# Patient Record
Sex: Male | Born: 1950 | Race: Black or African American | Hispanic: No | Marital: Single | Smoking: Never smoker
Health system: Southern US, Community
[De-identification: ages and names within clinical notes are randomized; demographics above are authoritative.]

## PROBLEM LIST (undated history)

## (undated) DIAGNOSIS — R350 Frequency of micturition: Secondary | ICD-10-CM

## (undated) DIAGNOSIS — R739 Hyperglycemia, unspecified: Secondary | ICD-10-CM

## (undated) DIAGNOSIS — H409 Unspecified glaucoma: Secondary | ICD-10-CM

## (undated) DIAGNOSIS — J690 Pneumonitis due to inhalation of food and vomit: Secondary | ICD-10-CM

## (undated) DIAGNOSIS — G4730 Sleep apnea, unspecified: Secondary | ICD-10-CM

## (undated) DIAGNOSIS — I10 Essential (primary) hypertension: Secondary | ICD-10-CM

## (undated) DIAGNOSIS — E785 Hyperlipidemia, unspecified: Secondary | ICD-10-CM

## (undated) DIAGNOSIS — Z8616 Personal history of COVID-19: Secondary | ICD-10-CM

## (undated) DIAGNOSIS — D649 Anemia, unspecified: Secondary | ICD-10-CM

## (undated) DIAGNOSIS — R972 Elevated prostate specific antigen [PSA]: Secondary | ICD-10-CM

## (undated) HISTORY — DX: Frequency of micturition: R35.0

## (undated) HISTORY — DX: Hyperlipidemia, unspecified: E78.5

## (undated) HISTORY — DX: Sleep apnea, unspecified: G47.30

## (undated) HISTORY — DX: Unspecified glaucoma: H40.9

## (undated) HISTORY — DX: Elevated prostate specific antigen (PSA): R97.20

## (undated) HISTORY — PX: COLONOSCOPY: SHX174

## (undated) HISTORY — DX: Hyperglycemia, unspecified: R73.9

## (undated) HISTORY — DX: Anemia, unspecified: D64.9

## (undated) HISTORY — DX: Essential (primary) hypertension: I10

## (undated) HISTORY — DX: Personal history of COVID-19: Z86.16

---

## 1997-12-28 ENCOUNTER — Other Ambulatory Visit: Admission: RE | Admit: 1997-12-28 | Discharge: 1997-12-28 | Payer: Self-pay | Admitting: Gastroenterology

## 2004-05-27 ENCOUNTER — Ambulatory Visit: Payer: Self-pay | Admitting: Family Medicine

## 2005-07-20 ENCOUNTER — Ambulatory Visit: Payer: Self-pay | Admitting: Internal Medicine

## 2005-07-21 ENCOUNTER — Ambulatory Visit: Payer: Self-pay | Admitting: Internal Medicine

## 2006-03-08 ENCOUNTER — Ambulatory Visit: Payer: Self-pay | Admitting: Family Medicine

## 2006-03-08 LAB — CONVERTED CEMR LAB: PSA: 0.79 ng/mL

## 2006-06-15 ENCOUNTER — Ambulatory Visit: Payer: Self-pay | Admitting: Gastroenterology

## 2006-11-23 ENCOUNTER — Encounter: Payer: Self-pay | Admitting: Family Medicine

## 2006-11-23 DIAGNOSIS — F79 Unspecified intellectual disabilities: Secondary | ICD-10-CM

## 2006-11-26 ENCOUNTER — Encounter (INDEPENDENT_AMBULATORY_CARE_PROVIDER_SITE_OTHER): Payer: Self-pay | Admitting: *Deleted

## 2006-12-10 ENCOUNTER — Ambulatory Visit: Payer: Self-pay | Admitting: Family Medicine

## 2006-12-10 DIAGNOSIS — I10 Essential (primary) hypertension: Secondary | ICD-10-CM | POA: Insufficient documentation

## 2006-12-11 LAB — CONVERTED CEMR LAB
ALT: 22 units/L (ref 0–53)
AST: 29 units/L (ref 0–37)
Cholesterol: 186 mg/dL (ref 0–200)
HDL: 35.4 mg/dL — ABNORMAL LOW (ref >39.0)
Hgb A1c MFr Bld: 5.7 % (ref 4.6–6.0)
LDL Cholesterol: 133 mg/dL — ABNORMAL HIGH (ref 0–99)
Total CHOL/HDL Ratio: 5.3
Triglycerides: 87 mg/dL (ref 0–149)
VLDL: 17 mg/dL (ref 0–40)

## 2007-01-14 ENCOUNTER — Ambulatory Visit: Payer: Self-pay | Admitting: Gastroenterology

## 2007-01-21 ENCOUNTER — Ambulatory Visit: Payer: Self-pay | Admitting: Gastroenterology

## 2007-01-25 ENCOUNTER — Ambulatory Visit: Payer: Self-pay | Admitting: Family Medicine

## 2007-05-17 ENCOUNTER — Ambulatory Visit: Payer: Self-pay | Admitting: Family Medicine

## 2007-05-20 LAB — CONVERTED CEMR LAB
ALT: 15 units/L (ref 0–53)
AST: 19 units/L (ref 0–37)
Albumin: 3.7 g/dL (ref 3.5–5.2)
BUN: 9 mg/dL (ref 6–23)
CO2: 27 meq/L (ref 19–32)
Calcium: 9.1 mg/dL (ref 8.4–10.5)
Chloride: 101 meq/L (ref 96–112)
Cholesterol: 172 mg/dL (ref 0–200)
Creatinine, Ser: 0.9 mg/dL (ref 0.4–1.5)
GFR calc Af Amer: 112 mL/min
GFR calc non Af Amer: 92 mL/min
Glucose, Bld: 103 mg/dL — ABNORMAL HIGH (ref 70–99)
HDL: 38 mg/dL — ABNORMAL LOW (ref >39.0)
LDL Cholesterol: 121 mg/dL — ABNORMAL HIGH (ref 0–99)
Phosphorus: 3.3 mg/dL (ref 2.3–4.6)
Potassium: 4.2 meq/L (ref 3.5–5.1)
Sodium: 134 meq/L — ABNORMAL LOW (ref 135–145)
Total CHOL/HDL Ratio: 4.5
Triglycerides: 67 mg/dL (ref 0–149)
VLDL: 13 mg/dL (ref 0–40)

## 2007-11-04 ENCOUNTER — Encounter: Payer: Self-pay | Admitting: Family Medicine

## 2007-12-11 ENCOUNTER — Telehealth (INDEPENDENT_AMBULATORY_CARE_PROVIDER_SITE_OTHER): Payer: Self-pay | Admitting: *Deleted

## 2009-02-08 ENCOUNTER — Telehealth: Payer: Self-pay | Admitting: Family Medicine

## 2009-08-16 ENCOUNTER — Ambulatory Visit: Payer: Self-pay | Admitting: Family Medicine

## 2009-08-18 LAB — CONVERTED CEMR LAB
ALT: 15 units/L (ref 0–53)
AST: 22 units/L (ref 0–37)
Albumin: 4.4 g/dL (ref 3.5–5.2)
Alkaline Phosphatase: 55 units/L (ref 39–117)
BUN: 14 mg/dL (ref 6–23)
Basophils Absolute: 0 10*3/uL (ref 0.0–0.1)
Basophils Relative: 0.5 % (ref 0.0–3.0)
Bilirubin, Direct: 0 mg/dL (ref 0.0–0.3)
CO2: 28 meq/L (ref 19–32)
Calcium: 8.9 mg/dL (ref 8.4–10.5)
Chloride: 100 meq/L (ref 96–112)
Cholesterol: 226 mg/dL — ABNORMAL HIGH (ref 0–200)
Creatinine, Ser: 1 mg/dL (ref 0.4–1.5)
Direct LDL: 175.3 mg/dL
Eosinophils Absolute: 0.1 10*3/uL (ref 0.0–0.7)
Eosinophils Relative: 3.2 % (ref 0.0–5.0)
GFR calc non Af Amer: 98.16 mL/min (ref >60)
Glucose, Bld: 88 mg/dL (ref 70–99)
HCT: 38.7 % — ABNORMAL LOW (ref 39.0–52.0)
HDL: 40.2 mg/dL (ref >39.00)
Hemoglobin: 13.3 g/dL (ref 13.0–17.0)
Hgb A1c MFr Bld: 6.2 % (ref 4.6–6.5)
Lymphocytes Relative: 35.1 % (ref 12.0–46.0)
Lymphs Abs: 1.4 10*3/uL (ref 0.7–4.0)
MCHC: 34.3 g/dL (ref 30.0–36.0)
MCV: 89.5 fL (ref 78.0–100.0)
Monocytes Absolute: 0.4 10*3/uL (ref 0.1–1.0)
Monocytes Relative: 10.8 % (ref 3.0–12.0)
Neutro Abs: 2 10*3/uL (ref 1.4–7.7)
Neutrophils Relative %: 50.4 % (ref 43.0–77.0)
PSA: 0.56 ng/mL (ref 0.10–4.00)
Phosphorus: 3.2 mg/dL (ref 2.3–4.6)
Platelets: 373 10*3/uL (ref 150.0–400.0)
Potassium: 3.6 meq/L (ref 3.5–5.1)
RBC: 4.32 M/uL (ref 4.22–5.81)
RDW: 13.8 % (ref 11.5–14.6)
Sodium: 137 meq/L (ref 135–145)
TSH: 2.55 microintl units/mL (ref 0.35–5.50)
Total Bilirubin: 0.5 mg/dL (ref 0.3–1.2)
Total CHOL/HDL Ratio: 6
Total Protein: 7.9 g/dL (ref 6.0–8.3)
Triglycerides: 147 mg/dL (ref 0.0–149.0)
VLDL: 29.4 mg/dL (ref 0.0–40.0)
WBC: 4 10*3/uL — ABNORMAL LOW (ref 4.5–10.5)

## 2009-11-05 ENCOUNTER — Ambulatory Visit: Payer: Self-pay | Admitting: Family Medicine

## 2009-11-06 LAB — CONVERTED CEMR LAB
ALT: 16 units/L (ref 0–53)
AST: 21 units/L (ref 0–37)
Cholesterol: 178 mg/dL (ref 0–200)
HDL: 43.6 mg/dL (ref >39.00)
Hgb A1c MFr Bld: 6.4 % (ref 4.6–6.5)
LDL Cholesterol: 119 mg/dL — ABNORMAL HIGH (ref 0–99)
Total CHOL/HDL Ratio: 4
Triglycerides: 77 mg/dL (ref 0.0–149.0)
VLDL: 15.4 mg/dL (ref 0.0–40.0)

## 2009-11-10 ENCOUNTER — Ambulatory Visit: Payer: Self-pay | Admitting: Family Medicine

## 2009-11-23 ENCOUNTER — Ambulatory Visit: Payer: Self-pay | Admitting: Family Medicine

## 2010-05-23 ENCOUNTER — Ambulatory Visit
Admission: RE | Admit: 2010-05-23 | Discharge: 2010-05-23 | Payer: Self-pay | Source: Home / Self Care | Attending: Family Medicine | Admitting: Family Medicine

## 2010-05-23 ENCOUNTER — Other Ambulatory Visit: Payer: Self-pay | Admitting: Family Medicine

## 2010-05-23 LAB — LIPID PANEL
Cholesterol: 186 mg/dL (ref 0–200)
HDL: 40 mg/dL (ref >39.00)
LDL Cholesterol: 128 mg/dL — ABNORMAL HIGH (ref 0–99)
Total CHOL/HDL Ratio: 5
Triglycerides: 92 mg/dL (ref 0.0–149.0)
VLDL: 18.4 mg/dL (ref 0.0–40.0)

## 2010-05-23 LAB — HEMOGLOBIN A1C: Hgb A1c MFr Bld: 6.5 % (ref 4.6–6.5)

## 2010-05-23 LAB — ALT: ALT: 16 U/L (ref 0–53)

## 2010-05-23 LAB — AST: AST: 24 U/L (ref 0–37)

## 2010-05-30 ENCOUNTER — Ambulatory Visit
Admission: RE | Admit: 2010-05-30 | Discharge: 2010-05-30 | Payer: Self-pay | Source: Home / Self Care | Attending: Family Medicine | Admitting: Family Medicine

## 2010-05-30 DIAGNOSIS — E119 Type 2 diabetes mellitus without complications: Secondary | ICD-10-CM | POA: Insufficient documentation

## 2010-06-09 NOTE — Assessment & Plan Note (Signed)
Summary: MED REFILL/DLO   Vital Signs:  Patient profile:   60 year old male Weight:      222.75 pounds Temp:     97.8 degrees F oral Pulse rate:   60 / minute Pulse rhythm:   regular BP sitting:   120 / 80  (left arm) Cuff size:   large  Vitals Entered By: Lewanda Rife LPN (August 16, 2009 12:10 PM)  History of Present Illness: here for f/u of HTN and lipids and other med problems   wt is about the same  eats all the time -- tries to eat healthy  eats cereal in the ams  is recently eating less sweets and junk food - his aunt is watching him since mother is in rehab facility after joint repl  is good about taking medicines -- the zestril and lopid -- missed one day    nocturia 1 time at night  no trouble passing urine   due for prostrate screen labs   colososc is due 2012 -- no blood in stool or bowel changes   chol has been stable with LDL 122  some walking to the mailbox  has been talking about walking for exercise  still has his dog   Allergies (verified): No Known Drug Allergies  Past History:  Past Surgical History: Last updated: 12/12/06 Colonoscop 1 polyp removed (12/1997) Colonoscopy- neg (04/2001)  Family History: Last updated: 12/12/2006 Father: deceased from blood clot, had prostate cancer Mother: colon cancer, chol, depression Siblings: none  Social History: Last updated: 12-12-2006 Marital Status: single Children: none Occupation:   Risk Factors: Smoking Status: never (Dec 12, 2006)  Past Medical History: Hyperlipidemia MR HTN hyperglycemia   Review of Systems General:  Denies fatigue, loss of appetite, and malaise. Eyes:  Denies blurring and eye irritation. CV:  Denies chest pain or discomfort, lightheadness, and palpitations. Resp:  Denies cough, shortness of breath, and wheezing. GI:  Denies indigestion, nausea, and vomiting. GU:  Denies dysuria, nocturia, urinary frequency, and urinary hesitancy. MS:  Denies joint pain, joint  redness, and joint swelling. Derm:  Denies lesion(s), poor wound healing, and rash. Neuro:  Denies headaches, numbness, and tingling. Psych:  Denies anxiety and depression. Endo:  Denies cold intolerance, excessive thirst, excessive urination, and heat intolerance. Heme:  Denies abnormal bruising and bleeding.  Physical Exam  General:  overwt but well appearing Head:  Normocephalic and atraumatic without obvious abnormalities. some balding without discrete areas of allopecia rash in beard has not aff hair growth Eyes:  vision grossly intact, pupils equal, pupils round, pupils reactive to light no conjunctival pallor, injection or icterus  Ears:  R ear normal and L ear normal.  -scant cerumen  Nose:  no nasal discharge.   Mouth:  pharynx pink and moist.   Neck:  supple with full rom and no masses or thyromegally, no JVD or carotid bruit  Chest Wall:  No deformities, masses, tenderness or gynecomastia noted. Lungs:  Normal respiratory effort, chest expands symmetrically. Lungs are clear to auscultation, no crackles or wheezes. Heart:  Normal rate and regular rhythm. S1 and S2 normal without gallop, murmur, click, rub or other extra sounds. Abdomen:  Bowel sounds positive,abdomen soft and non-tender without masses, organomegaly or hernias noted. no renal bruits  Msk:  No deformity or scoliosis noted of thoracic or lumbar spine.  no acute joint changes, full rom of all joints  Pulses:  R and L carotid,radial,femoral,dorsalis pedis and posterior tibial pulses are full and equal bilaterally Extremities:  No clubbing, cyanosis, edema, or deformity noted with normal full range of motion of all joints.   Neurologic:  sensation intact to light touch, gait normal, and DTRs symmetrical and normal.   Skin:  Intact without suspicious lesions or rashes Cervical Nodes:  No lymphadenopathy noted Inguinal Nodes:  No significant adenopathy Psych:  cheerful and generally quiet  answers questions  appropriately    Impression & Recommendations:  Problem # 1:  HYPERTENSION, BENIGN ESSENTIAL (ICD-401.1) Assessment Unchanged  has been very well controlled with current med so far disc healthy diet (low simple sugar/ choose complex carbs/ low sat fat) diet and exercise in detail  lab today and update Orders: Venipuncture (81191) TLB-Lipid Panel (80061-LIPID) TLB-Renal Function Panel (80069-RENAL) TLB-CBC Platelet - w/Differential (85025-CBCD) TLB-Hepatic/Liver Function Pnl (80076-HEPATIC) TLB-TSH (Thyroid Stimulating Hormone) (84443-TSH) TLB-PSA (Prostate Specific Antigen) (84153-PSA) TLB-A1C / Hgb A1C (Glycohemoglobin) (83036-A1C)  His updated medication list for this problem includes:    Zestril 5 Mg Tabs (Lisinopril) .Marland Kitchen... 1 by mouth daily  BP today: 120/80 Prior BP: 130/80 (01/25/2007)  Labs Reviewed: K+: 4.2 (05/17/2007) Creat: : 0.9 (05/17/2007)   Chol: 172 (05/17/2007)   HDL: 38.0 (05/17/2007)   LDL: 121 (05/17/2007)   TG: 67 (05/17/2007)  Problem # 2:  HYPERGLYCEMIA (ICD-790.29) Assessment: Unchanged  will check AIC with labs emph imp of low sugar diet/ avoid refined sweets  family voiced understanding  lab today Orders: Venipuncture (47829) TLB-Lipid Panel (80061-LIPID) TLB-Renal Function Panel (80069-RENAL) TLB-CBC Platelet - w/Differential (85025-CBCD) TLB-Hepatic/Liver Function Pnl (80076-HEPATIC) TLB-TSH (Thyroid Stimulating Hormone) (84443-TSH) TLB-PSA (Prostate Specific Antigen) (84153-PSA) TLB-A1C / Hgb A1C (Glycohemoglobin) (83036-A1C)  Labs Reviewed: Creat: 0.9 (05/17/2007)     Problem # 3:  HYPERLIPIDEMIA (ICD-272.4) Assessment: Unchanged  on lopid for trig disc low fat diet  lab and adv  re - emph imp of exercise  His updated medication list for this problem includes:    Lopid 600 Mg Tabs (Gemfibrozil) .Marland Kitchen... Take one by mouth two times a day  Orders: Venipuncture (56213) TLB-Lipid Panel (80061-LIPID) TLB-Renal Function Panel  (80069-RENAL) TLB-CBC Platelet - w/Differential (85025-CBCD) TLB-Hepatic/Liver Function Pnl (80076-HEPATIC) TLB-TSH (Thyroid Stimulating Hormone) (84443-TSH) TLB-PSA (Prostate Specific Antigen) (84153-PSA) TLB-A1C / Hgb A1C (Glycohemoglobin) (83036-A1C)  Labs Reviewed: SGOT: 19 (05/17/2007)   SGPT: 15 (05/17/2007)   HDL:38.0 (05/17/2007), 35.4 (12/10/2006)  LDL:121 (05/17/2007), 133 (12/10/2006)  Chol:172 (05/17/2007), 186 (12/10/2006)  Trig:67 (05/17/2007), 87 (12/10/2006)  Problem # 4:  SCREENING FOR MALIGNANT NEOPLASM, PROSTATE (ICD-V76.44) Assessment: Unchanged fam hx prostate ca  no symptoms at all check psa  avoid DRE unless necessary due to MR - and traumatic nature of it  disc red flags to watch for from urinary perspective  Orders: Venipuncture (08657) TLB-Lipid Panel (80061-LIPID) TLB-Renal Function Panel (80069-RENAL) TLB-CBC Platelet - w/Differential (85025-CBCD) TLB-Hepatic/Liver Function Pnl (80076-HEPATIC) TLB-TSH (Thyroid Stimulating Hormone) (84443-TSH) TLB-PSA (Prostate Specific Antigen) (84153-PSA) TLB-A1C / Hgb A1C (Glycohemoglobin) (83036-A1C)  Complete Medication List: 1)  Lopid 600 Mg Tabs (Gemfibrozil) .... Take one by mouth two times a day 2)  Zestril 5 Mg Tabs (Lisinopril) .Marland Kitchen.. 1 by mouth daily  Patient Instructions: 1)  watch diet for fat and sugar  2)  wear support hose when you can  3)  start walking every day -- taking someone with him  4)  labs today including prostate screen  5)  plan to follow up with me in a year - unless a problem arises   Current Allergies (reviewed today): No known allergies

## 2010-06-09 NOTE — Assessment & Plan Note (Signed)
Summary: F/U/CLE   Vital Signs:  Patient profile:   60 year old male Weight:      222.25 pounds Temp:     97.9 degrees F oral Pulse rate:   64 / minute Pulse rhythm:   regular BP sitting:   106 / 64  (left arm) Cuff size:   large  Vitals Entered By: Lewanda Rife LPN (November 23, 2009 11:53 AM) CC: follow-up visit   History of Present Illness: here for f/u of cholesterol and hyperglycemia and HTN is feeling ok overall   not doing too much - lost his dog (? if he will get another one)   bp is good today 106/64- has been well controlled  chol is in fair control with lopid trig 77, HDL 43 and LDL 119 (much imp from 170s) has made some changes in his eating -- monitored by his mother  eating the same low sugar and low fat    hyperglycemia - last AIC 6.2- now up to 6.4- borderline DM is trying to watch carbs and sugar -- but does eat some cookies  does eat some vegetables  walks around property occasionally/ the driveway  not as much in the hot weather   father hx of prostate ca no symptoms and psa is .72 thankfully  mother colon cancer his colonosc 2002 - ? due 5 years   Td? when     Allergies (verified): No Known Drug Allergies  Past History:  Past Medical History: Last updated: 08/16/2009 Hyperlipidemia MR HTN hyperglycemia   Past Surgical History: Last updated: 2006/11/24 Colonoscop 1 polyp removed (12/1997) Colonoscopy- neg (04/2001)  Family History: Last updated: 11/24/06 Father: deceased from blood clot, had prostate cancer Mother: colon cancer, chol, depression Siblings: none  Social History: Last updated: 11-24-2006 Marital Status: single Children: none Occupation:   Risk Factors: Smoking Status: never (11/24/2006)  Review of Systems General:  Denies fatigue, loss of appetite, and malaise. Eyes:  Denies blurring and eye irritation. CV:  Denies chest pain or discomfort, lightheadness, and palpitations. Resp:  Denies cough and  wheezing. GI:  Denies abdominal pain, change in bowel habits, indigestion, and nausea. GU:  Denies nocturia, urinary frequency, and urinary hesitancy. MS:  Denies joint pain, joint redness, joint swelling, muscle aches, and stiffness. Derm:  Denies itching, lesion(s), poor wound healing, and rash. Neuro:  Denies numbness and tingling. Psych:  Denies anxiety and depression. Endo:  Denies cold intolerance, excessive thirst, excessive urination, and heat intolerance. Heme:  Denies abnormal bruising and bleeding.  Physical Exam  General:  overwt but well appearing Head:  normocephalic, atraumatic, and no abnormalities observed.   Eyes:  vision grossly intact, pupils equal, pupils round, and pupils reactive to light.  no conjunctival pallor, injection or icterus  Mouth:  pharynx pink and moist.   Neck:  supple with full rom and no masses or thyromegally, no JVD or carotid bruit  Chest Wall:  No deformities, masses, tenderness or gynecomastia noted. Lungs:  Normal respiratory effort, chest expands symmetrically. Lungs are clear to auscultation, no crackles or wheezes. Heart:  Normal rate and regular rhythm. S1 and S2 normal without gallop, murmur, click, rub or other extra sounds. Abdomen:  Bowel sounds positive,abdomen soft and non-tender without masses, organomegaly or hernias noted. no renal bruits  Msk:  No deformity or scoliosis noted of thoracic or lumbar spine.   Pulses:  R and L carotid,radial,femoral,dorsalis pedis and posterior tibial pulses are full and equal bilaterally Extremities:  No clubbing, cyanosis, edema, or  deformity noted with normal full range of motion of all joints.   Neurologic:  sensation intact to light touch, gait normal, and DTRs symmetrical and normal.   Skin:  Intact without suspicious lesions or rashes Cervical Nodes:  No lymphadenopathy noted Psych:  pleasant and smiling  baseline MR answers most questions appropriately   Impression &  Recommendations:  Problem # 1:  HYPERTENSION, BENIGN ESSENTIAL (ICD-401.1) Assessment Unchanged  good control labs reviewed with pt and mother need for exercise discussed  His updated medication list for this problem includes:    Zestril 5 Mg Tabs (Lisinopril) .Marland Kitchen... 1 by mouth daily  BP today: 106/64 Prior BP: 120/80 (08/16/2009)  Labs Reviewed: K+: 3.6 (08/16/2009) Creat: : 1.0 (08/16/2009)   Chol: 178 (11/05/2009)   HDL: 43.60 (11/05/2009)   LDL: 119 (11/05/2009)   TG: 77.0 (11/05/2009)  Problem # 2:  HYPERGLYCEMIA (ICD-790.29) Assessment: Unchanged  AIC is borderline for DM at 6.4 disc need for lower carb diet and exercise for wt loss  his mother will work on it with him  lab and f/u planned 6 mo   Labs Reviewed: Creat: 1.0 (08/16/2009)     Problem # 3:  HYPERLIPIDEMIA (ICD-272.4) Assessment: Improved  this is much improved- rev labs with pt and his mother commended on better diet  re check 6 mo and f/u His updated medication list for this problem includes:    Lopid 600 Mg Tabs (Gemfibrozil) .Marland Kitchen... Take one by mouth two times a day  Labs Reviewed: SGOT: 21 (11/05/2009)   SGPT: 16 (11/05/2009)   HDL:43.60 (11/05/2009), 40.20 (08/16/2009)  LDL:119 (11/05/2009), 121 (05/17/2007)  Chol:178 (11/05/2009), 226 (08/16/2009)  Trig:77.0 (11/05/2009), 147.0 (08/16/2009)  Complete Medication List: 1)  Lopid 600 Mg Tabs (Gemfibrozil) .... Take one by mouth two times a day 2)  Zestril 5 Mg Tabs (Lisinopril) .Marland Kitchen.. 1 by mouth daily  Other Orders: TD Toxoids IM 7 YR + (41324) Admin 1st Vaccine (40102) Admin 1st Vaccine Horticulturist, commercial) [phone])  Patient Instructions: 1)  please send for last colonoscpy report from Dr Jarold Motto (and recommendation for the next one )  2)  tetnus shot today  3)  try to get at least 20 minutes of exercise per day- inside or out  4)  tetnus shot today  5)  schedule fasting labs in 6 months and then follow up lipid/ast/alt/ AIC 250.0   Current Allergies  (reviewed today): No known allergies      Tetanus/Td Vaccine    Vaccine Type: Td    Site: left deltoid    Mfr: Sanofi Pasteur    Dose: 0.5 ml    Route: IM    Given by: Lewanda Rife LPN    Exp. Date: 06/09/2011    Lot #: [id number]    VIS given: 03/26/07 version given November 23, 2009.   Prevention & Chronic Care Immunizations   Influenza vaccine: Not documented    Tetanus booster: 11/23/2009: Td    Pneumococcal vaccine: Not documented  Colorectal Screening   Hemoccult: Not documented    Colonoscopy: normal  (04/07/2001)  Other Screening   PSA: 0.56  (08/16/2009)   Smoking status: never  (11/23/2006)  Lipids   Total Cholesterol: 178  (11/05/2009)   LDL: 119  (11/05/2009)   LDL Direct: 175.3  (08/16/2009)   HDL: 43.60  (11/05/2009)   Triglycerides: 77.0  (11/05/2009)    SGOT (AST): 21  (11/05/2009)   SGPT (ALT): 16  (11/05/2009)   Alkaline phosphatase: 55  (08/16/2009)   Total bilirubin:  0.5  (08/16/2009)  Hypertension   Last Blood Pressure: 106 / 64  (11/23/2009)   Serum creatinine: 1.0  (08/16/2009)   Serum potassium 3.6  (08/16/2009)  Self-Management Support :    Hypertension self-management support: Not documented    Lipid self-management support: Not documented

## 2010-06-15 NOTE — Assessment & Plan Note (Signed)
Summary: ROA FOR 6 MONTH FOLLOW-UP/JRR   Vital Signs:  Patient profile:   60 year old male Weight:      227.75 pounds Temp:     98.5 degrees F oral Pulse rate:   68 / minute Pulse rhythm:   regular BP sitting:   90 / 62  (left arm) Cuff size:   large  Vitals Entered By: Lewanda Rife LPN (May 30, 2010 2:19 PM) CC: six month f/u   History of Present Illness: here for f/u of HTN and lipids and hyperglycemia  here with neighbor today - mother is sick   is doing welll  nothing new - feels good   he ate a lot during the holidays -- holiday meals / sweet potato/ beans  snacks a lot - per his neighbor  does eat sweets quite a lot     wt is up 5 lb  HTN is very well controlled 90/62  hyperglycemia AIC is 6.5  up from 6.4   lipids are up also a bit trig 92 and HDL 40 and LDL 128 (up from 119)  does not do any exercise   he could rake gravel (but not in winter )  needs indoor exercise      Allergies (verified): No Known Drug Allergies  Past History:  Past Surgical History: Last updated: 12-21-06 Colonoscop 1 polyp removed (12/1997) Colonoscopy- neg (04/2001)  Family History: Last updated: 21-Dec-2006 Father: deceased from blood clot, had prostate cancer Mother: colon cancer, chol, depression Siblings: none  Social History: Last updated: 12/21/2006 Marital Status: single Children: none Occupation:   Risk Factors: Smoking Status: never (12-21-06)  Past Medical History: Hyperlipidemia MR HTN DM2   Review of Systems General:  Denies fatigue, loss of appetite, and malaise. Eyes:  Denies blurring and eye irritation. CV:  Denies chest pain or discomfort, palpitations, and shortness of breath with exertion. Resp:  Denies cough, shortness of breath, and wheezing. GI:  Denies abdominal pain, change in bowel habits, indigestion, and nausea. GU:  Denies dysuria and urinary frequency. MS:  Denies joint pain, joint swelling, and cramps. Derm:  Denies  itching, lesion(s), poor wound healing, and rash. Neuro:  Denies headaches, numbness, and tingling. Psych:  mood is about the same as usual . Endo:  Denies cold intolerance, excessive thirst, excessive urination, and heat intolerance. Heme:  Denies abnormal bruising and bleeding.  Physical Exam  General:  overwt but well appearing Head:  normocephalic, atraumatic, and no abnormalities observed.   Eyes:  vision grossly intact, pupils equal, pupils round, and pupils reactive to light.  no conjunctival pallor, injection or icterus  Mouth:  pharynx pink and moist.   Neck:  supple with full rom and no masses or thyromegally, no JVD or carotid bruit  Chest Wall:  No deformities, masses, tenderness or gynecomastia noted. Lungs:  Normal respiratory effort, chest expands symmetrically. Lungs are clear to auscultation, no crackles or wheezes. Heart:  Normal rate and regular rhythm. S1 and S2 normal without gallop, murmur, click, rub or other extra sounds. Abdomen:  Bowel sounds positive,abdomen soft and non-tender without masses, organomegaly or hernias noted. no renal bruits  Msk:  No deformity or scoliosis noted of thoracic or lumbar spine.   Pulses:  R and L carotid,radial,femoral,dorsalis pedis and posterior tibial pulses are full and equal bilaterally Extremities:  No clubbing, cyanosis, edema, or deformity noted with normal full range of motion of all joints.   Neurologic:  sensation intact to light touch, gait normal, and DTRs  symmetrical and normal.   Skin:  Intact without suspicious lesions or rashes Cervical Nodes:  No lymphadenopathy noted Inguinal Nodes:  No significant adenopathy Psych:  pleasant and smiling  baseline MR answers most questions appropriately here with neighbor Nelta Numbers who is helpful and supportive today   Impression & Recommendations:  Problem # 1:  HYPERTENSION, BENIGN ESSENTIAL (ICD-401.1) Assessment Unchanged  this is in very good control with low dose  ace no changes lab reviewed  His updated medication list for this problem includes:    Zestril 5 Mg Tabs (Lisinopril) .Marland Kitchen... 1 by mouth daily  BP today: 90/62 Prior BP: 106/64 (11/23/2009)  Labs Reviewed: K+: 3.6 (08/16/2009) Creat: : 1.0 (08/16/2009)   Chol: 186 (05/23/2010)   HDL: 40.00 (05/23/2010)   LDL: 128 (05/23/2010)   TG: 92.0 (05/23/2010)  Problem # 2:  HYPERGLYCEMIA (ICD-790.29) Assessment: Deteriorated this has progressed to DM with AIC of 6.5  disc implications of this/ health risks and given handouts from [facility] on DM and also exercise again disc carefully need to cut out snacks/ sweets/ sweet drinks and again work on wt loss  plan made to exercise in the house when it is cold outside plan to re check AIC in 3 mo and then follow up   Problem # 3:  HYPERLIPIDEMIA (ICD-272.4) Assessment: Deteriorated  while trig are well controlled with lopid- LDL is still too high and climbing disc low sat fat diet  re check 3 mo and f/u may need to add statin  His updated medication list for this problem includes:    Lopid 600 Mg Tabs (Gemfibrozil) .Marland Kitchen... Take one by mouth two times a day  Labs Reviewed: SGOT: 24 (05/23/2010)   SGPT: 16 (05/23/2010)   HDL:40.00 (05/23/2010), 43.60 (11/05/2009)  LDL:128 (05/23/2010), 119 (11/05/2009)  Chol:186 (05/23/2010), 178 (11/05/2009)  Trig:92.0 (05/23/2010), 77.0 (11/05/2009)  Complete Medication List: 1)  Lopid 600 Mg Tabs (Gemfibrozil) .... Take one by mouth two times a day 2)  Zestril 5 Mg Tabs (Lisinopril) .Marland Kitchen.. 1 by mouth daily 3)  Terbinafine Hcl 250 Mg Tabs (Terbinafine hcl) .... Take 1 tablet by mouth once a day  Patient Instructions: 1)  work hard on low sugar and low fat diet  2)  work hard on weight loss 3)  consider exercise indoors in the winter --- a video in TV or exercise equiptment if you have it  4)  stop snacking 5)  stop sweets 6)  no sweet drinks -- no soda or juice or sweet tea  7)  sugar free drinks are ok  8)   schedule fasting lab and follow up in 3 months lipid/ast/alt/ AIC 272 and 250.0   Orders Added: 1)  Est. Patient Level IV [81191]    Current Allergies (reviewed today): No known allergies

## 2010-09-05 ENCOUNTER — Other Ambulatory Visit: Payer: Self-pay | Admitting: Family Medicine

## 2010-10-07 ENCOUNTER — Other Ambulatory Visit: Payer: Self-pay | Admitting: *Deleted

## 2010-10-07 MED ORDER — LISINOPRIL 5 MG PO TABS: 5.0000 mg | ORAL_TABLET | Freq: Every day | ORAL | Status: DC

## 2011-03-24 ENCOUNTER — Other Ambulatory Visit: Payer: Self-pay | Admitting: Family Medicine

## 2011-05-01 ENCOUNTER — Other Ambulatory Visit: Payer: Self-pay | Admitting: Family Medicine

## 2011-05-03 NOTE — Telephone Encounter (Signed)
[facility] [address] request refill Lopid 600 mg #60 x 5.

## 2011-05-18 ENCOUNTER — Other Ambulatory Visit: Payer: Self-pay | Admitting: Family Medicine

## 2011-05-18 NOTE — Telephone Encounter (Signed)
[facility] [address] request refill lisinopril 5 mg. Pt last seen 05/30/10.Please advise.

## 2011-05-18 NOTE — Telephone Encounter (Signed)
Schedule check up winter or spring please Will refill electronically

## 2011-05-18 NOTE — Telephone Encounter (Signed)
Jamie when Dr Milinda Antis says check up she does mean a 30 min appt. Thank you. Rena

## 2011-05-19 NOTE — Telephone Encounter (Signed)
Spoke with pt and set up CPE in April with Dr. Milinda Antis.

## 2011-08-10 ENCOUNTER — Telehealth: Payer: Self-pay | Admitting: Family Medicine

## 2011-08-10 DIAGNOSIS — E119 Type 2 diabetes mellitus without complications: Secondary | ICD-10-CM

## 2011-08-10 DIAGNOSIS — Z0000 Encounter for general adult medical examination without abnormal findings: Secondary | ICD-10-CM | POA: Insufficient documentation

## 2011-08-10 DIAGNOSIS — Z125 Encounter for screening for malignant neoplasm of prostate: Secondary | ICD-10-CM | POA: Insufficient documentation

## 2011-08-10 DIAGNOSIS — I10 Essential (primary) hypertension: Secondary | ICD-10-CM

## 2011-08-10 DIAGNOSIS — E785 Hyperlipidemia, unspecified: Secondary | ICD-10-CM

## 2011-08-10 NOTE — Telephone Encounter (Signed)
Message copied by [name] on Thu Aug 10, 2011  5:33 PM ------      Message from: Alvina Chou      Created: Thu Aug 03, 2011  3:58 PM      Regarding: lab orders for 4.5.13       Patient is scheduled for CPX labs, please order future labs, Thanks , Camelia Eng

## 2011-08-11 ENCOUNTER — Other Ambulatory Visit (INDEPENDENT_AMBULATORY_CARE_PROVIDER_SITE_OTHER): Payer: Medicare Other

## 2011-08-11 DIAGNOSIS — E119 Type 2 diabetes mellitus without complications: Secondary | ICD-10-CM

## 2011-08-11 DIAGNOSIS — Z125 Encounter for screening for malignant neoplasm of prostate: Secondary | ICD-10-CM | POA: Diagnosis not present

## 2011-08-11 DIAGNOSIS — E785 Hyperlipidemia, unspecified: Secondary | ICD-10-CM | POA: Diagnosis not present

## 2011-08-11 DIAGNOSIS — I10 Essential (primary) hypertension: Secondary | ICD-10-CM | POA: Diagnosis not present

## 2011-08-11 DIAGNOSIS — Z0000 Encounter for general adult medical examination without abnormal findings: Secondary | ICD-10-CM

## 2011-08-11 LAB — CBC WITH DIFFERENTIAL/PLATELET
Basophils Absolute: 0 10*3/uL (ref 0.0–0.1)
Basophils Relative: 0.5 % (ref 0.0–3.0)
Eosinophils Absolute: 0.1 10*3/uL (ref 0.0–0.7)
Hemoglobin: 12.7 g/dL — ABNORMAL LOW (ref 13.0–17.0)
Lymphs Abs: 1.4 10*3/uL (ref 0.7–4.0)
MCHC: 33.7 g/dL (ref 30.0–36.0)
MCV: 89.8 fl (ref 78.0–100.0)
Monocytes Absolute: 0.3 10*3/uL (ref 0.1–1.0)
Neutro Abs: 1.5 10*3/uL (ref 1.4–7.7)
RBC: 4.2 Mil/uL — ABNORMAL LOW (ref 4.22–5.81)
RDW: 14.3 % (ref 11.5–14.6)

## 2011-08-11 LAB — LIPID PANEL
Cholesterol: 194 mg/dL (ref 0–200)
HDL: 40.5 mg/dL (ref >39.00)
LDL Cholesterol: 141 mg/dL — ABNORMAL HIGH (ref 0–99)
Triglycerides: 64 mg/dL (ref 0.0–149.0)
VLDL: 12.8 mg/dL (ref 0.0–40.0)

## 2011-08-11 LAB — COMPREHENSIVE METABOLIC PANEL
ALT: 14 U/L (ref 0–53)
AST: 18 U/L (ref 0–37)
Alkaline Phosphatase: 60 U/L (ref 39–117)
BUN: 15 mg/dL (ref 6–23)
Calcium: 9.4 mg/dL (ref 8.4–10.5)
Creatinine, Ser: 1 mg/dL (ref 0.4–1.5)
Total Bilirubin: 0.6 mg/dL (ref 0.3–1.2)

## 2011-08-18 ENCOUNTER — Ambulatory Visit (INDEPENDENT_AMBULATORY_CARE_PROVIDER_SITE_OTHER): Payer: Medicare Other | Admitting: Family Medicine

## 2011-08-18 ENCOUNTER — Encounter: Payer: Self-pay | Admitting: Family Medicine

## 2011-08-18 VITALS — BP 130/70 | HR 74 | Temp 97.7°F | Ht 67.0 in | Wt 228.8 lb

## 2011-08-18 DIAGNOSIS — Z1211 Encounter for screening for malignant neoplasm of colon: Secondary | ICD-10-CM

## 2011-08-18 DIAGNOSIS — I10 Essential (primary) hypertension: Secondary | ICD-10-CM | POA: Diagnosis not present

## 2011-08-18 DIAGNOSIS — E785 Hyperlipidemia, unspecified: Secondary | ICD-10-CM | POA: Diagnosis not present

## 2011-08-18 DIAGNOSIS — E669 Obesity, unspecified: Secondary | ICD-10-CM | POA: Insufficient documentation

## 2011-08-18 DIAGNOSIS — Z125 Encounter for screening for malignant neoplasm of prostate: Secondary | ICD-10-CM | POA: Diagnosis not present

## 2011-08-18 DIAGNOSIS — E119 Type 2 diabetes mellitus without complications: Secondary | ICD-10-CM | POA: Diagnosis not present

## 2011-08-18 HISTORY — DX: Encounter for screening for malignant neoplasm of colon: Z12.11

## 2011-08-18 NOTE — Progress Notes (Signed)
Subjective:    Patient ID: Daniel Bailey, male    DOB: Dec 29, 1950, 61 y.o.   MRN: [id number]  HPI Here for check up of chronic medical conditions and to review health mt list  Is doing well  Nothing new going on per pt Mother here with him has no concerns   Has been sitting around eating  Also walking -- 2-3 days per week - to end of the road and back 5-8 minutes  His mother has been trying to get a used treadmill    Wt is stable/ obese with bmi of 35  bp is 130/70     Today No cp or palpitations or headaches or edema  No side effects to medicines    Hb is 12.7- that is down from 13.3  Colon cancer screening - has been a while - has not had a notice (5 year recall)  Mother had colon cancer  Will do a stool card today  No change in bowel habits/ no blood in stool and no abdominal pain   DM- is diet controlled a1c is 6.3- technically hyperglycemia  Is pretty good about staying away from sugar and also chooses all whole grain carbs -- eats with his mother who is also diabetic   psa .62- re assuring Prostate Nocturia one time per night   Zoster status- will check out coverage  Flu shot - did not get a flu shot -- will get in the future   On lopid Chol is up a bit Lab Results  Component Value Date   CHOL 194 08/11/2011   CHOL 186 05/23/2010   CHOL 178 11/05/2009   Lab Results  Component Value Date   HDL 40.50 08/11/2011   HDL 40.00 05/23/2010   HDL 40.98 11/05/2009   Lab Results  Component Value Date   LDLCALC 141* 08/11/2011   LDLCALC 128* 05/23/2010   LDLCALC 119* 11/05/2009   Lab Results  Component Value Date   TRIG 64.0 08/11/2011   TRIG 92.0 05/23/2010   TRIG 77.0 11/05/2009   Lab Results  Component Value Date   CHOLHDL 5 08/11/2011   CHOLHDL 5 05/23/2010   CHOLHDL 4 11/05/2009   Lab Results  Component Value Date   LDLDIRECT 175.3 08/16/2009   once in a while - fried chicken / red meat occ/ not that often  No bacon or sausage , some eggs   Patient Active Problem List    Diagnoses  . HYPERLIPIDEMIA  . MENTAL RETARDATION  . HYPERTENSION, BENIGN ESSENTIAL  . Hyperglycemia  . Routine general medical examination at a health care facility  . Prostate cancer screening  . Obesity  . Colon cancer screening   No past medical history on file. No past surgical history on file. History  Substance Use Topics  . Smoking status: Never Smoker   . Smokeless tobacco: Not on file  . Alcohol Use: Not on file   No family history on file. No Known Allergies Current Outpatient Prescriptions on File Prior to Visit  Medication Sig Dispense Refill  . gemfibrozil (LOPID) 600 MG tablet TAKE ONE TABLET BY MOUTH TWICE DAILY  60 tablet  5  . lisinopril (PRINIVIL,ZESTRIL) 5 MG tablet TAKE ONE TABLET BY MOUTH EVERY DAY  30 tablet  5     Review of Systems Review of Systems  Constitutional: Negative for fever, appetite change, fatigue and unexpected weight change.  Eyes: Negative for pain and visual disturbance.  Respiratory: Negative for cough and shortness of breath.  Cardiovascular: Negative for cp or palpitations    Gastrointestinal: Negative for nausea, diarrhea and constipation.  Genitourinary: Negative for urgency and frequency.  Skin: Negative for pallor or rash   Neurological: Negative for weakness, light-headedness, numbness and headaches.  Hematological: Negative for adenopathy. Does not bruise/bleed easily.  Psychiatric/Behavioral: Negative for dysphoric mood. The patient is not nervous/anxious.          Objective:   Physical Exam  Constitutional: He appears well-developed and well-nourished. No distress.       Obese and well appearing   HENT:  Head: Normocephalic and atraumatic.  Right Ear: External ear normal.  Left Ear: External ear normal.  Nose: Nose normal.  Mouth/Throat: Oropharynx is clear and moist.  Eyes: Conjunctivae and EOM are normal. Pupils are equal, round, and reactive to light. No scleral icterus.  Neck: Normal range of motion. Neck  supple. No JVD present. Carotid bruit is not present. No thyromegaly present.  Cardiovascular: Normal rate, regular rhythm, normal heart sounds and intact distal pulses.  Exam reveals no gallop.   Pulmonary/Chest: Breath sounds normal. No respiratory distress. He has no wheezes.  Abdominal: Soft. Bowel sounds are normal. He exhibits no distension, no abdominal bruit and no mass. There is no tenderness.  Musculoskeletal: Normal range of motion. He exhibits no edema and no tenderness.  Lymphadenopathy:    He has no cervical adenopathy.  Neurological: He is alert. He has normal reflexes. No cranial nerve deficit. He exhibits normal muscle tone. Coordination normal.  Skin: Skin is warm and dry. No rash noted. No erythema. No pallor.  Psychiatric: He has a normal mood and affect.       Baseline MR - is content and cheerful today His mother helps with history          Assessment & Plan:

## 2011-08-18 NOTE — Assessment & Plan Note (Signed)
psa stable No symptoms  Did not do DRE today in light of pt's mental status - but would consider if symptomatic

## 2011-08-18 NOTE — Assessment & Plan Note (Signed)
Trig well controlled but LDL up Disc goals for lipids and reasons to control them Rev labs with pt Rev low sat fat diet in detail  Continue lopid Re check 6 mo with diet change

## 2011-08-18 NOTE — Assessment & Plan Note (Signed)
Slight anemia  IFOB Is up to date on colonosc - has them every 5 years since mother had colon cancer

## 2011-08-18 NOTE — Assessment & Plan Note (Signed)
Discussed how this problem influences overall health and the risks it imposes  Reviewed plan for weight loss with lower calorie diet (via better food choices and also portion control or program like weight watchers) and exercise building up to or more than 30 minutes 5 days per week including some aerobic activity    

## 2011-08-18 NOTE — Patient Instructions (Addendum)
Avoid red meat/ fried foods/ egg yolks/ fatty breakfast meats/ butter, cheese and high fat dairy/ and shellfish  For cholesterol Follow up in 6 months with labs prior  Try "play it again sports" for a used treadmill  Or consider joining to Y if affordable  Try to stay as active as you can - increase walking to 20 minutes if you can  Try doing the stool card for colon cancer screening  No change in medicines  If you are interested in a shingles/zoster vaccine - call your insurance to check on coverage,( you should not get it within 1 month of other vaccines) , then call us for a prescription  for it to take to a pharmacy that gives the shot

## 2011-08-18 NOTE — Assessment & Plan Note (Signed)
bp in fair control at this time  No changes needed  Disc lifstyle change with low sodium diet and exercise   

## 2011-08-18 NOTE — Assessment & Plan Note (Signed)
This is stable with a1c 6.3 Does well with low glycemic diet but needs to work on wt loss  Disc plan for exercise

## 2011-09-05 ENCOUNTER — Telehealth: Payer: Self-pay

## 2011-09-05 NOTE — Telephone Encounter (Signed)
Tell her medium  If she cannot get them -- let me know and I will write a px for them thanks

## 2011-09-05 NOTE — Telephone Encounter (Signed)
LMOVM in detail.  Asked her to return call if she has any questions or needs the Rx.

## 2011-09-05 NOTE — Telephone Encounter (Signed)
Daniel Bailey left v/m Dr Milinda Antis advised pt to get support hose and Daniel Bailey needs to know if should be medium or firm support.Daniel Bailey can be reached [phone].Please advise.

## 2011-09-26 DIAGNOSIS — M79609 Pain in unspecified limb: Secondary | ICD-10-CM | POA: Diagnosis not present

## 2011-09-26 DIAGNOSIS — B351 Tinea unguium: Secondary | ICD-10-CM | POA: Diagnosis not present

## 2011-10-31 DIAGNOSIS — H40019 Open angle with borderline findings, low risk, unspecified eye: Secondary | ICD-10-CM | POA: Diagnosis not present

## 2011-11-20 ENCOUNTER — Other Ambulatory Visit: Payer: Self-pay | Admitting: Family Medicine

## 2011-11-20 ENCOUNTER — Other Ambulatory Visit: Payer: Self-pay

## 2011-11-20 MED ORDER — LISINOPRIL 5 MG PO TABS: 5.0000 mg | ORAL_TABLET | Freq: Every day | ORAL | Status: DC

## 2011-11-20 NOTE — Telephone Encounter (Signed)
Ok to refill 

## 2011-11-20 NOTE — Telephone Encounter (Signed)
Px written for call in   

## 2011-11-28 ENCOUNTER — Other Ambulatory Visit: Payer: Self-pay | Admitting: Family Medicine

## 2011-11-28 NOTE — Telephone Encounter (Signed)
Ok to refill? Last OV was 08/18/11

## 2011-11-28 NOTE — Telephone Encounter (Signed)
Will refill electronically  

## 2011-12-01 DIAGNOSIS — H25019 Cortical age-related cataract, unspecified eye: Secondary | ICD-10-CM | POA: Diagnosis not present

## 2011-12-01 DIAGNOSIS — H401290 Low-tension glaucoma, unspecified eye, stage unspecified: Secondary | ICD-10-CM | POA: Diagnosis not present

## 2012-01-26 DIAGNOSIS — H5319 Other subjective visual disturbances: Secondary | ICD-10-CM | POA: Diagnosis not present

## 2012-01-26 DIAGNOSIS — H401290 Low-tension glaucoma, unspecified eye, stage unspecified: Secondary | ICD-10-CM | POA: Diagnosis not present

## 2012-02-12 ENCOUNTER — Other Ambulatory Visit (INDEPENDENT_AMBULATORY_CARE_PROVIDER_SITE_OTHER): Payer: Medicare Other

## 2012-02-12 DIAGNOSIS — E119 Type 2 diabetes mellitus without complications: Secondary | ICD-10-CM | POA: Diagnosis not present

## 2012-02-12 DIAGNOSIS — E785 Hyperlipidemia, unspecified: Secondary | ICD-10-CM | POA: Diagnosis not present

## 2012-02-12 DIAGNOSIS — I10 Essential (primary) hypertension: Secondary | ICD-10-CM | POA: Diagnosis not present

## 2012-02-12 LAB — CBC WITH DIFFERENTIAL/PLATELET
Eosinophils Absolute: 0.1 10*3/uL (ref 0.0–0.7)
Lymphocytes Relative: 45 % (ref 12.0–46.0)
MCHC: 33.7 g/dL (ref 30.0–36.0)
MCV: 89 fl (ref 78.0–100.0)
Monocytes Absolute: 0.3 10*3/uL (ref 0.1–1.0)
Neutrophils Relative %: 44 % (ref 43.0–77.0)
Platelets: 336 10*3/uL (ref 150.0–400.0)
WBC: 3.6 10*3/uL — ABNORMAL LOW (ref 4.5–10.5)

## 2012-02-12 LAB — ALT: ALT: 12 U/L (ref 0–53)

## 2012-02-12 LAB — LIPID PANEL
HDL: 37.3 mg/dL — ABNORMAL LOW (ref >39.00)
Total CHOL/HDL Ratio: 4
VLDL: 14.6 mg/dL (ref 0.0–40.0)

## 2012-02-12 LAB — AST: AST: 20 U/L (ref 0–37)

## 2012-02-19 ENCOUNTER — Ambulatory Visit (INDEPENDENT_AMBULATORY_CARE_PROVIDER_SITE_OTHER): Payer: Medicare Other | Admitting: Family Medicine

## 2012-02-19 ENCOUNTER — Encounter: Payer: Self-pay | Admitting: Family Medicine

## 2012-02-19 VITALS — BP 124/72 | HR 72 | Temp 98.3°F | Ht 67.0 in | Wt 223.0 lb

## 2012-02-19 DIAGNOSIS — Z23 Encounter for immunization: Secondary | ICD-10-CM

## 2012-02-19 DIAGNOSIS — E785 Hyperlipidemia, unspecified: Secondary | ICD-10-CM

## 2012-02-19 DIAGNOSIS — I10 Essential (primary) hypertension: Secondary | ICD-10-CM | POA: Diagnosis not present

## 2012-02-19 DIAGNOSIS — R739 Hyperglycemia, unspecified: Secondary | ICD-10-CM

## 2012-02-19 DIAGNOSIS — R7309 Other abnormal glucose: Secondary | ICD-10-CM

## 2012-02-19 NOTE — Patient Instructions (Signed)
Flu shot today  Keep working on weight loss - find some exercise you can do in the house! Also keep watching fat and sugar in your diet  Numbers are all looking better  Follow up in 6 months for an annual exam with labs prior

## 2012-02-19 NOTE — Progress Notes (Signed)
Subjective:    Patient ID: Daniel Bailey, male    DOB: 03/11/1951, 61 y.o.   MRN: [id number]  HPI Here for f/u of chronic conditions  Is doing well overall  No new problems   bp is stable today  No cp or palpitations or headaches or edema  No side effects to medicines  BP Readings from Last 3 Encounters:  02/19/12 124/72  08/18/11 130/70  05/30/10 90/62      Last visit chol was up  Lab Results  Component Value Date   CHOL 165 02/12/2012   CHOL 194 08/11/2011   CHOL 186 05/23/2010   Lab Results  Component Value Date   HDL 37.30* 02/12/2012   HDL 40.50 08/11/2011   HDL 40.00 05/23/2010   Lab Results  Component Value Date   LDLCALC 113* 02/12/2012   LDLCALC 141* 08/11/2011   LDLCALC 128* 05/23/2010   Lab Results  Component Value Date   TRIG 73.0 02/12/2012   TRIG 64.0 08/11/2011   TRIG 92.0 05/23/2010   Lab Results  Component Value Date   CHOLHDL 4 02/12/2012   CHOLHDL 5 08/11/2011   CHOLHDL 5 05/23/2010   Lab Results  Component Value Date   LDLDIRECT 175.3 08/16/2009   quite improved today Per pt he still watches too much TV Is eating cheerios instead of classic [place] bkfast Also changed to low fat almond milk Is eating fruit and veg No beef or fried foods  Not really getting any exercise  Does not get out in bad weather  Mother is looking for a disc for chair exercise - plans on that   Also wt is down 5 lb with bmi of 34 Also cutting portions   Hyperglycemia also slt improved Is staying away from sugar  Lab Results  Component Value Date   HGBA1C 6.2 02/12/2012     Patient Active Problem List  Diagnosis  . HYPERLIPIDEMIA  . MENTAL RETARDATION  . HYPERTENSION, BENIGN ESSENTIAL  . Hyperglycemia  . Routine general medical examination at a health care facility  . Prostate cancer screening  . Obesity  . Colon cancer screening   No past medical history on file. No past surgical history on file. History  Substance Use Topics  . Smoking status: Never Smoker     . Smokeless tobacco: Not on file  . Alcohol Use: Not on file   No family history on file. No Known Allergies Current Outpatient Prescriptions on File Prior to Visit  Medication Sig Dispense Refill  . gemfibrozil (LOPID) 600 MG tablet TAKE ONE TABLET BY MOUTH TWICE DAILY  60 tablet  11  . lisinopril (PRINIVIL,ZESTRIL) 5 MG tablet Take 1 tablet (5 mg total) by mouth daily.  30 tablet  11     Review of Systems    Review of Systems  Constitutional: Negative for fever, appetite change, fatigue and unexpected weight change.  Eyes: Negative for pain and visual disturbance.  Respiratory: Negative for cough and shortness of breath.   Cardiovascular: Negative for cp or palpitations    Gastrointestinal: Negative for nausea, diarrhea and constipation.  Genitourinary: Negative for urgency and frequency.  Skin: Negative for pallor or rash   Neurological: Negative for weakness, light-headedness, numbness and headaches.  Hematological: Negative for adenopathy. Does not bruise/bleed easily.  Psychiatric/Behavioral: Negative for dysphoric mood. The patient is not nervous/anxious.      Objective:   Physical Exam  Constitutional: He appears well-developed and well-nourished. No distress.  HENT:  Head: Normocephalic and atraumatic.  Mouth/Throat: Oropharynx is clear and moist.  Eyes: Conjunctivae normal and EOM are normal. Pupils are equal, round, and reactive to light. No scleral icterus.  Neck: Normal range of motion. Neck supple. No JVD present. Carotid bruit is not present. No thyromegaly present.  Cardiovascular: Normal rate, regular rhythm, normal heart sounds and intact distal pulses.  Exam reveals no gallop.   Pulmonary/Chest: Effort normal and breath sounds normal. No respiratory distress. He has no wheezes.  Abdominal: Soft. Bowel sounds are normal. He exhibits no distension, no abdominal bruit and no mass. There is no tenderness.  Musculoskeletal: He exhibits no edema.  Lymphadenopathy:     He has no cervical adenopathy.  Neurological: He is alert. He has normal reflexes. No cranial nerve deficit. He exhibits normal muscle tone. Coordination normal.  Skin: Skin is warm and dry. No rash noted. No erythema. No pallor.  Psychiatric: He has a normal mood and affect.       Baseline MR - pleasant and answers questions appropriately          Assessment & Plan:

## 2012-02-21 ENCOUNTER — Encounter: Payer: Self-pay | Admitting: Gastroenterology

## 2012-02-25 NOTE — Assessment & Plan Note (Signed)
bp in fair control at this time  No changes needed  Disc lifstyle change with low sodium diet and exercise  bp is stable today  No cp or palpitations or headaches or edema  No side effects to medicines  BP Readings from Last 3 Encounters:  02/19/12 124/72  08/18/11 130/70  05/30/10 90/62

## 2012-02-25 NOTE — Assessment & Plan Note (Signed)
Disc goals for lipids and reasons to control them Rev labs with pt Rev low sat fat diet in detail  Improved-commended  

## 2012-02-25 NOTE — Assessment & Plan Note (Signed)
Lab Results  Component Value Date   HGBA1C 6.2 02/12/2012   continues to control with diet  Needs to exercise and work on wt loss Disc some indoor options for exercise with winter coming  Needs to be more active in general  His mother is watching sugar in his diet carefully

## 2012-02-27 ENCOUNTER — Encounter: Payer: Self-pay | Admitting: Gastroenterology

## 2012-03-06 DIAGNOSIS — M79609 Pain in unspecified limb: Secondary | ICD-10-CM | POA: Diagnosis not present

## 2012-03-06 DIAGNOSIS — B351 Tinea unguium: Secondary | ICD-10-CM | POA: Diagnosis not present

## 2012-03-21 ENCOUNTER — Ambulatory Visit (AMBULATORY_SURGERY_CENTER): Payer: Medicare Other | Admitting: *Deleted

## 2012-03-21 VITALS — Ht 69.0 in | Wt 220.0 lb

## 2012-03-21 DIAGNOSIS — Z1211 Encounter for screening for malignant neoplasm of colon: Secondary | ICD-10-CM

## 2012-03-21 MED ORDER — MOVIPREP 100 G PO SOLR: ORAL | Status: DC

## 2012-04-10 ENCOUNTER — Encounter: Payer: Self-pay | Admitting: Gastroenterology

## 2012-04-10 ENCOUNTER — Ambulatory Visit (AMBULATORY_SURGERY_CENTER): Payer: Medicare Other | Admitting: Gastroenterology

## 2012-04-10 VITALS — BP 103/57 | HR 68 | Temp 97.6°F | Resp 15 | Ht 69.0 in | Wt 220.0 lb

## 2012-04-10 DIAGNOSIS — Z1211 Encounter for screening for malignant neoplasm of colon: Secondary | ICD-10-CM | POA: Diagnosis not present

## 2012-04-10 DIAGNOSIS — E669 Obesity, unspecified: Secondary | ICD-10-CM | POA: Diagnosis not present

## 2012-04-10 DIAGNOSIS — I10 Essential (primary) hypertension: Secondary | ICD-10-CM | POA: Diagnosis not present

## 2012-04-10 DIAGNOSIS — E785 Hyperlipidemia, unspecified: Secondary | ICD-10-CM | POA: Diagnosis not present

## 2012-04-10 DIAGNOSIS — F79 Unspecified intellectual disabilities: Secondary | ICD-10-CM | POA: Diagnosis not present

## 2012-04-10 DIAGNOSIS — D126 Benign neoplasm of colon, unspecified: Secondary | ICD-10-CM | POA: Diagnosis not present

## 2012-04-10 MED ORDER — FLEET ENEMA 7-19 GM/118ML RE ENEM
1.0000 | ENEMA | Freq: Once | RECTAL | Status: AC
  Administered 2012-04-10: 1 via RECTAL

## 2012-04-10 MED ORDER — SODIUM CHLORIDE 0.9 % IV SOLN: 500.0000 mL | INTRAVENOUS | Status: DC

## 2012-04-10 NOTE — Progress Notes (Signed)
Patient did not have preoperative order for IV antibiotic SSI prophylaxis. (G8918) Patient did not experience any of the following events: a burn prior to discharge; a fall within the facility; wrong site/side/patient/procedure/implant event; or a hospital transfer or hospital admission upon discharge from the facility. (G8907)   Charted by April Mirts RN 

## 2012-04-10 NOTE — Progress Notes (Addendum)
PATIENT REPORTS THAT HIS LAST BOWEL MOVEMENT WAS BROWN LIQUID. INFORMED DR. PATTERSON, ONE FLEET ENEMA GIVEN. RESULTS DENSE YELLOW , NO SOLIDS. SECOND ENEMA GIVEN

## 2012-04-10 NOTE — Progress Notes (Signed)
1045 am 1st IV attempt to rt hand unsuccessful per k tyrrell rn and 2nd unsuccessful to left hand by n sechrist rn. Pt tolerated attempts well. ewm

## 2012-04-10 NOTE — Patient Instructions (Addendum)
YOU HAD AN ENDOSCOPIC PROCEDURE TODAY AT THE [facility]: Refer to the procedure report that was given to you for any specific questions about what was found during the examination.  If the procedure report does not answer your questions, please call your gastroenterologist to clarify.  If you requested that your care partner not be given the details of your procedure findings, then the procedure report has been included in a sealed envelope for you to review at your convenience later.  YOU SHOULD EXPECT: Some feelings of bloating in the abdomen. Passage of more gas than usual.  Walking can help get rid of the air that was put into your GI tract during the procedure and reduce the bloating. If you had a lower endoscopy (such as a colonoscopy or flexible sigmoidoscopy) you may notice spotting of blood in your stool or on the toilet paper. If you underwent a bowel prep for your procedure, then you may not have a normal bowel movement for a few days.  DIET: Your first meal following the procedure should be a light meal and then it is ok to progress to your normal diet.  A half-sandwich or bowl of soup is an example of a good first meal.  Heavy or fried foods are harder to digest and may make you feel nauseous or bloated.  Likewise meals heavy in dairy and vegetables can cause extra gas to form and this can also increase the bloating.  Drink plenty of fluids but you should avoid alcoholic beverages for 24 hours.  ACTIVITY: Your care partner should take you home directly after the procedure.  You should plan to take it easy, moving slowly for the rest of the day.  You can resume normal activity the day after the procedure however you should NOT DRIVE or use heavy machinery for 24 hours (because of the sedation medicines used during the test).    SYMPTOMS TO REPORT IMMEDIATELY: A gastroenterologist can be reached at any hour.  During normal business hours, 8:30 AM to 5:00 PM Monday through Friday,  call [phone].  After hours and on weekends, please call the GI answering service at [phone] who will take a message and have the physician on call contact you.   Following lower endoscopy (colonoscopy or flexible sigmoidoscopy):  Excessive amounts of blood in the stool  Significant tenderness or worsening of abdominal pains  Swelling of the abdomen that is new, acute  Fever of 100F or higher  FOLLOW UP: If any biopsies were taken you will be contacted by phone or by letter within the next 1-3 weeks.  Call your gastroenterologist if you have not heard about the biopsies in 3 weeks.  Our staff will call the home number listed on your records the next business day following your procedure to check on you and address any questions or concerns that you may have at that time regarding the information given to you following your procedure. This is a courtesy call and so if there is no answer at the home number and we have not heard from you through the emergency physician on call, we will assume that you have returned to your regular daily activities without incident.  SIGNATURES/CONFIDENTIALITY: You and/or your care partner have signed paperwork which will be entered into your electronic medical record.  These signatures attest to the fact that that the information above on your After Visit Summary has been reviewed and is understood.  Full responsibility of the confidentiality of this   discharge information lies with you and/or your care-partner.  Polyps-handout given   

## 2012-04-10 NOTE — Op Note (Signed)
[facility] [address] [place], [address]   COLONOSCOPY PROCEDURE REPORT  PATIENT: Daniel Bailey, Daniel Bailey  MR#: [id number] BIRTHDATE: 1950-12-16 , 61  yrs. old GENDER: Male ENDOSCOPIST: Mardella Layman, MD, Clement[facility] Graham REFERRED BY:  [name], M.D. PROCEDURE DATE:  04/10/2012 PROCEDURE:   Colonoscopy with biopsy ASA CLASS:   Class III INDICATIONS:Average risk patient for colon cancer. MEDICATIONS: propofol (Diprivan) 250mg  IV  DESCRIPTION OF PROCEDURE:   After the risks and benefits and of the procedure were explained, informed consent was obtained.  A digital rectal exam revealed no abnormalities of the rectum.    The LB [id number]  endoscope was introduced through the anus and advanced to the cecum, which was identified by both the appendix and ileocecal valve .  The quality of the prep was adequate, using MoviPrep .  The instrument was then slowly withdrawn as the colon was fully examined.     COLON FINDINGS: A normal appearing cecum, ileocecal valve, and appendiceal orifice were identified.  The ascending, hepatic flexure, transverse, splenic flexure, descending, sigmoid colon and rectum appeared unremarkable.  No polyps or cancers were seen.Very long and redundant colon noted...hard exam !!!!Small rectal nodules biopsied...     Retroflexed views revealed no abnormalities. The scope was then withdrawn from the patient and the procedure completed.  COMPLICATIONS: There were no complications. ENDOSCOPIC IMPRESSION: Normal colon ,,,,small rectal polyps cold biopsied...very redundant and tortuous colon noted,,,  RECOMMENDATIONS: 1.  Await biopsy results 2.  Repeat colonoscopy in 5 years if polyp adenomatous; otherwise 10 years 3.  Continue current medications   REPEAT EXAM:  cc:  _______________________________ eSignedMardella Layman, MD, [facility] 04/10/2012 11:55 AM

## 2012-04-11 ENCOUNTER — Telehealth: Payer: Self-pay

## 2012-04-11 NOTE — Telephone Encounter (Signed)
  Follow up Call-  Call back number 04/10/2012  Post procedure Call Back phone  # [phone]  Permission to leave phone message Yes     Patient questions:  Do you have a fever, pain , or abdominal swelling? no Pain Score  0 *  Have you tolerated food without any problems? yes  Have you been able to return to your normal activities? yes  Do you have any questions about your discharge instructions: Diet   no Medications  no Follow up visit  no  Do you have questions or concerns about your Care? no  Actions: * If pain score is 4 or above: No action needed, pain <4.

## 2012-04-15 ENCOUNTER — Encounter: Payer: Self-pay | Admitting: Gastroenterology

## 2012-06-27 ENCOUNTER — Telehealth: Payer: Self-pay

## 2012-06-27 NOTE — Telephone Encounter (Signed)
Pt's mother called; Mrs. 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 Mrs Buchheit wanted to know if Dr Milinda Antis could sign or does pt need to have appt.Please advise.

## 2012-06-27 NOTE — Telephone Encounter (Signed)
Drop it by - I can probably just sign it , will take a look

## 2012-06-28 NOTE — Telephone Encounter (Signed)
Pt's mother advise to drop off paperwork

## 2012-07-10 DIAGNOSIS — H401290 Low-tension glaucoma, unspecified eye, stage unspecified: Secondary | ICD-10-CM | POA: Diagnosis not present

## 2012-07-10 DIAGNOSIS — H25019 Cortical age-related cataract, unspecified eye: Secondary | ICD-10-CM | POA: Diagnosis not present

## 2012-08-19 ENCOUNTER — Telehealth: Payer: Self-pay | Admitting: Family Medicine

## 2012-08-19 DIAGNOSIS — E785 Hyperlipidemia, unspecified: Secondary | ICD-10-CM

## 2012-08-19 DIAGNOSIS — I10 Essential (primary) hypertension: Secondary | ICD-10-CM

## 2012-08-19 DIAGNOSIS — Z125 Encounter for screening for malignant neoplasm of prostate: Secondary | ICD-10-CM

## 2012-08-19 DIAGNOSIS — Z0000 Encounter for general adult medical examination without abnormal findings: Secondary | ICD-10-CM

## 2012-08-19 DIAGNOSIS — R739 Hyperglycemia, unspecified: Secondary | ICD-10-CM

## 2012-08-19 NOTE — Telephone Encounter (Signed)
Message copied by [name] on Mon Aug 19, 2012 10:16 PM ------      Message from: Alvina Chou      Created: Thu Aug 15, 2012 12:03 PM      Regarding: Lab orders for Tuesday, 4.15.14       Patient is scheduled for CPX labs, please order future labs, Thanks , Terri       ------

## 2012-08-20 ENCOUNTER — Other Ambulatory Visit (INDEPENDENT_AMBULATORY_CARE_PROVIDER_SITE_OTHER): Payer: Medicare Other

## 2012-08-20 DIAGNOSIS — Z125 Encounter for screening for malignant neoplasm of prostate: Secondary | ICD-10-CM | POA: Diagnosis not present

## 2012-08-20 DIAGNOSIS — I10 Essential (primary) hypertension: Secondary | ICD-10-CM | POA: Diagnosis not present

## 2012-08-20 DIAGNOSIS — R7309 Other abnormal glucose: Secondary | ICD-10-CM | POA: Diagnosis not present

## 2012-08-20 DIAGNOSIS — E7800 Pure hypercholesterolemia, unspecified: Secondary | ICD-10-CM

## 2012-08-20 DIAGNOSIS — E785 Hyperlipidemia, unspecified: Secondary | ICD-10-CM

## 2012-08-20 DIAGNOSIS — R739 Hyperglycemia, unspecified: Secondary | ICD-10-CM

## 2012-08-20 LAB — CBC WITH DIFFERENTIAL/PLATELET
Basophils Absolute: 0 10*3/uL (ref 0.0–0.1)
Eosinophils Absolute: 0.1 10*3/uL (ref 0.0–0.7)
Lymphocytes Relative: 48.9 % — ABNORMAL HIGH (ref 12.0–46.0)
MCHC: 33.7 g/dL (ref 30.0–36.0)
Neutro Abs: 1.2 10*3/uL — ABNORMAL LOW (ref 1.4–7.7)
Neutrophils Relative %: 38.2 % — ABNORMAL LOW (ref 43.0–77.0)
RDW: 13.8 % (ref 11.5–14.6)

## 2012-08-20 LAB — LIPID PANEL
HDL: 37.9 mg/dL — ABNORMAL LOW (ref >39.00)
LDL Cholesterol: 115 mg/dL — ABNORMAL HIGH (ref 0–99)
Total CHOL/HDL Ratio: 4
VLDL: 13.2 mg/dL (ref 0.0–40.0)

## 2012-08-20 LAB — COMPREHENSIVE METABOLIC PANEL
ALT: 13 U/L (ref 0–53)
AST: 18 U/L (ref 0–37)
Chloride: 99 mEq/L (ref 96–112)
Creatinine, Ser: 1.1 mg/dL (ref 0.4–1.5)
Total Bilirubin: 0.5 mg/dL (ref 0.3–1.2)

## 2012-08-20 LAB — TSH: TSH: 3.46 u[IU]/mL (ref 0.35–5.50)

## 2012-08-27 ENCOUNTER — Encounter: Payer: Self-pay | Admitting: Family Medicine

## 2012-08-27 ENCOUNTER — Ambulatory Visit (INDEPENDENT_AMBULATORY_CARE_PROVIDER_SITE_OTHER): Payer: Medicare Other | Admitting: Family Medicine

## 2012-08-27 VITALS — BP 128/84 | HR 67 | Temp 97.9°F | Ht 68.0 in | Wt 218.5 lb

## 2012-08-27 DIAGNOSIS — R7309 Other abnormal glucose: Secondary | ICD-10-CM

## 2012-08-27 DIAGNOSIS — Z1211 Encounter for screening for malignant neoplasm of colon: Secondary | ICD-10-CM

## 2012-08-27 DIAGNOSIS — Z0000 Encounter for general adult medical examination without abnormal findings: Secondary | ICD-10-CM | POA: Diagnosis not present

## 2012-08-27 DIAGNOSIS — I10 Essential (primary) hypertension: Secondary | ICD-10-CM

## 2012-08-27 DIAGNOSIS — Z125 Encounter for screening for malignant neoplasm of prostate: Secondary | ICD-10-CM | POA: Diagnosis not present

## 2012-08-27 DIAGNOSIS — R739 Hyperglycemia, unspecified: Secondary | ICD-10-CM

## 2012-08-27 DIAGNOSIS — E785 Hyperlipidemia, unspecified: Secondary | ICD-10-CM | POA: Diagnosis not present

## 2012-08-27 DIAGNOSIS — E669 Obesity, unspecified: Secondary | ICD-10-CM | POA: Diagnosis not present

## 2012-08-27 NOTE — Patient Instructions (Addendum)
If you are interested in a shingles/zoster vaccine - call your insurance to check on coverage,( you should not get it within 1 month of other vaccines) , then call us for a prescription  for it to take to a pharmacy that gives the shot , or make a nurse visit to get it here depending on your coverage Let us know the next time you are here if you want a pneumonia vaccine For cholesterol and weight and sugar control- I want you to get more exercise - either at home or outside (walking is good) - try for 5 days per week Also A1c is up - so watch sugar in diet and work on weight loss with better food choices and also cutting portions and exercising Follow up with me in 6 months with labs prior

## 2012-08-27 NOTE — Assessment & Plan Note (Signed)
bp in fair control at this time  No changes needed  Disc lifstyle change with low sodium diet and exercise  Labs reviewed today

## 2012-08-27 NOTE — Progress Notes (Signed)
Subjective:    Patient ID: Daniel Bailey, male    DOB: [birth year], 62 y.o.   MRN: [id number]  HPI I have personally reviewed the Medicare Annual Wellness questionnaire and have noted 1. The patient's medical and social history 2. Their use of alcohol, tobacco or illicit drugs 3. Their current medications and supplements 4. The patient's functional ability including ADL's, fall risks, home safety risks and hearing or visual             impairment. 5. Diet and physical activities 6. Evidence for depression or mood disorders  Vision test off today -- used the shapes and did not recognize all of them- he does see an eye doctor regularly and uses drops   Wt is down 2 lb from last time bmi of 33   The patients weight, height, BMI have been recorded in the chart and visual acuity is per eye clinic.  I have made referrals, counseling and provided education to the patient based review of the above and I have provided the pt with a written personalized care plan for preventive services.  See scanned forms.  Routine anticipatory guidance given to patient.  See health maintenance. Flu 10/13 imm Shingles status - is interested in vaccine-will check with insurance and check with his mother PNA  -never had / no risk factors - will discuss with his mother Tetanus 7/11 Colonoscopy 12/13 with hyperplastic polyps- 5 year recall with family history   Prostate cancer screening  Lab Results  Component Value Date   PSA 0.83 08/20/2012   PSA 0.51 08/11/2011   PSA 0.56 08/16/2009   no prostate symptoms  No flow or stream problems  Nocturia times one    Advance directive-family was working on that - mother is working on that  Cognitive function addressed- see scanned forms- and if abnormal then additional documentation follows.  No new memory problems (more than normal) Pt has baseline MR   Mood-no changes/ is generally pretty easy going   Falls-none recently   bp is stable today  No cp or  palpitations or headaches or edema  No side effects to medicines  BP Readings from Last 3 Encounters:  08/27/12 128/84  04/10/12 103/57  02/19/12 124/72     Hyperlipidemia On gemfibrozil Lab Results  Component Value Date   CHOL 166 08/20/2012   CHOL 165 02/12/2012   CHOL 194 08/11/2011   Lab Results  Component Value Date   HDL 37.90* 08/20/2012   HDL 37.30* 02/12/2012   HDL 40.50 08/11/2011   Lab Results  Component Value Date   LDLCALC 115* 08/20/2012   LDLCALC 113* 02/12/2012   LDLCALC 141* 08/11/2011   Lab Results  Component Value Date   TRIG 66.0 08/20/2012   TRIG 73.0 02/12/2012   TRIG 64.0 08/11/2011   Lab Results  Component Value Date   CHOLHDL 4 08/20/2012   CHOLHDL 4 02/12/2012   CHOLHDL 5 08/11/2011   Lab Results  Component Value Date   LDLDIRECT 175.3 08/16/2009   overall stable - but needs more exercise No side effect from fibrate   Hyperglycemia  Lab Results  Component Value Date   HGBA1C 6.4 08/20/2012  this is up a bit to pre diabetes  Mother has avoided buying sweets in general    PMH and SH reviewed  Meds, vitals, and allergies reviewed.    Patient Active Problem List  Diagnosis  . HYPERLIPIDEMIA  . MENTAL RETARDATION  . HYPERTENSION, BENIGN ESSENTIAL  . Hyperglycemia  .  Routine general medical examination at a health care facility  . Prostate cancer screening  . Obesity  . Colon cancer screening  . Encounter for Medicare annual wellness exam   Past Medical History  Diagnosis Date  . Glaucoma   . Hyperlipidemia   . Hypertension   . Hyperglycemia    No past surgical history on file. History  Substance Use Topics  . Smoking status: Never Smoker   . Smokeless tobacco: Never Used  . Alcohol Use: Not on file   Family History  Problem Relation Age of Onset  . Colon cancer Mother    No Known Allergies Current Outpatient Prescriptions on File Prior to Visit  Medication Sig Dispense Refill  . brimonidine (ALPHAGAN) 0.15 % ophthalmic solution  Place 1 drop into both eyes 2 (two) times daily.      Marland Kitchen gemfibrozil (LOPID) 600 MG tablet TAKE ONE TABLET BY MOUTH TWICE DAILY  60 tablet  11  . lisinopril (PRINIVIL,ZESTRIL) 5 MG tablet Take 1 tablet (5 mg total) by mouth daily.  30 tablet  11  . MOVIPREP 100 G SOLR MOVI PREP take as directed no substitution  1 kit  0   No current facility-administered medications on file prior to visit.    Review of Systems Review of Systems  Constitutional: Negative for fever, appetite change, fatigue and unexpected weight change.  Eyes: Negative for pain and visual disturbance.  Respiratory: Negative for cough and shortness of breath.   Cardiovascular: Negative for cp or palpitations    Gastrointestinal: Negative for nausea, diarrhea and constipation.  Genitourinary: Negative for urgency and frequency.  Skin: Negative for pallor or rash   Neurological: Negative for weakness, light-headedness, numbness and headaches.  Hematological: Negative for adenopathy. Does not bruise/bleed easily.  Psychiatric/Behavioral: Negative for dysphoric mood. The patient is not nervous/anxious.         Objective:   Physical Exam  Constitutional: He appears well-developed and well-nourished. No distress.  HENT:  Head: Normocephalic and atraumatic.  Right Ear: External ear normal.  Left Ear: External ear normal.  Nose: Nose normal.  Mouth/Throat: Oropharynx is clear and moist.  Eyes: Conjunctivae and EOM are normal. Pupils are equal, round, and reactive to light. Left eye exhibits no discharge. No scleral icterus.  Neck: Normal range of motion. Neck supple. No JVD present. Carotid bruit is not present. No thyromegaly present.  Cardiovascular: Normal rate, regular rhythm, normal heart sounds and intact distal pulses.  Exam reveals no gallop.   No murmur heard. Pulmonary/Chest: Effort normal and breath sounds normal. No respiratory distress. He has no wheezes. He exhibits no tenderness.  No crackles  Abdominal: Soft.  Bowel sounds are normal. He exhibits no distension, no abdominal bruit and no mass. There is no tenderness.  Musculoskeletal: He exhibits no edema and no tenderness.  Lymphadenopathy:    He has no cervical adenopathy.  Neurological: He is alert. He has normal reflexes. No cranial nerve deficit. He exhibits normal muscle tone. Coordination normal.  Skin: Skin is warm and dry. No rash noted. No erythema. No pallor.  Psychiatric: He has a normal mood and affect.          Assessment & Plan:

## 2012-08-27 NOTE — Assessment & Plan Note (Signed)
Discussed how this problem influences overall health and the risks it imposes  Reviewed plan for weight loss with lower calorie diet (via better food choices and also portion control or program like weight watchers) and exercise building up to or more than 30 minutes 5 days per week including some aerobic activity    

## 2012-08-27 NOTE — Assessment & Plan Note (Signed)
psa is stable and no symptoms besides baseline nocturia times one  Will continue to watch  DRE not done in light of MR hx today

## 2012-08-27 NOTE — Assessment & Plan Note (Signed)
Colonoscopy utd 12/13 No stool changes Hyperplastic polyps

## 2012-08-27 NOTE — Assessment & Plan Note (Signed)
Reviewed health habits including diet and exercise and skin cancer prevention Also reviewed health mt list, fam hx and immunizations  Reviewed wellness labs in detail  Vision exam is not accurate due to MR and difficulty taking the test  Will disc with family zostavax and pneumovax options

## 2012-08-27 NOTE — Assessment & Plan Note (Signed)
Lab Results  Component Value Date   HGBA1C 6.4 08/20/2012    Pre diabetes Exp importance of healthy low glycemic diet/ exercise and wt loss

## 2012-08-27 NOTE — Assessment & Plan Note (Signed)
Disc goals for lipids and reasons to control them Rev labs with pt Rev low sat fat diet in detail Will continue fibrate Recommend exercise to raise HDL

## 2012-11-18 ENCOUNTER — Other Ambulatory Visit: Payer: Self-pay | Admitting: Family Medicine

## 2012-12-22 ENCOUNTER — Other Ambulatory Visit: Payer: Self-pay | Admitting: Family Medicine

## 2013-02-26 ENCOUNTER — Ambulatory Visit: Payer: Medicare Other | Admitting: Family Medicine

## 2013-02-28 ENCOUNTER — Ambulatory Visit (INDEPENDENT_AMBULATORY_CARE_PROVIDER_SITE_OTHER): Payer: Medicare Other | Admitting: Family Medicine

## 2013-02-28 ENCOUNTER — Encounter: Payer: Self-pay | Admitting: Family Medicine

## 2013-02-28 VITALS — BP 136/78 | HR 70 | Temp 97.8°F | Ht 68.0 in | Wt 213.2 lb

## 2013-02-28 DIAGNOSIS — E785 Hyperlipidemia, unspecified: Secondary | ICD-10-CM | POA: Diagnosis not present

## 2013-02-28 DIAGNOSIS — Z23 Encounter for immunization: Secondary | ICD-10-CM

## 2013-02-28 DIAGNOSIS — R739 Hyperglycemia, unspecified: Secondary | ICD-10-CM

## 2013-02-28 DIAGNOSIS — E669 Obesity, unspecified: Secondary | ICD-10-CM

## 2013-02-28 DIAGNOSIS — I10 Essential (primary) hypertension: Secondary | ICD-10-CM | POA: Diagnosis not present

## 2013-02-28 DIAGNOSIS — R7309 Other abnormal glucose: Secondary | ICD-10-CM | POA: Diagnosis not present

## 2013-02-28 LAB — LIPID PANEL: HDL: 42 mg/dL (ref >39)

## 2013-02-28 LAB — HEMOGLOBIN A1C: Hgb A1c MFr Bld: 6 % — ABNORMAL HIGH (ref <5.7)

## 2013-02-28 NOTE — Progress Notes (Signed)
Subjective:    Patient ID: Daniel Bailey, male    DOB: 06/22/1950, 62 y.o.   MRN: [id number]  HPI Here for f/u of chronic medical problems  Feeling good  Is trying to get out more  Is doing some walking - a little   Wt is down 5 lbs  He has been walking more and cutting back on eating  He has to eat a DM diet with his mother - and not a picky eater   bp is stable today  No cp or palpitations or headaches or edema  No side effects to medicines  BP Readings from Last 3 Encounters:  02/28/13 136/78  08/27/12 128/84  04/10/12 103/57     Lab Results  Component Value Date   HGBA1C 6.4 08/20/2012   doing better since family is doing shopping - trying not to eat sweets   Need to check cholesterol also Lab Results  Component Value Date   CHOL 166 08/20/2012   HDL 37.90* 08/20/2012   LDLCALC 115* 08/20/2012   LDLDIRECT 175.3 08/16/2009   TRIG 66.0 08/20/2012   CHOLHDL 4 08/20/2012    Is not eating fried foods   Patient Active Problem List   Diagnosis Date Noted  . Encounter for Medicare annual wellness exam 08/27/2012  . Obesity 08/18/2011  . Colon cancer screening 08/18/2011  . Routine general medical examination at a health care facility 08/10/2011  . Prostate cancer screening 08/10/2011  . Hyperglycemia 05/30/2010  . HYPERTENSION, BENIGN ESSENTIAL 12/10/2006  . HYPERLIPIDEMIA 11/23/2006  . MENTAL RETARDATION 11/23/2006   Past Medical History  Diagnosis Date  . Glaucoma   . Hyperlipidemia   . Hypertension   . Hyperglycemia    No past surgical history on file. History  Substance Use Topics  . Smoking status: Never Smoker   . Smokeless tobacco: Never Used  . Alcohol Use: Not on file   Family History  Problem Relation Age of Onset  . Colon cancer Mother    No Known Allergies Current Outpatient Prescriptions on File Prior to Visit  Medication Sig Dispense Refill  . brimonidine (ALPHAGAN) 0.15 % ophthalmic solution Place 1 drop into both eyes 2 (two) times daily.       Marland Kitchen gemfibrozil (LOPID) 600 MG tablet TAKE 1 TABLET BY MOUTH TWICE A DAY  60 tablet  2  . lisinopril (PRINIVIL,ZESTRIL) 5 MG tablet TAKE ONE TABLET BY MOUTH EVERY DAY  30 tablet  5   No current facility-administered medications on file prior to visit.      Review of Systems Review of Systems  Constitutional: Negative for fever, appetite change, fatigue and unexpected weight change.  Eyes: Negative for pain and visual disturbance.  Respiratory: Negative for cough and shortness of breath.   Cardiovascular: Negative for cp or palpitations    Gastrointestinal: Negative for nausea, diarrhea and constipation.  Genitourinary: Negative for urgency and frequency.  Skin: Negative for pallor or rash   Neurological: Negative for weakness, light-headedness, numbness and headaches.  Hematological: Negative for adenopathy. Does not bruise/bleed easily.  Psychiatric/Behavioral: Negative for dysphoric mood. The patient is not nervous/anxious.         Objective:   Physical Exam  Constitutional: He appears well-developed and well-nourished. No distress.  obese and well appearing   HENT:  Head: Normocephalic and atraumatic.  Right Ear: External ear normal.  Left Ear: External ear normal.  Nose: Nose normal.  Mouth/Throat: Oropharynx is clear and moist.  Eyes: Conjunctivae and EOM are normal. Pupils are  equal, round, and reactive to light. Right eye exhibits no discharge. Left eye exhibits no discharge. No scleral icterus.  Neck: Normal range of motion. Neck supple. No JVD present. Carotid bruit is not present. No thyromegaly present.  Cardiovascular: Normal rate, regular rhythm, normal heart sounds and intact distal pulses.  Exam reveals no gallop.   Pulmonary/Chest: Effort normal and breath sounds normal. No respiratory distress. He has no wheezes. He exhibits no tenderness.  Abdominal: Soft. Bowel sounds are normal. He exhibits no distension, no abdominal bruit and no mass. There is no tenderness.   Musculoskeletal: He exhibits no edema and no tenderness.  Lymphadenopathy:    He has no cervical adenopathy.  Neurological: He is alert. He has normal reflexes. No cranial nerve deficit. He exhibits normal muscle tone. Coordination normal.  Skin: Skin is warm and dry. No rash noted. No erythema. No pallor.  Psychiatric:  Baseline MR- good mood/ smiling and answers some questions          Assessment & Plan:

## 2013-02-28 NOTE — Patient Instructions (Signed)
Follow up in 6 months for annual exam with labs prior  Keep watching sugar in diet  Try to walk every day Keep working on weight loss

## 2013-03-02 NOTE — Assessment & Plan Note (Signed)
BP: 136/78 mmHg   bp in fair control at this time  No changes needed  Disc lifstyle change with low sodium diet and exercise   Lab rev  F/u 6 mo

## 2013-03-02 NOTE — Assessment & Plan Note (Signed)
Commended on wt lost so far Discussed how this problem influences overall health and the risks it imposes  Reviewed plan for weight loss with lower calorie diet (via better food choices and also portion control or program like weight watchers) and exercise building up to or more than 30 minutes 5 days per week including some aerobic activity    He will start walking when he can-has to have someone with him

## 2013-03-02 NOTE — Assessment & Plan Note (Signed)
Lab Results  Component Value Date   HGBA1C 6.0* 02/28/2013   Aware he is borderline for DM Disc imp of wt loss/ daily exercise and low glycemic diet (caregiver) F/u 6 mo

## 2013-03-02 NOTE — Assessment & Plan Note (Signed)
Disc goals for lipids and reasons to control them Rev labs with pt Rev low sat fat diet in detail  Stable with lopid and diet

## 2013-03-03 NOTE — Addendum Note (Signed)
Addended by: Shon Millet on: 03/03/2013 08:09 AM   Modules accepted: Orders

## 2013-03-04 ENCOUNTER — Encounter: Payer: Self-pay | Admitting: *Deleted

## 2013-04-01 DIAGNOSIS — B351 Tinea unguium: Secondary | ICD-10-CM | POA: Diagnosis not present

## 2013-04-01 DIAGNOSIS — M79609 Pain in unspecified limb: Secondary | ICD-10-CM | POA: Diagnosis not present

## 2013-04-02 ENCOUNTER — Other Ambulatory Visit: Payer: Self-pay | Admitting: Family Medicine

## 2013-06-09 ENCOUNTER — Other Ambulatory Visit: Payer: Self-pay | Admitting: Family Medicine

## 2013-08-09 ENCOUNTER — Telehealth: Payer: Self-pay | Admitting: Family Medicine

## 2013-08-09 DIAGNOSIS — Z125 Encounter for screening for malignant neoplasm of prostate: Secondary | ICD-10-CM

## 2013-08-09 DIAGNOSIS — R739 Hyperglycemia, unspecified: Secondary | ICD-10-CM

## 2013-08-09 DIAGNOSIS — E785 Hyperlipidemia, unspecified: Secondary | ICD-10-CM

## 2013-08-09 DIAGNOSIS — I10 Essential (primary) hypertension: Secondary | ICD-10-CM

## 2013-08-09 NOTE — Telephone Encounter (Signed)
Message copied by [name] on Sat Aug 09, 2013 11:42 AM ------      Message from: Ellamae Sia      Created: Fri Aug 01, 2013 12:58 PM      Regarding: Lab orders for Tuesday, 4.7.15       Patient is scheduled for CPX labs, please order future labs, Thanks , Terri       ------

## 2013-08-11 ENCOUNTER — Other Ambulatory Visit (INDEPENDENT_AMBULATORY_CARE_PROVIDER_SITE_OTHER): Payer: Medicare Other

## 2013-08-11 DIAGNOSIS — R7309 Other abnormal glucose: Secondary | ICD-10-CM | POA: Diagnosis not present

## 2013-08-11 DIAGNOSIS — I10 Essential (primary) hypertension: Secondary | ICD-10-CM | POA: Diagnosis not present

## 2013-08-11 DIAGNOSIS — E785 Hyperlipidemia, unspecified: Secondary | ICD-10-CM

## 2013-08-11 DIAGNOSIS — Z125 Encounter for screening for malignant neoplasm of prostate: Secondary | ICD-10-CM | POA: Diagnosis not present

## 2013-08-11 DIAGNOSIS — R739 Hyperglycemia, unspecified: Secondary | ICD-10-CM

## 2013-08-11 LAB — CBC WITH DIFFERENTIAL/PLATELET
Basophils Absolute: 0 10*3/uL (ref 0.0–0.1)
Basophils Relative: 0.5 % (ref 0.0–3.0)
EOS ABS: 0.2 10*3/uL (ref 0.0–0.7)
EOS PCT: 4.5 % (ref 0.0–5.0)
HEMATOCRIT: 37.1 % — AB (ref 39.0–52.0)
Hemoglobin: 12.7 g/dL — ABNORMAL LOW (ref 13.0–17.0)
LYMPHS ABS: 1.3 10*3/uL (ref 0.7–4.0)
Lymphocytes Relative: 35.8 % (ref 12.0–46.0)
MCHC: 34.1 g/dL (ref 30.0–36.0)
MCV: 88.6 fl (ref 78.0–100.0)
MONO ABS: 0.3 10*3/uL (ref 0.1–1.0)
Monocytes Relative: 9.5 % (ref 3.0–12.0)
NEUTROS PCT: 49.7 % (ref 43.0–77.0)
Neutro Abs: 1.8 10*3/uL (ref 1.4–7.7)
PLATELETS: 346 10*3/uL (ref 150.0–400.0)
RBC: 4.19 Mil/uL — ABNORMAL LOW (ref 4.22–5.81)
RDW: 14.1 % (ref 11.5–14.6)
WBC: 3.6 10*3/uL — ABNORMAL LOW (ref 4.5–10.5)

## 2013-08-11 LAB — COMPREHENSIVE METABOLIC PANEL
ALK PHOS: 56 U/L (ref 39–117)
ALT: 12 U/L (ref 0–53)
AST: 17 U/L (ref 0–37)
Albumin: 4 g/dL (ref 3.5–5.2)
BILIRUBIN TOTAL: 0.7 mg/dL (ref 0.3–1.2)
BUN: 21 mg/dL (ref 6–23)
CO2: 27 mEq/L (ref 19–32)
Calcium: 9.4 mg/dL (ref 8.4–10.5)
Chloride: 101 mEq/L (ref 96–112)
Creatinine, Ser: 1 mg/dL (ref 0.4–1.5)
GFR: 95.77 mL/min (ref >60.00)
GLUCOSE: 107 mg/dL — AB (ref 70–99)
Potassium: 4.6 mEq/L (ref 3.5–5.1)
SODIUM: 135 meq/L (ref 135–145)
TOTAL PROTEIN: 7.4 g/dL (ref 6.0–8.3)

## 2013-08-11 LAB — LIPID PANEL
CHOLESTEROL: 177 mg/dL (ref 0–200)
HDL: 38 mg/dL — AB (ref >39.00)
LDL Cholesterol: 125 mg/dL — ABNORMAL HIGH (ref 0–99)
Total CHOL/HDL Ratio: 5
Triglycerides: 69 mg/dL (ref 0.0–149.0)
VLDL: 13.8 mg/dL (ref 0.0–40.0)

## 2013-08-11 LAB — PSA: PSA: 0.75 ng/mL (ref 0.10–4.00)

## 2013-08-11 LAB — HEMOGLOBIN A1C: Hgb A1c MFr Bld: 6.4 % (ref 4.6–6.5)

## 2013-08-11 LAB — TSH: TSH: 3.49 u[IU]/mL (ref 0.35–5.50)

## 2013-08-12 ENCOUNTER — Other Ambulatory Visit: Payer: Medicare Other

## 2013-08-19 ENCOUNTER — Encounter: Payer: Self-pay | Admitting: Family Medicine

## 2013-08-19 ENCOUNTER — Ambulatory Visit (INDEPENDENT_AMBULATORY_CARE_PROVIDER_SITE_OTHER): Payer: Medicare Other | Admitting: Family Medicine

## 2013-08-19 VITALS — BP 116/70 | HR 67 | Temp 98.0°F | Ht 66.5 in | Wt 211.8 lb

## 2013-08-19 DIAGNOSIS — R739 Hyperglycemia, unspecified: Secondary | ICD-10-CM

## 2013-08-19 DIAGNOSIS — E669 Obesity, unspecified: Secondary | ICD-10-CM

## 2013-08-19 DIAGNOSIS — Z0000 Encounter for general adult medical examination without abnormal findings: Secondary | ICD-10-CM | POA: Diagnosis not present

## 2013-08-19 DIAGNOSIS — I10 Essential (primary) hypertension: Secondary | ICD-10-CM

## 2013-08-19 DIAGNOSIS — R7309 Other abnormal glucose: Secondary | ICD-10-CM | POA: Diagnosis not present

## 2013-08-19 DIAGNOSIS — Z125 Encounter for screening for malignant neoplasm of prostate: Secondary | ICD-10-CM | POA: Diagnosis not present

## 2013-08-19 DIAGNOSIS — E785 Hyperlipidemia, unspecified: Secondary | ICD-10-CM | POA: Diagnosis not present

## 2013-08-19 MED ORDER — GEMFIBROZIL 600 MG PO TABS: ORAL_TABLET | ORAL | Status: DC

## 2013-08-19 MED ORDER — LISINOPRIL 5 MG PO TABS: ORAL_TABLET | ORAL | Status: DC

## 2013-08-19 NOTE — Progress Notes (Signed)
Pre visit review using our clinic review tool, if applicable. No additional management support is needed unless otherwise documented below in the visit note. 

## 2013-08-19 NOTE — Patient Instructions (Signed)
If you are interested in a shingles/zoster vaccine - call your insurance to check on coverage,( you should not get it within 1 month of other vaccines) , then call us for a prescription  for it to take to a pharmacy that gives the shot , or make a nurse visit to get it here depending on your coverage Sugar level is up - please watch diet more carefully for sugar and carbohydrates (starches) and limit portions and work on regular exercise  Aim for walking (or other exercise) 5 days per week if possible Follow up in 6 months with labs prior

## 2013-08-19 NOTE — Progress Notes (Signed)
Subjective:    Patient ID: Daniel Bailey, male    DOB: 04/23/1951, 63 y.o.   MRN: [id number]  HPI I have personally reviewed the Medicare Annual Wellness questionnaire and have noted 1. The patient's medical and social history 2. Their use of alcohol, tobacco or illicit drugs 3. Their current medications and supplements 4. The patient's functional ability including ADL's, fall risks, home safety risks and hearing or visual             impairment. 5. Diet and physical activities 6. Evidence for depression or mood disorders  The patients weight, height, BMI have been recorded in the chart and visual acuity is per eye clinic.  I have made referrals, counseling and provided education to the patient based review of the above and I have provided the pt with a written personalized care plan for preventive services.  He is doing well overall  Not doing a lot  Watches TV a lot - some walking outdoors (not enough) He also helps take care of his mother    See scanned forms.  Routine anticipatory guidance given to patient.  See health maintenance. Colon cancer screening 12/13 - 5 year recall he thinks  Flu vaccine 1014 Tetanus vaccine 7/11  Zoster vaccine would be interested in if affordable  Prostate cancer screening  Lab Results  Component Value Date   PSA 0.75 08/11/2013   PSA 0.83 08/20/2012   PSA 0.51 08/11/2011   nocturia times one  Advance directive Cognitive function addressed- see scanned forms- and if abnormal then additional documentation follows. -no significant new memory problems / occ short term issues (nothing notable per family)  PMH and SH reviewed  Meds, vitals, and allergies reviewed.   ROS: See HPI.  Otherwise negative.    Wt is down 2 lb with bmi of 33 Cut back on on sweets and soft drinks    bp is stable today  No cp or palpitations or headaches or edema  No side effects to medicines  BP Readings from Last 3 Encounters:  08/19/13 116/70  02/28/13 136/78    08/27/12 128/84     Hypertriglyceridemia Lab Results  Component Value Date   CHOL 177 08/11/2013   CHOL 164 02/28/2013   CHOL 166 08/20/2012   Lab Results  Component Value Date   HDL 38.00* 08/11/2013   HDL 42 02/28/2013   HDL 37.90* 08/20/2012   Lab Results  Component Value Date   LDLCALC 125* 08/11/2013   LDLCALC 103* 02/28/2013   LDLCALC 115* 08/20/2012   Lab Results  Component Value Date   TRIG 69.0 08/11/2013   TRIG 93 02/28/2013   TRIG 66.0 08/20/2012   Lab Results  Component Value Date   CHOLHDL 5 08/11/2013   CHOLHDL 3.9 02/28/2013   CHOLHDL 4 08/20/2012   Lab Results  Component Value Date   LDLDIRECT 175.3 08/16/2009    Hyperglycemia  Lab Results  Component Value Date   HGBA1C 6.4 08/11/2013   this is up from 6.0 Ate too many sweets last week   Patient Active Problem List   Diagnosis Date Noted  . Encounter for Medicare annual wellness exam 08/27/2012  . Obesity 08/18/2011  . Colon cancer screening 08/18/2011  . Routine general medical examination at a health care facility 08/10/2011  . Prostate cancer screening 08/10/2011  . Hyperglycemia 05/30/2010  . HYPERTENSION, BENIGN ESSENTIAL 12/10/2006  . HYPERLIPIDEMIA 11/23/2006  . MENTAL RETARDATION 11/23/2006   Past Medical History  Diagnosis Date  . Glaucoma   .  Hyperlipidemia   . Hypertension   . Hyperglycemia    No past surgical history on file. History  Substance Use Topics  . Smoking status: Never Smoker   . Smokeless tobacco: Never Used  . Alcohol Use: Not on file   Family History  Problem Relation Age of Onset  . Colon cancer Mother    No Known Allergies Current Outpatient Prescriptions on File Prior to Visit  Medication Sig Dispense Refill  . brimonidine (ALPHAGAN) 0.15 % ophthalmic solution Place 1 drop into both eyes 2 (two) times daily.       No current facility-administered medications on file prior to visit.    Review of Systems Review of Systems  Constitutional: Negative for fever,  appetite change, fatigue and unexpected weight change.  Eyes: Negative for pain and visual disturbance.  Respiratory: Negative for cough and shortness of breath.   Cardiovascular: Negative for cp or palpitations    Gastrointestinal: Negative for nausea, diarrhea and constipation.  Genitourinary: Negative for urgency and frequency.  Skin: Negative for pallor or rash   Neurological: Negative for weakness, light-headedness, numbness and headaches.  Hematological: Negative for adenopathy. Does not bruise/bleed easily.  Psychiatric/Behavioral: Negative for dysphoric mood. The patient is not nervous/anxious.  pos for baseline MR       Objective:   Physical Exam  Constitutional: He appears well-developed and well-nourished. No distress.  obese and well appearing   HENT:  Head: Normocephalic and atraumatic.  Right Ear: External ear normal.  Left Ear: External ear normal.  Nose: Nose normal.  Mouth/Throat: Oropharynx is clear and moist.  Eyes: Conjunctivae and EOM are normal. Pupils are equal, round, and reactive to light. Right eye exhibits no discharge. Left eye exhibits no discharge. No scleral icterus.  Neck: Normal range of motion. Neck supple. No JVD present. Carotid bruit is not present. No thyromegaly present.  Cardiovascular: Normal rate, regular rhythm, normal heart sounds and intact distal pulses.  Exam reveals no gallop.   Pulmonary/Chest: Effort normal and breath sounds normal. No respiratory distress. He has no wheezes. He exhibits no tenderness.  Abdominal: Soft. Bowel sounds are normal. He exhibits no distension, no abdominal bruit and no mass. There is no tenderness.  Musculoskeletal: He exhibits no edema and no tenderness.  Lymphadenopathy:    He has no cervical adenopathy.  Neurological: He is alert. He has normal reflexes. No cranial nerve deficit. He exhibits normal muscle tone. Coordination normal.  Skin: Skin is warm and dry. No rash noted. No erythema. No pallor.    Psychiatric: He has a normal mood and affect.  Baseline MR Answers questions appropriately          Assessment & Plan:

## 2013-08-21 NOTE — Assessment & Plan Note (Signed)
bp in fair control at this time  BP Readings from Last 1 Encounters:  08/19/13 116/70   No changes needed Disc lifstyle change with low sodium diet and exercise  Labs reviewed

## 2013-08-21 NOTE — Assessment & Plan Note (Signed)
Disc goals for lipids and reasons to control them Rev labs with pt Rev low sat fat diet in detail Lopid and diet  Recommended further dec in fat and sugar in diet

## 2013-08-21 NOTE — Assessment & Plan Note (Signed)
Reviewed health habits including diet and exercise and skin cancer prevention Reviewed appropriate screening tests for age  Also reviewed health mt list, fam hx and immunization status , as well as social and family history   Labs reviewed  

## 2013-08-21 NOTE — Assessment & Plan Note (Signed)
Lab Results  Component Value Date   HGBA1C 6.4 08/11/2013    Disc diet in detail with his cousin who does the shopping along with recommended exercise for wt loss  Will re check 6 mo

## 2013-08-21 NOTE — Assessment & Plan Note (Signed)
psa is stable  No symptoms  Will continue to follow

## 2013-08-21 NOTE — Assessment & Plan Note (Signed)
Discussed how this problem influences overall health and the risks it imposes  Reviewed plan for weight loss with lower calorie diet (via better food choices and also portion control or program like weight watchers) and exercise building up to or more than 30 minutes 5 days per week including some aerobic activity   Disc with his cousin/caregiver -some safe opt for exercise

## 2013-11-12 DIAGNOSIS — M79609 Pain in unspecified limb: Secondary | ICD-10-CM | POA: Diagnosis not present

## 2013-11-12 DIAGNOSIS — B351 Tinea unguium: Secondary | ICD-10-CM | POA: Diagnosis not present

## 2013-11-13 ENCOUNTER — Other Ambulatory Visit: Payer: Self-pay | Admitting: *Deleted

## 2013-11-13 MED ORDER — LISINOPRIL 5 MG PO TABS: ORAL_TABLET | ORAL | Status: DC

## 2013-11-13 MED ORDER — GEMFIBROZIL 600 MG PO TABS: ORAL_TABLET | ORAL | Status: DC

## 2013-11-13 NOTE — Telephone Encounter (Signed)
Pharmacy requesting Rx for 90 day supplies, done

## 2014-02-11 ENCOUNTER — Other Ambulatory Visit: Payer: Medicare Other

## 2014-02-12 ENCOUNTER — Other Ambulatory Visit (INDEPENDENT_AMBULATORY_CARE_PROVIDER_SITE_OTHER): Payer: Medicare Other

## 2014-02-12 DIAGNOSIS — R739 Hyperglycemia, unspecified: Secondary | ICD-10-CM

## 2014-02-12 DIAGNOSIS — E785 Hyperlipidemia, unspecified: Secondary | ICD-10-CM | POA: Diagnosis not present

## 2014-02-12 DIAGNOSIS — I10 Essential (primary) hypertension: Secondary | ICD-10-CM

## 2014-02-12 LAB — COMPREHENSIVE METABOLIC PANEL
ALT: 11 U/L (ref 0–53)
AST: 20 U/L (ref 0–37)
Albumin: 3.6 g/dL (ref 3.5–5.2)
Alkaline Phosphatase: 55 U/L (ref 39–117)
BUN: 17 mg/dL (ref 6–23)
CO2: 28 meq/L (ref 19–32)
CREATININE: 1 mg/dL (ref 0.4–1.5)
Calcium: 9.2 mg/dL (ref 8.4–10.5)
Chloride: 101 mEq/L (ref 96–112)
GFR: 93.47 mL/min (ref >60.00)
Glucose, Bld: 102 mg/dL — ABNORMAL HIGH (ref 70–99)
Potassium: 4.4 mEq/L (ref 3.5–5.1)
Sodium: 135 mEq/L (ref 135–145)
Total Bilirubin: 0.4 mg/dL (ref 0.2–1.2)
Total Protein: 7.9 g/dL (ref 6.0–8.3)

## 2014-02-12 LAB — HEMOGLOBIN A1C: HEMOGLOBIN A1C: 6.2 % (ref 4.6–6.5)

## 2014-02-12 LAB — LIPID PANEL
CHOLESTEROL: 164 mg/dL (ref 0–200)
HDL: 37.5 mg/dL — ABNORMAL LOW (ref >39.00)
LDL Cholesterol: 117 mg/dL — ABNORMAL HIGH (ref 0–99)
NonHDL: 126.5
TRIGLYCERIDES: 50 mg/dL (ref 0.0–149.0)
Total CHOL/HDL Ratio: 4
VLDL: 10 mg/dL (ref 0.0–40.0)

## 2014-02-18 ENCOUNTER — Ambulatory Visit (INDEPENDENT_AMBULATORY_CARE_PROVIDER_SITE_OTHER): Payer: Medicare Other | Admitting: Family Medicine

## 2014-02-18 ENCOUNTER — Encounter: Payer: Self-pay | Admitting: Family Medicine

## 2014-02-18 VITALS — BP 90/58 | HR 75 | Temp 98.0°F | Ht 66.5 in | Wt 209.5 lb

## 2014-02-18 DIAGNOSIS — I10 Essential (primary) hypertension: Secondary | ICD-10-CM

## 2014-02-18 DIAGNOSIS — R739 Hyperglycemia, unspecified: Secondary | ICD-10-CM

## 2014-02-18 DIAGNOSIS — Z23 Encounter for immunization: Secondary | ICD-10-CM | POA: Diagnosis not present

## 2014-02-18 DIAGNOSIS — E781 Pure hyperglyceridemia: Secondary | ICD-10-CM

## 2014-02-18 MED ORDER — LISINOPRIL 5 MG PO TABS: ORAL_TABLET | ORAL | Status: DC

## 2014-02-18 NOTE — Progress Notes (Signed)
Subjective:    Patient ID: Daniel Bailey, male    DOB: 24-Oct-1950, 63 y.o.   MRN: [id number]  HPI Here for follow up of chronic medical problem   Feeling ok  Nothing new going on with health -no complaints   Wt is down 2-3 lb  He has bee working on it  Is walking more - goes out to get the paper and walks in and outside  Watching diet more carefully  Less breads , and if he eats sweets - sugar free items , has stopped soft drinks  He does drink some prune juice  Not as much vegetables as he can  Lots of fruits   Hyperglycemia  Lab Results  Component Value Date   HGBA1C 6.2 02/12/2014   down from 6.4    bp is stable today  No cp or palpitations or headaches or edema  No side effects to medicines  BP Readings from Last 3 Encounters:  02/18/14 90/58  08/19/13 116/70  02/28/13 136/78     Cholesterol  Lab Results  Component Value Date   CHOL 164 02/12/2014   CHOL 177 08/11/2013   CHOL 164 02/28/2013   Lab Results  Component Value Date   HDL 37.50* 02/12/2014   HDL 38.00* 08/11/2013   HDL 42 02/28/2013   Lab Results  Component Value Date   LDLCALC 117* 02/12/2014   LDLCALC 125* 08/11/2013   LDLCALC 103* 02/28/2013   Lab Results  Component Value Date   TRIG 50.0 02/12/2014   TRIG 69.0 08/11/2013   TRIG 93 02/28/2013   Lab Results  Component Value Date   CHOLHDL 4 02/12/2014   CHOLHDL 5 08/11/2013   CHOLHDL 3.9 02/28/2013   Lab Results  Component Value Date   LDLDIRECT 175.3 08/16/2009   better diet has dec LDL HDL is also down - will try to walk more   Needs flu shot today   Patient Active Problem List   Diagnosis Date Noted  . Encounter for Medicare annual wellness exam 08/27/2012  . Obesity 08/18/2011  . Colon cancer screening 08/18/2011  . Routine general medical examination at a health care facility 08/10/2011  . Prostate cancer screening 08/10/2011  . Hyperglycemia 05/30/2010  . HYPERTENSION, BENIGN ESSENTIAL 12/10/2006  . Hypertriglyceridemia 11/23/2006    . MENTAL RETARDATION 11/23/2006   Past Medical History  Diagnosis Date  . Glaucoma   . Hyperlipidemia   . Hypertension   . Hyperglycemia    No past surgical history on file. History  Substance Use Topics  . Smoking status: Never Smoker   . Smokeless tobacco: Never Used  . Alcohol Use: Not on file   Family History  Problem Relation Age of Onset  . Colon cancer Mother    No Known Allergies Current Outpatient Prescriptions on File Prior to Visit  Medication Sig Dispense Refill  . gemfibrozil (LOPID) 600 MG tablet TAKE 1 TABLET BY MOUTH TWICE A DAY  180 tablet  2   No current facility-administered medications on file prior to visit.    Review of Systems Review of Systems  Constitutional: Negative for fever, appetite change, fatigue and unexpected weight change.  Eyes: Negative for pain and visual disturbance.  Respiratory: Negative for cough and shortness of breath.   Cardiovascular: Negative for cp or palpitations    Gastrointestinal: Negative for nausea, diarrhea and constipation.  Genitourinary: Negative for urgency and frequency.  Skin: Negative for pallor or rash   Neurological: Negative for weakness, light-headedness, numbness and headaches.  Hematological: Negative for adenopathy. Does not bruise/bleed easily.  Psychiatric/Behavioral: Negative for dysphoric mood. The patient is not nervous/anxious.         Objective:   Physical Exam  Constitutional: He appears well-developed and well-nourished. No distress.  obese and well appearing   HENT:  Head: Normocephalic and atraumatic.  Mouth/Throat: Oropharynx is clear and moist.  Eyes: Conjunctivae and EOM are normal. Pupils are equal, round, and reactive to light. No scleral icterus.  Neck: Normal range of motion. Neck supple. No JVD present. Carotid bruit is not present. No thyromegaly present.  Cardiovascular: Normal rate, regular rhythm, normal heart sounds and intact distal pulses.  Exam reveals no gallop.    Varicosities noted in lower legs Wearing supp hose to knee  Pulmonary/Chest: Effort normal and breath sounds normal. No respiratory distress. He has no wheezes. He exhibits no tenderness.  Abdominal: Soft. Bowel sounds are normal. He exhibits no distension, no abdominal bruit and no mass. There is no tenderness.  Musculoskeletal: He exhibits no edema and no tenderness.  Lymphadenopathy:    He has no cervical adenopathy.  Neurological: He is alert. He has normal reflexes. No cranial nerve deficit. He exhibits normal muscle tone. Coordination normal.  Skin: Skin is warm and dry. No rash noted. No erythema. No pallor.  Psychiatric: He has a normal mood and affect.          Assessment & Plan:   Problem List Items Addressed This Visit     Cardiovascular and Mediastinum   HYPERTENSION, BENIGN ESSENTIAL - Primary     bp is trending down  Asymptomatic  Will cut lisinopril to 1/2 of 5 mg pill and monitor  F/u 6 mo  Urged to keep up better health habits     Relevant Medications      lisinopril (PRINIVIL,ZESTRIL) tablet     Other   Hypertriglyceridemia     Some improvement with better diet  Disc goals for lipids and reasons to control them Rev labs with pt Rev low sat fat diet in detail Continues gemfibrozil F/u 6 mo      Relevant Medications      lisinopril (PRINIVIL,ZESTRIL) tablet   Hyperglycemia      Lab Results  Component Value Date   HGBA1C 6.2 02/12/2014   This is improved with better diet  His aunt is shopping smarter and making him walk more  Urged to keep up the good work F/u 6 mo     Other Visit Diagnoses   Need for prophylactic vaccination and inoculation against influenza        Relevant Orders       Flu Vaccine QUAD 36+ mos IM (Completed)

## 2014-02-18 NOTE — Assessment & Plan Note (Signed)
bp is trending down  Asymptomatic  Will cut lisinopril to 1/2 of 5 mg pill and monitor  F/u 6 mo  Urged to keep up better health habits

## 2014-02-18 NOTE — Assessment & Plan Note (Addendum)
Some improvement with better diet  Disc goals for lipids and reasons to control them Rev labs with pt Rev low sat fat diet in detail Continues gemfibrozil F/u 6 mo

## 2014-02-18 NOTE — Progress Notes (Signed)
Pre visit review using our clinic review tool, if applicable. No additional management support is needed unless otherwise documented below in the visit note. 

## 2014-02-18 NOTE — Patient Instructions (Signed)
Follow up in 6 months with labs prior for annual exam  Cut your lisinopril in 1/2 and take a half pill daily - your blood pressure is lower and you do not need as much  Try to walk more  Cholesterol and glucose control are both improved  Keep working on low sugar and low fat diet  More green vegetables also !  Flu vaccine today

## 2014-02-18 NOTE — Assessment & Plan Note (Signed)
Lab Results  Component Value Date   HGBA1C 6.2 02/12/2014   This is improved with better diet  His aunt is shopping smarter and making him walk more  Urged to keep up the good work F/u 6 mo

## 2014-02-19 DIAGNOSIS — E1169 Type 2 diabetes mellitus with other specified complication: Secondary | ICD-10-CM | POA: Insufficient documentation

## 2014-02-19 DIAGNOSIS — E781 Pure hyperglyceridemia: Secondary | ICD-10-CM

## 2014-02-19 DIAGNOSIS — E782 Mixed hyperlipidemia: Secondary | ICD-10-CM | POA: Insufficient documentation

## 2014-05-18 DIAGNOSIS — B351 Tinea unguium: Secondary | ICD-10-CM | POA: Diagnosis not present

## 2014-05-18 DIAGNOSIS — M79674 Pain in right toe(s): Secondary | ICD-10-CM | POA: Diagnosis not present

## 2014-05-18 DIAGNOSIS — M79675 Pain in left toe(s): Secondary | ICD-10-CM | POA: Diagnosis not present

## 2014-08-13 ENCOUNTER — Telehealth: Payer: Self-pay | Admitting: Family Medicine

## 2014-08-13 DIAGNOSIS — E781 Pure hyperglyceridemia: Secondary | ICD-10-CM

## 2014-08-13 DIAGNOSIS — I10 Essential (primary) hypertension: Secondary | ICD-10-CM

## 2014-08-13 DIAGNOSIS — R739 Hyperglycemia, unspecified: Secondary | ICD-10-CM

## 2014-08-13 DIAGNOSIS — Z125 Encounter for screening for malignant neoplasm of prostate: Secondary | ICD-10-CM

## 2014-08-13 NOTE — Telephone Encounter (Signed)
-----   Message from Ellamae Sia sent at 08/11/2014  3:25 PM EDT ----- Regarding: Lab orders for Friday, 4.8.16 Patient is scheduled for CPX labs, please order future labs, Thanks , Karna Christmas

## 2014-08-14 ENCOUNTER — Other Ambulatory Visit (INDEPENDENT_AMBULATORY_CARE_PROVIDER_SITE_OTHER): Payer: Medicare Other

## 2014-08-14 DIAGNOSIS — E781 Pure hyperglyceridemia: Secondary | ICD-10-CM

## 2014-08-14 DIAGNOSIS — Z125 Encounter for screening for malignant neoplasm of prostate: Secondary | ICD-10-CM | POA: Diagnosis not present

## 2014-08-14 DIAGNOSIS — R739 Hyperglycemia, unspecified: Secondary | ICD-10-CM

## 2014-08-14 DIAGNOSIS — I10 Essential (primary) hypertension: Secondary | ICD-10-CM

## 2014-08-14 LAB — CBC WITH DIFFERENTIAL/PLATELET
BASOS PCT: 0.7 % (ref 0.0–3.0)
Basophils Absolute: 0 10*3/uL (ref 0.0–0.1)
EOS ABS: 0.2 10*3/uL (ref 0.0–0.7)
Eosinophils Relative: 4.2 % (ref 0.0–5.0)
HCT: 35.9 % — ABNORMAL LOW (ref 39.0–52.0)
Hemoglobin: 12.4 g/dL — ABNORMAL LOW (ref 13.0–17.0)
LYMPHS PCT: 38.6 % (ref 12.0–46.0)
Lymphs Abs: 1.5 10*3/uL (ref 0.7–4.0)
MCHC: 34.6 g/dL (ref 30.0–36.0)
MCV: 86.3 fl (ref 78.0–100.0)
Monocytes Absolute: 0.4 10*3/uL (ref 0.1–1.0)
Monocytes Relative: 9.3 % (ref 3.0–12.0)
NEUTROS ABS: 1.9 10*3/uL (ref 1.4–7.7)
NEUTROS PCT: 47.2 % (ref 43.0–77.0)
PLATELETS: 353 10*3/uL (ref 150.0–400.0)
RBC: 4.16 Mil/uL — AB (ref 4.22–5.81)
RDW: 14.6 % (ref 11.5–15.5)
WBC: 4 10*3/uL (ref 4.0–10.5)

## 2014-08-14 LAB — COMPREHENSIVE METABOLIC PANEL
ALBUMIN: 4.3 g/dL (ref 3.5–5.2)
ALT: 10 U/L (ref 0–53)
AST: 18 U/L (ref 0–37)
Alkaline Phosphatase: 55 U/L (ref 39–117)
BILIRUBIN TOTAL: 0.4 mg/dL (ref 0.2–1.2)
BUN: 17 mg/dL (ref 6–23)
CHLORIDE: 101 meq/L (ref 96–112)
CO2: 26 meq/L (ref 19–32)
Calcium: 9.4 mg/dL (ref 8.4–10.5)
Creatinine, Ser: 1.03 mg/dL (ref 0.40–1.50)
GFR: 93.33 mL/min (ref >60.00)
Glucose, Bld: 99 mg/dL (ref 70–99)
Potassium: 4.3 mEq/L (ref 3.5–5.1)
SODIUM: 133 meq/L — AB (ref 135–145)
Total Protein: 7.6 g/dL (ref 6.0–8.3)

## 2014-08-14 LAB — LIPID PANEL
Cholesterol: 193 mg/dL (ref 0–200)
HDL: 40.2 mg/dL (ref >39.00)
LDL Cholesterol: 134 mg/dL — ABNORMAL HIGH (ref 0–99)
NONHDL: 152.8
Total CHOL/HDL Ratio: 5
Triglycerides: 92 mg/dL (ref 0.0–149.0)
VLDL: 18.4 mg/dL (ref 0.0–40.0)

## 2014-08-14 LAB — HEMOGLOBIN A1C: Hgb A1c MFr Bld: 6.3 % (ref 4.6–6.5)

## 2014-08-14 LAB — TSH: TSH: 4.36 u[IU]/mL (ref 0.35–4.50)

## 2014-08-14 LAB — PSA: PSA: 0.79 ng/mL (ref 0.10–4.00)

## 2014-08-21 ENCOUNTER — Encounter: Payer: Self-pay | Admitting: Family Medicine

## 2014-08-21 ENCOUNTER — Ambulatory Visit (INDEPENDENT_AMBULATORY_CARE_PROVIDER_SITE_OTHER): Payer: Medicare Other | Admitting: Family Medicine

## 2014-08-21 VITALS — BP 120/70 | HR 63 | Temp 98.2°F | Ht 68.0 in | Wt 214.8 lb

## 2014-08-21 DIAGNOSIS — E781 Pure hyperglyceridemia: Secondary | ICD-10-CM | POA: Diagnosis not present

## 2014-08-21 DIAGNOSIS — E669 Obesity, unspecified: Secondary | ICD-10-CM | POA: Diagnosis not present

## 2014-08-21 DIAGNOSIS — Z125 Encounter for screening for malignant neoplasm of prostate: Secondary | ICD-10-CM | POA: Diagnosis not present

## 2014-08-21 DIAGNOSIS — Z0000 Encounter for general adult medical examination without abnormal findings: Secondary | ICD-10-CM | POA: Diagnosis not present

## 2014-08-21 DIAGNOSIS — I10 Essential (primary) hypertension: Secondary | ICD-10-CM | POA: Diagnosis not present

## 2014-08-21 DIAGNOSIS — R739 Hyperglycemia, unspecified: Secondary | ICD-10-CM

## 2014-08-21 NOTE — Patient Instructions (Signed)
If you are interested in a shingles/zoster vaccine - call your insurance to check on coverage,( you should not get it within 1 month of other vaccines) , then call us for a prescription  for it to take to a pharmacy that gives the shot , or make a nurse visit to get it here depending on your coverage  keep working on exercise and staying active Low fat diet for cholesterol (Avoid red meat/ fried foods/ egg yolks/ fatty breakfast meats/ butter, cheese and high fat dairy/ and shellfish ) Low sugar diet also   Labs are fairly stable

## 2014-08-21 NOTE — Progress Notes (Signed)
Subjective:    Patient ID: Daniel Bailey, male    DOB: 1950-10-18, 64 y.o.   MRN: [id number]  HPI Here for annual medicare wellness visit as well as chronic/acute medical problems   I have personally reviewed the Medicare Annual Wellness questionnaire and have noted 1. The patient's medical and social history 2. Their use of alcohol, tobacco or illicit drugs 3. Their current medications and supplements 4. The patient's functional ability including ADL's, fall risks, home safety risks and hearing or visual             impairment. 5. Diet and physical activities 6. Evidence for depression or mood disorders  The patients weight, height, BMI have been recorded in the chart and visual acuity is per eye clinic.  I have made referrals, counseling and provided education to the patient based review of the above and I have provided the pt with a written personalized care plan for preventive services  Doing ok - taking care of himself Gained 5 lb  Not buying as much sweets and soft drinks - except when out to eat - then has a diet drink  Gets outdoors a bit  Some walking   BP Readings from Last 3 Encounters:  08/21/14 138/80  02/18/14 90/58  08/19/13 116/70   .re check today 120/70  See scanned forms.  Routine anticipatory guidance given to patient.  See health maintenance. Colon cancer screening 12/13 colonosc- 5 year recall for fam hx  Flu vaccine 10/15 Tetanus vaccine 7/11  Pneumovax -will get when he turns 64  Zoster vaccine - not affordable  Right now  Prostate cancer screening  Lab Results  Component Value Date   PSA 0.79 08/14/2014   PSA 0.75 08/11/2013   PSA 0.83 08/20/2012   no symptoms or frequent urination  Advance directive - has not done yet- has seen a lawyer  Cognitive function addressed- see scanned forms- and if abnormal then additional documentation follows.  No memory changes  No hearing problems   PMH and SH reviewed  Meds, vitals, and allergies reviewed.    ROS: See HPI.  Otherwise negative.      Hypertriglyceridemia/hyperlipidemia Lab Results  Component Value Date   CHOL 193 08/14/2014   CHOL 164 02/12/2014   CHOL 177 08/11/2013   Lab Results  Component Value Date   HDL 40.20 08/14/2014   HDL 37.50* 02/12/2014   HDL 38.00* 08/11/2013   Lab Results  Component Value Date   LDLCALC 134* 08/14/2014   LDLCALC 117* 02/12/2014   LDLCALC 125* 08/11/2013   Lab Results  Component Value Date   TRIG 92.0 08/14/2014   TRIG 50.0 02/12/2014   TRIG 69.0 08/11/2013   Lab Results  Component Value Date   CHOLHDL 5 08/14/2014   CHOLHDL 4 02/12/2014   CHOLHDL 5 08/11/2013   Lab Results  Component Value Date   LDLDIRECT 175.3 08/16/2009   pretty stable but LDL is up a bit  Does watch fats in diet     Stable cbc Lab Results  Component Value Date   WBC 4.0 08/14/2014   HGB 12.4* 08/14/2014   HCT 35.9* 08/14/2014   MCV 86.3 08/14/2014   PLT 353.0 08/14/2014    Psa is stable Lab Results  Component Value Date   PSA 0.79 08/14/2014   PSA 0.75 08/11/2013   PSA 0.83 08/20/2012    Hyperglycemia Lab Results  Component Value Date   HGBA1C 6.3 08/14/2014   last time 6.2  Diet is controlled  -  continue to watch     Chemistry      Component Value Date/Time   NA 133* 08/14/2014 0834   K 4.3 08/14/2014 0834   CL 101 08/14/2014 0834   CO2 26 08/14/2014 0834   BUN 17 08/14/2014 0834   CREATININE 1.03 08/14/2014 0834      Component Value Date/Time   CALCIUM 9.4 08/14/2014 0834   ALKPHOS 55 08/14/2014 0834   AST 18 08/14/2014 0834   ALT 10 08/14/2014 0834   BILITOT 0.4 08/14/2014 0834      Lab Results  Component Value Date   TSH 4.36 08/14/2014     Patient Active Problem List   Diagnosis Date Noted  . Hypertriglyceridemia, essential 02/19/2014  . Encounter for Medicare annual wellness exam 08/27/2012  . Obesity 08/18/2011  . Colon cancer screening 08/18/2011  . Routine general medical examination at a health  care facility 08/10/2011  . Prostate cancer screening 08/10/2011  . Hyperglycemia 05/30/2010  . HYPERTENSION, BENIGN ESSENTIAL 12/10/2006  . Hypertriglyceridemia 11/23/2006  . MENTAL RETARDATION 11/23/2006   Past Medical History  Diagnosis Date  . Glaucoma   . Hyperlipidemia   . Hypertension   . Hyperglycemia    No past surgical history on file. History  Substance Use Topics  . Smoking status: Never Smoker   . Smokeless tobacco: Never Used  . Alcohol Use: Not on file   Family History  Problem Relation Age of Onset  . Colon cancer Mother    No Known Allergies Current Outpatient Prescriptions on File Prior to Visit  Medication Sig Dispense Refill  . gemfibrozil (LOPID) 600 MG tablet TAKE 1 TABLET BY MOUTH TWICE A DAY 180 tablet 2  . lisinopril (PRINIVIL,ZESTRIL) 5 MG tablet TAKE 1/2 tablet BY MOUTH EVERY DAY 45 tablet 3   No current facility-administered medications on file prior to visit.    Review of Systems Review of Systems  Constitutional: Negative for fever, appetite change, fatigue and unexpected weight change.  Eyes: Negative for pain and visual disturbance.  Respiratory: Negative for cough and shortness of breath.   Cardiovascular: Negative for cp or palpitations    Gastrointestinal: Negative for nausea, diarrhea and constipation.  Genitourinary: Negative for urgency and frequency.  Skin: Negative for pallor or rash   Neurological: Negative for weakness, light-headedness, numbness and headaches.  Hematological: Negative for adenopathy. Does not bruise/bleed easily.  Psychiatric/Behavioral: Negative for dysphoric mood. The patient is not nervous/anxious.         Objective:   Physical Exam  Constitutional: He appears well-developed and well-nourished. No distress.  obese and well appearing   HENT:  Head: Normocephalic and atraumatic.  Right Ear: External ear normal.  Left Ear: External ear normal.  Nose: Nose normal.  Mouth/Throat: Oropharynx is clear and  moist.  Eyes: Conjunctivae and EOM are normal. Pupils are equal, round, and reactive to light. Right eye exhibits no discharge. Left eye exhibits no discharge. No scleral icterus.  Neck: Normal range of motion. Neck supple. No JVD present. Carotid bruit is not present. No thyromegaly present.  Cardiovascular: Normal rate, regular rhythm, normal heart sounds and intact distal pulses.  Exam reveals no gallop.   Pulmonary/Chest: Effort normal and breath sounds normal. No respiratory distress. He has no wheezes. He exhibits no tenderness.  Abdominal: Soft. Bowel sounds are normal. He exhibits no distension, no abdominal bruit and no mass. There is no tenderness.  Musculoskeletal: He exhibits no edema or tenderness.  Lymphadenopathy:    He has no cervical  adenopathy.  Neurological: He is alert. He has normal reflexes. No cranial nerve deficit. He exhibits normal muscle tone. Coordination normal.  Skin: Skin is warm and dry. No rash noted. No erythema. No pallor.  Psychiatric: He has a normal mood and affect.  Baseline MR- pleasant and talkative  Mother is present to help out with history          Assessment & Plan:   Problem List Items Addressed This Visit      Cardiovascular and Mediastinum   HYPERTENSION, BENIGN ESSENTIAL - Primary    bp in fair control at this time  BP Readings from Last 1 Encounters:  08/21/14 120/70   No changes needed Disc lifstyle change with low sodium diet and exercise  Labs reviewed         Other   Encounter for Medicare annual wellness exam    Reviewed health habits including diet and exercise and skin cancer prevention Reviewed appropriate screening tests for age  Also reviewed health mt list, fam hx and immunization status , as well as social and family history   See HPI Labs reviewed  If you are interested in a shingles/zoster vaccine - call your insurance to check on coverage,( you should not get it within 1 month of other vaccines) , then call us  for a prescription  for it to take to a pharmacy that gives the shot , or make a nurse visit to get it here depending on your coverage  keep working on exercise and staying active Low fat diet for cholesterol (Avoid red meat/ fried foods/ egg yolks/ fatty breakfast meats/ butter, cheese and high fat dairy/ and shellfish ) Low sugar diet also Work on advance directive as we discussed       Hyperglycemia    Lab Results  Component Value Date   HGBA1C 6.3 08/14/2014   Overall up slightly Has DM in family and obesity  Disc imp of low glycemic diet and exercise -also for wt loss  His caregivers are watching diet more closely ie: what foods they do prep and bring into the house       Hypertriglyceridemia    Well controlled with gemfibrozil and diet Disc goals for lipids and reasons to control them Rev labs with pt Rev low sat fat diet in detail       Obesity    Discussed how this problem influences overall health and the risks it imposes  Reviewed plan for weight loss with lower calorie diet (via better food choices and also portion control or program like weight watchers) and exercise building up to or more than 30 minutes 5 days per week including some aerobic activity   Disc with his caregivers-will make an effort to get him out for more exercise        Prostate cancer screening    Lab Results  Component Value Date   PSA 0.79 08/14/2014   PSA 0.75 08/11/2013   PSA 0.83 08/20/2012   No urinary symptoms  No fam hx of prostate cancer  Will continue to follow

## 2014-08-21 NOTE — Progress Notes (Signed)
Pre visit review using our clinic review tool, if applicable. No additional management support is needed unless otherwise documented below in the visit note. 

## 2014-08-23 NOTE — Assessment & Plan Note (Signed)
Lab Results  Component Value Date   HGBA1C 6.3 08/14/2014   Overall up slightly Has DM in family and obesity  Disc imp of low glycemic diet and exercise -also for wt loss  His caregivers are watching diet more closely ie: what foods they do prep and bring into the house

## 2014-08-23 NOTE — Assessment & Plan Note (Signed)
bp in fair control at this time  BP Readings from Last 1 Encounters:  08/21/14 120/70   No changes needed Disc lifstyle change with low sodium diet and exercise  Labs reviewed

## 2014-08-23 NOTE — Assessment & Plan Note (Signed)
Discussed how this problem influences overall health and the risks it imposes  Reviewed plan for weight loss with lower calorie diet (via better food choices and also portion control or program like weight watchers) and exercise building up to or more than 30 minutes 5 days per week including some aerobic activity   Disc with his caregivers-will make an effort to get him out for more exercise

## 2014-08-23 NOTE — Assessment & Plan Note (Signed)
Lab Results  Component Value Date   PSA 0.79 08/14/2014   PSA 0.75 08/11/2013   PSA 0.83 08/20/2012   No urinary symptoms  No fam hx of prostate cancer  Will continue to follow

## 2014-08-23 NOTE — Assessment & Plan Note (Signed)
Reviewed health habits including diet and exercise and skin cancer prevention Reviewed appropriate screening tests for age  Also reviewed health mt list, fam hx and immunization status , as well as social and family history   See HPI Labs reviewed  If you are interested in a shingles/zoster vaccine - call your insurance to check on coverage,( you should not get it within 1 month of other vaccines) , then call us for a prescription  for it to take to a pharmacy that gives the shot , or make a nurse visit to get it here depending on your coverage  keep working on exercise and staying active Low fat diet for cholesterol (Avoid red meat/ fried foods/ egg yolks/ fatty breakfast meats/ butter, cheese and high fat dairy/ and shellfish ) Low sugar diet also Work on advance directive as we discussed

## 2014-08-23 NOTE — Assessment & Plan Note (Signed)
Well controlled with gemfibrozil and diet Disc goals for lipids and reasons to control them Rev labs with pt Rev low sat fat diet in detail

## 2014-10-02 ENCOUNTER — Other Ambulatory Visit: Payer: Self-pay | Admitting: Family Medicine

## 2014-11-04 DIAGNOSIS — M79675 Pain in left toe(s): Secondary | ICD-10-CM | POA: Diagnosis not present

## 2014-11-04 DIAGNOSIS — M79674 Pain in right toe(s): Secondary | ICD-10-CM | POA: Diagnosis not present

## 2014-11-04 DIAGNOSIS — B351 Tinea unguium: Secondary | ICD-10-CM | POA: Diagnosis not present

## 2015-02-14 ENCOUNTER — Telehealth: Payer: Self-pay | Admitting: Family Medicine

## 2015-02-14 DIAGNOSIS — Z125 Encounter for screening for malignant neoplasm of prostate: Secondary | ICD-10-CM

## 2015-02-14 DIAGNOSIS — R739 Hyperglycemia, unspecified: Secondary | ICD-10-CM

## 2015-02-14 DIAGNOSIS — I10 Essential (primary) hypertension: Secondary | ICD-10-CM

## 2015-02-14 DIAGNOSIS — E781 Pure hyperglyceridemia: Secondary | ICD-10-CM

## 2015-02-14 NOTE — Telephone Encounter (Signed)
-----   Message from Ellamae Sia sent at 02/10/2015  2:57 PM EDT ----- Regarding: Lab orders for Monday, 10.10.16 Lab orders for f/u labs

## 2015-02-15 ENCOUNTER — Other Ambulatory Visit (INDEPENDENT_AMBULATORY_CARE_PROVIDER_SITE_OTHER): Payer: Medicare Other

## 2015-02-15 DIAGNOSIS — I10 Essential (primary) hypertension: Secondary | ICD-10-CM | POA: Diagnosis not present

## 2015-02-15 DIAGNOSIS — E781 Pure hyperglyceridemia: Secondary | ICD-10-CM | POA: Diagnosis not present

## 2015-02-15 DIAGNOSIS — R739 Hyperglycemia, unspecified: Secondary | ICD-10-CM

## 2015-02-15 LAB — COMPREHENSIVE METABOLIC PANEL
ALBUMIN: 3.9 g/dL (ref 3.5–5.2)
ALK PHOS: 59 U/L (ref 39–117)
ALT: 8 U/L (ref 0–53)
AST: 13 U/L (ref 0–37)
BUN: 16 mg/dL (ref 6–23)
CHLORIDE: 101 meq/L (ref 96–112)
CO2: 28 mEq/L (ref 19–32)
CREATININE: 0.94 mg/dL (ref 0.40–1.50)
Calcium: 9.3 mg/dL (ref 8.4–10.5)
GFR: 103.55 mL/min (ref >60.00)
GLUCOSE: 114 mg/dL — AB (ref 70–99)
Potassium: 3.9 mEq/L (ref 3.5–5.1)
SODIUM: 135 meq/L (ref 135–145)
TOTAL PROTEIN: 7.4 g/dL (ref 6.0–8.3)
Total Bilirubin: 0.4 mg/dL (ref 0.2–1.2)

## 2015-02-15 LAB — LIPID PANEL
CHOL/HDL RATIO: 4
Cholesterol: 170 mg/dL (ref 0–200)
HDL: 38.8 mg/dL — ABNORMAL LOW (ref >39.00)
LDL Cholesterol: 111 mg/dL — ABNORMAL HIGH (ref 0–99)
NONHDL: 130.72
Triglycerides: 99 mg/dL (ref 0.0–149.0)
VLDL: 19.8 mg/dL (ref 0.0–40.0)

## 2015-02-15 LAB — HEMOGLOBIN A1C: HEMOGLOBIN A1C: 6.6 % — AB (ref 4.6–6.5)

## 2015-02-22 ENCOUNTER — Encounter: Payer: Self-pay | Admitting: Family Medicine

## 2015-02-22 ENCOUNTER — Ambulatory Visit (INDEPENDENT_AMBULATORY_CARE_PROVIDER_SITE_OTHER): Payer: Medicare Other | Admitting: Family Medicine

## 2015-02-22 VITALS — BP 118/70 | HR 72 | Temp 97.9°F | Ht 66.5 in | Wt 206.8 lb

## 2015-02-22 DIAGNOSIS — I10 Essential (primary) hypertension: Secondary | ICD-10-CM | POA: Diagnosis not present

## 2015-02-22 DIAGNOSIS — E669 Obesity, unspecified: Secondary | ICD-10-CM | POA: Diagnosis not present

## 2015-02-22 DIAGNOSIS — Z23 Encounter for immunization: Secondary | ICD-10-CM | POA: Diagnosis not present

## 2015-02-22 DIAGNOSIS — R739 Hyperglycemia, unspecified: Secondary | ICD-10-CM | POA: Diagnosis not present

## 2015-02-22 DIAGNOSIS — E781 Pure hyperglyceridemia: Secondary | ICD-10-CM | POA: Diagnosis not present

## 2015-02-22 NOTE — Assessment & Plan Note (Signed)
Controlled with lopid and diet Disc goals for lipids and reasons to control them Rev labs with pt Rev low sat fat diet in detail

## 2015-02-22 NOTE — Assessment & Plan Note (Signed)
Discussed how this problem influences overall health and the risks it imposes  Reviewed plan for weight loss with lower calorie diet (via better food choices and also portion control or program like weight watchers) and exercise building up to or more than 30 minutes 5 days per week including some aerobic activity   Commended on 8 lb lost so far  Outlined ways to safely get more exercise

## 2015-02-22 NOTE — Patient Instructions (Signed)
A1C is up to 6.6 - just at the border for diabetes Cut starches (carbohydrates) to smaller portions Eliminate sweets and sugar beverages (including juices) Try to encourage exercise-more walking and activity in general    Flu shot today   Follow up in 6 months for annual exam with labs prior

## 2015-02-22 NOTE — Assessment & Plan Note (Signed)
Lab Results  Component Value Date   HGBA1C 6.6* 02/15/2015   This is up from 6.3 Disc DM and ways to prevent worsening - ie: more wt loss/ lower carb diet and exercise Handouts given to family who preps meals [id number] counting and low glycemic diet F/u 6 mo

## 2015-02-22 NOTE — Progress Notes (Signed)
Pre visit review using our clinic review tool, if applicable. No additional management support is needed unless otherwise documented below in the visit note. 

## 2015-02-22 NOTE — Progress Notes (Signed)
Subjective:    Patient ID: Daniel Bailey, male    DOB: May 03, 1951, 64 y.o.   MRN: [id number]  HPI Here for f/u of chronic medical problems   Doing well overall  Trying to take care of himself and also helping his mother  Nothing new with health    Wt is down 8 lb with bmi of 32 Still in obese range  Eating a little better - shopping differently   bp is stable today  No cp or palpitations or headaches or edema  No side effects to medicines  BP Readings from Last 3 Encounters:  02/22/15 118/70  08/21/14 120/70  02/18/14 90/58     Good control   Hx of hypertriglyceridemia  On gemfibrozil Lab Results  Component Value Date   CHOL 170 02/15/2015   CHOL 193 08/14/2014   CHOL 164 02/12/2014   Lab Results  Component Value Date   HDL 38.80* 02/15/2015   HDL 40.20 08/14/2014   HDL 37.50* 02/12/2014   Lab Results  Component Value Date   LDLCALC 111* 02/15/2015   LDLCALC 134* 08/14/2014   LDLCALC 117* 02/12/2014   Lab Results  Component Value Date   TRIG 99.0 02/15/2015   TRIG 92.0 08/14/2014   TRIG 50.0 02/12/2014   Lab Results  Component Value Date   CHOLHDL 4 02/15/2015   CHOLHDL 5 08/14/2014   CHOLHDL 4 02/12/2014   Lab Results  Component Value Date   LDLDIRECT 175.3 08/16/2009  overall stable but LDL went down  Not really any exercise  Family is looking into a program in [place] to get him out for activities more    Hx of hyperglycemia Lab Results  Component Value Date   HGBA1C 6.6* 02/15/2015   this is up from 6.3  Diet review - does eat bread  Not much pasta/rice (once per week) occ potatoes  Some sweets  - likes pies   Patient Active Problem List   Diagnosis Date Noted  . Hypertriglyceridemia, essential 02/19/2014  . Encounter for Medicare annual wellness exam 08/27/2012  . Obesity 08/18/2011  . Colon cancer screening 08/18/2011  . Routine general medical examination at a health care facility 08/10/2011  . Prostate cancer screening 08/10/2011    . Hyperglycemia 05/30/2010  . HYPERTENSION, BENIGN ESSENTIAL 12/10/2006  . Hypertriglyceridemia 11/23/2006  . MENTAL RETARDATION 11/23/2006   Past Medical History  Diagnosis Date  . Glaucoma   . Hyperlipidemia   . Hypertension   . Hyperglycemia    No past surgical history on file. Social History  Substance Use Topics  . Smoking status: Never Smoker   . Smokeless tobacco: Never Used  . Alcohol Use: No   Family History  Problem Relation Age of Onset  . Colon cancer Mother    No Known Allergies Current Outpatient Prescriptions on File Prior to Visit  Medication Sig Dispense Refill  . gemfibrozil (LOPID) 600 MG tablet TAKE 1 TABLET BY MOUTH TWICE A DAY 180 tablet 1  . lisinopril (PRINIVIL,ZESTRIL) 5 MG tablet TAKE 1/2 tablet BY MOUTH EVERY DAY 45 tablet 3   No current facility-administered medications on file prior to visit.      Flu vaccine - due for  Review of Systems Review of Systems  Constitutional: Negative for fever, appetite change, fatigue and unexpected weight change.  Eyes: Negative for pain and visual disturbance.  Respiratory: Negative for cough and shortness of breath.   Cardiovascular: Negative for cp or palpitations    Gastrointestinal: Negative for nausea, diarrhea  and constipation.  Genitourinary: Negative for urgency and frequency.  Skin: Negative for pallor or rash   Neurological: Negative for weakness, light-headedness, numbness and headaches.  Hematological: Negative for adenopathy. Does not bruise/bleed easily.  Psychiatric/Behavioral: Negative for dysphoric mood. The patient is not nervous/anxious.         Objective:   Physical Exam  Constitutional: He appears well-developed and well-nourished. No distress.  obese and well appearing   HENT:  Head: Normocephalic and atraumatic.  Mouth/Throat: Oropharynx is clear and moist.  Eyes: Conjunctivae and EOM are normal. Pupils are equal, round, and reactive to light.  Neck: Normal range of motion.  Neck supple. No JVD present. Carotid bruit is not present. No thyromegaly present.  Cardiovascular: Normal rate, regular rhythm, normal heart sounds and intact distal pulses.  Exam reveals no gallop.   Pulmonary/Chest: Effort normal and breath sounds normal. No respiratory distress. He has no wheezes. He has no rales.  No crackles  Abdominal: Soft. Bowel sounds are normal. He exhibits no distension, no abdominal bruit and no mass. There is no tenderness.  Musculoskeletal: He exhibits no edema.  Lymphadenopathy:    He has no cervical adenopathy.  Neurological: He is alert. He has normal reflexes.  Skin: Skin is warm and dry. No rash noted.  Psychiatric: He has a normal mood and affect.  Baseline MR Pleasant Answers questions appropriately          Assessment & Plan:   Problem List Items Addressed This Visit      Cardiovascular and Mediastinum   HYPERTENSION, BENIGN ESSENTIAL - Primary    bp in fair control at this time  BP Readings from Last 1 Encounters:  02/22/15 118/70   No changes needed Disc lifstyle change with low sodium diet and exercise   Labs reviewed  Enc more wt loss         Other   Hyperglycemia    Lab Results  Component Value Date   HGBA1C 6.6* 02/15/2015   This is up from 6.3 Disc DM and ways to prevent worsening - ie: more wt loss/ lower carb diet and exercise Handouts given to family who preps meals [id number] counting and low glycemic diet F/u 6 mo       Hypertriglyceridemia    Controlled with lopid and diet Disc goals for lipids and reasons to control them Rev labs with pt Rev low sat fat diet in detail       Obesity    Discussed how this problem influences overall health and the risks it imposes  Reviewed plan for weight loss with lower calorie diet (via better food choices and also portion control or program like weight watchers) and exercise building up to or more than 30 minutes 5 days per week including some aerobic activity    Commended on 8 lb lost so far  Outlined ways to safely get more exercise        Other Visit Diagnoses    Need for influenza vaccination        Relevant Orders    Flu Vaccine QUAD 36+ mos PF IM (Fluarix & Fluzone Quad PF) (Completed)

## 2015-02-22 NOTE — Assessment & Plan Note (Signed)
bp in fair control at this time  BP Readings from Last 1 Encounters:  02/22/15 118/70   No changes needed Disc lifstyle change with low sodium diet and exercise   Labs reviewed  Enc more wt loss

## 2015-04-13 ENCOUNTER — Other Ambulatory Visit: Payer: Self-pay | Admitting: Family Medicine

## 2015-06-06 ENCOUNTER — Other Ambulatory Visit: Payer: Self-pay | Admitting: Family Medicine

## 2015-06-16 DIAGNOSIS — M79675 Pain in left toe(s): Secondary | ICD-10-CM | POA: Diagnosis not present

## 2015-06-16 DIAGNOSIS — M79674 Pain in right toe(s): Secondary | ICD-10-CM | POA: Diagnosis not present

## 2015-06-16 DIAGNOSIS — B351 Tinea unguium: Secondary | ICD-10-CM | POA: Diagnosis not present

## 2015-08-14 ENCOUNTER — Telehealth: Payer: Self-pay | Admitting: Family Medicine

## 2015-08-14 DIAGNOSIS — R739 Hyperglycemia, unspecified: Secondary | ICD-10-CM

## 2015-08-14 DIAGNOSIS — I10 Essential (primary) hypertension: Secondary | ICD-10-CM

## 2015-08-14 DIAGNOSIS — E781 Pure hyperglyceridemia: Secondary | ICD-10-CM

## 2015-08-14 DIAGNOSIS — Z125 Encounter for screening for malignant neoplasm of prostate: Secondary | ICD-10-CM

## 2015-08-14 NOTE — Telephone Encounter (Signed)
-----   Message from Marchia Bond sent at 08/12/2015  2:33 PM EDT ----- Regarding: Cpx labs Wed 4/12, need orders. Thanks! :-) Please order  future cpx labs for pt's upcoming lab appt. Thanks Aniceto Boss

## 2015-08-18 ENCOUNTER — Other Ambulatory Visit (INDEPENDENT_AMBULATORY_CARE_PROVIDER_SITE_OTHER): Payer: Medicare Other

## 2015-08-18 DIAGNOSIS — R739 Hyperglycemia, unspecified: Secondary | ICD-10-CM | POA: Diagnosis not present

## 2015-08-18 DIAGNOSIS — E781 Pure hyperglyceridemia: Secondary | ICD-10-CM

## 2015-08-18 DIAGNOSIS — I10 Essential (primary) hypertension: Secondary | ICD-10-CM

## 2015-08-18 DIAGNOSIS — Z125 Encounter for screening for malignant neoplasm of prostate: Secondary | ICD-10-CM

## 2015-08-18 LAB — LIPID PANEL
CHOL/HDL RATIO: 4
Cholesterol: 184 mg/dL (ref 0–200)
HDL: 43.1 mg/dL (ref >39.00)
LDL CALC: 122 mg/dL — AB (ref 0–99)
NONHDL: 141.19
Triglycerides: 96 mg/dL (ref 0.0–149.0)
VLDL: 19.2 mg/dL (ref 0.0–40.0)

## 2015-08-18 LAB — COMPREHENSIVE METABOLIC PANEL
ALK PHOS: 60 U/L (ref 39–117)
ALT: 11 U/L (ref 0–53)
AST: 15 U/L (ref 0–37)
Albumin: 4 g/dL (ref 3.5–5.2)
BILIRUBIN TOTAL: 0.4 mg/dL (ref 0.2–1.2)
BUN: 18 mg/dL (ref 6–23)
CO2: 30 meq/L (ref 19–32)
Calcium: 9 mg/dL (ref 8.4–10.5)
Chloride: 102 mEq/L (ref 96–112)
Creatinine, Ser: 1.02 mg/dL (ref 0.40–1.50)
GFR: 94.09 mL/min (ref >60.00)
GLUCOSE: 111 mg/dL — AB (ref 70–99)
Potassium: 4.2 mEq/L (ref 3.5–5.1)
SODIUM: 137 meq/L (ref 135–145)
TOTAL PROTEIN: 6.8 g/dL (ref 6.0–8.3)

## 2015-08-18 LAB — CBC WITH DIFFERENTIAL/PLATELET
BASOS ABS: 0 10*3/uL (ref 0.0–0.1)
Basophils Relative: 0.5 % (ref 0.0–3.0)
EOS ABS: 0.1 10*3/uL (ref 0.0–0.7)
Eosinophils Relative: 2.7 % (ref 0.0–5.0)
HCT: 35.9 % — ABNORMAL LOW (ref 39.0–52.0)
Hemoglobin: 12.2 g/dL — ABNORMAL LOW (ref 13.0–17.0)
LYMPHS ABS: 1.6 10*3/uL (ref 0.7–4.0)
Lymphocytes Relative: 42.3 % (ref 12.0–46.0)
MCHC: 34 g/dL (ref 30.0–36.0)
MCV: 86.7 fl (ref 78.0–100.0)
MONO ABS: 0.4 10*3/uL (ref 0.1–1.0)
MONOS PCT: 9.3 % (ref 3.0–12.0)
NEUTROS PCT: 45.2 % (ref 43.0–77.0)
Neutro Abs: 1.7 10*3/uL (ref 1.4–7.7)
Platelets: 282 10*3/uL (ref 150.0–400.0)
RBC: 4.14 Mil/uL — AB (ref 4.22–5.81)
RDW: 14.4 % (ref 11.5–15.5)
WBC: 3.9 10*3/uL — ABNORMAL LOW (ref 4.0–10.5)

## 2015-08-18 LAB — TSH: TSH: 3.31 u[IU]/mL (ref 0.35–4.50)

## 2015-08-18 LAB — PSA: PSA: 0.95 ng/mL (ref 0.10–4.00)

## 2015-08-18 LAB — HEMOGLOBIN A1C: Hgb A1c MFr Bld: 6.3 % (ref 4.6–6.5)

## 2015-08-25 ENCOUNTER — Ambulatory Visit (INDEPENDENT_AMBULATORY_CARE_PROVIDER_SITE_OTHER): Payer: Medicare Other | Admitting: Family Medicine

## 2015-08-25 ENCOUNTER — Encounter: Payer: Self-pay | Admitting: Family Medicine

## 2015-08-25 VITALS — BP 126/86 | HR 61 | Temp 98.2°F | Ht 67.5 in | Wt 207.0 lb

## 2015-08-25 DIAGNOSIS — Z0000 Encounter for general adult medical examination without abnormal findings: Secondary | ICD-10-CM

## 2015-08-25 DIAGNOSIS — E781 Pure hyperglyceridemia: Secondary | ICD-10-CM

## 2015-08-25 DIAGNOSIS — E669 Obesity, unspecified: Secondary | ICD-10-CM

## 2015-08-25 DIAGNOSIS — I10 Essential (primary) hypertension: Secondary | ICD-10-CM

## 2015-08-25 DIAGNOSIS — Z125 Encounter for screening for malignant neoplasm of prostate: Secondary | ICD-10-CM

## 2015-08-25 DIAGNOSIS — Z23 Encounter for immunization: Secondary | ICD-10-CM

## 2015-08-25 DIAGNOSIS — R739 Hyperglycemia, unspecified: Secondary | ICD-10-CM | POA: Diagnosis not present
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
## 2015-08-25 NOTE — Patient Instructions (Addendum)
Pneumonia vaccine today (prevnar)  Get a flu shot every fall Try to be as active as you can and work on weight loss  Labs are stable - cholesterol is up a little (Avoid red meat/ fried foods/ egg yolks/ fatty breakfast meats/ butter, cheese and high fat dairy/ and shellfish) Follow up in 6 months with labs prior

## 2015-08-25 NOTE — Progress Notes (Signed)
Pre visit review using our clinic review tool, if applicable. No additional management support is needed unless otherwise documented below in the visit note. 

## 2015-08-26 NOTE — Assessment & Plan Note (Signed)
Continue lopid and diet Disc goals for lipids and reasons to control them Rev labs with pt Rev low sat fat diet in detail

## 2015-08-26 NOTE — Assessment & Plan Note (Signed)
Reviewed health habits including diet and exercise and skin cancer prevention Reviewed appropriate screening tests for age  Also reviewed health mt list, fam hx and immunization status , as well as social and family history   See HPI Labs reviewed  Pneumonia vaccine today (prevnar)  Get a flu shot every fall Try to be as active as you can and work on weight loss  Labs are stable - cholesterol is up a little (Avoid red meat/ fried foods/ egg yolks/ fatty breakfast meats/ butter, cheese and high fat dairy/ and shellfish) Follow up in 6 months with labs prior

## 2015-08-26 NOTE — Assessment & Plan Note (Signed)
Discussed how this problem influences overall health and the risks it imposes  Reviewed plan for weight loss with lower calorie diet (via better food choices and also portion control or program like weight watchers) and exercise building up to or more than 30 minutes 5 days per week including some aerobic activity    

## 2015-08-26 NOTE — Assessment & Plan Note (Signed)
bp in fair control at this time  BP Readings from Last 1 Encounters:  08/25/15 126/86   No changes needed Disc lifstyle change with low sodium diet and exercise  Labs reviewed

## 2015-08-26 NOTE — Assessment & Plan Note (Signed)
Lab Results  Component Value Date   PSA 0.95 08/18/2015   PSA 0.79 08/14/2014   PSA 0.75 08/11/2013   Stable  No symptoms  Pos family hx -will continue to monitor

## 2015-08-26 NOTE — Assessment & Plan Note (Signed)
Lab Results  Component Value Date   HGBA1C 6.3 08/18/2015   This is improved with better diet  Enc wt loss/ low glycemic diet and exercise F/u 6 mo

## 2015-09-23 ENCOUNTER — Other Ambulatory Visit: Payer: Self-pay | Admitting: Family Medicine

## 2015-10-24 ENCOUNTER — Other Ambulatory Visit: Payer: Self-pay | Admitting: Family Medicine

## 2016-01-03 DIAGNOSIS — M79675 Pain in left toe(s): Secondary | ICD-10-CM | POA: Diagnosis not present

## 2016-01-03 DIAGNOSIS — M79674 Pain in right toe(s): Secondary | ICD-10-CM | POA: Diagnosis not present

## 2016-01-03 DIAGNOSIS — B351 Tinea unguium: Secondary | ICD-10-CM | POA: Diagnosis not present

## 2016-02-18 ENCOUNTER — Other Ambulatory Visit (INDEPENDENT_AMBULATORY_CARE_PROVIDER_SITE_OTHER): Payer: Medicare Other

## 2016-02-18 DIAGNOSIS — I10 Essential (primary) hypertension: Secondary | ICD-10-CM

## 2016-02-18 DIAGNOSIS — R739 Hyperglycemia, unspecified: Secondary | ICD-10-CM

## 2016-02-18 DIAGNOSIS — E781 Pure hyperglyceridemia: Secondary | ICD-10-CM | POA: Diagnosis not present

## 2016-02-18 LAB — LIPID PANEL
CHOLESTEROL: 191 mg/dL (ref 0–200)
HDL: 40.1 mg/dL (ref >39.00)
LDL CALC: 133 mg/dL — AB (ref 0–99)
NonHDL: 151.26
TRIGLYCERIDES: 93 mg/dL (ref 0.0–149.0)
Total CHOL/HDL Ratio: 5
VLDL: 18.6 mg/dL (ref 0.0–40.0)

## 2016-02-18 LAB — COMPREHENSIVE METABOLIC PANEL
ALT: 11 U/L (ref 0–53)
AST: 18 U/L (ref 0–37)
Albumin: 4.2 g/dL (ref 3.5–5.2)
Alkaline Phosphatase: 50 U/L (ref 39–117)
BILIRUBIN TOTAL: 0.4 mg/dL (ref 0.2–1.2)
BUN: 17 mg/dL (ref 6–23)
CALCIUM: 9.1 mg/dL (ref 8.4–10.5)
CHLORIDE: 103 meq/L (ref 96–112)
CO2: 28 meq/L (ref 19–32)
Creatinine, Ser: 1.1 mg/dL (ref 0.40–1.50)
GFR: 86.1 mL/min (ref >60.00)
Glucose, Bld: 110 mg/dL — ABNORMAL HIGH (ref 70–99)
Potassium: 4 mEq/L (ref 3.5–5.1)
Sodium: 137 mEq/L (ref 135–145)
Total Protein: 7.1 g/dL (ref 6.0–8.3)

## 2016-02-18 LAB — HEMOGLOBIN A1C: Hgb A1c MFr Bld: 6.3 % (ref 4.6–6.5)

## 2016-02-25 ENCOUNTER — Encounter: Payer: Self-pay | Admitting: Family Medicine

## 2016-02-25 ENCOUNTER — Ambulatory Visit (INDEPENDENT_AMBULATORY_CARE_PROVIDER_SITE_OTHER): Payer: Medicare Other | Admitting: Family Medicine

## 2016-02-25 VITALS — BP 124/82 | HR 72 | Temp 97.9°F | Ht 67.5 in | Wt 217.5 lb

## 2016-02-25 DIAGNOSIS — Z23 Encounter for immunization: Secondary | ICD-10-CM

## 2016-02-25 DIAGNOSIS — I10 Essential (primary) hypertension: Secondary | ICD-10-CM

## 2016-02-25 DIAGNOSIS — E781 Pure hyperglyceridemia: Secondary | ICD-10-CM

## 2016-02-25 DIAGNOSIS — E6609 Other obesity due to excess calories: Secondary | ICD-10-CM | POA: Diagnosis not present

## 2016-02-25 DIAGNOSIS — Z6833 Body mass index (BMI) 33.0-33.9, adult: Secondary | ICD-10-CM

## 2016-02-25 DIAGNOSIS — R739 Hyperglycemia, unspecified: Secondary | ICD-10-CM

## 2016-02-25 NOTE — Progress Notes (Signed)
Pre visit review using our clinic review tool, if applicable. No additional management support is needed unless otherwise documented below in the visit note. 

## 2016-02-25 NOTE — Progress Notes (Signed)
Subjective:    Patient ID: Daniel Bailey, male    DOB: [birth year], 65 y.o.   MRN: [id number]  HPI Here for f/u of chronic medical problems   Doing well overall  Family notes some issues with hygiene  Did recently buy some wipes   Has only several teeth left - just in the back on the L Does not brush them  Family states he does not drink enough water    Wt Readings from Last 3 Encounters:  02/25/16 217 lb 8 oz (98.7 kg)  08/25/15 207 lb (93.9 kg)  02/22/15 206 lb 12 oz (93.8 kg)  he has been eating "too well" -for a while he was eating all day (caregiver is cooking)  Too many crackers Not a lot of crackers  Perhaps bigger servings  Started walking with another aunt 2 weeks ago -up to a mild per day  bmi is 33.5  bp is stable today  No cp or palpitations or headaches or edema  No side effects to medicines  BP Readings from Last 3 Encounters:  02/25/16 124/82  08/25/15 126/86  02/22/15 118/70      Flu shot - got that today  Flu    Hx of hyperglycemia Lab Results  Component Value Date   HGBA1C 6.3 02/18/2016   This is unchanged from last visit   Hx of hypertriglyceridemia On gemfibrozil bid   Lab Results  Component Value Date   CHOL 191 02/18/2016   CHOL 184 08/18/2015   CHOL 170 02/15/2015   Lab Results  Component Value Date   HDL 40.10 02/18/2016   HDL 43.10 08/18/2015   HDL 38.80 (L) 02/15/2015   Lab Results  Component Value Date   LDLCALC 133 (H) 02/18/2016   LDLCALC 122 (H) 08/18/2015   LDLCALC 111 (H) 02/15/2015   Lab Results  Component Value Date   TRIG 93.0 02/18/2016   TRIG 96.0 08/18/2015   TRIG 99.0 02/15/2015   Lab Results  Component Value Date   CHOLHDL 5 02/18/2016   CHOLHDL 4 08/18/2015   CHOLHDL 4 02/15/2015   Lab Results  Component Value Date   LDLDIRECT 175.3 08/16/2009    Patient Active Problem List   Diagnosis Date Noted  . Hypertriglyceridemia, essential 02/19/2014  . Encounter for Medicare annual wellness exam  08/27/2012  . Obesity 08/18/2011  . Colon cancer screening 08/18/2011  . Routine general medical examination at a health care facility 08/10/2011  . Prostate cancer screening 08/10/2011  . Hyperglycemia 05/30/2010  . HYPERTENSION, BENIGN ESSENTIAL 12/10/2006  . Hypertriglyceridemia 11/23/2006  . MENTAL RETARDATION 11/23/2006   Past Medical History:  Diagnosis Date  . Glaucoma   . Hyperglycemia   . Hyperlipidemia   . Hypertension    No past surgical history on file. Social History  Substance Use Topics  . Smoking status: Never Smoker  . Smokeless tobacco: Never Used  . Alcohol use No   Family History  Problem Relation Age of Onset  . Colon cancer Mother    No Known Allergies Current Outpatient Prescriptions on File Prior to Visit  Medication Sig Dispense Refill  . gemfibrozil (LOPID) 600 MG tablet TAKE 1 TABLET BY MOUTH TWICE A DAY 180 tablet 1  . lisinopril (PRINIVIL,ZESTRIL) 5 MG tablet TAKE 1/2 TABLET BY MOUTH EVERY DAY 45 tablet 1   No current facility-administered medications on file prior to visit.     Review of Systems Review of Systems  Constitutional: Negative for fever, appetite change, fatigue and unexpected  weight change.  Eyes: Negative for pain and visual disturbance.  ENT neg for dental pain  Respiratory: Negative for cough and shortness of breath.   Cardiovascular: Negative for cp or palpitations    Gastrointestinal: Negative for nausea, diarrhea and constipation.  Genitourinary: Negative for urgency and frequency.  Skin: Negative for pallor or rash  pos for occ anal irritation from inability to wipe well  Neurological: Negative for weakness, light-headedness, numbness and headaches.  Hematological: Negative for adenopathy. Does not bruise/bleed easily.  Psychiatric/Behavioral: Negative for dysphoric mood. The patient is not nervous/anxious.         Objective:   Physical Exam  Constitutional: He appears well-developed and well-nourished. No  distress.  HENT:  Head: Normocephalic and atraumatic.  Mouth/Throat: Oropharynx is clear and moist.  2 posterior teeth -lower L -fair condition   Eyes: Conjunctivae and EOM are normal. Pupils are equal, round, and reactive to light. No scleral icterus.  Neck: Normal range of motion. Neck supple. No JVD present. Carotid bruit is not present. No thyromegaly present.  Cardiovascular: Normal rate, regular rhythm, normal heart sounds and intact distal pulses.  Exam reveals no gallop.   Pulmonary/Chest: Effort normal. No respiratory distress. He has wheezes. He has no rales.  No crackles  Abdominal: Soft. Bowel sounds are normal. He exhibits no distension, no abdominal bruit and no mass. There is no tenderness.  Umbilical hernia unchanged  Musculoskeletal: He exhibits no edema.  Pt is wearing knee high support socks, no edema   Lymphadenopathy:    He has no cervical adenopathy.  Neurological: He is alert. He has normal reflexes. No cranial nerve deficit. Coordination normal.  Skin: Skin is warm and dry. No rash noted. No pallor.  Psychiatric: He has a normal mood and affect.  Baseline MR Pleasant and answers questions appropriately with help of 2 aunts present           Assessment & Plan:   Problem List Items Addressed This Visit      Cardiovascular and Mediastinum   HYPERTENSION, BENIGN ESSENTIAL    bp in fair control at this time  BP Readings from Last 1 Encounters:  02/25/16 124/82   No changes needed Disc lifstyle change with low sodium diet and exercise  Enc him to keep walking for exercise         Other   Hyperglycemia    Lab Results  Component Value Date   HGBA1C 6.3 02/18/2016   This is stable Disc imp of low glycemic diet/exercise/wt loss with pt and caregivers to prevent DM      Hypertriglyceridemia, essential    Controlled by gemfibrozil, but noted the LDL is going up  Disc goals for lipids and reasons to control them Rev labs with pt Rev low sat fat  diet in detail Will work on controlling trans/sat fats in diet       Obesity    Discussed how this problem influences overall health and the risks it imposes  Reviewed plan for weight loss with lower calorie diet (via better food choices and also portion control or program like weight watchers) and exercise building up to or more than 30 minutes 5 days per week including some aerobic activity   Disc w/family -his portions have been too big -also needs to cut back on carbs (has done better with sweets and sugar) Commended the walking so far        Other Visit Diagnoses    Need for influenza vaccination    -  Primary   Relevant Orders   Flu Vaccine QUAD 36+ mos IM (Completed)

## 2016-02-25 NOTE — Patient Instructions (Addendum)
For blood sugar- limit portions of carbohydrates and sugars  Increase water intake  For cholesterol  (Avoid red meat/ fried foods/ egg yolks/ fatty breakfast meats/ butter, cheese and high fat dairy/ and shellfish )  Flu shot today  Keep walking! -that is very good for your health  Follow up in 6 moths for annual exam

## 2016-02-27 NOTE — Assessment & Plan Note (Signed)
Lab Results  Component Value Date   HGBA1C 6.3 02/18/2016   This is stable Disc imp of low glycemic diet/exercise/wt loss with pt and caregivers to prevent DM

## 2016-02-27 NOTE — Assessment & Plan Note (Signed)
Discussed how this problem influences overall health and the risks it imposes  Reviewed plan for weight loss with lower calorie diet (via better food choices and also portion control or program like weight watchers) and exercise building up to or more than 30 minutes 5 days per week including some aerobic activity   Disc w/family -his portions have been too big -also needs to cut back on carbs (has done better with sweets and sugar) Commended the walking so far

## 2016-02-27 NOTE — Assessment & Plan Note (Signed)
bp in fair control at this time  BP Readings from Last 1 Encounters:  02/25/16 124/82   No changes needed Disc lifstyle change with low sodium diet and exercise  Enc him to keep walking for exercise

## 2016-02-27 NOTE — Assessment & Plan Note (Signed)
Controlled by gemfibrozil, but noted the LDL is going up  Disc goals for lipids and reasons to control them Rev labs with pt Rev low sat fat diet in detail Will work on controlling trans/sat fats in diet

## 2016-04-16 ENCOUNTER — Other Ambulatory Visit: Payer: Self-pay | Admitting: Family Medicine

## 2016-05-31 DIAGNOSIS — B351 Tinea unguium: Secondary | ICD-10-CM | POA: Diagnosis not present

## 2016-05-31 DIAGNOSIS — M79674 Pain in right toe(s): Secondary | ICD-10-CM | POA: Diagnosis not present

## 2016-05-31 DIAGNOSIS — M79675 Pain in left toe(s): Secondary | ICD-10-CM | POA: Diagnosis not present

## 2016-08-18 ENCOUNTER — Ambulatory Visit: Payer: Medicare Other

## 2016-08-25 ENCOUNTER — Ambulatory Visit (INDEPENDENT_AMBULATORY_CARE_PROVIDER_SITE_OTHER): Payer: Medicare Other

## 2016-08-25 ENCOUNTER — Encounter: Payer: Medicare Other | Admitting: Family Medicine

## 2016-08-25 VITALS — BP 118/82 | HR 66 | Temp 98.0°F | Ht 66.5 in | Wt 216.8 lb

## 2016-08-25 DIAGNOSIS — Z125 Encounter for screening for malignant neoplasm of prostate: Secondary | ICD-10-CM

## 2016-08-25 DIAGNOSIS — Z23 Encounter for immunization: Secondary | ICD-10-CM

## 2016-08-25 DIAGNOSIS — I10 Essential (primary) hypertension: Secondary | ICD-10-CM | POA: Diagnosis not present

## 2016-08-25 DIAGNOSIS — Z0000 Encounter for general adult medical examination without abnormal findings: Secondary | ICD-10-CM | POA: Diagnosis not present

## 2016-08-25 DIAGNOSIS — Z1159 Encounter for screening for other viral diseases: Secondary | ICD-10-CM | POA: Diagnosis not present

## 2016-08-25 DIAGNOSIS — E781 Pure hyperglyceridemia: Secondary | ICD-10-CM

## 2016-08-25 DIAGNOSIS — R739 Hyperglycemia, unspecified: Secondary | ICD-10-CM | POA: Diagnosis not present

## 2016-08-25 LAB — COMPREHENSIVE METABOLIC PANEL
ALBUMIN: 4.4 g/dL (ref 3.5–5.2)
ALT: 12 U/L (ref 0–53)
AST: 20 U/L (ref 0–37)
Alkaline Phosphatase: 60 U/L (ref 39–117)
BUN: 15 mg/dL (ref 6–23)
CHLORIDE: 102 meq/L (ref 96–112)
CO2: 25 meq/L (ref 19–32)
CREATININE: 1.14 mg/dL (ref 0.40–1.50)
Calcium: 9.5 mg/dL (ref 8.4–10.5)
GFR: 82.49 mL/min (ref >60.00)
GLUCOSE: 104 mg/dL — AB (ref 70–99)
POTASSIUM: 4.5 meq/L (ref 3.5–5.1)
SODIUM: 137 meq/L (ref 135–145)
Total Bilirubin: 0.4 mg/dL (ref 0.2–1.2)
Total Protein: 7.8 g/dL (ref 6.0–8.3)

## 2016-08-25 LAB — CBC WITH DIFFERENTIAL/PLATELET
BASOS PCT: 0.5 % (ref 0.0–3.0)
Basophils Absolute: 0 10*3/uL (ref 0.0–0.1)
EOS ABS: 0.1 10*3/uL (ref 0.0–0.7)
EOS PCT: 1.8 % (ref 0.0–5.0)
HCT: 41.3 % (ref 39.0–52.0)
HEMOGLOBIN: 13.9 g/dL (ref 13.0–17.0)
Lymphocytes Relative: 43.1 % (ref 12.0–46.0)
Lymphs Abs: 1.3 10*3/uL (ref 0.7–4.0)
MCHC: 33.6 g/dL (ref 30.0–36.0)
MCV: 89.2 fl (ref 78.0–100.0)
MONO ABS: 0.3 10*3/uL (ref 0.1–1.0)
Monocytes Relative: 9.7 % (ref 3.0–12.0)
Neutro Abs: 1.3 10*3/uL — ABNORMAL LOW (ref 1.4–7.7)
Neutrophils Relative %: 44.9 % (ref 43.0–77.0)
PLATELETS: 319 10*3/uL (ref 150.0–400.0)
RBC: 4.64 Mil/uL (ref 4.22–5.81)
RDW: 14 % (ref 11.5–15.5)
WBC: 2.9 10*3/uL — AB (ref 4.0–10.5)

## 2016-08-25 LAB — LIPID PANEL
CHOL/HDL RATIO: 4
CHOLESTEROL: 195 mg/dL (ref 0–200)
HDL: 46.2 mg/dL (ref >39.00)
LDL CALC: 128 mg/dL — AB (ref 0–99)
NONHDL: 149.03
Triglycerides: 105 mg/dL (ref 0.0–149.0)
VLDL: 21 mg/dL (ref 0.0–40.0)

## 2016-08-25 LAB — HEMOGLOBIN A1C: Hgb A1c MFr Bld: 6.5 % (ref 4.6–6.5)

## 2016-08-25 LAB — PSA, MEDICARE: PSA: 1.11 ng/mL (ref 0.10–4.00)

## 2016-08-25 LAB — TSH: TSH: 2.7 u[IU]/mL (ref 0.35–4.50)

## 2016-08-25 NOTE — Progress Notes (Signed)
PCP notes:   Health maintenance:  Hep C screening - completed PPSV23 - completed  Abnormal screenings:   Hearing - failed  Patient concerns:   Pt had complaint of chest pain approx. 2 days ago. Pt stated chest pain was in middle of chest. Pt denied any pain or discomfort in left arm. Pt was advised by caregiver to share with her if there were any future concerns of chest pain.   Pt's caregiver, [name], desires a CNA to assist with pt with baths. She states pt must be monitored during bathing.   Nurse concerns:  None  I reviewed health advisor's note, was available for consultation, and agree with documentation and plan. Loura Pardon MD   Next PCP appt:   09/01/16 @ 1515

## 2016-08-25 NOTE — Progress Notes (Signed)
Pre visit review using our clinic review tool, if applicable. No additional management support is needed unless otherwise documented below in the visit note. 

## 2016-08-25 NOTE — Progress Notes (Signed)
Subjective:   Daniel Bailey is a 66 y.o. male who presents for Medicare Annual/Subsequent preventive examination.  Review of Systems:  N/A Cardiac Risk Factors include: advanced age (>41men, >53 women);male gender;hypertension;dyslipidemia;obesity (BMI >30kg/m2)     Objective:    Vitals: BP 118/82 (BP Location: Right Arm, Patient Position: Sitting, Cuff Size: Normal)   Pulse 66   Temp 98 F (36.7 C) (Oral)   Ht 5' 6.5" (1.689 m) Comment: no shoes  Wt 216 lb 12 oz (98.3 kg)   SpO2 98%   BMI 34.46 kg/m   Body mass index is 34.46 kg/m.  Tobacco History  Smoking Status  . Never Smoker  Smokeless Tobacco  . Never Used     Counseling given: No   Past Medical History:  Diagnosis Date  . Glaucoma   . Hyperglycemia   . Hyperlipidemia   . Hypertension    History reviewed. No pertinent surgical history. Family History  Problem Relation Age of Onset  . Colon cancer Mother    History  Sexual Activity  . Sexual activity: No    Outpatient Encounter Prescriptions as of 08/25/2016  Medication Sig  . gemfibrozil (LOPID) 600 MG tablet TAKE 1 TABLET BY MOUTH TWICE A DAY  . lisinopril (PRINIVIL,ZESTRIL) 5 MG tablet TAKE 1/2 TABLET BY MOUTH EVERY DAY   No facility-administered encounter medications on file as of 08/25/2016.     Activities of Daily Living In your present state of health, do you have any difficulty performing the following activities: 08/25/2016  Hearing? N  Vision? N  Difficulty concentrating or making decisions? Y  Walking or climbing stairs? N  Dressing or bathing? Y  Doing errands, shopping? Y  Preparing Food and eating ? Y  Using the Toilet? N  In the past six months, have you accidently leaked urine? N  Do you have problems with loss of bowel control? N  Managing your Medications? Y  Managing your Finances? Y  Housekeeping or managing your Housekeeping? Y  Some recent data might be hidden    Patient Care Team: [name], MD as PCP - General    Assessment:     Hearing Screening   125Hz  250Hz  500Hz  1000Hz  2000Hz  3000Hz  4000Hz  6000Hz  8000Hz   Right ear:   0 40 40  0    Left ear:   40 40 40  0    Vision Screening Comments: Pt was unable to recognize most of the symbols on eye chart. Pt was able to recognize the &quot;o&quot; on chart. Pt was able to clearly see the &quot;o&quot; at 20/30 with both eye and with each single eye.    Exercise Activities and Dietary recommendations Current Exercise Habits: The patient does not participate in regular exercise at present, Exercise limited by: None identified  Goals    . Increase physical activity          Starting 08/26/2016, I will attempt to do at least 15 min of chair exercises daily.       Fall Risk Fall Risk  08/25/2016 08/25/2015 08/21/2014 08/19/2013  Falls in the past year? No No No No   Depression Screen PHQ 2/9 Scores 08/25/2016 08/25/2015 08/21/2014 08/19/2013  PHQ - 2 Score 0 0 0 0    Cognitive Function MMSE - Mini Mental State Exam 08/25/2016  Not completed: Unable to complete  DX: mental retardation      Immunization History  Administered Date(s) Administered  . Influenza Split 02/19/2012  . Influenza,inj,Quad PF,36+ Mos 02/28/2013, 02/18/2014,  02/22/2015, 02/25/2016  . Pneumococcal Conjugate-13 08/25/2015  . Pneumococcal Polysaccharide-23 08/25/2016  . Td 02/03/1997, 12/10/2006, 11/23/2009   Screening Tests Health Maintenance  Topic Date Due  . INFLUENZA VACCINE  12/06/2016  . TETANUS/TDAP  11/24/2019  . COLONOSCOPY  04/10/2022  . Hepatitis C Screening  Completed  . PNA vac Low Risk Adult  Completed      Plan:     I have personally reviewed and addressed the Medicare Annual Wellness questionnaire and have noted the following in the patient's chart:  A. Medical and social history B. Use of alcohol, tobacco or illicit drugs  C. Current medications and supplements D. Functional ability and status E.  Nutritional status F.  Physical activity G. Advance  directives H. List of other physicians I.  Hospitalizations, surgeries, and ER visits in previous 12 months J.  [place] to include hearing, vision, cognitive, depression L. Referrals and appointments - none  In addition, I have reviewed and discussed with patient certain preventive protocols, quality metrics, and best practice recommendations. A written personalized care plan for preventive services as well as general preventive health recommendations were provided to patient.  See attached scanned questionnaire for additional information.   Signed,   Lindell Noe, MHA, BS, LPN Health Coach

## 2016-08-25 NOTE — Patient Instructions (Signed)
[name] , Thank you for taking time to come for your Medicare Wellness Visit. I appreciate your ongoing commitment to your health goals. Please review the following plan we discussed and let me know if I can assist you in the future.   These are the goals we discussed: Goals    . Increase physical activity          Starting 08/26/2016, I will attempt to do at least 15 min of chair exercises daily.        This is a list of the screening recommended for you and due dates:  Health Maintenance  Topic Date Due  . Flu Shot  12/06/2016  . Tetanus Vaccine  11/24/2019  . Colon Cancer Screening  04/10/2022  .  Hepatitis C: One time screening is recommended by Center for Disease Control  (CDC) for  adults born from [birth year] through 1966.   Completed  . Pneumonia vaccines  Completed   Preventive Care for Adults  A healthy lifestyle and preventive care can promote health and wellness. Preventive health guidelines for adults include the following key practices.  . A routine yearly physical is a good way to check with your health care provider about your health and preventive screening. It is a chance to share any concerns and updates on your health and to receive a thorough exam.  . Visit your dentist for a routine exam and preventive care every 6 months. Brush your teeth twice a day and floss once a day. Good oral hygiene prevents tooth decay and gum disease.  . The frequency of eye exams is based on your age, health, family medical history, use  of contact lenses, and other factors. Follow your health care provider's ecommendations for frequency of eye exams.  . Eat a healthy diet. Foods like vegetables, fruits, whole grains, low-fat dairy products, and lean protein foods contain the nutrients you need without too many calories. Decrease your intake of foods high in solid fats, added sugars, and salt. Eat the right amount of calories for you. Get information about a proper diet from your health care  provider, if necessary.  . Regular physical exercise is one of the most important things you can do for your health. Most adults should get at least 150 minutes of moderate-intensity exercise (any activity that increases your heart rate and causes you to sweat) each week. In addition, most adults need muscle-strengthening exercises on 2 or more days a week.  Silver Sneakers may be a benefit available to you. To determine eligibility, you may visit the website: www.silversneakers.com or contact program at [phone] Mon-Fri between 8AM-8PM.   . Maintain a healthy weight. The body mass index (BMI) is a screening tool to identify possible weight problems. It provides an estimate of body fat based on height and weight. Your health care provider can find your BMI and can help you achieve or maintain a healthy weight.   For adults 20 years and older: ? A BMI below 18.5 is considered underweight. ? A BMI of 18.5 to 24.9 is normal. ? A BMI of 25 to 29.9 is considered overweight. ? A BMI of 30 and above is considered obese.   . Maintain normal blood lipids and cholesterol levels by exercising and minimizing your intake of saturated fat. Eat a balanced diet with plenty of fruit and vegetables. Blood tests for lipids and cholesterol should begin at age 25 and be repeated every 5 years. If your lipid or cholesterol levels are high,  you are over 50, or you are at high risk for heart disease, you may need your cholesterol levels checked more frequently. Ongoing high lipid and cholesterol levels should be treated with medicines if diet and exercise are not working.  . If you smoke, find out from your health care provider how to quit. If you do not use tobacco, please do not start.  . If you choose to drink alcohol, please do not consume more than 2 drinks per day. One drink is considered to be 12 ounces (355 mL) of beer, 5 ounces (148 mL) of wine, or 1.5 ounces (44 mL) of liquor.  . If you are 34-79 years  old, ask your health care provider if you should take aspirin to prevent strokes.  . Use sunscreen. Apply sunscreen liberally and repeatedly throughout the day. You should seek shade when your shadow is shorter than you. Protect yourself by wearing long sleeves, pants, a wide-brimmed hat, and sunglasses year round, whenever you are outdoors.  . Once a month, do a whole body skin exam, using a mirror to look at the skin on your back. Tell your health care provider of new moles, moles that have irregular borders, moles that are larger than a pencil eraser, or moles that have changed in shape or color.

## 2016-08-29 LAB — HEPATITIS C ANTIBODY: HCV AB: NEGATIVE

## 2016-09-01 ENCOUNTER — Encounter: Payer: Self-pay | Admitting: Family Medicine

## 2016-09-01 ENCOUNTER — Ambulatory Visit (INDEPENDENT_AMBULATORY_CARE_PROVIDER_SITE_OTHER): Payer: Medicare Other | Admitting: Family Medicine

## 2016-09-01 VITALS — BP 124/84 | HR 69 | Temp 98.1°F | Ht 66.5 in | Wt 218.0 lb

## 2016-09-01 DIAGNOSIS — E781 Pure hyperglyceridemia: Secondary | ICD-10-CM

## 2016-09-01 DIAGNOSIS — E6609 Other obesity due to excess calories: Secondary | ICD-10-CM

## 2016-09-01 DIAGNOSIS — Z6834 Body mass index (BMI) 34.0-34.9, adult: Secondary | ICD-10-CM | POA: Diagnosis not present

## 2016-09-01 DIAGNOSIS — D72819 Decreased white blood cell count, unspecified: Secondary | ICD-10-CM | POA: Insufficient documentation

## 2016-09-01 DIAGNOSIS — E66811 Obesity, class 1: Secondary | ICD-10-CM

## 2016-09-01 DIAGNOSIS — I10 Essential (primary) hypertension: Secondary | ICD-10-CM

## 2016-09-01 DIAGNOSIS — Z125 Encounter for screening for malignant neoplasm of prostate: Secondary | ICD-10-CM | POA: Diagnosis not present

## 2016-09-01 DIAGNOSIS — R739 Hyperglycemia, unspecified: Secondary | ICD-10-CM

## 2016-09-01 DIAGNOSIS — F79 Unspecified intellectual disabilities: Secondary | ICD-10-CM | POA: Diagnosis not present

## 2016-09-01 DIAGNOSIS — D709 Neutropenia, unspecified: Secondary | ICD-10-CM

## 2016-09-01 NOTE — Progress Notes (Signed)
Pre visit review using our clinic review tool, if applicable. No additional management support is needed unless otherwise documented below in the visit note. 

## 2016-09-01 NOTE — Patient Instructions (Addendum)
Get back to walking - 30 minutes per day at least-around house and property and driveway (safe areas)   Colonoscopy will be due in December - let us know if you need help scheduling that   Shingrix is the name of the new shingles immunization  It is a set of 2 shots  If you are interested in a shingles/zoster vaccine - call your insurance to check on coverage,( you should not get it within 1 month of other vaccines) , then call us for a prescription  for it to take to a pharmacy that gives the shot , or make a nurse visit to get it here depending on your coverage    In terms of home care- check with your medicare plan regarding help with bathing (activities of daily living) - check with your medicare provider

## 2016-09-01 NOTE — Progress Notes (Signed)
Subjective:    Patient ID: Daniel Bailey, male    DOB: 1950-06-17, 66 y.o.   MRN: [id number]  HPI Here for annual f/u of chronic health problems   Doing ok  Sleeping and eating  He was exercising more for a while when his aunt was visiting- and then she had to leave in Jan  Did not continue  He is safe to walk the driveway  Helps his mother at home   Wt Readings from Last 3 Encounters:  09/01/16 218 lb (98.9 kg)  08/25/16 216 lb 12 oz (98.3 kg)  02/25/16 217 lb 8 oz (98.7 kg)  wt is up 2 lb  Eats cereal in am  Eating very well / fruits and veg and lean protein (now that aunt buys groceries)- more healthy stuff Avoiding sugar sweets / occ sugar free sweets  He likes sweets - getting used to less sweets   bmi 34.6  Had amw on 4/20 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= not c/o of hearing problems and does not want hearing aide Family dose not notice  Had PPSV23 Hep c screening neg   Colonoscopy 12/13- hyperplastic polyp  5 y recall due to family hx   Zoster imm-would consider if covered   Prostate screening  No problems with frequency or urgency or flow  No nocturia  Lab Results  Component Value Date   PSA 1.11 08/25/2016   PSA 0.95 08/18/2015   PSA 0.79 08/14/2014    Family desires some help with CNA to help with bathing  bp is stable today  No cp or palpitations or headaches or edema  No side effects to medicines  BP Readings from Last 3 Encounters:  09/01/16 124/84  08/25/16 118/82  02/25/16 124/82     Hx of hypertriglyceridemia  Lab Results  Component Value Date   CHOL 195 08/25/2016   CHOL 191 02/18/2016   CHOL 184 08/18/2015   Lab Results  Component Value Date   HDL 46.20 08/25/2016   HDL 40.10 02/18/2016   HDL 43.10 08/18/2015   Lab Results  Component Value Date   LDLCALC 128 (H) 08/25/2016   LDLCALC 133 (H) 02/18/2016   LDLCALC 122 (H) 08/18/2015   Lab Results  Component Value Date   TRIG 105.0 08/25/2016   TRIG 93.0 02/18/2016   TRIG 96.0 08/18/2015   Lab Results  Component Value Date   CHOLHDL 4 08/25/2016   CHOLHDL 5 02/18/2016   CHOLHDL 4 08/18/2015   Lab Results  Component Value Date   LDLDIRECT 175.3 08/16/2009   On gemfibrozil and diet  Overall stable  Not much fatty foods    Hx of hyperglycemia Lab Results  Component Value Date   HGBA1C 6.5 08/25/2016   This is up from 6.3 and borderline for diabetes   Results for orders placed or performed in visit on 08/25/16  PSA, Medicare  Result Value Ref Range   PSA 1.11 0.10 - 4.00 ng/ml  Hepatitis C antibody  Result Value Ref Range   HCV Ab NEGATIVE NEGATIVE  Hemoglobin A1c  Result Value Ref Range   Hgb A1c MFr Bld 6.5 4.6 - 6.5 %  Lipid Panel  Result Value Ref Range   Cholesterol 195 0 - 200 mg/dL   Triglycerides 105.0 0.0 - 149.0 mg/dL   HDL 46.20 >39.00 mg/dL   VLDL 21.0 0.0 - 40.0 mg/dL   LDL Cholesterol 128 (H) 0 - 99 mg/dL   Total CHOL/HDL Ratio 4  NonHDL 149.03   Comprehensive metabolic panel  Result Value Ref Range   Sodium 137 135 - 145 mEq/L   Potassium 4.5 3.5 - 5.1 mEq/L   Chloride 102 96 - 112 mEq/L   CO2 25 19 - 32 mEq/L   Glucose, Bld 104 (H) 70 - 99 mg/dL   BUN 15 6 - 23 mg/dL   Creatinine, Ser 1.14 0.40 - 1.50 mg/dL   Total Bilirubin 0.4 0.2 - 1.2 mg/dL   Alkaline Phosphatase 60 39 - 117 U/L   AST 20 0 - 37 U/L   ALT 12 0 - 53 U/L   Total Protein 7.8 6.0 - 8.3 g/dL   Albumin 4.4 3.5 - 5.2 g/dL   Calcium 9.5 8.4 - 10.5 mg/dL   GFR 82.49 >60.00 mL/min  CBC with Differential/Platelet  Result Value Ref Range   WBC 2.9 (L) 4.0 - 10.5 K/uL   RBC 4.64 4.22 - 5.81 Mil/uL   Hemoglobin 13.9 13.0 - 17.0 g/dL   HCT 41.3 39.0 - 52.0 %   MCV 89.2 78.0 - 100.0 fl   MCHC 33.6 30.0 - 36.0 g/dL   RDW 14.0 11.5 - 15.5 %   Platelets 319.0 150.0 - 400.0 K/uL   Neutrophils Relative % 44.9 43.0 - 77.0 %   Lymphocytes Relative 43.1 12.0 - 46.0 %   Monocytes Relative 9.7 3.0 - 12.0 %   Eosinophils Relative 1.8 0.0 - 5.0 %    Basophils Relative 0.5 0.0 - 3.0 %   Neutro Abs 1.3 (L) 1.4 - 7.7 K/uL   Lymphs Abs 1.3 0.7 - 4.0 K/uL   Monocytes Absolute 0.3 0.1 - 1.0 K/uL   Eosinophils Absolute 0.1 0.0 - 0.7 K/uL   Basophils Absolute 0.0 0.0 - 0.1 K/uL  TSH  Result Value Ref Range   TSH 2.70 0.35 - 4.50 uIU/mL     Patient Active Problem List   Diagnosis Date Noted  . 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 Hypertriglyceridemia, essential 02/19/2014  . Encounter for Medicare annual wellness exam 08/27/2012  . Obesity 08/18/2011  . Colon cancer screening 08/18/2011  . Routine general medical examination at a health care facility 08/10/2011  . Prostate cancer screening 08/10/2011  . Hyperglycemia 05/30/2010  . HYPERTENSION, BENIGN ESSENTIAL 12/10/2006  . MENTAL RETARDATION 11/23/2006   Past Medical History:  Diagnosis Date  . Glaucoma   . Hyperglycemia   . Hyperlipidemia   . Hypertension    History reviewed. No pertinent surgical history. Social History  Substance Use Topics  . Smoking status: Never Smoker  . Smokeless tobacco: Never Used  . Alcohol use No   Family History  Problem Relation Age of Onset  . Colon cancer Mother    No Known Allergies Current Outpatient Prescriptions on File Prior to Visit  Medication Sig Dispense Refill  . gemfibrozil (LOPID) 600 MG tablet TAKE 1 TABLET BY MOUTH TWICE A DAY 180 tablet 1  . lisinopril (PRINIVIL,ZESTRIL) 5 MG tablet TAKE 1/2 TABLET BY MOUTH EVERY DAY 45 tablet 1   No current facility-administered medications on file prior to visit.     Review of Systems Review of Systems  Constitutional: Negative for fever, appetite change, fatigue and unexpected weight change.  Eyes: Negative for pain and visual disturbance.  ENT neg for congestion or rhinorrhea  Respiratory: Negative for cough and shortness of breath.   Cardiovascular: Negative for cp or palpitations    Gastrointestinal: Negative for nausea, diarrhea and constipation.  Genitourinary: Negative for  urgency and frequency.  Skin: Negative for pallor or rash   Neurological: Negative for weakness, light-headedness, numbness and headaches.  Hematological: Negative for adenopathy. Does not bruise/bleed easily.  Psychiatric/Behavioral: Negative for dysphoric mood. The patient is not nervous/anxious.         Objective:   Physical Exam  Constitutional: He appears well-developed and well-nourished. No distress.  overwt and well appearing  Baseline MR   HENT:  Head: Normocephalic and atraumatic.  Right Ear: External ear normal.  Left Ear: External ear normal.  Nose: Nose normal.  Mouth/Throat: Oropharynx is clear and moist.  Eyes: Conjunctivae and EOM are normal. Pupils are equal, round, and reactive to light. Right eye exhibits no discharge. Left eye exhibits no discharge. No scleral icterus.  Neck: Normal range of motion. Neck supple. No JVD present. Carotid bruit is not present. No thyromegaly present.  Cardiovascular: Normal rate, regular rhythm, normal heart sounds and intact distal pulses.  Exam reveals no gallop.   Pulmonary/Chest: Effort normal and breath sounds normal. No respiratory distress. He has no wheezes. He exhibits no tenderness.  Abdominal: Soft. Bowel sounds are normal. He exhibits no distension, no abdominal bruit and no mass. There is no tenderness.  Musculoskeletal: He exhibits no edema or tenderness.  Lymphadenopathy:    He has no cervical adenopathy.  Neurological: He is alert. He has normal reflexes. No cranial nerve deficit. He exhibits normal muscle tone. Coordination normal.  Skin: Skin is warm and dry. No rash noted. No erythema. No pallor.  Brown nevi and skin tags diffusely  Some sks   Psychiatric: He has a normal mood and affect.  Pleasant Baseline MR  Answers questions appropriately           Assessment & Plan:   Problem List Items Addressed This Visit      Cardiovascular and Mediastinum   HYPERTENSION, BENIGN ESSENTIAL - Primary    bp in  fair control at this time  BP Readings from Last 1 Encounters:  09/01/16 124/84   No changes needed Disc lifstyle change with low sodium diet and exercise  Labs reviewed  Wt loss enc         Other   Hyperglycemia    Lab Results  Component Value Date   HGBA1C 6.5 08/25/2016   This is increased disc imp of low glycemic diet and wt loss to prevent DM2 Family voices understanding Will try to get him back to walking       Hypertriglyceridemia, essential    Fairly well controlled with gemfibrozil  Watching LDL - at 128 Would rather avoid mixing statin if possible  Counseled on diet       Leukocytopenia    Last wbc was 2.9- a little lower than his baseline  We will follow this  No symptoms /viral symptoms       MENTAL RETARDATION    No change  Mother had health issues and cousin helps and fixes meals Need some more help with bathing-mother had mobility impairment  They will check with ins to see if [facility] aide may be covered and let us know if they want a referral       Obesity    Discussed how this problem influences overall health and the risks it imposes  Reviewed plan for weight loss with lower calorie diet (via better food choices and also portion control or program like weight watchers) and exercise building up to or more than 30 minutes 5 days per week including some aerobic activity   Limited  re: needs help getting exercise safely  Family is working together  Cousin is fixing healthier meals       Prostate cancer screening    Lab Results  Component Value Date   PSA 1.11 08/25/2016   PSA 0.95 08/18/2015   PSA 0.79 08/14/2014   No voiding symptoms  No nocturia

## 2016-09-02 ENCOUNTER — Other Ambulatory Visit: Payer: Self-pay | Admitting: Family Medicine

## 2016-09-03 NOTE — Assessment & Plan Note (Signed)
Discussed how this problem influences overall health and the risks it imposes  Reviewed plan for weight loss with lower calorie diet (via better food choices and also portion control or program like weight watchers) and exercise building up to or more than 30 minutes 5 days per week including some aerobic activity   Limited re: needs help getting exercise safely  Family is working together  Cousin is fixing healthier meals

## 2016-09-03 NOTE — Assessment & Plan Note (Signed)
Last wbc was 2.9- a little lower than his baseline  We will follow this  No symptoms /viral symptoms

## 2016-09-03 NOTE — Assessment & Plan Note (Signed)
Fairly well controlled with gemfibrozil  Watching LDL - at 128 Would rather avoid mixing statin if possible  Counseled on diet

## 2016-09-03 NOTE — Assessment & Plan Note (Signed)
Lab Results  Component Value Date   HGBA1C 6.5 08/25/2016   This is increased disc imp of low glycemic diet and wt loss to prevent DM2 Family voices understanding Will try to get him back to walking

## 2016-09-03 NOTE — Assessment & Plan Note (Signed)
Lab Results  Component Value Date   PSA 1.11 08/25/2016   PSA 0.95 08/18/2015   PSA 0.79 08/14/2014   No voiding symptoms  No nocturia

## 2016-09-03 NOTE — Assessment & Plan Note (Signed)
bp in fair control at this time  BP Readings from Last 1 Encounters:  09/01/16 124/84   No changes needed Disc lifstyle change with low sodium diet and exercise  Labs reviewed  Wt loss enc

## 2016-09-03 NOTE — Assessment & Plan Note (Signed)
No change  Mother had health issues and cousin helps and fixes meals Need some more help with bathing-mother had mobility impairment  They will check with ins to see if [facility] aide may be covered and let us know if they want a referral

## 2016-10-26 ENCOUNTER — Telehealth: Payer: Self-pay | Admitting: Family Medicine

## 2016-10-26 NOTE — Telephone Encounter (Signed)
Patient Name: Daniel Bailey  DOB: Mar 24, 1951    Initial Comment Patient has been vomiting and having diarrhea. Has urinated in the last 8 hours.    Nurse Assessment  Nurse: Genoveva Ill, RN, Lattie Haw Date/Time (Eastern Time): 10/26/2016 3:56:30 PM  Confirm and document reason for call. If symptomatic, describe symptoms. ---caller states patient has been vomiting and having diarrhea since last night; states going back and forth to bathroom; mentally challenged; unsure of quantity, but drinking gatorade; mouth wet; urinated last 20 min ago  Does the patient have any new or worsening symptoms? ---Yes  Will a triage be completed? ---Yes  Related visit to physician within the last 2 weeks? ---No  Does the PT have any chronic conditions? (i.e. diabetes, asthma, etc.) ---Yes  List chronic conditions. ---mentally challenged, thyroid disease, sz d/o  Is this a behavioral health or substance abuse call? ---No     Guidelines    Guideline Title Affirmed Question Affirmed Notes  Vomiting [1] MODERATE vomiting (e.g., 3 - 5 times/day) AND [2] age > 63    Final Disposition User   Go to ED Now (or PCP triage) [place], RN, Lattie Haw    Comments  NO APPTS AVAILABLE AT PCP OFFICE OR [facility] OFFICE; REFERRED TO [facility]   Referrals  [facility] [facility] [place]   Disagree/Comply: Comply

## 2016-10-26 NOTE — Telephone Encounter (Signed)
Please check with his mom to see how he is doing on Friday  thanks

## 2016-10-27 NOTE — Telephone Encounter (Signed)
Spoke to [employer] and she advised me pt is okay, they gave him Gatorade and soup for dinner. [name] isn't sure if pt really had N&V and diarrhea because no one in the house saw him do it, he was just telling them he did and given his mental status they are not sure if it's true or not but pt is acting fine and as of now no witnessed vomiting or diarrhea but [name] is there now and she is keeping an eye on him and will update [place] if he develops any sxs

## 2016-11-26 ENCOUNTER — Other Ambulatory Visit: Payer: Self-pay | Admitting: Family Medicine

## 2016-12-07 DIAGNOSIS — M79674 Pain in right toe(s): Secondary | ICD-10-CM | POA: Diagnosis not present

## 2016-12-07 DIAGNOSIS — B351 Tinea unguium: Secondary | ICD-10-CM | POA: Diagnosis not present

## 2016-12-07 DIAGNOSIS — M79675 Pain in left toe(s): Secondary | ICD-10-CM | POA: Diagnosis not present

## 2017-02-18 ENCOUNTER — Telehealth: Payer: Self-pay | Admitting: Family Medicine

## 2017-02-18 DIAGNOSIS — R739 Hyperglycemia, unspecified: Secondary | ICD-10-CM

## 2017-02-18 DIAGNOSIS — I10 Essential (primary) hypertension: Secondary | ICD-10-CM

## 2017-02-18 DIAGNOSIS — D709 Neutropenia, unspecified: Secondary | ICD-10-CM

## 2017-02-18 DIAGNOSIS — E781 Pure hyperglyceridemia: Secondary | ICD-10-CM

## 2017-02-18 NOTE — Telephone Encounter (Signed)
-----   Message from Ellamae Sia sent at 02/14/2017 11:18 AM EDT ----- Regarding: Lab orders for Friday, 10.19.18 Lab orders for a 6 month follow up appt

## 2017-02-23 ENCOUNTER — Other Ambulatory Visit (INDEPENDENT_AMBULATORY_CARE_PROVIDER_SITE_OTHER): Payer: Medicare Other

## 2017-02-23 DIAGNOSIS — D709 Neutropenia, unspecified: Secondary | ICD-10-CM

## 2017-02-23 DIAGNOSIS — I10 Essential (primary) hypertension: Secondary | ICD-10-CM

## 2017-02-23 DIAGNOSIS — E781 Pure hyperglyceridemia: Secondary | ICD-10-CM

## 2017-02-23 DIAGNOSIS — R739 Hyperglycemia, unspecified: Secondary | ICD-10-CM

## 2017-02-23 LAB — CBC WITH DIFFERENTIAL/PLATELET
BASOS PCT: 0.8 % (ref 0.0–3.0)
Basophils Absolute: 0 10*3/uL (ref 0.0–0.1)
EOS ABS: 0.1 10*3/uL (ref 0.0–0.7)
Eosinophils Relative: 2.3 % (ref 0.0–5.0)
HEMATOCRIT: 37.9 % — AB (ref 39.0–52.0)
HEMOGLOBIN: 12.4 g/dL — AB (ref 13.0–17.0)
LYMPHS PCT: 47.5 % — AB (ref 12.0–46.0)
Lymphs Abs: 1.7 10*3/uL (ref 0.7–4.0)
MCHC: 32.6 g/dL (ref 30.0–36.0)
MCV: 91.1 fl (ref 78.0–100.0)
MONOS PCT: 11.2 % (ref 3.0–12.0)
Monocytes Absolute: 0.4 10*3/uL (ref 0.1–1.0)
Neutro Abs: 1.4 10*3/uL (ref 1.4–7.7)
Neutrophils Relative %: 38.2 % — ABNORMAL LOW (ref 43.0–77.0)
Platelets: 374 10*3/uL (ref 150.0–400.0)
RBC: 4.16 Mil/uL — ABNORMAL LOW (ref 4.22–5.81)
RDW: 14.1 % (ref 11.5–15.5)
WBC: 3.6 10*3/uL — AB (ref 4.0–10.5)

## 2017-02-23 LAB — COMPREHENSIVE METABOLIC PANEL
ALBUMIN: 4.2 g/dL (ref 3.5–5.2)
ALT: 33 U/L (ref 0–53)
AST: 29 U/L (ref 0–37)
Alkaline Phosphatase: 76 U/L (ref 39–117)
BUN: 13 mg/dL (ref 6–23)
CHLORIDE: 100 meq/L (ref 96–112)
CO2: 26 meq/L (ref 19–32)
CREATININE: 1.15 mg/dL (ref 0.40–1.50)
Calcium: 9.3 mg/dL (ref 8.4–10.5)
GFR: 81.54 mL/min (ref >60.00)
GLUCOSE: 114 mg/dL — AB (ref 70–99)
Potassium: 4.4 mEq/L (ref 3.5–5.1)
SODIUM: 135 meq/L (ref 135–145)
Total Bilirubin: 0.3 mg/dL (ref 0.2–1.2)
Total Protein: 7.6 g/dL (ref 6.0–8.3)

## 2017-02-23 LAB — LIPID PANEL
CHOL/HDL RATIO: 5
Cholesterol: 187 mg/dL (ref 0–200)
HDL: 38.1 mg/dL — ABNORMAL LOW (ref >39.00)
LDL CALC: 126 mg/dL — AB (ref 0–99)
NONHDL: 149.28
Triglycerides: 114 mg/dL (ref 0.0–149.0)
VLDL: 22.8 mg/dL (ref 0.0–40.0)

## 2017-02-23 LAB — HEMOGLOBIN A1C: Hgb A1c MFr Bld: 6.5 % (ref 4.6–6.5)

## 2017-03-02 ENCOUNTER — Encounter: Payer: Self-pay | Admitting: Family Medicine

## 2017-03-02 ENCOUNTER — Ambulatory Visit (INDEPENDENT_AMBULATORY_CARE_PROVIDER_SITE_OTHER): Payer: Medicare Other | Admitting: Family Medicine

## 2017-03-02 VITALS — BP 128/84 | HR 72 | Temp 98.3°F | Ht 66.5 in | Wt 230.5 lb

## 2017-03-02 DIAGNOSIS — Z6834 Body mass index (BMI) 34.0-34.9, adult: Secondary | ICD-10-CM

## 2017-03-02 DIAGNOSIS — Z23 Encounter for immunization: Secondary | ICD-10-CM

## 2017-03-02 DIAGNOSIS — R739 Hyperglycemia, unspecified: Secondary | ICD-10-CM | POA: Diagnosis not present

## 2017-03-02 DIAGNOSIS — E6609 Other obesity due to excess calories: Secondary | ICD-10-CM

## 2017-03-02 DIAGNOSIS — E781 Pure hyperglyceridemia: Secondary | ICD-10-CM

## 2017-03-02 DIAGNOSIS — I10 Essential (primary) hypertension: Secondary | ICD-10-CM

## 2017-03-02 NOTE — Patient Instructions (Addendum)
Goal is 5 days per week of exercise - working up to 30 minutes or more  [facility]  Walking videos for home are a good idea also  Also house work   Continue watching diet  Try to get most of your carbohydrates from produce (with the exception of white potatoes)  Eat less bread/pasta/rice/snack foods/cereals/sweets and other items from the middle of the grocery store (processed carbs)   We want to prevent an increase in blood sugar  Weight loss is the best strategy   Follow up 6 mo

## 2017-03-02 NOTE — Progress Notes (Signed)
Subjective:    Patient ID: Daniel Bailey, male    DOB: [birth year], 66 y.o.   MRN: [id number]  HPI Here for f/u of chronic medical problems  Doing ok overall  Nothing new  Feeling fine   He sits around and watching TV   Wt Readings from Last 3 Encounters:  03/02/17 230 lb 8 oz (104.6 kg)  09/01/16 218 lb (98.9 kg)  08/25/16 216 lb 12 oz (98.3 kg)  significant wt gain  Not active  Smaller portions  [facility] - is looking forward to that ! Caregiver and family will take him  36.65 kg/m  bp is stable today  No cp or palpitations or headaches or edema  No side effects to medicines  BP Readings from Last 3 Encounters:  03/02/17 128/84  09/01/16 124/84  08/25/16 118/82     Lab Results  Component Value Date   CREATININE 1.15 02/23/2017   BUN 13 02/23/2017   NA 135 02/23/2017   K 4.4 02/23/2017   CL 100 02/23/2017   CO2 26 02/23/2017   Lab Results  Component Value Date   ALT 33 02/23/2017   AST 29 02/23/2017   ALKPHOS 76 02/23/2017   BILITOT 0.3 02/23/2017     Hyperglycemia Lab Results  Component Value Date   HGBA1C 6.5 02/23/2017   This is stable from last check   H/o low wbc Lab Results  Component Value Date   WBC 3.6 (L) 02/23/2017   HGB 12.4 (L) 02/23/2017   HCT 37.9 (L) 02/23/2017   MCV 91.1 02/23/2017   PLT 374.0 02/23/2017   Improved  Hb is down slightly   Cholesterol  On gemfibrozil for high trig  Lab Results  Component Value Date   CHOL 187 02/23/2017   CHOL 195 08/25/2016   CHOL 191 02/18/2016   Lab Results  Component Value Date   HDL 38.10 (L) 02/23/2017   HDL 46.20 08/25/2016   HDL 40.10 02/18/2016   Lab Results  Component Value Date   LDLCALC 126 (H) 02/23/2017   LDLCALC 128 (H) 08/25/2016   LDLCALC 133 (H) 02/18/2016   Lab Results  Component Value Date   TRIG 114.0 02/23/2017   TRIG 105.0 08/25/2016   TRIG 93.0 02/18/2016   Lab Results  Component Value Date   CHOLHDL 5  02/23/2017   CHOLHDL 4 08/25/2016   CHOLHDL 5 02/18/2016   Lab Results  Component Value Date   LDLDIRECT 175.3 08/16/2009   Less active HDL is down  No longer walking daily  Trig well controlled   Patient Active Problem List   Diagnosis Date Noted  . 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 Hypertriglyceridemia, essential 02/19/2014  . Encounter for Medicare annual wellness exam 08/27/2012  . Obesity 08/18/2011  . Colon cancer screening 08/18/2011  . Routine general medical examination at a health care facility 08/10/2011  . Prostate cancer screening 08/10/2011  . Hyperglycemia 05/30/2010  . HYPERTENSION, BENIGN ESSENTIAL 12/10/2006  . MENTAL RETARDATION 11/23/2006   Past Medical History:  Diagnosis Date  . Glaucoma   . Hyperglycemia   . Hyperlipidemia   . Hypertension    No past surgical history on file. Social History  Substance Use Topics  . Smoking status: Never Smoker  . Smokeless tobacco: Never Used  . Alcohol use No   Family History  Problem Relation Age of Onset  . Colon cancer Mother    No Known Allergies Current Outpatient Prescriptions on  File Prior to Visit  Medication Sig Dispense Refill  . gemfibrozil (LOPID) 600 MG tablet TAKE 1 TABLET BY MOUTH TWICE A DAY 180 tablet 3  . lisinopril (PRINIVIL,ZESTRIL) 5 MG tablet TAKE 1/2 TABLET BY MOUTH EVERY DAY 45 tablet 1   No current facility-administered medications on file prior to visit.     Review of Systems  Constitutional: Negative for activity change, appetite change, fatigue, fever and unexpected weight change.  HENT: Negative for congestion, rhinorrhea, sore throat and trouble swallowing.   Eyes: Negative for pain, redness, itching and visual disturbance.  Respiratory: Negative for cough, chest tightness, shortness of breath and wheezing.   Cardiovascular: Negative for chest pain and palpitations.  Gastrointestinal: Negative for abdominal pain, blood in stool, constipation, diarrhea and nausea.       Pos for  some arthritis pain   Endocrine: Negative for cold intolerance, heat intolerance, polydipsia and polyuria.  Genitourinary: Negative for difficulty urinating, dysuria, frequency and urgency.  Musculoskeletal: Positive for arthralgias. Negative for joint swelling and myalgias.  Skin: Negative for pallor and rash.  Neurological: Negative for dizziness, tremors, weakness, numbness and headaches.  Hematological: Negative for adenopathy. Does not bruise/bleed easily.  Psychiatric/Behavioral: Negative for decreased concentration and dysphoric mood. The patient is not nervous/anxious.        Objective:   Physical Exam  Constitutional: He appears well-developed and well-nourished. No distress.  obese and well appearing   HENT:  Head: Normocephalic and atraumatic.  Mouth/Throat: Oropharynx is clear and moist.  Eyes: Pupils are equal, round, and reactive to light. Conjunctivae and EOM are normal.  Neck: Normal range of motion. Neck supple. No JVD present. Carotid bruit is not present. No thyromegaly present.  Cardiovascular: Normal rate, regular rhythm, normal heart sounds and intact distal pulses.  Exam reveals no gallop.   Pulmonary/Chest: Effort normal and breath sounds normal. No respiratory distress. He has no wheezes. He has no rales.  No crackles  Abdominal: Soft. Bowel sounds are normal. He exhibits no distension, no abdominal bruit and no mass. There is no tenderness.  Musculoskeletal: He exhibits no edema.  Lymphadenopathy:    He has no cervical adenopathy.  Neurological: He is alert. He has normal reflexes.  Skin: Skin is warm and dry. No rash noted. No pallor.  Psychiatric: He has a normal mood and affect.  Baseline cognitive impairment  Content/nl affect and answers questions appropriately           Assessment & Plan:   Problem List Items Addressed This Visit      Cardiovascular and Mediastinum   HYPERTENSION, BENIGN ESSENTIAL - Primary    bp in fair control at this time   BP Readings from Last 1 Encounters:  03/02/17 128/84   No changes needed Disc lifstyle change with low sodium diet and exercise  Labs reviewed         Other   Hyperglycemia    Lab Results  Component Value Date   HGBA1C 6.5 02/23/2017   This is unchanged Would like to get lower/out of DM range disc imp of low glycemic diet and wt loss to prevent DM2  Family plan is to get him exercising at least 3-5 days per week       Hypertriglyceridemia, essential    Disc goals for lipids and reasons to control them Rev labs with pt Rev low sat fat diet in detail  Triglycerides are still well controlled  LDL is fair in 120s       Obesity    .  Discussed how this problem influences overall health and the risks it imposes  Reviewed plan for weight loss with lower calorie diet (via better food choices and also portion control or program like weight watchers) and exercise building up to or more than 30 minutes 5 days per week including some aerobic activity   [facility] and walk or do other 2 more days  He feels better with exercise as well        Other Visit Diagnoses    Need for influenza vaccination       Relevant Orders   Flu Vaccine QUAD 6+ mos PF IM (Fluarix Quad PF) (Completed)

## 2017-03-04 NOTE — Assessment & Plan Note (Signed)
.  Discussed how this problem influences overall health and the risks it imposes  Reviewed plan for weight loss with lower calorie diet (via better food choices and also portion control or program like weight watchers) and exercise building up to or more than 30 minutes 5 days per week including some aerobic activity   [facility] and walk or do other 2 more days  He feels better with exercise as well

## 2017-03-04 NOTE — Assessment & Plan Note (Addendum)
Lab Results  Component Value Date   HGBA1C 6.5 02/23/2017   This is unchanged Would like to get lower/out of DM range disc imp of low glycemic diet and wt loss to prevent DM2  Family plan is to get him exercising at least 3-5 days per week

## 2017-03-04 NOTE — Assessment & Plan Note (Signed)
Disc goals for lipids and reasons to control them Rev labs with pt Rev low sat fat diet in detail  Triglycerides are still well controlled  LDL is fair in 120s

## 2017-03-04 NOTE — Assessment & Plan Note (Signed)
bp in fair control at this time  BP Readings from Last 1 Encounters:  03/02/17 128/84   No changes needed Disc lifstyle change with low sodium diet and exercise  Labs reviewed

## 2017-04-25 ENCOUNTER — Other Ambulatory Visit: Payer: Self-pay | Admitting: Family Medicine

## 2017-05-31 DIAGNOSIS — M79674 Pain in right toe(s): Secondary | ICD-10-CM | POA: Diagnosis not present

## 2017-05-31 DIAGNOSIS — M79675 Pain in left toe(s): Secondary | ICD-10-CM | POA: Diagnosis not present

## 2017-05-31 DIAGNOSIS — B351 Tinea unguium: Secondary | ICD-10-CM | POA: Diagnosis not present

## 2017-07-31 ENCOUNTER — Encounter (HOSPITAL_COMMUNITY): Payer: Self-pay | Admitting: Family Medicine

## 2017-07-31 ENCOUNTER — Ambulatory Visit (HOSPITAL_COMMUNITY)
Admission: EM | Admit: 2017-07-31 | Discharge: 2017-07-31 | Disposition: A | Discharge status: Home / Self Care | Payer: Medicare Other | Attending: Family Medicine | Admitting: Family Medicine

## 2017-07-31 DIAGNOSIS — K219 Gastro-esophageal reflux disease without esophagitis: Secondary | ICD-10-CM | POA: Diagnosis not present

## 2017-07-31 DIAGNOSIS — R0789 Other chest pain: Secondary | ICD-10-CM

## 2017-07-31 LAB — GLUCOSE, CAPILLARY: GLUCOSE-CAPILLARY: 164 mg/dL — AB (ref 65–99)

## 2017-07-31 NOTE — Discharge Instructions (Signed)
You may try over the counter Zantac twice daily to see if this relieved some of the abdominal discomfort you are having.  Your blood sugar was elevated today (164). Please keep the appointment you have with your primary doctor tomorrow morning to discuss further evaluation.

## 2017-07-31 NOTE — ED Triage Notes (Signed)
Pt here for central chest pain upon waking yesterday. sts some burning in chest and burping. Denies N,V. sts some SOB but no coughing.

## 2017-08-01 ENCOUNTER — Ambulatory Visit (INDEPENDENT_AMBULATORY_CARE_PROVIDER_SITE_OTHER): Payer: Medicare Other | Admitting: Primary Care

## 2017-08-01 ENCOUNTER — Encounter: Payer: Self-pay | Admitting: Primary Care

## 2017-08-01 ENCOUNTER — Other Ambulatory Visit: Payer: Self-pay | Admitting: Primary Care

## 2017-08-01 VITALS — BP 124/70 | HR 82 | Temp 97.8°F | Resp 20 | Ht 66.5 in | Wt 224.8 lb

## 2017-08-01 DIAGNOSIS — R0602 Shortness of breath: Secondary | ICD-10-CM

## 2017-08-01 DIAGNOSIS — E119 Type 2 diabetes mellitus without complications: Secondary | ICD-10-CM

## 2017-08-01 LAB — CBC WITH DIFFERENTIAL/PLATELET
BASOS PCT: 0.9 % (ref 0.0–3.0)
Basophils Absolute: 0.2 10*3/uL — ABNORMAL HIGH (ref 0.0–0.1)
EOS ABS: 0 10*3/uL (ref 0.0–0.7)
Eosinophils Relative: 0.1 % (ref 0.0–5.0)
HEMATOCRIT: 34.9 % — AB (ref 39.0–52.0)
Hemoglobin: 11.8 g/dL — ABNORMAL LOW (ref 13.0–17.0)
LYMPHS ABS: 0.8 10*3/uL (ref 0.7–4.0)
Lymphocytes Relative: 4.3 % — ABNORMAL LOW (ref 12.0–46.0)
MCHC: 33.9 g/dL (ref 30.0–36.0)
MCV: 88.6 fl (ref 78.0–100.0)
MONO ABS: 1.6 10*3/uL — AB (ref 0.1–1.0)
Monocytes Relative: 8.9 % (ref 3.0–12.0)
NEUTROS ABS: 15 10*3/uL — AB (ref 1.4–7.7)
Neutrophils Relative %: 85.8 % — ABNORMAL HIGH (ref 43.0–77.0)
PLATELETS: 372 10*3/uL (ref 150.0–400.0)
RBC: 3.94 Mil/uL — ABNORMAL LOW (ref 4.22–5.81)
RDW: 14.7 % (ref 11.5–15.5)
WBC: 17.5 10*3/uL — ABNORMAL HIGH (ref 4.0–10.5)

## 2017-08-01 LAB — HEMOGLOBIN A1C: Hgb A1c MFr Bld: 6.8 % — ABNORMAL HIGH (ref 4.6–6.5)

## 2017-08-01 NOTE — Patient Instructions (Addendum)
Stop by the lab prior to leaving today. I will notify you of your results once received.   Please call us if you develop cough, fevers, chills, increased shortness of breath.   It was a pleasure meeting you!

## 2017-08-01 NOTE — Progress Notes (Signed)
Subjective:    Patient ID: Daniel Bailey, male    DOB: 04-Apr-1951, 67 y.o.   MRN: [id number]  HPI  Daniel Bailey is a 67 year old male with a history of mental retardation, hypertension, type 2 diabetes controlled who presents today with a chief complaint of shortness of breath.   Symptoms began 3 days ago with shortness of breath with ambulation that improves with rest. He denies fevers, cough, sore throat, chest pain. His caregiver is with him today who reports constipation over the last several days. She has some "smoothe move tea" that she plans on providing him which will typically work well. His caregiver also reports that he'll get "sympathetic" symptoms when he hears of someone who's not feeling well. He had a family member report that they weren't feeling well several days ago.   He was evaluated at urgent care yesterday for same symptoms. They completed ECG, and non fasting glucose reading. ECG with NSR with rate of 99, no ST elevation.  Glucose reading of 167. A1C of 6.5 in October 2018.   Review of Systems  Constitutional: Negative for chills, fatigue and fever.  HENT: Negative for congestion, postnasal drip and sinus pressure.   Respiratory: Positive for shortness of breath. Negative for cough and wheezing.   Cardiovascular: Negative for chest pain and leg swelling.  Neurological: Negative for weakness.       Past Medical History:  Diagnosis Date  . Glaucoma   . Hyperglycemia   . Hyperlipidemia   . Hypertension      Social History   Socioeconomic History  . Marital status: Single    Spouse name: Not on file  . Number of children: Not on file  . Years of education: Not on file  . Highest education level: Not on file  Occupational History  . Not on file  Social Needs  . Financial resource strain: Not on file  . Food insecurity:    Worry: Not on file    Inability: Not on file  . Transportation needs:    Medical: Not on file    Non-medical: Not on file  Tobacco Use    . Smoking status: Never Smoker  . Smokeless tobacco: Never Used  Substance and Sexual Activity  . Alcohol use: No    Alcohol/week: 0.0 oz  . Drug use: No  . Sexual activity: Never  Lifestyle  . Physical activity:    Days per week: Not on file    Minutes per session: Not on file  . Stress: Not on file  Relationships  . Social connections:    Talks on phone: Not on file    Gets together: Not on file    Attends religious service: Not on file    Active member of club or organization: Not on file    Attends meetings of clubs or organizations: Not on file    Relationship status: Not on file  . Intimate partner violence:    Fear of current or ex partner: Not on file    Emotionally abused: Not on file    Physically abused: Not on file    Forced sexual activity: Not on file  Other Topics Concern  . Not on file  Social History Narrative  . Not on file    No past surgical history on file.  Family History  Problem Relation Age of Onset  . Colon cancer Mother     No Known Allergies  Current Outpatient Medications on File Prior to Visit  Medication Sig Dispense Refill  . gemfibrozil (LOPID) 600 MG tablet TAKE 1 TABLET BY MOUTH TWICE A DAY 180 tablet 3  . lisinopril (PRINIVIL,ZESTRIL) 5 MG tablet TAKE 1/2 TABLET BY MOUTH EVERY DAY 45 tablet 1   No current facility-administered medications on file prior to visit.     BP 124/70   Pulse 82   Temp 97.8 F (36.6 C) (Oral)   Resp 20   Ht 5' 6.5" (1.689 m)   Wt 224 lb 12.8 oz (102 kg)   SpO2 97%   BMI 35.74 kg/m    Objective:   Physical Exam  Constitutional: He appears well-nourished.  HENT:  Right Ear: Tympanic membrane and ear canal normal.  Left Ear: Tympanic membrane and ear canal normal.  Nose: No mucosal edema. Right sinus exhibits no maxillary sinus tenderness and no frontal sinus tenderness. Left sinus exhibits no maxillary sinus tenderness and no frontal sinus tenderness.  Mouth/Throat: Oropharynx is clear and  moist.  Eyes: Conjunctivae are normal.  Neck: Neck supple.  Cardiovascular: Normal rate and regular rhythm.  Pulmonary/Chest: Effort normal and breath sounds normal. He has no wheezes. He has no rales.  Skin: Skin is warm and dry.          Assessment & Plan:  Shortness of Breath:  Present for the last several days. Evaluated at urgent care and had normal ECG. Exam today without evidence of viral/bacterial involvement. No cardinal symptoms of CHF, does not appear volume overloaded.  Will check CBC for any potential anemia. Also repeat A1C given new diabetes diagnosis in October 2018. Will have his caregiver provide the herbal tea for constipation, this could be causing fullness for which he feels short of breath.  Pleas Koch, NP

## 2017-08-01 NOTE — Progress Notes (Signed)
Subjective:    Patient ID: Daniel Bailey, male    DOB: [birth year], 67 y.o.   MRN: [id number]  HPI [name] is a 67 y.o. male who presents today with a CC of Shortness of breath. Pt is present with caregiver, [name], and helps give history. Symptoms started on Sunday and complains that his chest hurts and he gets winded when walking. Symptoms relieved upon sitting down. Did visit an Urgent Care yesterday and work up was negative.   Caregiver states that patient's mother was not feeling well last week and that patient will start "not feeling well".   Review of Systems  Constitutional: Negative for activity change, chills and fever.  HENT: Negative for congestion, ear pain, rhinorrhea, sinus pressure and sore throat.   Respiratory: Negative for cough.        Past Medical History:  Diagnosis Date  . Glaucoma   . Hyperglycemia   . Hyperlipidemia   . Hypertension    No past surgical history on file. Family History  Problem Relation Age of Onset  . Colon cancer Mother    Social History   Socioeconomic History  . Marital status: Single    Spouse name: Not on file  . Number of children: Not on file  . Years of education: Not on file  . Highest education level: Not on file  Occupational History  . Not on file  Social Needs  . Financial resource strain: Not on file  . Food insecurity:    Worry: Not on file    Inability: Not on file  . Transportation needs:    Medical: Not on file    Non-medical: Not on file  Tobacco Use  . Smoking status: Never Smoker  . Smokeless tobacco: Never Used  Substance and Sexual Activity  . Alcohol use: No    Alcohol/week: 0.0 oz  . Drug use: No  . Sexual activity: Never  Lifestyle  . Physical activity:    Days per week: Not on file    Minutes per session: Not on file  . Stress: Not on file  Relationships  . Social connections:    Talks on phone: Not on file    Gets together: Not on file    Attends religious service: Not on file    Active  member of club or organization: Not on file    Attends meetings of clubs or organizations: Not on file    Relationship status: Not on file  . Intimate partner violence:    Fear of current or ex partner: Not on file    Emotionally abused: Not on file    Physically abused: Not on file    Forced sexual activity: Not on file  Other Topics Concern  . Not on file  Social History Narrative  . Not on file   Current Outpatient Medications on File Prior to Visit  Medication Sig Dispense Refill  . gemfibrozil (LOPID) 600 MG tablet TAKE 1 TABLET BY MOUTH TWICE A DAY 180 tablet 3  . lisinopril (PRINIVIL,ZESTRIL) 5 MG tablet TAKE 1/2 TABLET BY MOUTH EVERY DAY 45 tablet 1   No current facility-administered medications on file prior to visit.     Objective:   Physical Exam  Constitutional: He is oriented to person, place, and time. He appears well-nourished.  HENT:  Right Ear: Tympanic membrane normal. Tympanic membrane is not erythematous and not bulging.  Left Ear: Tympanic membrane normal. Tympanic membrane is not erythematous and not bulging.  Neck: Neck supple.  Cardiovascular: Normal rate and regular rhythm. Exam reveals no gallop and no friction rub.  No murmur heard. Pulmonary/Chest: Effort normal and breath sounds normal.  Lymphadenopathy:    He has no cervical adenopathy.  Neurological: He is alert and oriented to person, place, and time.    BP 124/70   Pulse 82   Temp 97.8 F (36.6 C) (Oral)   Resp 20   Ht 5' 6.5" (1.689 m)   Wt 224 lb 12.8 oz (102 kg)   SpO2 97%   BMI 35.74 kg/m   Reviewed EKG from yesterday. NSR with LAE     Assessment & Plan:   1. Type 2 diabetes mellitus without complication, without long-term current use of insulin (HCC) - Hemoglobin A1c  2. Shortness of breath - CBC with Differential/Platelet  Denita Lung, RN, Adult-Geriatric Nurse Practitioner Student

## 2017-08-03 ENCOUNTER — Other Ambulatory Visit: Payer: Self-pay | Admitting: Primary Care

## 2017-08-03 DIAGNOSIS — R0602 Shortness of breath: Secondary | ICD-10-CM

## 2017-08-06 ENCOUNTER — Other Ambulatory Visit (INDEPENDENT_AMBULATORY_CARE_PROVIDER_SITE_OTHER): Payer: Medicare Other

## 2017-08-06 ENCOUNTER — Ambulatory Visit (INDEPENDENT_AMBULATORY_CARE_PROVIDER_SITE_OTHER)
Admission: RE | Admit: 2017-08-06 | Discharge: 2017-08-06 | Disposition: A | Discharge status: Home / Self Care | Payer: Medicare Other | Source: Ambulatory Visit | Attending: Primary Care | Admitting: Primary Care

## 2017-08-06 DIAGNOSIS — R0602 Shortness of breath: Secondary | ICD-10-CM | POA: Diagnosis not present

## 2017-08-06 LAB — CBC WITH DIFFERENTIAL/PLATELET
BASOS PCT: 0.6 % (ref 0.0–3.0)
Basophils Absolute: 0 10*3/uL (ref 0.0–0.1)
EOS PCT: 0.7 % (ref 0.0–5.0)
Eosinophils Absolute: 0 10*3/uL (ref 0.0–0.7)
HEMATOCRIT: 35.6 % — AB (ref 39.0–52.0)
HEMOGLOBIN: 12.3 g/dL — AB (ref 13.0–17.0)
LYMPHS PCT: 23.7 % (ref 12.0–46.0)
Lymphs Abs: 1.4 10*3/uL (ref 0.7–4.0)
MCHC: 34.5 g/dL (ref 30.0–36.0)
MCV: 88.2 fl (ref 78.0–100.0)
MONO ABS: 0.5 10*3/uL (ref 0.1–1.0)
MONOS PCT: 8.7 % (ref 3.0–12.0)
Neutro Abs: 3.8 10*3/uL (ref 1.4–7.7)
Neutrophils Relative %: 66.3 % (ref 43.0–77.0)
Platelets: 659 10*3/uL — ABNORMAL HIGH (ref 150.0–400.0)
RBC: 4.04 Mil/uL — AB (ref 4.22–5.81)
RDW: 14.3 % (ref 11.5–15.5)
WBC: 5.7 10*3/uL (ref 4.0–10.5)

## 2017-08-08 NOTE — ED Provider Notes (Signed)
Reading   [id number] 07/31/17 Arrival Time: 7353  ASSESSMENT & PLAN:  1. Gastroesophageal reflux disease without esophagitis    See AVS. Chest pain precautions given. Reviewed expectations re: course of current medical issues. Questions answered. Outlined signs and symptoms indicating need for more acute intervention. Patient verbalized understanding. After Visit Summary given.   SUBJECTIVE:  Daniel Bailey is a 67 y.o. male who presents with complaint of:  Chest Discomfort along with increased belching. Onset gradual, over this past week; on/off longer. Describes discomfort as burning feeling after eating. No SOB/n/v. No LE edema. No recent illnesses. No specific abdominal pain or back pain. Sleeping well. No stress/anxiety. No specific aggravating or alleviating factors reported except for feeling more after she eats.  OTC treatment: none.  ROS: As per HPI. All other systems negative.    OBJECTIVE:  Vitals:   07/31/17 1429  BP: 133/82  Pulse: (!) 102  Temp: 98.5 F (36.9 C)  TempSrc: Oral  SpO2: 100%    General appearance: alert; no distress Eyes: PERRLA; EOMI; conjunctiva normal HENT: normocephalic; atraumatic Neck: supple Lungs: clear to auscultation bilaterally Heart: regular rate and rhythm Chest Wall: non-tender Abdomen: soft, non-tender; bowel sounds normal; no masses or organomegaly; no guarding or rebound tenderness Extremities: no cyanosis or edema; symmetrical with no gross deformities Skin: warm and dry Psychological: alert and cooperative; normal mood and affect  ECG: Orders placed or performed during the hospital encounter of 07/31/17  . ED EKG  . ED EKG    Labs: Results for orders placed or performed during the hospital encounter of 07/31/17  Glucose, capillary  Result Value Ref Range   Glucose-Capillary 164 (H) 65 - 99 mg/dL   Labs Reviewed  GLUCOSE, CAPILLARY - Abnormal; Notable for the following components:      Result  Value   Glucose-Capillary 164 (*)    All other components within normal limits    Imaging: Dg Chest 2 View  Result Date: 08/06/2017 CLINICAL DATA:  Short of breath EXAM: CHEST - 2 VIEW COMPARISON:  None. FINDINGS: The heart size and mediastinal contours are within normal limits. Both lungs are clear. The visualized skeletal structures are unremarkable. IMPRESSION: No active cardiopulmonary disease. Electronically Signed   By: Franchot Gallo M.D.   On: 08/06/2017 13:06    No Known Allergies  Past Medical History:  Diagnosis Date  . Glaucoma   . Hyperglycemia   . Hyperlipidemia   . Hypertension    Social History   Socioeconomic History  . Marital status: Single    Spouse name: Not on file  . Number of children: Not on file  . Years of education: Not on file  . Highest education level: Not on file  Occupational History  . Not on file  Social Needs  . Financial resource strain: Not on file  . Food insecurity:    Worry: Not on file    Inability: Not on file  . Transportation needs:    Medical: Not on file    Non-medical: Not on file  Tobacco Use  . Smoking status: Never Smoker  . Smokeless tobacco: Never Used  Substance and Sexual Activity  . Alcohol use: No    Alcohol/week: 0.0 oz  . Drug use: No  . Sexual activity: Never  Lifestyle  . Physical activity:    Days per week: Not on file    Minutes per session: Not on file  . Stress: Not on file  Relationships  . Social connections:  Talks on phone: Not on file    Gets together: Not on file    Attends religious service: Not on file    Active member of club or organization: Not on file    Attends meetings of clubs or organizations: Not on file    Relationship status: Not on file  . Intimate partner violence:    Fear of current or ex partner: Not on file    Emotionally abused: Not on file    Physically abused: Not on file    Forced sexual activity: Not on file  Other Topics Concern  . Not on file  Social  History Narrative  . Not on file   Family History  Problem Relation Age of Onset  . Colon cancer Mother    History reviewed. No pertinent surgical history.   Vanessa Kick, MD 08/08/17 [phone]

## 2017-08-28 ENCOUNTER — Telehealth: Payer: Self-pay | Admitting: Family Medicine

## 2017-08-28 DIAGNOSIS — Z125 Encounter for screening for malignant neoplasm of prostate: Secondary | ICD-10-CM

## 2017-08-28 DIAGNOSIS — E781 Pure hyperglyceridemia: Secondary | ICD-10-CM

## 2017-08-28 DIAGNOSIS — I10 Essential (primary) hypertension: Secondary | ICD-10-CM

## 2017-08-28 DIAGNOSIS — R739 Hyperglycemia, unspecified: Secondary | ICD-10-CM

## 2017-08-28 NOTE — Telephone Encounter (Signed)
-----   Message from Eustace Pen, LPN sent at 12/20/9468  5:12 PM EDT ----- Regarding: Labs 08/29/17 Lab orders needed. Thank you.  Insurance:  [employer]

## 2017-08-29 ENCOUNTER — Ambulatory Visit (INDEPENDENT_AMBULATORY_CARE_PROVIDER_SITE_OTHER): Payer: Medicare Other

## 2017-08-29 VITALS — BP 118/78 | HR 80 | Temp 97.7°F | Ht 67.0 in | Wt 219.5 lb

## 2017-08-29 DIAGNOSIS — Z0000 Encounter for general adult medical examination without abnormal findings: Secondary | ICD-10-CM

## 2017-08-29 DIAGNOSIS — Z125 Encounter for screening for malignant neoplasm of prostate: Secondary | ICD-10-CM

## 2017-08-29 DIAGNOSIS — I10 Essential (primary) hypertension: Secondary | ICD-10-CM | POA: Diagnosis not present

## 2017-08-29 DIAGNOSIS — R739 Hyperglycemia, unspecified: Secondary | ICD-10-CM

## 2017-08-29 DIAGNOSIS — E781 Pure hyperglyceridemia: Secondary | ICD-10-CM | POA: Diagnosis not present

## 2017-08-29 LAB — LIPID PANEL
CHOL/HDL RATIO: 4
CHOLESTEROL: 165 mg/dL (ref 0–200)
HDL: 39.1 mg/dL (ref >39.00)
LDL Cholesterol: 102 mg/dL — ABNORMAL HIGH (ref 0–99)
NonHDL: 125.46
TRIGLYCERIDES: 115 mg/dL (ref 0.0–149.0)
VLDL: 23 mg/dL (ref 0.0–40.0)

## 2017-08-29 LAB — COMPREHENSIVE METABOLIC PANEL
ALBUMIN: 4.1 g/dL (ref 3.5–5.2)
ALT: 15 U/L (ref 0–53)
AST: 19 U/L (ref 0–37)
Alkaline Phosphatase: 71 U/L (ref 39–117)
BUN: 14 mg/dL (ref 6–23)
CALCIUM: 9.2 mg/dL (ref 8.4–10.5)
CHLORIDE: 100 meq/L (ref 96–112)
CO2: 26 mEq/L (ref 19–32)
CREATININE: 0.99 mg/dL (ref 0.40–1.50)
GFR: 96.78 mL/min (ref >60.00)
Glucose, Bld: 108 mg/dL — ABNORMAL HIGH (ref 70–99)
Potassium: 4.6 mEq/L (ref 3.5–5.1)
SODIUM: 133 meq/L — AB (ref 135–145)
TOTAL PROTEIN: 7.3 g/dL (ref 6.0–8.3)
Total Bilirubin: 0.4 mg/dL (ref 0.2–1.2)

## 2017-08-29 LAB — CBC WITH DIFFERENTIAL/PLATELET
BASOS ABS: 0 10*3/uL (ref 0.0–0.1)
BASOS PCT: 0.7 % (ref 0.0–3.0)
EOS ABS: 0 10*3/uL (ref 0.0–0.7)
Eosinophils Relative: 1.6 % (ref 0.0–5.0)
HEMATOCRIT: 35.1 % — AB (ref 39.0–52.0)
HEMOGLOBIN: 11.9 g/dL — AB (ref 13.0–17.0)
LYMPHS PCT: 43.1 % (ref 12.0–46.0)
Lymphs Abs: 1.3 10*3/uL (ref 0.7–4.0)
MCHC: 33.9 g/dL (ref 30.0–36.0)
MCV: 89 fl (ref 78.0–100.0)
MONO ABS: 0.4 10*3/uL (ref 0.1–1.0)
Monocytes Relative: 12.8 % — ABNORMAL HIGH (ref 3.0–12.0)
Neutro Abs: 1.2 10*3/uL — ABNORMAL LOW (ref 1.4–7.7)
Neutrophils Relative %: 41.8 % — ABNORMAL LOW (ref 43.0–77.0)
Platelets: 414 10*3/uL — ABNORMAL HIGH (ref 150.0–400.0)
RBC: 3.95 Mil/uL — ABNORMAL LOW (ref 4.22–5.81)
RDW: 14.2 % (ref 11.5–15.5)
WBC: 2.9 10*3/uL — AB (ref 4.0–10.5)

## 2017-08-29 LAB — HEMOGLOBIN A1C: Hgb A1c MFr Bld: 6.8 % — ABNORMAL HIGH (ref 4.6–6.5)

## 2017-08-29 LAB — PSA, MEDICARE: PSA: 7.62 ng/mL — AB (ref 0.10–4.00)

## 2017-08-29 LAB — TSH: TSH: 2.06 u[IU]/mL (ref 0.35–4.50)

## 2017-08-29 NOTE — Progress Notes (Signed)
PCP notes:   Health maintenance:  Foot exam - PCP please address at next appt A1C - completed  Abnormal screenings:   None  Patient concerns:   None  Nurse concerns:  None  Next PCP appt:   09/05/17 @ 1430  I reviewed health advisor's note, was available for consultation, and agree with documentation and plan. Loura Pardon MD

## 2017-08-29 NOTE — Patient Instructions (Signed)
[name] , Thank you for taking time to come for your Medicare Wellness Visit. I appreciate your ongoing commitment to your health goals. Please review the following plan we discussed and let me know if I can assist you in the future.   These are the goals we discussed: Goals    . Increase physical activity     Weather permitting, I will continue to walk for 10 minutes daily.        This is a list of the screening recommended for you and due dates:  Health Maintenance  Topic Date Due  . Complete foot exam   10/05/2017*  . Eye exam for diabetics  09/05/2018*  . Flu Shot  12/06/2017  . Hemoglobin A1C  02/28/2018  . Tetanus Vaccine  11/24/2019  . Colon Cancer Screening  04/10/2022  .  Hepatitis C: One time screening is recommended by Center for Disease Control  (CDC) for  adults born from [birth year] through 1966.   Completed  . Pneumonia vaccines  Completed  *Topic was postponed. The date shown is not the original due date.   Preventive Care for Adults  A healthy lifestyle and preventive care can promote health and wellness. Preventive health guidelines for adults include the following key practices.  . A routine yearly physical is a good way to check with your health care provider about your health and preventive screening. It is a chance to share any concerns and updates on your health and to receive a thorough exam.  . Visit your dentist for a routine exam and preventive care every 6 months. Brush your teeth twice a day and floss once a day. Good oral hygiene prevents tooth decay and gum disease.  . The frequency of eye exams is based on your age, health, family medical history, use  of contact lenses, and other factors. Follow your health care provider's recommendations for frequency of eye exams.  . Eat a healthy diet. Foods like vegetables, fruits, whole grains, low-fat dairy products, and lean protein foods contain the nutrients you need without too many calories. Decrease your  intake of foods high in solid fats, added sugars, and salt. Eat the right amount of calories for you. Get information about a proper diet from your health care provider, if necessary.  . Regular physical exercise is one of the most important things you can do for your health. Most adults should get at least 150 minutes of moderate-intensity exercise (any activity that increases your heart rate and causes you to sweat) each week. In addition, most adults need muscle-strengthening exercises on 2 or more days a week.  Silver Sneakers may be a benefit available to you. To determine eligibility, you may visit the website: www.silversneakers.com or contact program at [phone] Mon-Fri between 8AM-8PM.   . Maintain a healthy weight. The body mass index (BMI) is a screening tool to identify possible weight problems. It provides an estimate of body fat based on height and weight. Your health care provider can find your BMI and can help you achieve or maintain a healthy weight.   For adults 20 years and older: ? A BMI below 18.5 is considered underweight. ? A BMI of 18.5 to 24.9 is normal. ? A BMI of 25 to 29.9 is considered overweight. ? A BMI of 30 and above is considered obese.   . Maintain normal blood lipids and cholesterol levels by exercising and minimizing your intake of saturated fat. Eat a balanced diet with plenty of fruit and  vegetables. Blood tests for lipids and cholesterol should begin at age 32 and be repeated every 5 years. If your lipid or cholesterol levels are high, you are over 50, or you are at high risk for heart disease, you may need your cholesterol levels checked more frequently. Ongoing high lipid and cholesterol levels should be treated with medicines if diet and exercise are not working.  . If you smoke, find out from your health care provider how to quit. If you do not use tobacco, please do not start.  . If you choose to drink alcohol, please do not consume more than 2  drinks per day. One drink is considered to be 12 ounces (355 mL) of beer, 5 ounces (148 mL) of wine, or 1.5 ounces (44 mL) of liquor.  . If you are 47-89 years old, ask your health care provider if you should take aspirin to prevent strokes.  . Use sunscreen. Apply sunscreen liberally and repeatedly throughout the day. You should seek shade when your shadow is shorter than you. Protect yourself by wearing long sleeves, pants, a wide-brimmed hat, and sunglasses year round, whenever you are outdoors.  . Once a month, do a whole body skin exam, using a mirror to look at the skin on your back. Tell your health care provider of new moles, moles that have irregular borders, moles that are larger than a pencil eraser, or moles that have changed in shape or color.

## 2017-08-29 NOTE — Progress Notes (Signed)
Subjective:   Daniel Bailey is a 67 y.o. male who presents for Medicare Annual/Subsequent preventive examination.  Review of Systems:  N/A Cardiac Risk Factors include: advanced age (>62men, >16 women);male gender;hypertension;dyslipidemia;obesity (BMI >30kg/m2)     Objective:    Vitals: BP 118/78 (BP Location: Right Arm, Patient Position: Sitting, Cuff Size: Normal)   Pulse 80   Temp 97.7 F (36.5 C) (Oral)   Ht 5\' 7"  (1.702 m) Comment: shoes  Wt 219 lb 8 oz (99.6 kg)   SpO2 99%   BMI 34.38 kg/m   Body mass index is 34.38 kg/m.  Advanced Directives 08/29/2017 08/25/2016  Does Patient Have a Medical Advance Directive? Yes No  Type of Paramedic of [place];Living will -  Copy of [place] in Chart? No - copy requested -    Tobacco Social History   Tobacco Use  Smoking Status Never Smoker  Smokeless Tobacco Never Used     Counseling given: No   Clinical Intake:  Pre-visit preparation completed: Yes  Pain : No/denies pain Pain Score: 0-No pain     Nutritional Status: BMI > 30  Obese Nutritional Risks: None Diabetes: No  How often do you need to have someone help you when you read instructions, pamphlets, or other written materials from your doctor or pharmacy?: 3 - Sometimes What is the last grade level you completed in school?: special education classes  Interpreter Needed?: No  Comments: pt lives with mother Information entered by :: LPinson, LPN  Past Medical History:  Diagnosis Date  . Glaucoma   . Hyperglycemia   . Hyperlipidemia   . Hypertension    History reviewed. No pertinent surgical history. Family History  Problem Relation Age of Onset  . Colon cancer Mother    Social History   Socioeconomic History  . Marital status: Single    Spouse name: Not on file  . Number of children: Not on file  . Years of education: Not on file  . Highest education level: Not on file  Occupational History  .  Not on file  Social Needs  . Financial resource strain: Not on file  . Food insecurity:    Worry: Not on file    Inability: Not on file  . Transportation needs:    Medical: Not on file    Non-medical: Not on file  Tobacco Use  . Smoking status: Never Smoker  . Smokeless tobacco: Never Used  Substance and Sexual Activity  . Alcohol use: No    Alcohol/week: 0.0 oz  . Drug use: No  . Sexual activity: Never  Lifestyle  . Physical activity:    Days per week: Not on file    Minutes per session: Not on file  . Stress: Not on file  Relationships  . Social connections:    Talks on phone: Not on file    Gets together: Not on file    Attends religious service: Not on file    Active member of club or organization: Not on file    Attends meetings of clubs or organizations: Not on file    Relationship status: Not on file  Other Topics Concern  . Not on file  Social History Narrative  . Not on file    Outpatient Encounter Medications as of 08/29/2017  Medication Sig  . gemfibrozil (LOPID) 600 MG tablet TAKE 1 TABLET BY MOUTH TWICE A DAY  . lisinopril (PRINIVIL,ZESTRIL) 5 MG tablet TAKE 1/2 TABLET BY MOUTH EVERY  DAY   No facility-administered encounter medications on file as of 08/29/2017.     Activities of Daily Living In your present state of health, do you have any difficulty performing the following activities: 08/29/2017  Hearing? N  Vision? N  Difficulty concentrating or making decisions? Y  Walking or climbing stairs? N  Dressing or bathing? N  Doing errands, shopping? Y  Preparing Food and eating ? N  Using the Toilet? N  In the past six months, have you accidently leaked urine? N  Do you have problems with loss of bowel control? N  Managing your Medications? N  Managing your Finances? Y  Housekeeping or managing your Housekeeping? Y  Some recent data might be hidden    Patient Care Team: Tower, Wynelle Fanny, MD as PCP - General   Assessment:   This is a routine  wellness examination for Daniel Bailey.   Hearing Screening   125Hz  250Hz  500Hz  1000Hz  2000Hz  3000Hz  4000Hz  6000Hz  8000Hz   Right ear:   40 40 40  40    Left ear:   40 40 40  40       Exercise Activities and Dietary recommendations Current Exercise Habits: Home exercise routine, Type of exercise: walking, Time (Minutes): 10, Frequency (Times/Week): 7, Weekly Exercise (Minutes/Week): 70, Intensity: Mild, Exercise limited by: None identified  Goals    . Increase physical activity     Weather permitting, I will continue to walk for 10 minutes daily.        Fall Risk Fall Risk  08/29/2017 08/25/2016 08/25/2015 08/21/2014 08/19/2013  Falls in the past year? No No No No No   IDepression Screen PHQ 2/9 Scores 08/29/2017 08/25/2016 08/25/2015 08/21/2014  PHQ - 2 Score 0 0 0 0  PHQ- 9 Score 0 - - -    Cognitive Function MMSE - Mini Mental State Exam 08/29/2017 08/25/2016  Not completed: Unable to complete Unable to complete      Immunization History  Administered Date(s) Administered  . Influenza Split 02/19/2012  . Influenza,inj,Quad PF,6+ Mos 02/28/2013, 02/18/2014, 02/22/2015, 02/25/2016, 03/02/2017  . Pneumococcal Conjugate-13 08/25/2015  . Pneumococcal Polysaccharide-23 08/25/2016  . Td 02/03/1997, 12/10/2006, 11/23/2009   Screening Tests Health Maintenance  Topic Date Due  . FOOT EXAM  10/05/2017 (Originally 08/24/2016)  . OPHTHALMOLOGY EXAM  09/05/2018 (Originally 01/08/1961)  . INFLUENZA VACCINE  12/06/2017  . HEMOGLOBIN A1C  02/28/2018  . TETANUS/TDAP  11/24/2019  . COLONOSCOPY  04/10/2022  . Hepatitis C Screening  Completed  . PNA vac Low Risk Adult  Completed       Plan:     I have personally reviewed, addressed, and noted the following in the patient's chart:  A. Medical and social history B. Use of alcohol, tobacco or illicit drugs  C. Current medications and supplements D. Functional ability and status E.  Nutritional status F.  Physical activity G. Advance  directives H. List of other physicians I.  Hospitalizations, surgeries, and ER visits in previous 12 months J.  [place] to include hearing, vision, cognitive, depression L. Referrals and appointments - none  In addition, I have reviewed and discussed with patient certain preventive protocols, quality metrics, and best practice recommendations. A written personalized care plan for preventive services as well as general preventive health recommendations were provided to patient.  See attached scanned questionnaire for additional information.   Signed,   Lindell Noe, MHA, BS, LPN Health Coach

## 2017-09-05 ENCOUNTER — Ambulatory Visit (INDEPENDENT_AMBULATORY_CARE_PROVIDER_SITE_OTHER): Payer: Medicare Other | Admitting: Family Medicine

## 2017-09-05 ENCOUNTER — Encounter: Payer: Self-pay | Admitting: Family Medicine

## 2017-09-05 VITALS — BP 116/72 | HR 75 | Temp 98.3°F | Ht 67.0 in | Wt 222.5 lb

## 2017-09-05 DIAGNOSIS — Z125 Encounter for screening for malignant neoplasm of prostate: Secondary | ICD-10-CM

## 2017-09-05 DIAGNOSIS — E781 Pure hyperglyceridemia: Secondary | ICD-10-CM | POA: Diagnosis not present

## 2017-09-05 DIAGNOSIS — E119 Type 2 diabetes mellitus without complications: Secondary | ICD-10-CM | POA: Diagnosis not present

## 2017-09-05 DIAGNOSIS — R972 Elevated prostate specific antigen [PSA]: Secondary | ICD-10-CM | POA: Diagnosis not present

## 2017-09-05 DIAGNOSIS — Z6834 Body mass index (BMI) 34.0-34.9, adult: Secondary | ICD-10-CM

## 2017-09-05 DIAGNOSIS — I10 Essential (primary) hypertension: Secondary | ICD-10-CM

## 2017-09-05 DIAGNOSIS — E6609 Other obesity due to excess calories: Secondary | ICD-10-CM

## 2017-09-05 DIAGNOSIS — D709 Neutropenia, unspecified: Secondary | ICD-10-CM | POA: Insufficient documentation

## 2017-09-05 DIAGNOSIS — F79 Unspecified intellectual disabilities: Secondary | ICD-10-CM

## 2017-09-05 MED ORDER — GEMFIBROZIL 600 MG PO TABS: 600.0000 mg | ORAL_TABLET | Freq: Two times a day (BID) | ORAL | 3 refills | Status: DC

## 2017-09-05 MED ORDER — LISINOPRIL 5 MG PO TABS: 2.5000 mg | ORAL_TABLET | Freq: Every day | ORAL | 3 refills | Status: DC

## 2017-09-05 NOTE — Patient Instructions (Addendum)
Daniel Bailey is due for a visit to the eye doctor -needs a diabetic eye exam   Avoid added sugar  Try to get most of your carbohydrates from produce (with the exception of white potatoes)  Eat less bread/pasta/rice/snack foods/cereals/sweets and other items from the middle of the grocery store (processed carbs)  Make sure all drinks are sugar free   I will refer [name] to a urologist for elevated PSA

## 2017-09-05 NOTE — Progress Notes (Signed)
Subjective:    Patient ID: Daniel Bailey, male    DOB: 02-12-1951, 67 y.o.   MRN: [id number]  HPI Here for annual f/u of chronic medical problems   No health changes  Is doing ok overall  Hard time controlling weight  Wt Readings from Last 3 Encounters:  09/05/17 222 lb 8 oz (100.9 kg)  08/29/17 219 lb 8 oz (99.6 kg)  08/01/17 224 lb 12.8 oz (102 kg)  until mother went in the hospital was better with diet  More fruits and vegetables (really likes them)  Not exercising  Working on getting him a phone so he can get reminders  34.85 kg/m   Had amw on 4/24 No concerns   Last eye exam - ? Sure when last one was/family will set it up   Colonoscopy 12/13 with 10 y recall   Prostate health  Lab Results  Component Value Date   PSA 7.62 (H) 08/29/2017   PSA 1.11 08/25/2016   PSA 0.95 08/18/2015   this is increased significantly  Father may have had prostate cancer in late years  Nocturia 1-2 times at night  No trouble with stream that family knows of   No bowel changes  occ constipation-not often    bp is stable today  No cp or palpitations or headaches or edema  No side effects to medicines  BP Readings from Last 3 Encounters:  09/05/17 116/72  08/29/17 118/78  08/01/17 124/70     Lab Results  Component Value Date   HGBA1C 6.8 (H) 08/29/2017  no change at all- in early diabetic range  He does drink water   Lab Results  Component Value Date   CREATININE 0.99 08/29/2017   BUN 14 08/29/2017   NA 133 (L) 08/29/2017   K 4.6 08/29/2017   CL 100 08/29/2017   CO2 26 08/29/2017   Lab Results  Component Value Date   ALT 15 08/29/2017   AST 19 08/29/2017   ALKPHOS 71 08/29/2017   BILITOT 0.4 08/29/2017      Hyperlipidemia/triglyceridemia Lab Results  Component Value Date   CHOL 165 08/29/2017   CHOL 187 02/23/2017   CHOL 195 08/25/2016   Lab Results  Component Value Date   HDL 39.10 08/29/2017   HDL 38.10 (L) 02/23/2017   HDL 46.20 08/25/2016   Lab  Results  Component Value Date   LDLCALC 102 (H) 08/29/2017   LDLCALC 126 (H) 02/23/2017   LDLCALC 128 (H) 08/25/2016   Lab Results  Component Value Date   TRIG 115.0 08/29/2017   TRIG 114.0 02/23/2017   TRIG 105.0 08/25/2016   Lab Results  Component Value Date   CHOLHDL 4 08/29/2017   CHOLHDL 5 02/23/2017   CHOLHDL 4 08/25/2016   Lab Results  Component Value Date   LDLDIRECT 175.3 08/16/2009  doing well with cholesterol   Hx of neutropenia Lab Results  Component Value Date   WBC 2.9 (L) 08/29/2017   HGB 11.9 (L) 08/29/2017   HCT 35.1 (L) 08/29/2017   MCV 89.0 08/29/2017   PLT 414.0 (H) 08/29/2017      Patient Active Problem List   Diagnosis Date Noted  . Neutropenia ([place]) 09/05/2017  . Elevated PSA 09/05/2017  . 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 Hypertriglyceridemia, essential 02/19/2014  . Encounter for Medicare annual wellness exam 08/27/2012  . Obesity 08/18/2011  . Colon cancer screening 08/18/2011  . Routine general medical examination at a health care facility 08/10/2011  . Prostate cancer screening 08/10/2011  .  Controlled type 2 diabetes mellitus without complication, without long-term current use of insulin ([place]) 05/30/2010  . HYPERTENSION, BENIGN ESSENTIAL 12/10/2006  . MENTAL RETARDATION 11/23/2006   Past Medical History:  Diagnosis Date  . Glaucoma   . Hyperglycemia   . Hyperlipidemia   . Hypertension    History reviewed. No pertinent surgical history. Social History   Tobacco Use  . Smoking status: Never Smoker  . Smokeless tobacco: Never Used  Substance Use Topics  . Alcohol use: No    Alcohol/week: 0.0 oz  . Drug use: No   Family History  Problem Relation Age of Onset  . Colon cancer Mother    No Known Allergies No current outpatient medications on file prior to visit.   No current facility-administered medications on file prior to visit.      Review of Systems     Objective:   Physical Exam  Constitutional: He appears  well-developed and well-nourished. No distress.  obese and well appearing   HENT:  Head: Normocephalic and atraumatic.  Right Ear: External ear normal.  Left Ear: External ear normal.  Nose: Nose normal.  Mouth/Throat: Oropharynx is clear and moist.  Eyes: Pupils are equal, round, and reactive to light. Conjunctivae and EOM are normal. Right eye exhibits no discharge. Left eye exhibits no discharge. No scleral icterus.  Neck: Normal range of motion. Neck supple. No JVD present. Carotid bruit is not present. No thyromegaly present.  Cardiovascular: Normal rate, regular rhythm, normal heart sounds and intact distal pulses. Exam reveals no gallop.  Pulmonary/Chest: Effort normal and breath sounds normal. No respiratory distress. He has no wheezes. He exhibits no tenderness.  Abdominal: Soft. Bowel sounds are normal. He exhibits no distension, no abdominal bruit and no mass. There is no tenderness.  Baseline umbilical hernia   Musculoskeletal: He exhibits no edema or tenderness.  Lymphadenopathy:    He has no cervical adenopathy.  Neurological: He is alert. He has normal reflexes. No cranial nerve deficit. He exhibits normal muscle tone. Coordination normal.  Skin: Skin is warm and dry. No rash noted. No erythema. No pallor.  Pleasant  Baseline mental impairment  Psychiatric: He has a normal mood and affect.          Assessment & Plan:   Problem List Items Addressed This Visit      Cardiovascular and Mediastinum   HYPERTENSION, BENIGN ESSENTIAL - Primary    bp in fair control at this time  BP Readings from Last 1 Encounters:  09/05/17 116/72   No changes needed Disc lifstyle change with low sodium diet and exercise   Labs reviewed  Better lifestyle habits encouraged      Relevant Medications   gemfibrozil (LOPID) 600 MG tablet   lisinopril (PRINIVIL,ZESTRIL) 5 MG tablet     Endocrine   Controlled type 2 diabetes mellitus without complication, without long-term current  use of insulin (H[facility])    Lab Results  Component Value Date   HGBA1C 6.8 (H) 08/29/2017   Unchanged disc imp of low glycemic diet and wt loss to prevent worsening  On ace Disc eye and foot care  Family will take him for eye exam  F/u 6 mo       Relevant Medications   lisinopril (PRINIVIL,ZESTRIL) 5 MG tablet     Other   Elevated PSA    Lab Results  Component Value Date   PSA 7.62 (H) 08/29/2017   PSA 1.11 08/25/2016   PSA 0.95 08/18/2015   No urinary  symptoms or changes  Ref to urology  Unsure of family hx       Relevant Orders   Ambulatory referral to Urology   Hypertriglyceridemia, essential    Disc goals for lipids and reasons to control them Rev labs with pt Rev low sat fat diet in detail LDL is down some at 102 Apprehensive to start statin in light of gemfibrozil Trig are well controlled        Relevant Medications   gemfibrozil (LOPID) 600 MG tablet   lisinopril (PRINIVIL,ZESTRIL) 5 MG tablet   Leukocytopenia    This comes and goes Cbc ref  Wbc 2.9 Trig 414 No symptoms or current infection  Continue to follow       MENTAL RETARDATION    No change in status  Family takes good care of him       Neutropenia ([place])   Obesity    Discussed how this problem influences overall health and the risks it imposes  Reviewed plan for weight loss with lower calorie diet (via better food choices and also portion control or program like weight watchers) and exercise building up to or more than 30 minutes 5 days per week including some aerobic activity   Difficult due to mental impairment- family makes good effort       Prostate cancer screening    Lab Results  Component Value Date   PSA 7.62 (H) 08/29/2017   PSA 1.11 08/25/2016   PSA 0.95 08/18/2015   Ref to urology for inc psa

## 2017-09-06 NOTE — Assessment & Plan Note (Signed)
No change in status  Family takes good care of him

## 2017-09-06 NOTE — Assessment & Plan Note (Signed)
bp in fair control at this time  BP Readings from Last 1 Encounters:  09/05/17 116/72   No changes needed Disc lifstyle change with low sodium diet and exercise   Labs reviewed  Better lifestyle habits encouraged

## 2017-09-06 NOTE — Assessment & Plan Note (Signed)
Lab Results  Component Value Date   PSA 7.62 (H) 08/29/2017   PSA 1.11 08/25/2016   PSA 0.95 08/18/2015   Ref to urology for inc psa

## 2017-09-06 NOTE — Assessment & Plan Note (Signed)
Lab Results  Component Value Date   HGBA1C 6.8 (H) 08/29/2017   Unchanged disc imp of low glycemic diet and wt loss to prevent worsening  On ace Disc eye and foot care  Family will take him for eye exam  F/u 6 mo

## 2017-09-06 NOTE — Assessment & Plan Note (Signed)
Discussed how this problem influences overall health and the risks it imposes  Reviewed plan for weight loss with lower calorie diet (via better food choices and also portion control or program like weight watchers) and exercise building up to or more than 30 minutes 5 days per week including some aerobic activity   Difficult due to mental impairment- family makes good effort

## 2017-09-06 NOTE — Assessment & Plan Note (Signed)
Lab Results  Component Value Date   PSA 7.62 (H) 08/29/2017   PSA 1.11 08/25/2016   PSA 0.95 08/18/2015   No urinary symptoms or changes  Ref to urology  Unsure of family hx

## 2017-09-06 NOTE — Assessment & Plan Note (Signed)
This comes and goes Cbc ref  Wbc 2.9 Trig 414 No symptoms or current infection  Continue to follow

## 2017-09-06 NOTE — Assessment & Plan Note (Signed)
Disc goals for lipids and reasons to control them Rev labs with pt Rev low sat fat diet in detail LDL is down some at 102 Apprehensive to start statin in light of gemfibrozil Trig are well controlled

## 2017-09-28 ENCOUNTER — Telehealth: Payer: Self-pay | Admitting: *Deleted

## 2017-09-28 MED ORDER — LISINOPRIL 5 MG PO TABS: 2.5000 mg | ORAL_TABLET | Freq: Every day | ORAL | 3 refills | Status: DC

## 2017-09-28 NOTE — Telephone Encounter (Signed)
Received fax from [facility] saying "pt request new Rx: we have documented that pt is deceased but caregiver is stating he is not deceased. Please send new Rx for lisinopril 5mg  if appropriate"  Rx for lisinopril resent per fax request

## 2017-10-11 ENCOUNTER — Encounter: Payer: Self-pay | Admitting: Urology

## 2017-10-11 ENCOUNTER — Ambulatory Visit (INDEPENDENT_AMBULATORY_CARE_PROVIDER_SITE_OTHER): Payer: Medicare Other | Admitting: Urology

## 2017-10-11 VITALS — BP 145/70 | HR 73 | Ht 66.5 in | Wt 223.2 lb

## 2017-10-11 DIAGNOSIS — R972 Elevated prostate specific antigen [PSA]: Secondary | ICD-10-CM

## 2017-10-11 NOTE — Progress Notes (Signed)
10/11/2017 1:48 PM   Daniel Bailey 1951-01-04 [id number]  Referring provider: [name], MD [address] [place], [place] [address]  Chief Complaint  Patient presents with  . Elevated PSA    HPI: 67 year old male with intellectual disability referred for evaluation of an elevated PSA.  A PSA performed in April 2019 was elevated at 7.62.  Previous PSAs in April 2017 and 2018 were 0.95 and 1.11 respectively.  He has no bothersome lower urinary tract symptoms.  Denies dysuria or gross hematuria.  No previous history of urologic problems or prior urologic evaluation.   PMH: Past Medical History:  Diagnosis Date  . Glaucoma   . Hyperglycemia   . Hyperlipidemia   . Hypertension     Surgical History: History reviewed. No pertinent surgical history.  Home Medications:  Allergies as of 10/11/2017   No Known Allergies     Medication List        Accurate as of 10/11/17  1:48 PM. Always use your most recent med list.          gemfibrozil 600 MG tablet Commonly known as:  LOPID Take 1 tablet (600 mg total) by mouth 2 (two) times daily.   lisinopril 5 MG tablet Commonly known as:  PRINIVIL,ZESTRIL Take 0.5 tablets (2.5 mg total) by mouth daily.       Allergies: No Known Allergies  Family History: Family History  Problem Relation Age of Onset  . Colon cancer Mother     Social History:  reports that he has never smoked. He has never used smokeless tobacco. He reports that he does not drink alcohol or use drugs.  ROS: UROLOGY Frequent Urination?: No Hard to postpone urination?: No Burning/pain with urination?: No Get up at night to urinate?: Yes Leakage of urine?: No Urine stream starts and stops?: No Trouble starting stream?: No Do you have to strain to urinate?: No Blood in urine?: No Urinary tract infection?: No Sexually transmitted disease?: No Injury to kidneys or bladder?: No Painful intercourse?: No Weak stream?: No Erection problems?:  No Penile pain?: No  Gastrointestinal Nausea?: No Vomiting?: No Indigestion/heartburn?: No Diarrhea?: No Constipation?: No  Constitutional Fever: No Night sweats?: No Weight loss?: No Fatigue?: No  Skin Skin rash/lesions?: No Itching?: No  Eyes Blurred vision?: No Double vision?: No  Ears/Nose/Throat Sore throat?: No Sinus problems?: No  Hematologic/Lymphatic Swollen glands?: No Easy bruising?: No  Cardiovascular Leg swelling?: No Chest pain?: No  Respiratory Cough?: No Shortness of breath?: No  Endocrine Excessive thirst?: No  Musculoskeletal Back pain?: No Joint pain?: No  Neurological Headaches?: No Dizziness?: No  Psychologic Depression?: No Anxiety?: No  Physical Exam: BP (!) 145/70 (BP Location: Left Arm, Patient Position: Sitting, Cuff Size: Large)   Pulse 73   Ht 5' 6.5" (1.689 m)   Wt 223 lb 3.2 oz (101.2 kg)   SpO2 99%   BMI 35.49 kg/m   Constitutional:  Alert, No acute distress. HEENT: [place] AT, moist mucus membranes.  Trachea midline, no masses. Cardiovascular: No clubbing, cyanosis, or edema. Respiratory: Normal respiratory effort, no increased work of breathing. GI: Abdomen is soft, nontender, nondistended, no abdominal masses GU: No CVA tenderness.  Prostate 35 g, smooth without nodules. Lymph: No cervical or inguinal lymphadenopathy. Skin: No rashes, bruises or suspicious lesions. Neurologic: Grossly intact, no focal deficits, moving all 4 extremities. Psychiatric: Normal mood and affect.  Laboratory Data:  Lab Results  Component Value Date   PSA 7.62 (H) 08/29/2017  PSA 1.11 08/25/2016   PSA 0.95 08/18/2015     Assessment & Plan:   67 year old male with an elevated PSA which is significantly above his baseline.  DRE is benign.  I discussed potential causes with patient and his caregiver including prostate cancer and inflammation.  His sister who is his [facility] POA could not come to the appointment today.  Will repeat his  PSA today and if it remains elevated prostate biopsy was discussed.   Abbie Sons, [place] [address], [place],  [address] [phone]

## 2017-10-12 LAB — PSA: PROSTATE SPECIFIC AG, SERUM: 4.6 ng/mL — AB (ref 0.0–4.0)

## 2017-10-15 ENCOUNTER — Telehealth: Payer: Self-pay

## 2017-10-15 ENCOUNTER — Telehealth: Payer: Self-pay | Admitting: *Deleted

## 2017-10-15 NOTE — Telephone Encounter (Signed)
-----   Message from Abbie Sons, MD sent at 10/15/2017  8:41 AM EDT ----- PSA was better at 4.6 but still significantly elevated above baseline.  Recommend scheduling a prostate biopsy

## 2017-10-15 NOTE — Telephone Encounter (Signed)
Spoke with patient's caregiver [name], patient is MR. She was notified of results and given verbal instructions for the prostate biopsy. She is worried that the patient will not tolerate the procedure in the office but is willing to try. Please schedule prostate biopsy and mail a copy of instructions as well thanks

## 2017-10-15 NOTE — Telephone Encounter (Signed)
[name] had concerns about the pt needing a prostate biopsy, [name] concerned about the prep and procedure given his mental status. [name] would like Dr. [name] to call her to discuss this if possible. She would just like Dr. Marliss Coots input on situation

## 2017-10-16 NOTE — Telephone Encounter (Signed)
Left message for Daniel Bailey to call back to schedule biopsy for patient.  [name]

## 2017-10-16 NOTE — Telephone Encounter (Signed)
I spoke to her- the question being can they do any type of conscious sedation for a prostate biopsy since he is mentally challenged and this will be traumatic to him   I will cc this to Dr Elenor Quinones and also send a flag asking this question because I am not sure

## 2017-10-16 NOTE — Telephone Encounter (Signed)
I received a note back from Dr Elenor Quinones -please let [name] know that he does in fact plan to do Jeneen Rinks' biopsy under conscious sedation - she can call their office for more details   Thanks

## 2017-10-16 NOTE — Telephone Encounter (Signed)
[name] advised.

## 2017-10-25 ENCOUNTER — Telehealth: Payer: Self-pay | Admitting: Urology

## 2017-10-25 NOTE — Telephone Encounter (Signed)
I haven reached out to the patient's caregiver twice and she has not returned my call yet. I will not be able to schedule his biopsy until she calls back. [name]

## 2017-10-26 NOTE — Telephone Encounter (Signed)
noted 

## 2017-10-29 ENCOUNTER — Telehealth: Payer: Self-pay | Admitting: Urology

## 2017-10-29 NOTE — Telephone Encounter (Signed)
Patient's caregiver stated that he would not be able to tolerate a biopsy in the office and thought it would be better to do it in the OR. Please arrange for this to be done. Her name is [name] and she can be reached at [phone] Thanks, [name]

## 2017-11-01 ENCOUNTER — Other Ambulatory Visit: Payer: Self-pay | Admitting: Radiology

## 2017-11-01 DIAGNOSIS — R972 Elevated prostate specific antigen [PSA]: Secondary | ICD-10-CM

## 2017-11-01 NOTE — Telephone Encounter (Signed)
Discussed prostate biopsy to be done in OR with caregiver. Instructions given, questions answered. Caregiver, [name], voices understanding.

## 2017-11-02 ENCOUNTER — Other Ambulatory Visit: Payer: Self-pay | Admitting: Urology

## 2017-11-02 DIAGNOSIS — R972 Elevated prostate specific antigen [PSA]: Secondary | ICD-10-CM

## 2017-11-05 ENCOUNTER — Telehealth: Payer: Self-pay | Admitting: Radiology

## 2017-11-05 NOTE — Telephone Encounter (Signed)
LMOM to notify caregiver, [name], of pre-admit testing appointment scheduled 11/13/2017 @2 :15.

## 2017-11-06 NOTE — Telephone Encounter (Signed)
Confirmed pre-admit testing & post op appointments with caregiver, [name]. Caregiver voices understanding with no questions at this time.

## 2017-11-13 ENCOUNTER — Encounter
Admission: RE | Admit: 2017-11-13 | Discharge: 2017-11-13 | Disposition: A | Discharge status: Home / Self Care | Payer: Medicare Other | Source: Ambulatory Visit | Attending: Urology | Admitting: Urology

## 2017-11-13 ENCOUNTER — Other Ambulatory Visit: Payer: Self-pay

## 2017-11-13 DIAGNOSIS — R972 Elevated prostate specific antigen [PSA]: Secondary | ICD-10-CM | POA: Diagnosis not present

## 2017-11-13 DIAGNOSIS — Z01812 Encounter for preprocedural laboratory examination: Secondary | ICD-10-CM | POA: Insufficient documentation

## 2017-11-13 MED ORDER — FLEET ENEMA 7-19 GM/118ML RE ENEM
1.0000 | ENEMA | Freq: Once | RECTAL | Status: DC
  Filled 2017-11-13: qty 1

## 2017-11-13 NOTE — Patient Instructions (Signed)
Your procedure is scheduled on: Tues 7/16 Report to Day Surgery.( 2nd floor in [place]) To find out your arrival time please call [phone] between [place] on Mon 7/15.  Remember: Instructions that are not followed completely may result in serious medical risk,  up to and including death, or upon the discretion of your surgeon and anesthesiologist your  surgery may need to be rescheduled.     _X__ 1. Do not eat food after midnight the night before your procedure.                 No gum chewing or hard candies. You may drink clear liquids up to 2 hours                 before you are scheduled to arrive for your surgery- DO not drink clear                 liquids within 2 hours of the start of your surgery.                 Clear Liquids include:  water, apple juice without pulp, clear carbohydrate                 drink such as Clearfast of Gatorade, Black Coffee or Tea (Do not add                 anything to coffee or tea).  __X__2.  On the morning of surgery brush your teeth with toothpaste and water, you                may rinse your mouth with mouthwash if you wish.  Do not swallow any toothpaste of mouthwash.     _X__ 3.  No Alcohol for 24 hours before or after surgery.   ___ 4.  Do Not Smoke or use e-cigarettes For 24 Hours Prior to Your Surgery.                 Do not use any chewable tobacco products for at least 6 hours prior to                 surgery.  ____  5.  Bring all medications with you on the day of surgery if instructed.   _x___  6.  Notify your doctor if there is any change in your medical condition      (cold, fever, infections).     Do not wear jewelry, make-up, hairpins, clips or nail polish. Do not wear lotions, powders, or perfumes. You may wear deodorant. Do not shave 48 hours prior to surgery. Men may shave face and neck. Do not bring valuables to the hospital.    [facility] is not responsible for any belongings or  valuables.  Contacts, dentures or bridgework may not be worn into surgery. Leave your suitcase in the car. After surgery it may be brought to your room. For patients admitted to the hospital, discharge time is determined by your treatment team.   Patients discharged the day of surgery will not be allowed to drive home.   Please read over the following fact sheets that you were given:    ____ Take these medicines the morning of surgery with A SIP OF WATER:    1. none  2.   3.   4.  5.  6.  ____ Fleet Enema (as directed)   ____ Use CHG Soap as directed  ____ Use  inhalers on the day of surgery  ____ Stop metformin 2 days prior to surgery    ____ Take 1/2 of usual insulin dose the night before surgery. No insulin the morning          of surgery.   ____ Stop Coumadin/Plavix/aspirin on   ____ Stop Anti-inflammatories on    ____ Stop supplements until after surgery.    ____ Bring C-Pap to the hospital.    Dulcolax tablets 5mg  2 tabs at noon on Mon. and 2 at 9:00 PM.  May use fleets if needed

## 2017-11-15 LAB — URINE CULTURE: Culture: >=100000 — AB

## 2017-11-16 ENCOUNTER — Telehealth: Payer: Self-pay | Admitting: Radiology

## 2017-11-16 ENCOUNTER — Other Ambulatory Visit: Payer: Self-pay | Admitting: Urology

## 2017-11-16 MED ORDER — SULFAMETHOXAZOLE-TRIMETHOPRIM 800-160 MG PO TABS
1.0000 | ORAL_TABLET | Freq: Two times a day (BID) | ORAL | 0 refills | Status: DC
Start: 2017-11-16 — End: 2018-03-13

## 2017-11-16 NOTE — Telephone Encounter (Signed)
-----   Message from Abbie Sons, MD sent at 11/16/2017 10:17 AM EDT ----- Rx Sarina Ill DS sent to pharmacy

## 2017-11-16 NOTE — Telephone Encounter (Signed)
[facility] notifying caregiver of script sent to pharmacy.

## 2017-11-19 ENCOUNTER — Other Ambulatory Visit: Payer: Self-pay | Admitting: Student

## 2017-11-19 MED ORDER — SODIUM CHLORIDE 0.9 % IV SOLN
1.0000 g | INTRAVENOUS | Status: AC
  Administered 2017-11-20: 1 g via INTRAVENOUS
  Filled 2017-11-19: qty 1

## 2017-11-20 ENCOUNTER — Encounter: Payer: Self-pay | Admitting: *Deleted

## 2017-11-20 ENCOUNTER — Other Ambulatory Visit: Payer: Self-pay

## 2017-11-20 ENCOUNTER — Ambulatory Visit
Admission: RE | Admit: 2017-11-20 | Discharge: 2017-11-20 | Disposition: A | Discharge status: Home / Self Care | Payer: Medicare Other | Source: Ambulatory Visit | Attending: Urology | Admitting: Urology

## 2017-11-20 ENCOUNTER — Ambulatory Visit: Payer: Medicare Other | Admitting: Anesthesiology

## 2017-11-20 ENCOUNTER — Encounter
Admission: RE | Disposition: A | Discharge status: Home / Self Care | Payer: Self-pay | Source: Ambulatory Visit | Attending: Urology

## 2017-11-20 DIAGNOSIS — N400 Enlarged prostate without lower urinary tract symptoms: Secondary | ICD-10-CM | POA: Diagnosis not present

## 2017-11-20 DIAGNOSIS — E119 Type 2 diabetes mellitus without complications: Secondary | ICD-10-CM | POA: Diagnosis not present

## 2017-11-20 DIAGNOSIS — I10 Essential (primary) hypertension: Secondary | ICD-10-CM | POA: Insufficient documentation

## 2017-11-20 DIAGNOSIS — R972 Elevated prostate specific antigen [PSA]: Secondary | ICD-10-CM

## 2017-11-20 DIAGNOSIS — E785 Hyperlipidemia, unspecified: Secondary | ICD-10-CM | POA: Diagnosis not present

## 2017-11-20 DIAGNOSIS — Z79899 Other long term (current) drug therapy: Secondary | ICD-10-CM | POA: Diagnosis not present

## 2017-11-20 DIAGNOSIS — N411 Chronic prostatitis: Secondary | ICD-10-CM | POA: Diagnosis not present

## 2017-11-20 HISTORY — PX: PROSTATE BIOPSY: SHX241

## 2017-11-20 SURGERY — BIOPSY, PROSTATE, RECTAL APPROACH, WITH US GUIDANCE
Anesthesia: General | Site: Rectum | Wound class: Dirty or Infected

## 2017-11-20 MED ORDER — MIDAZOLAM HCL 2 MG/2ML IJ SOLN
INTRAMUSCULAR | Status: AC
  Filled 2017-11-20: qty 2

## 2017-11-20 MED ORDER — FAMOTIDINE 20 MG PO TABS
ORAL_TABLET | ORAL | Status: AC
  Administered 2017-11-20: 20 mg via ORAL
  Filled 2017-11-20: qty 1

## 2017-11-20 MED ORDER — SODIUM CHLORIDE 0.9 % IV SOLN
INTRAVENOUS | Status: DC
  Administered 2017-11-20: 07:00:00 via INTRAVENOUS

## 2017-11-20 MED ORDER — PROPOFOL 10 MG/ML IV BOLUS
INTRAVENOUS | Status: AC
  Filled 2017-11-20: qty 40

## 2017-11-20 MED ORDER — LIDOCAINE HCL (CARDIAC) PF 100 MG/5ML IV SOSY
PREFILLED_SYRINGE | INTRAVENOUS | Status: DC | PRN
  Administered 2017-11-20: 80 mg via INTRAVENOUS

## 2017-11-20 MED ORDER — MIDAZOLAM HCL 2 MG/2ML IJ SOLN
INTRAMUSCULAR | Status: DC | PRN
  Administered 2017-11-20: 2 mg via INTRAVENOUS

## 2017-11-20 MED ORDER — FENTANYL CITRATE (PF) 100 MCG/2ML IJ SOLN: 25.0000 ug | INTRAMUSCULAR | Status: DC | PRN

## 2017-11-20 MED ORDER — PROPOFOL 500 MG/50ML IV EMUL
INTRAVENOUS | Status: DC | PRN
  Administered 2017-11-20: 100 ug/kg/min via INTRAVENOUS

## 2017-11-20 MED ORDER — ONDANSETRON HCL 4 MG/2ML IJ SOLN
INTRAMUSCULAR | Status: DC | PRN
  Administered 2017-11-20: 4 mg via INTRAVENOUS

## 2017-11-20 MED ORDER — FENTANYL CITRATE (PF) 100 MCG/2ML IJ SOLN
INTRAMUSCULAR | Status: AC
  Filled 2017-11-20: qty 2

## 2017-11-20 MED ORDER — FENTANYL CITRATE (PF) 100 MCG/2ML IJ SOLN
INTRAMUSCULAR | Status: DC | PRN
  Administered 2017-11-20: 50 ug via INTRAVENOUS
  Administered 2017-11-20: 25 ug via INTRAVENOUS

## 2017-11-20 MED ORDER — FAMOTIDINE 20 MG PO TABS
20.0000 mg | ORAL_TABLET | Freq: Once | ORAL | Status: AC
  Administered 2017-11-20: 20 mg via ORAL

## 2017-11-20 SURGICAL SUPPLY — 16 items
DRAPE SHEET LG 3/4 BI-LAMINATE (DRAPES) ×3 IMPLANT
DRSG TELFA 3X8 NADH (GAUZE/BANDAGES/DRESSINGS) ×3 IMPLANT
DRSG TELFA 4X3 1S NADH ST (GAUZE/BANDAGES/DRESSINGS) ×3 IMPLANT
GAUZE SPONGE 4X4 12PLY STRL (GAUZE/BANDAGES/DRESSINGS) ×3 IMPLANT
GLOVE BIO SURGEON STRL SZ8 (GLOVE) ×3 IMPLANT
GOWN STRL REUS W/ TWL XL LVL3 (GOWN DISPOSABLE) ×1 IMPLANT
GOWN STRL REUS W/TWL XL LVL3 (GOWN DISPOSABLE) ×3
GUIDE NDL ENDOCAV 16-18 CVR (NEEDLE) ×1 IMPLANT
GUIDE NEEDLE ENDOCAV 16-18 CVR (NEEDLE) ×3 IMPLANT
INST BIOPSY MAXCORE 18GX25 (NEEDLE) ×3 IMPLANT
KIT TURNOVER CYSTO (KITS) ×3 IMPLANT
NDL GUIDE BIOPSY 644068 (NEEDLE) ×1 IMPLANT
NEEDLE GUIDE BIOPSY 644068 (NEEDLE) ×3 IMPLANT
PAD DRESSING TELFA 3X8 NADH (GAUZE/BANDAGES/DRESSINGS) ×1 IMPLANT
SYR 10ML LL (SYRINGE) ×3 IMPLANT
WATER STERILE IRR 1000ML POUR (IV SOLUTION) ×3 IMPLANT

## 2017-11-20 NOTE — Transfer of Care (Signed)
Immediate Anesthesia Transfer of Care Note  Patient: Daniel Bailey  Procedure(s) Performed: BIOPSY TRANSRECTAL ULTRASONIC PROSTATE (TUBP) (N/A Rectum)  Patient Location: PACU  Anesthesia Type:General  Level of Consciousness: sedated  Airway & Oxygen Therapy: Patient Spontanous Breathing and Patient connected to face mask oxygen  Post-op Assessment: Report given to RN and Post -op Vital signs reviewed and stable  Post vital signs: Reviewed and stable  Last Vitals:  Vitals Value Taken Time  BP 91/64 11/20/2017  8:08 AM  Temp 36 C 11/20/2017  8:08 AM  Pulse 57 11/20/2017  8:11 AM  Resp 10 11/20/2017  8:11 AM  SpO2 99 % 11/20/2017  8:11 AM  Vitals shown include unvalidated device data.  Last Pain:  Vitals:   11/20/17 0808  TempSrc:   PainSc: Asleep         Complications: No apparent anesthesia complications

## 2017-11-20 NOTE — Anesthesia Postprocedure Evaluation (Signed)
Anesthesia Post Note  Patient: Daniel Bailey  Procedure(s) Performed: BIOPSY TRANSRECTAL ULTRASONIC PROSTATE (TUBP) (N/A Rectum)  Patient location during evaluation: PACU Anesthesia Type: General Level of consciousness: awake and alert Pain management: pain level controlled Vital Signs Assessment: post-procedure vital signs reviewed and stable Respiratory status: spontaneous breathing, nonlabored ventilation and respiratory function stable Cardiovascular status: blood pressure returned to baseline and stable Postop Assessment: no apparent nausea or vomiting Anesthetic complications: no     Last Vitals:  Vitals:   11/20/17 0902 11/20/17 0909  BP: 120/66 126/69  Pulse: (!) 55 (!) 58  Resp: 16 16  Temp: (!) 36.2 C   SpO2: 99% 99%    Last Pain:  Vitals:   11/20/17 0902  TempSrc:   PainSc: 0-No pain                 Durenda Hurt

## 2017-11-20 NOTE — Anesthesia Preprocedure Evaluation (Addendum)
Anesthesia Evaluation  Patient identified by MRN, date of birth, ID band Patient awake    Reviewed: Allergy & Precautions, H&P , NPO status , Patient's Chart, lab work & pertinent test results  Airway Mallampati: III  TM Distance: >3 FB Neck ROM: full    Dental  (+)    Pulmonary neg pulmonary ROS,    breath sounds clear to auscultation       Cardiovascular hypertension, negative cardio ROS   Rhythm:Regular Rate:Normal     Neuro/Psych PSYCHIATRIC DISORDERS negative neurological ROS  negative psych ROS   GI/Hepatic negative GI ROS, Neg liver ROS,   Endo/Other  negative endocrine ROSdiabetes, Type 2  Renal/GU negative Renal ROS  negative genitourinary   Musculoskeletal   Abdominal   Peds  Hematology negative hematology ROS (+)   Anesthesia Other Findings Past Medical History: No date: Glaucoma No date: Hyperglycemia No date: Hyperlipidemia No date: Hypertension  Past Surgical History: No date: COLONOSCOPY  BMI    Body Mass Index:  30.34 kg/m      Reproductive/Obstetrics negative OB ROS                            Anesthesia Physical Anesthesia Plan  ASA: III  Anesthesia Plan: General   Post-op Pain Management:    Induction:   PONV Risk Score and Plan: Propofol infusion  Airway Management Planned:   Additional Equipment:   Intra-op Plan:   Post-operative Plan:   Informed Consent: I have reviewed the patients History and Physical, chart, labs and discussed the procedure including the risks, benefits and alternatives for the proposed anesthesia with the patient or authorized representative who has indicated his/her understanding and acceptance.   Dental Advisory Given  Plan Discussed with: Anesthesiologist, CRNA and Surgeon  Anesthesia Plan Comments:        Anesthesia Quick Evaluation

## 2017-11-20 NOTE — Op Note (Signed)
Preoperative diagnosis:  1. Elevated PSA  Postoperative diagnosis:  1. Elevated PSA  Procedure: 1. Transrectal ultrasound prostate 2. Transrectal prostate biopsies  Surgeon: Abbie Sons, MD  Anesthesia: MAC  Complications: None  Intraoperative findings: Prostate volume 22 cc.  No echogenic abnormalities identified  EBL: Minimal  Specimens: Needle biopsies prostate  Indication: Daniel Bailey is a 67 y.o. intellectually disabled patient with a PSA of 7.62.  It was felt he could not tolerate an office prostate biopsy.  After reviewing the management options for treatment, he elected to proceed with the above surgical procedure(s). We have discussed the potential benefits and risks of the procedure, side effects of the proposed treatment, the likelihood of the patient achieving the goals of the procedure, and any potential problems that might occur during the procedure or recuperation. Informed consent has been obtained.  Description of procedure:  The patient was taken to the operating room and general anesthesia was induced.  The patient was kept on the stretcher and placed in the left lateral decubitus position.  Preoperative antibiotics were administered. A preoperative time-out was performed.   Digital rectal examination was performed and the prostate was small without palpable abnormalities.  A 7.5 MHz transrectal ultrasound probe was lubricated and inserted per rectum.  Transrectal ultrasound was performed with findings as described above.  Standard 12 core biopsies were performed without difficulty.  No bleeding was noted.  The patient was then placed in supine position and transported to the PACU in stable condition.  Abbie Sons, M.D.

## 2017-11-20 NOTE — Interval H&P Note (Signed)
History and Physical Interval Note:  11/20/2017 7:42 AM  Daniel Bailey  has presented today for surgery, with the diagnosis of elevated PSA  The various methods of treatment have been discussed with the patient and family. After consideration of risks, benefits and other options for treatment, the patient has consented to  Procedure(s): BIOPSY TRANSRECTAL ULTRASONIC PROSTATE (TUBP) (N/A) as a surgical intervention .  The patient's history has been reviewed, patient examined, no change in status, stable for surgery.  I have reviewed the patient's chart and labs.  Questions were answered to the patient's satisfaction.     [place]

## 2017-11-20 NOTE — H&P (Signed)
11/20/2017  Daniel Bailey 1951-01-01 [id number]  Referring provider: [name], MD [address] [place], [place] [address]     Chief Complaint  Patient presents with  . Elevated PSA    HPI: 67 year old male with intellectual disability referred for evaluation of an elevated PSA.  A PSA performed in April 2019 was elevated at 7.62.  Previous PSAs in April 2017 and 2018 were 0.95 and 1.11 respectively.  He has no bothersome lower urinary tract symptoms.  Denies dysuria or gross hematuria.  No previous history of urologic problems or prior urologic evaluation.   PMH:     Past Medical History:  Diagnosis Date  . Glaucoma   . Hyperglycemia   . Hyperlipidemia   . Hypertension     Surgical History: History reviewed. No pertinent surgical history.  Home Medications:  Allergies as of 10/11/2017   No Known Allergies              Medication List            Accurate as of 10/11/17  1:48 PM. Always use your most recent med list.           gemfibrozil 600 MG tablet Commonly known as:  LOPID Take 1 tablet (600 mg total) by mouth 2 (two) times daily.   lisinopril 5 MG tablet Commonly known as:  PRINIVIL,ZESTRIL Take 0.5 tablets (2.5 mg total) by mouth daily.       Allergies: No Known Allergies  Family History:      Family History  Problem Relation Age of Onset  . Colon cancer Mother     Social History:  reports that he has never smoked. He has never used smokeless tobacco. He reports that he does not drink alcohol or use drugs.  ROS: UROLOGY Frequent Urination?: No Hard to postpone urination?: No Burning/pain with urination?: No Get up at night to urinate?: Yes Leakage of urine?: No Urine stream starts and stops?: No Trouble starting stream?: No Do you have to strain to urinate?: No Blood in urine?: No Urinary tract infection?: No Sexually transmitted disease?: No Injury to kidneys or bladder?: No Painful intercourse?:  No Weak stream?: No Erection problems?: No Penile pain?: No  Gastrointestinal Nausea?: No Vomiting?: No Indigestion/heartburn?: No Diarrhea?: No Constipation?: No  Constitutional Fever: No Night sweats?: No Weight loss?: No Fatigue?: No  Skin Skin rash/lesions?: No Itching?: No  Eyes Blurred vision?: No Double vision?: No  Ears/Nose/Throat Sore throat?: No Sinus problems?: No  Hematologic/Lymphatic Swollen glands?: No Easy bruising?: No  Cardiovascular Leg swelling?: No Chest pain?: No  Respiratory Cough?: No Shortness of breath?: No  Endocrine Excessive thirst?: No  Musculoskeletal Back pain?: No Joint pain?: No  Neurological Headaches?: No Dizziness?: No  Psychologic Depression?: No Anxiety?: No  Physical Exam: BP (!) 145/70 (BP Location: Left Arm, Patient Position: Sitting, Cuff Size: Large)   Pulse 73   Ht 5' 6.5" (1.689 m)   Wt 223 lb 3.2 oz (101.2 kg)   SpO2 99%   BMI 35.49 kg/m   Constitutional:  Alert, No acute distress. HEENT: [place] AT, moist mucus membranes.  Trachea midline, no masses. Cardiovascular: No clubbing, cyanosis, or edema. RRR Respiratory: Normal respiratory effort, no increased work of breathing. Lungs clear GI: Abdomen is soft, nontender, nondistended, no abdominal masses GU: No CVA tenderness.  Prostate 35 g, smooth without nodules. Lymph: No cervical or inguinal lymphadenopathy. Skin: No rashes, bruises or suspicious lesions. Neurologic: Grossly intact, no focal deficits, moving all  4 extremities. Psychiatric: Normal mood and affect.  Laboratory Data:  RecentLabs       Lab Results  Component Value Date   PSA 7.62 (H) 08/29/2017   PSA 1.11 08/25/2016   PSA 0.95 08/18/2015       Assessment & Plan:   67 year old male with an elevated PSA which is significantly above his baseline.  he presents for TRUS/prostate biopsy.  The procedure has been discussed with his [place],  [place] [address], [place], [place] [address] [phone]

## 2017-11-20 NOTE — OR Nursing (Signed)
discharge instructions discussed with pt and sister. Both voice understanding.

## 2017-11-20 NOTE — Anesthesia Post-op Follow-up Note (Signed)
Anesthesia QCDR form completed.        

## 2017-11-20 NOTE — Discharge Instructions (Signed)

## 2017-11-22 LAB — SURGICAL PATHOLOGY

## 2017-11-23 ENCOUNTER — Telehealth: Payer: Self-pay

## 2017-11-23 NOTE — Telephone Encounter (Signed)
[name] informed

## 2017-11-23 NOTE — Telephone Encounter (Signed)
-----   Message from Abbie Sons, MD sent at 11/23/2017  1:00 PM EDT ----- Prostate biopsy showed no evidence of cancer.  Patient is intellectually disabled and can notify his [facility] POA.  If he does not have any post biopsy problems would recommend rechecking his PSA in 6 months and cancel the follow-up visit for results.

## 2017-11-23 NOTE — Telephone Encounter (Signed)
Left message for [name].

## 2017-11-27 ENCOUNTER — Ambulatory Visit: Payer: Medicare Other | Admitting: Urology

## 2017-12-13 DIAGNOSIS — B351 Tinea unguium: Secondary | ICD-10-CM | POA: Diagnosis not present

## 2017-12-13 DIAGNOSIS — M79675 Pain in left toe(s): Secondary | ICD-10-CM | POA: Diagnosis not present

## 2017-12-13 DIAGNOSIS — M79674 Pain in right toe(s): Secondary | ICD-10-CM | POA: Diagnosis not present

## 2018-03-07 ENCOUNTER — Telehealth: Payer: Self-pay | Admitting: Family Medicine

## 2018-03-07 DIAGNOSIS — I10 Essential (primary) hypertension: Secondary | ICD-10-CM

## 2018-03-07 DIAGNOSIS — E781 Pure hyperglyceridemia: Secondary | ICD-10-CM

## 2018-03-07 DIAGNOSIS — E119 Type 2 diabetes mellitus without complications: Secondary | ICD-10-CM

## 2018-03-07 DIAGNOSIS — D709 Neutropenia, unspecified: Secondary | ICD-10-CM

## 2018-03-07 NOTE — Telephone Encounter (Signed)
-----   Message from Lendon Collar, RT sent at 02/26/2018 10:00 AM EDT ----- Regarding: Lab orders for Friday Nov 1st Please enter lab orders for 39month follow up. Thanks!

## 2018-03-08 ENCOUNTER — Other Ambulatory Visit (INDEPENDENT_AMBULATORY_CARE_PROVIDER_SITE_OTHER): Payer: Medicare Other

## 2018-03-08 DIAGNOSIS — I10 Essential (primary) hypertension: Secondary | ICD-10-CM | POA: Diagnosis not present

## 2018-03-08 DIAGNOSIS — D709 Neutropenia, unspecified: Secondary | ICD-10-CM

## 2018-03-08 DIAGNOSIS — E119 Type 2 diabetes mellitus without complications: Secondary | ICD-10-CM | POA: Diagnosis not present

## 2018-03-08 DIAGNOSIS — E781 Pure hyperglyceridemia: Secondary | ICD-10-CM | POA: Diagnosis not present

## 2018-03-08 LAB — COMPREHENSIVE METABOLIC PANEL
ALBUMIN: 4.3 g/dL (ref 3.5–5.2)
ALT: 15 U/L (ref 0–53)
AST: 20 U/L (ref 0–37)
Alkaline Phosphatase: 58 U/L (ref 39–117)
BILIRUBIN TOTAL: 0.5 mg/dL (ref 0.2–1.2)
BUN: 17 mg/dL (ref 6–23)
CALCIUM: 9.3 mg/dL (ref 8.4–10.5)
CO2: 27 mEq/L (ref 19–32)
CREATININE: 1.16 mg/dL (ref 0.40–1.50)
Chloride: 101 mEq/L (ref 96–112)
GFR: 80.48 mL/min (ref >60.00)
Glucose, Bld: 143 mg/dL — ABNORMAL HIGH (ref 70–99)
Potassium: 4.1 mEq/L (ref 3.5–5.1)
Sodium: 136 mEq/L (ref 135–145)
Total Protein: 7.5 g/dL (ref 6.0–8.3)

## 2018-03-08 LAB — CBC WITH DIFFERENTIAL/PLATELET
BASOS ABS: 0 10*3/uL (ref 0.0–0.1)
Basophils Relative: 1.1 % (ref 0.0–3.0)
EOS ABS: 0.1 10*3/uL (ref 0.0–0.7)
Eosinophils Relative: 1.9 % (ref 0.0–5.0)
HEMATOCRIT: 38.9 % — AB (ref 39.0–52.0)
HEMOGLOBIN: 13.2 g/dL (ref 13.0–17.0)
LYMPHS PCT: 45 % (ref 12.0–46.0)
Lymphs Abs: 1.9 10*3/uL (ref 0.7–4.0)
MCHC: 34 g/dL (ref 30.0–36.0)
MCV: 88.1 fl (ref 78.0–100.0)
Monocytes Absolute: 0.4 10*3/uL (ref 0.1–1.0)
Monocytes Relative: 8.6 % (ref 3.0–12.0)
Neutro Abs: 1.8 10*3/uL (ref 1.4–7.7)
Neutrophils Relative %: 43.4 % (ref 43.0–77.0)
Platelets: 343 10*3/uL (ref 150.0–400.0)
RBC: 4.41 Mil/uL (ref 4.22–5.81)
RDW: 14.3 % (ref 11.5–15.5)
WBC: 4.2 10*3/uL (ref 4.0–10.5)

## 2018-03-08 LAB — LIPID PANEL
CHOL/HDL RATIO: 5
Cholesterol: 178 mg/dL (ref 0–200)
HDL: 35.8 mg/dL — AB (ref >39.00)
LDL Cholesterol: 115 mg/dL — ABNORMAL HIGH (ref 0–99)
NonHDL: 141.82
TRIGLYCERIDES: 134 mg/dL (ref 0.0–149.0)
VLDL: 26.8 mg/dL (ref 0.0–40.0)

## 2018-03-08 LAB — HEMOGLOBIN A1C: Hgb A1c MFr Bld: 6.7 % — ABNORMAL HIGH (ref 4.6–6.5)

## 2018-03-13 ENCOUNTER — Encounter: Payer: Self-pay | Admitting: Family Medicine

## 2018-03-13 ENCOUNTER — Ambulatory Visit: Payer: Medicare Other | Admitting: Family Medicine

## 2018-03-13 ENCOUNTER — Ambulatory Visit (INDEPENDENT_AMBULATORY_CARE_PROVIDER_SITE_OTHER): Payer: Medicare Other | Admitting: Family Medicine

## 2018-03-13 VITALS — BP 124/68 | HR 78 | Temp 97.9°F | Ht 66.5 in | Wt 237.5 lb

## 2018-03-13 DIAGNOSIS — D709 Neutropenia, unspecified: Secondary | ICD-10-CM

## 2018-03-13 DIAGNOSIS — Z23 Encounter for immunization: Secondary | ICD-10-CM | POA: Diagnosis not present

## 2018-03-13 DIAGNOSIS — E119 Type 2 diabetes mellitus without complications: Secondary | ICD-10-CM | POA: Diagnosis not present

## 2018-03-13 DIAGNOSIS — E781 Pure hyperglyceridemia: Secondary | ICD-10-CM | POA: Diagnosis not present

## 2018-03-13 DIAGNOSIS — I10 Essential (primary) hypertension: Secondary | ICD-10-CM

## 2018-03-13 DIAGNOSIS — Z6837 Body mass index (BMI) 37.0-37.9, adult: Secondary | ICD-10-CM | POA: Diagnosis not present

## 2018-03-13 DIAGNOSIS — E6609 Other obesity due to excess calories: Secondary | ICD-10-CM

## 2018-03-13 MED ORDER — ATORVASTATIN CALCIUM 10 MG PO TABS: 10.0000 mg | ORAL_TABLET | Freq: Every day | ORAL | 3 refills | Status: DC

## 2018-03-13 NOTE — Assessment & Plan Note (Signed)
bp in fair control at this time  BP Readings from Last 1 Encounters:  03/13/18 124/68   No changes needed Most recent labs reviewed  Disc lifstyle change with low sodium diet and exercise

## 2018-03-13 NOTE — Assessment & Plan Note (Signed)
Disc goals for lipids and reasons to control them Rev last labs with pt Rev low sat fat diet in detail Will add statin for LDL , in light of DM2 Atorvastatin 10 mg daily  Alert if side eff (esp in light of gemfibrozil) -aches or pains  Lab 6 wk

## 2018-03-13 NOTE — Assessment & Plan Note (Signed)
Gain of 20 lb since last visit   Discussed how this problem influences overall health and the risks it imposes  Reviewed plan for weight loss with lower calorie diet (via better food choices and also portion control or program like weight watchers) and exercise building up to or more than 30 minutes 5 days per week including some aerobic activity   Family will initiate daily walk Smaller portions Less processed carbs F/u 6 mo

## 2018-03-13 NOTE — Assessment & Plan Note (Signed)
Fairly stable despite wt gain Lab Results  Component Value Date   HGBA1C 6.7 (H) 03/08/2018   Disc diet -needs to snack less Also get back to walking 1 mile per day  On ace  Will schedule eye exam  F/u 6 mo

## 2018-03-13 NOTE — Patient Instructions (Addendum)
Try to get most of your carbohydrates from produce (with the exception of white potatoes)  Eat less bread/pasta/rice/snack foods/cereals/sweets and other items from the middle of the grocery store (processed carbs)   Please schedule a diabetic eye exam   We will start a small dose of lipitor 10 mg once daily (if any side effects like muscle aches and pains -let us know and hold the medication) Avoid red meat/ fried foods/ egg yolks/ fatty breakfast meats/ butter, cheese and high fat dairy/ and shellfish    Labs in 6 weeks   Diabetes is stable but weight is up  Walking is a great plan-three times per day  Also keep snack foods out of the house as much as possible Cut back portions as well (by 1/4 to 1/3 for weight loss)   Annual exam in 6 months   Flu shot today

## 2018-03-13 NOTE — Progress Notes (Signed)
Subjective:    Patient ID: Daniel Bailey, male    DOB: [birth year], 67 y.o.   MRN: [id number]  HPI Here for f/u of chronic health problems   Doing well  Started walking more outdoors this past week    Wt Readings from Last 3 Encounters:  03/13/18 237 lb 8 oz (107.7 kg)  11/20/17 217 lb 8 oz (98.7 kg)  11/13/17 217 lb 8 oz (98.7 kg)  was not active enough (now walking more this week)  Eating more crackers- finishing a box at a time  Needs to cut back on  37.76 kg/m   No ankle swelling   Flu shot -given today   bp is stable today  No cp or palpitations or headaches or edema  No side effects to medicines  BP Readings from Last 3 Encounters:  03/13/18 124/68  11/20/17 126/69  11/13/17 127/74     Good control  Takes lisinopril 5 mg   Lab Results  Component Value Date   CREATININE 1.16 03/08/2018   BUN 17 03/08/2018   NA 136 03/08/2018   K 4.1 03/08/2018   CL 101 03/08/2018   CO2 27 03/08/2018     Diabetes Home sugar results  DM diet - not as good as it should be  Exercise -started 2 days ago  Symptoms-none  A1C last  Lab Results  Component Value Date   HGBA1C 6.7 (H) 03/08/2018  controlled with diet  Down from 6.8  No problems with medications (on ace)  Renal protection-ace  Last eye exam  -needs one   Cholesterol with hx of elevated trig Lab Results  Component Value Date   CHOL 178 03/08/2018   CHOL 165 08/29/2017   CHOL 187 02/23/2017   Lab Results  Component Value Date   HDL 35.80 (L) 03/08/2018   HDL 39.10 08/29/2017   HDL 38.10 (L) 02/23/2017   Lab Results  Component Value Date   LDLCALC 115 (H) 03/08/2018   LDLCALC 102 (H) 08/29/2017   LDLCALC 126 (H) 02/23/2017   Lab Results  Component Value Date   TRIG 134.0 03/08/2018   TRIG 115.0 08/29/2017   TRIG 114.0 02/23/2017   Lab Results  Component Value Date   CHOLHDL 5 03/08/2018   CHOLHDL 4 08/29/2017   CHOLHDL 5 02/23/2017   Lab Results  Component Value Date   LDLDIRECT 175.3  08/16/2009   Takes gemfibrozil  Not on a statin - ok with starting one Diet could be better   Hx of low wbc Fine today  Lab Results  Component Value Date   WBC 4.2 03/08/2018   HGB 13.2 03/08/2018   HCT 38.9 (L) 03/08/2018   MCV 88.1 03/08/2018   PLT 343.0 03/08/2018   Patient Active Problem List   Diagnosis Date Noted  . Neutropenia ([place]) 09/05/2017  . Elevated PSA 09/05/2017  . 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 Hypertriglyceridemia, essential 02/19/2014  . Encounter for Medicare annual wellness exam 08/27/2012  . Obesity 08/18/2011  . Colon cancer screening 08/18/2011  . Routine general medical examination at a health care facility 08/10/2011  . Prostate cancer screening 08/10/2011  . Controlled type 2 diabetes mellitus without complication, without long-term current use of insulin ([place]) 05/30/2010  . HYPERTENSION, BENIGN ESSENTIAL 12/10/2006  . MENTAL RETARDATION 11/23/2006   Past Medical History:  Diagnosis Date  . Glaucoma   . Hyperglycemia   . Hyperlipidemia   . Hypertension    Past Surgical History:  Procedure Laterality Date  . COLONOSCOPY    .  PROSTATE BIOPSY N/A 11/20/2017   Procedure: BIOPSY TRANSRECTAL ULTRASONIC PROSTATE (TUBP);  Surgeon: Abbie Sons, MD;  Location: [facility] ORS;  Service: Urology;  Laterality: N/A;   Social History   Tobacco Use  . Smoking status: Never Smoker  . Smokeless tobacco: Never Used  Substance Use Topics  . Alcohol use: No    Alcohol/week: 0.0 standard drinks  . Drug use: No   Family History  Problem Relation Age of Onset  . Colon cancer Mother    No Known Allergies Current Outpatient Medications on File Prior to Visit  Medication Sig Dispense Refill  . acetaminophen (TYLENOL) 500 MG tablet Take 1,000 mg by mouth daily as needed for headache.    Marland Kitchen gemfibrozil (LOPID) 600 MG tablet Take 1 tablet (600 mg total) by mouth 2 (two) times daily. 180 tablet 3  . lisinopril (PRINIVIL,ZESTRIL) 5 MG tablet Take 0.5 tablets (2.5  mg total) by mouth daily. 45 tablet 3   No current facility-administered medications on file prior to visit.     Review of Systems  Constitutional: Negative for activity change, appetite change, fatigue, fever and unexpected weight change.  HENT: Negative for congestion, rhinorrhea, sore throat and trouble swallowing.   Eyes: Negative for pain, redness, itching and visual disturbance.  Respiratory: Negative for cough, chest tightness, shortness of breath and wheezing.   Cardiovascular: Negative for chest pain and palpitations.  Gastrointestinal: Negative for abdominal pain, blood in stool, constipation, diarrhea and nausea.  Endocrine: Negative for cold intolerance, heat intolerance, polydipsia and polyuria.  Genitourinary: Negative for difficulty urinating, dysuria, frequency and urgency.  Musculoskeletal: Negative for arthralgias, joint swelling and myalgias.       Some aches and pains   Skin: Negative for pallor and rash.  Neurological: Negative for dizziness, tremors, weakness, numbness and headaches.  Hematological: Negative for adenopathy. Does not bruise/bleed easily.  Psychiatric/Behavioral: Negative for decreased concentration and dysphoric mood. The patient is not nervous/anxious.        Objective:   Physical Exam  Constitutional: He appears well-developed and well-nourished. No distress.  obese and well appearing   HENT:  Head: Normocephalic and atraumatic.  Mouth/Throat: Oropharynx is clear and moist.  Eyes: Pupils are equal, round, and reactive to light. Conjunctivae and EOM are normal.  Neck: Normal range of motion. Neck supple. No JVD present. Carotid bruit is not present. No thyromegaly present.  Cardiovascular: Normal rate, regular rhythm, normal heart sounds and intact distal pulses. Exam reveals no gallop.  Pulmonary/Chest: Effort normal and breath sounds normal. No respiratory distress. He has no wheezes. He has no rales.  No crackles  Abdominal: Soft. Bowel  sounds are normal. He exhibits no distension, no abdominal bruit and no mass. There is no tenderness.  Musculoskeletal: He exhibits no edema or deformity.  No edema  Wearing support socks  Lymphadenopathy:    He has no cervical adenopathy.  Neurological: He is alert. He has normal reflexes. He displays normal reflexes. No cranial nerve deficit. Coordination normal.  Skin: Skin is warm and dry. No rash noted.  Psychiatric: He has a normal mood and affect. His mood appears not anxious. His affect is not blunt and not labile. He does not exhibit a depressed mood.  Pleasant   Baseline cognitive deficit           Assessment & Plan:

## 2018-03-13 NOTE — Assessment & Plan Note (Signed)
Nl cbc today  Continue to watch

## 2018-03-22 ENCOUNTER — Telehealth: Payer: Self-pay | Admitting: *Deleted

## 2018-03-22 NOTE — Telephone Encounter (Signed)
Yes-I am aware (that is why we are starting low dose) - if he experiences any side effects like aches or pains please let me know

## 2018-03-22 NOTE — Telephone Encounter (Signed)
Daniel Bailey notified of Dr. Marliss Coots comments and verbalized understanding.

## 2018-03-22 NOTE — Telephone Encounter (Signed)
Spoke to [place], (on [place]) who states she recently picked up pts new Rx for atorvastatin and was advised by pharmacist that there may be an interaction with his lopid. [name] is wanting to confirm ok for pt to begin taking, before doing so. pls advise

## 2018-04-24 ENCOUNTER — Other Ambulatory Visit (INDEPENDENT_AMBULATORY_CARE_PROVIDER_SITE_OTHER): Payer: Medicare Other

## 2018-04-24 DIAGNOSIS — E781 Pure hyperglyceridemia: Secondary | ICD-10-CM

## 2018-04-24 LAB — LIPID PANEL
CHOLESTEROL: 133 mg/dL (ref 0–200)
HDL: 40 mg/dL (ref >39.00)
LDL Cholesterol: 63 mg/dL (ref 0–99)
NONHDL: 92.66
TRIGLYCERIDES: 147 mg/dL (ref 0.0–149.0)
Total CHOL/HDL Ratio: 3
VLDL: 29.4 mg/dL (ref 0.0–40.0)

## 2018-04-24 LAB — ALT: ALT: 18 U/L (ref 0–53)

## 2018-04-24 LAB — AST: AST: 23 U/L (ref 0–37)

## 2018-09-26 ENCOUNTER — Telehealth: Payer: Self-pay | Admitting: Family Medicine

## 2018-09-26 ENCOUNTER — Other Ambulatory Visit (INDEPENDENT_AMBULATORY_CARE_PROVIDER_SITE_OTHER): Payer: Medicare Other

## 2018-09-26 ENCOUNTER — Ambulatory Visit (INDEPENDENT_AMBULATORY_CARE_PROVIDER_SITE_OTHER): Payer: Medicare Other

## 2018-09-26 DIAGNOSIS — E781 Pure hyperglyceridemia: Secondary | ICD-10-CM

## 2018-09-26 DIAGNOSIS — M79674 Pain in right toe(s): Secondary | ICD-10-CM | POA: Diagnosis not present

## 2018-09-26 DIAGNOSIS — I10 Essential (primary) hypertension: Secondary | ICD-10-CM

## 2018-09-26 DIAGNOSIS — D709 Neutropenia, unspecified: Secondary | ICD-10-CM

## 2018-09-26 DIAGNOSIS — B351 Tinea unguium: Secondary | ICD-10-CM | POA: Diagnosis not present

## 2018-09-26 DIAGNOSIS — E119 Type 2 diabetes mellitus without complications: Secondary | ICD-10-CM

## 2018-09-26 DIAGNOSIS — Z0000 Encounter for general adult medical examination without abnormal findings: Secondary | ICD-10-CM | POA: Diagnosis not present

## 2018-09-26 DIAGNOSIS — M79675 Pain in left toe(s): Secondary | ICD-10-CM | POA: Diagnosis not present

## 2018-09-26 LAB — CBC WITH DIFFERENTIAL/PLATELET
Basophils Absolute: 0.1 10*3/uL (ref 0.0–0.1)
Basophils Relative: 1.3 % (ref 0.0–3.0)
Eosinophils Absolute: 0.1 10*3/uL (ref 0.0–0.7)
Eosinophils Relative: 2.5 % (ref 0.0–5.0)
HCT: 39.3 % (ref 39.0–52.0)
Hemoglobin: 13.4 g/dL (ref 13.0–17.0)
Lymphocytes Relative: 39.8 % (ref 12.0–46.0)
Lymphs Abs: 1.7 10*3/uL (ref 0.7–4.0)
MCHC: 34 g/dL (ref 30.0–36.0)
MCV: 88.9 fl (ref 78.0–100.0)
Monocytes Absolute: 0.5 10*3/uL (ref 0.1–1.0)
Monocytes Relative: 10.8 % (ref 3.0–12.0)
Neutro Abs: 2 10*3/uL (ref 1.4–7.7)
Neutrophils Relative %: 45.6 % (ref 43.0–77.0)
Platelets: 347 10*3/uL (ref 150.0–400.0)
RBC: 4.42 Mil/uL (ref 4.22–5.81)
RDW: 13.9 % (ref 11.5–15.5)
WBC: 4.4 10*3/uL (ref 4.0–10.5)

## 2018-09-26 LAB — COMPREHENSIVE METABOLIC PANEL
ALT: 23 U/L (ref 0–53)
AST: 24 U/L (ref 0–37)
Albumin: 4.3 g/dL (ref 3.5–5.2)
Alkaline Phosphatase: 61 U/L (ref 39–117)
BUN: 15 mg/dL (ref 6–23)
CO2: 25 mEq/L (ref 19–32)
Calcium: 9.1 mg/dL (ref 8.4–10.5)
Chloride: 101 mEq/L (ref 96–112)
Creatinine, Ser: 1.1 mg/dL (ref 0.40–1.50)
GFR: 80.38 mL/min (ref >60.00)
Glucose, Bld: 118 mg/dL — ABNORMAL HIGH (ref 70–99)
Potassium: 4.1 mEq/L (ref 3.5–5.1)
Sodium: 137 mEq/L (ref 135–145)
Total Bilirubin: 0.5 mg/dL (ref 0.2–1.2)
Total Protein: 7.7 g/dL (ref 6.0–8.3)

## 2018-09-26 LAB — LIPID PANEL
Cholesterol: 134 mg/dL (ref 0–200)
HDL: 41.5 mg/dL (ref >39.00)
LDL Cholesterol: 71 mg/dL (ref 0–99)
NonHDL: 92.72
Total CHOL/HDL Ratio: 3
Triglycerides: 109 mg/dL (ref 0.0–149.0)
VLDL: 21.8 mg/dL (ref 0.0–40.0)

## 2018-09-26 LAB — HEMOGLOBIN A1C: Hgb A1c MFr Bld: 7.3 % — ABNORMAL HIGH (ref 4.6–6.5)

## 2018-09-26 LAB — TSH: TSH: 3.5 u[IU]/mL (ref 0.35–4.50)

## 2018-09-26 NOTE — Progress Notes (Signed)
Subjective:   Daniel Gravely. is a 68 y.o. male who presents for Medicare Annual/Subsequent preventive examination.  Review of Systems:  N/A Cardiac Risk Factors include: advanced age (>2men, >10 women);hypertension;diabetes mellitus;obesity (BMI >30kg/m2);male gender;dyslipidemia     Objective:    Vitals: There were no vitals taken for this visit.  There is no height or weight on file to calculate BMI.  Advanced Directives 09/26/2018 11/20/2017 11/13/2017 08/29/2017 08/25/2016  Does Patient Have a Medical Advance Directive? Yes Yes No Yes No  Type of Paramedic of [place];Living will [place] (No Data) [employer];Living will -  Does patient want to make changes to medical advance directive? No - Guardian declined No - Patient declined No - Patient declined - -  Copy of [place] in Chart? Yes - validated most recent copy scanned in chart (See row information) No - copy requested - No - copy requested -  Would patient like information on creating a medical advance directive? - - No - Patient declined - -    Tobacco Social History   Tobacco Use  Smoking Status Never Smoker  Smokeless Tobacco Never Used     Counseling given: No   Clinical Intake:  Pre-visit preparation completed: Yes  Pain : No/denies pain Pain Score: 0-No pain     Nutritional Status: BMI > 30  Obese Nutritional Risks: None Diabetes: Yes CBG done?: No Did pt. bring in CBG monitor from home?: No  How often do you need to have someone help you when you read instructions, pamphlets, or other written materials from your doctor or pharmacy?: 5 - Always  Interpreter Needed?: No  Comments: patient and mother live together with support of cousin and caregiver Information entered by :: LPinson, LPN  Past Medical History:  Diagnosis Date  . Glaucoma   . Hyperglycemia   . Hyperlipidemia   . Hypertension    Past Surgical  History:  Procedure Laterality Date  . COLONOSCOPY    . PROSTATE BIOPSY N/A 11/20/2017   Procedure: BIOPSY TRANSRECTAL ULTRASONIC PROSTATE (TUBP);  Surgeon: Abbie Sons, MD;  Location: [facility] ORS;  Service: Urology;  Laterality: N/A;   Family History  Problem Relation Age of Onset  . Colon cancer Mother    Social History   Socioeconomic History  . Marital status: Married    Spouse name: Not on file  . Number of children: Not on file  . Years of education: Not on file  . Highest education level: Not on file  Occupational History  . Not on file  Social Needs  . Financial resource strain: Not on file  . Food insecurity:    Worry: Not on file    Inability: Not on file  . Transportation needs:    Medical: Not on file    Non-medical: Not on file  Tobacco Use  . Smoking status: Never Smoker  . Smokeless tobacco: Never Used  Substance and Sexual Activity  . Alcohol use: No    Alcohol/week: 0.0 standard drinks  . Drug use: No  . Sexual activity: Not Currently  Lifestyle  . Physical activity:    Days per week: Not on file    Minutes per session: Not on file  . Stress: Not on file  Relationships  . Social connections:    Talks on phone: Not on file    Gets together: Not on file    Attends religious service: Not on file  Active member of club or organization: Not on file    Attends meetings of clubs or organizations: Not on file    Relationship status: Not on file  Other Topics Concern  . Not on file  Social History Narrative  . Not on file    Outpatient Encounter Medications as of 09/26/2018  Medication Sig  . acetaminophen (TYLENOL) 500 MG tablet Take 1,000 mg by mouth daily as needed for headache.  Marland Kitchen atorvastatin (LIPITOR) 10 MG tablet Take 1 tablet (10 mg total) by mouth daily. In the evening  . gemfibrozil (LOPID) 600 MG tablet Take 1 tablet (600 mg total) by mouth 2 (two) times daily.  Marland Kitchen lisinopril (PRINIVIL,ZESTRIL) 5 MG tablet Take 0.5 tablets (2.5 mg total)  by mouth daily.   No facility-administered encounter medications on file as of 09/26/2018.     Activities of Daily Living In your present state of health, do you have any difficulty performing the following activities: 09/26/2018 11/13/2017  Hearing? N N  Vision? N N  Difficulty concentrating or making decisions? Y N  Walking or climbing stairs? N Y  Dressing or bathing? N N  Doing errands, shopping? Y N  Preparing Food and eating ? Y -  Using the Toilet? N -  In the past six months, have you accidently leaked urine? N -  Do you have problems with loss of bowel control? N -  Managing your Medications? Y -  Managing your Finances? Y -  Housekeeping or managing your Housekeeping? Y -  Some recent data might be hidden    Patient Care Team: Tower, Wynelle Fanny, MD as PCP - General   Assessment:   This is a routine wellness examination for Daniel Bailey.  Exercise Activities and Dietary recommendations Current Exercise Habits: The patient does not participate in regular exercise at present, Exercise limited by: None identified  Goals    . Patient Stated     Starting 09/26/2018, I will continue to take medications as prescribed.        Fall Risk Fall Risk  09/26/2018 08/29/2017 08/25/2016 08/25/2015 08/21/2014  Falls in the past year? 0 No No No No   I  Depression Screen PHQ 2/9 Scores 09/26/2018 08/29/2017 08/25/2016 08/25/2015  PHQ - 2 Score 0 0 0 0  PHQ- 9 Score 0 0 - -    Cognitive Function MMSE - Mini Mental State Exam 09/26/2018 08/29/2017 08/25/2016  Not completed: Unable to complete Unable to complete Unable to complete    Note: Mini-Cog was not completed. Dx of Mental retardation.     Immunization History  Administered Date(s) Administered  . Influenza Split 02/19/2012  . Influenza,inj,Quad PF,6+ Mos 02/28/2013, 02/18/2014, 02/22/2015, 02/25/2016, 03/02/2017, 03/13/2018  . Pneumococcal Conjugate-13 08/25/2015  . Pneumococcal Polysaccharide-23 08/25/2016  . Td 02/03/1997,  12/10/2006, 11/23/2009    Screening Tests Health Maintenance  Topic Date Due  . FOOT EXAM  10/06/2018 (Originally 09/06/2018)  . OPHTHALMOLOGY EXAM  05/07/2020 (Originally 01/08/1961)  . INFLUENZA VACCINE  12/07/2018  . HEMOGLOBIN A1C  03/29/2019  . TETANUS/TDAP  11/24/2019  . COLONOSCOPY  04/10/2022  . Hepatitis C Screening  Completed  . PNA vac Low Risk Adult  Completed       Plan:     I have personally reviewed, addressed, and noted the following in the patient's chart:  A. Medical and social history B. Use of alcohol, tobacco or illicit drugs  C. Current medications and supplements D. Functional ability and status E.  Nutritional status F.  Physical activity G. Advance directives H. List of other physicians I.  Hospitalizations, surgeries, and ER visits in previous 12 months J.  Vitals (unless it is a telemedicine encounter) K. Screenings to include cognitive, depression, hearing, vision (NOTE: hearing and vision screenings not completed in telemedicine encounter) L. Referrals and appointments   In addition, I have reviewed and discussed with patient certain preventive protocols, quality metrics, and best practice recommendations. A written personalized care plan for preventive services and recommendations were provided to patient.  With patient's permission, we connected on 09/26/18 at 12:30 PM EDT by a video enabled telemedicine application. Two patient identifiers were used to ensure the encounter occurred with the correct person.    Patient was in home and writer was in office.   Signed,   Lindell Noe, MHA, BS, LPN Health Coach

## 2018-09-26 NOTE — Telephone Encounter (Signed)
-----   Message from Ellamae Sia sent at 09/26/2018  7:53 AM EDT ----- Regarding: lab orders for today Lab orders

## 2018-09-27 NOTE — Progress Notes (Signed)
PCP notes:   Health maintenance:  Foot exam - PCP follow-up requested Eye exam - addressed A1C - completed  Abnormal screenings:   None  Patient concerns:   None  Nurse concerns:  None  Next PCP appt:   10/01/18 @ 1500  I reviewed health advisor's note, was available for consultation, and agree with documentation and plan. Loura Pardon MD

## 2018-09-27 NOTE — Patient Instructions (Signed)
Mr. Voorheis , Thank you for taking time to come for your Medicare Wellness Visit. I appreciate your ongoing commitment to your health goals. Please review the following plan we discussed and let me know if I can assist you in the future.   These are the goals we discussed: Goals    . Patient Stated     Starting 09/26/2018, I will continue to take medications as prescribed.        This is a list of the screening recommended for you and due dates:  Health Maintenance  Topic Date Due  . Complete foot exam   10/06/2018*  . Eye exam for diabetics  05/07/2020*  . Flu Shot  12/07/2018  . Hemoglobin A1C  03/29/2019  . Tetanus Vaccine  11/24/2019  . Colon Cancer Screening  04/10/2022  .  Hepatitis C: One time screening is recommended by Center for Disease Control  (CDC) for  adults born from [birth year] through 1966.   Completed  . Pneumonia vaccines  Completed  *Topic was postponed. The date shown is not the original due date.   Preventive Care for Adults  A healthy lifestyle and preventive care can promote health and wellness. Preventive health guidelines for adults include the following key practices.  . A routine yearly physical is a good way to check with your health care provider about your health and preventive screening. It is a chance to share any concerns and updates on your health and to receive a thorough exam.  . Visit your dentist for a routine exam and preventive care every 6 months. Brush your teeth twice a day and floss once a day. Good oral hygiene prevents tooth decay and gum disease.  . The frequency of eye exams is based on your age, health, family medical history, use  of contact lenses, and other factors. Follow your health care provider's recommendations for frequency of eye exams.  . Eat a healthy diet. Foods like vegetables, fruits, whole grains, low-fat dairy products, and lean protein foods contain the nutrients you need without too many calories. Decrease your intake of  foods high in solid fats, added sugars, and salt. Eat the right amount of calories for you. Get information about a proper diet from your health care provider, if necessary.  . Regular physical exercise is one of the most important things you can do for your health. Most adults should get at least 150 minutes of moderate-intensity exercise (any activity that increases your heart rate and causes you to sweat) each week. In addition, most adults need muscle-strengthening exercises on 2 or more days a week.  Silver Sneakers may be a benefit available to you. To determine eligibility, you may visit the website: www.silversneakers.com or contact program at (416) 320-4898 Mon-Fri between 8AM-8PM.   . Maintain a healthy weight. The body mass index (BMI) is a screening tool to identify possible weight problems. It provides an estimate of body fat based on height and weight. Your health care provider can find your BMI and can help you achieve or maintain a healthy weight.   For adults 20 years and older: ? A BMI below 18.5 is considered underweight. ? A BMI of 18.5 to 24.9 is normal. ? A BMI of 25 to 29.9 is considered overweight. ? A BMI of 30 and above is considered obese.   . Maintain normal blood lipids and cholesterol levels by exercising and minimizing your intake of saturated fat. Eat a balanced diet with plenty of fruit and vegetables. Blood  tests for lipids and cholesterol should begin at age 72 and be repeated every 5 years. If your lipid or cholesterol levels are high, you are over 50, or you are at high risk for heart disease, you may need your cholesterol levels checked more frequently. Ongoing high lipid and cholesterol levels should be treated with medicines if diet and exercise are not working.  . If you smoke, find out from your health care provider how to quit. If you do not use tobacco, please do not start.  . If you choose to drink alcohol, please do not consume more than 2 drinks per  day. One drink is considered to be 12 ounces (355 mL) of beer, 5 ounces (148 mL) of wine, or 1.5 ounces (44 mL) of liquor.  . If you are 57-23 years old, ask your health care provider if you should take aspirin to prevent strokes.  . Use sunscreen. Apply sunscreen liberally and repeatedly throughout the day. You should seek shade when your shadow is shorter than you. Protect yourself by wearing long sleeves, pants, a wide-brimmed hat, and sunglasses year round, whenever you are outdoors.  . Once a month, do a whole body skin exam, using a mirror to look at the skin on your back. Tell your health care provider of new moles, moles that have irregular borders, moles that are larger than a pencil eraser, or moles that have changed in shape or color.

## 2018-10-01 ENCOUNTER — Other Ambulatory Visit: Payer: Self-pay

## 2018-10-01 ENCOUNTER — Ambulatory Visit (INDEPENDENT_AMBULATORY_CARE_PROVIDER_SITE_OTHER): Payer: Medicare Other | Admitting: Family Medicine

## 2018-10-01 ENCOUNTER — Encounter: Payer: Self-pay | Admitting: Family Medicine

## 2018-10-01 ENCOUNTER — Encounter: Payer: Medicare Other | Admitting: Family Medicine

## 2018-10-01 VITALS — BP 122/68 | HR 92 | Temp 98.3°F | Ht 66.5 in | Wt 246.4 lb

## 2018-10-01 DIAGNOSIS — I10 Essential (primary) hypertension: Secondary | ICD-10-CM | POA: Diagnosis not present

## 2018-10-01 DIAGNOSIS — Z1211 Encounter for screening for malignant neoplasm of colon: Secondary | ICD-10-CM | POA: Diagnosis not present

## 2018-10-01 DIAGNOSIS — R972 Elevated prostate specific antigen [PSA]: Secondary | ICD-10-CM | POA: Diagnosis not present

## 2018-10-01 DIAGNOSIS — E119 Type 2 diabetes mellitus without complications: Secondary | ICD-10-CM

## 2018-10-01 DIAGNOSIS — E782 Mixed hyperlipidemia: Secondary | ICD-10-CM

## 2018-10-01 DIAGNOSIS — E6609 Other obesity due to excess calories: Secondary | ICD-10-CM

## 2018-10-01 DIAGNOSIS — E1169 Type 2 diabetes mellitus with other specified complication: Secondary | ICD-10-CM | POA: Diagnosis not present

## 2018-10-01 DIAGNOSIS — Z6837 Body mass index (BMI) 37.0-37.9, adult: Secondary | ICD-10-CM | POA: Diagnosis not present

## 2018-10-01 MED ORDER — LISINOPRIL 5 MG PO TABS: 2.5000 mg | ORAL_TABLET | Freq: Every day | ORAL | 3 refills | Status: DC

## 2018-10-01 MED ORDER — METFORMIN HCL ER 500 MG PO TB24: 500.0000 mg | ORAL_TABLET | Freq: Every day | ORAL | 3 refills | Status: DC

## 2018-10-01 MED ORDER — ATORVASTATIN CALCIUM 10 MG PO TABS: 10.0000 mg | ORAL_TABLET | Freq: Every day | ORAL | 3 refills | Status: DC

## 2018-10-01 MED ORDER — GEMFIBROZIL 600 MG PO TABS: 600.0000 mg | ORAL_TABLET | Freq: Two times a day (BID) | ORAL | 3 refills | Status: DC

## 2018-10-01 NOTE — Assessment & Plan Note (Signed)
Lab Results  Component Value Date   HGBA1C 7.3 (H) 09/26/2018   This is significantly worse New aides are not cooking as healthy/he is eating more  Family will work on this He is getting out for walks-stressed imp of more activity and wt loss disc imp of low glycemic diet and wt loss to prevent DM2  Will start low dose metformin xr 500  inst to call if side effects or problems

## 2018-10-01 NOTE — Progress Notes (Signed)
Subjective:    Patient ID: Daniel Bailey., male    DOB: 11-Apr-1951, 68 y.o.   MRN: [id number]  HPI Here for annual f/u of chronic health problems  Says he is doing ok  Is staying home with pandemic  Gets outdoors when he can  Tries to get out and walk every time he can   Wt Readings from Last 3 Encounters:  10/01/18 246 lb 7 oz (111.8 kg)  03/13/18 237 lb 8 oz (107.7 kg)  11/20/17 217 lb 8 oz (98.7 kg)  eating more / the aides cook too much and they need to curtail it  He eats less when they are not there  Breakfast is way too big  39.18 kg/m   He had amw on 5/21 No concerns   Due for eye exam -had to be cancelled  No vision problems   Last colonoscopy 12/13  Mother had colon cancer -but still 71 y recall    Prostate health Lab Results  Component Value Date   PSA 7.62 (H) 08/29/2017   PSA 1.11 08/25/2016   PSA 0.95 08/18/2015   he was ref to urology last time  Drinking lots of water  No urinary c/o  No nocturia    bp is stable today  No cp or palpitations or headaches or edema  No side effects to medicines  BP Readings from Last 3 Encounters:  10/01/18 122/68  03/13/18 124/68  11/20/17 126/69    Taking lisinopril  DM2 Lab Results  Component Value Date   HGBA1C 7.3 (H) 09/26/2018  this is up from 6.7  Significant  Also weight gain  Eating too many snack foods and large meals   Hyperlipidemia/triglycerides Lab Results  Component Value Date   CHOL 134 09/26/2018   CHOL 133 04/24/2018   CHOL 178 03/08/2018   Lab Results  Component Value Date   HDL 41.50 09/26/2018   HDL 40.00 04/24/2018   HDL 35.80 (L) 03/08/2018   Lab Results  Component Value Date   LDLCALC 71 09/26/2018   LDLCALC 63 04/24/2018   LDLCALC 115 (H) 03/08/2018   Lab Results  Component Value Date   TRIG 109.0 09/26/2018   TRIG 147.0 04/24/2018   TRIG 134.0 03/08/2018   Lab Results  Component Value Date   CHOLHDL 3 09/26/2018   CHOLHDL 3 04/24/2018   CHOLHDL 5  03/08/2018   Lab Results  Component Value Date   LDLDIRECT 175.3 08/16/2009   Taking gemfibrozil Also lipitor   Lab Results  Component Value Date   CREATININE 1.10 09/26/2018   BUN 15 09/26/2018   NA 137 09/26/2018   K 4.1 09/26/2018   CL 101 09/26/2018   CO2 25 09/26/2018   Lab Results  Component Value Date   ALT 23 09/26/2018   AST 24 09/26/2018   ALKPHOS 61 09/26/2018   BILITOT 0.5 09/26/2018    Lab Results  Component Value Date   WBC 4.4 09/26/2018   HGB 13.4 09/26/2018   HCT 39.3 09/26/2018   MCV 88.9 09/26/2018   PLT 347.0 09/26/2018    Lab Results  Component Value Date   TSH 3.50 09/26/2018    Patient Active Problem List   Diagnosis Date Noted  . Neutropenia ([place]) 09/05/2017  . Elevated PSA 09/05/2017  . 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 Hypertriglyceridemia, essential 02/19/2014  . Encounter for Medicare annual wellness exam 08/27/2012  . Obesity 08/18/2011  . Colon cancer screening 08/18/2011  . Routine general medical examination at a  health care facility 08/10/2011  . Prostate cancer screening 08/10/2011  . Controlled type 2 diabetes mellitus without complication, without long-term current use of insulin ([place]) 05/30/2010  . HYPERTENSION, BENIGN ESSENTIAL 12/10/2006  . MENTAL RETARDATION 11/23/2006   Past Medical History:  Diagnosis Date  . Glaucoma   . Hyperglycemia   . Hyperlipidemia   . Hypertension    Past Surgical History:  Procedure Laterality Date  . COLONOSCOPY    . PROSTATE BIOPSY N/A 11/20/2017   Procedure: BIOPSY TRANSRECTAL ULTRASONIC PROSTATE (TUBP);  Surgeon: Abbie Sons, MD;  Location: [facility] ORS;  Service: Urology;  Laterality: N/A;   Social History   Tobacco Use  . Smoking status: Never Smoker  . Smokeless tobacco: Never Used  Substance Use Topics  . Alcohol use: No    Alcohol/week: 0.0 standard drinks  . Drug use: No   Family History  Problem Relation Age of Onset  . Colon cancer Mother    No Known Allergies  Current Outpatient Medications on File Prior to Visit  Medication Sig Dispense Refill  . acetaminophen (TYLENOL) 500 MG tablet Take 1,000 mg by mouth daily as needed for headache.     No current facility-administered medications on file prior to visit.     Review of Systems  Constitutional: Negative for activity change, appetite change, fatigue, fever and unexpected weight change.  HENT: Negative for congestion, rhinorrhea, sore throat and trouble swallowing.   Eyes: Negative for pain, redness, itching and visual disturbance.  Respiratory: Negative for cough, chest tightness, shortness of breath and wheezing.   Cardiovascular: Negative for chest pain and palpitations.  Gastrointestinal: Negative for abdominal pain, blood in stool, constipation, diarrhea and nausea.  Endocrine: Negative for cold intolerance, heat intolerance, polydipsia and polyuria.  Genitourinary: Negative for difficulty urinating, dysuria, frequency and urgency.  Musculoskeletal: Negative for arthralgias, joint swelling and myalgias.  Skin: Negative for pallor and rash.  Neurological: Negative for dizziness, tremors, weakness, numbness and headaches.  Hematological: Negative for adenopathy. Does not bruise/bleed easily.  Psychiatric/Behavioral: Negative for decreased concentration and dysphoric mood. The patient is not nervous/anxious.        Objective:   Physical Exam Constitutional:      General: He is not in acute distress.    Appearance: He is well-developed. He is obese. He is not ill-appearing or diaphoretic.  HENT:     Head: Normocephalic and atraumatic.     Right Ear: Tympanic membrane, ear canal and external ear normal.     Left Ear: Tympanic membrane, ear canal and external ear normal.     Nose: Nose normal.     Mouth/Throat:     Mouth: Mucous membranes are moist.     Pharynx: Oropharynx is clear. No posterior oropharyngeal erythema.  Eyes:     General: No scleral icterus.       Right eye: No  discharge.        Left eye: No discharge.     Conjunctiva/sclera: Conjunctivae normal.     Pupils: Pupils are equal, round, and reactive to light.  Neck:     Musculoskeletal: Normal range of motion and neck supple.     Thyroid: No thyromegaly.     Vascular: No carotid bruit or JVD.  Cardiovascular:     Rate and Rhythm: Regular rhythm. Tachycardia present.     Heart sounds: Normal heart sounds. No gallop.      Comments: Rate 92 Mild tachycardia after walking in  Pulmonary:     Effort: Pulmonary effort  is normal. No respiratory distress.     Breath sounds: Normal breath sounds. No wheezing or rales.     Comments: Good air exch Chest:     Chest wall: No tenderness.  Abdominal:     General: Bowel sounds are normal. There is no distension or abdominal bruit.     Palpations: Abdomen is soft. There is no mass.     Tenderness: There is no abdominal tenderness.  Musculoskeletal:        General: No tenderness.     Right lower leg: No edema.     Left lower leg: No edema.  Lymphadenopathy:     Cervical: No cervical adenopathy.  Skin:    General: Skin is warm and dry.     Capillary Refill: Capillary refill takes less than 2 seconds.     Coloration: Skin is not pale.     Findings: No erythema or rash.     Comments: Some sks on trunk and back   Toe nails recently trimmed at podiatrist on 5/21  Neurological:     Mental Status: He is alert. Mental status is at baseline.     Cranial Nerves: No cranial nerve deficit.     Motor: No weakness or abnormal muscle tone.     Coordination: Coordination normal.     Gait: Gait normal.     Deep Tendon Reflexes: Reflexes are normal and symmetric. Reflexes normal.  Psychiatric:        Mood and Affect: Mood normal.     Comments: Baseline intellectual deficit Very pleasant  Answers questions appropriately  Cooperative with exam            Assessment & Plan:   Problem List Items Addressed This Visit      Cardiovascular and Mediastinum    HYPERTENSION, BENIGN ESSENTIAL    bp in fair control at this time  BP Readings from Last 1 Encounters:  10/01/18 122/68   No changes needed Most recent labs reviewed  Disc lifstyle change with low sodium diet and exercise  Continue lisinopril Strongly encouraged DASH eating/wt loss       Relevant Medications   atorvastatin (LIPITOR) 10 MG tablet   gemfibrozil (LOPID) 600 MG tablet   lisinopril (ZESTRIL) 5 MG tablet     Endocrine   Controlled type 2 diabetes mellitus without complication, without long-term current use of insulin ([place]) - Primary    Lab Results  Component Value Date   HGBA1C 7.3 (H) 09/26/2018   This is significantly worse New aides are not cooking as healthy/he is eating more  Family will work on this He is getting out for walks-stressed imp of more activity and wt loss disc imp of low glycemic diet and wt loss to prevent DM2  Will start low dose metformin xr 500  inst to call if side effects or problems       Relevant Medications   atorvastatin (LIPITOR) 10 MG tablet   lisinopril (ZESTRIL) 5 MG tablet   metFORMIN (GLUCOPHAGE-XR) 500 MG 24 hr tablet   Mixed hyperlipidemia due to type 2 diabetes mellitus ([place])    Disc goals for lipids and reasons to control them Rev last labs with pt Rev low sat fat diet in detail  Gemfibrozil has triglycerides in fairly good control  Atorvastatin has LDL down to 71      Relevant Medications   atorvastatin (LIPITOR) 10 MG tablet   gemfibrozil (LOPID) 600 MG tablet   lisinopril (ZESTRIL) 5 MG tablet  metFORMIN (GLUCOPHAGE-XR) 500 MG 24 hr tablet     Other   Obesity    Pt has gained significant wt making A1C higher  Discussed how this problem influences overall health and the risks it imposes  Reviewed plan for weight loss with lower calorie diet (via better food choices and also portion control or program like weight watchers) and exercise building up to or more than 30 minutes 5 days per week including some aerobic  activity   Disc plan to stop the high calorie cooking and get him out to walk more        Relevant Medications   metFORMIN (GLUCOPHAGE-XR) 500 MG 24 hr tablet   Colon cancer screening    Reviewed last colonoscopy-pt has a 10 y recall  No bowel changes per family      Elevated PSA    Unsure when urology f/u is due Had bx 7/19 -neg  Will send to urology for last note

## 2018-10-01 NOTE — Assessment & Plan Note (Signed)
Pt has gained significant wt making A1C higher  Discussed how this problem influences overall health and the risks it imposes  Reviewed plan for weight loss with lower calorie diet (via better food choices and also portion control or program like weight watchers) and exercise building up to or more than 30 minutes 5 days per week including some aerobic activity   Disc plan to stop the high calorie cooking and get him out to walk more

## 2018-10-01 NOTE — Assessment & Plan Note (Signed)
Disc goals for lipids and reasons to control them Rev last labs with pt Rev low sat fat diet in detail  Gemfibrozil has triglycerides in fairly good control  Atorvastatin has LDL down to 71

## 2018-10-01 NOTE — Patient Instructions (Addendum)
Don't forget to schedule your eye exam   Start metformin xr 500 mg daily in the am  Watch diet  Work on weight loss Try to get most of your carbohydrates from produce (with the exception of white potatoes)  Eat less bread/pasta/rice/snack foods/cereals/sweets and other items from the middle of the grocery store (processed carbs)   Try to walk for exercise every day at least 30 minutes

## 2018-10-01 NOTE — Assessment & Plan Note (Signed)
Unsure when urology f/u is due Had bx 7/19 -neg  Will send to urology for last note

## 2018-10-01 NOTE — Assessment & Plan Note (Signed)
Reviewed last colonoscopy-pt has a 79 y recall  No bowel changes per family

## 2018-10-01 NOTE — Assessment & Plan Note (Signed)
bp in fair control at this time  BP Readings from Last 1 Encounters:  10/01/18 122/68   No changes needed Most recent labs reviewed  Disc lifstyle change with low sodium diet and exercise  Continue lisinopril Strongly encouraged DASH eating/wt loss

## 2019-01-01 ENCOUNTER — Encounter: Payer: Self-pay | Admitting: Family Medicine

## 2019-01-01 ENCOUNTER — Ambulatory Visit (INDEPENDENT_AMBULATORY_CARE_PROVIDER_SITE_OTHER): Payer: Medicare Other | Admitting: Family Medicine

## 2019-01-01 DIAGNOSIS — E1169 Type 2 diabetes mellitus with other specified complication: Secondary | ICD-10-CM | POA: Diagnosis not present

## 2019-01-01 DIAGNOSIS — I10 Essential (primary) hypertension: Secondary | ICD-10-CM

## 2019-01-01 DIAGNOSIS — E782 Mixed hyperlipidemia: Secondary | ICD-10-CM | POA: Diagnosis not present

## 2019-01-01 DIAGNOSIS — E119 Type 2 diabetes mellitus without complications: Secondary | ICD-10-CM

## 2019-01-01 NOTE — Progress Notes (Signed)
Virtual Visit via Video Note  I connected with Daniel Bailey. on 01/01/19 at  3:30 PM EDT by a video enabled telemedicine application and verified that I am speaking with the correct person using two identifiers.  Location: Patient: home Provider: office    I discussed the limitations of evaluation and management by telemedicine and the availability of in person appointments. The patient expressed understanding and agreed to proceed.  History of Present Illness: Pt presents with 3 mo f/u for chronic health problems   He is sitting around a lot  Watches TV Gets out on the porch Too hot to walk   Weight -looks about the same/ no different No scale to weigh  BP has been well controlled   DM2 Lab Results  Component Value Date   HGBA1C 7.3 (H) 09/26/2018  this was significantly worse -was eating differently  Diet - is much better /cut out the big high calorie breakfast  Not buying sweets , no sweet drinks  occ sandwich (bread)  Some vegetables - greens /mixed /turnip salads  Exercise -none  Started metformin xr 500 mg at that time-no problems  Eye exam - family needs to make that appt Foot care-fine /no problems  Flu shot  On ace for renal protection -small dose   Lipids Lab Results  Component Value Date   CHOL 134 09/26/2018   HDL 41.50 09/26/2018   LDLCALC 71 09/26/2018   LDLDIRECT 175.3 08/16/2009   TRIG 109.0 09/26/2018   CHOLHDL 3 09/26/2018   Taking statin and gemfibrozil  Good result in may   Patient Active Problem List   Diagnosis Date Noted  . Elevated PSA 09/05/2017  . 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 Mixed hyperlipidemia due to type 2 diabetes mellitus ([place]) 02/19/2014  . Encounter for Medicare annual wellness exam 08/27/2012  . Obesity 08/18/2011  . Colon cancer screening 08/18/2011  . Routine general medical examination at a health care facility 08/10/2011  . Prostate cancer screening 08/10/2011  . Controlled type 2 diabetes mellitus without  complication, without long-term current use of insulin ([place]) 05/30/2010  . HYPERTENSION, BENIGN ESSENTIAL 12/10/2006  . MENTAL RETARDATION 11/23/2006   Past Medical History:  Diagnosis Date  . Glaucoma   . Hyperglycemia   . Hyperlipidemia   . Hypertension    Past Surgical History:  Procedure Laterality Date  . COLONOSCOPY    . PROSTATE BIOPSY N/A 11/20/2017   Procedure: BIOPSY TRANSRECTAL ULTRASONIC PROSTATE (TUBP);  Surgeon: Abbie Sons, MD;  Location: [facility] ORS;  Service: Urology;  Laterality: N/A;   Social History   Tobacco Use  . Smoking status: Never Smoker  . Smokeless tobacco: Never Used  Substance Use Topics  . Alcohol use: No    Alcohol/week: 0.0 standard drinks  . Drug use: No   Family History  Problem Relation Age of Onset  . Colon cancer Mother    No Known Allergies Current Outpatient Medications on File Prior to Visit  Medication Sig Dispense Refill  . acetaminophen (TYLENOL) 500 MG tablet Take 1,000 mg by mouth daily as needed for headache.    Marland Kitchen atorvastatin (LIPITOR) 10 MG tablet Take 1 tablet (10 mg total) by mouth daily. In the evening 90 tablet 3  . gemfibrozil (LOPID) 600 MG tablet Take 1 tablet (600 mg total) by mouth 2 (two) times daily. 180 tablet 3  . lisinopril (ZESTRIL) 5 MG tablet Take 0.5 tablets (2.5 mg total) by mouth daily. 45 tablet 3  . metFORMIN (GLUCOPHAGE-XR) 500 MG 24  hr tablet Take 1 tablet (500 mg total) by mouth daily with breakfast. 90 tablet 3   No current facility-administered medications on file prior to visit.    Review of Systems  Constitutional: Negative for chills, fever, malaise/fatigue and weight loss.  HENT: Negative for sore throat.   Eyes: Negative for blurred vision, discharge and redness.  Respiratory: Negative for cough and shortness of breath.   Cardiovascular: Negative for chest pain, palpitations and leg swelling.  Gastrointestinal: Negative for nausea and vomiting.  Musculoskeletal: Negative for myalgias.   Skin: Negative for rash.  Neurological: Negative for dizziness, sensory change, weakness and headaches.  Endo/Heme/Allergies: Negative for polydipsia.      Observations/Objective: Patient appears well, in no distress Weight is baseline (obese No facial swelling or asymmetry Normal voice-not hoarse and no slurred speech No obvious tremor or mobility impairment Moving neck and UEs normally Able to hear the call well  No cough or shortness of breath during interview  Baseline intellectual impairment, pleasant and answers questions (family helps with history) No skin changes on face or neck , no rash or pallor Affect is normal    Assessment and Plan: Problem List Items Addressed This Visit      Cardiovascular and Mediastinum   HYPERTENSION, BENIGN ESSENTIAL    No issues with bp at home Eating a better diet Enc more exercise         Endocrine   Controlled type 2 diabetes mellitus without complication, without long-term current use of insulin ([place]) - Primary    With better diet and metformin xr I expect a better A1C upcoming (lab will be scheduled) Enc more vegetables/less processed carbs  Enc walking-work up to 30 min daily in the house  Needs DM eye exam-family promised to schedule  No vision loss or DM symptoms  On ace and statin  F/u will be based on A1C result  Pt will get flu shot the same day her comes for A1C      Relevant Orders   POCT glycosylated hemoglobin (Hb A1C)   Mixed hyperlipidemia due to type 2 diabetes mellitus ([facility])    Controlled with statin and gemfibrozil Disc goals for lipids and reasons to control them Rev last labs with pt Rev low sat fat diet in detail           Follow Up Instructions: Try to work up to 30 minutes of walking per day in the house (outdoors when weather cools off)  Continue to eat better- avoid the large fatty breakfasts and eat more green vegetables/ continue to avoid sweets and sweet drinks Check weights when you get as  scale in the future  Continue current medicines  The office will call you to set up a flu shot (high dose) and A1c by finger stick  We will make a plan from there Please schedule your diabetic eye exam asap   I discussed the assessment and treatment plan with the patient. The patient was provided an opportunity to ask questions and all were answered. The patient agreed with the plan and demonstrated an understanding of the instructions.   The patient was advised to call back or seek an in-person evaluation if the symptoms worsen or if the condition fails to improve as anticipated.   Loura Pardon, MD

## 2019-01-01 NOTE — Assessment & Plan Note (Signed)
Controlled with statin and gemfibrozil Disc goals for lipids and reasons to control them Rev last labs with pt Rev low sat fat diet in detail

## 2019-01-01 NOTE — Patient Instructions (Addendum)
Try to work up to 30 minutes of walking per day in the house (outdoors when weather cools off)  Continue to eat better- avoid the large fatty breakfasts and eat more green vegetables/ continue to avoid sweets and sweet drinks Check weights when you get as scale in the future  Continue current medicines  The office will call you to set up a flu shot (high dose) and A1c by finger stick  We will make a plan from there Please schedule your diabetic eye exam asap

## 2019-01-01 NOTE — Assessment & Plan Note (Signed)
No issues with bp at home Eating a better diet Enc more exercise

## 2019-01-01 NOTE — Assessment & Plan Note (Signed)
With better diet and metformin xr I expect a better A1C upcoming (lab will be scheduled) Enc more vegetables/less processed carbs  Enc walking-work up to 30 min daily in the house  Needs DM eye exam-family promised to schedule  No vision loss or DM symptoms  On ace and statin  F/u will be based on A1C result  Pt will get flu shot the same day her comes for A1C

## 2019-01-02 ENCOUNTER — Telehealth: Payer: Self-pay | Admitting: Family Medicine

## 2019-01-02 NOTE — Telephone Encounter (Signed)
I left a message on Daniel Bailey's voice mail to call back and schedule lab visit for POCT A1c and flu shot.  When I asked Daniel Bailey how to schedule she said:  Patient can come in for lab and while here please let him know to let the lab staff know that he needs a flu shot so they can notify whoever is on flu clinic duty that day.  Please put him on a flu clinic schedule with documentation in notes that he will be inside for lab and in lab notes that he needs flu shot.

## 2019-01-07 ENCOUNTER — Ambulatory Visit (INDEPENDENT_AMBULATORY_CARE_PROVIDER_SITE_OTHER): Payer: Medicare Other

## 2019-01-07 ENCOUNTER — Other Ambulatory Visit (INDEPENDENT_AMBULATORY_CARE_PROVIDER_SITE_OTHER): Payer: Medicare Other

## 2019-01-07 DIAGNOSIS — E119 Type 2 diabetes mellitus without complications: Secondary | ICD-10-CM

## 2019-01-07 DIAGNOSIS — Z23 Encounter for immunization: Secondary | ICD-10-CM | POA: Diagnosis not present

## 2019-01-07 LAB — POCT GLYCOSYLATED HEMOGLOBIN (HGB A1C): Hemoglobin A1C: 6.7 % — AB (ref 4.0–5.6)

## 2019-01-08 ENCOUNTER — Encounter: Payer: Self-pay | Admitting: *Deleted

## 2019-01-14 ENCOUNTER — Telehealth: Payer: Self-pay | Admitting: *Deleted

## 2019-01-14 NOTE — Telephone Encounter (Signed)
Aware, thanks!

## 2019-01-14 NOTE — Telephone Encounter (Signed)
Daniel Bailey pts cousin (DPR signed) left v/m that Daniel Bailey spoke with pts pharmacy and they did not receive any metformin that is involved with the recall of metformin. Daniel Bailey said the metformin pt has is not involved with the recall. FYI to Dr [name].

## 2019-01-14 NOTE — Telephone Encounter (Signed)
Daniel Bailey patient's cousin (on [place]) called stating that he got a letter from the pharmacy stating that there may be issues with his Metformin. Daniel Bailey wants to know if his Metformin should be changed because of the issues?  Daniel Bailey stated that the letter referred to a recall on Metformin. Pharmacy [facility]

## 2019-02-25 DIAGNOSIS — H25813 Combined forms of age-related cataract, bilateral: Secondary | ICD-10-CM | POA: Diagnosis not present

## 2019-02-25 DIAGNOSIS — H35033 Hypertensive retinopathy, bilateral: Secondary | ICD-10-CM | POA: Diagnosis not present

## 2019-02-25 DIAGNOSIS — I10 Essential (primary) hypertension: Secondary | ICD-10-CM | POA: Diagnosis not present

## 2019-02-25 DIAGNOSIS — H524 Presbyopia: Secondary | ICD-10-CM | POA: Diagnosis not present

## 2019-02-25 DIAGNOSIS — E119 Type 2 diabetes mellitus without complications: Secondary | ICD-10-CM | POA: Diagnosis not present

## 2019-02-25 LAB — HM DIABETES EYE EXAM

## 2019-06-06 ENCOUNTER — Telehealth: Payer: Self-pay

## 2019-06-06 NOTE — Telephone Encounter (Signed)
Daniel Bailey, patient's cousin (on [place] on file) called stating the pharmacist told her to check with Dr [name] on gemfibrozil and atorvastatin medication that could interact possibly if taking together. Pharmacist said it could cause muscle weakness taking these 2 together. Please review.

## 2019-06-06 NOTE — Telephone Encounter (Signed)
I am aware-he has been on them for a while and no problems.

## 2019-06-06 NOTE — Telephone Encounter (Signed)
[name] notified of Dr. Marliss Coots comments

## 2019-07-01 ENCOUNTER — Telehealth: Payer: Self-pay | Admitting: Family Medicine

## 2019-07-01 NOTE — Telephone Encounter (Signed)
Patient's cousin called today to check on appointment,. She thought they scheduled a follow up but I did not see anything  She wanted to check to see when you would like to see the patient Last appt with you was 8/26

## 2019-07-01 NOTE — Telephone Encounter (Signed)
I spoke to [place] and she scheduled appointment on 07/11/19.

## 2019-07-01 NOTE — Telephone Encounter (Signed)
Sometime in march is fine

## 2019-07-07 DIAGNOSIS — M79674 Pain in right toe(s): Secondary | ICD-10-CM | POA: Diagnosis not present

## 2019-07-07 DIAGNOSIS — B351 Tinea unguium: Secondary | ICD-10-CM | POA: Diagnosis not present

## 2019-07-07 DIAGNOSIS — M79675 Pain in left toe(s): Secondary | ICD-10-CM | POA: Diagnosis not present

## 2019-07-07 DIAGNOSIS — L600 Ingrowing nail: Secondary | ICD-10-CM | POA: Diagnosis not present

## 2019-07-11 ENCOUNTER — Ambulatory Visit: Payer: Medicare Other | Admitting: Family Medicine

## 2019-07-13 ENCOUNTER — Ambulatory Visit: Payer: Medicare Other | Attending: Internal Medicine

## 2019-07-13 DIAGNOSIS — Z23 Encounter for immunization: Secondary | ICD-10-CM | POA: Insufficient documentation

## 2019-07-13 NOTE — Progress Notes (Signed)
   [id number]  Name:  Daniel Bailey.    MRN: [id number] DOB: [birth year]  07/13/2019  [name] was observed post Covid-19 immunization for 15 minutes without incident. He was provided with Vaccine Information Sheet and instruction to access the V-Safe system.   [name] was instructed to call 911 with any severe reactions post vaccine: Marland Kitchen Difficulty breathing  . Swelling of face and throat  . A fast heartbeat  . A bad rash all over body  . Dizziness and weakness   Immunizations Administered    Name Date Dose VIS Date Route   Pfizer COVID-19 Vaccine 07/13/2019  4:30 PM 0.3 mL 04/18/2019 Intramuscular   Manufacturer: [place]   Lot: [id number]   [place]: [id number]

## 2019-07-28 ENCOUNTER — Ambulatory Visit (INDEPENDENT_AMBULATORY_CARE_PROVIDER_SITE_OTHER): Payer: Medicare Other | Admitting: Family Medicine

## 2019-07-28 ENCOUNTER — Encounter: Payer: Self-pay | Admitting: Family Medicine

## 2019-07-28 ENCOUNTER — Other Ambulatory Visit: Payer: Self-pay

## 2019-07-28 VITALS — BP 120/70 | HR 89 | Temp 98.1°F | Ht 66.5 in | Wt 241.4 lb

## 2019-07-28 DIAGNOSIS — Z6838 Body mass index (BMI) 38.0-38.9, adult: Secondary | ICD-10-CM

## 2019-07-28 DIAGNOSIS — E119 Type 2 diabetes mellitus without complications: Secondary | ICD-10-CM

## 2019-07-28 DIAGNOSIS — I10 Essential (primary) hypertension: Secondary | ICD-10-CM | POA: Diagnosis not present

## 2019-07-28 DIAGNOSIS — E6609 Other obesity due to excess calories: Secondary | ICD-10-CM | POA: Diagnosis not present

## 2019-07-28 DIAGNOSIS — E782 Mixed hyperlipidemia: Secondary | ICD-10-CM | POA: Diagnosis not present

## 2019-07-28 DIAGNOSIS — E1169 Type 2 diabetes mellitus with other specified complication: Secondary | ICD-10-CM | POA: Diagnosis not present

## 2019-07-28 NOTE — Assessment & Plan Note (Signed)
Disc goals for lipids and reasons to control them Rev last labs with pt Rev low sat fat diet in detail  Labs today  Continues fibrate and statin (tolerates well)

## 2019-07-28 NOTE — Assessment & Plan Note (Signed)
bp in fair control at this time  BP Readings from Last 1 Encounters:  07/28/19 120/70   No changes needed Most recent labs reviewed  Disc lifstyle change with low sodium diet and exercise

## 2019-07-28 NOTE — Progress Notes (Signed)
Subjective:    Patient ID: Daniel Bailey., male    DOB: 04-Jun-1950, 69 y.o.   MRN: [id number]  This visit occurred during the SARS-CoV-2 public health emergency.  Safety protocols were in place, including screening questions prior to the visit, additional usage of staff PPE, and extensive cleaning of exam room while observing appropriate contact time as indicated for disinfecting solutions.    HPI Pt presents for f/u of chronic medical problem Here with his cousin/caregiver [name]   Feeling ok overall  Watches TV much of the time / tries to get outside  Not walking - difficult to fit in while family cares for mother in hospice  Wt Readings from Last 3 Encounters:  07/28/19 241 lb 6 oz (109.5 kg)  10/01/18 246 lb 7 oz (111.8 kg)  03/13/18 237 lb 8 oz (107.7 kg)   38.38 kg/m   Caregiver is fitting in more salads and vegetables  Also drinking some water (not enough)   bp is stable today  No cp or palpitations or headaches or edema  No side effects to medicines  BP Readings from Last 3 Encounters:  07/28/19 120/70  10/01/18 122/68  03/13/18 124/68     Pulse Readings from Last 3 Encounters:  07/28/19 89  10/01/18 92  03/13/18 78    Controlled DM2 Lab Results  Component Value Date   HGBA1C 6.7 (A) 01/07/2019  due for re check today Taking metformin -no problems  On statin and ace  Eye exam - [facility] (he got some glasses)  No diabetic problems   Mother is in hospice at home  Comfortable at home  Not eating but drinking    Mixed hyperlipidemia Lab Results  Component Value Date   CHOL 134 09/26/2018   HDL 41.50 09/26/2018   [place] 71 09/26/2018   LDLDIRECT 175.3 08/16/2009   TRIG 109.0 09/26/2018   CHOLHDL 3 09/26/2018   Due for labs Takes fibrate and statin -tolerating well  Eating better now - has been well controlled   Had his first covid vaccine   Patient Active Problem List   Diagnosis Date Noted  . Elevated PSA 09/05/2017  .  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 Mixed hyperlipidemia due to type 2 diabetes mellitus ([place]) 02/19/2014  . Encounter for Medicare annual wellness exam 08/27/2012  . Obesity 08/18/2011  . Colon cancer screening 08/18/2011  . Routine general medical examination at a health care facility 08/10/2011  . Prostate cancer screening 08/10/2011  . Controlled type 2 diabetes mellitus without complication, without long-term current use of insulin ([place]) 05/30/2010  . HYPERTENSION, BENIGN ESSENTIAL 12/10/2006  . MENTAL RETARDATION 11/23/2006   Past Medical History:  Diagnosis Date  . Glaucoma   . Hyperglycemia   . Hyperlipidemia   . Hypertension    Past Surgical History:  Procedure Laterality Date  . COLONOSCOPY    . PROSTATE BIOPSY N/A 11/20/2017   Procedure: BIOPSY TRANSRECTAL ULTRASONIC PROSTATE (TUBP);  Surgeon: Abbie Sons, MD;  Location: [facility] ORS;  Service: Urology;  Laterality: N/A;   Social History   Tobacco Use  . Smoking status: Never Smoker  . Smokeless tobacco: Never Used  Substance Use Topics  . Alcohol use: No    Alcohol/week: 0.0 standard drinks  . Drug use: No   Family History  Problem Relation Age of Onset  . Colon cancer Mother    No Known Allergies Current Outpatient Medications on File Prior to Visit  Medication Sig Dispense Refill  .  acetaminophen (TYLENOL) 500 MG tablet Take 1,000 mg by mouth daily as needed for headache.    Marland Kitchen atorvastatin (LIPITOR) 10 MG tablet Take 1 tablet (10 mg total) by mouth daily. In the evening 90 tablet 3  . gemfibrozil (LOPID) 600 MG tablet Take 1 tablet (600 mg total) by mouth 2 (two) times daily. 180 tablet 3  . lisinopril (ZESTRIL) 5 MG tablet Take 0.5 tablets (2.5 mg total) by mouth daily. 45 tablet 3  . metFORMIN (GLUCOPHAGE-XR) 500 MG 24 hr tablet Take 1 tablet (500 mg total) by mouth daily with breakfast. 90 tablet 3   No current facility-administered medications on file prior to visit.     Review of Systems  Constitutional:  Negative for activity change, appetite change, fatigue, fever and unexpected weight change.  HENT: Negative for congestion, rhinorrhea, sore throat and trouble swallowing.   Eyes: Negative for pain, redness, itching and visual disturbance.  Respiratory: Negative for cough, chest tightness, shortness of breath and wheezing.   Cardiovascular: Negative for chest pain and palpitations.  Gastrointestinal: Negative for abdominal pain, blood in stool, constipation, diarrhea and nausea.  Endocrine: Negative for cold intolerance, heat intolerance, polydipsia and polyuria.  Genitourinary: Negative for difficulty urinating, dysuria, frequency and urgency.  Musculoskeletal: Negative for arthralgias, joint swelling and myalgias.  Skin: Negative for pallor and rash.  Neurological: Negative for dizziness, tremors, weakness, numbness and headaches.  Hematological: Negative for adenopathy. Does not bruise/bleed easily.  Psychiatric/Behavioral: Negative for decreased concentration and dysphoric mood. The patient is not nervous/anxious.        Objective:   Physical Exam Constitutional:      General: He is not in acute distress.    Appearance: Normal appearance. He is well-developed. He is obese. He is not ill-appearing or diaphoretic.  HENT:     Head: Normocephalic and atraumatic.     Mouth/Throat:     Mouth: Mucous membranes are moist.     Pharynx: No posterior oropharyngeal erythema.  Eyes:     General: No scleral icterus.    Conjunctiva/sclera: Conjunctivae normal.     Pupils: Pupils are equal, round, and reactive to light.  Neck:     Thyroid: No thyromegaly.     Vascular: No carotid bruit or JVD.  Cardiovascular:     Rate and Rhythm: Normal rate and regular rhythm.     Heart sounds: Normal heart sounds. No gallop.   Pulmonary:     Effort: Pulmonary effort is normal. No respiratory distress.     Breath sounds: Normal breath sounds. No wheezing or rales.  Abdominal:     General: Bowel sounds  are normal. There is no distension or abdominal bruit.     Palpations: Abdomen is soft. There is no mass.     Tenderness: There is no abdominal tenderness.  Musculoskeletal:     Cervical back: Normal range of motion and neck supple. No tenderness.     Right lower leg: No edema.     Left lower leg: No edema.  Lymphadenopathy:     Cervical: No cervical adenopathy.  Skin:    General: Skin is warm and dry.     Coloration: Skin is not pale.     Findings: No erythema or rash.  Neurological:     Mental Status: He is alert.     Coordination: Coordination normal.     Deep Tendon Reflexes: Reflexes are normal and symmetric. Reflexes normal.  Psychiatric:        Mood and Affect: Mood normal.  Comments: Pleasant  Baseline intellectual impairment  Answers questions appropriately            Assessment & Plan:   Problem List Items Addressed This Visit      Cardiovascular and Mediastinum   HYPERTENSION, BENIGN ESSENTIAL    bp in fair control at this time  BP Readings from Last 1 Encounters:  07/28/19 120/70   No changes needed Most recent labs reviewed  Disc lifstyle change with low sodium diet and exercise          Endocrine   Controlled type 2 diabetes mellitus without complication, without long-term current use of insulin ([place]) - Primary    Labs today for a1c and lipids  Diet has improved some but needs more exercise  Sent for his eye exam report /recent  No symptoms  On ace and statin  Continues to tolerate metformin well       Relevant Orders   Hemoglobin A1c   Mixed hyperlipidemia due to type 2 diabetes mellitus ([place])    Disc goals for lipids and reasons to control them Rev last labs with pt Rev low sat fat diet in detail  Labs today  Continues fibrate and statin (tolerates well)        Relevant Orders   Comprehensive metabolic panel   Lipid panel     Other   Obesity    Discussed how this problem influences overall health and the risks it imposes    Reviewed plan for weight loss with lower calorie diet (via better food choices and also portion control or program like weight watchers) and exercise building up to or more than 30 minutes 5 days per week including some aerobic activity

## 2019-07-28 NOTE — Assessment & Plan Note (Signed)
Labs today for a1c and lipids  Diet has improved some but needs more exercise  Sent for his eye exam report /recent  No symptoms  On ace and statin  Continues to tolerate metformin well

## 2019-07-28 NOTE — Assessment & Plan Note (Signed)
Discussed how this problem influences overall health and the risks it imposes  Reviewed plan for weight loss with lower calorie diet (via better food choices and also portion control or program like weight watchers) and exercise building up to or more than 30 minutes 5 days per week including some aerobic activity    

## 2019-07-28 NOTE — Patient Instructions (Signed)
Labs today   Take good care of your feet  Wear supportive shoes   Get out and walk more in safe areas   Follow up in 6 months for your annual exam  Also we will send for your eye exam report   Keep eating more fruits and vegetables

## 2019-07-29 LAB — COMPREHENSIVE METABOLIC PANEL
ALT: 40 U/L (ref 0–53)
AST: 42 U/L — ABNORMAL HIGH (ref 0–37)
Albumin: 4.3 g/dL (ref 3.5–5.2)
Alkaline Phosphatase: 73 U/L (ref 39–117)
BUN: 18 mg/dL (ref 6–23)
CO2: 25 mEq/L (ref 19–32)
Calcium: 9.1 mg/dL (ref 8.4–10.5)
Chloride: 102 mEq/L (ref 96–112)
Creatinine, Ser: 1.22 mg/dL (ref 0.40–1.50)
GFR: 71.15 mL/min (ref >60.00)
Glucose, Bld: 92 mg/dL (ref 70–99)
Potassium: 4.2 mEq/L (ref 3.5–5.1)
Sodium: 134 mEq/L — ABNORMAL LOW (ref 135–145)
Total Bilirubin: 0.3 mg/dL (ref 0.2–1.2)
Total Protein: 7.5 g/dL (ref 6.0–8.3)

## 2019-07-29 LAB — LIPID PANEL
Cholesterol: 156 mg/dL (ref 0–200)
HDL: 33.8 mg/dL — ABNORMAL LOW (ref >39.00)
NonHDL: 122.53
Total CHOL/HDL Ratio: 5
Triglycerides: 228 mg/dL — ABNORMAL HIGH (ref 0.0–149.0)
VLDL: 45.6 mg/dL — ABNORMAL HIGH (ref 0.0–40.0)

## 2019-07-29 LAB — LDL CHOLESTEROL, DIRECT: Direct LDL: 96 mg/dL

## 2019-07-29 LAB — HEMOGLOBIN A1C: Hgb A1c MFr Bld: 7.1 % — ABNORMAL HIGH (ref 4.6–6.5)

## 2019-07-30 ENCOUNTER — Telehealth: Payer: Self-pay | Admitting: Family Medicine

## 2019-07-30 MED ORDER — METFORMIN HCL ER 500 MG PO TB24: 1000.0000 mg | ORAL_TABLET | Freq: Every day | ORAL | 3 refills | Status: DC

## 2019-07-30 NOTE — Telephone Encounter (Signed)
Done  I would like him to f/u in 3 months for a visit and we can do a poct A1C at that time

## 2019-07-30 NOTE — Telephone Encounter (Signed)
-----   Message from Tammi Sou, [place] sent at 07/30/2019  4:55 PM EDT ----- [name] notified of labs and Dr. Marliss Coots comments. She is okay with increasing the metformin. [facility]

## 2019-07-30 NOTE — Telephone Encounter (Signed)
Daniel Bailey notified Rx sent to pharmacy and f/u appt scheduled

## 2019-08-13 ENCOUNTER — Ambulatory Visit: Payer: Medicare Other | Attending: Internal Medicine

## 2019-08-13 DIAGNOSIS — Z23 Encounter for immunization: Secondary | ICD-10-CM

## 2019-08-13 NOTE — Progress Notes (Signed)
   [id number]  Name:  Daniel Bailey.    MRN: [id number] DOB: 1951-05-04  08/13/2019  [name] was observed post Covid-19 immunization for 15 minutes without incident. He was provided with Vaccine Information Sheet and instruction to access the V-Safe system.   [name] was instructed to call 911 with any severe reactions post vaccine: Marland Kitchen Difficulty breathing  . Swelling of face and throat  . A fast heartbeat  . A bad rash all over body  . Dizziness and weakness   Immunizations Administered    Name Date Dose VIS Date Route   Pfizer COVID-19 Vaccine 08/13/2019  4:01 PM 0.3 mL 04/18/2019 Intramuscular   Manufacturer: [place]   Lot: [id number]   [place]: [id number]

## 2019-08-21 ENCOUNTER — Telehealth: Payer: Self-pay | Admitting: Family Medicine

## 2019-08-21 NOTE — Telephone Encounter (Signed)
I spoke with Daniel Bailey (DPR signed) Daniel Bailey spoke with Dr [name] 1 year ago about pts mental capacity for legal purposes; Needs letter he is not competent to handle his affairs. The lawyer, Annye Asa or [place] will be contacting Dr [name] about making declaration that pt is not competent and about Daniel Bailey being a guardian and pt is not able to manage his affairs. Pt was seen 07/28/19 in office.Daniel Bailey said Dr [name] is aware of this. This is FYI that a lawyer is going to contact Dr [name] about this.

## 2019-08-21 NOTE — Telephone Encounter (Signed)
That is fine  I will anticipate contact and try to do what they need, thanks

## 2019-08-21 NOTE — Telephone Encounter (Signed)
Patients Cousin [name] called today requesting a call back  She stated that it is in regards to Legal paperwork. [name] would not go into great detail about the paperwork, she wanted to speak with the nurse to give further detail.

## 2019-08-25 ENCOUNTER — Telehealth: Payer: Self-pay

## 2019-08-25 NOTE — Telephone Encounter (Signed)
Error

## 2019-08-29 NOTE — Telephone Encounter (Signed)
Daniel Bailey notified letter ready for pick up. I read the letter to [name] and she said it sounds fine to he but will let us know if the lawyer needs it to say anything else

## 2019-08-29 NOTE — Telephone Encounter (Signed)
Daniel Bailey called back to check status of letter. Advised her that the message that sent back stated that the lawyers office would be reaching out to our office with what they are needing. Daniel Bailey stated she spoke with them and they advised her to call our office because they will not be reaching out  She stated that they are needing declaration that pt is not competent and about Daniel Bailey being a guardian and pt is not able to manage his affairs

## 2019-08-29 NOTE — Telephone Encounter (Signed)
The letter is done and in IN box Let me know in the future if wording needs to be changed for legal or other reasons

## 2019-10-08 ENCOUNTER — Telehealth: Payer: Self-pay

## 2019-10-08 ENCOUNTER — Ambulatory Visit: Payer: Medicare Other

## 2019-10-08 NOTE — Telephone Encounter (Signed)
Called patient to complete his Medicare visit and his caregiver [name]) had forgot about the appointment. She was still at work. Appointment was rescheduled for 10/15/2019.

## 2019-10-15 ENCOUNTER — Ambulatory Visit (INDEPENDENT_AMBULATORY_CARE_PROVIDER_SITE_OTHER): Payer: Medicare Other

## 2019-10-15 DIAGNOSIS — Z0000 Encounter for general adult medical examination without abnormal findings: Secondary | ICD-10-CM | POA: Diagnosis not present

## 2019-10-15 NOTE — Progress Notes (Signed)
Subjective:   Daniel Alberta. is a 69 y.o. male who presents for Medicare Annual/Subsequent preventive examination.  Review of Systems: N/A    I connected with the patient today by telephone and verified that I am speaking with the correct person using two identifiers. Location patient: home Location nurse: work Persons participating in the virtual visit: patient, [name] (caregiver) and nurse.   I discussed the limitations, risks, security and privacy concerns of performing an evaluation and management service by telephone and the availability of in person appointments. I also discussed with the patient that there may be a patient responsible charge related to this service. The patient expressed understanding and verbally consented to this telephonic visit.    Interactive audio and video telecommunications were attempted between this nurse and patient, however failed, due to patient having technical difficulties OR patient did not have access to video capability.  We continued and completed visit with audio only.     Cardiac Risk Factors include: advanced age (>15men, >67 women);diabetes mellitus;hypertension;dyslipidemia;male gender     Objective:    Vitals: There were no vitals taken for this visit.  There is no height or weight on file to calculate BMI.  Advanced Directives 10/15/2019 09/26/2018 11/20/2017 11/13/2017 08/29/2017 08/25/2016  Does Patient Have a Medical Advance Directive? No Yes Yes No Yes No  Type of Advance Directive - [place];Living will [place] (No Data) [place];Living will -  Does patient want to make changes to medical advance directive? - No - Guardian declined No - Patient declined No - Patient declined - -  Copy of [place] in Chart? - Yes - validated most recent copy scanned in chart (See row information) No - copy requested - No - copy requested -  Would patient like information on  creating a medical advance directive? No - Patient declined - - No - Patient declined - -    Tobacco Social History   Tobacco Use  Smoking Status Never Smoker  Smokeless Tobacco Never Used     Counseling given: Not Answered   Clinical Intake:  Pre-visit preparation completed: Yes  Pain : No/denies pain     Nutritional Risks: None Diabetes: Yes CBG done?: No Did pt. bring in CBG monitor from home?: No  How often do you need to have someone help you when you read instructions, pamphlets, or other written materials from your doctor or pharmacy?: 1 - Never What is the last grade level you completed in school?: unsure  Interpreter Needed?: No  Information entered by :: CJohnson, LPN  Past Medical History:  Diagnosis Date  . Glaucoma   . Hyperglycemia   . Hyperlipidemia   . Hypertension    Past Surgical History:  Procedure Laterality Date  . COLONOSCOPY    . PROSTATE BIOPSY N/A 11/20/2017   Procedure: BIOPSY TRANSRECTAL ULTRASONIC PROSTATE (TUBP);  Surgeon: Abbie Sons, MD;  Location: [facility] ORS;  Service: Urology;  Laterality: N/A;   Family History  Problem Relation Age of Onset  . Colon cancer Mother    Social History   Socioeconomic History  . Marital status: Married    Spouse name: Not on file  . Number of children: Not on file  . Years of education: Not on file  . Highest education level: Not on file  Occupational History  . Not on file  Tobacco Use  . Smoking status: Never Smoker  . Smokeless tobacco: Never Used  Substance  and Sexual Activity  . Alcohol use: No    Alcohol/week: 0.0 standard drinks  . Drug use: No  . Sexual activity: Not Currently  Other Topics Concern  . Not on file  Social History Narrative  . Not on file   Social Determinants of Health   Financial Resource Strain: Low Risk   . Difficulty of Paying Living Expenses: Not hard at all  Food Insecurity: No Food Insecurity  . Worried About Charity fundraiser in the Last  Year: Never true  . Ran Out of Food in the Last Year: Never true  Transportation Needs: No Transportation Needs  . Lack of Transportation (Medical): No  . Lack of Transportation (Non-Medical): No  Physical Activity: Inactive  . Days of Exercise per Week: 0 days  . Minutes of Exercise per Session: 0 min  Stress: No Stress Concern Present  . Feeling of Stress : Not at all  Social Connections:   . Frequency of Communication with Friends and Family:   . Frequency of Social Gatherings with Friends and Family:   . Attends Religious Services:   . Active Member of Clubs or Organizations:   . Attends Archivist Meetings:   Marland Kitchen Marital Status:     Outpatient Encounter Medications as of 10/15/2019  Medication Sig  . acetaminophen (TYLENOL) 500 MG tablet Take 1,000 mg by mouth daily as needed for headache.  Marland Kitchen atorvastatin (LIPITOR) 10 MG tablet Take 1 tablet (10 mg total) by mouth daily. In the evening  . gemfibrozil (LOPID) 600 MG tablet Take 1 tablet (600 mg total) by mouth 2 (two) times daily.  Marland Kitchen lisinopril (ZESTRIL) 5 MG tablet Take 0.5 tablets (2.5 mg total) by mouth daily.  . metFORMIN (GLUCOPHAGE-XR) 500 MG 24 hr tablet Take 2 tablets (1,000 mg total) by mouth daily with breakfast.   No facility-administered encounter medications on file as of 10/15/2019.    Activities of Daily Living In your present state of health, do you have any difficulty performing the following activities: 10/15/2019  Hearing? N  Vision? N  Difficulty concentrating or making decisions? Y  Comment small things only  Walking or climbing stairs? N  Dressing or bathing? N  Doing errands, shopping? N  Preparing Food and eating ? N  Using the Toilet? N  In the past six months, have you accidently leaked urine? N  Do you have problems with loss of bowel control? N  Managing your Medications? N  Managing your Finances? Y  Comment cousin helps with this  Housekeeping or managing your Housekeeping? N  Some  recent data might be hidden    Patient Care Team: Tower, Wynelle Fanny, MD as PCP - General   Assessment:   This is a routine wellness examination for Daniel Bailey.  Exercise Activities and Dietary recommendations Current Exercise Habits: The patient does not participate in regular exercise at present, Exercise limited by: None identified  Goals    . Patient Stated     Starting 09/26/2018, I will continue to take medications as prescribed.     . Patient Stated     10/15/2019, I will maintain and continue medications as prescribed.        Fall Risk Fall Risk  10/15/2019 09/26/2018 08/29/2017 08/25/2016 08/25/2015  Falls in the past year? 0 0 No No No  Number falls in past yr: 0 - - - -  Injury with Fall? 0 - - - -  Risk for fall due to : Medication side effect - - - -  Follow up Falls evaluation completed;Falls prevention discussed - - - -   Is the patient's home free of loose throw rugs in walkways, pet beds, electrical cords, etc?   yes      Grab bars in the bathroom? yes      Handrails on the stairs?   yes      Adequate lighting?   yes  Timed Get Up and Go Performed: N/A  Depression Screen PHQ 2/9 Scores 10/15/2019 09/26/2018 08/29/2017 08/25/2016  PHQ - 2 Score 0 0 0 0  PHQ- 9 Score 0 0 0 -    Cognitive Function MMSE - Mini Mental State Exam 10/15/2019 09/26/2018 08/29/2017 08/25/2016  Not completed: Unable to complete Unable to complete Unable to complete Unable to complete  Mini Cog  Mini-Cog screen was not completed. Maximum score is 22. A value of 0 denotes this part of the MMSE was not completed or the patient failed this part of the Mini-Cog screening.       Immunization History  Administered Date(s) Administered  . Fluad Quad(high Dose 65+) 01/07/2019  . Influenza Split 02/19/2012  . Influenza,inj,Quad PF,6+ Mos 02/28/2013, 02/18/2014, 02/22/2015, 02/25/2016, 03/02/2017, 03/13/2018  . PFIZER SARS-COV-2 Vaccination 07/13/2019, 08/13/2019  . Pneumococcal Conjugate-13 08/25/2015  .  Pneumococcal Polysaccharide-23 08/25/2016  . Td 02/03/1997, 12/10/2006, 11/23/2009    Qualifies for Shingles Vaccine: Yes  Screening Tests Health Maintenance  Topic Date Due  . OPHTHALMOLOGY EXAM  05/07/2020 (Originally 01/08/1961)  . TETANUS/TDAP  11/24/2019  . INFLUENZA VACCINE  12/07/2019  . HEMOGLOBIN A1C  01/28/2020  . FOOT EXAM  07/27/2020  . COLONOSCOPY  04/10/2022  . COVID-19 Vaccine  Completed  . Hepatitis C Screening  Completed  . PNA vac Low Risk Adult  Completed   Cancer Screenings: Lung: Low Dose CT Chest recommended if Age 48-80 years, 30 pack-year currently smoking OR have quit w/in 15 years. Patient does not qualify. Colorectal: completed 04/10/2012  Additional Screenings:  Hepatitis C Screening: 08/25/2016      Plan:    Patient will maintain and continue medications as prescribed.   I have personally reviewed and noted the following in the patient's chart:   . Medical and social history . Use of alcohol, tobacco or illicit drugs  . Current medications and supplements . Functional ability and status . Nutritional status . Physical activity . Advanced directives . List of other physicians . Hospitalizations, surgeries, and ER visits in previous 12 months . Vitals . Screenings to include cognitive, depression, and falls . Referrals and appointments  In addition, I have reviewed and discussed with patient certain preventive protocols, quality metrics, and best practice recommendations. A written personalized care plan for preventive services as well as general preventive health recommendations were provided to patient.     Andrez Grime, LPN  06/11/8248

## 2019-10-15 NOTE — Progress Notes (Signed)
PCP notes:  Health Maintenance: No gaps noted   Abnormal Screenings: none   Patient concerns: none   Nurse concerns: none   Next PCP appt.: 10/24/2019 @ 4 pm

## 2019-10-15 NOTE — Patient Instructions (Signed)
Daniel Bailey , Thank you for taking time to come for your Medicare Wellness Visit. I appreciate your ongoing commitment to your health goals. Please review the following plan we discussed and let me know if I can assist you in the future.   Screening recommendations/referrals: Colonoscopy: Up to date, completed 04/10/2012 Recommended yearly ophthalmology/optometry visit for glaucoma screening and checkup Recommended yearly dental visit for hygiene and checkup  Vaccinations: Influenza vaccine: Up to date, completed 01/07/2019 Pneumococcal vaccine: Completed series Tdap vaccine: Up to date, completed 11/23/2009 Shingles vaccine: discussed    Advanced directives: Advance directive discussed with you today. Even though you declined this today please call our office should you change your mind and we can give you the proper paperwork for you to fill out.   Conditions/risks identified: diabetes, hypertension, hyperlipidemia  Next appointment: 10/24/2019 @ 4 pm   Preventive Care 65 Years and Older, Male Preventive care refers to lifestyle choices and visits with your health care provider that can promote health and wellness. What does preventive care include?  A yearly physical exam. This is also called an annual well check.  Dental exams once or twice a year.  Routine eye exams. Ask your health care provider how often you should have your eyes checked.  Personal lifestyle choices, including:  Daily care of your teeth and gums.  Regular physical activity.  Eating a healthy diet.  Avoiding tobacco and drug use.  Limiting alcohol use.  Practicing safe sex.  Taking low doses of aspirin every day.  Taking vitamin and mineral supplements as recommended by your health care provider. What happens during an annual well check? The services and screenings done by your health care provider during your annual well check will depend on your age, overall health, lifestyle risk factors, and family  history of disease. Counseling  Your health care provider may ask you questions about your:  Alcohol use.  Tobacco use.  Drug use.  Emotional well-being.  Home and relationship well-being.  Sexual activity.  Eating habits.  History of falls.  Memory and ability to understand (cognition).  Work and work Statistician. Screening  You may have the following tests or measurements:  Height, weight, and BMI.  Blood pressure.  Lipid and cholesterol levels. These may be checked every 5 years, or more frequently if you are over 62 years old.  Skin check.  Lung cancer screening. You may have this screening every year starting at age 54 if you have a 30-pack-year history of smoking and currently smoke or have quit within the past 15 years.  Fecal occult blood test (FOBT) of the stool. You may have this test every year starting at age 51.  Flexible sigmoidoscopy or colonoscopy. You may have a sigmoidoscopy every 5 years or a colonoscopy every 10 years starting at age 84.  Prostate cancer screening. Recommendations will vary depending on your family history and other risks.  Hepatitis C blood test.  Hepatitis B blood test.  Sexually transmitted disease (STD) testing.  Diabetes screening. This is done by checking your blood sugar (glucose) after you have not eaten for a while (fasting). You may have this done every 1-3 years.  Abdominal aortic aneurysm (AAA) screening. You may need this if you are a current or former smoker.  Osteoporosis. You may be screened starting at age 74 if you are at high risk. Talk with your health care provider about your test results, treatment options, and if necessary, the need for more tests. Vaccines  Your  health care provider may recommend certain vaccines, such as:  Influenza vaccine. This is recommended every year.  Tetanus, diphtheria, and acellular pertussis (Tdap, Td) vaccine. You may need a Td booster every 10 years.  Zoster vaccine.  You may need this after age 72.  Pneumococcal 13-valent conjugate (PCV13) vaccine. One dose is recommended after age 31.  Pneumococcal polysaccharide (PPSV23) vaccine. One dose is recommended after age 69. Talk to your health care provider about which screenings and vaccines you need and how often you need them. This information is not intended to replace advice given to you by your health care provider. Make sure you discuss any questions you have with your health care provider. Document Released: 05/21/2015 Document Revised: 01/12/2016 Document Reviewed: 02/23/2015 Elsevier Interactive Patient Education  2017 [place] Prevention in the Home Falls can cause injuries. They can happen to people of all ages. There are many things you can do to make your home safe and to help prevent falls. What can I do on the outside of my home?  Regularly fix the edges of walkways and driveways and fix any cracks.  Remove anything that might make you trip as you walk through a door, such as a raised step or threshold.  Trim any bushes or trees on the path to your home.  Use bright outdoor lighting.  Clear any walking paths of anything that might make someone trip, such as rocks or tools.  Regularly check to see if handrails are loose or broken. Make sure that both sides of any steps have handrails.  Any raised decks and porches should have guardrails on the edges.  Have any leaves, snow, or ice cleared regularly.  Use sand or salt on walking paths during winter.  Clean up any spills in your garage right away. This includes oil or grease spills. What can I do in the bathroom?  Use night lights.  Install grab bars by the toilet and in the tub and shower. Do not use towel bars as grab bars.  Use non-skid mats or decals in the tub or shower.  If you need to sit down in the shower, use a plastic, non-slip stool.  Keep the floor dry. Clean up any water that spills on the floor as soon  as it happens.  Remove soap buildup in the tub or shower regularly.  Attach bath mats securely with double-sided non-slip rug tape.  Do not have throw rugs and other things on the floor that can make you trip. What can I do in the bedroom?  Use night lights.  Make sure that you have a light by your bed that is easy to reach.  Do not use any sheets or blankets that are too big for your bed. They should not hang down onto the floor.  Have a firm chair that has side arms. You can use this for support while you get dressed.  Do not have throw rugs and other things on the floor that can make you trip. What can I do in the kitchen?  Clean up any spills right away.  Avoid walking on wet floors.  Keep items that you use a lot in easy-to-reach places.  If you need to reach something above you, use a strong step stool that has a grab bar.  Keep electrical cords out of the way.  Do not use floor polish or wax that makes floors slippery. If you must use wax, use non-skid floor wax.  Do not  have throw rugs and other things on the floor that can make you trip. What can I do with my stairs?  Do not leave any items on the stairs.  Make sure that there are handrails on both sides of the stairs and use them. Fix handrails that are broken or loose. Make sure that handrails are as long as the stairways.  Check any carpeting to make sure that it is firmly attached to the stairs. Fix any carpet that is loose or worn.  Avoid having throw rugs at the top or bottom of the stairs. If you do have throw rugs, attach them to the floor with carpet tape.  Make sure that you have a light switch at the top of the stairs and the bottom of the stairs. If you do not have them, ask someone to add them for you. What else can I do to help prevent falls?  Wear shoes that:  Do not have high heels.  Have rubber bottoms.  Are comfortable and fit you well.  Are closed at the toe. Do not wear sandals.  If  you use a stepladder:  Make sure that it is fully opened. Do not climb a closed stepladder.  Make sure that both sides of the stepladder are locked into place.  Ask someone to hold it for you, if possible.  Clearly mark and make sure that you can see:  Any grab bars or handrails.  First and last steps.  Where the edge of each step is.  Use tools that help you move around (mobility aids) if they are needed. These include:  Canes.  Walkers.  Scooters.  Crutches.  Turn on the lights when you go into a dark area. Replace any light bulbs as soon as they burn out.  Set up your furniture so you have a clear path. Avoid moving your furniture around.  If any of your floors are uneven, fix them.  If there are any pets around you, be aware of where they are.  Review your medicines with your doctor. Some medicines can make you feel dizzy. This can increase your chance of falling. Ask your doctor what other things that you can do to help prevent falls. This information is not intended to replace advice given to you by your health care provider. Make sure you discuss any questions you have with your health care provider. Document Released: 02/18/2009 Document Revised: 09/30/2015 Document Reviewed: 05/29/2014 Elsevier Interactive Patient Education  2017 [employer].

## 2019-10-24 ENCOUNTER — Ambulatory Visit (INDEPENDENT_AMBULATORY_CARE_PROVIDER_SITE_OTHER): Payer: Medicare Other | Admitting: Family Medicine

## 2019-10-24 ENCOUNTER — Encounter: Payer: Self-pay | Admitting: Family Medicine

## 2019-10-24 ENCOUNTER — Other Ambulatory Visit: Payer: Self-pay

## 2019-10-24 VITALS — BP 110/78 | HR 77 | Temp 96.8°F | Ht 66.5 in | Wt 235.0 lb

## 2019-10-24 DIAGNOSIS — E782 Mixed hyperlipidemia: Secondary | ICD-10-CM

## 2019-10-24 DIAGNOSIS — Z6837 Body mass index (BMI) 37.0-37.9, adult: Secondary | ICD-10-CM

## 2019-10-24 DIAGNOSIS — E119 Type 2 diabetes mellitus without complications: Secondary | ICD-10-CM | POA: Diagnosis not present

## 2019-10-24 DIAGNOSIS — E1169 Type 2 diabetes mellitus with other specified complication: Secondary | ICD-10-CM | POA: Diagnosis not present

## 2019-10-24 DIAGNOSIS — I10 Essential (primary) hypertension: Secondary | ICD-10-CM | POA: Diagnosis not present

## 2019-10-24 DIAGNOSIS — E6609 Other obesity due to excess calories: Secondary | ICD-10-CM

## 2019-10-24 LAB — POCT GLYCOSYLATED HEMOGLOBIN (HGB A1C): Hemoglobin A1C: 6.8 % — AB (ref 4.0–5.6)

## 2019-10-24 NOTE — Patient Instructions (Addendum)
A1c is improved down to 6.8   Keep eating better !  Walk more and move more  Do more housework   [facility]   Follow up in about 6 months with labs prior

## 2019-10-24 NOTE — Progress Notes (Signed)
Subjective:    Patient ID: [name]., male    DOB: 29-Nov-1950, 69 y.o.   MRN: [id number] This visit occurred during the SARS-CoV-2 public health emergency.  Safety protocols were in place, including screening questions prior to the visit, additional usage of staff PPE, and extensive cleaning of exam room while observing appropriate contact time as indicated for disinfecting solutions.    HPI  Pt presents for f/u of diabetes   Wt Readings from Last 3 Encounters:  10/24/19 235 lb (106.6 kg)  07/28/19 241 lb 6 oz (109.5 kg)  10/01/18 246 lb 7 oz (111.8 kg)   37.36 kg/m   bp is stable today  No cp or palpitations or headaches or edema  No side effects to medicines  BP Readings from Last 3 Encounters:  10/24/19 110/78  07/28/19 120/70  10/01/18 122/68      His mother passed away since last visit  He is doing ok emotionally   He watches TV Sits on the porch as well  Walking in the house  Is planning some more exercise-needs to start walking more  [name] is taking care of him - will start walking in the driveway  Her son is staying at the house  Stays up late and sleeping late     DM2 Lab Results  Component Value Date   HGBA1C 7.1 (H) 07/28/2019   This was up so we increased metformin to xr 1000 mg daily   Lab Results  Component Value Date   HGBA1C 6.8 (A) 10/24/2019     Wt is down ! Eating mostly keto / less processed carbs  Fruit instead of sweets   Taking statin and ace   Last lipids Lab Results  Component Value Date   CHOL 156 07/28/2019   HDL 33.80 (L) 07/28/2019   LDLCALC 71 09/26/2018   LDLDIRECT 96.0 07/28/2019   TRIG 228.0 (H) 07/28/2019   CHOLHDL 5 07/28/2019   On both statin and gemfibrozil-tolerating well LDL  direct was 96  Is covid immunized   Patient Active Problem List   Diagnosis Date Noted  . Elevated PSA 09/05/2017  . 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 Mixed hyperlipidemia due to type 2 diabetes mellitus ([place]) 02/19/2014  .  Encounter for Medicare annual wellness exam 08/27/2012  . Obesity 08/18/2011  . Colon cancer screening 08/18/2011  . Routine general medical examination at a health care facility 08/10/2011  . Prostate cancer screening 08/10/2011  . Controlled type 2 diabetes mellitus without complication, without long-term current use of insulin ([place]) 05/30/2010  . HYPERTENSION, BENIGN ESSENTIAL 12/10/2006  . MENTAL RETARDATION 11/23/2006   Past Medical History:  Diagnosis Date  . Glaucoma   . Hyperglycemia   . Hyperlipidemia   . Hypertension    Past Surgical History:  Procedure Laterality Date  . COLONOSCOPY    . PROSTATE BIOPSY N/A 11/20/2017   Procedure: BIOPSY TRANSRECTAL ULTRASONIC PROSTATE (TUBP);  Surgeon: Abbie Sons, MD;  Location: [facility] ORS;  Service: Urology;  Laterality: N/A;   Social History   Tobacco Use  . Smoking status: Never Smoker  . Smokeless tobacco: Never Used  Vaping Use  . Vaping Use: Never used  Substance Use Topics  . Alcohol use: No    Alcohol/week: 0.0 standard drinks  . Drug use: No   Family History  Problem Relation Age of Onset  . Colon cancer Mother    No Known Allergies Current Outpatient Medications on File Prior to Visit  Medication Sig Dispense Refill  .  acetaminophen (TYLENOL) 500 MG tablet Take 1,000 mg by mouth daily as needed for headache.    Marland Kitchen atorvastatin (LIPITOR) 10 MG tablet Take 1 tablet (10 mg total) by mouth daily. In the evening 90 tablet 3  . gemfibrozil (LOPID) 600 MG tablet Take 1 tablet (600 mg total) by mouth 2 (two) times daily. 180 tablet 3  . lisinopril (ZESTRIL) 5 MG tablet Take 0.5 tablets (2.5 mg total) by mouth daily. 45 tablet 3  . metFORMIN (GLUCOPHAGE-XR) 500 MG 24 hr tablet Take 2 tablets (1,000 mg total) by mouth daily with breakfast. 180 tablet 3   No current facility-administered medications on file prior to visit.    Review of Systems  Constitutional: Negative for activity change, appetite change, fatigue, fever  and unexpected weight change.  HENT: Negative for congestion, rhinorrhea, sore throat and trouble swallowing.   Eyes: Negative for pain, redness, itching and visual disturbance.  Respiratory: Negative for cough, chest tightness, shortness of breath and wheezing.   Cardiovascular: Negative for chest pain and palpitations.  Gastrointestinal: Negative for abdominal pain, blood in stool, constipation, diarrhea and nausea.  Endocrine: Negative for cold intolerance, heat intolerance, polydipsia and polyuria.  Genitourinary: Negative for difficulty urinating, dysuria, frequency and urgency.  Musculoskeletal: Negative for arthralgias, joint swelling and myalgias.  Skin: Negative for pallor and rash.  Neurological: Negative for dizziness, tremors, weakness, numbness and headaches.  Hematological: Negative for adenopathy. Does not bruise/bleed easily.  Psychiatric/Behavioral: Negative for decreased concentration and dysphoric mood. The patient is not nervous/anxious.        Objective:   Physical Exam Constitutional:      General: He is not in acute distress.    Appearance: Normal appearance. He is well-developed. He is obese. He is not ill-appearing.  HENT:     Head: Normocephalic and atraumatic.  Eyes:     Conjunctiva/sclera: Conjunctivae normal.     Pupils: Pupils are equal, round, and reactive to light.  Neck:     Thyroid: No thyromegaly.     Vascular: No carotid bruit or JVD.  Cardiovascular:     Rate and Rhythm: Normal rate and regular rhythm.     Heart sounds: Normal heart sounds. No gallop.   Pulmonary:     Effort: Pulmonary effort is normal. No respiratory distress.     Breath sounds: Normal breath sounds. No wheezing or rales.  Abdominal:     General: Bowel sounds are normal. There is no distension or abdominal bruit.     Palpations: Abdomen is soft. There is no mass.     Tenderness: There is no abdominal tenderness.  Musculoskeletal:     Cervical back: Normal range of motion  and neck supple.  Lymphadenopathy:     Cervical: No cervical adenopathy.  Skin:    General: Skin is warm and dry.     Coloration: Skin is not pale.     Findings: No erythema or rash.  Neurological:     Mental Status: He is alert.     Sensory: No sensory deficit.     Coordination: Coordination normal.     Deep Tendon Reflexes: Reflexes are normal and symmetric. Reflexes normal.  Psychiatric:     Comments: Baseline intellectually impaired            Assessment & Plan:   Problem List Items Addressed This Visit      Cardiovascular and Mediastinum   HYPERTENSION, BENIGN ESSENTIAL    bp in fair control at this time  BP Readings from  Last 1 Encounters:  10/24/19 110/78   No changes needed Most recent labs reviewed  Disc lifstyle change with low sodium diet and exercise  Enc further wt loss         Endocrine   Controlled type 2 diabetes mellitus without complication, without long-term current use of insulin ([place]) - Primary    With inc in metformin and better diet- improved Lab Results  Component Value Date   HGBA1C 6.8 (A) 10/24/2019   Enc to keep working on low glycemic diet  Disc ways to add more activity       Relevant Orders   POCT glycosylated hemoglobin (Hb A1C) (Completed)   Mixed hyperlipidemia due to type 2 diabetes mellitus ([place])    Disc goals for lipids and reasons to control them Rev last labs with pt Rev low sat fat diet in detail Tolerating statin and gemfibrozil  LDL of 96          Other   Obesity    Discussed how this problem influences overall health and the risks it imposes  Reviewed plan for weight loss with lower calorie diet (via better food choices and also portion control or program like weight watchers) and exercise building up to or more than 30 minutes 5 days per week including some aerobic activity   Commended on beginning to loose wt

## 2019-10-25 NOTE — Assessment & Plan Note (Signed)
With inc in metformin and better diet- improved Lab Results  Component Value Date   HGBA1C 6.8 (A) 10/24/2019   Enc to keep working on low glycemic diet  Disc ways to add more activity

## 2019-10-25 NOTE — Assessment & Plan Note (Signed)
Disc goals for lipids and reasons to control them Rev last labs with pt Rev low sat fat diet in detail Tolerating statin and gemfibrozil  LDL of 96

## 2019-10-25 NOTE — Assessment & Plan Note (Signed)
Discussed how this problem influences overall health and the risks it imposes  Reviewed plan for weight loss with lower calorie diet (via better food choices and also portion control or program like weight watchers) and exercise building up to or more than 30 minutes 5 days per week including some aerobic activity   Commended on beginning to loose wt

## 2019-10-25 NOTE — Assessment & Plan Note (Signed)
bp in fair control at this time  BP Readings from Last 1 Encounters:  10/24/19 110/78   No changes needed Most recent labs reviewed  Disc lifstyle change with low sodium diet and exercise  Enc further wt loss

## 2019-10-26 ENCOUNTER — Other Ambulatory Visit: Payer: Self-pay | Admitting: Family Medicine

## 2019-11-13 ENCOUNTER — Other Ambulatory Visit: Payer: Self-pay | Admitting: Family Medicine

## 2019-12-25 DIAGNOSIS — M79674 Pain in right toe(s): Secondary | ICD-10-CM | POA: Diagnosis not present

## 2019-12-25 DIAGNOSIS — B351 Tinea unguium: Secondary | ICD-10-CM | POA: Diagnosis not present

## 2019-12-25 DIAGNOSIS — M79675 Pain in left toe(s): Secondary | ICD-10-CM | POA: Diagnosis not present

## 2020-01-20 ENCOUNTER — Telehealth: Payer: Self-pay | Admitting: *Deleted

## 2020-01-20 NOTE — Telephone Encounter (Signed)
Pt said since phone call here she got in touch with Adult services. They advise her there is a form that she has to print out that will ask what pt can do or need assistance with. She will complete that and have PCP fill out anything they ask her on the form and once they receive the form back then they will decide with level of care pt needs. Pt will drop off form at the front desk once she prints it out and reviews it. FYI to PCP

## 2020-01-20 NOTE — Telephone Encounter (Signed)
Patient's cousin [name] called stating that she is working on trying to get him in a group home. [name] stated that she is being asked what level of care he will need in the group home. [name] wants to know what Dr. [name] recommends or how she should answer that question.

## 2020-01-20 NOTE — Telephone Encounter (Signed)
Aware- I will watch out for it

## 2020-01-20 NOTE — Telephone Encounter (Signed)
She needs to let them know what he needs help with and what he is independent with -re: ADLs (dressing/food prep/bathing /medication Oncologist)   and how social he would tend to be  I'm unsure what level of care that would be labeled as exactly  I would not imagine he needs nursing or memory care

## 2020-01-28 ENCOUNTER — Ambulatory Visit (INDEPENDENT_AMBULATORY_CARE_PROVIDER_SITE_OTHER): Payer: Medicare Other | Admitting: Family Medicine

## 2020-01-28 ENCOUNTER — Other Ambulatory Visit: Payer: Self-pay

## 2020-01-28 ENCOUNTER — Encounter: Payer: Self-pay | Admitting: Family Medicine

## 2020-01-28 VITALS — BP 122/74 | HR 81 | Temp 97.2°F | Ht 66.5 in | Wt 221.1 lb

## 2020-01-28 DIAGNOSIS — Z6835 Body mass index (BMI) 35.0-35.9, adult: Secondary | ICD-10-CM

## 2020-01-28 DIAGNOSIS — Z125 Encounter for screening for malignant neoplasm of prostate: Secondary | ICD-10-CM

## 2020-01-28 DIAGNOSIS — I10 Essential (primary) hypertension: Secondary | ICD-10-CM

## 2020-01-28 DIAGNOSIS — E782 Mixed hyperlipidemia: Secondary | ICD-10-CM | POA: Diagnosis not present

## 2020-01-28 DIAGNOSIS — E6609 Other obesity due to excess calories: Secondary | ICD-10-CM | POA: Diagnosis not present

## 2020-01-28 DIAGNOSIS — F79 Unspecified intellectual disabilities: Secondary | ICD-10-CM

## 2020-01-28 DIAGNOSIS — E1169 Type 2 diabetes mellitus with other specified complication: Secondary | ICD-10-CM | POA: Diagnosis not present

## 2020-01-28 DIAGNOSIS — E119 Type 2 diabetes mellitus without complications: Secondary | ICD-10-CM

## 2020-01-28 DIAGNOSIS — R972 Elevated prostate specific antigen [PSA]: Secondary | ICD-10-CM

## 2020-01-28 DIAGNOSIS — Z23 Encounter for immunization: Secondary | ICD-10-CM

## 2020-01-28 NOTE — Assessment & Plan Note (Signed)
bp in fair control at this time  BP Readings from Last 1 Encounters:  01/28/20 122/74   No changes needed-continues lisinopril Most recent labs reviewed  Disc lifstyle change with low sodium diet and exercise

## 2020-01-28 NOTE — Assessment & Plan Note (Signed)
Eating better and loosing weight  Enc to exercise more  A1C today  On ace and statin  Sees podiatry-good foot care  Continues metformin  Eye exam is due next mo

## 2020-01-28 NOTE — Assessment & Plan Note (Signed)
Last prostate bx were normal  ? Last urology f/u -poss 2019 Check psa today No c/o re: urination

## 2020-01-28 NOTE — Progress Notes (Signed)
Subjective:    Patient ID: Daniel Bailey., male    DOB: 24-Jun-1950, 69 y.o.   MRN: [id number]  This visit occurred during the SARS-CoV-2 public health emergency.  Safety protocols were in place, including screening questions prior to the visit, additional usage of staff PPE, and extensive cleaning of exam room while observing appropriate contact time as indicated for disinfecting solutions.    HPI Pt presents for annual f/u of chronic health problems   Wt Readings from Last 3 Encounters:  01/28/20 221 lb 2 oz (100.3 kg)  10/24/19 235 lb (106.6 kg)  07/28/19 241 lb 6 oz (109.5 kg)   35.16 kg/m Wt loss  Eating habits have changed - more fruits and veg and less fried food and carryout  Cut way back on soda  Very limited sweets   Plans to start some driveway walking when weather cools down    He had amw on 6/9-no gaps noted  Doing ok overall    Getting ready to move into a group home (has not found any yet)  Needs to bring in paperwork  No urine or stool incontinence  No hx of seizures  Looking forward to group activity   Td 7/11-due for that  Flu shot -due  Is vaccinated for covid   Eye exam-had eye exam at [facility] 10/20   Colonoscopy 12/13   Prostate health  Lab Results  Component Value Date   PSA1 4.6 (H) 10/11/2017   PSA 7.62 (H) 08/29/2017   PSA 1.11 08/25/2016   PSA 0.95 08/18/2015   Has been to urology for elevated psa  Has not been back to urology-had neg biopsies   HTN bp is stable today  No cp or palpitations or headaches or edema  No side effects to medicines  BP Readings from Last 3 Encounters:  01/28/20 122/74  10/24/19 110/78  07/28/19 120/70     Pulse Readings from Last 3 Encounters:  01/28/20 81  10/24/19 77  07/28/19 89   DM2 Lab Results  Component Value Date   HGBA1C 6.8 (A) 10/24/2019   Needs labs today   Hyperlipidemia  Statin and gemfibrozil Lab Results  Component Value Date   CHOL 156 07/28/2019   HDL 33.80 (L)  07/28/2019   LDLCALC 71 09/26/2018   LDLDIRECT 96.0 07/28/2019   TRIG 228.0 (H) 07/28/2019   CHOLHDL 5 07/28/2019    Patient Active Problem List   Diagnosis Date Noted  . Elevated PSA 09/05/2017  . 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 Mixed hyperlipidemia due to type 2 diabetes mellitus ([place]) 02/19/2014  . Encounter for Medicare annual wellness exam 08/27/2012  . Obesity 08/18/2011  . Colon cancer screening 08/18/2011  . Routine general medical examination at a health care facility 08/10/2011  . Prostate cancer screening 08/10/2011  . Controlled type 2 diabetes mellitus without complication, without long-term current use of insulin ([place]) 05/30/2010  . HYPERTENSION, BENIGN ESSENTIAL 12/10/2006  . MENTAL RETARDATION 11/23/2006   Past Medical History:  Diagnosis Date  . Glaucoma   . Hyperglycemia   . Hyperlipidemia   . Hypertension    Past Surgical History:  Procedure Laterality Date  . COLONOSCOPY    . PROSTATE BIOPSY N/A 11/20/2017   Procedure: BIOPSY TRANSRECTAL ULTRASONIC PROSTATE (TUBP);  Surgeon: Abbie Sons, MD;  Location: [facility] ORS;  Service: Urology;  Laterality: N/A;   Social History   Tobacco Use  . Smoking status: Never Smoker  . Smokeless tobacco: Never Used  Vaping Use  . Vaping  Use: Never used  Substance Use Topics  . Alcohol use: No    Alcohol/week: 0.0 standard drinks  . Drug use: No   Family History  Problem Relation Age of Onset  . Colon cancer Mother    No Known Allergies Current Outpatient Medications on File Prior to Visit  Medication Sig Dispense Refill  . acetaminophen (TYLENOL) 500 MG tablet Take 1,000 mg by mouth daily as needed for headache.    Marland Kitchen atorvastatin (LIPITOR) 10 MG tablet TAKE 1 TABLET (10 MG TOTAL) BY MOUTH DAILY. IN THE EVENING 90 tablet 1  . gemfibrozil (LOPID) 600 MG tablet TAKE 1 TABLET BY MOUTH TWICE A DAY 180 tablet 1  . lisinopril (ZESTRIL) 5 MG tablet TAKE 1/2 TABLET BY MOUTH DAILY 45 tablet 3  . metFORMIN (GLUCOPHAGE-XR)  500 MG 24 hr tablet Take 2 tablets (1,000 mg total) by mouth daily with breakfast. 180 tablet 3   No current facility-administered medications on file prior to visit.    Review of Systems  Constitutional: Negative for activity change, appetite change, fatigue, fever and unexpected weight change.  HENT: Negative for congestion, rhinorrhea, sore throat and trouble swallowing.   Eyes: Negative for pain, redness, itching and visual disturbance.  Respiratory: Negative for cough, chest tightness, shortness of breath and wheezing.   Cardiovascular: Negative for chest pain and palpitations.  Gastrointestinal: Negative for abdominal pain, blood in stool, constipation, diarrhea and nausea.  Endocrine: Negative for cold intolerance, heat intolerance, polydipsia and polyuria.  Genitourinary: Negative for difficulty urinating, dysuria, frequency and urgency.  Musculoskeletal: Negative for arthralgias, joint swelling and myalgias.  Skin: Negative for pallor and rash.  Neurological: Negative for dizziness, tremors, weakness, numbness and headaches.  Hematological: Negative for adenopathy. Does not bruise/bleed easily.  Psychiatric/Behavioral: Negative for decreased concentration and dysphoric mood. The patient is not nervous/anxious.        Objective:   Physical Exam Constitutional:      General: He is not in acute distress.    Appearance: Normal appearance. He is well-developed. He is obese. He is not ill-appearing or diaphoretic.  HENT:     Head: Normocephalic and atraumatic.     Right Ear: Tympanic membrane, ear canal and external ear normal.     Left Ear: Tympanic membrane, ear canal and external ear normal.     Nose: Nose normal. No congestion.     Mouth/Throat:     Mouth: Mucous membranes are moist.     Pharynx: Oropharynx is clear. No posterior oropharyngeal erythema.  Eyes:     General: No scleral icterus.       Right eye: No discharge.        Left eye: No discharge.      Conjunctiva/sclera: Conjunctivae normal.     Pupils: Pupils are equal, round, and reactive to light.  Neck:     Thyroid: No thyromegaly.     Vascular: No carotid bruit or JVD.  Cardiovascular:     Rate and Rhythm: Normal rate and regular rhythm.     Pulses: Normal pulses.     Heart sounds: Normal heart sounds. No gallop.   Pulmonary:     Effort: Pulmonary effort is normal. No respiratory distress.     Breath sounds: Normal breath sounds. No wheezing or rales.     Comments: Good air exch Chest:     Chest wall: No tenderness.  Abdominal:     General: Bowel sounds are normal. There is no distension or abdominal bruit.  Palpations: Abdomen is soft. There is no mass.     Tenderness: There is no abdominal tenderness.     Hernia: No hernia is present.  Musculoskeletal:        General: No tenderness.     Cervical back: Normal range of motion and neck supple. No rigidity. No muscular tenderness.     Right lower leg: No edema.     Left lower leg: No edema.  Lymphadenopathy:     Cervical: No cervical adenopathy.  Skin:    General: Skin is warm and dry.     Coloration: Skin is not pale.     Findings: No erythema or rash.     Comments: Lentigines and skin tags-trunk  Neurological:     Mental Status: He is alert.     Cranial Nerves: No cranial nerve deficit.     Sensory: No sensory deficit.     Motor: No abnormal muscle tone.     Coordination: Coordination normal.     Gait: Gait normal.     Deep Tendon Reflexes: Reflexes are normal and symmetric. Reflexes normal.  Psychiatric:        Mood and Affect: Mood normal.        Cognition and Memory: Cognition normal.     Comments: Baseline intellectual impairment  Pleasant  Content             Assessment & Plan:   Problem List Items Addressed This Visit      Cardiovascular and Mediastinum   HYPERTENSION, BENIGN ESSENTIAL    bp in fair control at this time  BP Readings from Last 1 Encounters:  01/28/20 122/74   No changes  needed-continues lisinopril Most recent labs reviewed  Disc lifstyle change with low sodium diet and exercise        Relevant Orders   CBC with Differential/Platelet   Lipid panel   Comprehensive metabolic panel   TSH     Endocrine   Controlled type 2 diabetes mellitus without complication, without long-term current use of insulin (HCC) - Primary    Eating better and loosing weight  Enc to exercise more  A1C today  On ace and statin  Sees podiatry-good foot care  Continues metformin  Eye exam is due next mo      Relevant Orders   Hemoglobin A1c   Mixed hyperlipidemia due to type 2 diabetes mellitus ([place])    Disc goals for lipids and reasons to control them Rev last labs with pt Rev low sat fat diet in detail Labs today  Taking gemfibrozil and statin-no side eff Diet has improved        Other   Prostate cancer screening    psa today  No clinical changes Elevated in the past -then normal bx  ? Last urology visit       Relevant Orders   PSA, Medicare   Obesity    Discussed how this problem influences overall health and the risks it imposes  Reviewed plan for weight loss with lower calorie diet (via better food choices and also portion control or program like weight watchers) and exercise building up to or more than 30 minutes 5 days per week including some aerobic activity   Commended on wt loss so far      Elevated PSA    Last prostate bx were normal  ? Last urology f/u -poss 2019 Check psa today No c/o re: urination       Other Visit Diagnoses  Need for Td vaccine       Relevant Orders   Td : Tetanus/diphtheria >7yo Preservative  free (Completed)   Need for influenza vaccination       Relevant Orders   Flu Vaccine QUAD High Dose(Fluad) (Completed)

## 2020-01-28 NOTE — Assessment & Plan Note (Signed)
Discussed how this problem influences overall health and the risks it imposes  Reviewed plan for weight loss with lower calorie diet (via better food choices and also portion control or program like weight watchers) and exercise building up to or more than 30 minutes 5 days per week including some aerobic activity   Commended on wt loss so far  

## 2020-01-28 NOTE — Assessment & Plan Note (Signed)
Disc goals for lipids and reasons to control them Rev last labs with pt Rev low sat fat diet in detail Labs today  Taking gemfibrozil and statin-no side eff Diet has improved

## 2020-01-28 NOTE — Patient Instructions (Addendum)
Plan eye exam for next month   Td and flu shots today   Labs today   Keep working on [employer] job with weight loss! Start walking when you can

## 2020-01-28 NOTE — Assessment & Plan Note (Signed)
Looking to move into a group home in the next year since his mother has died

## 2020-01-28 NOTE — Assessment & Plan Note (Signed)
psa today  No clinical changes Elevated in the past -then normal bx  ? Last urology visit

## 2020-01-29 LAB — CBC WITH DIFFERENTIAL/PLATELET
Basophils Absolute: 0 10*3/uL (ref 0.0–0.1)
Basophils Relative: 0.5 % (ref 0.0–3.0)
Eosinophils Absolute: 0.1 10*3/uL (ref 0.0–0.7)
Eosinophils Relative: 1.8 % (ref 0.0–5.0)
HCT: 40.3 % (ref 39.0–52.0)
Hemoglobin: 13.5 g/dL (ref 13.0–17.0)
Lymphocytes Relative: 39.6 % (ref 12.0–46.0)
Lymphs Abs: 1.6 10*3/uL (ref 0.7–4.0)
MCHC: 33.5 g/dL (ref 30.0–36.0)
MCV: 90.2 fl (ref 78.0–100.0)
Monocytes Absolute: 0.4 10*3/uL (ref 0.1–1.0)
Monocytes Relative: 9.2 % (ref 3.0–12.0)
Neutro Abs: 2 10*3/uL (ref 1.4–7.7)
Neutrophils Relative %: 48.9 % (ref 43.0–77.0)
Platelets: 414 10*3/uL — ABNORMAL HIGH (ref 150.0–400.0)
RBC: 4.46 Mil/uL (ref 4.22–5.81)
RDW: 14.1 % (ref 11.5–15.5)
WBC: 4 10*3/uL (ref 4.0–10.5)

## 2020-01-29 LAB — TSH: TSH: 3.71 u[IU]/mL (ref 0.35–4.50)

## 2020-01-29 LAB — COMPREHENSIVE METABOLIC PANEL
ALT: 19 U/L (ref 0–53)
AST: 24 U/L (ref 0–37)
Albumin: 4.7 g/dL (ref 3.5–5.2)
Alkaline Phosphatase: 64 U/L (ref 39–117)
BUN: 13 mg/dL (ref 6–23)
CO2: 28 mEq/L (ref 19–32)
Calcium: 10.1 mg/dL (ref 8.4–10.5)
Chloride: 100 mEq/L (ref 96–112)
Creatinine, Ser: 1.15 mg/dL (ref 0.40–1.50)
GFR: 76.06 mL/min (ref >60.00)
Glucose, Bld: 88 mg/dL (ref 70–99)
Potassium: 4.5 mEq/L (ref 3.5–5.1)
Sodium: 136 mEq/L (ref 135–145)
Total Bilirubin: 0.5 mg/dL (ref 0.2–1.2)
Total Protein: 7.7 g/dL (ref 6.0–8.3)

## 2020-01-29 LAB — LIPID PANEL
Cholesterol: 112 mg/dL (ref 0–200)
HDL: 40 mg/dL (ref >39.00)
LDL Cholesterol: 51 mg/dL (ref 0–99)
NonHDL: 72.05
Total CHOL/HDL Ratio: 3
Triglycerides: 103 mg/dL (ref 0.0–149.0)
VLDL: 20.6 mg/dL (ref 0.0–40.0)

## 2020-01-29 LAB — HEMOGLOBIN A1C: Hgb A1c MFr Bld: 6.7 % — ABNORMAL HIGH (ref 4.6–6.5)

## 2020-01-29 LAB — PSA, MEDICARE: PSA: 1.21 ng/ml (ref 0.10–4.00)

## 2020-01-30 ENCOUNTER — Encounter: Payer: Self-pay | Admitting: *Deleted

## 2020-04-15 ENCOUNTER — Other Ambulatory Visit: Payer: Self-pay | Admitting: Family Medicine

## 2020-04-15 NOTE — Telephone Encounter (Signed)
Last OV 01/28/20 Last refill 10/27/19 #90/1 Next OV 07/23/20

## 2020-04-18 ENCOUNTER — Other Ambulatory Visit: Payer: Self-pay | Admitting: Family Medicine

## 2020-04-23 ENCOUNTER — Ambulatory Visit: Payer: Medicare Other | Admitting: Family Medicine

## 2020-05-04 ENCOUNTER — Ambulatory Visit: Payer: Medicare Other | Attending: Internal Medicine

## 2020-05-04 DIAGNOSIS — Z23 Encounter for immunization: Secondary | ICD-10-CM

## 2020-05-04 NOTE — Progress Notes (Signed)
° °  [id number]  Name:  Daniel Bailey.    MRN: [id number] DOB: 1950/09/14  05/04/2020  [name] was observed post Covid-19 immunization for 15 minutes without incident. He was provided with Vaccine Information Sheet and instruction to access the V-Safe system.   [name] was instructed to call 911 with any severe reactions post vaccine:  Difficulty breathing   Swelling of face and throat   A fast heartbeat   A bad rash all over body   Dizziness and weakness   Immunizations Administered    Name Date Dose VIS Date Route   Pfizer COVID-19 Vaccine 05/04/2020  3:42 PM 0.3 mL 02/25/2020 Intramuscular   Manufacturer: [employer], [employer]   Lot: [id number]   NDC: [address]-0

## 2020-05-13 ENCOUNTER — Other Ambulatory Visit: Payer: Medicare Other

## 2020-07-15 ENCOUNTER — Telehealth: Payer: Self-pay | Admitting: Family Medicine

## 2020-07-15 DIAGNOSIS — E1169 Type 2 diabetes mellitus with other specified complication: Secondary | ICD-10-CM

## 2020-07-15 DIAGNOSIS — I10 Essential (primary) hypertension: Secondary | ICD-10-CM

## 2020-07-15 DIAGNOSIS — E782 Mixed hyperlipidemia: Secondary | ICD-10-CM

## 2020-07-15 DIAGNOSIS — E119 Type 2 diabetes mellitus without complications: Secondary | ICD-10-CM

## 2020-07-15 NOTE — Telephone Encounter (Signed)
-----   Message from Ellamae Sia sent at 06/28/2020 11:02 AM EST ----- Regarding: Lab orders for Friday, 3.11.22 Lab orders for a 6 month follow up appt

## 2020-07-16 ENCOUNTER — Other Ambulatory Visit (INDEPENDENT_AMBULATORY_CARE_PROVIDER_SITE_OTHER): Payer: Medicare Other

## 2020-07-16 ENCOUNTER — Other Ambulatory Visit: Payer: Self-pay

## 2020-07-16 ENCOUNTER — Other Ambulatory Visit: Payer: Medicare Other

## 2020-07-16 DIAGNOSIS — E782 Mixed hyperlipidemia: Secondary | ICD-10-CM

## 2020-07-16 DIAGNOSIS — E1169 Type 2 diabetes mellitus with other specified complication: Secondary | ICD-10-CM | POA: Diagnosis not present

## 2020-07-16 DIAGNOSIS — I10 Essential (primary) hypertension: Secondary | ICD-10-CM

## 2020-07-16 DIAGNOSIS — E119 Type 2 diabetes mellitus without complications: Secondary | ICD-10-CM | POA: Diagnosis not present

## 2020-07-16 LAB — COMPREHENSIVE METABOLIC PANEL
ALT: 21 U/L (ref 0–53)
AST: 27 U/L (ref 0–37)
Albumin: 4.3 g/dL (ref 3.5–5.2)
Alkaline Phosphatase: 77 U/L (ref 39–117)
BUN: 14 mg/dL (ref 6–23)
CO2: 26 mEq/L (ref 19–32)
Calcium: 9.5 mg/dL (ref 8.4–10.5)
Chloride: 100 mEq/L (ref 96–112)
Creatinine, Ser: 1.11 mg/dL (ref 0.40–1.50)
GFR: 67.27 mL/min (ref >60.00)
Glucose, Bld: 100 mg/dL — ABNORMAL HIGH (ref 70–99)
Potassium: 3.7 mEq/L (ref 3.5–5.1)
Sodium: 136 mEq/L (ref 135–145)
Total Bilirubin: 0.5 mg/dL (ref 0.2–1.2)
Total Protein: 7.7 g/dL (ref 6.0–8.3)

## 2020-07-16 LAB — LIPID PANEL
Cholesterol: 130 mg/dL (ref 0–200)
HDL: 51.6 mg/dL (ref >39.00)
LDL Cholesterol: 56 mg/dL (ref 0–99)
NonHDL: 78.26
Total CHOL/HDL Ratio: 3
Triglycerides: 110 mg/dL (ref 0.0–149.0)
VLDL: 22 mg/dL (ref 0.0–40.0)

## 2020-07-16 LAB — HEMOGLOBIN A1C: Hgb A1c MFr Bld: 6.4 % (ref 4.6–6.5)

## 2020-07-23 ENCOUNTER — Ambulatory Visit: Payer: Medicare Other | Admitting: Family Medicine

## 2020-08-11 ENCOUNTER — Other Ambulatory Visit: Payer: Self-pay

## 2020-08-11 ENCOUNTER — Ambulatory Visit (INDEPENDENT_AMBULATORY_CARE_PROVIDER_SITE_OTHER): Payer: Medicare Other | Admitting: Family Medicine

## 2020-08-11 ENCOUNTER — Encounter: Payer: Self-pay | Admitting: Family Medicine

## 2020-08-11 VITALS — BP 110/64 | HR 67 | Temp 96.9°F | Ht 66.5 in | Wt 203.5 lb

## 2020-08-11 DIAGNOSIS — E119 Type 2 diabetes mellitus without complications: Secondary | ICD-10-CM | POA: Diagnosis not present

## 2020-08-11 DIAGNOSIS — E6609 Other obesity due to excess calories: Secondary | ICD-10-CM

## 2020-08-11 DIAGNOSIS — E1169 Type 2 diabetes mellitus with other specified complication: Secondary | ICD-10-CM | POA: Diagnosis not present

## 2020-08-11 DIAGNOSIS — E782 Mixed hyperlipidemia: Secondary | ICD-10-CM | POA: Diagnosis not present

## 2020-08-11 DIAGNOSIS — I10 Essential (primary) hypertension: Secondary | ICD-10-CM

## 2020-08-11 DIAGNOSIS — Z6832 Body mass index (BMI) 32.0-32.9, adult: Secondary | ICD-10-CM | POA: Diagnosis not present

## 2020-08-11 MED ORDER — ATORVASTATIN CALCIUM 10 MG PO TABS: 10.0000 mg | ORAL_TABLET | Freq: Every day | ORAL | 3 refills | Status: DC

## 2020-08-11 MED ORDER — GEMFIBROZIL 600 MG PO TABS
600.0000 mg | ORAL_TABLET | Freq: Two times a day (BID) | ORAL | 3 refills | Status: DC
Start: 2020-08-11 — End: 2021-09-26

## 2020-08-11 MED ORDER — LISINOPRIL 5 MG PO TABS: 2.5000 mg | ORAL_TABLET | Freq: Every day | ORAL | 3 refills | Status: DC

## 2020-08-11 MED ORDER — METFORMIN HCL ER 500 MG PO TB24: 1000.0000 mg | ORAL_TABLET | Freq: Every day | ORAL | 3 refills | Status: DC

## 2020-08-11 NOTE — Assessment & Plan Note (Signed)
bp in fair control at this time  BP Readings from Last 1 Encounters:  08/11/20 110/64   No changes needed Most recent labs reviewed  Disc lifstyle change with low sodium diet and exercise  Plan to continue lisinopril 2.5 mg daily

## 2020-08-11 NOTE — Assessment & Plan Note (Signed)
Lab Results  Component Value Date   HGBA1C 6.4 07/16/2020   Improved Also wt loss noted Commended on better diet and more activity  Sent for last eye exam  Good foot care/podiatry  F/u 6 mo  On ace and statin

## 2020-08-11 NOTE — Progress Notes (Signed)
Subjective:    Patient ID: Daniel Hayward., male    DOB: Apr 14, 1951, 70 y.o.   MRN: [id number]  This visit occurred during the SARS-CoV-2 public health emergency.  Safety protocols were in place, including screening questions prior to the visit, additional usage of staff PPE, and extensive cleaning of exam room while observing appropriate contact time as indicated for disinfecting solutions.    HPI Pt presents for 6 mo f/u of chronic health problems   Wt Readings from Last 3 Encounters:  08/11/20 203 lb 8 oz (92.3 kg)  01/28/20 221 lb 2 oz (100.3 kg)  10/24/19 235 lb (106.6 kg)   32.35 kg/m   Lost significant weight  Getting outdoors   Feeling ok    Eating better  Eats cereal  More fruits and vegetables  Is eating a lot less junk food now   Has a nephew living with him  Much better diet  Helps sweeping floor    HTN bp is stable today  No cp or palpitations or headaches or edema  No side effects to medicines  BP Readings from Last 3 Encounters:  08/11/20 110/64  01/28/20 122/74  10/24/19 110/78     Taking lisinopril 2.5 mg daily   Lab Results  Component Value Date   CREATININE 1.11 07/16/2020   BUN 14 07/16/2020   NA 136 07/16/2020   K 3.7 07/16/2020   CL 100 07/16/2020   CO2 26 07/16/2020     DM2 Lab Results  Component Value Date   HGBA1C 6.4 07/16/2020  down from 6.7 Metformin xr 1000 mg daily  Eye exam Taking statin and ace  Sees podiatry for food care   Hyperlipidemia Lab Results  Component Value Date   CHOL 130 07/16/2020   CHOL 112 01/28/2020   CHOL 156 07/28/2019   Lab Results  Component Value Date   HDL 51.60 07/16/2020   HDL 40.00 01/28/2020   HDL 33.80 (L) 07/28/2019   Lab Results  Component Value Date   LDLCALC 56 07/16/2020   LDLCALC 51 01/28/2020   LDLCALC 71 09/26/2018   Lab Results  Component Value Date   TRIG 110.0 07/16/2020   TRIG 103.0 01/28/2020   TRIG 228.0 (H) 07/28/2019   Lab Results  Component Value  Date   CHOLHDL 3 07/16/2020   CHOLHDL 3 01/28/2020   CHOLHDL 5 07/28/2019   Lab Results  Component Value Date   LDLDIRECT 96.0 07/28/2019   LDLDIRECT 175.3 08/16/2009   Taking atorvastatin 10 mg daily  Gemfibrozil 600 mg bid  Patient Active Problem List   Diagnosis Date Noted  . Elevated PSA 09/05/2017  . 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 Mixed hyperlipidemia due to type 2 diabetes mellitus ([place]) 02/19/2014  . Encounter for Medicare annual wellness exam 08/27/2012  . Obesity 08/18/2011  . Colon cancer screening 08/18/2011  . Routine general medical examination at a health care facility 08/10/2011  . Prostate cancer screening 08/10/2011  . Controlled type 2 diabetes mellitus without complication, without long-term current use of insulin ([place]) 05/30/2010  . HYPERTENSION, BENIGN ESSENTIAL 12/10/2006  . Intellectual disability 11/23/2006   Past Medical History:  Diagnosis Date  . Glaucoma   . Hyperglycemia   . Hyperlipidemia   . Hypertension    Past Surgical History:  Procedure Laterality Date  . COLONOSCOPY    . PROSTATE BIOPSY N/A 11/20/2017   Procedure: BIOPSY TRANSRECTAL ULTRASONIC PROSTATE (TUBP);  Surgeon: Abbie Sons, MD;  Location: [facility] ORS;  Service: Urology;  Laterality:  N/A;   Social History   Tobacco Use  . Smoking status: Never Smoker  . Smokeless tobacco: Never Used  Vaping Use  . Vaping Use: Never used  Substance Use Topics  . Alcohol use: No    Alcohol/week: 0.0 standard drinks  . Drug use: No   Family History  Problem Relation Age of Onset  . Colon cancer Mother    No Known Allergies Current Outpatient Medications on File Prior to Visit  Medication Sig Dispense Refill  . acetaminophen (TYLENOL) 500 MG tablet Take 1,000 mg by mouth daily as needed for headache.     No current facility-administered medications on file prior to visit.     Review of Systems  Constitutional: Negative for activity change, appetite change, fatigue, fever and  unexpected weight change.  HENT: Negative for congestion, rhinorrhea, sore throat and trouble swallowing.   Eyes: Negative for pain, redness, itching and visual disturbance.  Respiratory: Negative for cough, chest tightness, shortness of breath and wheezing.   Cardiovascular: Negative for chest pain and palpitations.  Gastrointestinal: Negative for abdominal pain, blood in stool, constipation, diarrhea and nausea.  Endocrine: Negative for cold intolerance, heat intolerance, polydipsia and polyuria.  Genitourinary: Negative for difficulty urinating, dysuria, frequency and urgency.  Musculoskeletal: Negative for arthralgias, joint swelling and myalgias.  Skin: Negative for pallor and rash.  Neurological: Negative for dizziness, tremors, weakness, numbness and headaches.  Hematological: Negative for adenopathy. Does not bruise/bleed easily.  Psychiatric/Behavioral: Negative for decreased concentration and dysphoric mood. The patient is not nervous/anxious.        Objective:   Physical Exam Constitutional:      General: He is not in acute distress.    Appearance: Normal appearance. He is well-developed. He is obese. He is not ill-appearing or diaphoretic.  HENT:     Head: Normocephalic and atraumatic.  Eyes:     Conjunctiva/sclera: Conjunctivae normal.     Pupils: Pupils are equal, round, and reactive to light.  Neck:     Thyroid: No thyromegaly.     Vascular: No carotid bruit or JVD.  Cardiovascular:     Rate and Rhythm: Normal rate and regular rhythm.     Heart sounds: Normal heart sounds. No gallop.   Pulmonary:     Effort: Pulmonary effort is normal. No respiratory distress.     Breath sounds: Normal breath sounds. No wheezing or rales.  Abdominal:     General: Bowel sounds are normal. There is no distension or abdominal bruit.     Palpations: Abdomen is soft. There is no mass.     Tenderness: There is no abdominal tenderness.  Musculoskeletal:     Cervical back: Normal range  of motion and neck supple.     Right lower leg: No edema.     Left lower leg: No edema.  Lymphadenopathy:     Cervical: No cervical adenopathy.  Skin:    General: Skin is warm and dry.     Findings: No rash.  Neurological:     Mental Status: He is alert. Mental status is at baseline.     Sensory: No sensory deficit.     Coordination: Coordination normal.     Deep Tendon Reflexes: Reflexes are normal and symmetric. Reflexes normal.  Psychiatric:        Mood and Affect: Mood normal.     Comments: Pleasant  Baseline intellectual impairment            Assessment & Plan:   Problem List Items  Addressed This Visit      Cardiovascular and Mediastinum   HYPERTENSION, BENIGN ESSENTIAL - Primary    bp in fair control at this time  BP Readings from Last 1 Encounters:  08/11/20 110/64   No changes needed Most recent labs reviewed  Disc lifstyle change with low sodium diet and exercise  Plan to continue lisinopril 2.5 mg daily      Relevant Medications   atorvastatin (LIPITOR) 10 MG tablet   gemfibrozil (LOPID) 600 MG tablet   lisinopril (ZESTRIL) 5 MG tablet     Endocrine   Controlled type 2 diabetes mellitus without complication, without long-term current use of insulin (HCC)    Lab Results  Component Value Date   HGBA1C 6.4 07/16/2020   Improved Also wt loss noted Commended on better diet and more activity  Sent for last eye exam  Good foot care/podiatry  F/u 6 mo  On ace and statin       Relevant Medications   atorvastatin (LIPITOR) 10 MG tablet   lisinopril (ZESTRIL) 5 MG tablet   metFORMIN (GLUCOPHAGE-XR) 500 MG 24 hr tablet   Mixed hyperlipidemia due to type 2 diabetes mellitus ([place])    Disc goals for lipids and reasons to control them Rev last labs with pt Rev low sat fat diet in detail LDL down to 56 Plan to continue atorvastatin 10 mg daily and gemfibrozil 600 mg bid       Relevant Medications   atorvastatin (LIPITOR) 10 MG tablet   gemfibrozil  (LOPID) 600 MG tablet   lisinopril (ZESTRIL) 5 MG tablet   metFORMIN (GLUCOPHAGE-XR) 500 MG 24 hr tablet     Other   Obesity    Discussed how this problem influences overall health and the risks it imposes  Reviewed plan for weight loss with lower calorie diet (via better food choices and also portion control or program like weight watchers) and exercise building up to or more than 30 minutes 5 days per week including some aerobic activity  Commended on wt loss and good work so far        Relevant Medications   metFORMIN (GLUCOPHAGE-XR) 500 MG 24 hr tablet

## 2020-08-11 NOTE — Assessment & Plan Note (Signed)
Discussed how this problem influences overall health and the risks it imposes  Reviewed plan for weight loss with lower calorie diet (via better food choices and also portion control or program like weight watchers) and exercise building up to or more than 30 minutes 5 days per week including some aerobic activity  Commended on wt loss and good work so far

## 2020-08-11 NOTE — Patient Instructions (Signed)
Weight is great !   Labs look good also  Keep working on [employer]  Drink your water  Try to stay active and keep moving   Stop at check out and we will send for your eye exam

## 2020-08-11 NOTE — Assessment & Plan Note (Signed)
Disc goals for lipids and reasons to control them Rev last labs with pt Rev low sat fat diet in detail LDL down to 56 Plan to continue atorvastatin 10 mg daily and gemfibrozil 600 mg bid

## 2020-10-15 ENCOUNTER — Ambulatory Visit (INDEPENDENT_AMBULATORY_CARE_PROVIDER_SITE_OTHER): Payer: Medicare Other

## 2020-10-15 DIAGNOSIS — Z0000 Encounter for general adult medical examination without abnormal findings: Secondary | ICD-10-CM | POA: Diagnosis not present

## 2020-10-15 NOTE — Progress Notes (Signed)
Subjective:   Daniel Bailey. is a 70 y.o. male who presents for Medicare Annual/Subsequent preventive examination.  Review of Systems: N/A      I connected with the patient today by telephone and verified that I am speaking with the correct person using two identifiers. Location patient: home Location nurse: work Persons participating in the telephone visit: patient, Daniel Bailey (HIPAA verified) and nurse.   I discussed the limitations, risks, security and privacy concerns of performing an evaluation and management service by telephone and the availability of in person appointments. I also discussed with the patient that there may be a patient responsible charge related to this service. The patient expressed understanding and verbally consented to this telephonic visit.        Cardiac Risk Factors include: advanced age (>20men, >6 women);male gender;diabetes mellitus;hypertension;Other (see comment), Risk factor comments: hyperlipidemia     Objective:    Today's Vitals   There is no height or weight on file to calculate BMI.  Advanced Directives 10/15/2020 10/15/2019 09/26/2018 11/20/2017 11/13/2017 08/29/2017 08/25/2016  Does Patient Have a Medical Advance Directive? No No Yes Yes No Yes No  Type of Advance Directive - Public librarian;Living will [place] (No Data) [place];Living will -  Does patient want to make changes to medical advance directive? - - No - Guardian declined No - Patient declined No - Patient declined - -  Copy of [place] in Chart? - - Yes - validated most recent copy scanned in chart (See row information) No - copy requested - No - copy requested -  Would patient like information on creating a medical advance directive? No - Patient declined No - Patient declined - - No - Patient declined - -    Current Medications (verified) Outpatient Encounter Medications as of 10/15/2020  Medication Sig    acetaminophen (TYLENOL) 500 MG tablet Take 1,000 mg by mouth daily as needed for headache.   atorvastatin (LIPITOR) 10 MG tablet Take 1 tablet (10 mg total) by mouth daily. In the evening   gemfibrozil (LOPID) 600 MG tablet Take 1 tablet (600 mg total) by mouth 2 (two) times daily.   lisinopril (ZESTRIL) 5 MG tablet Take 0.5 tablets (2.5 mg total) by mouth daily.   metFORMIN (GLUCOPHAGE-XR) 500 MG 24 hr tablet Take 2 tablets (1,000 mg total) by mouth daily with breakfast.   No facility-administered encounter medications on file as of 10/15/2020.    Allergies (verified) Patient has no known allergies.   History: Past Medical History:  Diagnosis Date   Glaucoma    Hyperglycemia    Hyperlipidemia    Hypertension    Past Surgical History:  Procedure Laterality Date   COLONOSCOPY     PROSTATE BIOPSY N/A 11/20/2017   Procedure: BIOPSY TRANSRECTAL ULTRASONIC PROSTATE (TUBP);  Surgeon: Abbie Sons, MD;  Location: [facility] ORS;  Service: Urology;  Laterality: N/A;   Family History  Problem Relation Age of Onset   Colon cancer Mother    Social History   Socioeconomic History   Marital status: Married    Spouse name: Not on file   Number of children: Not on file   Years of education: Not on file   Highest education level: Not on file  Occupational History   Not on file  Tobacco Use   Smoking status: Never   Smokeless tobacco: Never  Vaping Use   Vaping Use: Never used  Substance and Sexual  Activity   Alcohol use: No    Alcohol/week: 0.0 standard drinks   Drug use: No   Sexual activity: Not Currently  Other Topics Concern   Not on file  Social History Narrative   Not on file   Social Determinants of Health   Financial Resource Strain: Low Risk    Difficulty of Paying Living Expenses: Not hard at all  Food Insecurity: No Food Insecurity   Worried About Charity fundraiser in the Last Year: Never true   [place] in the Last Year: Never true  Transportation  Needs: No Transportation Needs   Lack of Transportation (Medical): No   Lack of Transportation (Non-Medical): No  Physical Activity: Inactive   Days of Exercise per Week: 0 days   Minutes of Exercise per Session: 0 min  Stress: No Stress Concern Present   Feeling of Stress : Not at all  Social Connections: Not on file    Tobacco Counseling Counseling given: Not Answered   Clinical Intake:  Pre-visit preparation completed: Yes  Pain : No/denies pain     Nutritional Risks: None Diabetes: Yes CBG done?: No Did pt. bring in CBG monitor from home?: No  How often do you need to have someone help you when you read instructions, pamphlets, or other written materials from your doctor or pharmacy?: 1 - Never  Diabetic: Yes Nutrition Risk Assessment:  Has the patient had any N/V/D within the last 2 months?  No  Does the patient have any non-healing wounds?  No  Has the patient had any unintentional weight loss or weight gain?  No   Diabetes:  Is the patient diabetic?  Yes  If diabetic, was a CBG obtained today?  No  telephone visit  Did the patient bring in their glucometer from home?  No  telephone visit  How often do you monitor your CBG's? never.   Financial Strains and Diabetes Management:  Are you having any financial strains with the device, your supplies or your medication? No .  Does the patient want to be seen by Chronic Care Management for management of their diabetes?  No  Would the patient like to be referred to a Nutritionist or for Diabetic Management?  No   Diabetic Exams:  Diabetic Eye Exam: Overdue for diabetic eye exam. Pt has been advised about the importance in completing this exam. Patient advised to call and schedule an eye exam. Diabetic Foot Exam: Completed 08/11/2020   Interpreter Needed?: No  Information entered by :: [place], LPN   Activities of Daily Living In your present state of health, do you have any difficulty performing the  following activities: 10/15/2020  Hearing? N  Vision? N  Difficulty concentrating or making decisions? Y  Comment dementia  Walking or climbing stairs? N  Dressing or bathing? N  Doing errands, shopping? N  Preparing Food and eating ? N  Using the Toilet? N  In the past six months, have you accidently leaked urine? N  Do you have problems with loss of bowel control? N  Managing your Medications? N  Managing your Finances? N  Housekeeping or managing your Housekeeping? N  Some recent data might be hidden    Patient Care Team: Tower, Wynelle Fanny, MD as PCP - General  Indicate any recent Medical Services you may have received from other than [facility] providers in the past year (date may be approximate).     Assessment:   This is a routine wellness examination  for [employer].  Hearing/Vision screen Vision Screening - Comments:: Patient gets annual eye exams   Dietary issues and exercise activities discussed: Current Exercise Habits: The patient does not participate in regular exercise at present, Exercise limited by: None identified   Goals Addressed             This Visit's Progress    Patient Stated       10/15/2020, I will maintain and continue medications as prescribed.         Depression Screen PHQ 2/9 Scores 10/15/2020 10/15/2019 09/26/2018 08/29/2017 08/25/2016 08/25/2015 08/21/2014  PHQ - 2 Score 0 0 0 0 0 0 0  PHQ- 9 Score 0 0 0 0 - - -    Fall Risk Fall Risk  10/15/2020 10/15/2019 09/26/2018 08/29/2017 08/25/2016  Falls in the past year? 0 0 0 No No  Number falls in past yr: 0 0 - - -  Injury with Fall? 0 0 - - -  Risk for fall due to : Medication side effect Medication side effect - - -  Follow up Falls evaluation completed;Falls prevention discussed Falls evaluation completed;Falls prevention discussed - - -    FALL RISK PREVENTION PERTAINING TO THE HOME:  Any stairs in or around the home? Yes  If so, are there any without handrails? No  Home free of loose throw rugs in  walkways, pet beds, electrical cords, etc? Yes  Adequate lighting in your home to reduce risk of falls? Yes   ASSISTIVE DEVICES UTILIZED TO PREVENT FALLS:  Life alert? No  Use of a cane, walker or w/c? No  Grab bars in the bathroom? No  Shower chair or bench in shower? No  Elevated toilet seat or a handicapped toilet? No   TIMED UP AND GO:  Was the test performed?  N/A telephone visit .   Cognitive Function: MMSE - Mini Mental State Exam 10/15/2020 10/15/2019 09/26/2018 08/29/2017 08/25/2016  Not completed: Unable to complete Unable to complete Unable to complete Unable to complete Unable to complete  Mini Cog  Mini-Cog screen was not completed. Unable to complete. Maximum score is 22. A value of 0 denotes this part of the MMSE was not completed or the patient failed this part of the Mini-Cog screening.       Immunizations Immunization History  Administered Date(s) Administered   Fluad Quad(high Dose 65+) 01/07/2019, 01/28/2020   Influenza Split 02/19/2012   Influenza,inj,Quad PF,6+ Mos 02/28/2013, 02/18/2014, 02/22/2015, 02/25/2016, 03/02/2017, 03/13/2018   PFIZER(Purple Top)SARS-COV-2 Vaccination 07/13/2019, 08/13/2019, 05/04/2020   Pneumococcal Conjugate-13 08/25/2015   Pneumococcal Polysaccharide-23 08/25/2016   Td 02/03/1997, 12/10/2006, 11/23/2009, 01/28/2020    TDAP status: Up to date  Flu Vaccine status: Up to date  Pneumococcal vaccine status: Up to date  Covid-19 vaccine status: Completed 3 vaccines, Second booster due. Patient aware  Qualifies for Shingles Vaccine? Yes   Zostavax completed No   Shingrix Completed?: No.    Education has been provided regarding the importance of this vaccine. Patient has been advised to call insurance company to determine out of pocket expense if they have not yet received this vaccine. Advised may also receive vaccine at local pharmacy or Health Dept. Verbalized acceptance and understanding.  Screening Tests Health Maintenance   Topic Date Due   Zoster Vaccines- Shingrix (1 of 2) Never done   OPHTHALMOLOGY EXAM  02/25/2020   COVID-19 Vaccine (4 - Booster for Pfizer series) 09/02/2020   INFLUENZA VACCINE  12/06/2020   HEMOGLOBIN A1C  01/16/2021  FOOT EXAM  08/11/2021   COLONOSCOPY (Pts 45-59yrs Insurance coverage will need to be confirmed)  04/10/2022   TETANUS/TDAP  01/27/2030   Hepatitis C Screening  Completed   PNA vac Low Risk Adult  Completed   HPV VACCINES  Aged Out    Health Maintenance  Health Maintenance Due  Topic Date Due   Zoster Vaccines- Shingrix (1 of 2) Never done   OPHTHALMOLOGY EXAM  02/25/2020   COVID-19 Vaccine (4 - Booster for Pfizer series) 09/02/2020    Colorectal cancer screening: Type of screening: Colonoscopy. Completed 04/10/2012. Repeat every 10 years  Lung Cancer Screening: (Low Dose CT Chest recommended if Age 30-80 years, 30 pack-year currently smoking OR have quit w/in 15years.) does not qualify.    Additional Screening:  Hepatitis C Screening: does qualify; Completed 08/25/2016  Vision Screening: Recommended annual ophthalmology exams for early detection of glaucoma and other disorders of the eye. Is the patient up to date with their annual eye exam?  Yes  Who is the provider or what is the name of the office in which the patient attends annual eye exams? [facility] If pt is not established with a provider, would they like to be referred to a provider to establish care? No .   Dental Screening: Recommended annual dental exams for proper oral hygiene  Community Resource Referral / Chronic Care Management: CRR required this visit?  No   CCM required this visit?  No      Plan:     I have personally reviewed and noted the following in the patient's chart:   Medical and social history Use of alcohol, tobacco or illicit drugs  Current medications and supplements including opioid prescriptions. Patient is not currently taking opioid prescriptions. Functional  ability and status Nutritional status Physical activity Advanced directives List of other physicians Hospitalizations, surgeries, and ER visits in previous 12 months Vitals Screenings to include cognitive, depression, and falls Referrals and appointments  In addition, I have reviewed and discussed with patient certain preventive protocols, quality metrics, and best practice recommendations. A written personalized care plan for preventive services as well as general preventive health recommendations were provided to patient.   Due to this being a telephonic visit, the after visit summary with patients personalized plan was offered to patient via office or my-chart. Patient preferred to pick up at office at next visit or via mychart.   Andrez Grime, LPN   [id number]

## 2020-10-15 NOTE — Patient Instructions (Signed)
Daniel Bailey , Thank you for taking time to come for your Medicare Wellness Visit. I appreciate your ongoing commitment to your health goals. Please review the following plan we discussed and let me know if I can assist you in the future.   Screening recommendations/referrals: Colonoscopy: Up to date, completed 04/10/2012, due 04/2022 Recommended yearly ophthalmology/optometry visit for glaucoma screening and checkup Recommended yearly dental visit for hygiene and checkup  Vaccinations: Influenza vaccine: Up to date, completed 01/28/2020, due 01/2030 Pneumococcal vaccine: Completed series Tdap vaccine: Up to date, completed 01/28/2020, due 01/2030 Shingles vaccine: due, check with your insurance regarding coverage if interested    Covid-19: completed 3 vaccines, second booster due   Advanced directives: Advance directive discussed with you today. Even though you declined this today please call our office should you change your mind and we can give you the proper paperwork for you to fill out.  Conditions/risks identified: diabetes, hypertension, hyperlipidemia   Next appointment: Follow up in one year for your annual wellness visit.   Preventive Care 71 Years and Older, Male Preventive care refers to lifestyle choices and visits with your health care provider that can promote health and wellness. What does preventive care include? A yearly physical exam. This is also called an annual well check. Dental exams once or twice a year. Routine eye exams. Ask your health care provider how often you should have your eyes checked. Personal lifestyle choices, including: Daily care of your teeth and gums. Regular physical activity. Eating a healthy diet. Avoiding tobacco and drug use. Limiting alcohol use. Practicing safe sex. Taking low doses of aspirin every day. Taking vitamin and mineral supplements as recommended by your health care provider. What happens during an annual well check? The  services and screenings done by your health care provider during your annual well check will depend on your age, overall health, lifestyle risk factors, and family history of disease. Counseling  Your health care provider may ask you questions about your: Alcohol use. Tobacco use. Drug use. Emotional well-being. Home and relationship well-being. Sexual activity. Eating habits. History of falls. Memory and ability to understand (cognition). Work and work Statistician. Screening  You may have the following tests or measurements: Height, weight, and BMI. Blood pressure. Lipid and cholesterol levels. These may be checked every 5 years, or more frequently if you are over 22 years old. Skin check. Lung cancer screening. You may have this screening every year starting at age 74 if you have a 30-pack-year history of smoking and currently smoke or have quit within the past 15 years. Fecal occult blood test (FOBT) of the stool. You may have this test every year starting at age 108. Flexible sigmoidoscopy or colonoscopy. You may have a sigmoidoscopy every 5 years or a colonoscopy every 10 years starting at age 43. Prostate cancer screening. Recommendations will vary depending on your family history and other risks. Hepatitis C blood test. Hepatitis B blood test. Sexually transmitted disease (STD) testing. Diabetes screening. This is done by checking your blood sugar (glucose) after you have not eaten for a while (fasting). You may have this done every 1-3 years. Abdominal aortic aneurysm (AAA) screening. You may need this if you are a current or former smoker. Osteoporosis. You may be screened starting at age 36 if you are at high risk. Talk with your health care provider about your test results, treatment options, and if necessary, the need for more tests. Vaccines  Your health care provider may recommend certain vaccines,  such as: Influenza vaccine. This is recommended every year. Tetanus,  diphtheria, and acellular pertussis (Tdap, Td) vaccine. You may need a Td booster every 10 years. Zoster vaccine. You may need this after age 71. Pneumococcal 13-valent conjugate (PCV13) vaccine. One dose is recommended after age 55. Pneumococcal polysaccharide (PPSV23) vaccine. One dose is recommended after age 66. Talk to your health care provider about which screenings and vaccines you need and how often you need them. This information is not intended to replace advice given to you by your health care provider. Make sure you discuss any questions you have with your health care provider. Document Released: 05/21/2015 Document Revised: 01/12/2016 Document Reviewed: 02/23/2015 Elsevier Interactive Patient Education  2017 [place] Prevention in the Home Falls can cause injuries. They can happen to people of all ages. There are many things you can do to make your home safe and to help prevent falls. What can I do on the outside of my home? Regularly fix the edges of walkways and driveways and fix any cracks. Remove anything that might make you trip as you walk through a door, such as a raised step or threshold. Trim any bushes or trees on the path to your home. Use bright outdoor lighting. Clear any walking paths of anything that might make someone trip, such as rocks or tools. Regularly check to see if handrails are loose or broken. Make sure that both sides of any steps have handrails. Any raised decks and porches should have guardrails on the edges. Have any leaves, snow, or ice cleared regularly. Use sand or salt on walking paths during winter. Clean up any spills in your garage right away. This includes oil or grease spills. What can I do in the bathroom? Use night lights. Install grab bars by the toilet and in the tub and shower. Do not use towel bars as grab bars. Use non-skid mats or decals in the tub or shower. If you need to sit down in the shower, use a plastic,  non-slip stool. Keep the floor dry. Clean up any water that spills on the floor as soon as it happens. Remove soap buildup in the tub or shower regularly. Attach bath mats securely with double-sided non-slip rug tape. Do not have throw rugs and other things on the floor that can make you trip. What can I do in the bedroom? Use night lights. Make sure that you have a light by your bed that is easy to reach. Do not use any sheets or blankets that are too big for your bed. They should not hang down onto the floor. Have a firm chair that has side arms. You can use this for support while you get dressed. Do not have throw rugs and other things on the floor that can make you trip. What can I do in the kitchen? Clean up any spills right away. Avoid walking on wet floors. Keep items that you use a lot in easy-to-reach places. If you need to reach something above you, use a strong step stool that has a grab bar. Keep electrical cords out of the way. Do not use floor polish or wax that makes floors slippery. If you must use wax, use non-skid floor wax. Do not have throw rugs and other things on the floor that can make you trip. What can I do with my stairs? Do not leave any items on the stairs. Make sure that there are handrails on both sides of the stairs and  use them. Fix handrails that are broken or loose. Make sure that handrails are as long as the stairways. Check any carpeting to make sure that it is firmly attached to the stairs. Fix any carpet that is loose or worn. Avoid having throw rugs at the top or bottom of the stairs. If you do have throw rugs, attach them to the floor with carpet tape. Make sure that you have a light switch at the top of the stairs and the bottom of the stairs. If you do not have them, ask someone to add them for you. What else can I do to help prevent falls? Wear shoes that: Do not have high heels. Have rubber bottoms. Are comfortable and fit you well. Are closed  at the toe. Do not wear sandals. If you use a stepladder: Make sure that it is fully opened. Do not climb a closed stepladder. Make sure that both sides of the stepladder are locked into place. Ask someone to hold it for you, if possible. Clearly mark and make sure that you can see: Any grab bars or handrails. First and last steps. Where the edge of each step is. Use tools that help you move around (mobility aids) if they are needed. These include: Canes. Walkers. Scooters. Crutches. Turn on the lights when you go into a dark area. Replace any light bulbs as soon as they burn out. Set up your furniture so you have a clear path. Avoid moving your furniture around. If any of your floors are uneven, fix them. If there are any pets around you, be aware of where they are. Review your medicines with your doctor. Some medicines can make you feel dizzy. This can increase your chance of falling. Ask your doctor what other things that you can do to help prevent falls. This information is not intended to replace advice given to you by your health care provider. Make sure you discuss any questions you have with your health care provider. Document Released: 02/18/2009 Document Revised: 09/30/2015 Document Reviewed: 05/29/2014 Elsevier Interactive Patient Education  2017 [employer].

## 2020-10-15 NOTE — Progress Notes (Signed)
PCP notes:  Health Maintenance: Shingrix- due Eye exam- due  Abnormal Screenings: none   Patient concerns: none   Nurse concerns: none   Next PCP appt.: none

## 2020-11-19 ENCOUNTER — Telehealth: Payer: Self-pay

## 2020-11-19 DIAGNOSIS — Z20822 Contact with and (suspected) exposure to covid-19: Secondary | ICD-10-CM | POA: Diagnosis not present

## 2020-11-19 DIAGNOSIS — Z03818 Encounter for observation for suspected exposure to other biological agents ruled out: Secondary | ICD-10-CM | POA: Diagnosis not present

## 2020-11-19 NOTE — Telephone Encounter (Signed)
Unfortunately no openings in office ( I cannot add  extra as usual today given  I am going out of town tongiht) could ask other provider if they will add on.  Otherwise given he is candidate for antiviral.. would have him be seen at Urgent Care or make virtual with PCP on Monday.

## 2020-11-19 NOTE — Telephone Encounter (Signed)
Daniel Bailey, patient's cousin, on [place], called stating patient was exposed to someone with COVID on 11/14/20, started to have a cough off and on the past 2 days. Went to [facility] today and was tested positive for COVID. Please advised [name] on how to proceed. Thank you  No appointments available today.

## 2020-11-19 NOTE — Telephone Encounter (Signed)
[name] notified as instructed by telephone.  MyChart appointments scheduled with Dr. Lorelei Pont on 11/22/2020 at 11:40 am.  ER precautions given.

## 2020-11-22 ENCOUNTER — Encounter: Payer: Self-pay | Admitting: Family Medicine

## 2020-11-22 ENCOUNTER — Telehealth (INDEPENDENT_AMBULATORY_CARE_PROVIDER_SITE_OTHER): Payer: Medicare Other | Admitting: Family Medicine

## 2020-11-22 VITALS — Ht 66.5 in | Wt 205.0 lb

## 2020-11-22 DIAGNOSIS — U071 COVID-19: Secondary | ICD-10-CM | POA: Diagnosis not present

## 2020-11-22 MED ORDER — PAXLOVID 20 X 150 MG & 10 X 100MG PO TBPK: 3.0000 | ORAL_TABLET | Freq: Two times a day (BID) | ORAL | 0 refills | Status: DC

## 2020-11-22 NOTE — Progress Notes (Signed)
      Eloyse Causey T. Dylann Layne, MD Primary Care and [place] at [facility] [place], [address] Phone: [phone]  FAX: [phone]  Kayne Yuhas. - 70 y.o. male  MRN [id number]  Date of Birth: 1951/01/18  Visit Date: 11/22/2020  PCP: [name], MD  Referred by: Tower, Wynelle Fanny, MD  Virtual Visit via Video Note:  I connected with  [name]. on 11/22/2020 11:40 AM EDT by a video enabled telemedicine application and verified that I am speaking with the correct person using two identifiers.   Location patient: home computer, tablet, or smartphone Location provider: work or home office Consent: Verbal consent directly obtained from [name].. Persons participating in the virtual visit: patient, provider  I discussed the limitations of evaluation and management by telemedicine and the availability of in person appointments. The patient expressed understanding and agreed to proceed.  Chief Complaint  Patient presents with   Covid Positive   Cough   Nasal Congestion    History of Present Illness:  Acute COVID-19: Confirmed Friday.  Confirmed COVID exposure last Sunday. Onset of symptoms last Wednesday.  Has been coughing and nose is running  No fever, chills, shortness of breath, diarrhea, throwing up. No sore throat No rashes.  No smoker No gi symptoms  He overall is feeling tired and does not feel that well.  Additional history is provided by another family member.  Review of Systems as above: See pertinent positives and pertinent negatives per HPI No acute distress verbally   Observations/Objective/Exam:  An attempt was made to discern vital signs over the phone and per patient if applicable and possible.   General:    Alert, Oriented, appears well and in no acute distress  Pulmonary:     On inspection no signs of respiratory distress.  Psych / Neurological:     Pleasant and  cooperative.  Assessment and Plan:    ICD-10-CM   1. COVID-19  U07.1      5-day history of COVID symptoms, confirmed diagnosis, initiate Paxlovid.  Continue with conservative care, recommended DayQuil and NyQuil.  Hold Lipitor and gemfibrozil for total of 10 days after today.  I discussed the assessment and treatment plan with the patient. The patient was provided an opportunity to ask questions and all were answered. The patient agreed with the plan and demonstrated an understanding of the instructions.   The patient was advised to call back or seek an in-person evaluation if the symptoms worsen or if the condition fails to improve as anticipated.  Follow-up: prn unless noted otherwise below No follow-ups on file.  Meds ordered this encounter  Medications   Nirmatrelvir & Ritonavir (PAXLOVID) 20 x 150 MG & 10 x 100MG  TBPK    Sig: Take 3 tablets by mouth 2 (two) times daily. Take nirmatrelvir (150mg ) 2 tablet twice daily for 5 days and ritonavir (100mg ) 1 tablet twice daily for 5 days    Dispense:  30 tablet    Refill:  0   No orders of the defined types were placed in this encounter.   Signed,  Maud Deed. Dainel Arcidiacono, MD

## 2021-02-09 ENCOUNTER — Ambulatory Visit (INDEPENDENT_AMBULATORY_CARE_PROVIDER_SITE_OTHER): Payer: Medicare Other | Admitting: Family Medicine

## 2021-02-09 ENCOUNTER — Other Ambulatory Visit: Payer: Self-pay

## 2021-02-09 ENCOUNTER — Encounter: Payer: Self-pay | Admitting: Family Medicine

## 2021-02-09 VITALS — BP 118/80 | HR 72 | Temp 97.0°F | Ht 66.5 in | Wt 201.2 lb

## 2021-02-09 DIAGNOSIS — Z23 Encounter for immunization: Secondary | ICD-10-CM

## 2021-02-09 DIAGNOSIS — R270 Ataxia, unspecified: Secondary | ICD-10-CM | POA: Insufficient documentation

## 2021-02-09 DIAGNOSIS — E119 Type 2 diabetes mellitus without complications: Secondary | ICD-10-CM | POA: Diagnosis not present

## 2021-02-09 DIAGNOSIS — J069 Acute upper respiratory infection, unspecified: Secondary | ICD-10-CM | POA: Diagnosis not present

## 2021-02-09 DIAGNOSIS — R531 Weakness: Secondary | ICD-10-CM

## 2021-02-09 DIAGNOSIS — R251 Tremor, unspecified: Secondary | ICD-10-CM

## 2021-02-09 LAB — COMPREHENSIVE METABOLIC PANEL
ALT: 17 U/L (ref 0–53)
AST: 29 U/L (ref 0–37)
Albumin: 4.1 g/dL (ref 3.5–5.2)
Alkaline Phosphatase: 88 U/L (ref 39–117)
BUN: 18 mg/dL (ref 6–23)
CO2: 27 mEq/L (ref 19–32)
Calcium: 9.8 mg/dL (ref 8.4–10.5)
Chloride: 98 mEq/L (ref 96–112)
Creatinine, Ser: 1.11 mg/dL (ref 0.40–1.50)
GFR: 67.01 mL/min (ref >60.00)
Glucose, Bld: 79 mg/dL (ref 70–99)
Potassium: 4.1 mEq/L (ref 3.5–5.1)
Sodium: 136 mEq/L (ref 135–145)
Total Bilirubin: 0.3 mg/dL (ref 0.2–1.2)
Total Protein: 7.1 g/dL (ref 6.0–8.3)

## 2021-02-09 LAB — CBC
HCT: 35.6 % — ABNORMAL LOW (ref 39.0–52.0)
Hemoglobin: 12 g/dL — ABNORMAL LOW (ref 13.0–17.0)
MCHC: 33.5 g/dL (ref 30.0–36.0)
MCV: 89.5 fl (ref 78.0–100.0)
Platelets: 241 10*3/uL (ref 150.0–400.0)
RBC: 3.98 Mil/uL — ABNORMAL LOW (ref 4.22–5.81)
RDW: 15.4 % (ref 11.5–15.5)
WBC: 4 10*3/uL (ref 4.0–10.5)

## 2021-02-09 LAB — VITAMIN B12: Vitamin B-12: 410 pg/mL (ref 211–911)

## 2021-02-09 LAB — TSH: TSH: 3.41 u[IU]/mL (ref 0.35–5.50)

## 2021-02-09 LAB — HEMOGLOBIN A1C: Hgb A1c MFr Bld: 6.4 % (ref 4.6–6.5)

## 2021-02-09 NOTE — Patient Instructions (Addendum)
Cough - likely viral - Try over the counter cough medication - if not improving in next 2-4 weeks, return  Balance issues - labs today - refer to physical therapy  #Referral I have placed a referral to a specialist for you. You should receive a phone call from the specialty office. Make sure your voicemail is not full and that if you are able to answer your phone to unknown or new numbers.   It may take up to 2 weeks to hear about the referral. If you do not hear anything in 2 weeks, please call our office and ask to speak with the referral coordinator.

## 2021-02-09 NOTE — Assessment & Plan Note (Signed)
Lab Results  Component Value Date   HGBA1C 6.4 07/16/2020  Well controlled but given some recent shaking will check to see if changes in glucose level given limited history 2/2 to delay. Cont metformin 1000 mg and gemfibrozil 600 mg bid

## 2021-02-09 NOTE — Assessment & Plan Note (Signed)
Some difficulty with cerebellar testing but wonder if intellectual disability is the barrier to understanding instructions. PT referral and labs today

## 2021-02-09 NOTE — Progress Notes (Signed)
Subjective:     Daniel Bailey. is a 70 y.o. male presenting for Tremors     HPI  #tremors - new symptoms - when sitting in chair will be shaking - notes feeling cold at time - shaking is when feeling cold  - every once in a while - has been unsteady on feet - denies weakness - he does not have steps at the house - but does not typically have trouble with steps - no lightheadedness or dizziness  #not feeling well - x 2 weeks - speech is "different"  - no sore throat - denies congestion or drainage - has been blowing his nose - has been coughing a lot  - symptoms x 1-2 weeks - no fevers - appetite is good - was poor last week - no stomach pain - no diarrhea or nausea - can still taste food - no headaches - no sinus pressure - cough - caregiver mentions that he has not been as conversant as normal   Review of Systems   Social History   Tobacco Use  Smoking Status Never  Smokeless Tobacco Never        Objective:    BP Readings from Last 3 Encounters:  02/09/21 118/80  08/11/20 110/64  01/28/20 122/74   Wt Readings from Last 3 Encounters:  02/09/21 201 lb 4 oz (91.3 kg)  11/22/20 205 lb (93 kg)  08/11/20 203 lb 8 oz (92.3 kg)    BP 118/80   Pulse 72   Temp (!) 97 F (36.1 C) (Temporal)   Ht 5' 6.5" (1.689 m)   Wt 201 lb 4 oz (91.3 kg)   SpO2 99%   BMI 32.00 kg/m    Physical Exam Constitutional:      General: He is not in acute distress.    Appearance: He is well-developed. He is not ill-appearing.  HENT:     Head: Normocephalic and atraumatic.     Right Ear: Tympanic membrane and ear canal normal.     Left Ear: Tympanic membrane and ear canal normal.     Nose: Mucosal edema and rhinorrhea present.     Right Sinus: No maxillary sinus tenderness or frontal sinus tenderness.     Left Sinus: No maxillary sinus tenderness or frontal sinus tenderness.     Mouth/Throat:     Pharynx: Uvula midline. Posterior oropharyngeal erythema present.  No oropharyngeal exudate.     Tonsils: 0 on the right. 0 on the left.  Cardiovascular:     Rate and Rhythm: Normal rate and regular rhythm.     Heart sounds: No murmur heard. Pulmonary:     Effort: Pulmonary effort is normal. No respiratory distress.     Breath sounds: Normal breath sounds.  Musculoskeletal:     Cervical back: Neck supple.  Lymphadenopathy:     Cervical: No cervical adenopathy.  Skin:    General: Skin is warm and dry.     Capillary Refill: Capillary refill takes less than 2 seconds.  Neurological:     General: No focal deficit present.     Mental Status: He is alert.     Cranial Nerves: No cranial nerve deficit.     Motor: No weakness.     Comments: Gait with some intermittent tremor and instability          Assessment & Plan:   Problem List Items Addressed This Visit       Endocrine   Controlled type 2 diabetes mellitus without  complication, without long-term current use of insulin ([place])    Lab Results  Component Value Date   HGBA1C 6.4 07/16/2020  Well controlled but given some recent shaking will check to see if changes in glucose level given limited history 2/2 to delay. Cont metformin 1000 mg and gemfibrozil 600 mg bid       Relevant Orders   Hemoglobin A1c     Other   Tremor   Relevant Orders   Comprehensive metabolic panel   CBC   Vitamin B12   TSH   Ambulatory referral to Physical Therapy   Generalized weakness - Primary    Associated with balance, instability and new shaking with ambulation. Will check labs today. Will also plan for physical therapy referral given lack of acute finding and waxing and waning symptoms.       Relevant Orders   Comprehensive metabolic panel   CBC   Vitamin B12   TSH   Ambulatory referral to Physical Therapy   Ataxia    Some difficulty with cerebellar testing but wonder if intellectual disability is the barrier to understanding instructions. PT referral and labs today       Relevant Orders    Vitamin B12   Other Visit Diagnoses     Viral URI with cough       Need for influenza vaccination       Relevant Orders   Flu Vaccine QUAD High Dose(Fluad) (Completed)      URI - suspect viral cold. Advised otc cough medication. Lungs clear - so no CXR today and no fevers are reassuring.    Return if symptoms worsen or fail to improve.  Lesleigh Noe, MD  This visit occurred during the SARS-CoV-2 public health emergency.  Safety protocols were in place, including screening questions prior to the visit, additional usage of staff PPE, and extensive cleaning of exam room while observing appropriate contact time as indicated for disinfecting solutions.

## 2021-02-09 NOTE — Assessment & Plan Note (Signed)
Associated with balance, instability and new shaking with ambulation. Will check labs today. Will also plan for physical therapy referral given lack of acute finding and waxing and waning symptoms.

## 2021-05-17 ENCOUNTER — Ambulatory Visit (INDEPENDENT_AMBULATORY_CARE_PROVIDER_SITE_OTHER): Payer: Medicare Other | Admitting: Family Medicine

## 2021-05-17 ENCOUNTER — Other Ambulatory Visit: Payer: Self-pay

## 2021-05-17 ENCOUNTER — Encounter: Payer: Self-pay | Admitting: Family Medicine

## 2021-05-17 VITALS — BP 110/88 | HR 55 | Temp 96.1°F | Resp 12 | Ht 66.5 in | Wt 198.1 lb

## 2021-05-17 DIAGNOSIS — H6123 Impacted cerumen, bilateral: Secondary | ICD-10-CM

## 2021-05-17 DIAGNOSIS — I10 Essential (primary) hypertension: Secondary | ICD-10-CM | POA: Diagnosis not present

## 2021-05-17 DIAGNOSIS — E119 Type 2 diabetes mellitus without complications: Secondary | ICD-10-CM

## 2021-05-17 DIAGNOSIS — R350 Frequency of micturition: Secondary | ICD-10-CM | POA: Diagnosis not present

## 2021-05-17 DIAGNOSIS — Z125 Encounter for screening for malignant neoplasm of prostate: Secondary | ICD-10-CM | POA: Diagnosis not present

## 2021-05-17 DIAGNOSIS — R4182 Altered mental status, unspecified: Secondary | ICD-10-CM | POA: Diagnosis not present

## 2021-05-17 DIAGNOSIS — H6120 Impacted cerumen, unspecified ear: Secondary | ICD-10-CM | POA: Insufficient documentation

## 2021-05-17 DIAGNOSIS — R972 Elevated prostate specific antigen [PSA]: Secondary | ICD-10-CM

## 2021-05-17 DIAGNOSIS — R251 Tremor, unspecified: Secondary | ICD-10-CM | POA: Diagnosis not present

## 2021-05-17 DIAGNOSIS — R270 Ataxia, unspecified: Secondary | ICD-10-CM | POA: Diagnosis not present

## 2021-05-17 NOTE — Patient Instructions (Signed)
Get debrox or similar product over the counter and use in ears twice week as directed   Let's check urinalysis and labs   I can't rule out early dementia  Aricept is a medication that can slow down progression  I want to consider it   Stay up during the day for better sleep at night Try and keep a routine

## 2021-05-17 NOTE — Assessment & Plan Note (Signed)
More fatigued/quiet and with short term memory difficulty  Some apraxia (no slurring) Reassuring neuro exam today Balance is poor  ua clear and labs pending Some concern for age related cognitive impairment  Cannot assess with MMSE due to his intellectual disability Family is not interested in neuro referral yet  May consider aricept in the future/handout given

## 2021-05-17 NOTE — Assessment & Plan Note (Signed)
Some balance issues/has not done PT  Unsure if related to most recent mental status/behavior change

## 2021-05-17 NOTE — Assessment & Plan Note (Signed)
bp in fair control at this time  BP Readings from Last 1 Encounters:  05/17/21 110/88   No changes needed Most recent labs reviewed  Disc lifstyle change with low sodium diet and exercise  Plan to continue lisinopril 2.5 mg daily

## 2021-05-17 NOTE — Assessment & Plan Note (Signed)
psa added to labs today H/o BPH Urination is more frequent

## 2021-05-17 NOTE — Assessment & Plan Note (Signed)
Bilateral Hearing loss may affect his mentation at this point  Suggested use of debrox twice weekly  Can f/u for irritation if needed in the future

## 2021-05-17 NOTE — Assessment & Plan Note (Signed)
A1C added to labs today Some weight loss  Family is helping pt work on diet  Sometimes forgets to eat

## 2021-05-17 NOTE — Assessment & Plan Note (Signed)
Mild with intention  (worse with fatigue) Not pill rolling  Some in tongue   No bradykinesia

## 2021-05-17 NOTE — Progress Notes (Signed)
Subjective:    Patient ID: Daniel Bailey., male    DOB: [birth year], 71 y.o.   MRN: [id number]  This visit occurred during the SARS-CoV-2 public health emergency.  Safety protocols were in place, including screening questions prior to the visit, additional usage of staff PPE, and extensive cleaning of exam room while observing appropriate contact time as indicated for disinfecting solutions.   HPI Pt presents for f/u of change in mental status   Wt Readings from Last 3 Encounters:  05/17/21 198 lb 2 oz (89.9 kg)  02/09/21 201 lb 4 oz (91.3 kg)  11/22/20 205 lb (93 kg)   31.50 kg/m  Good appetite but forgets to eat   BP Readings from Last 3 Encounters:  05/17/21 110/88  02/09/21 118/80  08/11/20 110/64   Pulse Readings from Last 3 Encounters:  05/17/21 (!) 55  02/09/21 72  08/11/20 67     Had more confusion over the weekend  Hard time forming sentences for 2 weeks (not slurring but a bit of a stutter)  Did fall about a week ago (did not hit his head) -bad balance, and a little more tremor  Stares into space a bit  Less talkative than usual lately  Gets frustrated moreso   More easily confused   Not sundowning  Watches TV at night    Cousin stays with him/aunt cares for him   Not sleeping well  Light will stay on /sits up in the bed  Is fatigued  Does not snore as a rule   Denies urinary pain  May go more often  Not straining to have a bm   In the fall seen by Dr Bevelyn Buckles gait problems and tremor  Did not do PT  Was congested also   Last labs in October Lab Results  Component Value Date   CREATININE 1.11 02/09/2021   BUN 18 02/09/2021   NA 136 02/09/2021   K 4.1 02/09/2021   CL 98 02/09/2021   CO2 27 02/09/2021   Glucose 79 Lab Results  Component Value Date   VITAMINB12 410 02/09/2021   Lab Results  Component Value Date   WBC 4.0 02/09/2021   HGB 12.0 (L) 02/09/2021   HCT 35.6 (L) 02/09/2021   MCV 89.5 02/09/2021   PLT 241.0 02/09/2021    Lab Results  Component Value Date   TSH 3.41 02/09/2021      HTN bp is stable today  No cp or palpitations or headaches or edema  No side effects to medicines  BP Readings from Last 3 Encounters:  05/17/21 110/88  02/09/21 118/80  08/11/20 110/64     Pulse Readings from Last 3 Encounters:  05/17/21 (!) 55  02/09/21 72  08/11/20 67    Lisinopril 2.5 mg daily   DM2 Lab Results  Component Value Date   HGBA1C 6.4 02/09/2021   Stable   Lab Results  Component Value Date   PSA1 4.6 (H) 10/11/2017   PSA 1.21 01/28/2020   PSA 7.62 (H) 08/29/2017   PSA 1.11 08/25/2016   H/o elevated psa   Ua today is entirely clear   Patient Active Problem List   Diagnosis Date Noted   Cerumen impaction 05/17/2021   Change in mental status 05/17/2021   Frequent urination 05/17/2021   Tremor 02/09/2021   Generalized weakness 02/09/2021   Ataxia 02/09/2021   Elevated PSA 09/05/2017   Leukocytopenia 09/01/2016   Mixed hyperlipidemia due to type 2 diabetes mellitus ([place]) 02/19/2014  Encounter for Medicare annual wellness exam 08/27/2012   Obesity 08/18/2011   Colon cancer screening 08/18/2011   Routine general medical examination at a health care facility 08/10/2011   Prostate cancer screening 08/10/2011   Controlled type 2 diabetes mellitus without complication, without long-term current use of insulin ([place]) 05/30/2010   HYPERTENSION, BENIGN ESSENTIAL 12/10/2006   Intellectual disability 11/23/2006   Past Medical History:  Diagnosis Date   Glaucoma    Hyperglycemia    Hyperlipidemia    Hypertension    Past Surgical History:  Procedure Laterality Date   COLONOSCOPY     PROSTATE BIOPSY N/A 11/20/2017   Procedure: BIOPSY TRANSRECTAL ULTRASONIC PROSTATE (TUBP);  Surgeon: Abbie Sons, MD;  Location: [facility] ORS;  Service: Urology;  Laterality: N/A;   Social History   Tobacco Use   Smoking status: Never   Smokeless tobacco: Never  Vaping Use   Vaping Use: Never used   Substance Use Topics   Alcohol use: No    Alcohol/week: 0.0 standard drinks   Drug use: No   Family History  Problem Relation Age of Onset   Colon cancer Mother    No Known Allergies Current Outpatient Medications on File Prior to Visit  Medication Sig Dispense Refill   acetaminophen (TYLENOL) 500 MG tablet Take 1,000 mg by mouth daily as needed for headache.     atorvastatin (LIPITOR) 10 MG tablet Take 1 tablet (10 mg total) by mouth daily. In the evening 90 tablet 3   gemfibrozil (LOPID) 600 MG tablet Take 1 tablet (600 mg total) by mouth 2 (two) times daily. 180 tablet 3   lisinopril (ZESTRIL) 5 MG tablet Take 0.5 tablets (2.5 mg total) by mouth daily. 45 tablet 3   metFORMIN (GLUCOPHAGE-XR) 500 MG 24 hr tablet Take 2 tablets (1,000 mg total) by mouth daily with breakfast. 180 tablet 3   No current facility-administered medications on file prior to visit.     Review of Systems  Constitutional:  Positive for fatigue. Negative for activity change, appetite change, fever and unexpected weight change.  HENT:  Negative for congestion, rhinorrhea, sore throat and trouble swallowing.   Eyes:  Negative for pain, redness, itching and visual disturbance.  Respiratory:  Negative for cough, chest tightness, shortness of breath and wheezing.   Cardiovascular:  Negative for chest pain and palpitations.  Gastrointestinal:  Negative for abdominal pain, blood in stool, constipation, diarrhea and nausea.  Endocrine: Negative for cold intolerance, heat intolerance, polydipsia and polyuria.  Genitourinary:  Negative for difficulty urinating, dysuria, frequency and urgency.  Musculoskeletal:  Positive for arthralgias. Negative for joint swelling and myalgias.  Skin:  Negative for pallor and rash.  Neurological:  Negative for dizziness, tremors, weakness, numbness and headaches.  Hematological:  Negative for adenopathy. Does not bruise/bleed easily.  Psychiatric/Behavioral:  Positive for confusion  and sleep disturbance. Negative for agitation, decreased concentration, dysphoric mood, self-injury and suicidal ideas. The patient is not nervous/anxious.       Objective:   Physical Exam Constitutional:      General: He is not in acute distress.    Appearance: Normal appearance. He is well-developed. He is obese. He is not ill-appearing or diaphoretic.  HENT:     Head: Normocephalic and atraumatic.  Eyes:     Conjunctiva/sclera: Conjunctivae normal.     Pupils: Pupils are equal, round, and reactive to light.  Neck:     Thyroid: No thyromegaly.     Vascular: No carotid bruit or JVD.  Cardiovascular:  Rate and Rhythm: Normal rate and regular rhythm.     Heart sounds: Normal heart sounds.    No gallop.  Pulmonary:     Effort: Pulmonary effort is normal. No respiratory distress.     Breath sounds: Normal breath sounds. No wheezing or rales.  Abdominal:     General: Bowel sounds are normal. There is no distension or abdominal bruit.     Palpations: Abdomen is soft. There is no mass.     Tenderness: There is no abdominal tenderness.  Musculoskeletal:     Cervical back: Normal range of motion and neck supple.     Right lower leg: No edema.     Left lower leg: No edema.  Lymphadenopathy:     Cervical: No cervical adenopathy.  Skin:    General: Skin is warm and dry.     Coloration: Skin is not pale.     Findings: No rash.  Neurological:     Mental Status: He is alert.     Cranial Nerves: No cranial nerve deficit.     Motor: No weakness.     Coordination: Coordination normal.     Deep Tendon Reflexes: Reflexes are normal and symmetric. Reflexes normal.     Comments: Gait is mildly unsteady and wide based   Tongue tremor noted  Hand tremor of intention-bilat and mild   No focal weakness   No bradykinesia  No cogwheel rigidity    Psychiatric:        Mood and Affect: Mood normal.        Speech: Speech is delayed.        Behavior: Behavior normal.     Comments: More  quiet than usual  Some trouble/hesitation getting words out  Not slurring   Nl facial expression           Assessment & Plan:   Problem List Items Addressed This Visit       Cardiovascular and Mediastinum   HYPERTENSION, BENIGN ESSENTIAL    bp in fair control at this time  BP Readings from Last 1 Encounters:  05/17/21 110/88  No changes needed Most recent labs reviewed  Disc lifstyle change with low sodium diet and exercise  Plan to continue lisinopril 2.5 mg daily      Relevant Orders   CBC with Differential/Platelet   Basic metabolic panel     Endocrine   Controlled type 2 diabetes mellitus without complication, without long-term current use of insulin ([facility])    A1C added to labs today Some weight loss  Family is helping pt work on diet  Sometimes forgets to eat      Relevant Orders   Hemoglobin A1c     Nervous and Auditory   Cerumen impaction    Bilateral Hearing loss may affect his mentation at this point  Suggested use of debrox twice weekly  Can f/u for irritation if needed in the future        Other   Prostate cancer screening    psa added to labs today H/o BPH Urination is more frequent      Relevant Orders   PSA, Medicare   Elevated PSA   Tremor    Mild with intention  (worse with fatigue) Not pill rolling  Some in tongue   No bradykinesia       Ataxia    Some balance issues/has not done PT  Unsure if related to most recent mental status/behavior change      Change in  mental status - Primary    More fatigued/quiet and with short term memory difficulty  Some apraxia (no slurring) Reassuring neuro exam today Balance is poor  ua clear and labs pending Some concern for age related cognitive impairment  Cannot assess with MMSE due to his intellectual disability Family is not interested in neuro referral yet  May consider aricept in the future/handout given      Frequent urination    ua is clear May be due to BPH a1c pending   psa ordered (h/o high in the past)      Relevant Orders   POCT Urinalysis Dipstick (Automated)

## 2021-05-17 NOTE — Assessment & Plan Note (Signed)
ua is clear May be due to BPH a1c pending  psa ordered (h/o high in the past)

## 2021-05-18 LAB — CBC WITH DIFFERENTIAL/PLATELET
Basophils Absolute: 0 10*3/uL (ref 0.0–0.1)
Basophils Relative: 0.5 % (ref 0.0–3.0)
Eosinophils Absolute: 0 10*3/uL (ref 0.0–0.7)
Eosinophils Relative: 0.4 % (ref 0.0–5.0)
HCT: 33.4 % — ABNORMAL LOW (ref 39.0–52.0)
Hemoglobin: 11 g/dL — ABNORMAL LOW (ref 13.0–17.0)
Lymphocytes Relative: 21.6 % (ref 12.0–46.0)
Lymphs Abs: 1 10*3/uL (ref 0.7–4.0)
MCHC: 33.1 g/dL (ref 30.0–36.0)
MCV: 91.4 fl (ref 78.0–100.0)
Monocytes Absolute: 0.3 10*3/uL (ref 0.1–1.0)
Monocytes Relative: 6.8 % (ref 3.0–12.0)
Neutro Abs: 3.4 10*3/uL (ref 1.4–7.7)
Neutrophils Relative %: 70.7 % (ref 43.0–77.0)
Platelets: 294 10*3/uL (ref 150.0–400.0)
RBC: 3.65 Mil/uL — ABNORMAL LOW (ref 4.22–5.81)
RDW: 17.2 % — ABNORMAL HIGH (ref 11.5–15.5)
WBC: 4.8 10*3/uL (ref 4.0–10.5)

## 2021-05-18 LAB — POC URINALSYSI DIPSTICK (AUTOMATED)
Bilirubin, UA: NEGATIVE
Blood, UA: NEGATIVE
Glucose, UA: NEGATIVE
Ketones, UA: NEGATIVE
Leukocytes, UA: NEGATIVE
Nitrite, UA: NEGATIVE
Protein, UA: NEGATIVE
Spec Grav, UA: 1.025 (ref 1.010–1.025)
Urobilinogen, UA: 0.2 E.U./dL
pH, UA: 6 (ref 5.0–8.0)

## 2021-05-18 LAB — BASIC METABOLIC PANEL
BUN: 24 mg/dL — ABNORMAL HIGH (ref 6–23)
CO2: 27 mEq/L (ref 19–32)
Calcium: 9.8 mg/dL (ref 8.4–10.5)
Chloride: 94 mEq/L — ABNORMAL LOW (ref 96–112)
Creatinine, Ser: 1.3 mg/dL (ref 0.40–1.50)
GFR: 55.33 mL/min — ABNORMAL LOW (ref >60.00)
Glucose, Bld: 84 mg/dL (ref 70–99)
Potassium: 4.5 mEq/L (ref 3.5–5.1)
Sodium: 129 mEq/L — ABNORMAL LOW (ref 135–145)

## 2021-05-18 LAB — PSA, MEDICARE: PSA: 1.63 ng/ml (ref 0.10–4.00)

## 2021-05-18 LAB — HEMOGLOBIN A1C: Hgb A1c MFr Bld: 6.4 % (ref 4.6–6.5)

## 2021-05-26 ENCOUNTER — Telehealth: Payer: Self-pay | Admitting: Family Medicine

## 2021-05-26 DIAGNOSIS — E871 Hypo-osmolality and hyponatremia: Secondary | ICD-10-CM | POA: Insufficient documentation

## 2021-05-26 DIAGNOSIS — I10 Essential (primary) hypertension: Secondary | ICD-10-CM

## 2021-05-26 DIAGNOSIS — R531 Weakness: Secondary | ICD-10-CM

## 2021-05-26 DIAGNOSIS — D509 Iron deficiency anemia, unspecified: Secondary | ICD-10-CM | POA: Insufficient documentation

## 2021-05-26 DIAGNOSIS — R4182 Altered mental status, unspecified: Secondary | ICD-10-CM

## 2021-05-26 DIAGNOSIS — D649 Anemia, unspecified: Secondary | ICD-10-CM | POA: Insufficient documentation

## 2021-05-26 DIAGNOSIS — R944 Abnormal results of kidney function studies: Secondary | ICD-10-CM | POA: Insufficient documentation

## 2021-05-26 NOTE — Telephone Encounter (Signed)
From mychart from his aunt  Yesterday evening I visited [place] after receiving a call from my son stating Kyros seemed to be a little confused, not able to get any words out and had not eaten much. When I get there I asked Morio if he was hurting he shook his head no. I had him raise his hands up, which he did with no problem; no drooping face; he didn't have a temperature. I will be making an appointment for his labs and x-ray. Not sure what's going on. Should I make another appointment with you?    I noted after last labs:  I need to re check sodium level when they are able to bring him for labs Also want to get more labs for anemia/iron level  Would also like to get a chest xray  Please see if this is possible  Also ask caregiver to cut water intake by about 1/4 for now until we get a result   I do want to check lab and chest xray when/if able  Thanks for letting me know   I would also be interested in a CT of head and/or neuro referral (family was unsure about that prior)  Let me know what [name] thinks

## 2021-05-26 NOTE — Telephone Encounter (Signed)
Spoke with [name] and she did get a call about pt's lab results and she has scheduled a f/u lab/ xray appt at [facility].   [name] does want to proceed with getting a CT of pt's head since his sxs keep coming back. They would like to get it done in [place] if possible.

## 2021-05-26 NOTE — Telephone Encounter (Signed)
I ordered lab and cxr and CT head (no contrast)  For the CT I specified imaging in [place] but if they want to go somewhere else it is fine   Thanks !

## 2021-05-26 NOTE — Telephone Encounter (Signed)
Pt is scheduled for labs and chest-xray in [place] office on 1.24.23 at 3:30pm

## 2021-05-26 NOTE — Telephone Encounter (Signed)
Orders are in, thanks

## 2021-05-31 ENCOUNTER — Other Ambulatory Visit: Payer: Medicare Other

## 2021-05-31 ENCOUNTER — Ambulatory Visit (INDEPENDENT_AMBULATORY_CARE_PROVIDER_SITE_OTHER): Payer: Medicare Other

## 2021-05-31 ENCOUNTER — Other Ambulatory Visit (INDEPENDENT_AMBULATORY_CARE_PROVIDER_SITE_OTHER): Payer: Medicare Other

## 2021-05-31 ENCOUNTER — Other Ambulatory Visit: Payer: Self-pay

## 2021-05-31 DIAGNOSIS — R944 Abnormal results of kidney function studies: Secondary | ICD-10-CM

## 2021-05-31 DIAGNOSIS — E871 Hypo-osmolality and hyponatremia: Secondary | ICD-10-CM

## 2021-05-31 DIAGNOSIS — R531 Weakness: Secondary | ICD-10-CM | POA: Diagnosis not present

## 2021-05-31 DIAGNOSIS — D649 Anemia, unspecified: Secondary | ICD-10-CM | POA: Diagnosis not present

## 2021-05-31 DIAGNOSIS — R5383 Other fatigue: Secondary | ICD-10-CM | POA: Diagnosis not present

## 2021-05-31 DIAGNOSIS — I10 Essential (primary) hypertension: Secondary | ICD-10-CM | POA: Diagnosis not present

## 2021-06-01 LAB — BASIC METABOLIC PANEL
BUN: 23 mg/dL (ref 6–23)
CO2: 27 mEq/L (ref 19–32)
Calcium: 10.4 mg/dL (ref 8.4–10.5)
Chloride: 96 mEq/L (ref 96–112)
Creatinine, Ser: 1.31 mg/dL (ref 0.40–1.50)
GFR: 54.81 mL/min — ABNORMAL LOW (ref >60.00)
Glucose, Bld: 81 mg/dL (ref 70–99)
Potassium: 5 mEq/L (ref 3.5–5.1)
Sodium: 133 mEq/L — ABNORMAL LOW (ref 135–145)

## 2021-06-01 LAB — FERRITIN: Ferritin: 228.5 ng/mL (ref 22.0–322.0)

## 2021-06-01 LAB — IRON: Iron: 102 ug/dL (ref 42–165)

## 2021-06-06 ENCOUNTER — Ambulatory Visit
Admission: RE | Admit: 2021-06-06 | Discharge: 2021-06-06 | Disposition: A | Discharge status: Home / Self Care | Payer: Medicare Other | Source: Ambulatory Visit | Attending: Family Medicine | Admitting: Family Medicine

## 2021-06-06 ENCOUNTER — Other Ambulatory Visit: Payer: Self-pay

## 2021-06-06 DIAGNOSIS — R4182 Altered mental status, unspecified: Secondary | ICD-10-CM | POA: Diagnosis not present

## 2021-06-06 DIAGNOSIS — R531 Weakness: Secondary | ICD-10-CM

## 2021-06-09 NOTE — Telephone Encounter (Signed)
Addressed through result notes  

## 2021-06-09 NOTE — Telephone Encounter (Signed)
[name] returning your call concerning pt.

## 2021-06-16 ENCOUNTER — Telehealth: Payer: Self-pay | Admitting: Family Medicine

## 2021-06-16 DIAGNOSIS — I10 Essential (primary) hypertension: Secondary | ICD-10-CM

## 2021-06-16 DIAGNOSIS — D649 Anemia, unspecified: Secondary | ICD-10-CM

## 2021-06-16 DIAGNOSIS — E871 Hypo-osmolality and hyponatremia: Secondary | ICD-10-CM

## 2021-06-16 NOTE — Telephone Encounter (Signed)
-----   Message from Ellamae Sia sent at 06/10/2021 10:12 AM EST ----- Regarding: Lab orders for Friday, 2.10.23 Lab orders, no f/u

## 2021-06-17 ENCOUNTER — Other Ambulatory Visit: Payer: Self-pay

## 2021-06-17 ENCOUNTER — Other Ambulatory Visit: Payer: Medicare Other

## 2021-06-17 DIAGNOSIS — I10 Essential (primary) hypertension: Secondary | ICD-10-CM | POA: Diagnosis not present

## 2021-06-17 DIAGNOSIS — D649 Anemia, unspecified: Secondary | ICD-10-CM | POA: Diagnosis not present

## 2021-06-17 DIAGNOSIS — E871 Hypo-osmolality and hyponatremia: Secondary | ICD-10-CM | POA: Diagnosis not present

## 2021-06-17 NOTE — Addendum Note (Signed)
Addended by: Isaiah Serge D on: 06/17/2021 04:10 PM   Modules accepted: Orders

## 2021-06-20 LAB — BASIC METABOLIC PANEL
BUN/Creatinine Ratio: 19 (calc) (ref 6–22)
BUN: 25 mg/dL (ref 7–25)
CO2: 25 mmol/L (ref 20–32)
Calcium: 9.5 mg/dL (ref 8.6–10.3)
Chloride: 100 mmol/L (ref 98–110)
Creat: 1.32 mg/dL — ABNORMAL HIGH (ref 0.70–1.28)
Glucose, Bld: 83 mg/dL (ref 65–99)
Potassium: 4.8 mmol/L (ref 3.5–5.3)
Sodium: 134 mmol/L — ABNORMAL LOW (ref 135–146)

## 2021-06-20 LAB — CBC WITH DIFFERENTIAL/PLATELET
Absolute Monocytes: 179 cells/uL — ABNORMAL LOW (ref 200–950)
Basophils Absolute: 0 cells/uL (ref 0–200)
Basophils Relative: 0 %
Eosinophils Absolute: 11 cells/uL — ABNORMAL LOW (ref 15–500)
Eosinophils Relative: 0.4 %
HCT: 28.9 % — ABNORMAL LOW (ref 38.5–50.0)
Hemoglobin: 10.2 g/dL — ABNORMAL LOW (ref 13.2–17.1)
Lymphs Abs: 980 cells/uL (ref 850–3900)
MCH: 30.7 pg (ref 27.0–33.0)
MCHC: 35.3 g/dL (ref 32.0–36.0)
MCV: 87 fL (ref 80.0–100.0)
MPV: 11.1 fL (ref 7.5–12.5)
Monocytes Relative: 6.4 %
Neutro Abs: 1630 cells/uL (ref 1500–7800)
Neutrophils Relative %: 58.2 %
Platelets: 238 10*3/uL (ref 140–400)
RBC: 3.32 10*6/uL — ABNORMAL LOW (ref 4.20–5.80)
RDW: 16.1 % — ABNORMAL HIGH (ref 11.0–15.0)
Total Lymphocyte: 35 %
WBC: 2.8 10*3/uL — ABNORMAL LOW (ref 3.8–10.8)

## 2021-06-20 LAB — PATHOLOGIST SMEAR REVIEW

## 2021-06-22 ENCOUNTER — Telehealth: Payer: Self-pay | Admitting: Family Medicine

## 2021-06-22 DIAGNOSIS — D649 Anemia, unspecified: Secondary | ICD-10-CM

## 2021-06-22 NOTE — Telephone Encounter (Signed)
I placed the order and pref [place]  Please warn her that hematology and oncology are in the same specialty group so to not be surprised that there is also cancer care in the clinic she is going      If they do not hear back in a week please call us   Please let me know when his appt is , thanks

## 2021-06-22 NOTE — Telephone Encounter (Signed)
-----   Message from Helene Shoe, LPN sent at 12/02/6182  3:02 PM EST ----- I spoke with [name] (DPR signed) and notified as instructed and [name] voiced understanding. [name] said pt has been eating better for about 1 week and [name] has not seen pt yet today but yesterday pt was doing "fine". [name] said it is OK to do referral to hematologist for pt and would prefer to be seen in [place]. If cannot get appt in [facility] will be OK. Sending note to Dr [name].

## 2021-06-24 NOTE — Telephone Encounter (Signed)
Pt scheduled 07/05/21 with Hematology  Nothing further needed.

## 2021-06-30 ENCOUNTER — Encounter: Payer: Self-pay | Admitting: *Deleted

## 2021-07-05 ENCOUNTER — Inpatient Hospital Stay: Payer: Medicare Other

## 2021-07-05 ENCOUNTER — Other Ambulatory Visit: Payer: Self-pay

## 2021-07-05 ENCOUNTER — Encounter: Payer: Self-pay | Admitting: Oncology

## 2021-07-05 ENCOUNTER — Inpatient Hospital Stay: Payer: Medicare Other | Attending: Oncology | Admitting: Oncology

## 2021-07-05 VITALS — BP 91/78 | HR 63 | Temp 97.9°F | Resp 18 | Wt 196.5 lb

## 2021-07-05 DIAGNOSIS — I10 Essential (primary) hypertension: Secondary | ICD-10-CM | POA: Insufficient documentation

## 2021-07-05 DIAGNOSIS — D72819 Decreased white blood cell count, unspecified: Secondary | ICD-10-CM | POA: Insufficient documentation

## 2021-07-05 DIAGNOSIS — R6250 Unspecified lack of expected normal physiological development in childhood: Secondary | ICD-10-CM | POA: Diagnosis not present

## 2021-07-05 DIAGNOSIS — D649 Anemia, unspecified: Secondary | ICD-10-CM | POA: Diagnosis not present

## 2021-07-05 DIAGNOSIS — Z0000 Encounter for general adult medical examination without abnormal findings: Secondary | ICD-10-CM

## 2021-07-05 DIAGNOSIS — Z800 Family history of malignant neoplasm of digestive organs: Secondary | ICD-10-CM | POA: Diagnosis not present

## 2021-07-05 LAB — CBC WITH DIFFERENTIAL/PLATELET
Abs Immature Granulocytes: 0.05 10*3/uL (ref 0.00–0.07)
Basophils Absolute: 0 10*3/uL (ref 0.0–0.1)
Basophils Relative: 0 %
Eosinophils Absolute: 0 10*3/uL (ref 0.0–0.5)
Eosinophils Relative: 0 %
HCT: 31.7 % — ABNORMAL LOW (ref 39.0–52.0)
Hemoglobin: 10.5 g/dL — ABNORMAL LOW (ref 13.0–17.0)
Immature Granulocytes: 0 %
Lymphocytes Relative: 4 %
Lymphs Abs: 0.6 10*3/uL — ABNORMAL LOW (ref 0.7–4.0)
MCH: 30.3 pg (ref 26.0–34.0)
MCHC: 33.1 g/dL (ref 30.0–36.0)
MCV: 91.6 fL (ref 80.0–100.0)
Monocytes Absolute: 0.4 10*3/uL (ref 0.1–1.0)
Monocytes Relative: 3 %
Neutro Abs: 12.6 10*3/uL — ABNORMAL HIGH (ref 1.7–7.7)
Neutrophils Relative %: 93 %
Platelets: 249 10*3/uL (ref 150–400)
RBC: 3.46 MIL/uL — ABNORMAL LOW (ref 4.22–5.81)
RDW: 17.2 % — ABNORMAL HIGH (ref 11.5–15.5)
WBC: 13.7 10*3/uL — ABNORMAL HIGH (ref 4.0–10.5)
nRBC: 0 % (ref 0.0–0.2)

## 2021-07-05 LAB — VITAMIN B12: Vitamin B-12: 489 pg/mL (ref 180–914)

## 2021-07-05 LAB — TECHNOLOGIST SMEAR REVIEW: Plt Morphology: NONE SEEN

## 2021-07-05 LAB — FOLATE: Folate: 6.2 ng/mL (ref >5.9)

## 2021-07-05 LAB — HEPATITIS PANEL, ACUTE
HCV Ab: NONREACTIVE
Hep A IgM: NONREACTIVE
Hep B C IgM: NONREACTIVE
Hepatitis B Surface Ag: NONREACTIVE

## 2021-07-05 LAB — LACTATE DEHYDROGENASE: LDH: 131 U/L (ref 98–192)

## 2021-07-05 LAB — RETIC PANEL
Immature Retic Fract: 13.2 % (ref 2.3–15.9)
RBC.: 3.41 MIL/uL — ABNORMAL LOW (ref 4.22–5.81)
Retic Count, Absolute: 51.2 10*3/uL (ref 19.0–186.0)
Retic Ct Pct: 1.5 % (ref 0.4–3.1)
Reticulocyte Hemoglobin: 34.1 pg (ref >27.9)

## 2021-07-05 NOTE — Progress Notes (Signed)
Hematology/Oncology Consult note [phone] [phone]         Patient Care Team: Tower, Wynelle Fanny, MD as PCP - General Earlie Server, MD as Consulting Physician (Hematology and Oncology)  REFERRING PROVIDER: [name], Wynelle Fanny, MD  CHIEF COMPLAINTS/REASON FOR VISIT:  Evaluation of anemia  HISTORY OF PRESENTING ILLNESS:   Daniel Toya. is a  71 y.o.  male with PMH listed below was seen in consultation at the request of  Tower, Wynelle Fanny, MD  for evaluation of anemia.  Patient has developmental delay and is not competent.  He was accompanied by his cousin who is also his guardian Daniel Bailey to the clinic today.  Patient has no new complaints.  Cousin reports that patient has good appetite, no significant unintentional weight loss.  No fever or night sweats.  Patient lives at home with Daniel Bailey son.  Family history includes mother had colon cancer.  Father had cancer, unknown details. Patient had a remote colonoscopy in 2012.  Review of Systems  Unable to perform ROS: Other (Development delay)   MEDICAL HISTORY:  Past Medical History:  Diagnosis Date   Anemia    Elevated PSA    Frequent urination    Glaucoma    History of COVID-19    Hyperglycemia    Hyperlipidemia    Hypertension    Sleep apnea     SURGICAL HISTORY: Past Surgical History:  Procedure Laterality Date   COLONOSCOPY     PROSTATE BIOPSY N/A 11/20/2017   Procedure: BIOPSY TRANSRECTAL ULTRASONIC PROSTATE (TUBP);  Surgeon: Abbie Sons, MD;  Location: [facility] ORS;  Service: Urology;  Laterality: N/A;    SOCIAL HISTORY: Social History   Socioeconomic History   Marital status: Married    Spouse name: Not on file   Number of children: Not on file   Years of education: Not on file   Highest education level: Not on file  Occupational History   Not on file  Tobacco Use   Smoking status: Never   Smokeless tobacco: Never  Vaping Use   Vaping Use: Never used  Substance and Sexual Activity    Alcohol use: No    Alcohol/week: 0.0 standard drinks   Drug use: No   Sexual activity: Not Currently  Other Topics Concern   Not on file  Social History Narrative   Not on file   Social Determinants of Health   Financial Resource Strain: Low Risk    Difficulty of Paying Living Expenses: Not hard at all  Food Insecurity: No Food Insecurity   Worried About Charity fundraiser in the Last Year: Never true   [place] in the Last Year: Never true  Transportation Needs: No Transportation Needs   Lack of Transportation (Medical): No   Lack of Transportation (Non-Medical): No  Physical Activity: Inactive   Days of Exercise per Week: 0 days   Minutes of Exercise per Session: 0 min  Stress: No Stress Concern Present   Feeling of Stress : Not at all  Social Connections: Not on file  Intimate Partner Violence: Not At Risk   Fear of Current or Ex-Partner: No   Emotionally Abused: No   Physically Abused: No   Sexually Abused: No    FAMILY HISTORY: Family History  Problem Relation Age of Onset   Colon cancer Mother    Kidney disease Mother    Cancer Father        unknown    ALLERGIES:  has No  Known Allergies.  MEDICATIONS:  Current Outpatient Medications  Medication Sig Dispense Refill   acetaminophen (TYLENOL) 500 MG tablet Take 1,000 mg by mouth daily as needed for headache.     atorvastatin (LIPITOR) 10 MG tablet Take 1 tablet (10 mg total) by mouth daily. In the evening 90 tablet 3   gemfibrozil (LOPID) 600 MG tablet Take 1 tablet (600 mg total) by mouth 2 (two) times daily. 180 tablet 3   lisinopril (ZESTRIL) 5 MG tablet Take 0.5 tablets (2.5 mg total) by mouth daily. 45 tablet 3   metFORMIN (GLUCOPHAGE-XR) 500 MG 24 hr tablet Take 2 tablets (1,000 mg total) by mouth daily with breakfast. 180 tablet 3   No current facility-administered medications for this visit.     PHYSICAL EXAMINATION: ECOG PERFORMANCE STATUS: 0 - Asymptomatic Vitals:   07/05/21 1608  BP:  91/78  Pulse: 63  Resp: 18  Temp: 97.9 F (36.6 C)   Filed Weights   07/05/21 1608  Weight: 196 lb 8 oz (89.1 kg)    Physical Exam Constitutional:      General: He is not in acute distress. HENT:     Head: Normocephalic and atraumatic.  Eyes:     General: No scleral icterus. Cardiovascular:     Rate and Rhythm: Normal rate and regular rhythm.     Heart sounds: Normal heart sounds.  Pulmonary:     Effort: Pulmonary effort is normal. No respiratory distress.     Breath sounds: No wheezing.  Abdominal:     General: Bowel sounds are normal. There is no distension.     Palpations: Abdomen is soft.  Musculoskeletal:        General: No deformity. Normal range of motion.     Cervical back: Normal range of motion and neck supple.  Skin:    General: Skin is warm and dry.     Findings: No erythema or rash.  Neurological:     Mental Status: He is alert. Mental status is at baseline.     Cranial Nerves: No cranial nerve deficit.     Coordination: Coordination normal.     Comments: Patient answer simple questions.  Follows commands  Psychiatric:        Mood and Affect: Mood normal.    LABORATORY DATA:  I have reviewed the data as listed Lab Results  Component Value Date   WBC 13.7 (H) 07/05/2021   HGB 10.5 (L) 07/05/2021   HCT 31.7 (L) 07/05/2021   MCV 91.6 07/05/2021   PLT 249 07/05/2021   Recent Labs    07/16/20 1238 02/09/21 1007 05/17/21 1538 05/31/21 1614 06/17/21 1610  NA 136 136 129* 133* 134*  K 3.7 4.1 4.5 5.0 4.8  CL 100 98 94* 96 100  CO2 $Re'26 27 27 27 25  'rjD$ GLUCOSE 100* 79 84 81 83  BUN 14 18 24* 23 25  CREATININE 1.11 1.11 1.30 1.31 1.32*  CALCIUM 9.5 9.8 9.8 10.4 9.5  PROT 7.7 7.1  --   --   --   ALBUMIN 4.3 4.1  --   --   --   AST 27 29  --   --   --   ALT 21 17  --   --   --   ALKPHOS 77 88  --   --   --   BILITOT 0.5 0.3  --   --   --    Iron/TIBC/Ferritin/ %Sat    Component Value Date/Time   IRON 102  05/31/2021 1614   FERRITIN 228.5  05/31/2021 1614      RADIOGRAPHIC STUDIES: I have personally reviewed the radiological images as listed and agreed with the findings in the report. CT HEAD WO CONTRAST (5MM)  Result Date: 06/07/2021 CLINICAL DATA:  Altered mental status EXAM: CT HEAD WITHOUT CONTRAST TECHNIQUE: Contiguous axial images were obtained from the base of the skull through the vertex without intravenous contrast. RADIATION DOSE REDUCTION: This exam was performed according to the departmental dose-optimization program which includes automated exposure control, adjustment of the mA and/or kV according to patient size and/or use of iterative reconstruction technique. COMPARISON:  None. FINDINGS: Brain: No acute intracranial findings are seen. Ventricles are not dilated. There is no shift of midline structures. There are no epidural or subdural fluid collections. Vascular: Unremarkable. Skull: Unremarkable. Sinuses/Orbits: Unremarkable. Other: None IMPRESSION: No acute intracranial findings are seen in noncontrast CT brain. Electronically Signed   By: Elmer Picker M.D.   On: 06/07/2021 11:10      ASSESSMENT & PLAN:  1. Normocytic anemia   2. Leukopenia, unspecified type   3. Routine general medical examination at a health care facility    #Normocytic anemia, Ferritin is normal. Check vitamin B12, folate, CBC, smear, multiple myeloma panel, light chain ratio.  LDH.  Copper  #Leukopenia, mild.  Check hepatitis panel.  Flow cytometry.  Orders Placed This Encounter  Procedures   Vitamin B12    Standing Status:   Future    Number of Occurrences:   1    Standing Expiration Date:   07/05/2022   Folate    Standing Status:   Future    Number of Occurrences:   1    Standing Expiration Date:   07/05/2022   Technologist smear review    Standing Status:   Future    Number of Occurrences:   1    Standing Expiration Date:   07/05/2022   CBC with Differential/Platelet    Standing Status:   Future    Number of  Occurrences:   1    Standing Expiration Date:   07/05/2022   Retic Panel    Standing Status:   Future    Number of Occurrences:   1    Standing Expiration Date:   07/05/2022   Multiple Myeloma Panel (SPEP&IFE w/QIG)    Standing Status:   Future    Number of Occurrences:   1    Standing Expiration Date:   07/05/2022   Kappa/lambda light chains    Standing Status:   Future    Number of Occurrences:   1    Standing Expiration Date:   07/05/2022   Lactate dehydrogenase    Standing Status:   Future    Number of Occurrences:   1    Standing Expiration Date:   07/05/2022   Copper, serum    Standing Status:   Future    Number of Occurrences:   1    Standing Expiration Date:   07/05/2022   Flow cytometry panel-leukemia/lymphoma work-up    Standing Status:   Future    Number of Occurrences:   1    Standing Expiration Date:   07/05/2022   Hepatitis panel, acute    Standing Status:   Future    Number of Occurrences:   1    Standing Expiration Date:   07/05/2022    All questions were answered. The patient knows to call the clinic with any problems questions or concerns.  CC  Tower, Wynelle Fanny, MD    Return of visit: 4 weeks to review results. Thank you for this kind referral and the opportunity to participate in the care of this patient. A copy of today's note is routed to referring provider   Earlie Server, MD, PhD [facility] Hematology Oncology 07/05/2021

## 2021-07-05 NOTE — Progress Notes (Signed)
Patient here to establish care anemia. Pt here today with cousin [name], who is also legal guardian.

## 2021-07-06 LAB — COPPER, SERUM: Copper: 143 ug/dL — ABNORMAL HIGH (ref 69–132)

## 2021-07-06 LAB — KAPPA/LAMBDA LIGHT CHAINS
Kappa free light chain: 79.3 mg/L — ABNORMAL HIGH (ref 3.3–19.4)
Kappa, lambda light chain ratio: 2.32 — ABNORMAL HIGH (ref 0.26–1.65)
Lambda free light chains: 34.2 mg/L — ABNORMAL HIGH (ref 5.7–26.3)

## 2021-07-07 LAB — COMP PANEL: LEUKEMIA/LYMPHOMA

## 2021-07-08 LAB — MULTIPLE MYELOMA PANEL, SERUM
Albumin SerPl Elph-Mcnc: 3.6 g/dL (ref 2.9–4.4)
Albumin/Glob SerPl: 1.1 (ref 0.7–1.7)
Alpha 1: 0.4 g/dL (ref 0.0–0.4)
Alpha2 Glob SerPl Elph-Mcnc: 0.9 g/dL (ref 0.4–1.0)
B-Globulin SerPl Elph-Mcnc: 1.2 g/dL (ref 0.7–1.3)
Gamma Glob SerPl Elph-Mcnc: 1.1 g/dL (ref 0.4–1.8)
Globulin, Total: 3.6 g/dL (ref 2.2–3.9)
IgA: 300 mg/dL (ref 61–437)
IgG (Immunoglobin G), Serum: 1126 mg/dL (ref 603–1613)
IgM (Immunoglobulin M), Srm: 76 mg/dL (ref 15–143)
Total Protein ELP: 7.2 g/dL (ref 6.0–8.5)

## 2021-07-12 ENCOUNTER — Emergency Department: Payer: Medicare Other

## 2021-07-12 ENCOUNTER — Other Ambulatory Visit: Payer: Self-pay

## 2021-07-12 ENCOUNTER — Inpatient Hospital Stay
Admission: EM | Admit: 2021-07-12 | Discharge: 2021-07-18 | DRG: 871 | Disposition: A | Discharge status: Home Health | Payer: Medicare Other | Attending: Internal Medicine | Admitting: Internal Medicine

## 2021-07-12 DIAGNOSIS — R000 Tachycardia, unspecified: Secondary | ICD-10-CM | POA: Diagnosis not present

## 2021-07-12 DIAGNOSIS — R32 Unspecified urinary incontinence: Secondary | ICD-10-CM | POA: Diagnosis not present

## 2021-07-12 DIAGNOSIS — I9589 Other hypotension: Secondary | ICD-10-CM | POA: Diagnosis not present

## 2021-07-12 DIAGNOSIS — Z8616 Personal history of COVID-19: Secondary | ICD-10-CM

## 2021-07-12 DIAGNOSIS — D6489 Other specified anemias: Secondary | ICD-10-CM | POA: Diagnosis present

## 2021-07-12 DIAGNOSIS — N179 Acute kidney failure, unspecified: Secondary | ICD-10-CM | POA: Diagnosis not present

## 2021-07-12 DIAGNOSIS — Z66 Do not resuscitate: Secondary | ICD-10-CM | POA: Diagnosis not present

## 2021-07-12 DIAGNOSIS — D509 Iron deficiency anemia, unspecified: Secondary | ICD-10-CM

## 2021-07-12 DIAGNOSIS — E669 Obesity, unspecified: Secondary | ICD-10-CM | POA: Diagnosis not present

## 2021-07-12 DIAGNOSIS — E875 Hyperkalemia: Secondary | ICD-10-CM | POA: Diagnosis not present

## 2021-07-12 DIAGNOSIS — G4733 Obstructive sleep apnea (adult) (pediatric): Secondary | ICD-10-CM

## 2021-07-12 DIAGNOSIS — D649 Anemia, unspecified: Secondary | ICD-10-CM | POA: Diagnosis present

## 2021-07-12 DIAGNOSIS — E785 Hyperlipidemia, unspecified: Secondary | ICD-10-CM | POA: Diagnosis present

## 2021-07-12 DIAGNOSIS — E119 Type 2 diabetes mellitus without complications: Secondary | ICD-10-CM | POA: Diagnosis present

## 2021-07-12 DIAGNOSIS — E1139 Type 2 diabetes mellitus with other diabetic ophthalmic complication: Secondary | ICD-10-CM | POA: Diagnosis not present

## 2021-07-12 DIAGNOSIS — G9341 Metabolic encephalopathy: Secondary | ICD-10-CM

## 2021-07-12 DIAGNOSIS — I10 Essential (primary) hypertension: Secondary | ICD-10-CM | POA: Diagnosis present

## 2021-07-12 DIAGNOSIS — J9601 Acute respiratory failure with hypoxia: Secondary | ICD-10-CM | POA: Diagnosis present

## 2021-07-12 DIAGNOSIS — E876 Hypokalemia: Secondary | ICD-10-CM | POA: Diagnosis not present

## 2021-07-12 DIAGNOSIS — E861 Hypovolemia: Secondary | ICD-10-CM | POA: Diagnosis not present

## 2021-07-12 DIAGNOSIS — Z800 Family history of malignant neoplasm of digestive organs: Secondary | ICD-10-CM | POA: Diagnosis not present

## 2021-07-12 DIAGNOSIS — H409 Unspecified glaucoma: Secondary | ICD-10-CM | POA: Diagnosis present

## 2021-07-12 DIAGNOSIS — A419 Sepsis, unspecified organism: Principal | ICD-10-CM | POA: Diagnosis present

## 2021-07-12 DIAGNOSIS — J690 Pneumonitis due to inhalation of food and vomit: Secondary | ICD-10-CM | POA: Diagnosis present

## 2021-07-12 DIAGNOSIS — J188 Other pneumonia, unspecified organism: Secondary | ICD-10-CM | POA: Diagnosis not present

## 2021-07-12 DIAGNOSIS — E8770 Fluid overload, unspecified: Secondary | ICD-10-CM | POA: Diagnosis present

## 2021-07-12 DIAGNOSIS — R404 Transient alteration of awareness: Secondary | ICD-10-CM | POA: Diagnosis not present

## 2021-07-12 DIAGNOSIS — T68XXXA Hypothermia, initial encounter: Secondary | ICD-10-CM

## 2021-07-12 DIAGNOSIS — Z7984 Long term (current) use of oral hypoglycemic drugs: Secondary | ICD-10-CM

## 2021-07-12 DIAGNOSIS — R6520 Severe sepsis without septic shock: Secondary | ICD-10-CM | POA: Diagnosis not present

## 2021-07-12 DIAGNOSIS — J189 Pneumonia, unspecified organism: Secondary | ICD-10-CM | POA: Diagnosis not present

## 2021-07-12 DIAGNOSIS — E872 Acidosis, unspecified: Secondary | ICD-10-CM | POA: Diagnosis not present

## 2021-07-12 DIAGNOSIS — R4182 Altered mental status, unspecified: Principal | ICD-10-CM

## 2021-07-12 DIAGNOSIS — R1111 Vomiting without nausea: Secondary | ICD-10-CM | POA: Diagnosis not present

## 2021-07-12 DIAGNOSIS — Z79899 Other long term (current) drug therapy: Secondary | ICD-10-CM

## 2021-07-12 DIAGNOSIS — T68XXXD Hypothermia, subsequent encounter: Secondary | ICD-10-CM | POA: Diagnosis not present

## 2021-07-12 DIAGNOSIS — R6259 Other lack of expected normal physiological development in childhood: Secondary | ICD-10-CM | POA: Diagnosis present

## 2021-07-12 DIAGNOSIS — R739 Hyperglycemia, unspecified: Secondary | ICD-10-CM | POA: Diagnosis not present

## 2021-07-12 DIAGNOSIS — I959 Hypotension, unspecified: Secondary | ICD-10-CM | POA: Insufficient documentation

## 2021-07-12 LAB — BASIC METABOLIC PANEL
Anion gap: 10 (ref 5–15)
BUN: 42 mg/dL — ABNORMAL HIGH (ref 8–23)
CO2: 21 mmol/L — ABNORMAL LOW (ref 22–32)
Calcium: 8.4 mg/dL — ABNORMAL LOW (ref 8.9–10.3)
Chloride: 107 mmol/L (ref 98–111)
Creatinine, Ser: 2.21 mg/dL — ABNORMAL HIGH (ref 0.61–1.24)
GFR, Estimated: 31 mL/min — ABNORMAL LOW (ref >60)
Glucose, Bld: 160 mg/dL — ABNORMAL HIGH (ref 70–99)
Potassium: 5 mmol/L (ref 3.5–5.1)
Sodium: 138 mmol/L (ref 135–145)

## 2021-07-12 LAB — LACTIC ACID, PLASMA
Lactic Acid, Venous: 3.2 mmol/L (ref 0.5–1.9)
Lactic Acid, Venous: 2.7 mmol/L (ref 0.5–1.9)
Lactic Acid, Venous: 1.2 mmol/L (ref 0.5–1.9)

## 2021-07-12 LAB — CBC WITH DIFFERENTIAL/PLATELET
Abs Immature Granulocytes: 0.08 10*3/uL — ABNORMAL HIGH (ref 0.00–0.07)
Basophils Absolute: 0.1 10*3/uL (ref 0.0–0.1)
Basophils Relative: 0 %
Eosinophils Absolute: 0 10*3/uL (ref 0.0–0.5)
Eosinophils Relative: 0 %
HCT: 28.8 % — ABNORMAL LOW (ref 39.0–52.0)
Hemoglobin: 9.8 g/dL — ABNORMAL LOW (ref 13.0–17.0)
Immature Granulocytes: 1 %
Lymphocytes Relative: 2 %
Lymphs Abs: 0.4 10*3/uL — ABNORMAL LOW (ref 0.7–4.0)
MCH: 30.9 pg (ref 26.0–34.0)
MCHC: 34 g/dL (ref 30.0–36.0)
MCV: 90.9 fL (ref 80.0–100.0)
Monocytes Absolute: 0.7 10*3/uL (ref 0.1–1.0)
Monocytes Relative: 4 %
Neutro Abs: 15.9 10*3/uL — ABNORMAL HIGH (ref 1.7–7.7)
Neutrophils Relative %: 93 %
Platelets: 284 10*3/uL (ref 150–400)
RBC: 3.17 MIL/uL — ABNORMAL LOW (ref 4.22–5.81)
RDW: 17.3 % — ABNORMAL HIGH (ref 11.5–15.5)
WBC: 17.1 10*3/uL — ABNORMAL HIGH (ref 4.0–10.5)
nRBC: 0 % (ref 0.0–0.2)

## 2021-07-12 LAB — COMPREHENSIVE METABOLIC PANEL
ALT: 26 U/L (ref 0–44)
AST: 62 U/L — ABNORMAL HIGH (ref 15–41)
Albumin: 3.5 g/dL (ref 3.5–5.0)
Alkaline Phosphatase: 104 U/L (ref 38–126)
Anion gap: 15 (ref 5–15)
BUN: 41 mg/dL — ABNORMAL HIGH (ref 8–23)
CO2: 25 mmol/L (ref 22–32)
Calcium: 9.6 mg/dL (ref 8.9–10.3)
Chloride: 96 mmol/L — ABNORMAL LOW (ref 98–111)
Creatinine, Ser: 2.83 mg/dL — ABNORMAL HIGH (ref 0.61–1.24)
GFR, Estimated: 23 mL/min — ABNORMAL LOW (ref >60)
Glucose, Bld: 239 mg/dL — ABNORMAL HIGH (ref 70–99)
Potassium: 6.2 mmol/L — ABNORMAL HIGH (ref 3.5–5.1)
Sodium: 136 mmol/L (ref 135–145)
Total Bilirubin: 0.7 mg/dL (ref 0.3–1.2)
Total Protein: 7.8 g/dL (ref 6.5–8.1)

## 2021-07-12 LAB — URINALYSIS, COMPLETE (UACMP) WITH MICROSCOPIC
Bacteria, UA: NONE SEEN
Bilirubin Urine: NEGATIVE
Glucose, UA: NEGATIVE mg/dL
Hgb urine dipstick: NEGATIVE
Ketones, ur: NEGATIVE mg/dL
Leukocytes,Ua: NEGATIVE
Nitrite: NEGATIVE
Protein, ur: 30 mg/dL — AB
Specific Gravity, Urine: 1.011 (ref 1.005–1.030)
pH: 6 (ref 5.0–8.0)

## 2021-07-12 LAB — CBG MONITORING, ED
Glucose-Capillary: 145 mg/dL — ABNORMAL HIGH (ref 70–99)
Glucose-Capillary: 150 mg/dL — ABNORMAL HIGH (ref 70–99)

## 2021-07-12 LAB — RESP PANEL BY RT-PCR (FLU A&B, COVID) ARPGX2
Influenza A by PCR: NEGATIVE
Influenza B by PCR: NEGATIVE
SARS Coronavirus 2 by RT PCR: NEGATIVE

## 2021-07-12 LAB — TROPONIN I (HIGH SENSITIVITY)
Troponin I (High Sensitivity): 11 ng/L (ref <18)
Troponin I (High Sensitivity): 10 ng/L (ref <18)

## 2021-07-12 LAB — PROTIME-INR
INR: 1 (ref 0.8–1.2)
Prothrombin Time: 13.2 seconds (ref 11.4–15.2)

## 2021-07-12 LAB — LIPASE, BLOOD: Lipase: 53 U/L — ABNORMAL HIGH (ref 11–51)

## 2021-07-12 LAB — AMMONIA: Ammonia: 32 umol/L (ref 9–35)

## 2021-07-12 LAB — STREP PNEUMONIAE URINARY ANTIGEN: Strep Pneumo Urinary Antigen: NEGATIVE

## 2021-07-12 LAB — PROCALCITONIN: Procalcitonin: 2.35 ng/mL

## 2021-07-12 MED ORDER — SODIUM CHLORIDE 0.9 % IV SOLN: 3.0000 g | Freq: Two times a day (BID) | INTRAVENOUS | Status: DC

## 2021-07-12 MED ORDER — ALBUTEROL SULFATE (2.5 MG/3ML) 0.083% IN NEBU
2.5000 mg | INHALATION_SOLUTION | RESPIRATORY_TRACT | Status: DC
Start: 2021-07-12 — End: 2021-07-13
  Administered 2021-07-12 – 2021-07-13 (×6): 2.5 mg via RESPIRATORY_TRACT
  Filled 2021-07-12 (×7): qty 3

## 2021-07-12 MED ORDER — ACETAMINOPHEN 650 MG RE SUPP: 650.0000 mg | Freq: Four times a day (QID) | RECTAL | Status: DC | PRN

## 2021-07-12 MED ORDER — SODIUM ZIRCONIUM CYCLOSILICATE 5 G PO PACK
5.0000 g | PACK | Freq: Two times a day (BID) | ORAL | Status: AC
  Administered 2021-07-12 (×2): 5 g via ORAL
  Filled 2021-07-12 (×2): qty 1

## 2021-07-12 MED ORDER — ONDANSETRON HCL 4 MG/2ML IJ SOLN: 4.0000 mg | Freq: Once | INTRAMUSCULAR | Status: AC

## 2021-07-12 MED ORDER — SODIUM CHLORIDE 0.9 % IV SOLN
1.0000 g | Freq: Once | INTRAVENOUS | Status: AC
  Administered 2021-07-12: 1 g via INTRAVENOUS
  Filled 2021-07-12: qty 10

## 2021-07-12 MED ORDER — SODIUM CHLORIDE 0.9 % IV SOLN
INTRAVENOUS | Status: AC
Start: 2021-07-12 — End: 2021-07-12

## 2021-07-12 MED ORDER — SODIUM CHLORIDE 0.9 % IV BOLUS
500.0000 mL | Freq: Once | INTRAVENOUS | Status: AC
  Administered 2021-07-12: 500 mL via INTRAVENOUS

## 2021-07-12 MED ORDER — BISACODYL 5 MG PO TBEC: 5.0000 mg | DELAYED_RELEASE_TABLET | Freq: Every day | ORAL | Status: DC | PRN

## 2021-07-12 MED ORDER — SODIUM CHLORIDE 0.9 % IV SOLN
3.0000 g | Freq: Four times a day (QID) | INTRAVENOUS | Status: DC
  Administered 2021-07-12 – 2021-07-16 (×13): 3 g via INTRAVENOUS
  Filled 2021-07-12 (×2): qty 8
  Filled 2021-07-12 (×2): qty 3
  Filled 2021-07-12 (×2): qty 8
  Filled 2021-07-12 (×2): qty 3
  Filled 2021-07-12: qty 8
  Filled 2021-07-12: qty 3
  Filled 2021-07-12 (×2): qty 8
  Filled 2021-07-12 (×2): qty 3
  Filled 2021-07-12: qty 8
  Filled 2021-07-12: qty 3
  Filled 2021-07-12: qty 8

## 2021-07-12 MED ORDER — ACETAMINOPHEN 325 MG PO TABS: 650.0000 mg | ORAL_TABLET | Freq: Four times a day (QID) | ORAL | Status: DC | PRN

## 2021-07-12 MED ORDER — DEXTROSE 50 % IV SOLN
1.0000 | Freq: Once | INTRAVENOUS | Status: AC
  Administered 2021-07-12: 50 mL via INTRAVENOUS
  Filled 2021-07-12: qty 50

## 2021-07-12 MED ORDER — SODIUM CHLORIDE 0.9 % IV SOLN
500.0000 mg | Freq: Once | INTRAVENOUS | Status: AC
  Administered 2021-07-12: 500 mg via INTRAVENOUS
  Filled 2021-07-12: qty 5

## 2021-07-12 MED ORDER — CALCIUM GLUCONATE-NACL 1-0.675 GM/50ML-% IV SOLN
1.0000 g | Freq: Once | INTRAVENOUS | Status: AC
Start: 2021-07-12 — End: 2021-07-12
  Administered 2021-07-12: 1000 mg via INTRAVENOUS
  Filled 2021-07-12: qty 50

## 2021-07-12 MED ORDER — ONDANSETRON HCL 4 MG/2ML IJ SOLN
INTRAMUSCULAR | Status: AC
  Administered 2021-07-12: 4 mg via INTRAMUSCULAR
  Filled 2021-07-12: qty 2

## 2021-07-12 MED ORDER — MIDODRINE HCL 5 MG PO TABS
10.0000 mg | ORAL_TABLET | Freq: Three times a day (TID) | ORAL | Status: DC
Start: 2021-07-12 — End: 2021-07-14
  Administered 2021-07-12 – 2021-07-14 (×4): 10 mg via ORAL
  Filled 2021-07-12 (×4): qty 2

## 2021-07-12 MED ORDER — ONDANSETRON HCL 4 MG/2ML IJ SOLN
4.0000 mg | Freq: Four times a day (QID) | INTRAMUSCULAR | Status: DC | PRN
  Administered 2021-07-12 (×2): 4 mg via INTRAVENOUS
  Filled 2021-07-12 (×2): qty 2

## 2021-07-12 MED ORDER — POLYETHYLENE GLYCOL 3350 17 G PO PACK
17.0000 g | PACK | Freq: Every day | ORAL | Status: DC
  Administered 2021-07-13 – 2021-07-18 (×6): 17 g via ORAL
  Filled 2021-07-12 (×6): qty 1

## 2021-07-12 MED ORDER — INSULIN ASPART 100 UNIT/ML IJ SOLN
10.0000 [IU] | Freq: Once | INTRAMUSCULAR | Status: AC
  Administered 2021-07-12: 10 [IU] via INTRAVENOUS
  Filled 2021-07-12: qty 1

## 2021-07-12 MED ORDER — GEMFIBROZIL 600 MG PO TABS
600.0000 mg | ORAL_TABLET | Freq: Two times a day (BID) | ORAL | Status: DC
  Administered 2021-07-13 – 2021-07-18 (×11): 600 mg via ORAL
  Filled 2021-07-12 (×14): qty 1

## 2021-07-12 MED ORDER — SODIUM CHLORIDE 0.9 % IV BOLUS
1000.0000 mL | Freq: Once | INTRAVENOUS | Status: AC
  Administered 2021-07-12: 1000 mL via INTRAVENOUS

## 2021-07-12 MED ORDER — ATORVASTATIN CALCIUM 10 MG PO TABS
10.0000 mg | ORAL_TABLET | Freq: Every day | ORAL | Status: DC
  Administered 2021-07-12 – 2021-07-17 (×6): 10 mg via ORAL
  Filled 2021-07-12 (×6): qty 1

## 2021-07-12 MED ORDER — METRONIDAZOLE 500 MG/100ML IV SOLN
500.0000 mg | Freq: Once | INTRAVENOUS | Status: AC
  Administered 2021-07-12: 500 mg via INTRAVENOUS
  Filled 2021-07-12: qty 100

## 2021-07-12 MED ORDER — SODIUM CHLORIDE 0.9 % IV BOLUS
2000.0000 mL | Freq: Once | INTRAVENOUS | Status: AC
Start: 2021-07-12 — End: 2021-07-12
  Administered 2021-07-12: 2000 mL via INTRAVENOUS

## 2021-07-12 MED ORDER — HEPARIN SODIUM (PORCINE) 5000 UNIT/ML IJ SOLN
5000.0000 [IU] | Freq: Three times a day (TID) | INTRAMUSCULAR | Status: DC
  Administered 2021-07-12 – 2021-07-18 (×19): 5000 [IU] via SUBCUTANEOUS
  Filled 2021-07-12 (×18): qty 1

## 2021-07-12 NOTE — ED Notes (Signed)
Pt had another large episode of dark emesis. Pt cleaned up and clean gown and linens changed. Pt repositioned at this time. ?

## 2021-07-12 NOTE — ED Triage Notes (Signed)
Family called EMS for reported AMS and projectile vomiting. Last known normal apx yesterday evening. Baseline GCS 15 per family.  ?

## 2021-07-12 NOTE — ED Notes (Signed)
Pt vomited large amount of dark emesis. MD at bedside, zofran given and pt cleaned up. New linens placed and pt placed in gown, brief and clean chux. Pt repositioned and sat up. Family at bedside. Blankets given. ?

## 2021-07-12 NOTE — ED Notes (Signed)
Pt given apple sauce

## 2021-07-12 NOTE — ED Notes (Signed)
Critical result informed to MD Bradler of Lactic 3.2 ?

## 2021-07-12 NOTE — ED Provider Notes (Signed)
? ?[facility] ?Provider Note ? ? ? Event Date/Time  ? First MD Initiated Contact with Patient 07/12/21 [id number]   ?  (approximate) ? ? ?History  ? ?Altered Mental Status ? ? ?HPI ? ?Daniel Bailey. is a 71 y.o. male past medical history of developmental delay, anemia, hypertension, hyperlipidemia, diabetes and sleep apnea who presents with altered mental status.  He is accompanied by his cousin who is his caregiver as well.  Patient unable to provide much history given his mental status change.  Apparently at his baseline patient is able to walk talk and is alert and oriented and can feed himself and clothe himself.  Since December he has had intermittent episodes where he has tremors and confusion.  They have been seeing the primary doctor for this he has not had been hospitalized for it.  Since Saturday he has had decreased p.o. intake and then starting Sunday had some tremors and this confusion as well.  Yesterday he required assistance with ambulation which is not normal for him.  They were trying to arrange for him to see the primary doctor today but then around 5 AM he was found to have had projectile vomiting just one episode.  Today brought him to the emergency department.  Patient denies chest pain abdominal pain nausea vomiting or difficulty breathing.  Per his cousin he has had a cough for several weeks. ? ?  ? ?Past Medical History:  ?Diagnosis Date  ? Anemia   ? Elevated PSA   ? Frequent urination   ? Glaucoma   ? History of COVID-19   ? Hyperglycemia   ? Hyperlipidemia   ? Hypertension   ? Sleep apnea   ? ? ?Patient Active Problem List  ? Diagnosis Date Noted  ? Hyponatremia 05/26/2021  ? Decreased GFR 05/26/2021  ? Anemia 05/26/2021  ? Cerumen impaction 05/17/2021  ? Change in mental status 05/17/2021  ? Frequent urination 05/17/2021  ? Tremor 02/09/2021  ? Generalized weakness 02/09/2021  ? Ataxia 02/09/2021  ? Elevated PSA 09/05/2017  ? Leukocytopenia 09/01/2016  ? 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 Encounter for Medicare annual wellness exam 08/27/2012  ? Obesity 08/18/2011  ? Colon cancer screening 08/18/2011  ? Routine general medical examination at a health care facility 08/10/2011  ? Prostate cancer screening 08/10/2011  ? Controlled type 2 diabetes mellitus without complication, without long-term current use of insulin ([place]) 05/30/2010  ? HYPERTENSION, BENIGN ESSENTIAL 12/10/2006  ? Intellectual disability 11/23/2006  ? ? ? ?Physical Exam  ?Triage Vital Signs: ?ED Triage Vitals  ?Enc Vitals Group  ?   BP 07/12/21 0633 (!) 119/107  ?   Pulse Rate 07/12/21 0633 60  ?   Resp 07/12/21 0639 20  ?   Temp 07/12/21 0633 98.6 ?F (37 ?C)  ?   Temp Source 07/12/21 0633 Oral  ?   SpO2 07/12/21 0633 94 %  ?   Weight --   ?   Height --   ?   Head Circumference --   ?   Peak Flow --   ?   Pain Score --   ?   Pain Loc --   ?   Pain Edu? --   ?   Excl. in [place]? --   ? ? ?Most recent vital signs: ?Vitals:  ? 07/12/21 0712 07/12/21 0715  ?BP: (!) 93/59   ?Pulse: 92 70  ?Resp: 20 (!) 26  ?Temp:    ?  SpO2: 93% 93%  ? ? ? ?General: Awake, no distress.  ?CV:  Good peripheral perfusion.  No edema ?Resp:  Normal effort.  Patient is tachypneic, frequent wet sounding cough ?Abd:  No distention.  Umbilical hernia is soft and reducible, abdomen is benign ?Neuro:             Patient's eyes are closed, opens eyes to voice, able to follow simple commands, when arms are lifted up for him he is able to hold for several seconds, same with his legs, pupils are equal round reactive to light, face is symmetric, normal tongue movement ?Diffusely tremulous ?Other:   ? ? ?ED Results / Procedures / Treatments  ?Labs ?(all labs ordered are listed, but only abnormal results are displayed) ?Labs Reviewed  ?CBC WITH DIFFERENTIAL/PLATELET - Abnormal; Notable for the following components:  ?    Result Value  ? WBC 17.1 (*)   ? RBC 3.17 (*)   ? Hemoglobin 9.8 (*)   ? HCT 28.8 (*)   ? RDW  17.3 (*)   ? Neutro Abs 15.9 (*)   ? Lymphs Abs 0.4 (*)   ? Abs Immature Granulocytes 0.08 (*)   ? All other components within normal limits  ?RESP PANEL BY RT-PCR (FLU A&B, COVID) [id number]  ?CULTURE, BLOOD (ROUTINE X 2)  ?CULTURE, BLOOD (ROUTINE X 2)  ?PROTIME-INR  ?COMPREHENSIVE METABOLIC PANEL  ?AMMONIA  ?URINALYSIS, COMPLETE (UACMP) WITH MICROSCOPIC  ?LIPASE, BLOOD  ?LACTIC ACID, PLASMA  ?LACTIC ACID, PLASMA  ?TROPONIN I (HIGH SENSITIVITY)  ? ? ? ?EKG ? ?EKG interpreted by myself, sinus tachycardia, right bundle branch block, significant baseline artifact ? ? ?RADIOLOGY ?Reviewed the patient's chest x-ray, read by radiology as retrocardiac infiltrate ? ? ?PROCEDURES: ? ?Critical Care performed: No ? ?.1-3 Lead EKG Interpretation ?Performed by: Rada Hay, MD ?Authorized by: Rada Hay, MD  ? ?  Interpretation: normal   ?  ECG rate assessment: normal   ?  Rhythm: sinus rhythm   ?  Ectopy: none   ?  Conduction: normal   ? ?The patient is on the cardiac monitor to evaluate for evidence of arrhythmia and/or significant heart rate changes. ? ? ?MEDICATIONS ORDERED IN ED: ?Medications - No data to display ? ? ?IMPRESSION / MDM / ASSESSMENT AND PLAN / ED COURSE  ?I reviewed the triage vital signs and the nursing notes. ?             ?               ? ?Differential diagnosis includes, but is not limited to, infection, UTI, pneumonia, metabolic encephalopathy, CVA, ICH ? ?Patient is a 71 year old male who presents with altered mental status.  He has developmental delay but apparently per his caretaker at baseline he is mostly independent alert and oriented.  Since December has had intermittent episodes where he becomes confused and tremulous.  His change in mental status seems to have started 2 days ago with decreased p.o. intake tremors confusion and more difficulty ambulating.  Patient had an episode of emesis today.  Vital signs are notable for borderline tachycardia and hypoxia with tachypnea.  He has  frequent cough which is present says is been going on for several weeks.  Abdomen is soft and nontender he has a reducible umbilical hernia.  His neurologic exam is notable for confusion, oriented to person and place only and diffuse tremors however does not appear to be focal as he is able to hold up  his arms and legs is no facial droop.  Differential is broad at this point including primary neurologic infectious metabolic abnormalities.  Will obtain labs CT head chest x-ray.  Signed out to oncoming provider in the majority of his work-up and reassessment.  Anticipate admission given his significant change from baseline. ?  ? ? ?FINAL CLINICAL IMPRESSION(S) / ED DIAGNOSES  ? ?Final diagnoses:  ?Altered mental status, unspecified altered mental status type  ? ? ? ?Rx / DC Orders  ? ?ED Discharge Orders   ? ? None  ? ?  ? ? ? ?Note:  This document was prepared using Dragon voice recognition software and may include unintentional dictation errors. ?  ?Rada Hay, MD ?07/12/21 1751 ? ?

## 2021-07-12 NOTE — Progress Notes (Signed)
PHARMACY NOTE:  ANTIMICROBIAL RENAL DOSAGE ADJUSTMENT ? ?Current antimicrobial regimen includes a mismatch between antimicrobial dosage and estimated renal function.  As per policy approved by the Pharmacy & Therapeutics and Medical Executive Committees, the antimicrobial dosage will be adjusted accordingly. ? ?Current antimicrobial dosage:  Unasyn 3 g IV q12h ? ?Indication: Aspiration pneumonia ? ?Renal Function: ? ?Estimated Creatinine Clearance: 32.3 mL/min (A) (by C-G formula based on SCr of 2.21 mg/dL (H)). ? ?Antimicrobial dosage has been changed to:  Unasyn 3 g IV q6h ? ?Thank you for allowing pharmacy to be a part of this patient's care. ? ?Benita Gutter, RPH ?07/12/2021 5:53 PM ? ? ?   ?

## 2021-07-12 NOTE — ED Notes (Signed)
Pt checked and brief is dry at this time. ?

## 2021-07-12 NOTE — Consult Note (Signed)
NAME:  Daniel Bailey., MRN:  [id number], DOB:  [birth year], LOS: 0 ADMISSION DATE:  07/12/2021, CONSULTATION DATE:  07/12/2021 REFERRING MD:  Dr. Ouida Sills, CHIEF COMPLAINT:  Altered Mental Status   Brief Pt Description / Synopsis:  71 y.o. Male admitted with Acute Metabolic Encephalopathy in the setting of sepsis due to suspected Aspiration Pneumonia and Viral Gastroenteritis, along with AKI and Hyperkalemia.  History of Present Illness:  Daniel Bailey is a 71 year old male with a past medical history significant for baseline cognitive impairment, hypertension, diabetes mellitus type 2, hyperlipidemia, anemia, glaucoma, and obstructive sleep apnea who presented to [facility] ED on 07/12/2021 due to complaints of altered mental status and projectile vomiting.  Patient is currently altered, therefore history is obtained from the patient's cousin at bedside along with chart review.  Per family's report on Sunday he became confused, which progressively got worse on Monday, with associated poor p.o. intake, and inability to complete ADLs.  Earlier this morning he had an episode of projectile vomiting which prompted family to seek medical attention.  She also reports intermittent cough and congestion.  She denies any knowledge of chest pain, palpitations, fever or chills, abdominal pain, dysuria.  Of note his cousin reports he had an episode of confusion in January which was worked up by his PCP.  Found only to have anemia which he was referred to hematology/oncology at that time.  ED Course: Initial Vital Signs: Temperature 98.6 orally, respiratory rate 20, pulse 60, blood pressure 119/107, SPO2 94% on room air Significant Labs: Potassium 6.2, chloride 96, glucose 239, BUN 41, creatinine 2.83, lipase 53, AST 62, ALT 26, high-sensitivity troponin 11, lactic acid 3.2, WBC 17.1 with neutrophilia, hemoglobin 9.8, hematocrit 28.8, procalcitonin 2.35 COVID-19 PCR negative Urinalysis negative for UTI Imaging: Chest  x-ray>>FINDINGS: Retrocardiac infiltrate. No edema, effusion, or pneumothorax. Normal heart size and mediastinal contours. CT head without contrast>>IMPRESSION: Negative motion degraded head CT. Medications administered: Calcium gluconate, 10 units insulin and D50, albuterol nebulizer, Lokelma, azithromycin and Rocephin  Hospitalist were asked to admit.  Despite fluid resuscitation, systolic BP's remain in the 90's.  Given his high risk for deterioration, PCCM was consulted.  Pertinent  Medical History  Cognitive impairment (developmental delay) Hypertension Diabetes mellitus Hyperlipidemia Anemia Glaucoma Obstructive sleep apnea  Micro Data:  07/12/2021: SARS-CoV-2 and influenza PCR>> negative 07/12/2021: Blood culture x2>> 07/12/2021: Sputum>> 07/12/2021: Strep pneumo urinary antigen>>negative 07/12/2021: Legionella urinary antigen>>  Antimicrobials:  Azithromycin x1 dose 3/7 Ceftriaxone 3/7 x 1 dose Unasyn 3/7>>  Significant Hospital Events: Including procedures, antibiotic start and stop dates in addition to other pertinent events   07/12/2021: Presented to ED with altered mental status and poor p.o. intake.  Hospitalist admitting.  PCCM consulted due to soft blood pressure and high risk for decompensation.  CODE STATUS changed to DNR/DNI.  Interim History / Subjective:  -Patient presented to ED earlier this morning, admitted by the hospitalist -This afternoon blood pressures on the softer side with SBP 90's (however MAP remains greater than 65) -Patient with multiple metabolic derangements, high risk for decompensation ~ PCCM consulted -Updated patient's cousin Daniel Bailey (caregiver) at bedside ~decision made to make patient DNR/DNI, but will continue aggressive treatment measures in the interim and reassess response -While in the room patient noted to have periods of apnea while sleeping with O2 desaturations as low as 50% ~will need CPAP nightly and anytime while sleeping  Objective    Blood pressure (!) 91/51, pulse 87, temperature 98.6 F (37 C), temperature source Oral, resp. rate Marland Kitchen)  33, SpO2 95 %.        Intake/Output Summary (Last 24 hours) at 07/12/2021 1604 Last data filed at 07/12/2021 1056 Gross per 24 hour  Intake --  Output 1400 ml  Net -1400 ml   There were no vitals filed for this visit.  Examination: General: Acute on chronically ill-appearing male, sitting in bed, on nasal cannula, no acute distress HENT: Atraumatic, normocephalic, neck supple, no JVD Lungs: Diminished breath sounds bilaterally, even, nonlabored, normal effort Cardiovascular: Regular rate and rhythm, S1-S2, no murmurs, rubs, gallops Abdomen: Soft, nontender, nondistended, no guarding rebound tenderness Extremities: No deformities, no edema, no cyanosis or clubbing Neuro: Lethargic, arouses to voice, orientation difficult to assess due to altered mental status GU: Diaper in place, incontinent  Resolved Hospital Problem list     Assessment & Plan:   Sepsis due to suspected Aspiration Pneumonia and Viral Gastroenteritis Meets SIRS Criteria: RR 22, WBC 17.1, Lactic acid 3.2 -Monitor fever curve -Trend WBC's & Procalcitonin -Follow cultures as above -Continue empiric Unasyn pending cultures & sensitivities  Relative Hypotension (SBP 90's) PMHx: Hypertension -Continuous cardiac monitoring -Maintain MAP >65 -IV fluids ~ NS @ 150 cc/hr (received 3.5 L IVF resuscitation in ED) -Vasopressors as needed to maintain MAP goal -Continue Midodrine -Lactic acid has normalized (3.2 ~ 2.7 ~ 1.2) -HS Troponin negative x2 -Consider Echocardiogram   Acute Kidney Injury  Hyperkalemia ~ IMPROVED -Monitor I&O's / urinary output -Follow BMP -Ensure adequate renal perfusion -Avoid nephrotoxic agents as able -Replace electrolytes as indicated -IV Fluids -Received Calcium Gluconate, insulin + D50, and Lokelma ~ repeat BMP @ 1600 on 3/7 ~ K+ improved to 5.0 from 6.2  Acute Metabolic  Encephalopathy in setting of Sepsis and multiple metabolic derangements -Treat sepsis and metabolic derangements as above -Provide supportive care -Promote normal sleep/wake cycle, promote family presence -Avoid sedating meds as able -CT Head negative  Obstructive sleep apnea Aspiration pneumonia without hypoxia currently -Supplemental O2 as needed to maintain O2 sats greater than 92% -CPAP nightly and anytime while sleeping -Follow intermittent chest x-ray and ABG as needed   Best Practice (right click and "Reselect all SmartList Selections" daily)   Diet/type: NPO, until mental status improved DVT prophylaxis: prophylactic heparin  GI prophylaxis: N/A Lines: N/A Foley:  N/A Code Status:  DNR/DNI Last date of multidisciplinary goals of care discussion [N/A]  Updated pt's cousin Daniel Bailey at bedside.  All questions answered.  Labs   CBC: Recent Labs  Lab 07/05/21 1626 07/12/21 0635  WBC 13.7* 17.1*  NEUTROABS 12.6* 15.9*  HGB 10.5* 9.8*  HCT 31.7* 28.8*  MCV 91.6 90.9  PLT 249 294    Basic Metabolic Panel: Recent Labs  Lab 07/12/21 0635  NA 136  K 6.2*  CL 96*  CO2 25  GLUCOSE 239*  BUN 41*  CREATININE 2.83*  CALCIUM 9.6   GFR: Estimated Creatinine Clearance: 25.3 mL/min (A) (by C-G formula based on SCr of 2.83 mg/dL (H)). Recent Labs  Lab 07/05/21 1626 07/12/21 0635 07/12/21 0752 07/12/21 0819 07/12/21 0957 07/12/21 1500  PROCALCITON  --   --   --  2.35  --   --   WBC 13.7* 17.1*  --   --   --   --   LATICACIDVEN  --   --  3.2*  --  2.7* 1.2    Liver Function Tests: Recent Labs  Lab 07/12/21 0635  AST 62*  ALT 26  ALKPHOS 104  BILITOT 0.7  PROT 7.8  ALBUMIN 3.5  Recent Labs  Lab 07/12/21 0635  LIPASE 53*   Recent Labs  Lab 07/12/21 0635  AMMONIA 32    ABG No results found for: PHART, PCO2ART, PO2ART, HCO3, TCO2, ACIDBASEDEF, O2SAT   Coagulation Profile: Recent Labs  Lab 07/12/21 0635  INR 1.0    Cardiac Enzymes: No  results for input(s): CKTOTAL, CKMB, CKMBINDEX, TROPONINI in the last 168 hours.  HbA1C: Hgb A1c MFr Bld  Date/Time Value Ref Range Status  05/17/2021 03:38 PM 6.4 4.6 - 6.5 % Final    Comment:    Glycemic Control Guidelines for People with Diabetes:Non Diabetic:  <6%Goal of Therapy: <7%Additional Action Suggested:  >8%   02/09/2021 10:07 AM 6.4 4.6 - 6.5 % Final    Comment:    Glycemic Control Guidelines for People with Diabetes:Non Diabetic:  <6%Goal of Therapy: <7%Additional Action Suggested:  >8%     CBG: Recent Labs  Lab 07/12/21 1053  GLUCAP 145*    Review of Systems:   Unable to assess due to AMS   Past Medical History:  He,  has a past medical history of Anemia, Elevated PSA, Frequent urination, Glaucoma, History of COVID-19, Hyperglycemia, Hyperlipidemia, Hypertension, and Sleep apnea.   Surgical History:   Past Surgical History:  Procedure Laterality Date   COLONOSCOPY     PROSTATE BIOPSY N/A 11/20/2017   Procedure: BIOPSY TRANSRECTAL ULTRASONIC PROSTATE (TUBP);  Surgeon: Abbie Sons, MD;  Location: [facility] ORS;  Service: Urology;  Laterality: N/A;     Social History:   reports that he has never smoked. He has never used smokeless tobacco. He reports that he does not drink alcohol and does not use drugs.   Family History:  His family history includes Cancer in his father; Colon cancer in his mother; Kidney disease in his mother.   Allergies No Known Allergies   Home Medications  Prior to Admission medications   Medication Sig Start Date End Date Taking? Authorizing Provider  atorvastatin (LIPITOR) 10 MG tablet Take 1 tablet (10 mg total) by mouth daily. In the evening 08/11/20  Yes Tower, Wynelle Fanny, MD  gemfibrozil (LOPID) 600 MG tablet Take 1 tablet (600 mg total) by mouth 2 (two) times daily. 08/11/20  Yes Tower, Wynelle Fanny, MD  lisinopril (ZESTRIL) 5 MG tablet Take 0.5 tablets (2.5 mg total) by mouth daily. 08/11/20  Yes Tower, Wynelle Fanny, MD  metFORMIN  (GLUCOPHAGE-XR) 500 MG 24 hr tablet Take 2 tablets (1,000 mg total) by mouth daily with breakfast. 08/11/20  Yes Tower, Wynelle Fanny, MD  acetaminophen (TYLENOL) 500 MG tablet Take 1,000 mg by mouth daily as needed for headache.    [provider]     Critical care time: 55 minutes     Darel Hong, AGACNP-BC [facility] epic messenger for cross cover needs If after hours, please call E-link

## 2021-07-12 NOTE — H&P (Addendum)
History and Physical    Patient: Daniel Bailey. NFA:[id number] DOB: 04/17/1951 DOA: 07/12/2021 DOS: the patient was seen and examined on 07/12/2021 PCP: [name], MD  Patient coming from: Home  Chief Complaint:  Chief Complaint  Patient presents with   Altered Mental Status   HPI: Daniel Bailey. is a 71 y.o. male with medical history significant of baseline cognitive impairment, hTN, Type II DM, HLD, anemia, glaucoma, sleep apnea.  Patient provides history that he feels OK, has a cough. Denies pain, and unable to respond if he is experiencing dyspnea. Unable to provide further history due to acute metabolic encephalopathy compounded on cognitive impairment.   Further history provided by his cousin, Daniel Bailey on phone. Patient lives with her son.   She states that he is "Usually a talker". Seen by PCP in January after second transient episode of confusion which had overall normal workup in exception of anemia and was referred to heme/onc at that time. Saw them 2/28 for normocytic anemia.  Sunday 3/5- became confused like previous episodes.  Monday 3/6- confusion significantly worse. Needed assistance walking due to feeling unstable, not eating, confused, mumbling.  Tuesday 3/7- emesis. Last PO intake the night prior.  Arrived to ED via EMS 3/7.   Sister states that at baseline he: Bathes himself, talks, feeds himself, walks, does chores around the house. Does not drive or shop. Has baseline cognitive impairment.   He has been coughing intermittently and congested. Got cough syrup yesterday. Denies fever. No recent changes in bowel or bladder habits.   Code status- full code for now, according to his cousin who is his main caregiver but she would like to discuss with his other caregiver and get back to [place] if there are any changes. She has not discussed this with the patient previously.  In the ED: initial workup significant for: Hypotension- systolics in 46N-62X with minimal response to  3L NS plus adding maintenance fluids. Tachypnea up to 28 Chest CT positive for LLL PNA consistent with aspiration K+ 6.2- given d50 ampule + 10units insulin, albuterol nebulizer, lokelma, calcium gluconate given, repeat BMP ordered. Positive for peaked T waves on initial ECG Cr 2.83 from normal baseline WBC 17.1, hgb 9.8 COVID negative Lipase 53 LA 3.2>2.7, repeat ordered Troponin negative Started on CTX, flagyl, azithromycin Blood cultures pending  Review of Systems: As mentioned in the history of present illness. All other systems reviewed and are negative. Past Medical History:  Diagnosis Date   Anemia    Elevated PSA    Frequent urination    Glaucoma    History of COVID-19    Hyperglycemia    Hyperlipidemia    Hypertension    Sleep apnea    Past Surgical History:  Procedure Laterality Date   COLONOSCOPY     PROSTATE BIOPSY N/A 11/20/2017   Procedure: BIOPSY TRANSRECTAL ULTRASONIC PROSTATE (TUBP);  Surgeon: Abbie Sons, MD;  Location: [facility] ORS;  Service: Urology;  Laterality: N/A;   Social History:  reports that he has never smoked. He has never used smokeless tobacco. He reports that he does not drink alcohol and does not use drugs.  No Known Allergies  Family History  Problem Relation Age of Onset   Colon cancer Mother    Kidney disease Mother    Cancer Father        unknown    Prior to Admission medications   Medication Sig Start Date End Date Taking? Authorizing Provider  atorvastatin (LIPITOR) 10 MG  tablet Take 1 tablet (10 mg total) by mouth daily. In the evening 08/11/20  Yes Tower, Wynelle Fanny, MD  gemfibrozil (LOPID) 600 MG tablet Take 1 tablet (600 mg total) by mouth 2 (two) times daily. 08/11/20  Yes Tower, Wynelle Fanny, MD  lisinopril (ZESTRIL) 5 MG tablet Take 0.5 tablets (2.5 mg total) by mouth daily. 08/11/20  Yes Tower, Wynelle Fanny, MD  metFORMIN (GLUCOPHAGE-XR) 500 MG 24 hr tablet Take 2 tablets (1,000 mg total) by mouth daily with breakfast. 08/11/20  Yes Tower,  Wynelle Fanny, MD  acetaminophen (TYLENOL) 500 MG tablet Take 1,000 mg by mouth daily as needed for headache.    [provider]    Physical Exam: Vitals:   07/12/21 0935 07/12/21 0955 07/12/21 1245 07/12/21 1330  BP: (!) 84/62  (!) 97/53 (!) 92/52  Pulse:  95 64 86  Resp: 13 (!) 28 15 (!) 22  Temp:      TempSrc:      SpO2:  92% 92% 93%  Physical Exam Vitals and nursing note reviewed.  Constitutional:      Appearance: He is ill-appearing and diaphoretic.  HENT:     Head: Normocephalic.     Right Ear: External ear normal.     Left Ear: External ear normal.     Nose: Nose normal.     Mouth/Throat:     Mouth: Mucous membranes are dry.  Eyes:     Pupils: Pupils are equal, round, and reactive to light.  Cardiovascular:     Rate and Rhythm: Normal rate and regular rhythm.     Pulses: Normal pulses.     Heart sounds: No murmur heard. Pulmonary:     Effort: Pulmonary effort is normal. No respiratory distress.     Breath sounds: Rhonchi present. No wheezing.  Abdominal:     Palpations: Abdomen is soft.     Tenderness: There is no abdominal tenderness.  Musculoskeletal:        General: No swelling.     Cervical back: No tenderness.  Lymphadenopathy:     Cervical: No cervical adenopathy.  Skin:    General: Skin is warm.  Neurological:     Mental Status: He is alert. He is disoriented.     Motor: Weakness present.    Data Reviewed: Hypotension- systolics in 16X-09U with minimal response to 3L NS plus adding maintenance fluids. Tachypnea up to 28 Chest CT positive for LLL PNA consistent with aspiration K+ 6.2- given d50 ampule + 10units insulin, albuterol nebulizer, lokelma, calcium gluconate given, repeat BMP ordered. Positive for peaked T waves on initial ECG Cr 2.83 from normal baseline WBC 17.1, hgb 9.8 COVID negative Lipase 53 LA 3.2>2.7, repeat ordered Troponin negative Started on CTX, flagyl, azithromycin Blood cultures pending  Assessment and Plan: Mr Poss  is severely ill 71yo male who presents with multiple organ dysfunction syndrome. I suspect based on history from his cousin that he has had acute viral gastroenteritis with vomiting leading to aspiration now PNA. Despite initial fluid resuscitation, he remains hemodynamically critical with systolics in 04V and is high risk of deterioration. I have reached out to make CCM aware of his condition  Multiple organ dysfunction syndrome as evidenced by AKI and encephalopathy. Meets criteria with hypotension despite adequate fluid resuscitation, leukocytosis, lactic acidoses. Management by treating underlying conditions as listed below. - midodrine TID as well as continue fluid support - vitals q2 hours or sooner if change in status  Aspiration pneumonia without hypoxia- patient at high  risk for deterioration and unable to verbalize his needs well. Low threshold to transfer to ICU. Cousin states that he is full code and full scope care.  - continue IV Abx, will transition to unasyn - incentive spirometer - supportive care PRN  Metabolic encephalopathy- at baseline independent ADLs and conversational. Currently minimally responsive and acute physical deconditioning - PT/OT/SLP  AKI   hyperkalemia- K+ 6.2 on presentation with peaked T waves. S/p calcium gluconate, lokelma, albuterol, insulin 10 units - IV fluid hydration - strict I/O. Consult nephrology if urine output <500cc daily - low threshold to repeat calcium if not improving hemodynamics - BMP repeat 1600 - repeat ECG   Advance Care Planning:   Code Status: Full Code   Consults: none  Family Communication: cousin on phone  Severity of Illness: The appropriate patient status for this patient is INPATIENT. Inpatient status is judged to be reasonable and necessary in order to provide the required intensity of service to ensure the patient's safety. The patient's presenting symptoms, physical exam findings, and initial radiographic and  laboratory data in the context of their chronic comorbidities is felt to place them at high risk for further clinical deterioration. Furthermore, it is not anticipated that the patient will be medically stable for discharge from the hospital within 2 midnights of admission.   * I certify that at the point of admission it is my clinical judgment that the patient will require inpatient hospital care spanning beyond 2 midnights from the point of admission due to high intensity of service, high risk for further deterioration and high frequency of surveillance required.*  Author: Richarda Osmond, MD 07/12/2021 3:18 PM  For on call review [email or web address].

## 2021-07-13 ENCOUNTER — Encounter: Payer: Self-pay | Admitting: Student in an Organized Health Care Education/Training Program

## 2021-07-13 DIAGNOSIS — G9341 Metabolic encephalopathy: Secondary | ICD-10-CM

## 2021-07-13 DIAGNOSIS — J9601 Acute respiratory failure with hypoxia: Secondary | ICD-10-CM | POA: Diagnosis not present

## 2021-07-13 DIAGNOSIS — A419 Sepsis, unspecified organism: Secondary | ICD-10-CM | POA: Diagnosis not present

## 2021-07-13 DIAGNOSIS — E872 Acidosis, unspecified: Secondary | ICD-10-CM

## 2021-07-13 DIAGNOSIS — R6520 Severe sepsis without septic shock: Secondary | ICD-10-CM | POA: Diagnosis not present

## 2021-07-13 LAB — BASIC METABOLIC PANEL
Anion gap: 8 (ref 5–15)
BUN: 34 mg/dL — ABNORMAL HIGH (ref 8–23)
CO2: 22 mmol/L (ref 22–32)
Calcium: 8.8 mg/dL — ABNORMAL LOW (ref 8.9–10.3)
Chloride: 109 mmol/L (ref 98–111)
Creatinine, Ser: 1.82 mg/dL — ABNORMAL HIGH (ref 0.61–1.24)
GFR, Estimated: 39 mL/min — ABNORMAL LOW (ref >60)
Glucose, Bld: 106 mg/dL — ABNORMAL HIGH (ref 70–99)
Potassium: 4.8 mmol/L (ref 3.5–5.1)
Sodium: 139 mmol/L (ref 135–145)

## 2021-07-13 LAB — GLUCOSE, CAPILLARY: Glucose-Capillary: 118 mg/dL — ABNORMAL HIGH (ref 70–99)

## 2021-07-13 LAB — CBC
HCT: 23 % — ABNORMAL LOW (ref 39.0–52.0)
Hemoglobin: 7.5 g/dL — ABNORMAL LOW (ref 13.0–17.0)
MCH: 30.5 pg (ref 26.0–34.0)
MCHC: 32.6 g/dL (ref 30.0–36.0)
MCV: 93.5 fL (ref 80.0–100.0)
Platelets: 210 10*3/uL (ref 150–400)
RBC: 2.46 MIL/uL — ABNORMAL LOW (ref 4.22–5.81)
RDW: 17.9 % — ABNORMAL HIGH (ref 11.5–15.5)
WBC: 9.3 10*3/uL (ref 4.0–10.5)
nRBC: 0 % (ref 0.0–0.2)

## 2021-07-13 LAB — CBG MONITORING, ED: Glucose-Capillary: 97 mg/dL (ref 70–99)

## 2021-07-13 LAB — PROCALCITONIN: Procalcitonin: 1.29 ng/mL

## 2021-07-13 LAB — CORTISOL: Cortisol, Plasma: 23.3 ug/dL

## 2021-07-13 MED ORDER — SODIUM CHLORIDE 0.9 % IV BOLUS
500.0000 mL | Freq: Once | INTRAVENOUS | Status: AC
  Administered 2021-07-13: 500 mL via INTRAVENOUS

## 2021-07-13 MED ORDER — ALBUTEROL SULFATE (2.5 MG/3ML) 0.083% IN NEBU: 2.5000 mg | INHALATION_SOLUTION | RESPIRATORY_TRACT | Status: DC | PRN

## 2021-07-13 MED ORDER — HYDROCORTISONE SOD SUC (PF) 100 MG IJ SOLR
100.0000 mg | Freq: Three times a day (TID) | INTRAMUSCULAR | Status: DC
  Administered 2021-07-13 – 2021-07-14 (×2): 100 mg via INTRAVENOUS
  Filled 2021-07-13 (×3): qty 2

## 2021-07-13 NOTE — Assessment & Plan Note (Addendum)
On further review, had severe sepsis on admission due to presence of sepsis along with organ dysfunction including AKI, lactic acidosis and mental status changes. ?Due to aspiration pneumonia. ?Met sepsis criteria on admission. ?Treated with aggressive IV fluid boluses and maintenance IV fluids and broad-spectrum antibiotics. ?Had intermittent episodes of hypothermia and hypotension despite patient denying complaints, looking well and nontoxic.  ?No hypothermic episodes in the last 72 hours.  Occasional mild soft BPs with SBP's in the 90s but asymptomatic. ?Random cortisol 23.  Blood cultures x2 from admission negative to date. ?Legal guardian unaware of issues with hypotension or hypothermia. ?

## 2021-07-13 NOTE — Assessment & Plan Note (Addendum)
Not hypoxic. ?Transitioned from IV Unasyn to Augmentin and has completed approximately 7 days course.  No further antibiotics at discharge. ?Likely due to mental status changes. ?SLP evaluation appreciated.  Do not feel that he has oropharyngeal dysphagia.  His aspiration may be related to underlying esophageal pathology.  ?SLP follow-up appreciated and have recommended mechanical soft diet, thin liquids and PPI. ?Tolerating above diet.  Consider outpatient GI evaluation. ?Recommend repeating chest x-ray in 4 weeks to ensure resolution of pneumonia findings ?

## 2021-07-13 NOTE — Assessment & Plan Note (Addendum)
Presented with potassium of 6.2. ?S/p calcium gluconate, Lokelma, albuterol and insulin ?Treated and resolved. ?Potassium normal on day of discharge. ?

## 2021-07-13 NOTE — Progress Notes (Signed)
PROGRESS NOTE   Daniel Bailey.  EKC:[id number]    DOB: 08-24-1950    DOA: 07/12/2021  PCP: [name], MD   I have briefly reviewed patients previous medical records in [facility].  Chief Complaint  Patient presents with   Altered Mental Status    Hospital Course:  71 year old male with medical history significant for baseline cognitive impairment, HTN, type II DM, HLD, anemia, glaucoma, sleep apnea, presented to the ED due to altered mental status, vomiting and poor oral intake.  At baseline patient repeats himself, talks, feeds himself, walks, does chores around the house.  Does not drive or shop.  Intermittent cough and congestion.  In the ED, hypotensive with SBP's in the 80s-90s, minimally responsive to 3 L IV saline bolus and maintenance IV fluids, tachypneic up to 28, CT chest positive for left lower lobe pneumonia consistent with aspiration, hyperkalemia/6.2, creatinine 2.83, elevated lactate.  Admitted for aspiration pneumonia, AKI and acute metabolic encephalopathy.  CCM consulted.  Improved.   Assessment & Plan:  Principal Problem:   Sepsis ([place]) Active Problems:   Aspiration pneumonia (HCC)   AKI (acute kidney injury) ([place])   Hyperkalemia   Lactic acidosis   Acute metabolic encephalopathy   Normocytic anemia   Acute respiratory failure with hypoxia (HCC)   Assessment and Plan: * Sepsis ([place]) Due to aspiration pneumonia. Met sepsis criteria on admission. Treated with aggressive IV fluid boluses and maintenance IV fluids and broad-spectrum antibiotics. Ongoing intermittent hypotension but unclear if these readings are accurate. Will check random serum cortisol and add stress dose hydrocortisone.  Aspiration pneumonia ([place]) Not hypoxic. Continue IV Unasyn. Likely due to mental status changes. SLP evaluation appreciated.  Do not feel that he has oropharyngeal dysphagia.  His aspiration may be related to underlying esophageal pathology.  If he is unable to  tolerate p.o., could consider GI consultation, either outpatient or inpatient.  AKI (acute kidney injury) ([place]) Presented with creatinine of 2.21. Likely related to GI losses and poor oral intake. Creatinine has improved to 1.82. Continue gentle IV fluids and follow BMP in AM.  Hyperkalemia Presented with potassium of 6.2. S/p calcium gluconate, Lokelma, albuterol and insulin Treated and resolved.  Lactic acidosis Resolved.  Acute metabolic encephalopathy Multifactorial due to sepsis, AKI and pneumonia. Will need to check with family regarding his baseline and current status. Supportive care.  Acute respiratory failure with hypoxia ([place]) ?  Was on CPAP versus BiPAP briefly Was not on either when I saw him this morning. Improved.  Normocytic anemia Presented with hemoglobin of 9.8 which is dropped to 7.5.  No overt bleeding. Likely dilutional. Follow CBC in a.m. and transfuse if hemoglobin 7 g or less.    There is no height or weight on file to calculate BMI.  Nutritional Status        Pressure Ulcer:     DVT prophylaxis: heparin injection 5,000 Units Start: 07/12/21 1400     Code Status: DNR:  Family Communication: Discussed in detail with patient's cousin/healthcare power of attorney, [name] who wanted to discuss his CODE STATUS further and after careful consideration, continues to recommend DNR. Disposition:  Status is: Inpatient Remains inpatient appropriate because: Severity of illness    Consultants:   CCM  Procedures:     Antimicrobials:   As above   Subjective:  Seen this morning while still in ED.  Poor historian.  Alert and oriented x2.  Denies complaints.  Specifically denies cough, dyspnea or pain.  Objective:  Vitals:   07/13/21 1130 07/13/21 1525 07/13/21 1527 07/13/21 1707  BP:  (!) 86/55 (!) 150/113 (!) 81/52  Pulse:  (!) 59 62 (!) 57  Resp:  $Remo'16 16 16  'KULjh$ Temp:  98 F (36.7 C)    TempSrc:  Oral    SpO2: 94%  100% 96%    General  exam: Elderly male, moderately built and obese lying comfortably propped up in bed.  His clothes have been soiled/wet. Respiratory system: Slightly diminished breath sounds in the bases with occasional basal crackles but otherwise clear to auscultation.  No increased work of breathing. Cardiovascular system: S1 & S2 heard, RRR. No JVD, murmurs, rubs, gallops or clicks. No pedal edema. Gastrointestinal system: Abdomen is nondistended but obese, soft and nontender. No organomegaly or masses felt. Normal bowel sounds heard.  Uncomplicated umbilical hernia. Central nervous system: Alert and oriented x2. No focal neurological deficits. Extremities: Left upper extremity tremors intermittently.  Strength in bilateral upper extremity appears normal.  Lower extremity grade 2 x 5 power. Skin: No rashes, lesions or ulcers Psychiatry: Judgement and insight appear impaired.  Mood & affect flat.     Data Reviewed:   I have personally reviewed following labs and imaging studies   CBC: Recent Labs  Lab 07/12/21 0635 07/13/21 0420  WBC 17.1* 9.3  NEUTROABS 15.9*  --   HGB 9.8* 7.5*  HCT 28.8* 23.0*  MCV 90.9 93.5  PLT 284 536    Basic Metabolic Panel: Recent Labs  Lab 07/12/21 0635 07/12/21 1556 07/13/21 0420  NA 136 138 139  K 6.2* 5.0 4.8  CL 96* 107 109  CO2 25 21* 22  GLUCOSE 239* 160* 106*  BUN 41* 42* 34*  CREATININE 2.83* 2.21* 1.82*  CALCIUM 9.6 8.4* 8.8*    Liver Function Tests: Recent Labs  Lab 07/12/21 0635  AST 62*  ALT 26  ALKPHOS 104  BILITOT 0.7  PROT 7.8  ALBUMIN 3.5    CBG: Recent Labs  Lab 07/12/21 1716 07/13/21 0735 07/13/21 1152  GLUCAP 150* 97 118*    Microbiology Studies:   Recent Results (from the past 240 hour(s))  Resp Panel by RT-PCR (Flu A&B, Covid) Nasopharyngeal Swab     Status: None   Collection Time: 07/12/21  6:35 AM   Specimen: Nasopharyngeal Swab; Nasopharyngeal(NP) swabs in vial transport medium  Result Value Ref Range Status    SARS Coronavirus 2 by RT PCR NEGATIVE NEGATIVE Final    Comment: (NOTE) SARS-CoV-2 target nucleic acids are NOT DETECTED.  The SARS-CoV-2 RNA is generally detectable in upper respiratory specimens during the acute phase of infection. The lowest concentration of SARS-CoV-2 viral copies this assay can detect is 138 copies/mL. A negative result does not preclude SARS-Cov-2 infection and should not be used as the sole basis for treatment or other patient management decisions. A negative result may occur with  improper specimen collection/handling, submission of specimen other than nasopharyngeal swab, presence of viral mutation(s) within the areas targeted by this assay, and inadequate number of viral copies(<138 copies/mL). A negative result must be combined with clinical observations, patient history, and epidemiological information. The expected result is Negative.  Fact Sheet for Patients:  EntrepreneurPulse.com.au  Fact Sheet for Healthcare Providers:  IncredibleEmployment.be  This test is no t yet approved or cleared by the [place] FDA and  has been authorized for detection and/or diagnosis of SARS-CoV-2 by FDA under an Emergency Use Authorization (EUA). This EUA will remain  in effect (meaning this test can  be used) for the duration of the COVID-19 declaration under Section 564(b)(1) of the Act, 21 U.S.C.section 360bbb-3(b)(1), unless the authorization is terminated  or revoked sooner.       Influenza A by PCR NEGATIVE NEGATIVE Final   Influenza B by PCR NEGATIVE NEGATIVE Final    Comment: (NOTE) The Xpert Xpress SARS-CoV-2/FLU/RSV plus assay is intended as an aid in the diagnosis of influenza from Nasopharyngeal swab specimens and should not be used as a sole basis for treatment. Nasal washings and aspirates are unacceptable for Xpert Xpress SARS-CoV-2/FLU/RSV testing.  Fact Sheet for  Patients: EntrepreneurPulse.com.au  Fact Sheet for Healthcare Providers: IncredibleEmployment.be  This test is not yet approved or cleared by the [place] FDA and has been authorized for detection and/or diagnosis of SARS-CoV-2 by FDA under an Emergency Use Authorization (EUA). This EUA will remain in effect (meaning this test can be used) for the duration of the COVID-19 declaration under Section 564(b)(1) of the Act, 21 U.S.C. section 360bbb-3(b)(1), unless the authorization is terminated or revoked.  Performed at [facility], [address] [place], [place] [address]   Blood culture (routine x 2)     Status: None (Preliminary result)   Collection Time: 07/12/21  7:52 AM   Specimen: BLOOD  Result Value Ref Range Status   Specimen Description BLOOD LEFT ANTECUBITAL  Final   Special Requests   Final    BOTTLES DRAWN AEROBIC AND ANAEROBIC Blood Culture adequate volume   Culture   Final    NO GROWTH < 24 HOURS Performed at [facility], [address], [place], [place] [address]    Report Status PENDING  Incomplete  Blood culture (routine x 2)     Status: None (Preliminary result)   Collection Time: 07/12/21  7:52 AM   Specimen: BLOOD  Result Value Ref Range Status   Specimen Description BLOOD BLOOD LEFT ARM  Final   Special Requests   Final    BOTTLES DRAWN AEROBIC AND ANAEROBIC Blood Culture adequate volume   Culture   Final    NO GROWTH < 24 HOURS Performed at [facility], [address], [place], [place] [address]    Report Status PENDING  Incomplete    Radiology Studies:  CT HEAD WO CONTRAST (5MM)  Result Date: 07/12/2021 CLINICAL DATA:  Projectile vomiting. Mental status change with unknown cause. EXAM: CT HEAD WITHOUT CONTRAST TECHNIQUE: Contiguous axial images were obtained from the base of the skull through the vertex without intravenous contrast. RADIATION DOSE REDUCTION: This exam was  performed according to the departmental dose-optimization program which includes automated exposure control, adjustment of the mA and/or kV according to patient size and/or use of iterative reconstruction technique. COMPARISON:  06/06/2021 FINDINGS: Brain: No evidence of acute infarction, hemorrhage, hydrocephalus, extra-axial collection or mass lesion/mass effect. Vascular: No hyperdense vessel or unexpected calcification. Skull: Chronic right parietal bone thinning. Sinuses/Orbits: No acute finding. Other: Intermittent motion artifact. IMPRESSION: Negative motion degraded head CT. Electronically Signed   By: Jorje Guild M.D.   On: 07/12/2021 07:44   DG Chest Portable 1 View  Result Date: 07/12/2021 CLINICAL DATA:  Chest pain EXAM: PORTABLE CHEST 1 VIEW COMPARISON:  05/31/2021 FINDINGS: Retrocardiac infiltrate. No edema, effusion, or pneumothorax. Normal heart size and mediastinal contours. IMPRESSION: Retrocardiac infiltrate. Electronically Signed   By: Jorje Guild M.D.   On: 07/12/2021 06:59   CT CHEST ABDOMEN PELVIS WO CONTRAST  Result Date: 07/12/2021 CLINICAL DATA:  Respiratory illness with nondiagnostic x-ray EXAM:  CT CHEST, ABDOMEN AND PELVIS WITHOUT CONTRAST TECHNIQUE: Multidetector CT imaging of the chest, abdomen and pelvis was performed following the standard protocol without IV contrast. RADIATION DOSE REDUCTION: This exam was performed according to the departmental dose-optimization program which includes automated exposure control, adjustment of the mA and/or kV according to patient size and/or use of iterative reconstruction technique. COMPARISON:  Chest x-ray earlier today FINDINGS: CT CHEST FINDINGS Cardiovascular: Normal heart size. Trace pericardial fluid and/or thickening. No acute vascular finding Mediastinum/Nodes: Diffuse fluid distended esophagus. Negative for adenopathy or mass. Lungs/Pleura: Left lower lobe infiltrate and associated airway opacification. Musculoskeletal:  Motion artifact and diffuse thoracic degeneration. No acute finding. CT ABDOMEN PELVIS FINDINGS Hepatobiliary: Cystic densities in the central and left lobe liver.Cholelithiasis without signs of acute cholecystitis. Pancreas: Generalized atrophy Spleen: Unremarkable. Adrenals/Urinary Tract: Negative adrenals. No hydronephrosis or stone. Moderate distension of the bladder without focal abnormality. Stomach/Bowel: Fluid and gas distended stomach with reflux into the lower esophagus. Small bowel containing right groin hernia without obstructive changes. No bowel wall thickening. Vascular/Lymphatic: No acute vascular abnormality. Atheromatous calcification of the aorta. No mass or adenopathy. Reproductive:No pathologic findings. Other: No ascites or pneumoperitoneum. Right groin hernia as noted above. Fatty umbilical hernia. Musculoskeletal: Generalized spinal degeneration with dextroscoliosis. No acute finding. IMPRESSION: 1. Left lower lobe pneumonia, suspect underlying aspiration given fluid distended esophagus and left lower lobe airway opacification. 2. Gas and fluid distended stomach. 3. Fatty umbilical and small bowel containing right groin hernias. No small bowel obstruction. 4. Cholelithiasis. 5. Motion artifact. Electronically Signed   By: Jorje Guild M.D.   On: 07/12/2021 08:04    Scheduled Meds:    atorvastatin  10 mg Oral QHS   gemfibrozil  600 mg Oral BID   heparin  5,000 Units Subcutaneous Q8H   midodrine  10 mg Oral TID WC   polyethylene glycol  17 g Oral Daily    Continuous Infusions:    ampicillin-sulbactam (UNASYN) IV 3 g (07/13/21 1343)     LOS: 1 day     Vernell Leep, MD,  FACP, [facility], [facility], [facility] (Care Management Physician Certified) [place]  To contact the attending provider between 7A-7P or the covering provider during after hours 7P-7A, please log into the web site [email or web address] and access using universal [place] password  for that web site. If you do not have the password, please call the hospital operator.  07/13/2021, 6:23 PM

## 2021-07-13 NOTE — Assessment & Plan Note (Addendum)
Presented with creatinine of 2.21. ?Likely related to GI losses, poor oral intake and lisinopril. ?Resolved.  Creatinine down to 1.11.  Replaced hypokalemia. ?Now appears volume overloaded due to substantial IV fluids during this hospitalization.  Upper extremity edema slowly improving. ?Discontinued lisinopril especially given episodic soft blood pressures and on midodrine. ?Reassess during close outpatient follow-up. ?

## 2021-07-13 NOTE — Assessment & Plan Note (Addendum)
Presented with hemoglobin of 9.8 which has gradually dropped to a low of 7.1 in the absence of overt bleeding. ?May be multifactorial related to dilutional, acute illness.  ?Anemia panel unremarkable thus far and suggestive of chronic disease. ?Patient had a normal-appearing stool on 3/11 but unfortunately FOBT testing was never sent. ?Hemoglobin dropped to 6 g per DL on 3/11 in the absence of overt bleeding.  S/p 2 units PRBCs and hemoglobin up to 9.2. ?Has seen hematology on 07/05/2021.  Immunofixation without evidence of monoclonal protein.  Elevated light chains, unclear significance.  LDH normal.  Copper level elevated mildly.  No significant immunophenotypic abnormality detected on flow cytometry panel. ?Outpatient follow-up with hematology ??  EGD and colonoscopy as outpatient.  Last colonoscopy in 2013 and chart was normal. ?Hemoglobin improved to 10.1.  Although no melena, FOBT positive. ?Recommend close follow-up of CBC as outpatient by PCP/hematology. ?Recommend GI consultation for consideration for EGD and colonoscopy as outpatient. ?

## 2021-07-13 NOTE — Assessment & Plan Note (Signed)
Resolved

## 2021-07-13 NOTE — Assessment & Plan Note (Addendum)
Acute respiratory failure was present on admission.  Needed brief CPAP followed by BiPAP. ?PCCM input appreciated.  Indicated that patient has OSA.  Placed on CPAP nightly. ?Overnight 3/9, patient had a couple of apneic spells while on CPAP and was transitioned to BiPAP by RT. ?As per guardian, did not know of OSA diagnosis and did not use CPAP prior to admission. ?Acute respiratory failure resolved ?Pulmonology evaluating for OSA, possible need for CPAP at discharge, check nocturnal oxygen saturations overnight prior to discharge was not hypoxic and was saturating in the high 90s up to 100% on room air. ? ?

## 2021-07-13 NOTE — Hospital Course (Addendum)
71 year old male with medical history significant for baseline cognitive impairment, HTN, type II DM, HLD, anemia, glaucoma, sleep apnea, presented to the ED due to altered mental status, vomiting and poor oral intake.  At baseline patient repeats himself, talks, feeds himself, walks, does chores around the house.  Does not drive or shop.  Intermittent cough and congestion.  In the ED, hypotensive with SBP's in the 80s-90s, minimally responsive to 3 L IV saline bolus and maintenance IV fluids, tachypneic up to 28, CT chest positive for left lower lobe pneumonia consistent with aspiration, hyperkalemia/6.2, creatinine 2.83, elevated lactate.  Admitted for aspiration pneumonia, AKI and acute metabolic encephalopathy, all improved.  Unclear etiology regarding periodic episodes of hypotension and hypothermia.  Hemoglobin dropped to 6 g per DL in the absence of overt bleeding, s/p 2 units PRBC transfusion, hemoglobin up to 9 >10.2.  Follows with outpatient hematology for anemia evaluation.  Overnight oxygen saturations close to 100% without any desaturations documented.  Pulmonology recommends no CPAP at discharge and will follow-up in the office to arrange further evaluation including formal PSG. ?

## 2021-07-13 NOTE — Assessment & Plan Note (Addendum)
Multifactorial due to sepsis, AKI and pneumonia. ?Will need to check with family regarding his baseline and current status. ?Supportive care. ?Mental status now back to baseline. ?

## 2021-07-13 NOTE — ED Notes (Signed)
Pt resting in NAD. Denies any needs. Door open and pt visualized from nurses station.  ?

## 2021-07-14 DIAGNOSIS — J9601 Acute respiratory failure with hypoxia: Secondary | ICD-10-CM | POA: Diagnosis not present

## 2021-07-14 DIAGNOSIS — G9341 Metabolic encephalopathy: Secondary | ICD-10-CM | POA: Diagnosis not present

## 2021-07-14 DIAGNOSIS — N179 Acute kidney failure, unspecified: Secondary | ICD-10-CM | POA: Diagnosis not present

## 2021-07-14 DIAGNOSIS — A419 Sepsis, unspecified organism: Secondary | ICD-10-CM | POA: Diagnosis not present

## 2021-07-14 LAB — BASIC METABOLIC PANEL
Anion gap: 7 (ref 5–15)
BUN: 24 mg/dL — ABNORMAL HIGH (ref 8–23)
CO2: 23 mmol/L (ref 22–32)
Calcium: 9 mg/dL (ref 8.9–10.3)
Chloride: 108 mmol/L (ref 98–111)
Creatinine, Ser: 1.52 mg/dL — ABNORMAL HIGH (ref 0.61–1.24)
GFR, Estimated: 49 mL/min — ABNORMAL LOW (ref >60)
Glucose, Bld: 153 mg/dL — ABNORMAL HIGH (ref 70–99)
Potassium: 5 mmol/L (ref 3.5–5.1)
Sodium: 138 mmol/L (ref 135–145)

## 2021-07-14 LAB — CBC
HCT: 22.9 % — ABNORMAL LOW (ref 39.0–52.0)
Hemoglobin: 7.4 g/dL — ABNORMAL LOW (ref 13.0–17.0)
MCH: 30.7 pg (ref 26.0–34.0)
MCHC: 32.3 g/dL (ref 30.0–36.0)
MCV: 95 fL (ref 80.0–100.0)
Platelets: 177 10*3/uL (ref 150–400)
RBC: 2.41 MIL/uL — ABNORMAL LOW (ref 4.22–5.81)
RDW: 17.9 % — ABNORMAL HIGH (ref 11.5–15.5)
WBC: 5.6 10*3/uL (ref 4.0–10.5)
nRBC: 0 % (ref 0.0–0.2)

## 2021-07-14 LAB — IRON AND TIBC
Iron: 120 ug/dL (ref 45–182)
Saturation Ratios: 35 % (ref 17.9–39.5)
TIBC: 340 ug/dL (ref 250–450)
UIBC: 220 ug/dL

## 2021-07-14 LAB — FERRITIN: Ferritin: 207 ng/mL (ref 24–336)

## 2021-07-14 LAB — LEGIONELLA PNEUMOPHILA SEROGP 1 UR AG: L. pneumophila Serogp 1 Ur Ag: NEGATIVE

## 2021-07-14 LAB — GLUCOSE, CAPILLARY
Glucose-Capillary: 141 mg/dL — ABNORMAL HIGH (ref 70–99)
Glucose-Capillary: 202 mg/dL — ABNORMAL HIGH (ref 70–99)

## 2021-07-14 LAB — PROCALCITONIN: Procalcitonin: 0.52 ng/mL

## 2021-07-14 LAB — RETICULOCYTES
Immature Retic Fract: 26 % — ABNORMAL HIGH (ref 2.3–15.9)
RBC.: 2.43 MIL/uL — ABNORMAL LOW (ref 4.22–5.81)
Retic Count, Absolute: 46.9 10*3/uL (ref 19.0–186.0)
Retic Ct Pct: 1.9 % (ref 0.4–3.1)

## 2021-07-14 LAB — FOLATE: Folate: 7.5 ng/mL (ref >5.9)

## 2021-07-14 LAB — VITAMIN B12: Vitamin B-12: 406 pg/mL (ref 180–914)

## 2021-07-14 NOTE — Evaluation (Signed)
Physical Therapy Evaluation Patient Details Name: Daniel Bailey. MRN: [id number] DOB: [birth year] Today's Date: 07/14/2021  History of Present Illness  Patient is a 71 year old male with a PMH (+) for significant for baseline cognitive impairment, HTN, type II DM, HLD, anemia, glaucoma, and sleep apnea. Admitted for sepsis.   Clinical Impression  Physical Therapy Evaluation completed on this date. Patient tolerated session well and was agreeable to treatment. No pain reported throughout session. Patient has a baseline of cognitive deficits, however was oriented to self, and DOB (month and date, not year) Per phone conversation with caregiver (his cousin [name]) patient lives in a double wide with ~4 steps to enter and BHRs (ramp is present as well, however needs to be fixed). Patient at baseline was very active, Mod I/Independent with all ADLs and mobility. No AD at baseline, did chores around the house, baths himself, fixes himself cereal, takes the trash out, and does puzzles.   Patient demonstrated generalized weakness BUE and BLEs (3to3+/5). Was able to complete bed mobility at supervision with increased time and effort with [facility] elevated, and increased use of bed rails to complete transfer. Once sitting EOB, patient demonstrated good balance when attempting to put on socks. Author placed socks on for sake of time. Patient completed sit to stand transfer at [facility] by pulling up to standing from sink/counter across from EOB. In standing patient initially demonstrated poor balance with no AD and UUE on furniture. Once RW was given, patient demonstrated increased balance and stability. He was able to ambulate around the nurses station at [facility] with RW, and required cueing for ambulating within a safe distance from the RW. Patient was left in bed with all needs met. Patient would benefit from continued skilled physical therapy in order to optimize patient's return to PLOF. Recommend HHPT upon discharge from  acute hospitalization.     Recommendations for follow up therapy are one component of a multi-disciplinary discharge planning process, led by the attending physician.  Recommendations may be updated based on patient status, additional functional criteria and insurance authorization.  Follow Up Recommendations Home health PT    Assistance Recommended at Discharge Intermittent Supervision/Assistance  Patient can return home with the following  Direct supervision/assist for medications management;Direct supervision/assist for financial management;A little help with walking and/or transfers;Assist for transportation    Equipment Recommendations Rolling walker (2 wheels)  Recommendations for Other Services       Functional Status Assessment Patient has had a recent decline in their functional status and demonstrates the ability to make significant improvements in function in a reasonable and predictable amount of time.     Precautions / Restrictions Precautions Precautions: Fall Restrictions Weight Bearing Restrictions: No      Mobility  Bed Mobility Overal bed mobility: Needs Assistance Bed Mobility: Supine to Sit, Sit to Supine     Supine to sit: Supervision, HOB elevated Sit to supine: Supervision, HOB elevated   General bed mobility comments: increased time and effort to complete, increased use of bed rails, however no physical assistance from author given    Transfers Overall transfer level: Needs assistance Equipment used: None Transfers: Sit to/from Stand Sit to Stand: Min guard           General transfer comment: patient reached forward to pull up to standing from sink/counter across from bed    Ambulation/Gait Ambulation/Gait assistance: Min guard (SBA) Gait Distance (Feet): 180 Feet Assistive device: Rolling walker (2 wheels) Gait Pattern/deviations: Step-through pattern, Decreased step length -  right, Decreased step length - left, Narrow base of  support Gait velocity: decreased     General Gait Details: cueing for ambulating safe distance within RW, directional cueing back to room  Stairs            Wheelchair Mobility    Modified Rankin (Stroke Patients Only)       Balance Overall balance assessment: Needs assistance Sitting-balance support: Bilateral upper extremity supported, Feet supported Sitting balance-Leahy Scale: Good Sitting balance - Comments: good sitting balance when attempted to put on socks- author placed socks on for time sake   Standing balance support: Single extremity supported, Bilateral upper extremity supported, No upper extremity supported, During functional activity Standing balance-Leahy Scale: Fair Standing balance comment: initial steps with either no UE support/ AD or UUE support from furniture with poor standing balance and moderate unsteadiness, once patient given RW for stability, stability improved to fair                             Pertinent Vitals/Pain Pain Assessment Pain Assessment: No/denies pain    Home Living Family/patient expects to be discharged to:: Private residence Living Arrangements: Other relatives (cousin) Available Help at Discharge: Family Type of Home: Mobile home Home Access: Ramped entrance       Home Layout: One level Home Equipment: None      Prior Function Prior Level of Function : Independent/Modified Independent                     Hand Dominance   Dominant Hand: Left    Extremity/Trunk Assessment   Upper Extremity Assessment Upper Extremity Assessment: Generalized weakness (at least 3/5 bilaterally)    Lower Extremity Assessment Lower Extremity Assessment: Generalized weakness (at least 3+/5 bilaterally)       Communication   Communication: No difficulties  Cognition Arousal/Alertness: Awake/alert Behavior During Therapy: WFL for tasks assessed/performed Overall Cognitive Status: History of cognitive  impairments - at baseline                                 General Comments: Oriented to self and DOB (not year of birth) disoriented to location, situation, month        General Comments General comments (skin integrity, edema, etc.): SpO2 remained >90% on room air throughout session, HR ranged from 66-83bpm    Exercises Other Exercises Other Exercises: patient educated on role of PT in acute setting, safe DME use, fall risk, and d/c plans   Assessment/Plan    PT Assessment Patient needs continued PT services  PT Problem List Decreased strength;Decreased balance;Decreased activity tolerance;Decreased cognition;Decreased knowledge of use of DME;Decreased mobility;Decreased safety awareness       PT Treatment Interventions DME instruction;Functional mobility training;Balance training;Patient/family education;Therapeutic activities;Gait training;Therapeutic exercise;Stair training    PT Goals (Current goals can be found in the Care Plan section)  Acute Rehab PT Goals Patient Stated Goal: to go home PT Goal Formulation: With patient Time For Goal Achievement: 07/28/21 Potential to Achieve Goals: Good    Frequency Min 2X/week     Co-evaluation               AM-PAC PT "6 Clicks" Mobility  Outcome Measure Help needed turning from your back to your side while in a flat bed without using bedrails?: A Little Help needed moving from lying on your back to sitting on  the side of a flat bed without using bedrails?: A Little Help needed moving to and from a bed to a chair (including a wheelchair)?: A Little Help needed standing up from a chair using your arms (e.g., wheelchair or bedside chair)?: A Little Help needed to walk in hospital room?: A Little Help needed climbing 3-5 steps with a railing? : A Little 6 Click Score: 18    End of Session Equipment Utilized During Treatment: Gait belt Activity Tolerance: Patient tolerated treatment well Patient left: in  bed;with call bell/phone within reach;with bed alarm set Nurse Communication: Mobility status PT Visit Diagnosis: Unsteadiness on feet (R26.81);Muscle weakness (generalized) (M62.81);Difficulty in walking, not elsewhere classified (R26.2)    Time: 7989-2119 PT Time Calculation (min) (ACUTE ONLY): 27 min   Charges:   PT Evaluation $PT Eval Low Complexity: 1 Low PT Treatments $Gait Training: 8-22 mins        Iva Boop, PT  07/14/21. 12:31 PM

## 2021-07-14 NOTE — Progress Notes (Signed)
PROGRESS NOTE   Daniel Bailey.  TKW:[id number]    DOB: [birth year]    DOA: 07/12/2021  PCP: [name], MD   I have briefly reviewed patients previous medical records in [facility]  Chief Complaint  Patient presents with   Altered Mental Status    Hospital Course:  71 year old male with medical history significant for baseline cognitive impairment, HTN, type II DM, HLD, anemia, glaucoma, sleep apnea, presented to the ED due to altered mental status, vomiting and poor oral intake.  At baseline patient repeats himself, talks, feeds himself, walks, does chores around the house.  Does not drive or shop.  Intermittent cough and congestion.  In the ED, hypotensive with SBP's in the 80s-90s, minimally responsive to 3 L IV saline bolus and maintenance IV fluids, tachypneic up to 28, CT chest positive for left lower lobe pneumonia consistent with aspiration, hyperkalemia/6.2, creatinine 2.83, elevated lactate.  Admitted for aspiration pneumonia, AKI and acute metabolic encephalopathy.  CCM consulted.  Improved.   Assessment & Plan:  Principal Problem:   Sepsis ([place]) Active Problems:   Aspiration pneumonia (HCC)   AKI (acute kidney injury) ([place])   Hyperkalemia   Lactic acidosis   Acute metabolic encephalopathy   Normocytic anemia   Acute respiratory failure with hypoxia (HCC)   Assessment and Plan: * Sepsis ([place]) Due to aspiration pneumonia. Met sepsis criteria on admission. Treated with aggressive IV fluid boluses and maintenance IV fluids and broad-spectrum antibiotics. Ongoing intermittent hypotension, rebolused with IV fluids, empirically started on stress dose IV hydrocortisone while checking random cortisol Blood pressures have improved today.  Random cortisol 23.  Discontinued IV hydrocortisone. Sepsis physiology seems to have resolved.  Monitor.  Aspiration pneumonia ([place]) Not hypoxic. Continue IV Unasyn. Likely due to mental status changes. SLP evaluation appreciated.   Do not feel that he has oropharyngeal dysphagia.  His aspiration may be related to underlying esophageal pathology.  If he is unable to tolerate p.o., could consider GI consultation, either outpatient or inpatient.  AKI (acute kidney injury) ([place]) Presented with creatinine of 2.21. Likely related to GI losses and poor oral intake. Creatinine has improved to 1.52 Now appears volume overloaded due to substantial IV fluids during this hospitalization.  Upper extremity edema noted.  Discontinued IV fluids.  Hyperkalemia Presented with potassium of 6.2. S/p calcium gluconate, Lokelma, albuterol and insulin Treated and resolved.  Lactic acidosis Resolved.  Acute metabolic encephalopathy Multifactorial due to sepsis, AKI and pneumonia. Will need to check with family regarding his baseline and current status. Supportive care. His current mental status may be his baseline.  Acute respiratory failure with hypoxia ([place]) ?  Was on CPAP versus BiPAP briefly Improved.  Normocytic anemia Presented with hemoglobin of 9.8 which is dropped to 7.5.  No overt bleeding. Likely dilutional. Follow CBC in a.m. and transfuse if hemoglobin 7 g or less. Hemoglobin stable in the mid 7 g range for the last 2 days. Anemia panel unremarkable thus far and suggestive of chronic disease. Check FOBT.    There is no height or weight on file to calculate BMI.  Nutritional Status        Pressure Ulcer:     DVT prophylaxis: heparin injection 5,000 Units Start: 07/12/21 1400     Code Status: DNR:  Family Communication: None at bedside. Disposition:  Status is: Inpatient Remains inpatient appropriate because: Severity of illness    Consultants:   CCM  Procedures:     Antimicrobials:   As above  Subjective:  Denies complaints.  Sitting up and eating breakfast by himself.  Specifically denies dyspnea or pain.  Objective:   Vitals:   07/14/21 0045 07/14/21 0500 07/14/21 0600 07/14/21  0826  BP: (!) 88/73 104/65  100/61  Pulse: (!) 56 (!) 56  (!) 51  Resp:    18  Temp:  (!) 97 F (36.1 C) (!) 96.5 F (35.8 C) (!) 97.4 F (36.3 C)  TempSrc:  Oral Axillary   SpO2:  95%  100%    General exam: Elderly male, moderately built and obese lying comfortably propped up in bed.  Looks much improved compared to yesterday.  More communicative. Respiratory system: Occasional basal crackles but otherwise clear to auscultation.  No increased work of breathing. Cardiovascular system: S1 & S2 heard, RRR. No JVD, murmurs, rubs, gallops or clicks. No pedal edema.  Has bilateral upper extremity trace edema.   Gastrointestinal system: Abdomen is nondistended but obese, soft and nontender. No organomegaly or masses felt. Normal bowel sounds heard.  Uncomplicated umbilical hernia. Central nervous system: Alert and oriented x2. No focal neurological deficits. Extremities: Left upper extremity tremors intermittently.  Strength in bilateral upper extremity appears normal.  Lower extremity grade 2 x 5 power. Skin: No rashes, lesions or ulcers Psychiatry: Judgement and insight appear impaired.  Mood & affect flat.     Data Reviewed:   I have personally reviewed following labs and imaging studies   CBC: Recent Labs  Lab 07/12/21 0635 07/13/21 0420 07/14/21 0555  WBC 17.1* 9.3 5.6  NEUTROABS 15.9*  --   --   HGB 9.8* 7.5* 7.4*  HCT 28.8* 23.0* 22.9*  MCV 90.9 93.5 95.0  PLT 284 210 932    Basic Metabolic Panel: Recent Labs  Lab 07/12/21 0635 07/12/21 1556 07/13/21 0420 07/14/21 0555  NA 136 138 139 138  K 6.2* 5.0 4.8 5.0  CL 96* 107 109 108  CO2 25 21* 22 23  GLUCOSE 239* 160* 106* 153*  BUN 41* 42* 34* 24*  CREATININE 2.83* 2.21* 1.82* 1.52*  CALCIUM 9.6 8.4* 8.8* 9.0    Liver Function Tests: Recent Labs  Lab 07/12/21 0635  AST 62*  ALT 26  ALKPHOS 104  BILITOT 0.7  PROT 7.8  ALBUMIN 3.5    CBG: Recent Labs  Lab 07/13/21 1152 07/14/21 0830 07/14/21 1135   GLUCAP 118* 141* 202*    Microbiology Studies:   Recent Results (from the past 240 hour(s))  Resp Panel by RT-PCR (Flu A&B, Covid) Nasopharyngeal Swab     Status: None   Collection Time: 07/12/21  6:35 AM   Specimen: Nasopharyngeal Swab; Nasopharyngeal(NP) swabs in vial transport medium  Result Value Ref Range Status   SARS Coronavirus 2 by RT PCR NEGATIVE NEGATIVE Final    Comment: (NOTE) SARS-CoV-2 target nucleic acids are NOT DETECTED.  The SARS-CoV-2 RNA is generally detectable in upper respiratory specimens during the acute phase of infection. The lowest concentration of SARS-CoV-2 viral copies this assay can detect is 138 copies/mL. A negative result does not preclude SARS-Cov-2 infection and should not be used as the sole basis for treatment or other patient management decisions. A negative result may occur with  improper specimen collection/handling, submission of specimen other than nasopharyngeal swab, presence of viral mutation(s) within the areas targeted by this assay, and inadequate number of viral copies(<138 copies/mL). A negative result must be combined with clinical observations, patient history, and epidemiological information. The expected result is Negative.  Fact Sheet for Patients:  EntrepreneurPulse.com.au  Fact Sheet for Healthcare Providers:  IncredibleEmployment.be  This test is no t yet approved or cleared by the [place] FDA and  has been authorized for detection and/or diagnosis of SARS-CoV-2 by FDA under an Emergency Use Authorization (EUA). This EUA will remain  in effect (meaning this test can be used) for the duration of the COVID-19 declaration under Section 564(b)(1) of the Act, 21 U.S.C.section 360bbb-3(b)(1), unless the authorization is terminated  or revoked sooner.       Influenza A by PCR NEGATIVE NEGATIVE Final   Influenza B by PCR NEGATIVE NEGATIVE Final    Comment: (NOTE) The Xpert  Xpress SARS-CoV-2/FLU/RSV plus assay is intended as an aid in the diagnosis of influenza from Nasopharyngeal swab specimens and should not be used as a sole basis for treatment. Nasal washings and aspirates are unacceptable for Xpert Xpress SARS-CoV-2/FLU/RSV testing.  Fact Sheet for Patients: EntrepreneurPulse.com.au  Fact Sheet for Healthcare Providers: IncredibleEmployment.be  This test is not yet approved or cleared by the [place] FDA and has been authorized for detection and/or diagnosis of SARS-CoV-2 by FDA under an Emergency Use Authorization (EUA). This EUA will remain in effect (meaning this test can be used) for the duration of the COVID-19 declaration under Section 564(b)(1) of the Act, 21 U.S.C. section 360bbb-3(b)(1), unless the authorization is terminated or revoked.  Performed at [facility], [address] [place], [place] [address]   Blood culture (routine x 2)     Status: None (Preliminary result)   Collection Time: 07/12/21  7:52 AM   Specimen: BLOOD  Result Value Ref Range Status   Specimen Description BLOOD LEFT ANTECUBITAL  Final   Special Requests   Final    BOTTLES DRAWN AEROBIC AND ANAEROBIC Blood Culture adequate volume   Culture   Final    NO GROWTH 2 DAYS Performed at [facility], [address], [place], [place] [address]    Report Status PENDING  Incomplete  Blood culture (routine x 2)     Status: None (Preliminary result)   Collection Time: 07/12/21  7:52 AM   Specimen: BLOOD  Result Value Ref Range Status   Specimen Description BLOOD BLOOD LEFT ARM  Final   Special Requests   Final    BOTTLES DRAWN AEROBIC AND ANAEROBIC Blood Culture adequate volume   Culture   Final    NO GROWTH 2 DAYS Performed at [facility], [address] [place],  [address]    Report Status PENDING  Incomplete    Radiology Studies:  No results found.  Scheduled Meds:     atorvastatin  10 mg Oral QHS   gemfibrozil  600 mg Oral BID   heparin  5,000 Units Subcutaneous Q8H   polyethylene glycol  17 g Oral Daily    Continuous Infusions:    ampicillin-sulbactam (UNASYN) IV Stopped (07/14/21 1423)     LOS: 2 days     Vernell Leep, MD,  FACP, [facility], [facility], [facility] (Care Management Physician Certified) [place]  To contact the attending provider between 7A-7P or the covering provider during after hours 7P-7A, please log into the web site [email or web address] and access using universal [place] password for that web site. If you do not have the password, please call the hospital operator.  07/14/2021, 5:47 PM

## 2021-07-14 NOTE — Evaluation (Signed)
Occupational Therapy Evaluation ?Patient Details ?Name: Daniel Bailey. ?MRN: [id number] ?DOB: October 17, 1950 ?Today's Date: 07/14/2021 ? ? ?History of Present Illness Patient is a 71 year old male with a PMH (+) for significant for baseline cognitive impairment, HTN, type II DM, HLD, anemia, glaucoma, and sleep apnea. Admitted for sepsis.  ? ?Clinical Impression ?  ?Pt sen for OT evaluation this date in setting of acute hospitalization d/t sepsis. He reports being INDEP at baseline with mobility and basic self care and contributes to household IADLs in the home in which he lives with his cousin [name]. Is it is a [facility], mobile home, with ramped entrance. Pt presents this date with some generalized weakness and deconditioning. He currently requires: SETUP for seated UB ADLs, MOD A for LB ADLs, CGA to MIN A with RW for STS, SUPV to MIN A for bed mobility. Pt returned to bed end of session with all needs met and in reach. His cousin [name] is present throughout and very helpful and supportive. Will continue to follow acutely. Recommend HHOT f/u services.  ?   ? ?Recommendations for follow up therapy are one component of a multi-disciplinary discharge planning process, led by the attending physician.  Recommendations may be updated based on patient status, additional functional criteria and insurance authorization.  ? ?Follow Up Recommendations ? Home health OT  ?  ?Assistance Recommended at Discharge Intermittent Supervision/Assistance  ?Patient can return home with the following A little help with walking and/or transfers;Assistance with cooking/housework ? ?  ?Functional Status Assessment ? Patient has had a recent decline in their functional status and demonstrates the ability to make significant improvements in function in a reasonable and predictable amount of time.  ?Equipment Recommendations ? None recommended by OT  ?  ?Recommendations for Other Services   ? ? ?  ?Precautions / Restrictions Precautions ?Precautions:  Fall ?Restrictions ?Weight Bearing Restrictions: No  ? ?  ? ?Mobility Bed Mobility ?Overal bed mobility: Needs Assistance ?Bed Mobility: Supine to Sit, Sit to Supine ?  ?  ?Supine to sit: Supervision, HOB elevated ?Sit to supine: Min assist ?  ?General bed mobility comments: increased time for LE back to bed end of session, citing fatigue ?  ? ?Transfers ?Overall transfer level: Needs assistance ?Equipment used: Rolling walker (2 wheels) ?Transfers: Sit to/from Stand ?Sit to Stand: Min guard, Min assist ?  ?  ?  ?  ?  ?  ?  ? ?  ?Balance Overall balance assessment: Needs assistance ?Sitting-balance support: Bilateral upper extremity supported, Feet supported ?Sitting balance-Leahy Scale: Good ?  ?  ?Standing balance support: Bilateral upper extremity supported ?Standing balance-Leahy Scale: Fair ?  ?  ?  ?  ?  ?  ?  ?  ?  ?  ?  ?  ?   ? ?ADL either performed or assessed with clinical judgement  ? ?ADL   ?  ?  ?  ?  ?  ?  ?  ?  ?  ?  ?  ?  ?  ?  ?  ?  ?  ?  ?  ?General ADL Comments: SETUP for seated UB ADLs, MOD A for LB ADLs, CGA to MIN A with RW for STS, SUPV to MIN A for bed mobility.  ? ? ? ?Vision Patient Visual Report: No change from baseline ?   ?   ?Perception   ?  ?Praxis   ?  ? ?Pertinent Vitals/Pain Pain Assessment ?Pain Assessment: No/denies pain  ? ? ? ?  Hand Dominance Left ?  ?Extremity/Trunk Assessment Upper Extremity Assessment ?Upper Extremity Assessment: Overall WFL for tasks assessed;Generalized weakness (ROM WFL, MMT grossly 4-/5, UEs noted to be mildly edematous seemingly) ?  ?Lower Extremity Assessment ?Lower Extremity Assessment: Overall WFL for tasks assessed;Generalized weakness ?  ?  ?  ?Communication Communication ?Communication: No difficulties ?  ?Cognition Arousal/Alertness: Awake/alert ?Behavior During Therapy: [facility] for tasks assessed/performed ?Overall Cognitive Status: History of cognitive impairments - at baseline ?  ?  ?  ?  ?  ?  ?  ?  ?  ?  ?  ?  ?  ?  ?  ?  ?General Comments:  oriented to self, some aspects of place "hospital", some aspects of situation "sick", and year, but not month. ?  ?  ?General Comments    ? ?  ?Exercises Other Exercises ?Other Exercises: OT ed with pt and his cousin [name] who is present throughout re: role of OT in acute setting and in general as well as d/c recs. ?  ?Shoulder Instructions    ? ? ?Home Living Family/patient expects to be discharged to:: Private residence ?Living Arrangements: Other relatives (cousin) ?Available Help at Discharge: Family ?Type of Home: Mobile home ?Home Access: Ramped entrance ?  ?  ?Home Layout: One level ?  ?  ?Bathroom Shower/Tub: Tub/shower unit ?  ?Bathroom Toilet: Standard ?  ?  ?Home Equipment: None ?  ?  ?  ? ?  ?Prior Functioning/Environment Prior Level of Function : Independent/Modified Independent ?  ?  ?  ?  ?  ?  ?  ?  ?  ? ?  ?  ?OT Problem List: Decreased activity tolerance ?  ?   ?OT Treatment/Interventions: Self-care/ADL training;Therapeutic exercise;Therapeutic activities;DME and/or AE instruction  ?  ?OT Goals(Current goals can be found in the care plan section) Acute Rehab OT Goals ?Patient Stated Goal: to go home ?OT Goal Formulation: With patient ?Time For Goal Achievement: 07/28/21 ?Potential to Achieve Goals: Good ?ADL Goals ?Pt Will Perform Lower Body Dressing: with modified independence;sit to/from stand ?Pt Will Transfer to Toilet: with modified independence;ambulating ?Pt Will Perform Toileting - Clothing Manipulation and hygiene: with modified independence;sit to/from stand ?Pt/caregiver will Perform Home Exercise Program: Increased strength;Both right and left upper extremity;With Supervision  ?OT Frequency: Min 2X/week ?  ? ?Co-evaluation   ?  ?  ?  ?  ? ?  ?AM-PAC OT "6 Clicks" Daily Activity     ?Outcome Measure Help from another person eating meals?: None ?Help from another person taking care of personal grooming?: A Little ?Help from another person toileting, which includes using toliet, bedpan, or  urinal?: A Little ?Help from another person bathing (including washing, rinsing, drying)?: A Little ?Help from another person to put on and taking off regular upper body clothing?: None ?Help from another person to put on and taking off regular lower body clothing?: A Little ?6 Click Score: 20 ?  ?End of Session Equipment Utilized During Treatment: Rolling walker (2 wheels) ? ?Activity Tolerance: Patient tolerated treatment well ?Patient left: in bed;with call bell/phone within reach;with bed alarm set;with family/visitor present ? ?OT Visit Diagnosis: Muscle weakness (generalized) (M62.81)  ?              ?Time: 4287-6811 ?OT Time Calculation (min): 31 min ?Charges:  OT General Charges ?$OT Visit: 1 Visit ?OT Evaluation ?$OT Eval Moderate Complexity: 1 Mod ?OT Treatments ?$Self Care/Home Management : 8-22 mins ? ?Gerrianne Scale, [place], OTR/L ?ascom [phone] ?07/14/21,  5:26 PM  ?

## 2021-07-14 NOTE — TOC Initial Note (Signed)
Transition of Care (TOC) - Initial/Assessment Note  ? ? ?Patient Details  ?Name: Daniel Bailey. ?MRN: [id number] ?Date of Birth: 1950-06-05 ? ?Transition of Care (TOC) CM/SW Contact:    ?Candie Chroman, LCSW ?Phone Number: ?07/14/2021, 12:52 PM ? ?Clinical Narrative:  Patient not fully oriented. Called patient's cousin/legal guardian [name], introduced role, and explained that therapy recommendations would be discussed. Patient lives home with Ms. Slade's son at [address] [place], [place] [address]. She is agreeable to home health. Reviewed options and she prefers [place]. Referral accepted for PT and OT. Will review DME recommendations closer to discharge. No further concerns. CSW encouraged [name] to contact CSW as needed. CSW will continue to follow patient and [name] for support and facilitate return home when stable.               ? ?Expected Discharge Plan: [place] ?Barriers to Discharge: Continued Medical Work up ? ? ?Patient Goals and CMS Choice ?  ?CMS Medicare.gov Compare Post Acute Care list provided to:: Legal Guardian (Reviewed over the phone) ?Choice offered to / list presented to : [facility] POA / Guardian ? ?Expected Discharge Plan and Services ?Expected Discharge Plan: [place] ?  ?  ?Post Acute Care Choice: Home Health ?Living arrangements for the past 2 months: [place] ?                ?  ?  ?  ?  ?  ?HH Arranged: PT, OT ?[place] Agency: [place] ?Date HH Agency Contacted: 07/14/21 ?  ?Representative spoke with at [place]: [name] ? ?Prior Living Arrangements/Services ?Living arrangements for the past 2 months: [place] ?Lives with:: Relatives (Cousin's son) ?Patient language and need for interpreter reviewed:: Yes ?Do you feel safe going back to the place where you live?: Yes      ?Need for Family Participation in Patient Care: Yes (Comment) ?Care giver support system in place?: Yes (comment) ?  ?Criminal Activity/Legal  Involvement Pertinent to Current Situation/Hospitalization: No - Comment as needed ? ?Activities of Daily Living ?Home Assistive Devices/Equipment: Eyeglasses ?ADL Screening (condition at time of admission) ?Patient's cognitive ability adequate to safely complete daily activities?: No ?Is the patient deaf or have difficulty hearing?: No ?Does the patient have difficulty seeing, even when wearing glasses/contacts?: No ?Does the patient have difficulty concentrating, remembering, or making decisions?: Yes ?Patient able to express need for assistance with ADLs?: No ?Does the patient have difficulty dressing or bathing?: Yes ?Independently performs ADLs?: No ?Communication: Independent ?Dressing (OT): Needs assistance ?Is this a change from baseline?: Change from baseline, expected to last <3days ?Grooming: Needs assistance ?Is this a change from baseline?: Change from baseline, expected to last <3 days ?Feeding: Independent ?Bathing: Needs assistance ?Is this a change from baseline?: Change from baseline, expected to last <3 days ?Toileting: Needs assistance ?Is this a change from baseline?: Change from baseline, expected to last <3 days ?In/Out Bed: Needs assistance ?Is this a change from baseline?: Change from baseline, expected to last <3 days ?Walks in Home: Independent ?Does the patient have difficulty walking or climbing stairs?: Yes ?Weakness of Legs: Both ?Weakness of Arms/Hands: Both ? ?Permission Sought/Granted ?Permission sought to share information with : Customer service manager, Guardian ?  ? Share Information with NAME: [name] ? Permission granted to share info w AGENCY: [facility] ? Permission granted to share info w Relationship: Cousin/Legal Guardian ? Permission granted  to share info w Contact Information: [phone] ? ?Emotional Assessment ?Appearance:: Appears stated age ?Attitude/Demeanor/Rapport: Unable to Assess ?Affect (typically observed): Unable to Assess ?Orientation:  : Oriented to Self, Oriented to Place ?Alcohol / Substance Use: Not Applicable ?Psych Involvement: No (comment) ? ?Admission diagnosis:  Sepsis ([place]) [A41.9] ?Altered mental status, unspecified altered mental status type [R41.82] ?Community acquired pneumonia of left lower lobe of lung [J18.9] ?Patient Active Problem List  ? Diagnosis Date Noted  ? Lactic acidosis 07/13/2021  ? Acute metabolic encephalopathy 69/45/0388  ? Acute respiratory failure with hypoxia ([place]) 07/13/2021  ? Sepsis ([place]) 07/12/2021  ? Aspiration pneumonia ([place])   ? Severe sepsis ([place])   ? AKI (acute kidney injury) ([place])   ? Hyperkalemia   ? Hypotension due to hypovolemia   ? Hyponatremia 05/26/2021  ? Decreased GFR 05/26/2021  ? Normocytic anemia 05/26/2021  ? Cerumen impaction 05/17/2021  ? Change in mental status 05/17/2021  ? Frequent urination 05/17/2021  ? Tremor 02/09/2021  ? Generalized weakness 02/09/2021  ? Ataxia 02/09/2021  ? Elevated PSA 09/05/2017  ? Leukocytopenia 09/01/2016  ? Mixed hyperlipidemia due to type 2 diabetes mellitus ([place]) 02/19/2014  ? Encounter for Medicare annual wellness exam 08/27/2012  ? Obesity 08/18/2011  ? Colon cancer screening 08/18/2011  ? Routine general medical examination at a health care facility 08/10/2011  ? Prostate cancer screening 08/10/2011  ? Controlled type 2 diabetes mellitus without complication, without long-term current use of insulin ([place]) 05/30/2010  ? HYPERTENSION, BENIGN ESSENTIAL 12/10/2006  ? Intellectual disability 11/23/2006  ? ?PCP:  [name], MD ?Pharmacy:   ?[facility] - [place], [place] - [address], [address] ?[address], [address] ?[place] [address] ?Phone: [phone] Fax: [phone] ? ?[id number] [place], [place] - [id number]?Phone: [phone]: [phone]? ? ? ? ?Social Determinants of Health (SDOH) Interventions ?  ? ?Readmission Risk Interventions ?No flowsheet data  found. ? ? ?

## 2021-07-15 DIAGNOSIS — J9601 Acute respiratory failure with hypoxia: Secondary | ICD-10-CM | POA: Diagnosis not present

## 2021-07-15 DIAGNOSIS — N179 Acute kidney failure, unspecified: Secondary | ICD-10-CM | POA: Diagnosis not present

## 2021-07-15 DIAGNOSIS — A419 Sepsis, unspecified organism: Secondary | ICD-10-CM | POA: Diagnosis not present

## 2021-07-15 DIAGNOSIS — T68XXXA Hypothermia, initial encounter: Secondary | ICD-10-CM

## 2021-07-15 DIAGNOSIS — G9341 Metabolic encephalopathy: Secondary | ICD-10-CM | POA: Diagnosis not present

## 2021-07-15 LAB — BASIC METABOLIC PANEL
Anion gap: 6 (ref 5–15)
BUN: 26 mg/dL — ABNORMAL HIGH (ref 8–23)
CO2: 25 mmol/L (ref 22–32)
Calcium: 8.5 mg/dL — ABNORMAL LOW (ref 8.9–10.3)
Chloride: 106 mmol/L (ref 98–111)
Creatinine, Ser: 1.45 mg/dL — ABNORMAL HIGH (ref 0.61–1.24)
GFR, Estimated: 52 mL/min — ABNORMAL LOW (ref >60)
Glucose, Bld: 114 mg/dL — ABNORMAL HIGH (ref 70–99)
Potassium: 3.9 mmol/L (ref 3.5–5.1)
Sodium: 137 mmol/L (ref 135–145)

## 2021-07-15 LAB — CBC
HCT: 21.9 % — ABNORMAL LOW (ref 39.0–52.0)
Hemoglobin: 7.1 g/dL — ABNORMAL LOW (ref 13.0–17.0)
MCH: 30.7 pg (ref 26.0–34.0)
MCHC: 32.4 g/dL (ref 30.0–36.0)
MCV: 94.8 fL (ref 80.0–100.0)
Platelets: 186 10*3/uL (ref 150–400)
RBC: 2.31 MIL/uL — ABNORMAL LOW (ref 4.22–5.81)
RDW: 18.2 % — ABNORMAL HIGH (ref 11.5–15.5)
WBC: 5.2 10*3/uL (ref 4.0–10.5)
nRBC: 0 % (ref 0.0–0.2)

## 2021-07-15 LAB — GLUCOSE, CAPILLARY
Glucose-Capillary: 98 mg/dL (ref 70–99)
Glucose-Capillary: 109 mg/dL — ABNORMAL HIGH (ref 70–99)

## 2021-07-15 LAB — PHOSPHORUS: Phosphorus: 3.3 mg/dL (ref 2.5–4.6)

## 2021-07-15 LAB — TSH: TSH: 3.819 u[IU]/mL (ref 0.350–4.500)

## 2021-07-15 MED ORDER — PANTOPRAZOLE SODIUM 40 MG PO TBEC
40.0000 mg | DELAYED_RELEASE_TABLET | Freq: Every day | ORAL | Status: DC
  Administered 2021-07-15 – 2021-07-18 (×4): 40 mg via ORAL
  Filled 2021-07-15 (×4): qty 1

## 2021-07-15 MED ORDER — MIDODRINE HCL 5 MG PO TABS
5.0000 mg | ORAL_TABLET | Freq: Three times a day (TID) | ORAL | Status: DC
  Administered 2021-07-15 – 2021-07-18 (×12): 5 mg via ORAL
  Filled 2021-07-15 (×12): qty 1

## 2021-07-15 NOTE — Progress Notes (Signed)
?   07/15/21 1000  ?Assess: MEWS Score  ?Temp (!) 95.2 ?F (35.1 ?C)  ?BP (!) 92/50  ?Pulse Rate (!) 57  ?Resp 11  ?SpO2 98 %  ?Assess: MEWS Score  ?MEWS Temp 1  ?MEWS Systolic 1  ?MEWS Pulse 0  ?MEWS RR 1  ?MEWS LOC 0  ?MEWS Score 3  ?MEWS Score Color Yellow  ?Assess: if the MEWS score is Yellow or Red  ?Were vital signs taken at a resting state? Yes  ?Focused Assessment No change from prior assessment  ?Does the patient meet 2 or more of the SIRS criteria? No  ?MEWS guidelines implemented *See Row Information* No, previously yellow, continue vital signs every 4 hours  ?Treat  ?Pain Scale 0-10  ?Pain Score 0  ?Take Vital Signs  ?Increase Vital Sign Frequency  Yellow: Q 2hr X 2 then Q 4hr X 2, if remains yellow, continue Q 4hrs  ?Escalate  ?MEWS: Escalate Yellow: discuss with charge nurse/RN and consider discussing with provider and RRT  ?Notify: Charge Nurse/RN  ?Name of Charge Nurse/RN Notified Rochel Brome, RN  ?Date Charge Nurse/RN Notified 07/15/21  ?Time Charge Nurse/RN Notified 1000  ?Notify: Provider  ?Provider Name/Title Dr. Algis Liming  ?Date Provider Notified 07/15/21  ?Time Provider Notified 1000  ?Notification Type Face-to-face  ?Notification Reason Change in status  ?Provider response At bedside  ?Date of Provider Response 07/15/21  ?Time of Provider Response 1000  ?Document  ?Patient Outcome Stabilized after interventions  ?Assess: SIRS CRITERIA  ?SIRS Temperature  1  ?SIRS Pulse 0  ?SIRS Respirations  0  ?SIRS WBC 0  ?SIRS Score Sum  1  ? ?Bear Hugger applied.  ?

## 2021-07-15 NOTE — Progress Notes (Signed)
Patient Alert and Oriented. Patient is Yellow MEWS with decrease b/p, RR, and temp. NP placed new order of midodrine for b/p. Will follow protocol and continue to monitor v/s. ?

## 2021-07-15 NOTE — Progress Notes (Signed)
Physical Therapy Treatment ?Patient Details ?Name: Daniel Bailey. ?MRN: [id number] ?DOB: 1950-08-16 ?Today's Date: 07/15/2021 ? ? ?History of Present Illness Patient is a 71 year old male with a PMH (+) for significant for baseline cognitive impairment, HTN, type II DM, HLD, anemia, glaucoma, and sleep apnea. Admitted for sepsis. ? ?  ?PT Comments  ? ? Patient tolerated session well and was agreeable to treatment. Upon entry into room patient was in bed with bed in chair position, with RN taking patient's vitals, and patient's caregiver present. No pain reported throughout session. Patient was able to complete supine<>sitting at supervision with no physical assistance from author with [place] elevated. Cueing required to use hands on bed rails for increased ease. Patient was able to complete sit to stand at [facility] with no physical assistance, however upon standing, utilized counter for stability. [facility] after 1 LOB that required Min A to correct, patient was given RW for increased stability. Stability and balance improved when ambulating with RW, however patient did demonstrate 1 more additional LOB during a quick directional change that required Min A to correct. Patient left in bed (in chair position) with caregiver present. Patient is progressing towards his goals and would continue to benefit from skilled physical therapy in order to optimize patient's return to PLOF. Continue to recommend HHPT upon discharge from acute hospitalization.  ?  ?Recommendations for follow up therapy are one component of a multi-disciplinary discharge planning process, led by the attending physician.  Recommendations may be updated based on patient status, additional functional criteria and insurance authorization. ? ?Follow Up Recommendations ? Home health PT ?  ?  ?Assistance Recommended at Discharge Intermittent Supervision/Assistance  ?Patient can return home with the  following Direct supervision/assist for medications management;Direct supervision/assist for financial management;A little help with walking and/or transfers;Assist for transportation ?  ?Equipment Recommendations ? Rolling walker (2 wheels)  ?  ?Recommendations for Other Services   ? ? ?  ?Precautions / Restrictions Precautions ?Precautions: Fall ?Restrictions ?Weight Bearing Restrictions: No  ?  ? ?Mobility ? Bed Mobility ?Overal bed mobility: Needs Assistance ?Bed Mobility: Supine to Sit, Sit to Supine ?  ?  ?Supine to sit: Supervision, HOB elevated ?Sit to supine: Supervision, HOB elevated ?  ?  ?  ? ?Transfers ?Overall transfer level: Needs assistance ?Equipment used: None ?Transfers: Sit to/from Stand ?Sit to Stand:  (SBA) ?  ?  ?  ?  ?  ?General transfer comment: able to come to standing without any physical assistance or AD however utilized sink/sounter for stability upon standing ?  ? ?Ambulation/Gait ?Ambulation/Gait assistance: Min guard ?Gait Distance (Feet): 320 Feet ?Assistive device: Rolling walker (2 wheels) ?Gait Pattern/deviations: Step-through pattern, Decreased step length - right, Decreased step length - left, Narrow base of support, Trunk flexed ?Gait velocity: decreased ?  ?  ?General Gait Details: [facility], however after 1 LOB that required Min A to correct, patient was given a RW for increased stability during functional activity, patient demonstrated 1 more LOB when changing directions that required Min A to correct ? ? ?Stairs ?  ?  ?  ?  ?  ? ? ?Wheelchair Mobility ?  ? ?Modified Rankin (Stroke Patients Only) ?  ? ? ?  ?Balance Overall balance assessment: Needs assistance ?Sitting-balance support: Bilateral upper extremity supported, Feet supported ?Sitting balance-Leahy Scale: Good ?  ?  ?Standing balance support: Bilateral upper extremity  supported, During functional activity, Reliant on assistive device for balance ?Standing balance-Leahy Scale:  Fair ?  ?  ?  ?  ?  ?  ?  ?  ?  ?  ?  ?  ?  ? ?  ?Cognition Arousal/Alertness: Awake/alert ?Behavior During Therapy: [facility] for tasks assessed/performed ?Overall Cognitive Status: History of cognitive impairments - at baseline ?  ?  ?  ?  ?  ?  ?  ?  ?  ?  ?  ?  ?  ?  ?  ?  ?  ?  ?  ? ?  ?Exercises   ? ?  ?General Comments General comments (skin integrity, edema, etc.): SpO2 remained >90% throughout session on room air ?  ?  ? ?Pertinent Vitals/Pain Pain Assessment ?Pain Assessment: No/denies pain  ? ? ?Home Living   ?  ?  ?  ?  ?  ?  ?  ?  ?  ?   ?  ?Prior Function    ?  ?  ?   ? ?PT Goals (current goals can now be found in the care plan section) Acute Rehab PT Goals ?Patient Stated Goal: to go home ?PT Goal Formulation: With patient ?Time For Goal Achievement: 07/28/21 ?Potential to Achieve Goals: Good ?Progress towards PT goals: Progressing toward goals ? ?  ?Frequency ? ? ? Min 2X/week ? ? ? ?  ?PT Plan Current plan remains appropriate  ? ? ?Co-evaluation   ?  ?  ?  ?  ? ?  ?AM-PAC PT "6 Clicks" Mobility   ?Outcome Measure ? Help needed turning from your back to your side while in a flat bed without using bedrails?: A Little ?Help needed moving from lying on your back to sitting on the side of a flat bed without using bedrails?: A Little ?Help needed moving to and from a bed to a chair (including a wheelchair)?: A Little ?Help needed standing up from a chair using your arms (e.g., wheelchair or bedside chair)?: A Little ?Help needed to walk in hospital room?: A Little ?Help needed climbing 3-5 steps with a railing? : A Little ?6 Click Score: 18 ? ?  ?End of Session Equipment Utilized During Treatment: Gait belt ?Activity Tolerance: Patient tolerated treatment well ?Patient left: in bed;with call bell/phone within reach;with family/visitor present ?Nurse Communication: Mobility status ?PT Visit Diagnosis: Unsteadiness on feet (R26.81);Muscle weakness (generalized) (M62.81);Difficulty in walking, not elsewhere  classified (R26.2) ?  ? ? ?Time: 7711-6579 ?PT Time Calculation (min) (ACUTE ONLY): 22 min ? ?Charges:  $Gait Training: 8-22 mins          ?          ? ?Iva Boop, PT  ?07/15/21. 2:36 PM ? ? ?

## 2021-07-15 NOTE — Progress Notes (Signed)
Occupational Therapy Treatment ?Patient Details ?Name: Daniel Bailey. ?MRN: [id number] ?DOB: 01-24-1951 ?Today's Date: 07/15/2021 ? ? ?History of present illness Patient is a 71 year old male with a PMH (+) for significant for baseline cognitive impairment, HTN, type II DM, HLD, anemia, glaucoma, and sleep apnea. Admitted for sepsis. ?  ?OT comments ? Pt seen for OT tx this date to f/u re: safety with ADLs/ADL mobility. He presents this date with decreased fxl activity tolerance, but is pleasant and agreeable to session. Initially trial'ed standing with no AD, but pt noted to have two instances of near LOB, so RW implemented and pt able to complete subsequent transfers and fxl mobility with CGA with RW. Pt transfers to commode in restroom with cues for safety and use of grab bar and requires MOD A for thorough completion of posterior peri care. Pt up to chair end of session with all needs met and in reach and family member present. Pt's RN present to administer medication at end of session. Will continue to follow.  ? ?Recommendations for follow up therapy are one component of a multi-disciplinary discharge planning process, led by the attending physician.  Recommendations may be updated based on patient status, additional functional criteria and insurance authorization. ?   ?Follow Up Recommendations ? Home health OT  ?  ?Assistance Recommended at Discharge Intermittent Supervision/Assistance  ?Patient can return home with the following ? A little help with walking and/or transfers;Assistance with cooking/housework ?  ?Equipment Recommendations ? None recommended by OT  ?  ?Recommendations for Other Services   ? ?  ?Precautions / Restrictions Precautions ?Precautions: Fall ?Restrictions ?Weight Bearing Restrictions: No  ? ? ?  ? ?Mobility Bed Mobility ?Overal bed mobility: Needs Assistance ?Bed Mobility: Supine to Sit, Sit to Supine ?  ?  ?Supine to sit: Supervision, HOB elevated ?  ?  ?  ?  ? ?Transfers ?Overall  transfer level: Needs assistance ?Equipment used: Rolling walker (2 wheels) ?Transfers: Sit to/from Stand ?Sit to Stand: Min guard, Supervision ?  ?  ?  ?  ?  ?  ?  ?  ?Balance Overall balance assessment: Needs assistance ?Sitting-balance support: Bilateral upper extremity supported, Feet supported ?Sitting balance-Leahy Scale: Good ?  ?  ?Standing balance support: Bilateral upper extremity supported ?Standing balance-Leahy Scale: Fair ?  ?  ?  ?  ?  ?  ?  ?  ?  ?  ?  ?  ?   ? ?ADL either performed or assessed with clinical judgement  ? ?ADL Overall ADL's : Needs assistance/impaired ?  ?  ?Grooming: Set up;Min guard;Standing ?Grooming Details (indicate cue type and reason): sink-side with RW for hand hygiene ?  ?  ?  ?  ?  ?  ?  ?  ?Toilet Transfer: Min guard;Cueing for safety;Ambulation;Rolling walker (2 wheels);Grab bars ?  ?Toileting- Clothing Manipulation and Hygiene: Moderate assistance;Sit to/from stand ?Toileting - Clothing Manipulation Details (indicate cue type and reason): MOD A for thorough completion of posterior peri care ?  ?  ?Functional mobility during ADLs: Min guard;Minimal assistance;Rolling walker (2 wheels) (occasional need for MIN A to correct/improve balance.) ?  ?  ? ?Extremity/Trunk Assessment   ?  ?  ?  ?  ?  ? ?Vision   ?  ?  ?Perception   ?  ?Praxis   ?  ? ?Cognition Arousal/Alertness: Awake/alert ?Behavior During Therapy: [facility] for tasks assessed/performed ?Overall Cognitive Status: History of cognitive impairments - at baseline ?  ?  ?  ?  ?  ?  ?  ?  ?  ?  ?  ?  ?  ?  ?  ?  ?  General Comments: able to follow all simple commands appropriately ?  ?  ?   ?Exercises   ? ?  ?Shoulder Instructions   ? ? ?  ?General Comments SpO2 remained >90% throughout session on room air  ? ? ?Pertinent Vitals/ Pain       Pain Assessment ?Pain Assessment: No/denies pain ? ?Home Living   ?  ?  ?  ?  ?  ?  ?  ?  ?  ?  ?  ?  ?  ?  ?  ?  ?  ?  ? ?  ?Prior Functioning/Environment    ?  ?  ?  ?   ? ?Frequency ? Min  2X/week  ? ? ? ? ?  ?Progress Toward Goals ? ?OT Goals(current goals can now be found in the care plan section) ? Progress towards OT goals: Progressing toward goals ? ?Acute Rehab OT Goals ?Patient Stated Goal: to go home ?OT Goal Formulation: With patient ?Time For Goal Achievement: 07/28/21 ?Potential to Achieve Goals: Good  ?Plan Discharge plan remains appropriate   ? ?Co-evaluation ? ? ?   ?  ?  ?  ?  ? ?  ?AM-PAC OT "6 Clicks" Daily Activity     ?Outcome Measure ? ? Help from another person eating meals?: None ?Help from another person taking care of personal grooming?: A Little ?Help from another person toileting, which includes using toliet, bedpan, or urinal?: A Little ?Help from another person bathing (including washing, rinsing, drying)?: A Little ?Help from another person to put on and taking off regular upper body clothing?: None ?Help from another person to put on and taking off regular lower body clothing?: A Little ?6 Click Score: 20 ? ?  ?End of Session Equipment Utilized During Treatment: Rolling walker (2 wheels) ? ?OT Visit Diagnosis: Muscle weakness (generalized) (M62.81) ?  ?Activity Tolerance Patient tolerated treatment well ?  ?Patient Left with call bell/phone within reach;in chair;with chair alarm set (batteries dead in posey box, RN notified via secure chat) ?  ?Nurse Communication Mobility status ?  ? ?   ? ?Time: 2505-3976 ?OT Time Calculation (min): 23 min ? ?Charges: OT General Charges ?$OT Visit: 1 Visit ?OT Treatments ?$Self Care/Home Management : 8-22 mins ?$Therapeutic Activity: 8-22 mins ? ?Gerrianne Scale, [place], OTR/L ?ascom [phone] ?07/15/21, 4:27 PM  ?

## 2021-07-15 NOTE — Progress Notes (Addendum)
PROGRESS NOTE   Daniel Bailey.  EVO:[id number]    DOB: [birth year]    DOA: 07/12/2021  PCP: Daniel Greenspan, MD   I have briefly reviewed patients previous medical [facility] in [facility].  Chief Complaint  Patient presents with   Altered Mental Status    Hospital Course:  71 year old male with medical history significant for baseline cognitive impairment, HTN, type II DM, HLD, anemia, glaucoma, sleep apnea, presented to the ED due to altered mental status, vomiting and poor oral intake.  At baseline patient repeats himself, talks, feeds himself, walks, does chores around the house.  Does not drive or shop.  Intermittent cough and congestion.  In the ED, hypotensive with SBP's in the 80s-90s, minimally responsive to 3 L IV saline bolus and maintenance IV fluids, tachypneic up to 28, CT chest positive for left lower lobe pneumonia consistent with aspiration, hyperkalemia/6.2, creatinine 2.83, elevated lactate.  Admitted for aspiration pneumonia, AKI and acute metabolic encephalopathy.  CCM consulted.  Improved.  Unclear etiology regarding periodic episodes of hypotension and hypothermia.   Assessment & Plan:  Principal Problem:   Sepsis ([place]) Active Problems:   Aspiration pneumonia (HCC)   AKI (acute kidney injury) ([place])   Hyperkalemia   Lactic acidosis   Acute metabolic encephalopathy   Normocytic anemia   Acute respiratory failure with hypoxia (HCC)   Hypothermia   Assessment and Plan: * Sepsis ([place]) On further review, had severe sepsis on admission due to presence of sepsis along with organ dysfunction including AKI, lactic acidosis and mental status changes. Due to aspiration pneumonia. Met sepsis criteria on admission. Treated with aggressive IV fluid boluses and maintenance IV fluids and broad-spectrum antibiotics. Having intermittent episodes of hypothermia and hypotension despite patient denying complaints, looking well and nontoxic.   Random cortisol 23.  Blood cultures  x2 from admission negative to date. Plan to discuss with family to see if any of these are chronic.  Aspiration pneumonia ([place]) Not hypoxic. We will transition from IV Unasyn to Augmentin in AM. Likely due to mental status changes. SLP evaluation appreciated.  Do not feel that he has oropharyngeal dysphagia.  His aspiration may be related to underlying esophageal pathology.  If he is unable to tolerate p.o., could consider GI consultation, either outpatient or inpatient. SLP follow-up appreciated and have recommended mechanical soft diet, thin liquids and PPI.  AKI (acute kidney injury) ([place]) Presented with creatinine of 2.21. Likely related to GI losses and poor oral intake. Creatinine continues to steadily improve, down to 1.45. Now appears volume overloaded due to substantial IV fluids during this hospitalization.  Upper extremity edema noted.  Discontinued IV fluids.  Hyperkalemia Presented with potassium of 6.2. S/p calcium gluconate, Lokelma, albuterol and insulin Treated and resolved.  Lactic acidosis Resolved.  Acute metabolic encephalopathy Multifactorial due to sepsis, AKI and pneumonia. Will need to check with family regarding his baseline and current status. Supportive care. His current mental status may be his baseline.  Hypothermia Unclear etiology. Having intermittent episodes of hypothermia although patient himself denies complaints, looks well, not septic or toxic and mentating at his baseline normally. Random cortisol: 23, TSH 3.82.  Acute respiratory failure with hypoxia (HCC) Acute respiratory failure was present on admission.  Needed brief CPAP followed by BiPAP. PCCM input appreciated.  Indicated that patient has OSA.  Placed on CPAP nightly. Overnight 3/9, patient had a couple of apneic spells while on CPAP and was transitioned to BiPAP by RT. As per guardian, did not  know of OSA diagnosis and did not use CPAP prior to admission. Acute respiratory  failure resolved  Normocytic anemia Presented with hemoglobin of 9.8 which has gradually dropped to a low of 7.1 in the absence of overt bleeding. May be multifactorial related to dilutional, acute illness.  Anemia panel unremarkable thus far and suggestive of chronic disease. Check FOBT. Follow-up CBC in a.m. and transfuse if hemoglobin 7 g or less.    There is no height or weight on file to calculate BMI.  Nutritional Status        Pressure Ulcer:     DVT prophylaxis: heparin injection 5,000 Units Start: 07/12/21 1400     Code Status: DNR:  Family Communication: Discussed with patient's legal guardian, Ms. Daniel Bailey.  She indicates that patient lives with her son.  He is independent and carries out chores at home.  She does not know of prior hypothermia or hypotension.  She does not know of him being diagnosed with OSA and he does not use CPAP at night.  She consents to blood transfusion if needed. Disposition:  Status is: Inpatient Remains inpatient appropriate because: Severity of illness    Consultants:   CCM  Procedures:     Antimicrobials:   As above   Subjective:  RN reported hypothermia this morning.  When by bedside almost immediately.  Patient denies any complaints.  States that he feels "fine".  Denies coughing, dyspnea or pain.  Did not put feeling cold or chills.  Objective:   Vitals:   07/15/21 1058 07/15/21 1208 07/15/21 1403 07/15/21 1619  BP: (!) 90/55 (!) 90/47 (!) 106/53 107/62  Pulse: (!) 49 63 61 (!) 52  Resp: _0 Temp: (!) 97.5 F (36.4 C) 98.4 F (36.9 C) 98.8 F (37.1 C) 97.8 F (36.6 C)  TempSrc: Oral Oral Oral   SpO2: 98% 90% 99% 98%    General exam: Elderly male, moderately built and obese lying comfortably propped up in bed.  Does not appear septic or toxic. Respiratory system: Occasional basal crackles but otherwise clear to auscultation.  No increased work of breathing. Cardiovascular system: S1 & S2 heard, RRR. No JVD,  murmurs, rubs, gallops or clicks. No pedal edema.  Has bilateral upper extremity trace edema, slowly improving.   Gastrointestinal system: Abdomen is nondistended but obese, soft and nontender. No organomegaly or masses felt. Normal bowel sounds heard.  Uncomplicated umbilical hernia. Central nervous system: Alert and oriented x2. No focal neurological deficits. Extremities: Grade 5 x 5 power in all limbs.  No tremors over the last 2 days. Skin: No rashes, lesions or ulcers Psychiatry: Judgement and insight appear impaired.  Mood & affect pleasant.    Data Reviewed:   I have personally reviewed following labs and imaging studies   CBC: Recent Labs  Lab 07/12/21 0635 07/13/21 0420 07/14/21 0555 07/15/21 0455  WBC 17.1* 9.3 5.6 5.2  NEUTROABS 15.9*  --   --   --   HGB 9.8* 7.5* 7.4* 7.1*  HCT 28.8* 23.0* 22.9* 21.9*  MCV 90.9 93.5 95.0 94.8  PLT 284 210 177 504    Basic Metabolic Panel: Recent Labs  Lab 07/12/21 0635 07/12/21 1556 07/13/21 0420 07/14/21 0555 07/15/21 0455  NA 136 138 139 138 137  K 6.2* 5.0 4.8 5.0 3.9  CL 96* 107 109 108 106  CO2 25 21* _1 GLUCOSE 239* 160* 106* 153* 114*  BUN 41* 42* 34* 24* 26*  CREATININE 2.83* 2.21*  1.82* 1.52* 1.45*  CALCIUM 9.6 8.4* 8.8* 9.0 8.5*  PHOS  --   --   --   --  3.3    Liver Function Tests: Recent Labs  Lab 07/12/21 0635  AST 62*  ALT 26  ALKPHOS 104  BILITOT 0.7  PROT 7.8  ALBUMIN 3.5    CBG: Recent Labs  Lab 07/14/21 1135 07/15/21 0639 07/15/21 1653  GLUCAP 202* 98 109*    Microbiology Studies:   Recent Results (from the past 240 hour(s))  Resp Panel by RT-PCR (Flu A&B, Covid) Nasopharyngeal Swab     Status: None   Collection Time: 07/12/21  6:35 AM   Specimen: Nasopharyngeal Swab; Nasopharyngeal(NP) swabs in vial transport medium  Result Value Ref Range Status   SARS Coronavirus 2 by RT PCR NEGATIVE NEGATIVE Final    Comment: (NOTE) SARS-CoV-2 target nucleic acids are NOT  DETECTED.  The SARS-CoV-2 RNA is generally detectable in upper respiratory specimens during the acute phase of infection. The lowest concentration of SARS-CoV-2 viral copies this assay can detect is 138 copies/mL. A negative result does not preclude SARS-Cov-2 infection and should not be used as the sole basis for treatment or other patient management decisions. A negative result may occur with  improper specimen collection/handling, submission of specimen other than nasopharyngeal swab, presence of viral mutation(s) within the areas targeted by this assay, and inadequate number of viral copies(<138 copies/mL). A negative result must be combined with clinical observations, patient history, and epidemiological information. The expected result is Negative.  Fact Sheet for Patients:  EntrepreneurPulse.com.au  Fact Sheet for Healthcare Providers:  IncredibleEmployment.be  This test is no t yet approved or cleared by the [place] FDA and  has been authorized for detection and/or diagnosis of SARS-CoV-2 by FDA under an Emergency Use Authorization (EUA). This EUA will remain  in effect (meaning this test can be used) for the duration of the COVID-19 declaration under Section 564(b)(1) of the Act, 21 U.S.C.section 360bbb-3(b)(1), unless the authorization is terminated  or revoked sooner.       Influenza A by PCR NEGATIVE NEGATIVE Final   Influenza B by PCR NEGATIVE NEGATIVE Final    Comment: (NOTE) The Xpert Xpress SARS-CoV-2/FLU/RSV plus assay is intended as an aid in the diagnosis of influenza from Nasopharyngeal swab specimens and should not be used as a sole basis for treatment. Nasal washings and aspirates are unacceptable for Xpert Xpress SARS-CoV-2/FLU/RSV testing.  Fact Sheet for Patients: EntrepreneurPulse.com.au  Fact Sheet for Healthcare Providers: IncredibleEmployment.be  This test is not yet  approved or cleared by the [place] FDA and has been authorized for detection and/or diagnosis of SARS-CoV-2 by FDA under an Emergency Use Authorization (EUA). This EUA will remain in effect (meaning this test can be used) for the duration of the COVID-19 declaration under Section 564(b)(1) of the Act, 21 U.S.C. section 360bbb-3(b)(1), unless the authorization is terminated or revoked.  Performed at [facility], [address] [place], [place] [address]   Blood culture (routine x 2)     Status: None (Preliminary result)   Collection Time: 07/12/21  7:52 AM   Specimen: BLOOD  Result Value Ref Range Status   Specimen Description BLOOD LEFT ANTECUBITAL  Final   Special Requests   Final    BOTTLES DRAWN AEROBIC AND ANAEROBIC Blood Culture adequate volume   Culture   Final    NO GROWTH 3 DAYS Performed at [facility], [address] [place], [place] [address]  Report Status PENDING  Incomplete  Blood culture (routine x 2)     Status: None (Preliminary result)   Collection Time: 07/12/21  7:52 AM   Specimen: BLOOD  Result Value Ref Range Status   Specimen Description BLOOD BLOOD LEFT ARM  Final   Special Requests   Final    BOTTLES DRAWN AEROBIC AND ANAEROBIC Blood Culture adequate volume   Culture   Final    NO GROWTH 3 DAYS Performed at [facility], [address], [place], [place] [address]    Report Status PENDING  Incomplete    Radiology Studies:  No results found.  Scheduled Meds:    atorvastatin  10 mg Oral QHS   gemfibrozil  600 mg Oral BID   heparin  5,000 Units Subcutaneous Q8H   midodrine  5 mg Oral TID WC   pantoprazole  40 mg Oral Daily   polyethylene glycol  17 g Oral Daily    Continuous Infusions:    ampicillin-sulbactam (UNASYN) IV 3 g (07/15/21 1515)     LOS: 3 days     Vernell Leep, MD,  FACP, [facility], [facility], [facility] (Care Management Physician Certified) [place]  To contact the attending provider between 7A-7P or the covering provider during after hours 7P-7A, please log into the web site [email or web address] and access using universal [place] password for that web site. If you do not have the password, please call the hospital operator.  07/15/2021, 5:49 PM

## 2021-07-15 NOTE — Care Management Important Message (Signed)
Important Message ? ?Patient Details  ?Name: Daniel Bailey. ?MRN: [id number] ?Date of Birth: January 16, 1951 ? ? ?Medicare Important Message Given:  N/A - LOS <3 / Initial given by admissions ? ? ? ? ?Dannette Barbara ?07/15/2021, 8:28 AM ?

## 2021-07-15 NOTE — Assessment & Plan Note (Addendum)
Unclear etiology. ?Having intermittent episodes of hypothermia although patient himself denies complaints, looks well, not septic or toxic and mentating at his baseline normally. ?Random cortisol: 23, TSH 3.82. ?None within the last 72 hours. ?

## 2021-07-15 NOTE — Evaluation (Addendum)
Clinical/Bedside Swallow Evaluation Patient Details  Name: Daniel Bailey. MRN: [id number] Date of Birth: [birth year]  Today's Date: 07/15/2021 Time: SLP Start Time (ACUTE ONLY): 0825 SLP Stop Time (ACUTE ONLY): 0925 SLP Time Calculation (min) (ACUTE ONLY): 60 min  Past Medical History:  Past Medical History:  Diagnosis Date   Anemia    Elevated PSA    Frequent urination    Glaucoma    History of COVID-19    Hyperglycemia    Hyperlipidemia    Hypertension    Sleep apnea    Past Surgical History:  Past Surgical History:  Procedure Laterality Date   COLONOSCOPY     PROSTATE BIOPSY N/A 11/20/2017   Procedure: BIOPSY TRANSRECTAL ULTRASONIC PROSTATE (TUBP);  Surgeon: Daniel Sons, MD;  Location: [facility] ORS;  Service: Urology;  Laterality: N/A;   HPI:  Pt is a 71 y.o. male with medical history significant of Baseline Cognitive Impairment, HTN, REFLUX, Type II DM, HLD, anemia, glaucoma, sleep apnea.  He lives w/ family who assist w/ ADLs.  Pt presents to the ED with some generalized weakness and deconditioning; sepsis dx'd.  CT of Abd/Chest.: Left lower lobe pneumonia, suspect underlying aspiration given  fluid distended esophagus and left lower lobe airway opacification.  2. Gas and fluid distended stomach.  A CXR in Jan 2023 revealed: No active cardiopulmonary disease then.    Assessment / Plan / Recommendation  Clinical Impression  Pt appears to present w/ adequate oropharyngeal phase swallow function w/ No immediate, overt oropharyngeal phase dysphagia noted, No overt neuromuscular deficits noted during po trials. Pt does have a Baseline dx of Cognitive Impairment which can impact overall awareness/timing of swallow and safety during po tasks which increases risk for aspiration, choking. Pt also has a Baseline of REFLUX presentation ("a fluid distended Esophagus", per study in chart notes). ANY Dysmotility or Regurgitation of Reflux material can increase risk for aspiration of the Reflux  material during Retrograde flow thus impact Pulmonary status.     Pt's risk for aspiration is present in setting of Cognitive Impairment, Esophageal dysmotility, Edentulous status, and acute illness/deconditioning but can be reduced when following general aspiration precautions and using a modified diet consistency w/ Supervision and Support at meals for follow-through w/ aspiration precautions.      Pt consumed po trials w/ No immediate, overt clinical s/s of aspiration during po trials. During po trials, pt consumed all consistencies w/ no immediate overt coughing, decline in vocal quality, or change in respiratory presentation during/post trials. O2 sats remained 97%. Of note, he exhibited a cough/throat clearing at end of drinking a juice when he tilted his head back to get the last of the juice. Education given on NOT doing that behavior -- not tilting his head back to drain a cup. NO STRAWS were used when drinking as well to improve controlled sipping.  Oral phase appeared grossly [facility] w/ timely bolus management, mashing/gumming of softened foods, and control of bolus propulsion for A-P transfer for swallowing. Oral clearing achieved w/ all trial consistencies. Pt is Edentulous which resulted in min increased oral phase time w/ soft solids for chewing. OM Exam appeared [facility] w/ no gross unilateral weakness noted. Speech Clear, soft. Pt fed self w/ setup support -- min Impulsive drinking behaviors noted(cues gives to use ONE sip at a time).   Recommend a more Mech Soft consistency diet w/ chopped meats, moistened foods; Thin liquids VIA CUP - pt does best w/ Cup drinking for control. Recommend general aspiration precautions  w/ cues and Supervision for follow through, REFLUX precautions. Pills WHOLE vs Crushed in Puree for safer, easier swallowing dt/ Baseline Cognitive Impairment. Education given on Pills in Puree; food consistencies and easy to eat options; general aspiration precautions w/ Supervision to pt  and NSG. NSG to reconsult if any new needs arise. NSG agreed. MD updated on above and request for PPI for pt's REFLUX presentation per chart note.    ADDENDUM: Spoke w/ legal guardian/family member whom pt lives w/ re: eval results above and recommendations including Pills in Puree. She was in agreement.  SLP Visit Diagnosis: Dysphagia, unspecified (R13.10) (Edentulous Baseline; Cognitive Impairment/decline and REFLUX behaviors Baseline)    Aspiration Risk  Mild aspiration risk;Risk for inadequate nutrition/hydration (reduced when following general aspiration precautions; Supervision)    Diet Recommendation   Mech Soft consistency diet w/ chopped meats, moistened foods; Thin liquids VIA CUP - pt does best w/ Cup drinking for control. Recommend general aspiration precautions w/ cues and Supervision for follow through, REFLUX precautions  Medication Administration: Whole meds with puree (vs Crushed as needed)    Other  Recommendations Recommended Consults: Consider GI evaluation;Consider esophageal assessment (Dietician f/u) Oral Care Recommendations: Oral care BID;Oral care before and after PO;Staff/trained caregiver to provide oral care (support) Other Recommendations:  (n/a)    Recommendations for follow up therapy are one component of a multi-disciplinary discharge planning process, led by the attending physician.  Recommendations may be updated based on patient status, additional functional criteria and insurance authorization.  Follow up Recommendations No SLP follow up      Assistance Recommended at Discharge Intermittent Supervision/Assistance  Functional Status Assessment Patient has had a recent decline in their functional status and demonstrates the ability to make significant improvements in function in a reasonable and predictable amount of time.  Frequency and Duration  (n/a)   (n/a)       Prognosis Prognosis for Safe Diet Advancement: Good Barriers to Reach Goals: Cognitive  deficits;Language deficits;Time post onset;Severity of deficits Barriers/Prognosis Comment: impulsive behavior; cognitive decline baseline      Swallow Study   General Date of Onset: 07/12/21 HPI: Pt is a 71 y.o. male with medical history significant of Baseline Cognitive Impairment, HTN, REFLUX, Type II DM, HLD, anemia, glaucoma, sleep apnea.  He lives w/ family who assist w/ ADLs.  Pt presents to the ED with some generalized weakness and deconditioning; sepsis dx'd.  CT of Abd/Chest.: Left lower lobe pneumonia, suspect underlying aspiration given  fluid distended esophagus and left lower lobe airway opacification.  2. Gas and fluid distended stomach.  A CXR in Jan 2023 revealed: No active cardiopulmonary disease then. Type of Study: Bedside Swallow Evaluation Previous Swallow Assessment: none Diet Prior to this Study: Regular;Thin liquids Temperature Spikes Noted: No (wbc 5.2) Respiratory Status: Room air History of Recent Intubation: No Behavior/Cognition: Alert;Cooperative;Pleasant mood;Confused;Distractible;Requires cueing (responds to direct questions) Oral Cavity Assessment: Within Functional Limits Oral Care Completed by SLP: Yes Oral Cavity - Dentition: Edentulous Vision: Functional for self-feeding Self-Feeding Abilities: Able to feed self;Needs assist;Needs set up (cues) Patient Positioning: Upright in bed (needed positioning) Baseline Vocal Quality: Normal Volitional Cough: Strong Volitional Swallow: Able to elicit    Oral/Motor/Sensory Function Overall Oral Motor/Sensory Function: Within functional limits   Ice Chips Ice chips: Within functional limits Presentation: Spoon (fed; 3 trials)   Thin Liquid Thin Liquid: Within functional limits Presentation: Cup;Self Fed (15+ trials) Other Comments: coughed at end of drinking a juice when he tilted his head back to get  the last of the juice. Education given on NOT doing that behavior.    Nectar Thick Nectar Thick Liquid: Not  tested   Honey Thick Honey Thick Liquid: Not tested   Puree Puree: Within functional limits Presentation: Self Fed;Spoon (4 ozs)   Solid     Solid: Impaired (edentulous) Presentation: Self Fed;Spoon (8 trials) Oral Phase Impairments: Impaired mastication (edentulous) Oral Phase Functional Implications:  (min extra Time needed to gum/mash) Pharyngeal Phase Impairments:  (none)        Orinda Kenner, MS, CCC-SLP Speech Language Pathologist Rehab Services; [place] [phone] (ascom) Rosalinda Seaman 07/15/2021,11:53 AM

## 2021-07-15 NOTE — Progress Notes (Signed)
Patient with periods of apnea tonight in cpap mode. Change over to bipap mode. Sent message to provider  ?

## 2021-07-16 DIAGNOSIS — G4733 Obstructive sleep apnea (adult) (pediatric): Secondary | ICD-10-CM

## 2021-07-16 LAB — BASIC METABOLIC PANEL
Anion gap: 8 (ref 5–15)
BUN: 21 mg/dL (ref 8–23)
CO2: 25 mmol/L (ref 22–32)
Calcium: 8.5 mg/dL — ABNORMAL LOW (ref 8.9–10.3)
Chloride: 106 mmol/L (ref 98–111)
Creatinine, Ser: 1.34 mg/dL — ABNORMAL HIGH (ref 0.61–1.24)
GFR, Estimated: 57 mL/min — ABNORMAL LOW (ref >60)
Glucose, Bld: 93 mg/dL (ref 70–99)
Potassium: 4 mmol/L (ref 3.5–5.1)
Sodium: 139 mmol/L (ref 135–145)

## 2021-07-16 LAB — ABO/RH: ABO/RH(D): AB NEG

## 2021-07-16 LAB — CBC
HCT: 18.8 % — ABNORMAL LOW (ref 39.0–52.0)
Hemoglobin: 6 g/dL — ABNORMAL LOW (ref 13.0–17.0)
MCH: 30.5 pg (ref 26.0–34.0)
MCHC: 31.9 g/dL (ref 30.0–36.0)
MCV: 95.4 fL (ref 80.0–100.0)
Platelets: 251 10*3/uL (ref 150–400)
RBC: 1.97 MIL/uL — ABNORMAL LOW (ref 4.22–5.81)
RDW: 17.7 % — ABNORMAL HIGH (ref 11.5–15.5)
WBC: 5.3 10*3/uL (ref 4.0–10.5)
nRBC: 0 % (ref 0.0–0.2)

## 2021-07-16 LAB — PREPARE RBC (CROSSMATCH)

## 2021-07-16 LAB — GLUCOSE, CAPILLARY: Glucose-Capillary: 134 mg/dL — ABNORMAL HIGH (ref 70–99)

## 2021-07-16 MED ORDER — SODIUM CHLORIDE 0.9% IV SOLUTION: Freq: Once | INTRAVENOUS | Status: DC

## 2021-07-16 MED ORDER — AMOXICILLIN-POT CLAVULANATE 875-125 MG PO TABS
1.0000 | ORAL_TABLET | Freq: Two times a day (BID) | ORAL | Status: DC
Start: 2021-07-16 — End: 2021-07-19
  Administered 2021-07-16 – 2021-07-18 (×5): 1 via ORAL
  Filled 2021-07-16 (×5): qty 1

## 2021-07-16 NOTE — Progress Notes (Signed)
Called by RN for pt Bipap alarming. Pt with low RR and low Ve. Pt changed to Bipap mode. Pt tol well at this time. ?

## 2021-07-16 NOTE — Progress Notes (Signed)
Pt sittng in recliner chair. Pt requested to go to the bathroom. Disconnected from external cathert/suction, Assisted pt to the bathroom with walker. Patient begin having BM on the way to the bathroom. Barely made it to the commode. Pt had large dark brown loose and some solid stool. Stool and urine still missed the commode. But the majority made it into the commode. Cleaned bathroom floor and gave Pt a full bath after getting Pt back to the bed. Pt states he feels better and is now watching tv. Complains of no pain or discomfort.  ?Will continue to monitor pt to end of shift. ?

## 2021-07-16 NOTE — Progress Notes (Signed)
Mobility Specialist - Progress Note ? ? ? 07/16/21 1500  ?Mobility  ?Activity Refused mobility  ? ? ?Pt in chair using RA upon arrival. Mobility specialist to hold off on session d/t blood being drawn per RN. Second attempt this date. Will attempt when appropriate at later date/time. ? ?Merrily Brittle ?Mobility Specialist ?07/16/21, 3:39 PM ? ? ? ? ?

## 2021-07-16 NOTE — Assessment & Plan Note (Addendum)
Per discussion with legal guardian, no known history of OSA and was not on CPAP PTA. ?Pulmonologist on admission and indicated OSA and placed on CPAP ?Over the last 2 nights, patient having apneic spells while on CPAP and hence RT changed to BiPAP. ?Communicated with pulmonology who indicated that patient will need a outpatient formal PSG and transcutaneous CO2 monitoring but will evaluate patient prior to discharge to see if he will qualify for NIV such as Trilogy ?Pulmonology follow-up appreciated, getting nocturnal pulse oximetry overnight prior to admission and patient did not have hypoxia.  I communicated with pulmonology who recommended discharging him on no CPAP and will follow-up in office for formal evaluation of OSA and management. ?

## 2021-07-16 NOTE — Progress Notes (Addendum)
PROGRESS NOTE   Daniel Bailey.  AYO:[id number]    DOB: 1950/10/10    DOA: 07/12/2021  PCP: [name], MD   I have briefly reviewed patients previous medical [facility].  Chief Complaint  Patient presents with   Altered Mental Status    Hospital Course:  71 year old male with medical history significant for baseline cognitive impairment, HTN, type II DM, HLD, anemia, glaucoma, sleep apnea, presented to the ED due to altered mental status, vomiting and poor oral intake.  At baseline patient repeats himself, talks, feeds himself, walks, does chores around the house.  Does not drive or shop.  Intermittent cough and congestion.  In the ED, hypotensive with SBP's in the 80s-90s, minimally responsive to 3 L IV saline bolus and maintenance IV fluids, tachypneic up to 28, CT chest positive for left lower lobe pneumonia consistent with aspiration, hyperkalemia/6.2, creatinine 2.83, elevated lactate.  Admitted for aspiration pneumonia, AKI and acute metabolic encephalopathy, all improved.  Unclear etiology regarding periodic episodes of hypotension and hypothermia.  Hemoglobin dropped to 6 g per DL in the absence of overt bleeding, transfusing 2 units PRBC.  Follows with outpatient hematology for anemia evaluation.   Assessment & Plan:  Principal Problem:   Sepsis ([place]) Active Problems:   Aspiration pneumonia (HCC)   AKI (acute kidney injury) ([place])   Hyperkalemia   Lactic acidosis   Acute metabolic encephalopathy   Normocytic anemia   Acute respiratory failure with hypoxia (HCC)   Hypothermia   OSA (obstructive sleep apnea)   Assessment and Plan: * Sepsis ([place]) On further review, had severe sepsis on admission due to presence of sepsis along with organ dysfunction including AKI, lactic acidosis and mental status changes. Due to aspiration pneumonia. Met sepsis criteria on admission. Treated with aggressive IV fluid boluses and maintenance IV fluids and broad-spectrum  antibiotics. Having intermittent episodes of hypothermia and hypotension despite patient denying complaints, looking well and nontoxic.  No significant episodes in the last 24 hours. Random cortisol 23.  Blood cultures x2 from admission negative to date. Legal guardian unaware of issues with hypotension or hypothermia.  Aspiration pneumonia ([place]) Not hypoxic. Transitioned from IV Unasyn to Augmentin to complete course. Likely due to mental status changes. SLP evaluation appreciated.  Do not feel that he has oropharyngeal dysphagia.  His aspiration may be related to underlying esophageal pathology.  If he is unable to tolerate p.o., could consider GI consultation, either outpatient or inpatient. SLP follow-up appreciated and have recommended mechanical soft diet, thin liquids and PPI.  AKI (acute kidney injury) ([place]) Presented with creatinine of 2.21. Likely related to GI losses and poor oral intake. Creatinine continues to steadily improve, down to 1.34 Now appears volume overloaded due to substantial IV fluids during this hospitalization.  Upper extremity edema noted.  Discontinued IV fluids.  Hyperkalemia Presented with potassium of 6.2. S/p calcium gluconate, Lokelma, albuterol and insulin Treated and resolved.  Lactic acidosis Resolved.  Acute metabolic encephalopathy Multifactorial due to sepsis, AKI and pneumonia. Will need to check with family regarding his baseline and current status. Supportive care. His current mental status may be his baseline.  OSA (obstructive sleep apnea) Per discussion with legal guardian, no known history of OSA and was not on CPAP PTA. Pulmonologist on admission and indicated OSA and placed on CPAP Over the last 2 nights, patient having apneic spells while on CPAP and hence RT changed to BiPAP. Communicated with pulmonology who indicated that patient will need a  outpatient formal PSG and transcutaneous CO2 monitoring but will evaluate patient  prior to discharge to see if he will qualify for NIV such as Trilogy  Hypothermia Unclear etiology. Having intermittent episodes of hypothermia although patient himself denies complaints, looks well, not septic or toxic and mentating at his baseline normally. Random cortisol: 23, TSH 3.82.  Acute respiratory failure with hypoxia (HCC) Acute respiratory failure was present on admission.  Needed brief CPAP followed by BiPAP. PCCM input appreciated.  Indicated that patient has OSA.  Placed on CPAP nightly. Overnight 3/9, patient had a couple of apneic spells while on CPAP and was transitioned to BiPAP by RT. As per guardian, did not know of OSA diagnosis and did not use CPAP prior to admission. Acute respiratory failure resolved Will discuss with PCCM regarding issues with apnea while on CPAP mode and needing to change to BiPAP and outpatient management of this  Normocytic anemia Presented with hemoglobin of 9.8 which has gradually dropped to a low of 7.1 in the absence of overt bleeding. May be multifactorial related to dilutional, acute illness.  Anemia panel unremarkable thus far and suggestive of chronic disease. Check FOBT. Hemoglobin dropped to 6 g per DL in the absence of overt bleeding.  Transfusing 2 units PRBCs.  Requested RN to check FOBT and observe stools for blood or melena but no reports thus far. Has seen hematology on 07/05/2021.  Immunofixation without evidence of monoclonal protein.  Elevated light chains, unclear significance.  LDH normal.  Copper level elevated mildly.  No significant immunophenotypic abnormality detected on flow cytometry panel. Outpatient follow-up with hematology ?  EGD and colonoscopy as outpatient.  Last colonoscopy in 2013 and chart was normal.    There is no height or weight on file to calculate BMI.  Nutritional Status        Pressure Ulcer:     DVT prophylaxis: heparin injection 5,000 Units Start: 07/12/21 1400     Code Status: DNR:   Family Communication: None at bedside. Disposition:  Status is: Inpatient Remains inpatient appropriate because: Severity of illness    Consultants:   CCM  Procedures:     Antimicrobials:   As above   Subjective:  Seen along with nurse tech at bedside.  Denies complaints.  Thus far no reports of bleeding or melena.  Objective:   Vitals:   07/16/21 1016 07/16/21 1335 07/16/21 1410 07/16/21 1425  BP: (!) 99/56 110/60 115/72   Pulse: 60 (!) 54 (!) 45   Resp: _0 Temp: 97.6 F (36.4 C) 97.6 F (36.4 C) (!) 97.5 F (36.4 C) 97.7 F (36.5 C)  TempSrc: Oral     SpO2: 97% 96% 100%     General exam: Elderly male, moderately built and obese lying comfortably propped up in bed.  Does not appear septic or toxic. Respiratory system: Clear to auscultation.  No increased work of breathing. Cardiovascular system: S1 & S2 heard, RRR. No JVD, murmurs, rubs, gallops or clicks. No pedal edema.  Has bilateral upper extremity trace edema, slowly improving but still has some.   Gastrointestinal system: Abdomen is nondistended but obese, soft and nontender. No organomegaly or masses felt. Normal bowel sounds heard.  Uncomplicated umbilical hernia. Central nervous system: Alert and oriented x2. No focal neurological deficits. Extremities: Grade 5 x 5 power in all limbs.  No tremors over the last 2 days. Skin: No rashes, lesions or ulcers Psychiatry: Judgement and insight appear impaired.  Mood & affect pleasant.  Data Reviewed:   I have personally reviewed following labs and imaging studies   CBC: Recent Labs  Lab 07/12/21 0635 07/13/21 0420 07/14/21 0555 07/15/21 0455 07/16/21 0552  WBC 17.1*   < > 5.6 5.2 5.3  NEUTROABS 15.9*  --   --   --   --   HGB 9.8*   < > 7.4* 7.1* 6.0*  HCT 28.8*   < > 22.9* 21.9* 18.8*  MCV 90.9   < > 95.0 94.8 95.4  PLT 284   < > 177 186 251   < > = values in this interval not displayed.    Basic Metabolic Panel: Recent Labs  Lab  07/12/21 1556 07/13/21 0420 07/14/21 0555 07/15/21 0455 07/16/21 0552  NA 138 139 138 137 139  K 5.0 4.8 5.0 3.9 4.0  CL 107 109 108 106 106  CO2 21* _0 GLUCOSE 160* 106* 153* 114* 93  BUN 42* 34* 24* 26* 21  CREATININE 2.21* 1.82* 1.52* 1.45* 1.34*  CALCIUM 8.4* 8.8* 9.0 8.5* 8.5*  PHOS  --   --   --  3.3  --     Liver Function Tests: Recent Labs  Lab 07/12/21 0635  AST 62*  ALT 26  ALKPHOS 104  BILITOT 0.7  PROT 7.8  ALBUMIN 3.5    CBG: Recent Labs  Lab 07/14/21 1135 07/15/21 0639 07/15/21 1653  GLUCAP 202* 98 109*    Microbiology Studies:   Recent Results (from the past 240 hour(s))  Resp Panel by RT-PCR (Flu A&B, Covid) Nasopharyngeal Swab     Status: None   Collection Time: 07/12/21  6:35 AM   Specimen: Nasopharyngeal Swab; Nasopharyngeal(NP) swabs in vial transport medium  Result Value Ref Range Status   SARS Coronavirus 2 by RT PCR NEGATIVE NEGATIVE Final    Comment: (NOTE) SARS-CoV-2 target nucleic acids are NOT DETECTED.  The SARS-CoV-2 RNA is generally detectable in upper respiratory specimens during the acute phase of infection. The lowest concentration of SARS-CoV-2 viral copies this assay can detect is 138 copies/mL. A negative result does not preclude SARS-Cov-2 infection and should not be used as the sole basis for treatment or other patient management decisions. A negative result may occur with  improper specimen collection/handling, submission of specimen other than nasopharyngeal swab, presence of viral mutation(s) within the areas targeted by this assay, and inadequate number of viral copies(<138 copies/mL). A negative result must be combined with clinical observations, patient history, and epidemiological information. The expected result is Negative.  Fact Sheet for Patients:  EntrepreneurPulse.com.au  Fact Sheet for Healthcare Providers:  IncredibleEmployment.be  This test is no t yet  approved or cleared by the [place] FDA and  has been authorized for detection and/or diagnosis of SARS-CoV-2 by FDA under an Emergency Use Authorization (EUA). This EUA will remain  in effect (meaning this test can be used) for the duration of the COVID-19 declaration under Section 564(b)(1) of the Act, 21 U.S.C.section 360bbb-3(b)(1), unless the authorization is terminated  or revoked sooner.       Influenza A by PCR NEGATIVE NEGATIVE Final   Influenza B by PCR NEGATIVE NEGATIVE Final    Comment: (NOTE) The Xpert Xpress SARS-CoV-2/FLU/RSV plus assay is intended as an aid in the diagnosis of influenza from Nasopharyngeal swab specimens and should not be used as a sole basis for treatment. Nasal washings and aspirates are unacceptable for Xpert Xpress SARS-CoV-2/FLU/RSV testing.  Fact Sheet for Patients: EntrepreneurPulse.com.au  Fact Sheet  for Healthcare Providers: IncredibleEmployment.be  This test is not yet approved or cleared by the [place] and has been authorized for detection and/or diagnosis of SARS-CoV-2 by FDA under an Emergency Use Authorization (EUA). This EUA will remain in effect (meaning this test can be used) for the duration of the COVID-19 declaration under Section 564(b)(1) of the Act, 21 U.S.C. section 360bbb-3(b)(1), unless the authorization is terminated or revoked.  Performed at [facility], [address] [place], [place] [address]   Blood culture (routine x 2)     Status: None (Preliminary result)   Collection Time: 07/12/21  7:52 AM   Specimen: BLOOD  Result Value Ref Range Status   Specimen Description BLOOD LEFT ANTECUBITAL  Final   Special Requests   Final    BOTTLES DRAWN AEROBIC AND ANAEROBIC Blood Culture adequate volume   Culture   Final    NO GROWTH 4 DAYS Performed at [facility], [address], [place], [place] [address]    Report Status PENDING  Incomplete   Blood culture (routine x 2)     Status: None (Preliminary result)   Collection Time: 07/12/21  7:52 AM   Specimen: BLOOD  Result Value Ref Range Status   Specimen Description BLOOD BLOOD LEFT ARM  Final   Special Requests   Final    BOTTLES DRAWN AEROBIC AND ANAEROBIC Blood Culture adequate volume   Culture   Final    NO GROWTH 4 DAYS Performed at [facility], [address] [place], [place] [address]    Report Status PENDING  Incomplete    Radiology Studies:  No results found.  Scheduled Meds:    sodium chloride   Intravenous Once   amoxicillin-clavulanate  1 tablet Oral Q12H   atorvastatin  10 mg Oral QHS   gemfibrozil  600 mg Oral BID   heparin  5,000 Units Subcutaneous Q8H   midodrine  5 mg Oral TID WC   pantoprazole  40 mg Oral Daily   polyethylene glycol  17 g Oral Daily    Continuous Infusions:       LOS: 4 days     Vernell Leep, MD,  FACP, [facility], [facility], [facility] (Care Management Physician Certified) [place]  To contact the attending provider between 7A-7P or the covering provider during after hours 7P-7A, please log into the web site [email or web address] and access using universal [place] password for that web site. If you do not have the password, please call the hospital operator.  07/16/2021, 3:38 PM

## 2021-07-16 NOTE — Plan of Care (Signed)

## 2021-07-17 DIAGNOSIS — T68XXXD Hypothermia, subsequent encounter: Secondary | ICD-10-CM

## 2021-07-17 DIAGNOSIS — G4733 Obstructive sleep apnea (adult) (pediatric): Secondary | ICD-10-CM

## 2021-07-17 LAB — BASIC METABOLIC PANEL
Anion gap: 6 (ref 5–15)
BUN: 20 mg/dL (ref 8–23)
CO2: 26 mmol/L (ref 22–32)
Calcium: 8.6 mg/dL — ABNORMAL LOW (ref 8.9–10.3)
Chloride: 105 mmol/L (ref 98–111)
Creatinine, Ser: 1.21 mg/dL (ref 0.61–1.24)
GFR, Estimated: >60 mL/min (ref >60)
Glucose, Bld: 96 mg/dL (ref 70–99)
Potassium: 3.4 mmol/L — ABNORMAL LOW (ref 3.5–5.1)
Sodium: 137 mmol/L (ref 135–145)

## 2021-07-17 LAB — GLUCOSE, CAPILLARY
Glucose-Capillary: 85 mg/dL (ref 70–99)
Glucose-Capillary: 99 mg/dL (ref 70–99)
Glucose-Capillary: 105 mg/dL — ABNORMAL HIGH (ref 70–99)

## 2021-07-17 LAB — BPAM RBC
Blood Product Expiration Date: 202304012359
Blood Product Expiration Date: 202304022359
ISSUE DATE / TIME: 202303110957
ISSUE DATE / TIME: 202303111346
Unit Type and Rh: 600
Unit Type and Rh: 600

## 2021-07-17 LAB — CBC
HCT: 27.1 % — ABNORMAL LOW (ref 39.0–52.0)
Hemoglobin: 9.2 g/dL — ABNORMAL LOW (ref 13.0–17.0)
MCH: 30.4 pg (ref 26.0–34.0)
MCHC: 33.9 g/dL (ref 30.0–36.0)
MCV: 89.4 fL (ref 80.0–100.0)
Platelets: 194 10*3/uL (ref 150–400)
RBC: 3.03 MIL/uL — ABNORMAL LOW (ref 4.22–5.81)
RDW: 18.1 % — ABNORMAL HIGH (ref 11.5–15.5)
WBC: 5.4 10*3/uL (ref 4.0–10.5)
nRBC: 0 % (ref 0.0–0.2)

## 2021-07-17 LAB — CULTURE, BLOOD (ROUTINE X 2)
Culture: NO GROWTH
Culture: NO GROWTH
Special Requests: ADEQUATE
Special Requests: ADEQUATE

## 2021-07-17 LAB — TYPE AND SCREEN
ABO/RH(D): AB NEG
Antibody Screen: NEGATIVE
Unit division: 0
Unit division: 0

## 2021-07-17 LAB — OCCULT BLOOD X 1 CARD TO LAB, STOOL: Fecal Occult Bld: POSITIVE — AB

## 2021-07-17 MED ORDER — POTASSIUM CHLORIDE CRYS ER 20 MEQ PO TBCR
40.0000 meq | EXTENDED_RELEASE_TABLET | Freq: Once | ORAL | Status: AC
  Administered 2021-07-17: 40 meq via ORAL
  Filled 2021-07-17: qty 2

## 2021-07-17 NOTE — Progress Notes (Signed)
?   07/17/21 0441  ?Assess: MEWS Score  ?Temp (!) 97.4 ?F (36.3 ?C)  ?BP (!) 88/49 ?(Reported to RN Nurse.)  ?Pulse Rate (!) 45  ?Resp 16  ?SpO2 100 %  ?O2 Device Room Air  ?Assess: MEWS Score  ?MEWS Temp 0  ?MEWS Systolic 1  ?MEWS Pulse 1  ?MEWS RR 0  ?MEWS LOC 0  ?MEWS Score 2  ?MEWS Score Color Yellow  ?Assess: if the MEWS score is Yellow or Red  ?Were vital signs taken at a resting state? Yes  ?Focused Assessment Change from prior assessment (see assessment flowsheet)  ?Does the patient meet 2 or more of the SIRS criteria? No  ?MEWS guidelines implemented *See Row Information* Yes  ?Treat  ?MEWS Interventions Other (Comment) ?(Pt asymptomatic, Hx- hypotension -NT notified RN of low b/p & rechkd it and was still low, Rise Paganini, NP who requested map results of B/P. Map results were wnl according to provider. No new orders given but to continue monitoring B/P per protocol.)  ?Pain Scale 0-10  ?Pain Score 0  ?Take Vital Signs  ?Increase Vital Sign Frequency  Yellow: Q 2hr X 2 then Q 4hr X 2, if remains yellow, continue Q 4hrs ?(Notified Hospitalist)  ?Notify: Charge Nurse/RN  ?Name of Charge Nurse/RN Notified Almyra Free, RN  ?Date Charge Nurse/RN Notified 07/17/21  ?Time Charge Nurse/RN Notified 0530  ?Notify: Provider  ?Provider Name/Title Sharion Settler, NP  ?Date Provider Notified 07/17/21  ?Time Provider Notified [phone]  ?Notification Type Page  ?Notification Reason Other (Comment) ?(Hypotension - [place]ews)  ?Provider response No new orders;Other (Comment) ?(NP requested Map result after notiying them of low b/p- Due to map being normal no orders given.)  ?Date of Provider Response 07/17/21  ?Time of Provider Response 0507  ?Document  ?Patient Outcome Other (Comment) ?(Pt asymptomatic - No orders given, but to continue monitoring patient and B/P)  ?Assess: SIRS CRITERIA  ?SIRS Temperature  0  ?SIRS Pulse 0  ?SIRS Respirations  0  ?SIRS WBC 0  ?SIRS Score Sum  0  ? ? ?After NT notified RN of low b/p, RN  notified provider prior to Astronomer.  ?                                                                                                                                                       Provider requested map results of the initial B/p ( map result 61) and when it was rechecked (map result - 65). Provider explained that map results were wnl- no new orders given. ?Informed NT of [place] protocol in place and to report to oncoming shift. ?

## 2021-07-17 NOTE — Progress Notes (Signed)
?   07/15/21 0434 07/15/21 0542  ?Assess: MEWS Score  ?Temp (!) 96.3 ?F (35.7 ?C)  --   ?BP (!) 84/62 ?(Public affairs consultant notified) (!) [phone]  ?Pulse Rate (!) 48 (!) 52  ?Resp 16 17  ?SpO2 98 % 97 %  ?O2 Device CPAP Bi-PAP  ?Assess: MEWS Score  ?MEWS Temp 1 1  ?MEWS Systolic 1 1  ?MEWS Pulse 1 0  ?MEWS RR 0 0  ?MEWS LOC 0 0  ?MEWS Score 3 2  ?MEWS Score Color Yellow Yellow  ?Assess: if the MEWS score is Yellow or Red  ?Were vital signs taken at a resting state? Yes Yes  ?Focused Assessment No change from prior assessment No change from prior assessment  ?Does the patient meet 2 or more of the SIRS criteria?  --  No  ?MEWS guidelines implemented *See Row Information*  --  Yes  ?Treat  ?MEWS Interventions  --  Administered scheduled meds/treatments  ?Pain Scale 0-10 0-10  ?Pain Score 0 0  ?Take Vital Signs  ?Increase Vital Sign Frequency   --  Yellow: Q 2hr X 2 then Q 4hr X 2, if remains yellow, continue Q 4hrs  ?Escalate  ?MEWS: Escalate Yellow: discuss with charge nurse/RN and consider discussing with provider and RRT Yellow: discuss with charge nurse/RN and consider discussing with provider and RRT  ?Notify: Charge Nurse/RN  ?Name of Charge Nurse/RN Notified Stacy, Advertising account planner, RN  ?Date Charge Nurse/RN Notified 07/15/21 07/15/21  ?Time Charge Nurse/RN Notified [phone]  ?Assess: SIRS CRITERIA  ?SIRS Temperature  1 1  ?SIRS Pulse 0 0  ?SIRS Respirations  0 0  ?SIRS WBC 0 0  ?SIRS Score Sum  1 1  ? ? ?

## 2021-07-17 NOTE — Consult Note (Signed)
NAME:  Daniel Bailey., MRN:  [id number], DOB:  1950-06-12, LOS: 5 ADMISSION DATE:  07/12/2021, CONSULTATION DATE:  07/12/2021 REFERRING MD:  Dr. Ouida Sills, CHIEF COMPLAINT:  Altered Mental Status   Brief Pt Description / Synopsis:  71 y.o. Male admitted with Acute Metabolic Encephalopathy in the setting of sepsis due to suspected Aspiration Pneumonia and Viral Gastroenteritis, along with AKI and Hyperkalemia.  History of Present Illness:  Daniel Bailey is a 71 year old male with a past medical history significant for baseline cognitive impairment, hypertension, diabetes mellitus type 2, hyperlipidemia, anemia, glaucoma, and obstructive sleep apnea who presented to [facility] ED on 07/12/2021 due to complaints of altered mental status and projectile vomiting.  Patient is currently altered, therefore history is obtained from the patient's cousin at bedside along with chart review.  Per family's report on Sunday he became confused, which progressively got worse on Monday, with associated poor p.o. intake, and inability to complete ADLs.  Earlier this morning he had an episode of projectile vomiting which prompted family to seek medical attention.  She also reports intermittent cough and congestion.  She denies any knowledge of chest pain, palpitations, fever or chills, abdominal pain, dysuria.  Of note his cousin reports he had an episode of confusion in January which was worked up by his PCP.  Found only to have anemia which he was referred to hematology/oncology at that time.  07/17/21- patient seen and examined at bedside. He is in good spirits feels well rested reports sleep was good. He does have cognitive impairment and noted apneic episodes while asleep. He would benefit from proper sleep apnea evaluation in lab. This can be done on outpatient.  He can have nocturnal pulse oximetry done for qualification.  I have discussed with [place] and we will try to order CPAP if possible for home use.   Pertinent   Medical History  Cognitive impairment (developmental delay) Hypertension Diabetes mellitus Hyperlipidemia Anemia Glaucoma Obstructive sleep apnea  Micro Data:  07/12/2021: SARS-CoV-2 and influenza PCR>> negative 07/12/2021: Blood culture x2>> 07/12/2021: Sputum>> 07/12/2021: Strep pneumo urinary antigen>>negative 07/12/2021: Legionella urinary antigen>>  Antimicrobials:  Azithromycin x1 dose 3/7 Ceftriaxone 3/7 x 1 dose Unasyn 3/7>>  Significant Hospital Events: Including procedures, antibiotic start and stop dates in addition to other pertinent events   07/12/2021: Presented to ED with altered mental status and poor p.o. intake.  Hospitalist admitting.  PCCM consulted due to soft blood pressure and high risk for decompensation.  CODE STATUS changed to DNR/DNI.    Objective   Blood pressure (!) 90/50, pulse (!) 43, temperature 97.9 F (36.6 C), resp. rate 16, SpO2 99 %.        Intake/Output Summary (Last 24 hours) at 07/17/2021 1259 Last data filed at 07/17/2021 1007 Gross per 24 hour  Intake 1363.5 ml  Output 1400 ml  Net -36.5 ml    There were no vitals filed for this visit.  Examination: General: NAD sitting in bed, on nasal cannula, HENT: Atraumatic, normocephalic, neck supple, no JVD Lungs: Diminished breath sounds bilaterally, even, nonlabored, normal effort Cardiovascular: Regular rate and rhythm, S1-S2, no murmurs, rubs, gallops Abdomen: Soft, nontender, nondistended, no guarding rebound tenderness Extremities: No deformities, no edema, no cyanosis or clubbing Neuro: baseline cog impairment no FND noted GU: Diaper in place, incontinent  Resolved Hospital Problem list     Assessment & Plan:   Sepsis due to suspected Aspiration Pneumonia and Viral Gastroenteritis-RESOLVED  Meets SIRS Criteria: RR 22, WBC 17.1, Lactic acid 3.2 -Monitor  fever curve -Trend WBC's & Procalcitonin -Follow cultures as above -Continue empiric Unasyn pending cultures &  sensitivities   Sleep apnea - central vs obstructive       -  PSG on outpatient        -overnight noc OX study today     - reviewed with RT Sullivan Lone  Relative Hypotension (SBP 90's) PMHx: Hypertension -Continuous cardiac monitoring -Maintain MAP >65 -IV fluids ~ NS @ 150 cc/hr (received 3.5 L IVF resuscitation in ED) -Vasopressors as needed to maintain MAP goal -Continue Midodrine -Lactic acid has normalized (3.2 ~ 2.7 ~ 1.2) -HS Troponin negative x2 -Consider Echocardiogram   Acute Kidney Injury  Hyperkalemia ~ IMPROVED -Monitor I&O's / urinary output -Follow BMP -Ensure adequate renal perfusion -Avoid nephrotoxic agents as able -Replace electrolytes as indicated -IV Fluids -Received Calcium Gluconate, insulin + D50, and Lokelma ~ repeat BMP @ 1600 on 3/7 ~ K+ improved to 5.0 from 6.2  Acute Metabolic Encephalopathy in setting of Sepsis and multiple metabolic derangements -Treat sepsis and metabolic derangements as above -Provide supportive care -Promote normal sleep/wake cycle, promote family presence -Avoid sedating meds as able -CT Head negative   Aspiration pneumonia without hypoxia currently -Supplemental O2 as needed to maintain O2 sats greater than 92% -CPAP nightly and anytime while sleeping -Follow intermittent chest x-ray and ABG as needed   Best Practice (right click and "Reselect all SmartList Selections" daily)   Diet/type: NPO, until mental status improved DVT prophylaxis: prophylactic heparin  GI prophylaxis: N/A Lines: N/A Foley:  N/A Code Status:  DNR/DNI Last date of multidisciplinary goals of care discussion [N/A]  Updated pt's cousin [name] at bedside.  All questions answered.  Labs   CBC: Recent Labs  Lab 07/12/21 0635 07/13/21 0420 07/14/21 0555 07/15/21 0455 07/16/21 0552 07/17/21 0609  WBC 17.1* 9.3 5.6 5.2 5.3 5.4  NEUTROABS 15.9*  --   --   --   --   --   HGB 9.8* 7.5* 7.4* 7.1* 6.0* 9.2*  HCT 28.8* 23.0* 22.9* 21.9*  18.8* 27.1*  MCV 90.9 93.5 95.0 94.8 95.4 89.4  PLT 284 210 177 186 251 194     Basic Metabolic Panel: Recent Labs  Lab 07/13/21 0420 07/14/21 0555 07/15/21 0455 07/16/21 0552 07/17/21 0609  NA 139 138 137 139 137  K 4.8 5.0 3.9 4.0 3.4*  CL 109 108 106 106 105  CO2 '22 23 25 25 26  '$ GLUCOSE 106* 153* 114* 93 96  BUN 34* 24* 26* 21 20  CREATININE 1.82* 1.52* 1.45* 1.34* 1.21  CALCIUM 8.8* 9.0 8.5* 8.5* 8.6*  PHOS  --   --  3.3  --   --     GFR: Estimated Creatinine Clearance: 59.1 mL/min (by C-G formula based on SCr of 1.21 mg/dL). Recent Labs  Lab 07/12/21 0752 07/12/21 0819 07/12/21 0957 07/12/21 1500 07/13/21 0420 07/14/21 0555 07/15/21 0455 07/16/21 0552 07/17/21 0609  PROCALCITON  --  2.35  --   --  1.29 0.52  --   --   --   WBC  --   --   --   --  9.3 5.6 5.2 5.3 5.4  LATICACIDVEN 3.2*  --  2.7* 1.2  --   --   --   --   --      Liver Function Tests: Recent Labs  Lab 07/12/21 0635  AST 62*  ALT 26  ALKPHOS 104  BILITOT 0.7  PROT 7.8  ALBUMIN 3.5  Recent Labs  Lab 07/12/21 0635  LIPASE 53*    Recent Labs  Lab 07/12/21 0635  AMMONIA 32     ABG No results found for: PHART, PCO2ART, PO2ART, HCO3, TCO2, ACIDBASEDEF, O2SAT   Coagulation Profile: Recent Labs  Lab 07/12/21 0635  INR 1.0     Cardiac Enzymes: No results for input(s): CKTOTAL, CKMB, CKMBINDEX, TROPONINI in the last 168 hours.  HbA1C: Hgb A1c MFr Bld  Date/Time Value Ref Range Status  05/17/2021 03:38 PM 6.4 4.6 - 6.5 % Final    Comment:    Glycemic Control Guidelines for People with Diabetes:Non Diabetic:  <6%Goal of Therapy: <7%Additional Action Suggested:  >8%   02/09/2021 10:07 AM 6.4 4.6 - 6.5 % Final    Comment:    Glycemic Control Guidelines for People with Diabetes:Non Diabetic:  <6%Goal of Therapy: <7%Additional Action Suggested:  >8%     CBG: Recent Labs  Lab 07/15/21 0639 07/15/21 1653 07/16/21 1624 07/17/21 0759 07/17/21 1132  GLUCAP 98 109*  134* 85 99     Review of Systems:   Unable to assess due to AMS   Past Medical History:  He,  has a past medical history of Anemia, Elevated PSA, Frequent urination, Glaucoma, History of COVID-19, Hyperglycemia, Hyperlipidemia, Hypertension, and Sleep apnea.   Surgical History:   Past Surgical History:  Procedure Laterality Date   COLONOSCOPY     PROSTATE BIOPSY N/A 11/20/2017   Procedure: BIOPSY TRANSRECTAL ULTRASONIC PROSTATE (TUBP);  Surgeon: Abbie Sons, MD;  Location: [facility] ORS;  Service: Urology;  Laterality: N/A;     Social History:   reports that he has never smoked. He has never used smokeless tobacco. He reports that he does not drink alcohol and does not use drugs.   Family History:  His family history includes Cancer in his father; Colon cancer in his mother; Kidney disease in his mother.   Allergies No Known Allergies   Home Medications  Prior to Admission medications   Medication Sig Start Date End Date Taking? Authorizing Provider  atorvastatin (LIPITOR) 10 MG tablet Take 1 tablet (10 mg total) by mouth daily. In the evening 08/11/20  Yes Tower, Wynelle Fanny, MD  gemfibrozil (LOPID) 600 MG tablet Take 1 tablet (600 mg total) by mouth 2 (two) times daily. 08/11/20  Yes Tower, Wynelle Fanny, MD  lisinopril (ZESTRIL) 5 MG tablet Take 0.5 tablets (2.5 mg total) by mouth daily. 08/11/20  Yes Tower, Wynelle Fanny, MD  metFORMIN (GLUCOPHAGE-XR) 500 MG 24 hr tablet Take 2 tablets (1,000 mg total) by mouth daily with breakfast. 08/11/20  Yes Tower, Wynelle Fanny, MD  acetaminophen (TYLENOL) 500 MG tablet Take 1,000 mg by mouth daily as needed for headache.    [provider]       Ottie Glazier, M.D.  Pulmonary & [place]

## 2021-07-17 NOTE — Progress Notes (Signed)
PROGRESS NOTE   Daniel Bailey.  AVW:[id number]    DOB: Dec 02, 1950    DOA: 07/12/2021  PCP: [name], MD   I have briefly reviewed patients previous medical records in [facility].  Chief Complaint  Patient presents with   Altered Mental Status    Hospital Course:  71 year old male with medical history significant for baseline cognitive impairment, HTN, type II DM, HLD, anemia, glaucoma, sleep apnea, presented to the ED due to altered mental status, vomiting and poor oral intake.  At baseline patient repeats himself, talks, feeds himself, walks, does chores around the house.  Does not drive or shop.  Intermittent cough and congestion.  In the ED, hypotensive with SBP's in the 80s-90s, minimally responsive to 3 L IV saline bolus and maintenance IV fluids, tachypneic up to 28, CT chest positive for left lower lobe pneumonia consistent with aspiration, hyperkalemia/6.2, creatinine 2.83, elevated lactate.  Admitted for aspiration pneumonia, AKI and acute metabolic encephalopathy, all improved.  Unclear etiology regarding periodic episodes of hypotension and hypothermia.  Hemoglobin dropped to 6 g per DL in the absence of overt bleeding, s/p 2 units PRBC transfusion, hemoglobin up to 9.  Follows with outpatient hematology for anemia evaluation.  Pulmonology evaluating OSA and for possible home CPAP.   Assessment & Plan:  Principal Problem:   Sepsis ([place]) Active Problems:   Aspiration pneumonia (HCC)   AKI (acute kidney injury) ([place])   Hyperkalemia   Lactic acidosis   Acute metabolic encephalopathy   Normocytic anemia   Acute respiratory failure with hypoxia (HCC)   Hypothermia   OSA (obstructive sleep apnea)   Assessment and Plan: * Sepsis ([place]) On further review, had severe sepsis on admission due to presence of sepsis along with organ dysfunction including AKI, lactic acidosis and mental status changes. Due to aspiration pneumonia. Met sepsis criteria on admission. Treated  with aggressive IV fluid boluses and maintenance IV fluids and broad-spectrum antibiotics. Having intermittent episodes of hypothermia and hypotension despite patient denying complaints, looking well and nontoxic.  No hypothermic episodes in the last 48 hours.  Occasional mild soft BPs with SBP's in the 90s but asymptomatic. Random cortisol 23.  Blood cultures x2 from admission negative to date. Legal guardian unaware of issues with hypotension or hypothermia.  Aspiration pneumonia ([place]) Not hypoxic. Transitioned from IV Unasyn to Augmentin to complete a total 1 week course. Likely due to mental status changes. SLP evaluation appreciated.  Do not feel that he has oropharyngeal dysphagia.  His aspiration may be related to underlying esophageal pathology.  SLP follow-up appreciated and have recommended mechanical soft diet, thin liquids and PPI. Tolerating trach diet.  Consider outpatient GI evaluation.  AKI (acute kidney injury) ([place]) Presented with creatinine of 2.21. Likely related to GI losses and poor oral intake. Resolved.  Creatinine down to 1.21.  Replace hypokalemia. Now appears volume overloaded due to substantial IV fluids during this hospitalization.  Upper extremity edema noted.  Discontinued IV fluids.  Hyperkalemia Presented with potassium of 6.2. S/p calcium gluconate, Lokelma, albuterol and insulin Treated and resolved. Mildly hypokalemic, replacing.  Lactic acidosis Resolved.  Acute metabolic encephalopathy Multifactorial due to sepsis, AKI and pneumonia. Will need to check with family regarding his baseline and current status. Supportive care. Mental status now back to baseline.  OSA (obstructive sleep apnea) Per discussion with legal guardian, no known history of OSA and was not on CPAP PTA. Pulmonologist on admission and indicated OSA and placed on CPAP Over the  last 2 nights, patient having apneic spells while on CPAP and hence RT changed to  BiPAP. Communicated with pulmonology who indicated that patient will need a outpatient formal PSG and transcutaneous CO2 monitoring but will evaluate patient prior to discharge to see if he will qualify for NIV such as Trilogy Pulmonology follow-up appreciated, getting nocturnal pulse oximetry to try and qualify for CPAP at home  Hypothermia Unclear etiology. Having intermittent episodes of hypothermia although patient himself denies complaints, looks well, not septic or toxic and mentating at his baseline normally. Random cortisol: 23, TSH 3.82. None within the last 48 hours.  Acute respiratory failure with hypoxia (HCC) Acute respiratory failure was present on admission.  Needed brief CPAP followed by BiPAP. PCCM input appreciated.  Indicated that patient has OSA.  Placed on CPAP nightly. Overnight 3/9, patient had a couple of apneic spells while on CPAP and was transitioned to BiPAP by RT. As per guardian, did not know of OSA diagnosis and did not use CPAP prior to admission. Acute respiratory failure resolved Pulmonology evaluating for OSA, possible need for CPAP at discharge, getting nocturnal oxygen saturations tonight.  Normocytic anemia Presented with hemoglobin of 9.8 which has gradually dropped to a low of 7.1 in the absence of overt bleeding. May be multifactorial related to dilutional, acute illness.  Anemia panel unremarkable thus far and suggestive of chronic disease. Patient had a normal-appearing stool on 3/11 but unfortunately FOBT testing was never sent. Hemoglobin dropped to 6 g per DL on 3/11 in the absence of overt bleeding.  S/p 2 units PRBCs and hemoglobin up to 9.2. Has seen hematology on 07/05/2021.  Immunofixation without evidence of monoclonal protein.  Elevated light chains, unclear significance.  LDH normal.  Copper level elevated mildly.  No significant immunophenotypic abnormality detected on flow cytometry panel. Outpatient follow-up with hematology ?  EGD and  colonoscopy as outpatient.  Last colonoscopy in 2013 and chart was normal. Follow CBC in a.m. and if hemoglobin stable and pulmonology have sorted out home CPAP issues, hopeful discharge home 3/13    There is no height or weight on file to calculate BMI.  Nutritional Status        Pressure Ulcer:     DVT prophylaxis: heparin injection 5,000 Units Start: 07/12/21 1400     Code Status: DNR:  Family Communication: None at bedside. Disposition:  Status is: Inpatient Remains inpatient appropriate because: Severity of illness  Pending stability of hemoglobin and pulmonology sorting out OSA/CPAP issues, hopeful discharge home 3/13.    Consultants:   CCM  Procedures:   CPAP.  Antimicrobials:   As above   Subjective:  No complaints reported.  As per nursing note, patient had large dark brown loose and some solid stool on 3/11 night.  Objective:   Vitals:   07/17/21 0441 07/17/21 0444 07/17/21 0559 07/17/21 0802  BP: (!) 88/49 (!) 86/55 (!) 94/57 (!) 90/50  Pulse: (!) 45  (!) 44 (!) 43  Resp: 16  16   Temp: (!) 97.4 F (36.3 C)  (!) 97.5 F (36.4 C) 97.9 F (36.6 C)  TempSrc: Oral     SpO2: 100%  97% 99%    General exam: Elderly male, moderately built and obese lying comfortably propped up in bed.  Does not appear septic or toxic. Respiratory system: Clear to auscultation.  No increased work of breathing. Cardiovascular system: S1 & S2 heard, RRR. No JVD, murmurs, rubs, gallops or clicks. No pedal edema.  Still has some bilateral  upper extremity edema, nonpitting. Gastrointestinal system: Abdomen is nondistended but obese, soft and nontender. No organomegaly or masses felt. Normal bowel sounds heard.  Uncomplicated umbilical hernia. Central nervous system: Alert and oriented x2.  No focal neurological deficits Extremities: Grade 5 x 5 power in all limbs.  No tremors over the last 2 days. Skin: No rashes, lesions or ulcers Psychiatry: Judgement and insight appear  impaired.  Mood & affect pleasant.    Data Reviewed:   I have personally reviewed following labs and imaging studies   CBC: Recent Labs  Lab 07/12/21 0635 07/13/21 0420 07/15/21 0455 07/16/21 0552 07/17/21 0609  WBC 17.1*   < > 5.2 5.3 5.4  NEUTROABS 15.9*  --   --   --   --   HGB 9.8*   < > 7.1* 6.0* 9.2*  HCT 28.8*   < > 21.9* 18.8* 27.1*  MCV 90.9   < > 94.8 95.4 89.4  PLT 284   < > 186 251 194   < > = values in this interval not displayed.    Basic Metabolic Panel: Recent Labs  Lab 07/13/21 0420 07/14/21 0555 07/15/21 0455 07/16/21 0552 07/17/21 0609  NA 139 138 137 139 137  K 4.8 5.0 3.9 4.0 3.4*  CL 109 108 106 106 105  CO2 _0 GLUCOSE 106* 153* 114* 93 96  BUN 34* 24* 26* 21 20  CREATININE 1.82* 1.52* 1.45* 1.34* 1.21  CALCIUM 8.8* 9.0 8.5* 8.5* 8.6*  PHOS  --   --  3.3  --   --     Liver Function Tests: Recent Labs  Lab 07/12/21 0635  AST 62*  ALT 26  ALKPHOS 104  BILITOT 0.7  PROT 7.8  ALBUMIN 3.5    CBG: Recent Labs  Lab 07/16/21 1624 07/17/21 0759 07/17/21 1132  GLUCAP 134* 85 99    Microbiology Studies:   Recent Results (from the past 240 hour(s))  Resp Panel by RT-PCR (Flu A&B, Covid) Nasopharyngeal Swab     Status: None   Collection Time: 07/12/21  6:35 AM   Specimen: Nasopharyngeal Swab; Nasopharyngeal(NP) swabs in vial transport medium  Result Value Ref Range Status   SARS Coronavirus 2 by RT PCR NEGATIVE NEGATIVE Final    Comment: (NOTE) SARS-CoV-2 target nucleic acids are NOT DETECTED.  The SARS-CoV-2 RNA is generally detectable in upper respiratory specimens during the acute phase of infection. The lowest concentration of SARS-CoV-2 viral copies this assay can detect is 138 copies/mL. A negative result does not preclude SARS-Cov-2 infection and should not be used as the sole basis for treatment or other patient management decisions. A negative result may occur with  improper specimen collection/handling,  submission of specimen other than nasopharyngeal swab, presence of viral mutation(s) within the areas targeted by this assay, and inadequate number of viral copies(<138 copies/mL). A negative result must be combined with clinical observations, patient history, and epidemiological information. The expected result is Negative.  Fact Sheet for Patients:  EntrepreneurPulse.com.au  Fact Sheet for Healthcare Providers:  IncredibleEmployment.be  This test is no t yet approved or cleared by the [place] FDA and  has been authorized for detection and/or diagnosis of SARS-CoV-2 by FDA under an Emergency Use Authorization (EUA). This EUA will remain  in effect (meaning this test can be used) for the duration of the COVID-19 declaration under Section 564(b)(1) of the Act, 21 U.S.C.section 360bbb-3(b)(1), unless the authorization is terminated  or revoked sooner.  Influenza A by PCR NEGATIVE NEGATIVE Final   Influenza B by PCR NEGATIVE NEGATIVE Final    Comment: (NOTE) The Xpert Xpress SARS-CoV-2/FLU/RSV plus assay is intended as an aid in the diagnosis of influenza from Nasopharyngeal swab specimens and should not be used as a sole basis for treatment. Nasal washings and aspirates are unacceptable for Xpert Xpress SARS-CoV-2/FLU/RSV testing.  Fact Sheet for Patients: EntrepreneurPulse.com.au  Fact Sheet for Healthcare Providers: IncredibleEmployment.be  This test is not yet approved or cleared by the [place] FDA and has been authorized for detection and/or diagnosis of SARS-CoV-2 by FDA under an Emergency Use Authorization (EUA). This EUA will remain in effect (meaning this test can be used) for the duration of the COVID-19 declaration under Section 564(b)(1) of the Act, 21 U.S.C. section 360bbb-3(b)(1), unless the authorization is terminated or revoked.  Performed at [facility], [address] [place], [place] [address]   Blood culture (routine x 2)     Status: None   Collection Time: 07/12/21  7:52 AM   Specimen: BLOOD  Result Value Ref Range Status   Specimen Description BLOOD LEFT ANTECUBITAL  Final   Special Requests   Final    BOTTLES DRAWN AEROBIC AND ANAEROBIC Blood Culture adequate volume   Culture   Final    NO GROWTH 5 DAYS Performed at [facility], [address] [place], [place] [address]    Report Status 07/17/2021 FINAL  Final  Blood culture (routine x 2)     Status: None   Collection Time: 07/12/21  7:52 AM   Specimen: BLOOD  Result Value Ref Range Status   Specimen Description BLOOD BLOOD LEFT ARM  Final   Special Requests   Final    BOTTLES DRAWN AEROBIC AND ANAEROBIC Blood Culture adequate volume   Culture   Final    NO GROWTH 5 DAYS Performed at [facility], [address], [place], [place] [address]    Report Status 07/17/2021 FINAL  Final    Radiology Studies:  No results found.  Scheduled Meds:    sodium chloride   Intravenous Once   amoxicillin-clavulanate  1 tablet Oral Q12H   atorvastatin  10 mg Oral QHS   gemfibrozil  600 mg Oral BID   heparin  5,000 Units Subcutaneous Q8H   midodrine  5 mg Oral TID WC   pantoprazole  40 mg Oral Daily   polyethylene glycol  17 g Oral Daily    Continuous Infusions:       LOS: 5 days     Vernell Leep, MD,  FACP, [facility], [facility], [facility] (Care Management Physician Certified) [place]  To contact the attending provider between 7A-7P or the covering provider during after hours 7P-7A, please log into the web site [email or web address] and access using universal [place] password for that web site. If you do not have the password, please call the hospital operator.  07/17/2021, 1:17 PM

## 2021-07-17 NOTE — Progress Notes (Signed)
Mobility Specialist - Progress Note ? ? ? 07/17/21 1300  ?Mobility  ?Activity Ambulated with assistance in room;Stood at bedside;Dangled on edge of bed  ?Range of Motion/Exercises All extremities  ?Level of Assistance Standby assist, set-up cues, supervision of patient - no hands on  ?Assistive Device Front wheel walker  ?Distance Ambulated (ft) 30 ft  ?Activity Response Tolerated well  ?$Mobility charge 1 Mobility  ? ? ?During mobility: 44-50 HR,97% SpO2 ? ?Pt was sitting in chair upon arrival using RA. Pt completes BLE Therex exercises ModI + vc for proper technique. Pt completes STS w/ SBA and ambulates to bathroom w/ supervision for BM. Pt ambulates back to chair and is left with RN and needs in reach. ? ?Merrily Brittle ?Mobility Specialist ?07/17/21, 2:04 PM ? ? ? ? ?

## 2021-07-18 LAB — CBC
HCT: 30.2 % — ABNORMAL LOW (ref 39.0–52.0)
Hemoglobin: 10.1 g/dL — ABNORMAL LOW (ref 13.0–17.0)
MCH: 30.4 pg (ref 26.0–34.0)
MCHC: 33.4 g/dL (ref 30.0–36.0)
MCV: 91 fL (ref 80.0–100.0)
Platelets: 237 10*3/uL (ref 150–400)
RBC: 3.32 MIL/uL — ABNORMAL LOW (ref 4.22–5.81)
RDW: 17.9 % — ABNORMAL HIGH (ref 11.5–15.5)
WBC: 5.7 10*3/uL (ref 4.0–10.5)
nRBC: 0 % (ref 0.0–0.2)

## 2021-07-18 LAB — GLUCOSE, CAPILLARY
Glucose-Capillary: 92 mg/dL (ref 70–99)
Glucose-Capillary: 213 mg/dL — ABNORMAL HIGH (ref 70–99)

## 2021-07-18 LAB — BASIC METABOLIC PANEL
Anion gap: 8 (ref 5–15)
BUN: 19 mg/dL (ref 8–23)
CO2: 26 mmol/L (ref 22–32)
Calcium: 8.9 mg/dL (ref 8.9–10.3)
Chloride: 105 mmol/L (ref 98–111)
Creatinine, Ser: 1.11 mg/dL (ref 0.61–1.24)
GFR, Estimated: >60 mL/min (ref >60)
Glucose, Bld: 95 mg/dL (ref 70–99)
Potassium: 3.9 mmol/L (ref 3.5–5.1)
Sodium: 139 mmol/L (ref 135–145)

## 2021-07-18 LAB — MAGNESIUM: Magnesium: 2 mg/dL (ref 1.7–2.4)

## 2021-07-18 MED ORDER — PANTOPRAZOLE SODIUM 40 MG PO TBEC: 40.0000 mg | DELAYED_RELEASE_TABLET | Freq: Every day | ORAL | 1 refills | Status: DC

## 2021-07-18 MED ORDER — MIDODRINE HCL 5 MG PO TABS: 5.0000 mg | ORAL_TABLET | Freq: Three times a day (TID) | ORAL | 0 refills | Status: DC

## 2021-07-18 NOTE — Progress Notes (Signed)
Physical Therapy Treatment ?Patient Details ?Name: Daniel Bailey. ?MRN: [id number] ?DOB: [birth year] ?Today's Date: 07/18/2021 ? ? ?History of Present Illness Patient is a 71 year old male with a PMH (+) for significant for baseline cognitive impairment, HTN, type II DM, HLD, anemia, glaucoma, and sleep apnea. Admitted for sepsis. ? ?  ?PT Comments  ? ? Pt received upright in bed agreeable to PT services. Able to perform bed mobility with supervision and increased time and stand with minguard without AD. Tolerating static standing balance tasks without LOB with min VC's for upright posture to prevent anterior trunk lean with fair carryover. Pt able to ambulate without AD for two bouts of 60' each with seated rest b/t due to LE fatigue from increased energy demand in LE's due to inability to have support from RW. X1 LOB noted with minA from PT to correct with pt utilizing UE on wall and stepping strategy to assist in correct. Without RW, pt performing step to pattern and decreased step lengths with frequent mod to max multimodal cues throughout to improve upright posture due to pt's frequent anterior trunk lean. Pt returned to recliner with safe hand placement with RN present. RN educated pt safest with OOB mobiltiy with RW at this time. D/c recs remain appropriate. Will continue to treat acutely to return pt to safe gait without AD.  ?    ?Recommendations for follow up therapy are one component of a multi-disciplinary discharge planning process, led by the attending physician.  Recommendations may be updated based on patient status, additional functional criteria and insurance authorization. ? ?Follow Up Recommendations ? Home health PT ?  ?  ?Assistance Recommended at Discharge Intermittent Supervision/Assistance  ?Patient can return home with the following Direct supervision/assist for medications management;Direct supervision/assist for financial management;A little help with walking and/or transfers;Assist for  transportation ?  ?Equipment Recommendations ?    ?  ?Recommendations for Other Services   ? ? ?  ?Precautions / Restrictions Precautions ?Precautions: Fall ?Restrictions ?Weight Bearing Restrictions: No  ?  ? ?Mobility ? Bed Mobility ?Overal bed mobility: Needs Assistance ?Bed Mobility: Supine to Sit ?  ?  ?Supine to sit: Supervision, HOB elevated ?  ?  ?General bed mobility comments: Increased time to reach EOB with min VC's for use of UE on bed rail to pull oneself forward for LE's to reach floor. ?Patient Response: Cooperative ? ?Transfers ?Overall transfer level: Needs assistance ?Equipment used: None ?Transfers: Sit to/from Stand ?Sit to Stand: Min guard ?  ?  ?  ?  ?  ?General transfer comment: NO need for RW or counter to attain standing. couple bouts of momentum were required however. ?  ? ?Ambulation/Gait ?Ambulation/Gait assistance: Min guard ?Gait Distance (Feet): 120 Feet ?Assistive device: None ?Gait Pattern/deviations: Step-to pattern, Decreased step length - right, Decreased step length - left, Trunk flexed ?Gait velocity: decreased ?  ?  ?General Gait Details: Progressed gait to AD this session. Pt with step to gait and forward trunk lean requiring mod to max multimodal cues to maintain upright posture to prevent anterior LOB. ? ? ?Stairs ?  ?  ?  ?  ?  ? ? ?Wheelchair Mobility ?  ? ?Modified Rankin (Stroke Patients Only) ?  ? ? ?  ?Balance Overall balance assessment: Needs assistance ?Sitting-balance support: Bilateral upper extremity supported, Feet supported ?Sitting balance-Leahy Scale: Good ?  ?  ?Standing balance support: No upper extremity supported ?Standing balance-Leahy Scale: Fair ?  ?  ?  ?  ?  ?  ?  ?  ?  ?  ?  ?  ?  ? ?  ?  Cognition Arousal/Alertness: Awake/alert ?Behavior During Therapy: [facility] for tasks assessed/performed ?Overall Cognitive Status: History of cognitive impairments - at baseline ?  ?  ?  ?  ?  ?  ?  ?  ?  ?  ?  ?  ?  ?  ?  ?  ?General Comments: able to follow all simple  commands appropriately ?  ?  ? ?  ?Exercises Other Exercises ?Other Exercises: Education on upright posture with gait to prevent anterior LOB and falls. ? ?  ?General Comments General comments (skin integrity, edema, etc.): HR 48 BPM at rest up to 54 BPM post ambulation. ?  ?  ? ?Pertinent Vitals/Pain Pain Assessment ?Pain Assessment: No/denies pain  ? ? ?Home Living   ?  ?  ?  ?  ?  ?  ?  ?  ?  ?   ?  ?Prior Function    ?  ?  ?   ? ?PT Goals (current goals can now be found in the care plan section) Acute Rehab PT Goals ?Patient Stated Goal: to go home ?PT Goal Formulation: With patient ?Time For Goal Achievement: 07/28/21 ?Potential to Achieve Goals: Good ?Progress towards PT goals: Progressing toward goals ? ?  ?Frequency ? ? ? Min 2X/week ? ? ? ?  ?PT Plan Current plan remains appropriate  ? ? ?Co-evaluation   ?  ?  ?  ?  ? ?  ?AM-PAC PT "6 Clicks" Mobility   ?Outcome Measure ? Help needed turning from your back to your side while in a flat bed without using bedrails?: A Little ?Help needed moving from lying on your back to sitting on the side of a flat bed without using bedrails?: A Little ?Help needed moving to and from a bed to a chair (including a wheelchair)?: A Little ?Help needed standing up from a chair using your arms (e.g., wheelchair or bedside chair)?: A Little ?Help needed to walk in hospital room?: A Little ?Help needed climbing 3-5 steps with a railing? : A Lot ?6 Click Score: 17 ? ?  ?End of Session Equipment Utilized During Treatment: Gait belt ?Activity Tolerance: Patient tolerated treatment well ?Patient left: in chair;with call bell/phone within reach;with nursing/sitter in room ?Nurse Communication: Mobility status ?PT Visit Diagnosis: Unsteadiness on feet (R26.81);Muscle weakness (generalized) (M62.81);Difficulty in walking, not elsewhere classified (R26.2) ?  ? ? ?Time: 7026-3785 ?PT Time Calculation (min) (ACUTE ONLY): 18 min ? ?Charges:  $Gait Training: 8-22 mins          ?           ? ?Salem Caster. Fairly IV, PT, DPT ?Physical Therapist- [place]  ?[facility]  ?07/18/2021, 10:56 AM ? ?

## 2021-07-18 NOTE — TOC Transition Note (Signed)
Transition of Care (TOC) - CM/SW Discharge Note ? ? ?Patient Details  ?Name: Daniel Bailey. ?MRN: [id number] ?Date of Birth: 12/06/1950 ? ?Transition of Care (TOC) CM/SW Contact:  ?Candie Chroman, LCSW ?Phone Number: ?07/18/2021, 3:35 PM ? ? ?Clinical Narrative:  Patient has orders to discharge home today. [facility] representative is aware. PT no longer recommending DME. No further concerns. CSW signing off. ? ?Final next level of care: [place] ?Barriers to Discharge: Barriers Resolved ? ? ?Patient Goals and CMS Choice ?  ?CMS Medicare.gov Compare Post Acute Care list provided to:: Legal Guardian (Reviewed over the phone) ?Choice offered to / list presented to : [facility] POA / Guardian ? ?Discharge Placement ?  ?           ?  ?Patient to be transferred to facility by: Cousin/Guardian ?Name of family member notified: [name] ?Patient and family notified of of transfer: 07/18/21 ? ?Discharge Plan and Services ?  ?  ?Post Acute Care Choice: Home Health          ?  ?  ?  ?  ?  ?HH Arranged: PT, OT ?[place] Agency: [place] ?Date HH Agency Contacted: 07/18/21 ?  ?Representative spoke with at [employer]: Bobbe Medico ? ?Social Determinants of Health (SDOH) Interventions ?  ? ? ?Readmission Risk Interventions ?No flowsheet data found. ? ? ? ? ?

## 2021-07-18 NOTE — Discharge Instructions (Signed)

## 2021-07-18 NOTE — Care Management Important Message (Signed)
Important Message ? ?Patient Details  ?Name: Daniel Bailey. ?MRN: [id number] ?Date of Birth: 01-Feb-1951 ? ? ?Medicare Important Message Given:  Yes ? ? ?Reviewed Medicare IM with [name], legal guardian, at [phone].  Copy of Medicare IM left in patient's room on windowsill for reference.   ? ?Dannette Barbara ?07/18/2021, 12:45 PM ?

## 2021-07-18 NOTE — Consult Note (Signed)
NAME:  Torren Maffeo., MRN:  [id number], DOB:  1951/02/09, LOS: 6 ADMISSION DATE:  07/12/2021, CONSULTATION DATE:  07/12/2021 REFERRING MD:  Dr. Ouida Sills, CHIEF COMPLAINT:  Altered Mental Status   Brief Pt Description / Synopsis:  71 y.o. Male admitted with Acute Metabolic Encephalopathy in the setting of sepsis due to suspected Aspiration Pneumonia and Viral Gastroenteritis, along with AKI and Hyperkalemia.  History of Present Illness:  Eunice Oldaker is a 71 year old male with a past medical history significant for baseline cognitive impairment, hypertension, diabetes mellitus type 2, hyperlipidemia, anemia, glaucoma, and obstructive sleep apnea who presented to [facility] ED on 07/12/2021 due to complaints of altered mental status and projectile vomiting.  Patient is currently altered, therefore history is obtained from the patient's cousin at bedside along with chart review.  Per family's report on Sunday he became confused, which progressively got worse on Monday, with associated poor p.o. intake, and inability to complete ADLs.  Earlier this morning he had an episode of projectile vomiting which prompted family to seek medical attention.  She also reports intermittent cough and congestion.  She denies any knowledge of chest pain, palpitations, fever or chills, abdominal pain, dysuria.  Of note his cousin reports he had an episode of confusion in January which was worked up by his PCP.  Found only to have anemia which he was referred to hematology/oncology at that time.  07/17/21- patient seen and examined at bedside. He is in good spirits feels well rested reports sleep was good. He does have cognitive impairment and noted apneic episodes while asleep. He would benefit from proper sleep apnea evaluation in lab. This can be done on outpatient.  He can have nocturnal pulse oximetry done for qualification.  I have discussed with [place] and we will try to order CPAP if possible for home use.   07/18/21-  patient will need official in lab PSG with trancutaneous CO2 monitoring for central sleep apnea evaluation and with his cognitive impairment there will be prolonged acclimation with need for repeated educational sessions so patient does not take off interface, has proper seal with no leakage and adequate residual AHI.   Pertinent  Medical History  Cognitive impairment (developmental delay) Hypertension Diabetes mellitus Hyperlipidemia Anemia Glaucoma Obstructive sleep apnea  Micro Data:  07/12/2021: SARS-CoV-2 and influenza PCR>> negative 07/12/2021: Blood culture x2>> 07/12/2021: Sputum>> 07/12/2021: Strep pneumo urinary antigen>>negative 07/12/2021: Legionella urinary antigen>>  Antimicrobials:  Azithromycin x1 dose 3/7 Ceftriaxone 3/7 x 1 dose Unasyn 3/7>>  Significant Hospital Events: Including procedures, antibiotic start and stop dates in addition to other pertinent events   07/12/2021: Presented to ED with altered mental status and poor p.o. intake.  Hospitalist admitting.  PCCM consulted due to soft blood pressure and high risk for decompensation.  CODE STATUS changed to DNR/DNI.    Objective   Blood pressure (!) 114/59, pulse (!) 45, temperature (!) 97.4 F (36.3 C), resp. rate 16, SpO2 98 %.        Intake/Output Summary (Last 24 hours) at 07/18/2021 1625 Last data filed at 07/18/2021 0515 Gross per 24 hour  Intake 240 ml  Output 500 ml  Net -260 ml    There were no vitals filed for this visit.  Examination: General: NAD sitting in bed, on nasal cannula, HENT: Atraumatic, normocephalic, neck supple, no JVD Lungs: Diminished breath sounds bilaterally, even, nonlabored, normal effort Cardiovascular: Regular rate and rhythm, S1-S2, no murmurs, rubs, gallops Abdomen: Soft, nontender, nondistended, no guarding rebound tenderness Extremities: No deformities,  no edema, no cyanosis or clubbing Neuro: baseline cog impairment no FND noted GU: Diaper in place,  incontinent  Resolved Hospital Problem list     Assessment & Plan:   Sepsis due to suspected Aspiration Pneumonia and Viral Gastroenteritis-RESOLVED  Meets SIRS Criteria: RR 22, WBC 17.1, Lactic acid 3.2 -Monitor fever curve -Trend WBC's & Procalcitonin -Follow cultures as above -Continue empiric Unasyn pending cultures & sensitivities   Sleep apnea - central vs obstructive       -  PSG on outpatient        -s/p nocturnal pulse oximetry     Need to have transcutaneous co2 monitoring with in lab PSG  Relative Hypotension (SBP 90's) PMHx: Hypertension -Continuous cardiac monitoring -Maintain MAP >65 -IV fluids ~ NS @ 150 cc/hr (received 3.5 L IVF resuscitation in ED) -Vasopressors as needed to maintain MAP goal -Continue Midodrine -Lactic acid has normalized (3.2 ~ 2.7 ~ 1.2) -HS Troponin negative x2 -Consider Echocardiogram   Acute Kidney Injury  Hyperkalemia ~ IMPROVED -Monitor I&O's / urinary output -Follow BMP -Ensure adequate renal perfusion -Avoid nephrotoxic agents as able -Replace electrolytes as indicated -IV Fluids -Received Calcium Gluconate, insulin + D50, and Lokelma ~ repeat BMP @ 1600 on 3/7 ~ K+ improved to 5.0 from 6.2  Acute Metabolic Encephalopathy in setting of Sepsis and multiple metabolic derangements -Treat sepsis and metabolic derangements as above -Provide supportive care -Promote normal sleep/wake cycle, promote family presence -Avoid sedating meds as able -CT Head negative   Aspiration pneumonia without hypoxia currently -Supplemental O2 as needed to maintain O2 sats greater than 92% -CPAP nightly and anytime while sleeping -Follow intermittent chest x-ray and ABG as needed   Best Practice (right click and "Reselect all SmartList Selections" daily)   Diet/type: NPO, until mental status improved DVT prophylaxis: prophylactic heparin  GI prophylaxis: N/A Lines: N/A Foley:  N/A Code Status:  DNR/DNI Last date of multidisciplinary  goals of care discussion [N/A]  Updated pt's cousin [name] at bedside.  All questions answered.  Labs   CBC: Recent Labs  Lab 07/12/21 0635 07/13/21 0420 07/14/21 0555 07/15/21 0455 07/16/21 0552 07/17/21 0609 07/18/21 0338  WBC 17.1*   < > 5.6 5.2 5.3 5.4 5.7  NEUTROABS 15.9*  --   --   --   --   --   --   HGB 9.8*   < > 7.4* 7.1* 6.0* 9.2* 10.1*  HCT 28.8*   < > 22.9* 21.9* 18.8* 27.1* 30.2*  MCV 90.9   < > 95.0 94.8 95.4 89.4 91.0  PLT 284   < > 177 186 251 194 237   < > = values in this interval not displayed.     Basic Metabolic Panel: Recent Labs  Lab 07/14/21 0555 07/15/21 0455 07/16/21 0552 07/17/21 0609 07/18/21 0338  NA 138 137 139 137 139  K 5.0 3.9 4.0 3.4* 3.9  CL 108 106 106 105 105  CO2 '23 25 25 26 26  '$ GLUCOSE 153* 114* 93 96 95  BUN 24* 26* '21 20 19  '$ CREATININE 1.52* 1.45* 1.34* 1.21 1.11  CALCIUM 9.0 8.5* 8.5* 8.6* 8.9  MG  --   --   --   --  2.0  PHOS  --  3.3  --   --   --     GFR: Estimated Creatinine Clearance: 64.4 mL/min (by C-G formula based on SCr of 1.11 mg/dL). Recent Labs  Lab 07/12/21 0752 07/12/21 4166 07/12/21 0957 07/12/21 1500 07/13/21 0420  07/14/21 0555 07/15/21 0455 07/16/21 0552 07/17/21 0609 07/18/21 0338  PROCALCITON  --  2.35  --   --  1.29 0.52  --   --   --   --   WBC  --   --   --   --  9.3 5.6 5.2 5.3 5.4 5.7  LATICACIDVEN 3.2*  --  2.7* 1.2  --   --   --   --   --   --      Liver Function Tests: Recent Labs  Lab 07/12/21 0635  AST 62*  ALT 26  ALKPHOS 104  BILITOT 0.7  PROT 7.8  ALBUMIN 3.5    Recent Labs  Lab 07/12/21 0635  LIPASE 53*    Recent Labs  Lab 07/12/21 0635  AMMONIA 32     ABG No results found for: PHART, PCO2ART, PO2ART, HCO3, TCO2, ACIDBASEDEF, O2SAT   Coagulation Profile: Recent Labs  Lab 07/12/21 0635  INR 1.0     Cardiac Enzymes: No results for input(s): CKTOTAL, CKMB, CKMBINDEX, TROPONINI in the last 168 hours.  HbA1C: Hgb A1c MFr Bld  Date/Time Value  Ref Range Status  05/17/2021 03:38 PM 6.4 4.6 - 6.5 % Final    Comment:    Glycemic Control Guidelines for People with Diabetes:Non Diabetic:  <6%Goal of Therapy: <7%Additional Action Suggested:  >8%   02/09/2021 10:07 AM 6.4 4.6 - 6.5 % Final    Comment:    Glycemic Control Guidelines for People with Diabetes:Non Diabetic:  <6%Goal of Therapy: <7%Additional Action Suggested:  >8%     CBG: Recent Labs  Lab 07/17/21 0759 07/17/21 1132 07/17/21 1605 07/18/21 0738 07/18/21 1216  GLUCAP 85 99 105* 92 213*     Review of Systems:   Unable to assess due to AMS   Past Medical History:  He,  has a past medical history of Anemia, Elevated PSA, Frequent urination, Glaucoma, History of COVID-19, Hyperglycemia, Hyperlipidemia, Hypertension, and Sleep apnea.   Surgical History:   Past Surgical History:  Procedure Laterality Date   COLONOSCOPY     PROSTATE BIOPSY N/A 11/20/2017   Procedure: BIOPSY TRANSRECTAL ULTRASONIC PROSTATE (TUBP);  Surgeon: Abbie Sons, MD;  Location: [facility] ORS;  Service: Urology;  Laterality: N/A;     Social History:   reports that he has never smoked. He has never used smokeless tobacco. He reports that he does not drink alcohol and does not use drugs.   Family History:  His family history includes Cancer in his father; Colon cancer in his mother; Kidney disease in his mother.   Allergies No Known Allergies   Home Medications  Prior to Admission medications   Medication Sig Start Date End Date Taking? Authorizing Provider  atorvastatin (LIPITOR) 10 MG tablet Take 1 tablet (10 mg total) by mouth daily. In the evening 08/11/20  Yes Tower, Wynelle Fanny, MD  gemfibrozil (LOPID) 600 MG tablet Take 1 tablet (600 mg total) by mouth 2 (two) times daily. 08/11/20  Yes Tower, Wynelle Fanny, MD  lisinopril (ZESTRIL) 5 MG tablet Take 0.5 tablets (2.5 mg total) by mouth daily. 08/11/20  Yes Tower, Wynelle Fanny, MD  metFORMIN (GLUCOPHAGE-XR) 500 MG 24 hr tablet Take 2 tablets (1,000 mg  total) by mouth daily with breakfast. 08/11/20  Yes Tower, Wynelle Fanny, MD  acetaminophen (TYLENOL) 500 MG tablet Take 1,000 mg by mouth daily as needed for headache.    [provider]       Ottie Glazier, M.D.  Pulmonary &  [place]

## 2021-07-18 NOTE — Discharge Summary (Signed)
Physician Discharge Summary  Daniel Bailey. OJJ:[id number] DOB: 1951/02/25  PCP: [name], MD  Admitted from: Home Discharged to: Home  Admit date: 07/12/2021 Discharge date: 07/18/2021  Recommendations for Outpatient Follow-up:    Follow-up Information     Tower, Wynelle Fanny, MD. Schedule an appointment as soon as possible for a visit in 1 week(s).   Specialties: Family Medicine, Radiology Why: To be seen with repeat labs (CBC & BMP).  Consider outpatient gastroenterology consultation for evaluation of anemia of undetermined cause and esophageal abnormality on imaging. Contact information: [place] [address] [phone]         Earlie Server, MD. Schedule an appointment as soon as possible for a visit in 1 week(s).   Specialty: Oncology Contact information: [place] [address] [phone]         Ottie Glazier, MD. Go to.   Specialty: Pulmonary Disease Why: Follow-up regarding evaluation and management of suspected sleep apnea. pt will need to call and make appointment to update chart Contact information: [place] [address] [place] Orders (From admission, onward)     Start     Ordered   07/18/21 [place]  At discharge       Question Answer Comment  To provide the following care/treatments PT   To provide the following care/treatments OT      07/18/21 1507             Equipment/Devices: None    Discharge Condition: Improved and stable.   Code Status: DNR Diet recommendation:  Discharge Diet Orders (From admission, onward)     Start     Ordered   07/18/21 0000  Diet - low sodium heart healthy       Comments: DIET DYS 3; Fluid consistency: Thin.   Comments: NO STRAWS!!!  Extra gravy on CHOPPED meats. CREAM   07/18/21 1456   07/18/21 0000  Diet Carb Modified        07/18/21 1456             Discharge  Diagnoses:  Principal Problem:   Sepsis ([place]) Active Problems:   Aspiration pneumonia ([place])   AKI (acute kidney injury) ([place])   Hyperkalemia   Lactic acidosis   Acute metabolic encephalopathy   Normocytic anemia   Acute respiratory failure with hypoxia (HCC)   Hypothermia   OSA (obstructive sleep apnea)   Brief Summary: 71 year old male with medical history significant for baseline cognitive impairment, HTN, type II DM, HLD, anemia, glaucoma, sleep apnea, presented to the ED due to altered mental status, vomiting and poor oral intake.  At baseline patient repeats himself, talks, feeds himself, walks, does chores around the house.  Does not drive or shop.  Intermittent cough and congestion.  In the ED, hypotensive with SBP's in the 80s-90s, minimally responsive to 3 L IV saline bolus and maintenance IV fluids, tachypneic up to 28, CT chest positive for left lower lobe pneumonia consistent with aspiration, hyperkalemia/6.2, creatinine 2.83, elevated lactate.  Admitted for aspiration pneumonia, AKI and acute metabolic encephalopathy, all improved.  Unclear etiology regarding periodic episodes of hypotension and hypothermia.  Hemoglobin dropped to 6 g per DL in the absence of overt bleeding, s/p 2 units PRBC transfusion, hemoglobin up to 9 >10.2.  Follows with outpatient hematology for anemia evaluation.  Overnight oxygen saturations close to 100% without any desaturations documented.  Pulmonology recommends no CPAP at discharge and will follow-up in the office to arrange further evaluation including formal PSG.  Assessment and Plan: * Sepsis ([place]) On further review, had severe sepsis on admission due to presence of sepsis along with organ dysfunction including AKI, lactic acidosis and mental status changes. Due to aspiration pneumonia. Met sepsis criteria on admission. Treated with aggressive IV fluid boluses and maintenance IV fluids and broad-spectrum antibiotics. Had intermittent episodes of  hypothermia and hypotension despite patient denying complaints, looking well and nontoxic.  No hypothermic episodes in the last 72 hours.  Occasional mild soft BPs with SBP's in the 90s but asymptomatic. Random cortisol 23.  Blood cultures x2 from admission negative to date. Legal guardian unaware of issues with hypotension or hypothermia.  Aspiration pneumonia ([place]) Not hypoxic. Transitioned from IV Unasyn to Augmentin and has completed approximately 7 days course.  No further antibiotics at discharge. Likely due to mental status changes. SLP evaluation appreciated.  Do not feel that he has oropharyngeal dysphagia.  His aspiration may be related to underlying esophageal pathology.  SLP follow-up appreciated and have recommended mechanical soft diet, thin liquids and PPI. Tolerating above diet.  Consider outpatient GI evaluation. Recommend repeating chest x-ray in 4 weeks to ensure resolution of pneumonia findings  AKI (acute kidney injury) ([place]) Presented with creatinine of 2.21. Likely related to GI losses, poor oral intake and lisinopril. Resolved.  Creatinine down to 1.11.  Replaced hypokalemia. Now appears volume overloaded due to substantial IV fluids during this hospitalization.  Upper extremity edema slowly improving. Discontinued lisinopril especially given episodic soft blood pressures and on midodrine. Reassess during close outpatient follow-up.  Hyperkalemia Presented with potassium of 6.2. S/p calcium gluconate, Lokelma, albuterol and insulin Treated and resolved. Potassium normal on day of discharge.  Lactic acidosis Resolved.  Acute metabolic encephalopathy Multifactorial due to sepsis, AKI and pneumonia. Will need to check with family regarding his baseline and current status. Supportive care. Mental status now back to baseline.  OSA (obstructive sleep apnea) Per discussion with legal guardian, no known history of OSA and was not on CPAP PTA. Pulmonologist on  admission and indicated OSA and placed on CPAP Over the last 2 nights, patient having apneic spells while on CPAP and hence RT changed to BiPAP. Communicated with pulmonology who indicated that patient will need a outpatient formal PSG and transcutaneous CO2 monitoring but will evaluate patient prior to discharge to see if he will qualify for NIV such as [facility] Pulmonology follow-up appreciated, getting nocturnal pulse oximetry overnight prior to admission and patient did not have hypoxia.  I communicated with pulmonology who recommended discharging him on no CPAP and will follow-up in office for formal evaluation of OSA and management.  Hypothermia Unclear etiology. Having intermittent episodes of hypothermia although patient himself denies complaints, looks well, not septic or toxic and mentating at his baseline normally. Random cortisol: 23, TSH 3.82. None within the last 72 hours.  Acute respiratory failure with hypoxia (HCC) Acute respiratory failure was present on admission.  Needed brief CPAP followed by BiPAP. PCCM input appreciated.  Indicated that patient has OSA.  Placed on CPAP nightly. Overnight 3/9, patient had a couple of apneic spells while on CPAP and was transitioned to BiPAP by RT. As per guardian, did not know of OSA diagnosis and did not use CPAP prior to admission. Acute respiratory failure resolved Pulmonology evaluating for OSA, possible need for CPAP at discharge,  check nocturnal oxygen saturations overnight prior to discharge was not hypoxic and was saturating in the high 90s up to 100% on room air.   Normocytic anemia Presented with hemoglobin of 9.8 which has gradually dropped to a low of 7.1 in the absence of overt bleeding. May be multifactorial related to dilutional, acute illness.  Anemia panel unremarkable thus far and suggestive of chronic disease. Patient had a normal-appearing stool on 3/11 but unfortunately FOBT testing was never sent. Hemoglobin dropped  to 6 g per DL on 3/11 in the absence of overt bleeding.  S/p 2 units PRBCs and hemoglobin up to 9.2. Has seen hematology on 07/05/2021.  Immunofixation without evidence of monoclonal protein.  Elevated light chains, unclear significance.  LDH normal.  Copper level elevated mildly.  No significant immunophenotypic abnormality detected on flow cytometry panel. Outpatient follow-up with hematology ?  EGD and colonoscopy as outpatient.  Last colonoscopy in 2013 and chart was normal. Hemoglobin improved to 10.1.  Although no melena, FOBT positive. Recommend close follow-up of CBC as outpatient by PCP/hematology. Recommend GI consultation for consideration for EGD and colonoscopy as outpatient.  Hypertension/hypotension Patient also on midodrine for episodic hypotension noted in the hospital.  Lisinopril discontinued.  Reassess during close outpatient follow-up if midodrine can be weaned off.  There is no height or weight on file to calculate BMI.   Consultations: PCCM  Procedures: None   Discharge Instructions  Discharge Instructions     Call MD for:   Complete by: As directed    Recurrent confusion or mental status changes.   Call MD for:  difficulty breathing, headache or visual disturbances   Complete by: As directed    Call MD for:  extreme fatigue   Complete by: As directed    Call MD for:  persistant dizziness or light-headedness   Complete by: As directed    Call MD for:  persistant nausea and vomiting   Complete by: As directed    Call MD for:  severe uncontrolled pain   Complete by: As directed    Call MD for:  temperature >100.4   Complete by: As directed    Diet - low sodium heart healthy   Complete by: As directed    DIET DYS 3; Fluid consistency: Thin.   Comments: NO STRAWS!!!  Extra gravy on CHOPPED meats. CREAM   Diet Carb Modified   Complete by: As directed    Increase activity slowly   Complete by: As directed         Medication List     STOP taking  these medications    lisinopril 5 MG tablet Commonly known as: ZESTRIL       TAKE these medications    acetaminophen 500 MG tablet Commonly known as: TYLENOL Take 1,000 mg by mouth daily as needed for headache.   atorvastatin 10 MG tablet Commonly known as: LIPITOR Take 1 tablet (10 mg total) by mouth daily. In the evening   gemfibrozil 600 MG tablet Commonly known as: LOPID Take 1 tablet (600 mg total) by mouth 2 (two) times daily.   metFORMIN 500 MG 24 hr tablet Commonly known as: GLUCOPHAGE-XR Take 2 tablets (1,000 mg total) by mouth daily with breakfast.   midodrine 5 MG tablet Commonly known as: PROAMATINE Take 1 tablet (5 mg total) by mouth 3 (three) times daily with meals.   pantoprazole 40 MG tablet Commonly known as: PROTONIX Take 1 tablet (40 mg total) by mouth daily. Start taking on: July 19, 2021       No Known Allergies    Procedures/Studies: CT HEAD WO CONTRAST (5MM)  Result Date: 07/12/2021 CLINICAL DATA:  Projectile vomiting. Mental status change with unknown cause. EXAM: CT HEAD WITHOUT CONTRAST TECHNIQUE: Contiguous axial images were obtained from the base of the skull through the vertex without intravenous contrast. RADIATION DOSE REDUCTION: This exam was performed according to the departmental dose-optimization program which includes automated exposure control, adjustment of the mA and/or kV according to patient size and/or use of iterative reconstruction technique. COMPARISON:  06/06/2021 FINDINGS: Brain: No evidence of acute infarction, hemorrhage, hydrocephalus, extra-axial collection or mass lesion/mass effect. Vascular: No hyperdense vessel or unexpected calcification. Skull: Chronic right parietal bone thinning. Sinuses/Orbits: No acute finding. Other: Intermittent motion artifact. IMPRESSION: Negative motion degraded head CT. Electronically Signed   By: Jorje Guild M.D.   On: 07/12/2021 07:44   DG Chest Portable 1 View  Result Date:  07/12/2021 CLINICAL DATA:  Chest pain EXAM: PORTABLE CHEST 1 VIEW COMPARISON:  05/31/2021 FINDINGS: Retrocardiac infiltrate. No edema, effusion, or pneumothorax. Normal heart size and mediastinal contours. IMPRESSION: Retrocardiac infiltrate. Electronically Signed   By: Jorje Guild M.D.   On: 07/12/2021 06:59   CT CHEST ABDOMEN PELVIS WO CONTRAST  Result Date: 07/12/2021 CLINICAL DATA:  Respiratory illness with nondiagnostic x-ray EXAM: CT CHEST, ABDOMEN AND PELVIS WITHOUT CONTRAST TECHNIQUE: Multidetector CT imaging of the chest, abdomen and pelvis was performed following the standard protocol without IV contrast. RADIATION DOSE REDUCTION: This exam was performed according to the departmental dose-optimization program which includes automated exposure control, adjustment of the mA and/or kV according to patient size and/or use of iterative reconstruction technique. COMPARISON:  Chest x-ray earlier today FINDINGS: CT CHEST FINDINGS Cardiovascular: Normal heart size. Trace pericardial fluid and/or thickening. No acute vascular finding Mediastinum/Nodes: Diffuse fluid distended esophagus. Negative for adenopathy or mass. Lungs/Pleura: Left lower lobe infiltrate and associated airway opacification. Musculoskeletal: Motion artifact and diffuse thoracic degeneration. No acute finding. CT ABDOMEN PELVIS FINDINGS Hepatobiliary: Cystic densities in the central and left lobe liver.Cholelithiasis without signs of acute cholecystitis. Pancreas: Generalized atrophy Spleen: Unremarkable. Adrenals/Urinary Tract: Negative adrenals. No hydronephrosis or stone. Moderate distension of the bladder without focal abnormality. Stomach/Bowel: Fluid and gas distended stomach with reflux into the lower esophagus. Small bowel containing right groin hernia without obstructive changes. No bowel wall thickening. Vascular/Lymphatic: No acute vascular abnormality. Atheromatous calcification of the aorta. No mass or adenopathy.  Reproductive:No pathologic findings. Other: No ascites or pneumoperitoneum. Right groin hernia as noted above. Fatty umbilical hernia. Musculoskeletal: Generalized spinal degeneration with dextroscoliosis. No acute finding. IMPRESSION: 1. Left lower lobe pneumonia, suspect underlying aspiration given fluid distended esophagus and left lower lobe airway opacification. 2. Gas and fluid distended stomach. 3. Fatty umbilical and small bowel containing right groin hernias. No small bowel obstruction. 4. Cholelithiasis. 5. Motion artifact. Electronically Signed   By: Jorje Guild M.D.   On: 07/12/2021 08:04      Subjective: Patient denies complaints.  No chest pain or dyspnea.  As per RN, no acute issues noted.  Discharge Exam:  Vitals:   07/17/21 1640 07/17/21 2039 07/18/21 0454 07/18/21 0739  BP: (!) 114/57 117/60 116/73 (!) 114/59  Pulse: (!) 41 (!) 40 (!) 44 (!) 45  Resp:  _0 Temp: 97.8 F (36.6 C) 97.6 F (36.4 C) (!) 97.4 F (36.3 C) (!) 97.4 F (36.3 C)  TempSrc:   Oral   SpO2: 100% 100% 100% 98%  General exam: Elderly male, moderately built and obese lying comfortably propped up in bed.  Does not appear septic or toxic. Respiratory system: Clear to auscultation.  No increased work of breathing. Cardiovascular system: S1 & S2 heard, RRR. No JVD, murmurs, rubs, gallops or clicks. No pedal edema.  Still has some bilateral upper extremity edema, nonpitting but improving. Gastrointestinal system: Abdomen is nondistended but obese, soft and nontender. No organomegaly or masses felt. Normal bowel sounds heard.  Uncomplicated umbilical hernia. Central nervous system: Alert and oriented x2.  No focal neurological deficits Extremities: Grade 5 x 5 power in all limbs.  No tremors over the last 2 days. Skin: No rashes, lesions or ulcers Psychiatry: Judgement and insight appear impaired.  Mood & affect pleasant.    The results of significant diagnostics from this hospitalization  (including imaging, microbiology, ancillary and laboratory) are listed below for reference.     Microbiology: Recent Results (from the past 240 hour(s))  Resp Panel by RT-PCR (Flu A&B, Covid) Nasopharyngeal Swab     Status: None   Collection Time: 07/12/21  6:35 AM   Specimen: Nasopharyngeal Swab; Nasopharyngeal(NP) swabs in vial transport medium  Result Value Ref Range Status   SARS Coronavirus 2 by RT PCR NEGATIVE NEGATIVE Final    Comment: (NOTE) SARS-CoV-2 target nucleic acids are NOT DETECTED.  The SARS-CoV-2 RNA is generally detectable in upper respiratory specimens during the acute phase of infection. The lowest concentration of SARS-CoV-2 viral copies this assay can detect is 138 copies/mL. A negative result does not preclude SARS-Cov-2 infection and should not be used as the sole basis for treatment or other patient management decisions. A negative result may occur with  improper specimen collection/handling, submission of specimen other than nasopharyngeal swab, presence of viral mutation(s) within the areas targeted by this assay, and inadequate number of viral copies(<138 copies/mL). A negative result must be combined with clinical observations, patient history, and epidemiological information. The expected result is Negative.  Fact Sheet for Patients:  EntrepreneurPulse.com.au  Fact Sheet for Healthcare Providers:  IncredibleEmployment.be  This test is no t yet approved or cleared by the [place] FDA and  has been authorized for detection and/or diagnosis of SARS-CoV-2 by FDA under an Emergency Use Authorization (EUA). This EUA will remain  in effect (meaning this test can be used) for the duration of the COVID-19 declaration under Section 564(b)(1) of the Act, 21 U.S.C.section 360bbb-3(b)(1), unless the authorization is terminated  or revoked sooner.       Influenza A by PCR NEGATIVE NEGATIVE Final   Influenza B by PCR  NEGATIVE NEGATIVE Final    Comment: (NOTE) The Xpert Xpress SARS-CoV-2/FLU/RSV plus assay is intended as an aid in the diagnosis of influenza from Nasopharyngeal swab specimens and should not be used as a sole basis for treatment. Nasal washings and aspirates are unacceptable for Xpert Xpress SARS-CoV-2/FLU/RSV testing.  Fact Sheet for Patients: EntrepreneurPulse.com.au  Fact Sheet for Healthcare Providers: IncredibleEmployment.be  This test is not yet approved or cleared by the [place] FDA and has been authorized for detection and/or diagnosis of SARS-CoV-2 by FDA under an Emergency Use Authorization (EUA). This EUA will remain in effect (meaning this test can be used) for the duration of the COVID-19 declaration under Section 564(b)(1) of the Act, 21 U.S.C. section 360bbb-3(b)(1), unless the authorization is terminated or revoked.  Performed at [facility], [address] [place], [place] [address]   Blood culture (routine x 2)     Status:  None   Collection Time: 07/12/21  7:52 AM   Specimen: BLOOD  Result Value Ref Range Status   Specimen Description BLOOD LEFT ANTECUBITAL  Final   Special Requests   Final    BOTTLES DRAWN AEROBIC AND ANAEROBIC Blood Culture adequate volume   Culture   Final    NO GROWTH 5 DAYS Performed at [facility], [address] [place], [place] [address]    Report Status 07/17/2021 FINAL  Final  Blood culture (routine x 2)     Status: None   Collection Time: 07/12/21  7:52 AM   Specimen: BLOOD  Result Value Ref Range Status   Specimen Description BLOOD BLOOD LEFT ARM  Final   Special Requests   Final    BOTTLES DRAWN AEROBIC AND ANAEROBIC Blood Culture adequate volume   Culture   Final    NO GROWTH 5 DAYS Performed at [facility], [address] [place], [place] [address]    Report Status 07/17/2021 FINAL  Final     Labs: CBC: Recent Labs  Lab 07/12/21 0635  07/13/21 0420 07/14/21 0555 07/15/21 0455 07/16/21 0552 07/17/21 0609 07/18/21 0338  WBC 17.1*   < > 5.6 5.2 5.3 5.4 5.7  NEUTROABS 15.9*  --   --   --   --   --   --   HGB 9.8*   < > 7.4* 7.1* 6.0* 9.2* 10.1*  HCT 28.8*   < > 22.9* 21.9* 18.8* 27.1* 30.2*  MCV 90.9   < > 95.0 94.8 95.4 89.4 91.0  PLT 284   < > 177 186 251 194 237   < > = values in this interval not displayed.    Basic Metabolic Panel: Recent Labs  Lab 07/14/21 0555 07/15/21 0455 07/16/21 0552 07/17/21 0609 07/18/21 0338  NA 138 137 139 137 139  K 5.0 3.9 4.0 3.4* 3.9  CL 108 106 106 105 105  CO2 _0 GLUCOSE 153* 114* 93 96 95  BUN 24* 26* _1 CREATININE 1.52* 1.45* 1.34* 1.21 1.11  CALCIUM 9.0 8.5* 8.5* 8.6* 8.9  MG  --   --   --   --  2.0  PHOS  --  3.3  --   --   --     Liver Function Tests: Recent Labs  Lab 07/12/21 0635  AST 62*  ALT 26  ALKPHOS 104  BILITOT 0.7  PROT 7.8  ALBUMIN 3.5    CBG: Recent Labs  Lab 07/17/21 0759 07/17/21 1132 07/17/21 1605 07/18/21 0738 07/18/21 1216  GLUCAP 85 99 105* 92 213*    Urinalysis    Component Value Date/Time   COLORURINE YELLOW (A) 07/12/2021 0957   APPEARANCEUR CLEAR (A) 07/12/2021 0957   LABSPEC 1.011 07/12/2021 0957   PHURINE 6.0 07/12/2021 0957   GLUCOSEU NEGATIVE 07/12/2021 0957   HGBUR NEGATIVE 07/12/2021 0957   BILIRUBINUR NEGATIVE 07/12/2021 0957   BILIRUBINUR neg 05/18/2021 0756   KETONESUR NEGATIVE 07/12/2021 0957   PROTEINUR 30 (A) 07/12/2021 0957   UROBILINOGEN 0.2 05/18/2021 0756   NITRITE NEGATIVE 07/12/2021 0957   LEUKOCYTESUR NEGATIVE 07/12/2021 0957   Discussed in detail with patient's legal guardian, updated care and answered all questions.   Time coordinating discharge: 45 minutes  SIGNED:  Vernell Leep, MD,  FACP, [facility], [facility], [facility] (Care Management Physician Certified). [facility]  To contact the attending provider between 7A-7P or the covering provider  during after  hours 7P-7A, please log into the web site [email or web address] and access using universal [place] password for that web site. If you do not have the password, please call the hospital operator.

## 2021-07-20 ENCOUNTER — Telehealth: Payer: Self-pay | Admitting: Family Medicine

## 2021-07-20 DIAGNOSIS — J690 Pneumonitis due to inhalation of food and vomit: Secondary | ICD-10-CM | POA: Diagnosis not present

## 2021-07-20 DIAGNOSIS — G4733 Obstructive sleep apnea (adult) (pediatric): Secondary | ICD-10-CM | POA: Diagnosis not present

## 2021-07-20 DIAGNOSIS — A419 Sepsis, unspecified organism: Secondary | ICD-10-CM | POA: Diagnosis not present

## 2021-07-20 DIAGNOSIS — Z7984 Long term (current) use of oral hypoglycemic drugs: Secondary | ICD-10-CM | POA: Diagnosis not present

## 2021-07-20 DIAGNOSIS — I10 Essential (primary) hypertension: Secondary | ICD-10-CM | POA: Diagnosis not present

## 2021-07-20 DIAGNOSIS — G3184 Mild cognitive impairment, so stated: Secondary | ICD-10-CM | POA: Diagnosis not present

## 2021-07-20 DIAGNOSIS — N179 Acute kidney failure, unspecified: Secondary | ICD-10-CM | POA: Diagnosis not present

## 2021-07-20 DIAGNOSIS — H409 Unspecified glaucoma: Secondary | ICD-10-CM | POA: Diagnosis not present

## 2021-07-20 DIAGNOSIS — G9341 Metabolic encephalopathy: Secondary | ICD-10-CM | POA: Diagnosis not present

## 2021-07-20 DIAGNOSIS — E669 Obesity, unspecified: Secondary | ICD-10-CM | POA: Diagnosis not present

## 2021-07-20 DIAGNOSIS — I959 Hypotension, unspecified: Secondary | ICD-10-CM | POA: Diagnosis not present

## 2021-07-20 DIAGNOSIS — J9601 Acute respiratory failure with hypoxia: Secondary | ICD-10-CM | POA: Diagnosis not present

## 2021-07-20 DIAGNOSIS — D649 Anemia, unspecified: Secondary | ICD-10-CM | POA: Diagnosis not present

## 2021-07-20 DIAGNOSIS — E119 Type 2 diabetes mellitus without complications: Secondary | ICD-10-CM | POA: Diagnosis not present

## 2021-07-20 NOTE — Telephone Encounter (Signed)
Home Health verbal orders ?Caller Name:Stacy ?Agency Name: [facility] ? ?Callback number: [phone] ? ?Requesting OT/PT/Skilled nursing/Social Work/Speech: ? ?Reason: PT  ? ?Frequency:1 wk for 3 wks and medications atorvastatin (LIPITOR) 10 MG tablet,gemfibrozil (LOPID) 600 MG tablet are level 1  interaction  ? ?Please forward to [facility] pool or providers CMA  ?

## 2021-07-21 NOTE — Telephone Encounter (Signed)
Please ok those verbal orders  ?Thanks  ? ?I hope he is doing better and glad he is home ?

## 2021-07-22 NOTE — Telephone Encounter (Signed)
Left message on voicemail to call the office back. 

## 2021-07-25 ENCOUNTER — Other Ambulatory Visit: Payer: Self-pay

## 2021-07-25 ENCOUNTER — Encounter: Payer: Self-pay | Admitting: Family Medicine

## 2021-07-25 ENCOUNTER — Telehealth: Payer: Self-pay

## 2021-07-25 ENCOUNTER — Ambulatory Visit (INDEPENDENT_AMBULATORY_CARE_PROVIDER_SITE_OTHER): Payer: Medicare Other | Admitting: Family Medicine

## 2021-07-25 VITALS — BP 110/68 | HR 68 | Temp 97.4°F | Ht 66.5 in | Wt 198.4 lb

## 2021-07-25 DIAGNOSIS — R944 Abnormal results of kidney function studies: Secondary | ICD-10-CM

## 2021-07-25 DIAGNOSIS — I10 Essential (primary) hypertension: Secondary | ICD-10-CM

## 2021-07-25 DIAGNOSIS — J690 Pneumonitis due to inhalation of food and vomit: Secondary | ICD-10-CM | POA: Diagnosis not present

## 2021-07-25 DIAGNOSIS — E861 Hypovolemia: Secondary | ICD-10-CM

## 2021-07-25 DIAGNOSIS — I9589 Other hypotension: Secondary | ICD-10-CM | POA: Diagnosis not present

## 2021-07-25 DIAGNOSIS — N179 Acute kidney failure, unspecified: Secondary | ICD-10-CM

## 2021-07-25 DIAGNOSIS — Z1211 Encounter for screening for malignant neoplasm of colon: Secondary | ICD-10-CM

## 2021-07-25 DIAGNOSIS — G479 Sleep disorder, unspecified: Secondary | ICD-10-CM | POA: Diagnosis not present

## 2021-07-25 DIAGNOSIS — D649 Anemia, unspecified: Secondary | ICD-10-CM | POA: Diagnosis not present

## 2021-07-25 DIAGNOSIS — G4733 Obstructive sleep apnea (adult) (pediatric): Secondary | ICD-10-CM | POA: Diagnosis not present

## 2021-07-25 LAB — BASIC METABOLIC PANEL
BUN: 16 mg/dL (ref 6–23)
CO2: 29 mEq/L (ref 19–32)
Calcium: 9.5 mg/dL (ref 8.4–10.5)
Chloride: 103 mEq/L (ref 96–112)
Creatinine, Ser: 1.2 mg/dL (ref 0.40–1.50)
GFR: 60.83 mL/min (ref >60.00)
Glucose, Bld: 109 mg/dL — ABNORMAL HIGH (ref 70–99)
Potassium: 4.2 mEq/L (ref 3.5–5.1)
Sodium: 139 mEq/L (ref 135–145)

## 2021-07-25 LAB — CBC WITH DIFFERENTIAL/PLATELET
Basophils Absolute: 0 10*3/uL (ref 0.0–0.1)
Basophils Relative: 0.9 % (ref 0.0–3.0)
Eosinophils Absolute: 0 10*3/uL (ref 0.0–0.7)
Eosinophils Relative: 1.2 % (ref 0.0–5.0)
HCT: 30.7 % — ABNORMAL LOW (ref 39.0–52.0)
Hemoglobin: 10.5 g/dL — ABNORMAL LOW (ref 13.0–17.0)
Lymphocytes Relative: 30.7 % (ref 12.0–46.0)
Lymphs Abs: 0.9 10*3/uL (ref 0.7–4.0)
MCHC: 34.1 g/dL (ref 30.0–36.0)
MCV: 91.9 fl (ref 78.0–100.0)
Monocytes Absolute: 0.2 10*3/uL (ref 0.1–1.0)
Monocytes Relative: 5.3 % (ref 3.0–12.0)
Neutro Abs: 1.9 10*3/uL (ref 1.4–7.7)
Neutrophils Relative %: 61.9 % (ref 43.0–77.0)
Platelets: 393 10*3/uL (ref 150.0–400.0)
RBC: 3.34 Mil/uL — ABNORMAL LOW (ref 4.22–5.81)
RDW: 17.7 % — ABNORMAL HIGH (ref 11.5–15.5)
WBC: 3.1 10*3/uL — ABNORMAL LOW (ref 4.0–10.5)

## 2021-07-25 LAB — HEPATIC FUNCTION PANEL
ALT: 25 U/L (ref 0–53)
AST: 27 U/L (ref 0–37)
Albumin: 3.9 g/dL (ref 3.5–5.2)
Alkaline Phosphatase: 81 U/L (ref 39–117)
Bilirubin, Direct: 0.1 mg/dL (ref 0.0–0.3)
Total Bilirubin: 0.3 mg/dL (ref 0.2–1.2)
Total Protein: 6.9 g/dL (ref 6.0–8.3)

## 2021-07-25 NOTE — Telephone Encounter (Signed)
Pt came in today for follow up appt on 07/25/21.  ?

## 2021-07-25 NOTE — Telephone Encounter (Signed)
CALLED PATIENT NO ANSWER LEFT VOICEMAIL FOR A CALL BACK ?NEW PATIENT ANY ?

## 2021-07-25 NOTE — Assessment & Plan Note (Signed)
Much clinical improvement after supportive care and unasyn to augmentin 7 days ?Reviewed hospital records, lab results and studies in detail   ?Thought to be due to episode of vomiting  ?Reassuring swallowing w/u  ?For f/u with pulmonary later today  ?Reassuring exam  ?

## 2021-07-25 NOTE — Assessment & Plan Note (Signed)
With new anemia but not low on iron  ?Ref done to GI ? ?

## 2021-07-25 NOTE — Patient Instructions (Addendum)
Labs today  ?Follow up with pulmonary and hematology as planned  ? ?Let me know if you notice any swallowing problems or cough or choking with eating  ? ? ?I will place a GI referral - you will get a call  ? ? ?

## 2021-07-25 NOTE — Assessment & Plan Note (Signed)
Improved with hydration  ?Renal panel today ?

## 2021-07-25 NOTE — Assessment & Plan Note (Signed)
bp in fair control at this time  ?BP Readings from Last 1 Encounters:  ?07/25/21 110/68  ? ?No changes needed ?Most recent labs reviewed  ?Was hypotensive in hospital and now stable  ?No longer on midodrine  ?Holding lisinopril (will not re start unless higher bp) ?

## 2021-07-25 NOTE — Progress Notes (Signed)
? ?Subjective:  ? ? Patient ID: Daniel Bailey., male    DOB: [birth year], 71 y.o.   MRN: [id number] ? ?This visit occurred during the SARS-CoV-2 public health emergency.  Safety protocols were in place, including screening questions prior to the visit, additional usage of staff PPE, and extensive cleaning of exam room while observing appropriate contact time as indicated for disinfecting solutions.  ? ?HPI ?Pt presents for f/u of hospitalization for pneumonia and sepsis  ? ?Wt Readings from Last 3 Encounters:  ?07/25/21 198 lb 6.4 oz (90 kg)  ?07/05/21 196 lb 8 oz (89.1 kg)  ?05/17/21 198 lb 2 oz (89.9 kg)  ? ?31.54 kg/m? ? ? ?Was seen for mental status change ?Had episode of n/v then possibly aspirated  ?Hospital course as follows: ? ?Assessment and Plan: ?* Sepsis ([place]) ?On further review, had severe sepsis on admission due to presence of sepsis along with organ dysfunction including AKI, lactic acidosis and mental status changes. ?Due to aspiration pneumonia. ?Met sepsis criteria on admission. ?Treated with aggressive IV fluid boluses and maintenance IV fluids and broad-spectrum antibiotics. ?Had intermittent episodes of hypothermia and hypotension despite patient denying complaints, looking well and nontoxic.  ?No hypothermic episodes in the last 72 hours.  Occasional mild soft BPs with SBP's in the 90s but asymptomatic. ?Random cortisol 23.  Blood cultures x2 from admission negative to date. ?Legal guardian unaware of issues with hypotension or hypothermia. ?  ?Last cxr ?DG Chest Portable 1 View (Accession [id number]) (Order [id number]) ?Imaging ?Date: 07/12/2021 Department: [facility] GENERAL SURGERY Released By/Authorizing: Rada Hay, MD (auto-released)  ? ?Exam Status ? ?Status  ?Final [99]  ? ?PACS Intelerad Image Link ? ? Show images for DG Chest Portable 1 View ? ?Study Result ? ?Narrative & Impression  ?CLINICAL DATA:  Chest pain ?  ?EXAM: ?PORTABLE CHEST 1 VIEW ?  ?COMPARISON:   05/31/2021 ?  ?FINDINGS: ?Retrocardiac infiltrate. No edema, effusion, or pneumothorax. Normal ?heart size and mediastinal contours. ?  ?IMPRESSION: ?Retrocardiac infiltrate. ?  ? ? ? ? ?Aspiration pneumonia ([place]) ?Not hypoxic. ?Transitioned from IV Unasyn to Augmentin and has completed approximately 7 days course.  No further antibiotics at discharge. ?Likely due to mental status changes. ?SLP evaluation appreciated.  Do not feel that he has oropharyngeal dysphagia.  His aspiration may be related to underlying esophageal pathology.  ?SLP follow-up appreciated and have recommended mechanical soft diet, thin liquids and PPI. ?Tolerating above diet.  Consider outpatient GI evaluation. ?Recommend repeating chest x-ray in 4 weeks to ensure resolution of pneumonia findings ?  ?AKI (acute kidney injury) ([place]) ?Presented with creatinine of 2.21. ?Likely related to GI losses, poor oral intake and lisinopril. ?Resolved.  Creatinine down to 1.11.  Replaced hypokalemia. ?Now appears volume overloaded due to substantial IV fluids during this hospitalization.  Upper extremity edema slowly improving. ?Discontinued lisinopril especially given episodic soft blood pressures and on midodrine. ?Reassess during close outpatient follow-up. ?  ?Hyperkalemia ?Presented with potassium of 6.2. ?S/p calcium gluconate, Lokelma, albuterol and insulin ?Treated and resolved. ?Potassium normal on day of discharge. ?  ?Lactic acidosis ?Resolved. ?  ?Acute metabolic encephalopathy ?Multifactorial due to sepsis, AKI and pneumonia. ?Will need to check with family regarding his baseline and current status. ?Supportive care. ?Mental status now back to baseline. ?  ?OSA (obstructive sleep apnea) ?Per discussion with legal guardian, no known history of OSA and was not on CPAP PTA. ?Pulmonologist on admission and indicated OSA and placed on CPAP ?Over  the last 2 nights, patient having apneic spells while on CPAP and hence RT changed to  BiPAP. ?Communicated with pulmonology who indicated that patient will need a outpatient formal PSG and transcutaneous CO2 monitoring but will evaluate patient prior to discharge to see if he will qualify for NIV such as Trilogy ?Pulmonology follow-up appreciated, getting nocturnal pulse oximetry overnight prior to admission and patient did not have hypoxia.  I communicated with pulmonology who recommended discharging him on no CPAP and will follow-up in office for formal evaluation of OSA and management. ?  ?Hypothermia ?Unclear etiology. ?Having intermittent episodes of hypothermia although patient himself denies complaints, looks well, not septic or toxic and mentating at his baseline normally. ?Random cortisol: 23, TSH 3.82. ?None within the last 72 hours. ?  ?Acute respiratory failure with hypoxia ([place]) ?Acute respiratory failure was present on admission.  Needed brief CPAP followed by BiPAP. ?PCCM input appreciated.  Indicated that patient has OSA.  Placed on CPAP nightly. ?Overnight 3/9, patient had a couple of apneic spells while on CPAP and was transitioned to BiPAP by RT. ?As per guardian, did not know of OSA diagnosis and did not use CPAP prior to admission. ?Acute respiratory failure resolved ?Pulmonology evaluating for OSA, possible need for CPAP at discharge, check nocturnal oxygen saturations overnight prior to discharge was not hypoxic and was saturating in the high 90s up to 100% on room air. ?  ?  ?Normocytic anemia ?Presented with hemoglobin of 9.8 which has gradually dropped to a low of 7.1 in the absence of overt bleeding. ?May be multifactorial related to dilutional, acute illness.  ?Anemia panel unremarkable thus far and suggestive of chronic disease. ?Patient had a normal-appearing stool on 3/11 but unfortunately FOBT testing was never sent. ?Hemoglobin dropped to 6 g per DL on 3/11 in the absence of overt bleeding.  S/p 2 units PRBCs and hemoglobin up to 9.2. ?Has seen hematology on  07/05/2021.  Immunofixation without evidence of monoclonal protein.  Elevated light chains, unclear significance.  LDH normal.  Copper level elevated mildly.  No significant immunophenotypic abnormality detected on flow cytometry panel. ?Outpatient follow-up with hematology ??  EGD and colonoscopy as outpatient.  Last colonoscopy in 2013 and chart was normal. ?Hemoglobin improved to 10.1.  Although no melena, FOBT positive. ?Recommend close follow-up of CBC as outpatient by PCP/hematology. ?Recommend GI consultation for consideration for EGD and colonoscopy as outpatient. ?  ? ?Hypertension/hypotension ?Patient also on midodrine for episodic hypotension noted in the hospital.  Lisinopril discontinued.  Reassess during close outpatient follow-up if midodrine can be weaned off. ? ?Hematology f/u  ?Dr Talbert Cage is on 3/28  ? ?Pulmonary f/u-has appt today at 1:15  ?Dr Luvenia Heller  ? ? ?Most recent labs ?Lab Results  ?Component Value Date  ? CREATININE 1.11 07/18/2021  ? BUN 19 07/18/2021  ? NA 139 07/18/2021  ? K 3.9 07/18/2021  ? CL 105 07/18/2021  ? CO2 26 07/18/2021  ?Drinking lots of fluids  ?Lab Results  ?Component Value Date  ? ALT 26 07/12/2021  ? AST 62 (H) 07/12/2021  ? ALKPHOS 104 07/12/2021  ? BILITOT 0.7 07/12/2021  ? ?Lab Results  ?Component Value Date  ? LIPASE 53 (H) 07/12/2021  ? ?Lab Results  ?Component Value Date  ? WBC 5.7 07/18/2021  ? HGB 10.1 (L) 07/18/2021  ? HCT 30.2 (L) 07/18/2021  ? MCV 91.0 07/18/2021  ? PLT 237 07/18/2021  ? ?Lab Results  ?Component Value Date  ? INR 1.0 07/12/2021  ? ? ?  BP Readings from Last 3 Encounters:  ?07/25/21 110/68  ?07/18/21 (!) 114/59  ?07/05/21 91/78  ? ?Pulse Readings from Last 3 Encounters:  ?07/25/21 68  ?07/18/21 (!) 45  ?07/05/21 63  ? ?Pulse ox is 97% today  ? ?Very close to being back to himself (his original self)  ?Walking a bit slower ?PT is coming to the house and that is helpful  ?Used a walker very briefly  ? ? ?Patient Active Problem List  ? Diagnosis Date Noted   ? OSA (obstructive sleep apnea) 07/16/2021  ? Lactic acidosis 07/13/2021  ? Acute metabolic encephalopathy 15/40/0867  ? Aspiration pneumonia ([place])   ? Decreased GFR 05/26/2021  ? Normocytic anemia 05/26/2021  ? Cerumen impacti

## 2021-07-25 NOTE — Assessment & Plan Note (Signed)
With sepsis  ?Resolved ?No longer on midodrine ?

## 2021-07-25 NOTE — Assessment & Plan Note (Signed)
With sepsis/pna  ?Resolved  ?Re check renal panel today ?

## 2021-07-25 NOTE — Assessment & Plan Note (Signed)
This worsened greatly in setting of sepsis recently and improved after 2 unit transfer ?Reviewed hospital records, lab results and studies in detail   ?For follow-up with hematology later this month ?Hemoglobin increased to tell at 10.1 at discharge from the hospital ?Also FOBT was positive ?Referral done to GI for evaluation and possible endoscopy and colonoscopy ?CBC today ? ? ?

## 2021-07-25 NOTE — Assessment & Plan Note (Signed)
?   If he has this  ?For pulmonary visit today ?

## 2021-07-26 ENCOUNTER — Telehealth: Payer: Self-pay

## 2021-07-26 DIAGNOSIS — J9601 Acute respiratory failure with hypoxia: Secondary | ICD-10-CM | POA: Diagnosis not present

## 2021-07-26 DIAGNOSIS — A419 Sepsis, unspecified organism: Secondary | ICD-10-CM | POA: Diagnosis not present

## 2021-07-26 DIAGNOSIS — J690 Pneumonitis due to inhalation of food and vomit: Secondary | ICD-10-CM | POA: Diagnosis not present

## 2021-07-26 DIAGNOSIS — G4733 Obstructive sleep apnea (adult) (pediatric): Secondary | ICD-10-CM | POA: Diagnosis not present

## 2021-07-26 DIAGNOSIS — N179 Acute kidney failure, unspecified: Secondary | ICD-10-CM | POA: Diagnosis not present

## 2021-07-26 DIAGNOSIS — G9341 Metabolic encephalopathy: Secondary | ICD-10-CM | POA: Diagnosis not present

## 2021-07-26 NOTE — Telephone Encounter (Signed)
Scheduled for 11/14/2021 ?

## 2021-07-27 DIAGNOSIS — D649 Anemia, unspecified: Secondary | ICD-10-CM | POA: Diagnosis not present

## 2021-07-27 DIAGNOSIS — H409 Unspecified glaucoma: Secondary | ICD-10-CM

## 2021-07-27 DIAGNOSIS — J690 Pneumonitis due to inhalation of food and vomit: Secondary | ICD-10-CM | POA: Diagnosis not present

## 2021-07-27 DIAGNOSIS — G9341 Metabolic encephalopathy: Secondary | ICD-10-CM | POA: Diagnosis not present

## 2021-07-27 DIAGNOSIS — I10 Essential (primary) hypertension: Secondary | ICD-10-CM | POA: Diagnosis not present

## 2021-07-27 DIAGNOSIS — Z7984 Long term (current) use of oral hypoglycemic drugs: Secondary | ICD-10-CM

## 2021-07-27 DIAGNOSIS — G3184 Mild cognitive impairment, so stated: Secondary | ICD-10-CM | POA: Diagnosis not present

## 2021-07-27 DIAGNOSIS — N179 Acute kidney failure, unspecified: Secondary | ICD-10-CM | POA: Diagnosis not present

## 2021-07-27 DIAGNOSIS — E669 Obesity, unspecified: Secondary | ICD-10-CM | POA: Diagnosis not present

## 2021-07-27 DIAGNOSIS — E119 Type 2 diabetes mellitus without complications: Secondary | ICD-10-CM | POA: Diagnosis not present

## 2021-07-27 DIAGNOSIS — J9601 Acute respiratory failure with hypoxia: Secondary | ICD-10-CM | POA: Diagnosis not present

## 2021-07-27 DIAGNOSIS — G4733 Obstructive sleep apnea (adult) (pediatric): Secondary | ICD-10-CM | POA: Diagnosis not present

## 2021-07-27 DIAGNOSIS — A419 Sepsis, unspecified organism: Secondary | ICD-10-CM | POA: Diagnosis not present

## 2021-07-27 DIAGNOSIS — I959 Hypotension, unspecified: Secondary | ICD-10-CM | POA: Diagnosis not present

## 2021-08-02 ENCOUNTER — Inpatient Hospital Stay: Payer: Medicare Other | Attending: Oncology | Admitting: Oncology

## 2021-08-02 ENCOUNTER — Encounter: Payer: Self-pay | Admitting: Oncology

## 2021-08-02 ENCOUNTER — Other Ambulatory Visit: Payer: Self-pay

## 2021-08-02 VITALS — BP 124/74 | HR 67 | Temp 97.1°F | Wt 193.0 lb

## 2021-08-02 DIAGNOSIS — Z7984 Long term (current) use of oral hypoglycemic drugs: Secondary | ICD-10-CM | POA: Insufficient documentation

## 2021-08-02 DIAGNOSIS — I10 Essential (primary) hypertension: Secondary | ICD-10-CM | POA: Diagnosis not present

## 2021-08-02 DIAGNOSIS — E785 Hyperlipidemia, unspecified: Secondary | ICD-10-CM | POA: Insufficient documentation

## 2021-08-02 DIAGNOSIS — D649 Anemia, unspecified: Secondary | ICD-10-CM

## 2021-08-02 DIAGNOSIS — J189 Pneumonia, unspecified organism: Secondary | ICD-10-CM | POA: Diagnosis not present

## 2021-08-02 DIAGNOSIS — K8020 Calculus of gallbladder without cholecystitis without obstruction: Secondary | ICD-10-CM | POA: Diagnosis not present

## 2021-08-02 DIAGNOSIS — D72819 Decreased white blood cell count, unspecified: Secondary | ICD-10-CM | POA: Diagnosis not present

## 2021-08-02 DIAGNOSIS — Z8616 Personal history of COVID-19: Secondary | ICD-10-CM | POA: Diagnosis not present

## 2021-08-02 DIAGNOSIS — R4182 Altered mental status, unspecified: Secondary | ICD-10-CM | POA: Diagnosis not present

## 2021-08-02 DIAGNOSIS — Z79899 Other long term (current) drug therapy: Secondary | ICD-10-CM | POA: Diagnosis not present

## 2021-08-02 NOTE — Progress Notes (Signed)
?Hematology/Oncology Progress note ?[phone] [phone] ?  ? ?  ? ?   ? ? ?Patient Care Team: ?Tower, Wynelle Fanny, MD as PCP - General ?Earlie Server, MD as Consulting Physician (Hematology and Oncology) ? ?REFERRING PROVIDER: ?Tower, Wynelle Fanny, MD  ?CHIEF COMPLAINTS/REASON FOR VISIT:  ? ? ?HISTORY OF PRESENTING ILLNESS:  ? ?Daniel Bailey. is a  71 y.o.  male with PMH listed below was seen in consultation at the request of  Tower, Wynelle Fanny, MD  for evaluation of anemia. ? 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  He was accompanied by his cousin who is also his guardian [name] to the clinic today.  Patient has no new complaints.  Cousin reports that patient has good appetite, no significant unintentional weight loss.  No fever or night sweats.  ? ?Family history includes mother had colon cancer.  Father had cancer, unknown details. ?Patient had a remote colonoscopy in 2012. ? ?INTERVAL HISTORY ?Daniel Bailey. is a 71 y.o. male who has above history reviewed by me today presents for follow up visit for anemia.   ?Patient has had blood work done after last visit and present to discuss result.  ? During the interval, patient was hospitalized from 07/12/2021 - 07/18/2021.  ?Patient was admitted due to altered mental status, vomiting, poor oral intake.  AKI.  Symptoms were felt to be secondary to aspiration pneumonia.  Patient was treated with antibiotics and supportive care.  Mental status improves close to his baseline and patient was discharged patient is anemia at baseline is between 9-10, dropped to 6 during the hospitalization ?Status post PRBC transfusion in the hospital.  Hemoglobin improved and went up to 9.2 ?Patient was recommended to establish care/follow-up with gastroenterology.  Last EGD colonoscopy was done in 2013 by [facility]. ?. ?Today patient was accompanied by his son [name]. ? ? ?Review of Systems  ?Unable to perform ROS: Other (Development delay)  ? ?MEDICAL HISTORY:   ?Past Medical History:  ?Diagnosis Date  ? Anemia   ? Elevated PSA   ? Frequent urination   ? Glaucoma   ? History of COVID-19   ? Hyperglycemia   ? Hyperlipidemia   ? Hypertension   ? Sleep apnea   ? ? ?SURGICAL HISTORY: ?Past Surgical History:  ?Procedure Laterality Date  ? COLONOSCOPY    ? PROSTATE BIOPSY N/A 11/20/2017  ? Procedure: BIOPSY TRANSRECTAL ULTRASONIC PROSTATE (TUBP);  Surgeon: Abbie Sons, MD;  Location: [facility] ORS;  Service: Urology;  Laterality: N/A;  ? ? ?SOCIAL HISTORY: ?Social History  ? ?Socioeconomic History  ? Marital status: Married  ?  Spouse name: Not on file  ? Number of children: Not on file  ? Years of education: Not on file  ? Highest education level: Not on file  ?Occupational History  ? Not on file  ?Tobacco Use  ? Smoking status: Never  ? Smokeless tobacco: Never  ?Vaping Use  ? Vaping Use: Never used  ?Substance and Sexual Activity  ? Alcohol use: No  ?  Alcohol/week: 0.0 standard drinks  ? Drug use: No  ? Sexual activity: Not Currently  ?Other Topics Concern  ? Not on file  ?Social History Narrative  ? Not on file  ? ?Social Determinants of Health  ? ?Financial Resource Strain: Low Risk   ? Difficulty of Paying Living Expenses: Not hard at all  ?Food Insecurity: No Food Insecurity  ? Worried About Charity fundraiser in the  Last Year: Never true  ? Ran Out of Food in the Last Year: Never true  ?Transportation Needs: No Transportation Needs  ? Lack of Transportation (Medical): No  ? Lack of Transportation (Non-Medical): No  ?Physical Activity: Inactive  ? Days of Exercise per Week: 0 days  ? Minutes of Exercise per Session: 0 min  ?Stress: No Stress Concern Present  ? Feeling of Stress : Not at all  ?Social Connections: Not on file  ?Intimate Partner Violence: Not At Risk  ? Fear of Current or Ex-Partner: No  ? Emotionally Abused: No  ? Physically Abused: No  ? Sexually Abused: No  ? ? ?FAMILY HISTORY: ?Family History  ?Problem Relation Age of Onset  ? Colon cancer Mother   ?  Kidney disease Mother   ? Cancer Father   ?     unknown  ? ? ?ALLERGIES:  has No Known Allergies. ? ?MEDICATIONS:  ?Current Outpatient Medications  ?Medication Sig Dispense Refill  ? acetaminophen (TYLENOL) 500 MG tablet Take 1,000 mg by mouth daily as needed for headache.    ? atorvastatin (LIPITOR) 10 MG tablet Take 1 tablet (10 mg total) by mouth daily. In the evening 90 tablet 3  ? gemfibrozil (LOPID) 600 MG tablet Take 1 tablet (600 mg total) by mouth 2 (two) times daily. 180 tablet 3  ? metFORMIN (GLUCOPHAGE-XR) 500 MG 24 hr tablet Take 2 tablets (1,000 mg total) by mouth daily with breakfast. 180 tablet 3  ? midodrine (PROAMATINE) 5 MG tablet Take 1 tablet (5 mg total) by mouth 3 (three) times daily with meals. 90 tablet 0  ? pantoprazole (PROTONIX) 40 MG tablet Take 1 tablet (40 mg total) by mouth daily. 30 tablet 1  ? ?No current facility-administered medications for this visit.  ? ? ? ?PHYSICAL EXAMINATION: ?ECOG PERFORMANCE STATUS: 1 - Symptomatic but completely ambulatory ?Vitals:  ? 08/02/21 1434  ?BP: 124/74  ?Pulse: 67  ?Temp: (!) 97.1 ?F (36.2 ?C)  ? ?Filed Weights  ? 08/02/21 1434  ?Weight: 193 lb (87.5 kg)  ? ? ?Physical Exam ?Constitutional:   ?   General: He is not in acute distress. ?HENT:  ?   Head: Normocephalic and atraumatic.  ?Eyes:  ?   General: No scleral icterus. ?Cardiovascular:  ?   Rate and Rhythm: Normal rate and regular rhythm.  ?   Heart sounds: Normal heart sounds.  ?Pulmonary:  ?   Effort: Pulmonary effort is normal. No respiratory distress.  ?   Breath sounds: No wheezing.  ?Abdominal:  ?   General: Bowel sounds are normal. There is no distension.  ?   Palpations: Abdomen is soft.  ?Musculoskeletal:     ?   General: No deformity. Normal range of motion.  ?   Cervical back: Normal range of motion and neck supple.  ?Skin: ?   General: Skin is warm and dry.  ?   Findings: No erythema or rash.  ?Neurological:  ?   Mental Status: He is alert. Mental status is at baseline.  ?    Cranial Nerves: No cranial nerve deficit.  ?   Coordination: Coordination normal.  ?   Comments: Patient answer simple questions.  Follows commands  ?Psychiatric:     ?   Mood and Affect: Mood normal.  ? ? ?LABORATORY DATA:  ?I have reviewed the data as listed ?Lab Results  ?Component Value Date  ? WBC 3.1 (L) 07/25/2021  ? HGB 10.5 (L) 07/25/2021  ? HCT 30.7 (  L) 07/25/2021  ? MCV 91.9 07/25/2021  ? PLT 393.0 07/25/2021  ? ?Recent Labs  ?  02/09/21 ?1007 05/17/21 ?1538 07/12/21 ?3785 07/12/21 ?1556 07/16/21 ?8850 07/17/21 ?2774 07/18/21 ?1287 07/25/21 ?1046  ?NA 136   < > 136   < > 139 137 139 139  ?K 4.1   < > 6.2*   < > 4.0 3.4* 3.9 4.2  ?CL 98   < > 96*   < > 106 105 105 103  ?CO2 27   < > 25   < > '25 26 26 29  '$ ?GLUCOSE 79   < > 239*   < > 93 96 95 109*  ?BUN 18   < > 41*   < > '21 20 19 16  '$ ?CREATININE 1.11   < > 2.83*   < > 1.34* 1.21 1.11 1.20  ?CALCIUM 9.8   < > 9.6   < > 8.5* 8.6* 8.9 9.5  ?GFRNONAA  --   --  23*   < > 57* >60 >60  --   ?PROT 7.1  --  7.8  --   --   --   --  6.9  ?ALBUMIN 4.1  --  3.5  --   --   --   --  3.9  ?AST 29  --  62*  --   --   --   --  27  ?ALT 17  --  26  --   --   --   --  25  ?ALKPHOS 88  --  104  --   --   --   --  81  ?BILITOT 0.3  --  0.7  --   --   --   --  0.3  ?BILIDIR  --   --   --   --   --   --   --  0.1  ? < > = values in this interval not displayed.  ? ? ?Iron/TIBC/Ferritin/ %Sat ?   ?Component Value Date/Time  ? IRON 120 07/14/2021 0555  ? TIBC 340 07/14/2021 0555  ? FERRITIN 207 07/14/2021 0555  ? IRONPCTSAT 35 07/14/2021 0555  ? ?  ? ? ?RADIOGRAPHIC STUDIES: ?I have personally reviewed the radiological images as listed and agreed with the findings in the report. ?CT HEAD WO CONTRAST (5MM) ? ?Result Date: 07/12/2021 ?CLINICAL DATA:  Projectile vomiting. Mental status change with unknown cause. EXAM: CT HEAD WITHOUT CONTRAST TECHNIQUE: Contiguous axial images were obtained from the base of the skull through the vertex without intravenous contrast. RADIATION DOSE  REDUCTION: This exam was performed according to the departmental dose-optimization program which includes automated exposure control, adjustment of the mA and/or kV according to patient size and/or use of iterative

## 2021-08-03 ENCOUNTER — Encounter: Payer: Self-pay | Admitting: Family Medicine

## 2021-08-05 ENCOUNTER — Telehealth: Payer: Self-pay

## 2021-08-05 DIAGNOSIS — G4733 Obstructive sleep apnea (adult) (pediatric): Secondary | ICD-10-CM | POA: Diagnosis not present

## 2021-08-05 DIAGNOSIS — G9341 Metabolic encephalopathy: Secondary | ICD-10-CM | POA: Diagnosis not present

## 2021-08-05 DIAGNOSIS — A419 Sepsis, unspecified organism: Secondary | ICD-10-CM | POA: Diagnosis not present

## 2021-08-05 DIAGNOSIS — J690 Pneumonitis due to inhalation of food and vomit: Secondary | ICD-10-CM | POA: Diagnosis not present

## 2021-08-05 DIAGNOSIS — N179 Acute kidney failure, unspecified: Secondary | ICD-10-CM | POA: Diagnosis not present

## 2021-08-05 DIAGNOSIS — J9601 Acute respiratory failure with hypoxia: Secondary | ICD-10-CM | POA: Diagnosis not present

## 2021-08-05 NOTE — Telephone Encounter (Signed)
Please ok those verbal orders  

## 2021-08-05 NOTE — Telephone Encounter (Signed)
Daniel Bailey called from [facility] requesting extended PT for once a week x 3 more weeks.  ? ?Can leave message on secure VM.  ?

## 2021-08-05 NOTE — Telephone Encounter (Signed)
Verbal order given to Stacy  

## 2021-08-08 ENCOUNTER — Telehealth: Payer: Self-pay

## 2021-08-08 DIAGNOSIS — A419 Sepsis, unspecified organism: Secondary | ICD-10-CM | POA: Diagnosis not present

## 2021-08-08 DIAGNOSIS — G9341 Metabolic encephalopathy: Secondary | ICD-10-CM | POA: Diagnosis not present

## 2021-08-08 DIAGNOSIS — G4733 Obstructive sleep apnea (adult) (pediatric): Secondary | ICD-10-CM | POA: Diagnosis not present

## 2021-08-08 DIAGNOSIS — N179 Acute kidney failure, unspecified: Secondary | ICD-10-CM | POA: Diagnosis not present

## 2021-08-08 DIAGNOSIS — J690 Pneumonitis due to inhalation of food and vomit: Secondary | ICD-10-CM | POA: Diagnosis not present

## 2021-08-08 DIAGNOSIS — J9601 Acute respiratory failure with hypoxia: Secondary | ICD-10-CM | POA: Diagnosis not present

## 2021-08-08 NOTE — Telephone Encounter (Signed)
Unable to reach Shirlean Mylar and [place] CMA is speaking with [name] now. Sending note to [place]. ?

## 2021-08-08 NOTE — Telephone Encounter (Signed)
[name] called back pt's PT came to do his normal PT and did vital signs his pulse was still in the 40s sitting down but it did go back into normal range when he was doing PT and moving and walking around and all other vitals were normal. Pt does have some memory issues and let them know he didn't think he had eaten today which could have contributed to his sxs. [name] did give him some food and PT did check him out and said he is stable with no sxs but did recommend that he be checked out just to be on the safe side. PCP has no appts available tomorrow so appt scheduled with Lawerance Bach, NP and ER precautions given   ? ?FYI to PCP and Lawerance Bach, NP ?

## 2021-08-08 NOTE — Telephone Encounter (Signed)
[facility] - Client ?TELEPHONE ADVICE RECORD ?AccessNurse? ?Patient ?Name: ?Daniel Bailey ?D ?Gender: Male ?DOB: 1951-02-19 ?Age: 71 Y 7 M 1 D ?Return ?Phone ?Number: ?[phone] ?(Primary), ?[id number] ?(Secondary) ?Address: ?City/ ?State/ ?Zip: [place]  ? [address] ?Client [facility] - Client ?Client Site [place] [facility] ?Provider Tower, Roque Lias - MD ?Contact Type Call ?Who Is Calling Patient / Member / Family / Caregiver ?Call Type Triage / Clinical ?Caller Name Shirlean Mylar ?Relationship To Patient Care Giver ?Return Phone Number [phone] (Secondary) ?Chief Complaint Blood Pressure Low ?Reason for Call Symptomatic / Request for Health Information ?Initial Comment Caller states she has a pt with pulse low ?pulse.Blood pressure 118/62 ?Translation No ?Nurse Assessment ?Nurse: Fredderick Phenix, RN, Lelan Pons Date/Time Eilene Ghazi Time): 08/08/2021 1:20:52 PM ?Confirm and document reason for call. If ?symptomatic, describe symptoms. ?---Caller states her patient has a low pulse of 36-41, ?when she checks manually its 44. Bp is 118/62. He is ?feeling and acting well. ?Does the patient have any new or worsening ?symptoms? ---Yes ?Will a triage be completed? ---Yes ?Related visit to physician within the last 2 weeks? ---No ?Does the PT have any chronic conditions? (i.e. ?diabetes, asthma, this includes High risk factors for ?pregnancy, etc.) ?---Yes ?List chronic conditions. ---aspiration PNE, metabolitic insufficiency, HTN, ?diabetes, cognitive impairment. ?Is this a behavioral health or substance abuse call? ---No ?Guidelines ?Guideline Title Affirmed Question Affirmed Notes Nurse Date/Time (Eastern ?Time) ?Heart Rate and ?Heartbeat Questions ?Heart beating very ?slowly (e.g., < 50 / ?minute) (Exception: ?athlete and heart rate ?normal for caller) ?Fredderick Phenix, RN, Lelan Pons 08/08/2021 1:23:46 PM ?Disp. Time (Eastern ?Time) Disposition Final User ?PLEASE NOTE: All timestamps  contained within this report are represented as [place] Standard Time. ?CONFIDENTIALTY NOTICE: This fax transmission is intended only for the addressee. It contains information that is legally privileged, confidential or ?otherwise protected from use or disclosure. If you are not the intended recipient, you are strictly prohibited from reviewing, disclosing, copying using ?or disseminating any of this information or taking any action in reliance on or regarding this information. If you have received this fax in error, please ?notify us immediately by telephone so that we can arrange for its return to [place]. Phone: [phone], Toll-Free: [phone], Fax: [phone] ?Page: 2 of 2 ?Call Id: [id number] ?08/08/2021 1:26:11 PM Go to ED Now Yes Fredderick Phenix, RN, Lelan Pons ?Caller Disagree/Comply Comply ?Caller Understands Yes ?PreDisposition Did not know what to do ?Care Advice Given Per Guideline ?GO TO ED NOW: * You need to be seen in the Emergency Department. * Leave now. Drive carefully. * Another adult should drive. ?NOTE TO TRIAGER - DRIVING: ANOTHER ADULT SHOULD DRIVE: * It is better and safer if another adult drives instead of ?you. CARE ADVICE given per Heart Rate and Heartbeat Questions (Adult) guideline. ?Referrals ?GO TO FACILITY UNDECIDE ?

## 2021-08-08 NOTE — Telephone Encounter (Signed)
Aware, I am out of town, thanks for the attention and appt with Tabitha  ?

## 2021-08-09 ENCOUNTER — Ambulatory Visit (INDEPENDENT_AMBULATORY_CARE_PROVIDER_SITE_OTHER): Payer: Medicare Other | Admitting: Family

## 2021-08-09 ENCOUNTER — Encounter: Payer: Self-pay | Admitting: Family

## 2021-08-09 VITALS — BP 98/82 | HR 64 | Temp 96.8°F | Resp 16 | Ht 66.5 in | Wt 191.3 lb

## 2021-08-09 DIAGNOSIS — J690 Pneumonitis due to inhalation of food and vomit: Secondary | ICD-10-CM

## 2021-08-09 DIAGNOSIS — R350 Frequency of micturition: Secondary | ICD-10-CM

## 2021-08-09 DIAGNOSIS — I4510 Unspecified right bundle-branch block: Secondary | ICD-10-CM | POA: Diagnosis not present

## 2021-08-09 DIAGNOSIS — I517 Cardiomegaly: Secondary | ICD-10-CM

## 2021-08-09 DIAGNOSIS — R001 Bradycardia, unspecified: Secondary | ICD-10-CM

## 2021-08-09 NOTE — Patient Instructions (Signed)
A referral was placed today for cardiologist.  ?Please let us know if you have not heard back within 1 week about your referral. ? ?Taking urine sample today to rule out infection.  ? ?If any chest pain, dizziness, increasing fatigue or lethargy, palpitations/ fast heart rate and or shortness of breath evidenced go to ER and or call 911.  ? ?Need to follow up with pulmonary in regards to results of chest xray as low heart rate may be related to recent pneumonia/ lung problems.  ? ?It was a pleasure seeing you today! Please do not hesitate to reach out with any questions and or concerns. ? ?Regards,  ? ?Neal Oshea ?FNP-C ? ?

## 2021-08-09 NOTE — Progress Notes (Signed)
? ?Established Patient Office Visit ? ?Subjective:  ?Patient ID: Daniel Bailey., male    DOB: 08/11/1950  Age: 71 y.o. MRN: [id number] ? ?CC:  ?Chief Complaint  ?Patient presents with  ? low pulse  ?  Before starting Pt  pulse was 38  ? ? ?HPI ?Daniel Bailey. is here today with concerns.  ? ?Yesterday white at PT, they noticed his pulse was at 38 bpm. Had him walk around for a little bit and his pulse came up to the 40's. He had not eaten much yesterday while at PT so they thought maybe this had contributed to low heart rate.  ? ?Accompanied today by cousin who pt also lives with who is answering for him as pt with some memory issues and intellectual disability. Cousin doesn't notice any increased fatigue, no sob or breathing issues.  ? Pt denies dizziness, palpitations, chest pain, or shortness of breath.  ? ?Reviewing cbc 3/20 anemia is stable, bmp stable. Has increased urinary frequency but he has a lot has increased intake of water.  ? ?3/7 CT abd pelvis did show left lower lobe pneumonia, infiltrate with underlying aspiration given fluid distended esophagus and left lower lobe airway opacification.  ? Has sleep study scheduled for April 10th  ? Has to call to make f/u appt with pulmonologist.  ?Did have repeat cxr per cousin and cousin's mom at pulmonologist office however I am unable to review CXR results.  ? ?Ekg reviewed 07/12/21 ? ? ?Past Medical History:  ?Diagnosis Date  ? Anemia   ? Elevated PSA   ? Frequent urination   ? Glaucoma   ? History of COVID-19   ? Hyperglycemia   ? Hyperlipidemia   ? Hypertension   ? Sleep apnea   ? ? ?Past Surgical History:  ?Procedure Laterality Date  ? COLONOSCOPY    ? PROSTATE BIOPSY N/A 11/20/2017  ? Procedure: BIOPSY TRANSRECTAL ULTRASONIC PROSTATE (TUBP);  Surgeon: Abbie Sons, MD;  Location: [facility] ORS;  Service: Urology;  Laterality: N/A;  ? ? ?Family History  ?Problem Relation Age of Onset  ? Colon cancer Mother   ? Kidney disease Mother   ? Cancer Father   ?      unknown  ? ? ?Social History  ? ?Socioeconomic History  ? Marital status: Married  ?  Spouse name: Not on file  ? Number of children: Not on file  ? Years of education: Not on file  ? Highest education level: Not on file  ?Occupational History  ? Not on file  ?Tobacco Use  ? Smoking status: Never  ? Smokeless tobacco: Never  ?Vaping Use  ? Vaping Use: Never used  ?Substance and Sexual Activity  ? Alcohol use: No  ?  Alcohol/week: 0.0 standard drinks  ? Drug use: No  ? Sexual activity: Not Currently  ?Other Topics Concern  ? Not on file  ?Social History Narrative  ? Not on file  ? ?Social Determinants of Health  ? ?Financial Resource Strain: Low Risk   ? Difficulty of Paying Living Expenses: Not hard at all  ?Food Insecurity: No Food Insecurity  ? Worried About Charity fundraiser in the Last Year: Never true  ? Ran Out of Food in the Last Year: Never true  ?Transportation Needs: No Transportation Needs  ? Lack of Transportation (Medical): No  ? Lack of Transportation (Non-Medical): No  ?Physical Activity: Inactive  ? Days of Exercise per Week: 0 days  ? Minutes of  Exercise per Session: 0 min  ?Stress: No Stress Concern Present  ? Feeling of Stress : Not at all  ?Social Connections: Not on file  ?Intimate Partner Violence: Not At Risk  ? Fear of Current or Ex-Partner: No  ? Emotionally Abused: No  ? Physically Abused: No  ? Sexually Abused: No  ? ? ?Outpatient Medications Prior to Visit  ?Medication Sig Dispense Refill  ? acetaminophen (TYLENOL) 500 MG tablet Take 1,000 mg by mouth daily as needed for headache.    ? atorvastatin (LIPITOR) 10 MG tablet Take 1 tablet (10 mg total) by mouth daily. In the evening 90 tablet 3  ? gemfibrozil (LOPID) 600 MG tablet Take 1 tablet (600 mg total) by mouth 2 (two) times daily. 180 tablet 3  ? metFORMIN (GLUCOPHAGE-XR) 500 MG 24 hr tablet Take 2 tablets (1,000 mg total) by mouth daily with breakfast. 180 tablet 3  ? midodrine (PROAMATINE) 5 MG tablet Take 1 tablet (5 mg total) by  mouth 3 (three) times daily with meals. 90 tablet 0  ? pantoprazole (PROTONIX) 40 MG tablet Take 1 tablet (40 mg total) by mouth daily. 30 tablet 1  ? ?No facility-administered medications prior to visit.  ? ? ?No Known Allergies ? ?ROS ?Review of Systems  ?Constitutional:  Negative for chills, fatigue, fever and unexpected weight change.  ?Eyes:  Negative for visual disturbance.  ?Respiratory:  Negative for shortness of breath.   ?Cardiovascular:  Negative for chest pain, palpitations and leg swelling.  ?Gastrointestinal:  Negative for abdominal pain.  ?Genitourinary:  Positive for frequency. Negative for difficulty urinating, flank pain, penile discharge, testicular pain and urgency.  ?Skin:  Negative for rash.  ?Neurological:  Negative for dizziness and headaches.  ?Psychiatric/Behavioral:  Positive for behavioral problems (developmental disability).   ? ?  ?Objective:  ?  ?Physical Exam ?Constitutional:   ?   General: He is not in acute distress. ?   Appearance: Normal appearance. He is obese. He is not ill-appearing, toxic-appearing or diaphoretic.  ?HENT:  ?   Head: Normocephalic.  ?   Right Ear: Tympanic membrane normal.  ?   Left Ear: Tympanic membrane normal.  ?   Nose: Nose normal.  ?   Mouth/Throat:  ?   Mouth: Mucous membranes are moist.  ?Eyes:  ?   Pupils: Pupils are equal, round, and reactive to light.  ?Cardiovascular:  ?   Rate and Rhythm: Normal rate and regular rhythm.  ?   Pulses: Normal pulses.  ?   Heart sounds: Normal heart sounds.  ?Pulmonary:  ?   Effort: Pulmonary effort is normal.  ?   Breath sounds: Normal breath sounds.  ?Abdominal:  ?   General: Abdomen is flat.  ?   Tenderness: There is no abdominal tenderness.  ?Musculoskeletal:     ?   General: Normal range of motion.  ?   Cervical back: Normal range of motion.  ?Skin: ?   General: Skin is warm.  ?Neurological:  ?   Mental Status: He is alert. Mental status is at baseline. He is disoriented.  ?   Sensory: No sensory deficit.   ?Psychiatric:     ?   Mood and Affect: Mood normal.     ?   Speech: Speech is delayed.     ?   Behavior: Behavior is slowed.  ? ? ?BP 98/82   Pulse 64   Temp (!) 96.8 ?F (36 ?C)   Resp 16   Ht 5' 6.5" (  1.689 m)   Wt 191 lb 5 oz (86.8 kg)   SpO2 98%   BMI 30.42 kg/m?  ?Wt Readings from Last 3 Encounters:  ?08/09/21 191 lb 5 oz (86.8 kg)  ?08/02/21 193 lb (87.5 kg)  ?07/25/21 198 lb 6.4 oz (90 kg)  ? ? ? ?Health Maintenance Due  ?Topic Date Due  ? URINE MICROALBUMIN  Never done  ? Zoster Vaccines- Shingrix (1 of 2) Never done  ? OPHTHALMOLOGY EXAM  02/25/2020  ? COVID-19 Vaccine (4 - Booster for Pfizer series) 06/29/2020  ? ? ?There are no preventive care reminders to display for this patient. ? ?Lab Results  ?Component Value Date  ? TSH 3.819 07/15/2021  ? ?Lab Results  ?Component Value Date  ? WBC 3.1 (L) 07/25/2021  ? HGB 10.5 (L) 07/25/2021  ? HCT 30.7 (L) 07/25/2021  ? MCV 91.9 07/25/2021  ? PLT 393.0 07/25/2021  ? ?Lab Results  ?Component Value Date  ? NA 139 07/25/2021  ? K 4.2 07/25/2021  ? CO2 29 07/25/2021  ? GLUCOSE 109 (H) 07/25/2021  ? BUN 16 07/25/2021  ? CREATININE 1.20 07/25/2021  ? BILITOT 0.3 07/25/2021  ? ALKPHOS 81 07/25/2021  ? AST 27 07/25/2021  ? ALT 25 07/25/2021  ? PROT 6.9 07/25/2021  ? ALBUMIN 3.9 07/25/2021  ? CALCIUM 9.5 07/25/2021  ? ANIONGAP 8 07/18/2021  ? GFR 60.83 07/25/2021  ? ?Lab Results  ?Component Value Date  ? HGBA1C 6.4 05/17/2021  ? ? ?  ?Assessment & Plan:  ? ?Problem List Items Addressed This Visit   ?None ? ? ?No orders of the defined types were placed in this encounter. ? ? ?Follow-up: No follow-ups on file.  ? ? ?Eugenia Pancoast, FNP ?

## 2021-08-10 DIAGNOSIS — R350 Frequency of micturition: Secondary | ICD-10-CM | POA: Diagnosis not present

## 2021-08-10 LAB — POCT URINALYSIS DIPSTICK
Bilirubin, UA: NEGATIVE
Blood, UA: NEGATIVE
Glucose, UA: NEGATIVE
Ketones, UA: NEGATIVE
Leukocytes, UA: NEGATIVE
Nitrite, UA: NEGATIVE
Protein, UA: NEGATIVE
Spec Grav, UA: 1.015 (ref 1.010–1.025)
Urobilinogen, UA: 0.2 E.U./dL
pH, UA: 6 (ref 5.0–8.0)

## 2021-08-10 NOTE — Progress Notes (Signed)
?Cardiology Office Note:   ? ?Date:  08/11/2021  ? ?ID:  Daniel Bailey., DOB 08/18/1950, MRN [id number] ? ?PCP:  [name], MD  ? ?[facility] Providers ?Cardiologist:  Lenna Sciara, MD ?Referring MD: Eugenia Pancoast, [place]  ? ?Chief Complaint/Reason for Referral: Bradycardia ? ?ASSESSMENT:   ? ?1. Bifascicular block   ?2. Bradycardia   ?3. Aortic atherosclerosis ([place])   ?4. Type 2 diabetes mellitus without complication, without long-term current use of insulin ([place])   ?5. Hypertension associated with diabetes ([place])   ?6. Hyperlipidemia associated with type 2 diabetes mellitus ([place])   ? ? ?PLAN:   ? ?In order of problems listed above: ?1.  The patient is relatively asymptomatic and he is not bradycardic today, monitor for symptoms.  Will obtain an echocardiogram to evaluate for.  If he develops symptoms then perhaps pacemaker may need to be considered. ?2.  See discussion above ?3.  Consider starting aspirin after GI evaluation, continue Lipitor with goal LDL less than 70 ?4.  Start aspirin, continue Lipitor, consider SGLT2 inhibitor, and patient is not on ACE inhibitor or ARB due to low blood pressures or requiring midodrine. ?5.  On midodrine.  No antihypertensives prescribed. ?6.  Continue statin with goal LDL of less than 70.  This is being managed by the patient's primary care provider. ? ? ? ?Dispo:  Return in about 1 year (around 08/12/2022).  ? ?  ? ?Medication Adjustments/Labs and Tests Ordered: ?Current medicines are reviewed at length with the patient today.  Concerns regarding medicines are outlined above. ? ?The following changes have been made:  no change  ? ?Labs/tests ordered: ?Orders Placed This Encounter  ?Procedures  ? ECHOCARDIOGRAM COMPLETE  ? ? ?Medication Changes: ?No orders of the defined types were placed in this encounter. ? ? ? ?Current medicines are reviewed at length with the patient today.  The patient does not have concerns regarding medicines. ? ? ?History of Present Illness:    ? ?FOCUSED PROBLEM LIST:   ?1.  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  Aortic atherosclerosis on CT scan of abdomen 2023 ?3.  Hypertension ?4.  Hyperlipidemia ?5.  Type 2 diabetes on metformin ?6.  Obstructive sleep apnea ? ?The patient is a 71 y.o. male with the indicated medical history here for recommendations regarding bradycardia.  The patient was seen by his primary care provider recently.  He was noted to have a pulse in the high 30s or low 40s.  He was relatively asymptomatic denying dizziness, palpitations, chest pain or shortness of breath.  A TSH had been drawn recently which was within normal limits.  His other laboratories were also fairly unremarkable. ? ?The patient had a hospitalization for an aspiration pneumonia.  His family member tells me that he had an episode of GI bleeding and is supposed to see gastroenterology in the near future.  He is rather low functioning and does not do much physically.  His family mother tells me he has had no presyncope or syncope.  He denies any shortness of breath or chest pain.  He has had no peripheral edema or paroxysmal nocturnal dyspnea.  He is otherwise without significant complaints. ? ? ?Current Medications: ?Current Meds  ?Medication Sig  ? acetaminophen (TYLENOL) 500 MG tablet Take 1,000 mg by mouth daily as needed for headache.  ? atorvastatin (LIPITOR) 10 MG tablet Take 1 tablet (10 mg total) by mouth daily. In the evening  ? gemfibrozil (  LOPID) 600 MG tablet Take 1 tablet (600 mg total) by mouth 2 (two) times daily.  ? metFORMIN (GLUCOPHAGE-XR) 500 MG 24 hr tablet Take 2 tablets (1,000 mg total) by mouth daily with breakfast.  ? midodrine (PROAMATINE) 5 MG tablet Take 1 tablet (5 mg total) by mouth 3 (three) times daily with meals.  ? pantoprazole (PROTONIX) 40 MG tablet Take 1 tablet (40 mg total) by mouth daily.  ?  ? ?Allergies:    ?Patient has no known allergies.  ? ?Social History:   ?Social History   ? ?Tobacco Use  ? Smoking status: Never  ? Smokeless tobacco: Never  ?Vaping Use  ? Vaping Use: Never used  ?Substance Use Topics  ? Alcohol use: No  ?  Alcohol/week: 0.0 standard drinks  ? Drug use: No  ?  ? ?Family Hx: ?Family History  ?Problem Relation Age of Onset  ? Colon cancer Mother   ? Kidney disease Mother   ? Cancer Father   ?     unknown  ?  ? ?Review of Systems:   ?Please see the history of present illness.    ?All other systems reviewed and are negative. ?  ? ? ?EKGs/Labs/Other Test Reviewed:   ? ?EKG:  Previous ekg performed on August 09 2021 that I personally reviewed demonstrates sinus bradycardia with first-degree AV block and right bundle branch block ? ?Prior CV studies: ?None available ? ?Other studies Reviewed: ?Review of the additional studies/records demonstrates:  ? ?CT chest abdomen pelvis 2023 back coronary artery calcification but with atheromatous calcification of the aorta.  ? ? ?Recent Labs: ?07/15/2021: TSH 3.819 ?07/18/2021: Magnesium 2.0 ?07/25/2021: ALT 25; BUN 16; Creatinine, Ser 1.20; Hemoglobin 10.5; Platelets 393.0; Potassium 4.2; Sodium 139  ? ?Recent Lipid Panel ?Lab Results  ?Component Value Date/Time  ? CHOL 130 07/16/2020 12:38 PM  ? TRIG 110.0 07/16/2020 12:38 PM  ? HDL 51.60 07/16/2020 12:38 PM  ? LDLCALC 56 07/16/2020 12:38 PM  ? LDLDIRECT 96.0 07/28/2019 04:31 PM  ? ? ?Risk Assessment/Calculations:   ? ? ?    ? ?Physical Exam:   ? ?VS:  BP (!) 112/52   Pulse 67   Ht 5' 6.5" (1.689 m)   Wt 178 lb (80.7 kg)   SpO2 96%   BMI 28.30 kg/m?    ?Wt Readings from Last 3 Encounters:  ?08/11/21 178 lb (80.7 kg)  ?08/09/21 191 lb 5 oz (86.8 kg)  ?08/02/21 193 lb (87.5 kg)  ?  ?GENERAL:  No apparent distress, AOx3 ?HEENT:  No carotid bruits, +2 carotid impulses, no scleral icterus ?CAR: RRR  no murmurs, gallops, rubs, or thrills ?RES:  Clear to auscultation bilaterally ?ABD:  Soft, nontender, nondistended, positive bowel sounds x 4 ?VASC:  +2 radial pulses, +2 carotid pulses,  palpable pedal pulses ?NEURO:  CN 2-12 grossly intact; motor and sensory grossly intact ?PSYCH:  No active depression or anxiety ?EXT:  No edema, ecchymosis, or cyanosis ? ?Signed, ?Early Osmond, MD  ?08/11/2021 4:59 PM    ?[place] [address], [place], [place]  [address] ?Phone: [phone]; Fax: [phone]  ? ?Note:  This document was prepared using Dragon voice recognition software and may include unintentional dictation errors. ?

## 2021-08-11 ENCOUNTER — Encounter: Payer: Self-pay | Admitting: Internal Medicine

## 2021-08-11 ENCOUNTER — Ambulatory Visit (INDEPENDENT_AMBULATORY_CARE_PROVIDER_SITE_OTHER): Payer: Medicare Other | Admitting: Internal Medicine

## 2021-08-11 VITALS — BP 112/52 | HR 67 | Ht 66.5 in | Wt 178.0 lb

## 2021-08-11 DIAGNOSIS — E119 Type 2 diabetes mellitus without complications: Secondary | ICD-10-CM

## 2021-08-11 DIAGNOSIS — I700 Atherosclerosis of aorta: Secondary | ICD-10-CM

## 2021-08-11 DIAGNOSIS — I152 Hypertension secondary to endocrine disorders: Secondary | ICD-10-CM

## 2021-08-11 DIAGNOSIS — E785 Hyperlipidemia, unspecified: Secondary | ICD-10-CM

## 2021-08-11 DIAGNOSIS — E1159 Type 2 diabetes mellitus with other circulatory complications: Secondary | ICD-10-CM | POA: Diagnosis not present

## 2021-08-11 DIAGNOSIS — I452 Bifascicular block: Secondary | ICD-10-CM | POA: Diagnosis not present

## 2021-08-11 DIAGNOSIS — R001 Bradycardia, unspecified: Secondary | ICD-10-CM

## 2021-08-11 DIAGNOSIS — E1169 Type 2 diabetes mellitus with other specified complication: Secondary | ICD-10-CM

## 2021-08-11 LAB — URINE CULTURE
MICRO NUMBER:: 13226045
SPECIMEN QUALITY:: ADEQUATE

## 2021-08-11 NOTE — Patient Instructions (Signed)
Medication Instructions:  ?Your physician recommends that you continue on your current medications as directed. Please refer to the Current Medication list given to you today. ? ?*If you need a refill on your cardiac medications before your next appointment, please call your pharmacy* ? ?Lab Work: ?If you have labs (blood work) drawn today and your tests are completely normal, you will receive your results only by: ?MyChart Message (if you have MyChart) OR ?A paper copy in the mail ?If you have any lab test that is abnormal or we need to change your treatment, we will call you to review the results. ? ?Testing/Procedures: ?Your physician has requested that you have an echocardiogram. Echocardiography is a painless test that uses sound waves to create images of your heart. It provides your doctor with information about the size and shape of your heart and how well your heart?s chambers and valves are working. This procedure takes approximately one hour. There are no restrictions for this procedure. ? ?Follow-Up: ?At [facility], you and your health needs are our priority.  As part of our continuing mission to provide you with exceptional heart care, we have created designated Provider Care Teams.  These Care Teams include your primary Cardiologist (physician) and Advanced Practice Providers (APPs -  Physician Assistants and Nurse Practitioners) who all work together to provide you with the care you need, when you need it. ? ?We recommend signing up for the patient portal called "MyChart".  Sign up information is provided on this After Visit Summary.  MyChart is used to connect with patients for Virtual Visits (Telemedicine).  Patients are able to view lab/test results, encounter notes, upcoming appointments, etc.  Non-urgent messages can be sent to your provider as well.   ?To learn more about what you can do with MyChart, go to [email or web address].   ? ?Your next appointment:   ?1 year(s) ? ?The format for  your next appointment:   ?In Person ? ?Provider:   ?Early Osmond, MD { ? ?I ?

## 2021-08-12 DIAGNOSIS — I4510 Unspecified right bundle-branch block: Secondary | ICD-10-CM | POA: Insufficient documentation

## 2021-08-12 NOTE — Assessment & Plan Note (Signed)
Urine culture ordered pending results 

## 2021-08-12 NOTE — Assessment & Plan Note (Signed)
Spoke with caregiver, cousin as well as cousins mom and advised them need to f/u with pulmonary for repeat CXR ?They stated they will schedule f/u appt.  ?

## 2021-08-12 NOTE — Assessment & Plan Note (Signed)
ekg ordered and reviewed ?RVH, bradycardia and elevated pr interval.  ?Urgent referral placed for cardiologist ?D/w with cousin accompanying that if any increased fatigue, lethargy, change in mental status or chest pain pt needs to go to er and or call 911. ?

## 2021-08-16 ENCOUNTER — Telehealth: Payer: Self-pay

## 2021-08-16 DIAGNOSIS — A419 Sepsis, unspecified organism: Secondary | ICD-10-CM | POA: Diagnosis not present

## 2021-08-16 DIAGNOSIS — G9341 Metabolic encephalopathy: Secondary | ICD-10-CM | POA: Diagnosis not present

## 2021-08-16 DIAGNOSIS — J9601 Acute respiratory failure with hypoxia: Secondary | ICD-10-CM | POA: Diagnosis not present

## 2021-08-16 DIAGNOSIS — N179 Acute kidney failure, unspecified: Secondary | ICD-10-CM | POA: Diagnosis not present

## 2021-08-16 DIAGNOSIS — G4733 Obstructive sleep apnea (adult) (pediatric): Secondary | ICD-10-CM | POA: Diagnosis not present

## 2021-08-16 DIAGNOSIS — J690 Pneumonitis due to inhalation of food and vomit: Secondary | ICD-10-CM | POA: Diagnosis not present

## 2021-08-16 NOTE — Telephone Encounter (Signed)
Called and spoke to Daniel Bailey on behalf of Daniel Bailey. She stated that she understood the results that was given. ?

## 2021-08-16 NOTE — Telephone Encounter (Signed)
Left message to return call to our office.  

## 2021-08-16 NOTE — Telephone Encounter (Signed)
Received call back did not see open message to give patient.Marland Kitchen  ?

## 2021-08-19 DIAGNOSIS — G4733 Obstructive sleep apnea (adult) (pediatric): Secondary | ICD-10-CM | POA: Diagnosis not present

## 2021-08-19 DIAGNOSIS — D649 Anemia, unspecified: Secondary | ICD-10-CM | POA: Diagnosis not present

## 2021-08-19 DIAGNOSIS — Z7984 Long term (current) use of oral hypoglycemic drugs: Secondary | ICD-10-CM | POA: Diagnosis not present

## 2021-08-19 DIAGNOSIS — E669 Obesity, unspecified: Secondary | ICD-10-CM | POA: Diagnosis not present

## 2021-08-19 DIAGNOSIS — G9341 Metabolic encephalopathy: Secondary | ICD-10-CM | POA: Diagnosis not present

## 2021-08-19 DIAGNOSIS — J9601 Acute respiratory failure with hypoxia: Secondary | ICD-10-CM | POA: Diagnosis not present

## 2021-08-19 DIAGNOSIS — A419 Sepsis, unspecified organism: Secondary | ICD-10-CM | POA: Diagnosis not present

## 2021-08-19 DIAGNOSIS — I959 Hypotension, unspecified: Secondary | ICD-10-CM | POA: Diagnosis not present

## 2021-08-19 DIAGNOSIS — J690 Pneumonitis due to inhalation of food and vomit: Secondary | ICD-10-CM | POA: Diagnosis not present

## 2021-08-19 DIAGNOSIS — I10 Essential (primary) hypertension: Secondary | ICD-10-CM | POA: Diagnosis not present

## 2021-08-19 DIAGNOSIS — E119 Type 2 diabetes mellitus without complications: Secondary | ICD-10-CM | POA: Diagnosis not present

## 2021-08-19 DIAGNOSIS — N179 Acute kidney failure, unspecified: Secondary | ICD-10-CM | POA: Diagnosis not present

## 2021-08-19 DIAGNOSIS — G3184 Mild cognitive impairment, so stated: Secondary | ICD-10-CM | POA: Diagnosis not present

## 2021-08-19 DIAGNOSIS — H409 Unspecified glaucoma: Secondary | ICD-10-CM | POA: Diagnosis not present

## 2021-08-26 ENCOUNTER — Ambulatory Visit (HOSPITAL_COMMUNITY): Payer: Medicare Other | Attending: Internal Medicine

## 2021-08-26 DIAGNOSIS — I700 Atherosclerosis of aorta: Secondary | ICD-10-CM | POA: Insufficient documentation

## 2021-08-26 DIAGNOSIS — E1159 Type 2 diabetes mellitus with other circulatory complications: Secondary | ICD-10-CM | POA: Diagnosis not present

## 2021-08-26 DIAGNOSIS — I152 Hypertension secondary to endocrine disorders: Secondary | ICD-10-CM | POA: Insufficient documentation

## 2021-08-26 DIAGNOSIS — I452 Bifascicular block: Secondary | ICD-10-CM

## 2021-08-26 DIAGNOSIS — E785 Hyperlipidemia, unspecified: Secondary | ICD-10-CM | POA: Insufficient documentation

## 2021-08-26 DIAGNOSIS — E1169 Type 2 diabetes mellitus with other specified complication: Secondary | ICD-10-CM | POA: Diagnosis not present

## 2021-08-26 DIAGNOSIS — R001 Bradycardia, unspecified: Secondary | ICD-10-CM

## 2021-08-26 DIAGNOSIS — E119 Type 2 diabetes mellitus without complications: Secondary | ICD-10-CM | POA: Insufficient documentation

## 2021-08-26 LAB — ECHOCARDIOGRAM COMPLETE
Area-P 1/2: 1.28 cm2
P 1/2 time: 506 msec
S' Lateral: 3.2 cm

## 2021-08-28 ENCOUNTER — Encounter: Payer: Self-pay | Admitting: Family Medicine

## 2021-08-29 MED ORDER — MIDODRINE HCL 5 MG PO TABS: 5.0000 mg | ORAL_TABLET | Freq: Three times a day (TID) | ORAL | 2 refills | Status: DC

## 2021-08-31 ENCOUNTER — Telehealth: Payer: Self-pay | Admitting: Family Medicine

## 2021-08-31 DIAGNOSIS — A419 Sepsis, unspecified organism: Secondary | ICD-10-CM | POA: Diagnosis not present

## 2021-08-31 DIAGNOSIS — G9341 Metabolic encephalopathy: Secondary | ICD-10-CM | POA: Diagnosis not present

## 2021-08-31 DIAGNOSIS — J690 Pneumonitis due to inhalation of food and vomit: Secondary | ICD-10-CM | POA: Diagnosis not present

## 2021-08-31 DIAGNOSIS — G4733 Obstructive sleep apnea (adult) (pediatric): Secondary | ICD-10-CM | POA: Diagnosis not present

## 2021-08-31 DIAGNOSIS — J9601 Acute respiratory failure with hypoxia: Secondary | ICD-10-CM | POA: Diagnosis not present

## 2021-08-31 DIAGNOSIS — N179 Acute kidney failure, unspecified: Secondary | ICD-10-CM | POA: Diagnosis not present

## 2021-08-31 NOTE — Telephone Encounter (Signed)
Called home # and no answer, called the # listed for the PT's call back # and it was a dental office  ?

## 2021-08-31 NOTE — Telephone Encounter (Signed)
Thanks for the heads up, he has a lot going on.  I would like to know if his bp is low- please check in with [name] when you can  ?

## 2021-08-31 NOTE — Telephone Encounter (Signed)
Physical therapist Marzetta Board from [facility] called in to make Dr [name] aware that pt became dizzy and unsteady . And will not be D/C from therapy #[phone] ?

## 2021-09-01 ENCOUNTER — Encounter: Payer: Self-pay | Admitting: Family Medicine

## 2021-09-02 ENCOUNTER — Other Ambulatory Visit: Payer: Self-pay

## 2021-09-02 ENCOUNTER — Encounter (HOSPITAL_COMMUNITY): Payer: Self-pay | Admitting: Family Medicine

## 2021-09-02 ENCOUNTER — Inpatient Hospital Stay (HOSPITAL_COMMUNITY)
Admission: EM | Admit: 2021-09-02 | Discharge: 2021-09-07 | DRG: 177 | Disposition: A | Discharge status: Rehabilitation Facility | Payer: Medicare Other | Attending: Internal Medicine | Admitting: Internal Medicine

## 2021-09-02 ENCOUNTER — Observation Stay (HOSPITAL_COMMUNITY): Payer: Medicare Other

## 2021-09-02 ENCOUNTER — Emergency Department (HOSPITAL_COMMUNITY): Payer: Medicare Other

## 2021-09-02 ENCOUNTER — Observation Stay (HOSPITAL_COMMUNITY)
Admit: 2021-09-02 | Discharge: 2021-09-02 | Disposition: A | Discharge status: Home / Self Care | Payer: Medicare Other | Attending: Family Medicine | Admitting: Family Medicine

## 2021-09-02 ENCOUNTER — Telehealth: Payer: Self-pay

## 2021-09-02 DIAGNOSIS — E1122 Type 2 diabetes mellitus with diabetic chronic kidney disease: Secondary | ICD-10-CM | POA: Diagnosis present

## 2021-09-02 DIAGNOSIS — I959 Hypotension, unspecified: Secondary | ICD-10-CM | POA: Diagnosis not present

## 2021-09-02 DIAGNOSIS — G9340 Encephalopathy, unspecified: Secondary | ICD-10-CM | POA: Diagnosis present

## 2021-09-02 DIAGNOSIS — D649 Anemia, unspecified: Secondary | ICD-10-CM | POA: Diagnosis not present

## 2021-09-02 DIAGNOSIS — N1831 Chronic kidney disease, stage 3a: Secondary | ICD-10-CM | POA: Diagnosis present

## 2021-09-02 DIAGNOSIS — R4182 Altered mental status, unspecified: Secondary | ICD-10-CM | POA: Diagnosis not present

## 2021-09-02 DIAGNOSIS — R680 Hypothermia, not associated with low environmental temperature: Secondary | ICD-10-CM | POA: Diagnosis not present

## 2021-09-02 DIAGNOSIS — Z841 Family history of disorders of kidney and ureter: Secondary | ICD-10-CM | POA: Diagnosis not present

## 2021-09-02 DIAGNOSIS — E1169 Type 2 diabetes mellitus with other specified complication: Secondary | ICD-10-CM | POA: Diagnosis present

## 2021-09-02 DIAGNOSIS — R4701 Aphasia: Secondary | ICD-10-CM | POA: Diagnosis present

## 2021-09-02 DIAGNOSIS — I452 Bifascicular block: Secondary | ICD-10-CM | POA: Diagnosis not present

## 2021-09-02 DIAGNOSIS — D631 Anemia in chronic kidney disease: Secondary | ICD-10-CM | POA: Diagnosis not present

## 2021-09-02 DIAGNOSIS — K219 Gastro-esophageal reflux disease without esophagitis: Secondary | ICD-10-CM | POA: Diagnosis not present

## 2021-09-02 DIAGNOSIS — Z79899 Other long term (current) drug therapy: Secondary | ICD-10-CM | POA: Diagnosis not present

## 2021-09-02 DIAGNOSIS — I700 Atherosclerosis of aorta: Secondary | ICD-10-CM | POA: Diagnosis present

## 2021-09-02 DIAGNOSIS — I129 Hypertensive chronic kidney disease with stage 1 through stage 4 chronic kidney disease, or unspecified chronic kidney disease: Secondary | ICD-10-CM | POA: Diagnosis present

## 2021-09-02 DIAGNOSIS — Z7984 Long term (current) use of oral hypoglycemic drugs: Secondary | ICD-10-CM | POA: Diagnosis not present

## 2021-09-02 DIAGNOSIS — D509 Iron deficiency anemia, unspecified: Secondary | ICD-10-CM | POA: Diagnosis present

## 2021-09-02 DIAGNOSIS — G9341 Metabolic encephalopathy: Secondary | ICD-10-CM | POA: Diagnosis not present

## 2021-09-02 DIAGNOSIS — E782 Mixed hyperlipidemia: Secondary | ICD-10-CM | POA: Diagnosis not present

## 2021-09-02 DIAGNOSIS — E785 Hyperlipidemia, unspecified: Secondary | ICD-10-CM | POA: Diagnosis not present

## 2021-09-02 DIAGNOSIS — R001 Bradycardia, unspecified: Secondary | ICD-10-CM | POA: Diagnosis not present

## 2021-09-02 DIAGNOSIS — Z8616 Personal history of COVID-19: Secondary | ICD-10-CM

## 2021-09-02 DIAGNOSIS — Z515 Encounter for palliative care: Secondary | ICD-10-CM | POA: Diagnosis not present

## 2021-09-02 DIAGNOSIS — F79 Unspecified intellectual disabilities: Secondary | ICD-10-CM | POA: Diagnosis not present

## 2021-09-02 DIAGNOSIS — R404 Transient alteration of awareness: Secondary | ICD-10-CM | POA: Diagnosis not present

## 2021-09-02 DIAGNOSIS — R059 Cough, unspecified: Secondary | ICD-10-CM | POA: Diagnosis not present

## 2021-09-02 DIAGNOSIS — R000 Tachycardia, unspecified: Secondary | ICD-10-CM | POA: Diagnosis not present

## 2021-09-02 DIAGNOSIS — Z7189 Other specified counseling: Secondary | ICD-10-CM

## 2021-09-02 DIAGNOSIS — I10 Essential (primary) hypertension: Secondary | ICD-10-CM | POA: Diagnosis present

## 2021-09-02 DIAGNOSIS — E872 Acidosis, unspecified: Secondary | ICD-10-CM | POA: Diagnosis present

## 2021-09-02 DIAGNOSIS — J690 Pneumonitis due to inhalation of food and vomit: Principal | ICD-10-CM | POA: Diagnosis present

## 2021-09-02 DIAGNOSIS — Z800 Family history of malignant neoplasm of digestive organs: Secondary | ICD-10-CM

## 2021-09-02 DIAGNOSIS — E11649 Type 2 diabetes mellitus with hypoglycemia without coma: Secondary | ICD-10-CM | POA: Diagnosis not present

## 2021-09-02 DIAGNOSIS — R5381 Other malaise: Secondary | ICD-10-CM | POA: Diagnosis not present

## 2021-09-02 DIAGNOSIS — G4733 Obstructive sleep apnea (adult) (pediatric): Secondary | ICD-10-CM | POA: Diagnosis present

## 2021-09-02 DIAGNOSIS — E8809 Other disorders of plasma-protein metabolism, not elsewhere classified: Secondary | ICD-10-CM | POA: Diagnosis not present

## 2021-09-02 DIAGNOSIS — N179 Acute kidney failure, unspecified: Secondary | ICD-10-CM | POA: Diagnosis present

## 2021-09-02 DIAGNOSIS — Z66 Do not resuscitate: Secondary | ICD-10-CM | POA: Diagnosis not present

## 2021-09-02 DIAGNOSIS — Z8701 Personal history of pneumonia (recurrent): Secondary | ICD-10-CM | POA: Diagnosis not present

## 2021-09-02 DIAGNOSIS — R1310 Dysphagia, unspecified: Secondary | ICD-10-CM | POA: Diagnosis not present

## 2021-09-02 DIAGNOSIS — R1312 Dysphagia, oropharyngeal phase: Secondary | ICD-10-CM | POA: Diagnosis not present

## 2021-09-02 DIAGNOSIS — R531 Weakness: Secondary | ICD-10-CM | POA: Diagnosis not present

## 2021-09-02 DIAGNOSIS — R0902 Hypoxemia: Secondary | ICD-10-CM | POA: Diagnosis not present

## 2021-09-02 DIAGNOSIS — D696 Thrombocytopenia, unspecified: Secondary | ICD-10-CM | POA: Diagnosis present

## 2021-09-02 DIAGNOSIS — E119 Type 2 diabetes mellitus without complications: Secondary | ICD-10-CM | POA: Diagnosis not present

## 2021-09-02 DIAGNOSIS — R410 Disorientation, unspecified: Secondary | ICD-10-CM | POA: Diagnosis not present

## 2021-09-02 DIAGNOSIS — I440 Atrioventricular block, first degree: Secondary | ICD-10-CM | POA: Diagnosis present

## 2021-09-02 DIAGNOSIS — H409 Unspecified glaucoma: Secondary | ICD-10-CM | POA: Diagnosis not present

## 2021-09-02 HISTORY — DX: Encephalopathy, unspecified: G93.40

## 2021-09-02 LAB — CBC WITH DIFFERENTIAL/PLATELET
Abs Immature Granulocytes: 0.05 10*3/uL (ref 0.00–0.07)
Basophils Absolute: 0 10*3/uL (ref 0.0–0.1)
Basophils Relative: 0 %
Eosinophils Absolute: 0 10*3/uL (ref 0.0–0.5)
Eosinophils Relative: 0 %
HCT: 29.2 % — ABNORMAL LOW (ref 39.0–52.0)
Hemoglobin: 9.7 g/dL — ABNORMAL LOW (ref 13.0–17.0)
Immature Granulocytes: 0 %
Lymphocytes Relative: 4 %
Lymphs Abs: 0.5 10*3/uL — ABNORMAL LOW (ref 0.7–4.0)
MCH: 30.8 pg (ref 26.0–34.0)
MCHC: 33.2 g/dL (ref 30.0–36.0)
MCV: 92.7 fL (ref 80.0–100.0)
Monocytes Absolute: 0.2 10*3/uL (ref 0.1–1.0)
Monocytes Relative: 2 %
Neutro Abs: 12.6 10*3/uL — ABNORMAL HIGH (ref 1.7–7.7)
Neutrophils Relative %: 94 %
Platelets: 176 10*3/uL (ref 150–400)
RBC: 3.15 MIL/uL — ABNORMAL LOW (ref 4.22–5.81)
RDW: 16.4 % — ABNORMAL HIGH (ref 11.5–15.5)
WBC: 13.4 10*3/uL — ABNORMAL HIGH (ref 4.0–10.5)
nRBC: 0 % (ref 0.0–0.2)

## 2021-09-02 LAB — AMMONIA: Ammonia: 18 umol/L (ref 9–35)

## 2021-09-02 LAB — COMPREHENSIVE METABOLIC PANEL
ALT: 15 U/L (ref 0–44)
AST: 29 U/L (ref 15–41)
Albumin: 3.1 g/dL — ABNORMAL LOW (ref 3.5–5.0)
Alkaline Phosphatase: 96 U/L (ref 38–126)
Anion gap: 10 (ref 5–15)
BUN: 18 mg/dL (ref 8–23)
CO2: 21 mmol/L — ABNORMAL LOW (ref 22–32)
Calcium: 9.7 mg/dL (ref 8.9–10.3)
Chloride: 105 mmol/L (ref 98–111)
Creatinine, Ser: 1.48 mg/dL — ABNORMAL HIGH (ref 0.61–1.24)
GFR, Estimated: 50 mL/min — ABNORMAL LOW (ref >60)
Glucose, Bld: 84 mg/dL (ref 70–99)
Potassium: 4.8 mmol/L (ref 3.5–5.1)
Sodium: 136 mmol/L (ref 135–145)
Total Bilirubin: 0.6 mg/dL (ref 0.3–1.2)
Total Protein: 6.9 g/dL (ref 6.5–8.1)

## 2021-09-02 LAB — URINALYSIS, ROUTINE W REFLEX MICROSCOPIC
Bacteria, UA: NONE SEEN
Bilirubin Urine: NEGATIVE
Glucose, UA: NEGATIVE mg/dL
Ketones, ur: NEGATIVE mg/dL
Leukocytes,Ua: NEGATIVE
Nitrite: NEGATIVE
Protein, ur: 30 mg/dL — AB
Specific Gravity, Urine: 1.006 (ref 1.005–1.030)
pH: 5 (ref 5.0–8.0)

## 2021-09-02 LAB — LACTIC ACID, PLASMA: Lactic Acid, Venous: 0.8 mmol/L (ref 0.5–1.9)

## 2021-09-02 LAB — TSH: TSH: 2.959 u[IU]/mL (ref 0.350–4.500)

## 2021-09-02 LAB — MAGNESIUM: Magnesium: 1.7 mg/dL (ref 1.7–2.4)

## 2021-09-02 LAB — GLUCOSE, CAPILLARY: Glucose-Capillary: 83 mg/dL (ref 70–99)

## 2021-09-02 LAB — PROCALCITONIN: Procalcitonin: 0.39 ng/mL

## 2021-09-02 MED ORDER — PANTOPRAZOLE SODIUM 40 MG PO TBEC
40.0000 mg | DELAYED_RELEASE_TABLET | Freq: Every day | ORAL | Status: DC
  Administered 2021-09-03: 40 mg via ORAL
  Filled 2021-09-02: qty 1

## 2021-09-02 MED ORDER — ACETAMINOPHEN 650 MG RE SUPP: 650.0000 mg | Freq: Four times a day (QID) | RECTAL | Status: DC | PRN

## 2021-09-02 MED ORDER — SODIUM CHLORIDE 0.9 % IV SOLN
500.0000 mg | Freq: Once | INTRAVENOUS | Status: AC
  Administered 2021-09-02: 500 mg via INTRAVENOUS
  Filled 2021-09-02: qty 5

## 2021-09-02 MED ORDER — SODIUM CHLORIDE 0.9 % IV SOLN
1.0000 g | INTRAVENOUS | Status: AC
  Administered 2021-09-03 – 2021-09-06 (×4): 1 g via INTRAVENOUS
  Filled 2021-09-02 (×4): qty 10

## 2021-09-02 MED ORDER — AZITHROMYCIN 500 MG PO TABS
500.0000 mg | ORAL_TABLET | Freq: Every day | ORAL | Status: DC
  Filled 2021-09-02: qty 1

## 2021-09-02 MED ORDER — ONDANSETRON HCL 4 MG PO TABS: 4.0000 mg | ORAL_TABLET | Freq: Four times a day (QID) | ORAL | Status: DC | PRN

## 2021-09-02 MED ORDER — ATORVASTATIN CALCIUM 10 MG PO TABS
10.0000 mg | ORAL_TABLET | Freq: Every day | ORAL | Status: DC
  Administered 2021-09-03: 10 mg via ORAL
  Filled 2021-09-02: qty 1

## 2021-09-02 MED ORDER — ONDANSETRON HCL 4 MG/2ML IJ SOLN: 4.0000 mg | Freq: Four times a day (QID) | INTRAMUSCULAR | Status: DC | PRN

## 2021-09-02 MED ORDER — LACTATED RINGERS IV SOLN: INTRAVENOUS | Status: DC

## 2021-09-02 MED ORDER — MIDODRINE HCL 5 MG PO TABS
5.0000 mg | ORAL_TABLET | Freq: Three times a day (TID) | ORAL | Status: DC
  Administered 2021-09-03 – 2021-09-07 (×10): 5 mg via ORAL
  Filled 2021-09-02 (×11): qty 1

## 2021-09-02 MED ORDER — STERILE WATER FOR INJECTION IJ SOLN
INTRAMUSCULAR | Status: AC
  Administered 2021-09-02: 1 mL
  Filled 2021-09-02: qty 10

## 2021-09-02 MED ORDER — INSULIN ASPART 100 UNIT/ML IJ SOLN: 0.0000 [IU] | Freq: Three times a day (TID) | INTRAMUSCULAR | Status: DC

## 2021-09-02 MED ORDER — ZIPRASIDONE MESYLATE 20 MG IM SOLR
10.0000 mg | Freq: Once | INTRAMUSCULAR | Status: AC
  Administered 2021-09-02: 10 mg via INTRAMUSCULAR
  Filled 2021-09-02: qty 20

## 2021-09-02 MED ORDER — SODIUM CHLORIDE 0.9 % IV SOLN
1.0000 g | Freq: Once | INTRAVENOUS | Status: AC
  Administered 2021-09-02: 1 g via INTRAVENOUS
  Filled 2021-09-02: qty 10

## 2021-09-02 MED ORDER — ENOXAPARIN SODIUM 40 MG/0.4ML IJ SOSY
40.0000 mg | PREFILLED_SYRINGE | INTRAMUSCULAR | Status: DC
  Administered 2021-09-02 – 2021-09-06 (×5): 40 mg via SUBCUTANEOUS
  Filled 2021-09-02 (×5): qty 0.4

## 2021-09-02 MED ORDER — SODIUM CHLORIDE 0.9 % IV BOLUS
1000.0000 mL | Freq: Once | INTRAVENOUS | Status: AC
  Administered 2021-09-02: 1000 mL via INTRAVENOUS

## 2021-09-02 MED ORDER — SODIUM CHLORIDE 0.9% FLUSH
3.0000 mL | Freq: Two times a day (BID) | INTRAVENOUS | Status: DC
  Administered 2021-09-02 – 2021-09-04 (×4): 3 mL via INTRAVENOUS

## 2021-09-02 MED ORDER — GEMFIBROZIL 600 MG PO TABS
600.0000 mg | ORAL_TABLET | Freq: Two times a day (BID) | ORAL | Status: DC
  Administered 2021-09-03: 600 mg via ORAL
  Filled 2021-09-02 (×3): qty 1

## 2021-09-02 MED ORDER — ACETAMINOPHEN 325 MG PO TABS: 650.0000 mg | ORAL_TABLET | Freq: Four times a day (QID) | ORAL | Status: DC | PRN

## 2021-09-02 NOTE — Telephone Encounter (Signed)
Family called back and pt was triaged, pt is at ER now ?

## 2021-09-02 NOTE — Telephone Encounter (Signed)
Aware ?Will watch for correspondence  ?

## 2021-09-02 NOTE — Progress Notes (Signed)
EEG complete - results pending 

## 2021-09-02 NOTE — ED Triage Notes (Signed)
PT BIB [facility] for AMS.  Pt last known well 11am yesterday. 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  PT began having trouble getting words out and acting differently around 11am yesterday.  Pt found sitting on toilet around 4am listing to the side and drooling.  PT came in mildly combative, very fidgety and trying to disrobe. He was not really opening his eyes. ? ?EMS VSS.  PT POA is [name] [phone]  ? ? ?

## 2021-09-02 NOTE — Progress Notes (Signed)
New Admission Note:  ? ?Arrival Method: via stretcher from ED ?Mental Orientation: alert and oriented to self ?Telemetry: [phone], CCMD notified ?Assessment: to be completed ?Skin: MASD to groin area, dry and flaky ?IV: LFA, saline locked ?Pain: pt. Unable to tell ?Tubes: None ?Safety Measures: Safety Fall Prevention Plan has been discussed  ?Admission: to be completed ?[facility] [place] Orientation: Patient has been oriented to the room, unit and staff.   ?Family: legal guardian [name]) at bedside ? ?Orders to be reviewed and implemented. Will continue to monitor the patient. Call light has been placed within reach and bed alarm has been activated.  ? ?

## 2021-09-02 NOTE — ED Provider Notes (Signed)
?[facility] ?Provider Note ? ? ?CSN: [id number] ?Arrival date & time: 09/02/21  1345 ? ?  ? ?History ? ?No chief complaint on file. ? ? ?Daniel Bailey. is a 71 y.o. male. ? ?Patient presents ER chief complaint of altered behavior confusion.  Symptoms ongoing for 2 days.  Last seen normal about 2 days ago.  Family states that he was more withdrawn and less verbal and unable to say words yesterday.  He thought he would improve today but he did not need brought into the ER.  He has a history of mental delay, however he is able to hold simple conversations or repeating words and phrases but he has not been doing so.  No other report of any new fevers or cough.  No reports of any vomiting or diarrhea.  Lives at home with family otherwise. ? ? ?  ? ?Home Medications ?Prior to Admission medications   ?Medication Sig Start Date End Date Taking? Authorizing Provider  ?acetaminophen (TYLENOL) 500 MG tablet Take 1,000 mg by mouth daily as needed for headache.    [provider]  ?atorvastatin (LIPITOR) 10 MG tablet Take 1 tablet (10 mg total) by mouth daily. In the evening 08/11/20   Tower, Wynelle Fanny, MD  ?gemfibrozil (LOPID) 600 MG tablet Take 1 tablet (600 mg total) by mouth 2 (two) times daily. 08/11/20   Tower, Wynelle Fanny, MD  ?metFORMIN (GLUCOPHAGE-XR) 500 MG 24 hr tablet Take 2 tablets (1,000 mg total) by mouth daily with breakfast. 08/11/20   Tower, Wynelle Fanny, MD  ?midodrine (PROAMATINE) 5 MG tablet Take 1 tablet (5 mg total) by mouth 3 (three) times daily with meals. 08/29/21   Tower, Wynelle Fanny, MD  ?pantoprazole (PROTONIX) 40 MG tablet Take 1 tablet (40 mg total) by mouth daily. 07/19/21   Hongalgi, Lenis Dickinson, MD  ?   ? ?Allergies    ?Patient has no known allergies.   ? ?Review of Systems   ?Review of Systems  ?Unable to perform ROS: Mental status change  ? ?Physical Exam ?Updated Vital Signs ?BP 102/60   Pulse 82   Resp (!) 22   SpO2 92%  ?Physical Exam ?Constitutional:   ?    Appearance: He is well-developed.  ?HENT:  ?   Head: Normocephalic.  ?   Nose: Nose normal.  ?Eyes:  ?   Extraocular Movements: Extraocular movements intact.  ?Cardiovascular:  ?   Rate and Rhythm: Normal rate.  ?Pulmonary:  ?   Effort: Pulmonary effort is normal.  ?Skin: ?   Coloration: Skin is not jaundiced.  ?Neurological:  ?   Mental Status: He is alert.  ?   Comments: Patient appears to be moving all extremities but appears moderately agitated.  Upon repetitive requests, he can attempt to say his name but appears to have slurred speech, difficulty getting the words out.  However he is following commands such as raising up your hands or lift up your leg and is able to follow.  ? ? ?ED Results / Procedures / Treatments   ?Labs ?(all labs ordered are listed, but only abnormal results are displayed) ?Labs Reviewed  ?CBC WITH DIFFERENTIAL/PLATELET  ?COMPREHENSIVE METABOLIC PANEL  ?MAGNESIUM  ?URINALYSIS, ROUTINE W REFLEX MICROSCOPIC  ? ? ?EKG ?None ? ?Radiology ?DG Chest Port 1 View ? ?Result Date: 09/02/2021 ?CLINICAL DATA:  Cough EXAM: PORTABLE CHEST 1 VIEW COMPARISON:  July 12, 2021 and CT of the chest on the same day FINDINGS: Heart is  borderline. As before, the heart size and mediastinal contours are within normal limits. Again seen is the retrocardiac infiltrate without significant interval change. Visualized skeletal structures are unremarkable. IMPRESSION: Retrocardiac infiltrate and was seen in the previous study as well. Electronically Signed   By: Frazier Richards M.D.   On: 09/02/2021 14:32   ? ?Procedures ?Marland KitchenCritical Care ?Performed by: Luna Fuse, MD ?Authorized by: Luna Fuse, MD  ? ?Critical care provider statement:  ?  Critical care time (minutes):  30 ?  Critical care time was exclusive of:  Separately billable procedures and treating other patients and teaching time ?  Critical care was necessary to treat or prevent imminent or life-threatening deterioration of the following conditions:  CNS  failure or compromise  ? ? ?Medications Ordered in ED ?Medications  ?sodium chloride 0.9 % bolus 1,000 mL (has no administration in time range)  ?ziprasidone (GEODON) injection 10 mg (10 mg Intramuscular Given 09/02/21 1413)  ?sterile water (preservative free) injection (1 mL  Given 09/02/21 1415)  ? ? ?ED Course/ Medical Decision Making/ A&P ?  ?                        ?Medical Decision Making ?Amount and/or Complexity of Data Reviewed ?Labs: ordered. ?Radiology: ordered. ? ?Risk ?Prescription drug management. ? ? ?History obtained from family at bedside. ? ?Cardiac monitoring showing sinus rhythm. ? ?Comorbidity influencing complexity include the patient's history of mental delay. ? ?Work-up includes labs CBC chemistry.  CT imaging of the brain ordered.  However the patient is uncooperative and mild to moderately agitated.  Requiring Geodon IM. ? ?Pending resolution of work-up. ? ? ? ? ? ? ? ? ?Final Clinical Impression(s) / ED Diagnoses ?Final diagnoses:  ?Altered mental status, unspecified altered mental status type  ? ? ?Rx / DC Orders ?ED Discharge Orders   ? ? None  ? ?  ? ? ?  ?Luna Fuse, MD ?09/02/21 1515 ? ?

## 2021-09-02 NOTE — Telephone Encounter (Signed)
[facility] - Client ?TELEPHONE ADVICE RECORD ?[facility]? ?Patient ?Name: ?Daniel RUD ?Bailey ?Gender: Male ?DOB: 08/06/1950 ?Age: 71 Y 60 M 26 Bailey ?Return ?Phone ?Number: ?[phone] ?(Primary), ?[phone] ?(Secondary) ?Address: ?City/ ?State/ ?Zip: [place] ? [address] ?Client [facility] - Client ?Client Site [facility] ?Provider Tower, Roque Lias - MD ?Contact Type Call ?Who Is Calling Patient / Member / Family / Caregiver ?Call Type Triage / Clinical ?Caller Name [name] ?Return Phone Number [phone] (Primary) ?Chief Complaint CONFUSION - new onset ?Reason for Call Symptomatic / Request for Health Information ?Initial Comment Caller states her cousin has been in a confused ?state for the last few days and very fidgety and ?shaky. He is constantly removing his clothes and ?shoes. He is unable to communicate today. Has ?been confused in the past but today it is severe. ?Translation No ?Nurse Assessment ?Nurse: Fredderick Phenix, RN, Lelan Pons Date/Time Eilene Ghazi Time): 09/02/2021 12:32:07 PM ?Confirm and document reason for call. If ?symptomatic, describe symptoms. ?---Caller states her cousin has been in a confused state ?for the last few days and very fidgety and shaky. He ?is constantly removing his clothes and shoes. He is ?unable to communicate today. Has been confused in ?the past but today it is severe. ?Does the patient have any new or worsening ?symptoms? ---Yes ?Will a triage be completed? ---Yes ?Related visit to physician within the last 2 weeks? ---No ?Does the PT have any chronic conditions? (i.e. ?diabetes, asthma, this includes High risk factors for ?pregnancy, etc.) ?---Yes ?List chronic conditions. ---diabetes ?Is this a behavioral health or substance abuse call? ---No ?Guidelines ?Guideline Title Affirmed Question Affirmed Notes Nurse Date/Time (Eastern ?Time) ?Confusion - Delirium [1] Difficult to ?awaken or acting ?confused  (e.g., ?disoriented, slurred ?speech) AND [2] ?Fredderick Phenix, RN, Lelan Pons 09/02/2021 12:34:30 ?PM ?PLEASE NOTE: All timestamps contained within this report are represented as [place] Standard Time. ?CONFIDENTIALTY NOTICE: This fax transmission is intended only for the addressee. It contains information that is legally privileged, confidential or ?otherwise protected from use or disclosure. If you are not the intended recipient, you are strictly prohibited from reviewing, disclosing, copying using ?or disseminating any of this information or taking any action in reliance on or regarding this information. If you have received this fax in error, please ?notify us immediately by telephone so that we can arrange for its return to [place]. Phone: [phone], Toll-Free: [phone], Fax: [phone] ?Page: 2 of 2 ?Call Id: [id number] ?Guidelines ?Guideline Title Affirmed Question Affirmed Notes Nurse Date/Time (Eastern ?Time) ?present now AND [3] ?diabetes mellitus ?Disp. Time (Eastern ?Time) Disposition Final User ?09/02/2021 12:29:00 PM Send to Urgent Robbie Lis ?09/02/2021 12:43:52 PM 911 Outcome Documentation Fredderick Phenix, RN, Lelan Pons ?Reason: EMS is enroute ?09/02/2021 12:36:31 PM Call EMS 911 Now Yes Fredderick Phenix, RN, Lelan Pons ?Caller Disagree/Comply Comply ?Caller Understands Yes ?PreDisposition Did not know what to do ?Care Advice Given Per Guideline ?CALL EMS 911 NOW: * Immediate medical attention is needed. You need to hang up and call 911 (or an ambulance). * Triager ?Discretion: I'll call you back in a few minutes to be sure you were able to reach them. CARE ADVICE given per Confusion-Delirium ?(Adult) guideline. NOTE TO TRIAGER - FIRST AID ADVICE - GLUCOSE OR GLUCAGON: ?Referrals ?GO TO FACILITY UNDECIDE ?

## 2021-09-02 NOTE — ED Notes (Signed)
Unable to administer ordered antibiotics at this time due to patient being in MRI. ?

## 2021-09-02 NOTE — ED Provider Notes (Signed)
3:41 PM ?Care assumed from Dr. Almyra Free.  At time of transfer of care, patient is waiting for more work-up to be completed prior to likely admission.  Patient reportedly has been aphasic for the last 2 days which is not his baseline.  He is also having near hypoxia with oxygen saturations in the low 90s with continued cough. ? ?Patient is waiting for head CT initially.  Previous team suspects patient may have either pneumonia, stroke, or combination of both. ? ?Anticipate admission for further management. ? ?4:46 PM ?Patient's x-ray returned and does show evidence of persistent opacity concerning for pneumonia given the cough and chills.  Patient also has a new leukocytosis.  Family concerned about possible recurrent aspiration pneumonia.  Will order antibiotics and call for admission.  CT head did not show acute abnormality however given the reported aphasia that has been worsened for the last few days where he is now barely able to speak, will order MRI.  Patient will be admitted. ? ? ?  ?Daniel Bailey, Gwenyth Allegra, MD ?09/02/21 2116 ? ?

## 2021-09-02 NOTE — H&P (Signed)
?History and Physical  ? ? ?Patient: Daniel Bailey. WFU:[id number] DOB: 05-11-1950 ?DOA: 09/02/2021 ?DOS: the patient was seen and examined on 09/02/2021 ?PCP: [name], MD  ?Patient coming from: Home ? ?Chief Complaint:  ?Chief Complaint  ?Patient presents with  ? Altered Mental Status  ? ?HPI: Daniel Bailey. is a 71 y.o. male with medical history of intellectual disability, T2DM, HLD, anemia, recent aspiration pneumonia admission who presented with worsening alteration of mental status in the setting of recent cough. History given by guardian, pt's cousin, as pt is somnolent from receiving IM geodon earlier for agitation. He developed a cough a few days prior but had no fever or evidence of dyspnea. Then yesterday the guardian's son, with whom he lives, reported that the patient, who is normally quite verbose was not speaking very much, then was unable to even get words out to answer questions, was at times answering questions incorrectly. He was always responsive, but had bizarre behavior he doesn't usually exhibit, like sitting without bottoms on. When told to put something on, he pulls up underwear, then pulls up pants part way, then immediately starts taking it all off. He was shaking in his arms at some point but when told to stop, he was able to do so. Due to progressive changes, EMS was activated, confirmed euglycemia, and brought him to ED. He was afebrile with leukocytosis, retrocardiac infiltrate on CXR, and mild AKI. EDP reports he had stuttering speech on arrival, had difficulty saying what he seemed to want to say but had nonfocal exam. CT head normal for age. The patient became more agitated ultimately requiring IM geodon. Admission requested for pneumonia complicated by AMS.  ? ?Review of Systems: Unable to review all systems due to lack of cooperation from patient. ?Past Medical History:  ?Diagnosis Date  ? Anemia   ? Elevated PSA   ? Frequent urination   ? Glaucoma   ? History of COVID-19   ?  Hyperglycemia   ? Hyperlipidemia   ? Hypertension   ? Sleep apnea   ? ?Past Surgical History:  ?Procedure Laterality Date  ? COLONOSCOPY    ? PROSTATE BIOPSY N/A 11/20/2017  ? Procedure: BIOPSY TRANSRECTAL ULTRASONIC PROSTATE (TUBP);  Surgeon: Abbie Sons, MD;  Location: [facility] ORS;  Service: Urology;  Laterality: N/A;  ? ?Social History:  reports that he has never smoked. He has never used smokeless tobacco. He reports that he does not drink alcohol and does not use drugs. ? ?No Known Allergies ? ?Family History  ?Problem Relation Age of Onset  ? Colon cancer Mother   ? Kidney disease Mother   ? Cancer Father   ?     unknown  ? ? ?Prior to Admission medications   ?Medication Sig Start Date End Date Taking? Authorizing Provider  ?acetaminophen (TYLENOL) 500 MG tablet Take 1,000 mg by mouth daily as needed for headache.    [provider]  ?atorvastatin (LIPITOR) 10 MG tablet Take 1 tablet (10 mg total) by mouth daily. In the evening 08/11/20   Tower, Wynelle Fanny, MD  ?gemfibrozil (LOPID) 600 MG tablet Take 1 tablet (600 mg total) by mouth 2 (two) times daily. 08/11/20   Tower, Wynelle Fanny, MD  ?metFORMIN (GLUCOPHAGE-XR) 500 MG 24 hr tablet Take 2 tablets (1,000 mg total) by mouth daily with breakfast. 08/11/20   Tower, Wynelle Fanny, MD  ?midodrine (PROAMATINE) 5 MG tablet Take 1 tablet (5 mg total) by mouth 3 (three) times daily with  meals. 08/29/21   Tower, Wynelle Fanny, MD  ?pantoprazole (PROTONIX) 40 MG tablet Take 1 tablet (40 mg total) by mouth daily. 07/19/21   Hongalgi, Lenis Dickinson, MD  ? ? ?Physical Exam: ?Vitals:  ? 09/02/21 1558 09/02/21 1600 09/02/21 1630 09/02/21 1645  ?BP:  (!) 110/47 119/62   ?Pulse:  83 80 81  ?Resp:  (!) 23 20 (!) 21  ?Temp: 97.6 ?F (36.4 ?C)     ?TempSrc: Axillary     ?SpO2:  96% 95% 95%  ?BP 119/62   Pulse 81   Temp 97.6 ?F (36.4 ?C) (Axillary)   Resp (!) 21   SpO2 95%   ?Gen: 71 y.o. male in no distress, resting soundly. ?Pulm: Nonlabored but tachypneic. Clear anterolaterally. ?CV: Regular rate  and rhythm. No murmur, rub, or gallop. No JVD, no dependent edema. ?GI: Abdomen soft, non-tender, non-distended, with normoactive bowel sounds.  ?Ext: Warm, no deformities ?Skin: No rashes, lesions or ulcers on visualized skin. ?Neuro: Lethargic but rousable, not cooperative with exam. ?Psych: Abnormal but unable to fully determine. ? ?Data Reviewed: ? 07/14/21 05:55  ?Iron 120  ?UIBC 220  ?TIBC 340  ?Saturation Ratios 35  ?Ferritin 207  ?Folate 7.5  ? ?Vitamin B12 406 ?07/14/21 10:27  ?WBC 13.4k (3.1 on 07/25/2021)  ?Hgb 9.7, normal indices ?SCr 1.48, bicarb 21, K 4.8 ?CXR personal review: Infiltrate consistent with LLL location on only 1 view. ? ?Assessment and Plan:  ?Acute encephalopathy: Suspect due to infection, has occurred periodically since November associated with illness. History suggests he's responsive during episodes and has no sustained focal deficits.  ?- R/o with MRI and EEG ?- TSH, ammonia pending.  ?- Delirium precautions reviewed ?- Monitor with infection treatment. If not improving, may require inpatient neurology evaluation (vs. deferring to outpatient as scheduled) ? ?LLL pneumonia: Unclear whether this is CAP or aspiration-related.  ?- Continue typical ceftriaxone, azithromycin, planning 5 days.  ?- Check PCT, sputum culture, antigens, trend leukocytosis. ?- Monitor blood cultures ?- prn O2 to maintain SpO2 >89%. During coughing spell during encounter, SpO2 did drop to 88% on room air. ? ?Intellectual disability:  ?- Discuss care with HCPOA ? ?Normocytic anemia: Has had hematology work up in the past. Anemia panel recently showed no deficiency.  ?- Defer further work up to outpatient hematology. Will trend H/H. Required transfusion last hospitalization. If gross GI bleeding develops, would consult GI, though he has scheduled follow up to investigate +FOBT. ? ?Controlled NIDT2DM:  ?- Monitor with sensitive SSI for now. Hold metformin with history of lactic acidosis.  ? ?GERD:  ?- Continue PPI  tomorrow ? ?HLD:  ?- Restart statin tomorrow ? ?AKI with NAGMA: Mild.  ?- Give IVF overnight.  ? ?OSA:  ?- Pt not yet fitted for DME, but did have recent reportedly positive sleep study. Will monitor O2.  ? ? Advance Care Planning: DNAR confirmed with HCPOA ? ?Consults: None ? ?Family Communication: Niece, HCPOA ? ?Severity of Illness: ?The appropriate patient status for this patient is OBSERVATION. Observation status is judged to be reasonable and necessary in order to provide the required intensity of service to ensure the patient's safety. The patient's presenting symptoms, physical exam findings, and initial radiographic and laboratory data in the context of their medical condition is felt to place them at decreased risk for further clinical deterioration. Furthermore, it is anticipated that the patient will be medically stable for discharge from the hospital within 2 midnights of admission.  ? ?Author: ?Patrecia Pour,  MD ?09/02/2021 6:16 PM ? ?For on call review [email or web address].  ?

## 2021-09-02 NOTE — ED Notes (Signed)
Report handed off to [employer].  ?

## 2021-09-02 NOTE — ED Notes (Signed)
This RN spoke with MRI tech who will be able to place patient in transport to go upstairs after MRI scan is complete.  ?

## 2021-09-02 NOTE — Telephone Encounter (Signed)
Pre chart review tab pt is at [facility] ED now. Sending note to Dr [name] and [place] CMA. ?

## 2021-09-02 NOTE — ED Notes (Signed)
Patient transported to CT 

## 2021-09-03 DIAGNOSIS — E1122 Type 2 diabetes mellitus with diabetic chronic kidney disease: Secondary | ICD-10-CM | POA: Diagnosis present

## 2021-09-03 DIAGNOSIS — R1312 Dysphagia, oropharyngeal phase: Secondary | ICD-10-CM | POA: Diagnosis not present

## 2021-09-03 DIAGNOSIS — R001 Bradycardia, unspecified: Secondary | ICD-10-CM | POA: Diagnosis not present

## 2021-09-03 DIAGNOSIS — D696 Thrombocytopenia, unspecified: Secondary | ICD-10-CM | POA: Diagnosis present

## 2021-09-03 DIAGNOSIS — D649 Anemia, unspecified: Secondary | ICD-10-CM | POA: Diagnosis present

## 2021-09-03 DIAGNOSIS — K219 Gastro-esophageal reflux disease without esophagitis: Secondary | ICD-10-CM | POA: Diagnosis present

## 2021-09-03 DIAGNOSIS — N179 Acute kidney failure, unspecified: Secondary | ICD-10-CM | POA: Diagnosis present

## 2021-09-03 DIAGNOSIS — G9341 Metabolic encephalopathy: Secondary | ICD-10-CM | POA: Diagnosis present

## 2021-09-03 DIAGNOSIS — E8809 Other disorders of plasma-protein metabolism, not elsewhere classified: Secondary | ICD-10-CM | POA: Diagnosis present

## 2021-09-03 DIAGNOSIS — G9340 Encephalopathy, unspecified: Secondary | ICD-10-CM | POA: Diagnosis present

## 2021-09-03 DIAGNOSIS — J690 Pneumonitis due to inhalation of food and vomit: Secondary | ICD-10-CM | POA: Diagnosis present

## 2021-09-03 DIAGNOSIS — I959 Hypotension, unspecified: Secondary | ICD-10-CM | POA: Diagnosis not present

## 2021-09-03 DIAGNOSIS — G4733 Obstructive sleep apnea (adult) (pediatric): Secondary | ICD-10-CM | POA: Diagnosis present

## 2021-09-03 DIAGNOSIS — R531 Weakness: Secondary | ICD-10-CM | POA: Diagnosis not present

## 2021-09-03 DIAGNOSIS — R680 Hypothermia, not associated with low environmental temperature: Secondary | ICD-10-CM | POA: Diagnosis not present

## 2021-09-03 DIAGNOSIS — Z841 Family history of disorders of kidney and ureter: Secondary | ICD-10-CM | POA: Diagnosis not present

## 2021-09-03 DIAGNOSIS — Z800 Family history of malignant neoplasm of digestive organs: Secondary | ICD-10-CM | POA: Diagnosis not present

## 2021-09-03 DIAGNOSIS — Z66 Do not resuscitate: Secondary | ICD-10-CM | POA: Diagnosis present

## 2021-09-03 DIAGNOSIS — R4182 Altered mental status, unspecified: Secondary | ICD-10-CM | POA: Diagnosis not present

## 2021-09-03 DIAGNOSIS — E782 Mixed hyperlipidemia: Secondary | ICD-10-CM | POA: Diagnosis present

## 2021-09-03 DIAGNOSIS — N1831 Chronic kidney disease, stage 3a: Secondary | ICD-10-CM | POA: Diagnosis present

## 2021-09-03 DIAGNOSIS — E872 Acidosis, unspecified: Secondary | ICD-10-CM | POA: Diagnosis present

## 2021-09-03 DIAGNOSIS — R1310 Dysphagia, unspecified: Secondary | ICD-10-CM | POA: Diagnosis not present

## 2021-09-03 DIAGNOSIS — E1169 Type 2 diabetes mellitus with other specified complication: Secondary | ICD-10-CM | POA: Diagnosis present

## 2021-09-03 DIAGNOSIS — R5381 Other malaise: Secondary | ICD-10-CM | POA: Diagnosis not present

## 2021-09-03 DIAGNOSIS — F79 Unspecified intellectual disabilities: Secondary | ICD-10-CM | POA: Diagnosis present

## 2021-09-03 DIAGNOSIS — Z8616 Personal history of COVID-19: Secondary | ICD-10-CM | POA: Diagnosis not present

## 2021-09-03 DIAGNOSIS — I700 Atherosclerosis of aorta: Secondary | ICD-10-CM | POA: Diagnosis present

## 2021-09-03 DIAGNOSIS — R4701 Aphasia: Secondary | ICD-10-CM | POA: Diagnosis present

## 2021-09-03 DIAGNOSIS — E119 Type 2 diabetes mellitus without complications: Secondary | ICD-10-CM | POA: Diagnosis not present

## 2021-09-03 DIAGNOSIS — I10 Essential (primary) hypertension: Secondary | ICD-10-CM | POA: Diagnosis not present

## 2021-09-03 DIAGNOSIS — I129 Hypertensive chronic kidney disease with stage 1 through stage 4 chronic kidney disease, or unspecified chronic kidney disease: Secondary | ICD-10-CM | POA: Diagnosis present

## 2021-09-03 DIAGNOSIS — E11649 Type 2 diabetes mellitus with hypoglycemia without coma: Secondary | ICD-10-CM | POA: Diagnosis not present

## 2021-09-03 DIAGNOSIS — Z515 Encounter for palliative care: Secondary | ICD-10-CM | POA: Diagnosis not present

## 2021-09-03 LAB — CBC
HCT: 28.3 % — ABNORMAL LOW (ref 39.0–52.0)
Hemoglobin: 9.7 g/dL — ABNORMAL LOW (ref 13.0–17.0)
MCH: 31.6 pg (ref 26.0–34.0)
MCHC: 34.3 g/dL (ref 30.0–36.0)
MCV: 92.2 fL (ref 80.0–100.0)
Platelets: 148 10*3/uL — ABNORMAL LOW (ref 150–400)
RBC: 3.07 MIL/uL — ABNORMAL LOW (ref 4.22–5.81)
RDW: 16.6 % — ABNORMAL HIGH (ref 11.5–15.5)
WBC: 9.7 10*3/uL (ref 4.0–10.5)
nRBC: 0 % (ref 0.0–0.2)

## 2021-09-03 LAB — PROCALCITONIN: Procalcitonin: 0.28 ng/mL

## 2021-09-03 LAB — BASIC METABOLIC PANEL
Anion gap: 8 (ref 5–15)
BUN: 17 mg/dL (ref 8–23)
CO2: 25 mmol/L (ref 22–32)
Calcium: 9.5 mg/dL (ref 8.9–10.3)
Chloride: 106 mmol/L (ref 98–111)
Creatinine, Ser: 1.41 mg/dL — ABNORMAL HIGH (ref 0.61–1.24)
GFR, Estimated: 53 mL/min — ABNORMAL LOW (ref >60)
Glucose, Bld: 75 mg/dL (ref 70–99)
Potassium: 4.4 mmol/L (ref 3.5–5.1)
Sodium: 139 mmol/L (ref 135–145)

## 2021-09-03 LAB — GLUCOSE, CAPILLARY
Glucose-Capillary: 66 mg/dL — ABNORMAL LOW (ref 70–99)
Glucose-Capillary: 190 mg/dL — ABNORMAL HIGH (ref 70–99)
Glucose-Capillary: 81 mg/dL (ref 70–99)
Glucose-Capillary: 90 mg/dL (ref 70–99)

## 2021-09-03 MED ORDER — SODIUM CHLORIDE 0.9 % IV SOLN
500.0000 mg | INTRAVENOUS | Status: AC
  Administered 2021-09-03 – 2021-09-06 (×4): 500 mg via INTRAVENOUS
  Filled 2021-09-03 (×4): qty 5

## 2021-09-03 MED ORDER — DEXTROSE-NACL 5-0.45 % IV SOLN: INTRAVENOUS | Status: DC

## 2021-09-03 MED ORDER — DEXTROSE 50 % IV SOLN
INTRAVENOUS | Status: AC
  Administered 2021-09-03: 50 mL
  Filled 2021-09-03: qty 50

## 2021-09-03 NOTE — Plan of Care (Signed)
?  Problem: Clinical Measurements: ?Goal: Ability to maintain clinical measurements within normal limits will improve ?Outcome: Progressing ?  ?Problem: Clinical Measurements: ?Goal: Ability to maintain a body temperature in the normal range will improve ?Outcome: Progressing ?  ?Problem: Respiratory: ?Goal: Ability to maintain adequate ventilation will improve ?Outcome: Progressing ?  ?

## 2021-09-03 NOTE — Evaluation (Signed)
Physical Therapy Evaluation Patient Details Name: Daniel Bailey. MRN: [id number] DOB: 08-19-1950 Today's Date: 09/03/2021  History of Present Illness  The pt is a 71 yo male presenting 4/28 with AMS and possible aphasia for about 2 days. Pt suspected to have acute encephalopathy due to infection and LLL PNA. PMH includes: intellectual disability, DM II, HLD, HTN, anemia, and recent aspiration PNA.   Clinical Impression  Pt in bed upon arrival of PT, agreeable to evaluation at this time. Prior to admission the pt was ambulating independently without hx of falls or need for DME, he lives with a cousin and is independent with ADLs and can assist with some chores around the house according to the pt's family. The pt now presents with limitations in functional mobility, strength, coordination, cognition, and dynamic stability due to above dx, and will continue to benefit from skilled PT to address these deficits. He required max-totalA (pt completing <25% of movement) for all bed mobility and attempts at sit-stand transfers. He required assist to guide movements of LE and trunk due to poor motor planning and coordination, and requires significant assist in standing due to strong posterior lean with the pt showing no signs of postural adjustments.   Will continue to benefit from skilled PT acutely, and would typically recommend SNF rehab at d/c given significant decline in mobility and level of assist needed for transfers, but after talking to the pt's legal guardian on the phone, they state they prefer return home with family support. She states they are able and willing to provide physical assist for any mobility. If this were to change and physical assist not be available at home, the pt would need SNF rehab prior to return home.         Recommendations for follow up therapy are one component of a multi-disciplinary discharge planning process, led by the attending physician.  Recommendations may be updated  based on patient status, additional functional criteria and insurance authorization.  Follow Up Recommendations Home health PT (discussed SNF recommendation with legal guardian on the phone, but they prefer home with HHPT)    Assistance Recommended at Discharge Frequent or constant Supervision/Assistance  Patient can return home with the following  Two people to help with walking and/or transfers;Two people to help with bathing/dressing/bathroom;Assistance with cooking/housework;Assistance with feeding;Direct supervision/assist for medications management;Direct supervision/assist for financial management;Assist for transportation;Help with stairs or ramp for entrance    Equipment Recommendations Wheelchair (measurements PT);Wheelchair cushion (measurements PT);BSC/3in1  Recommendations for Other Services       Functional Status Assessment Patient has had a recent decline in their functional status and demonstrates the ability to make significant improvements in function in a reasonable and predictable amount of time.     Precautions / Restrictions Precautions Precautions: Fall Restrictions Weight Bearing Restrictions: No      Mobility  Bed Mobility Overal bed mobility: Needs Assistance Bed Mobility: Supine to Sit, Sit to Supine     Supine to sit: Max assist Sit to supine: Total assist   General bed mobility comments: pt able to assist with some initiation of transition to sitting EOB, poor motor planning to coordinate movement of LE and trunk, maxA to complete movement    Transfers Overall transfer level: Needs assistance Equipment used: 1 person hand held assist Transfers: Sit to/from Stand Sit to Stand: Max assist           General transfer comment: maxA with significant posterior propulsion, unable to adjust balance to maintain static stance  without maxA. no instances of knee buckling. pt then continued to attempt to impulsively stand after returning to sitting.     Ambulation/Gait               General Gait Details: unsafe to attempt at this time     Balance Overall balance assessment: Needs assistance Sitting-balance support: No upper extremity supported, Feet unsupported Sitting balance-Leahy Scale: Zero Sitting balance - Comments: pt falling posteriorly and to L without assist Postural control: Posterior lean Standing balance support: Single extremity supported, During functional activity Standing balance-Leahy Scale: Zero Standing balance comment: pushing posteriorly with no initiation of postural correction. maxA to maintain static stance                             Pertinent Vitals/Pain Pain Assessment Pain Assessment: No/denies pain    Home Living Family/patient expects to be discharged to:: Private residence Living Arrangements: Other relatives (cousin) Available Help at Discharge: Family Type of Home: Mobile home Home Access: Ramped entrance       Home Layout: One level Home Equipment: Agricultural consultant (2 wheels)      Prior Function Prior Level of Function : Independent/Modified Independent             Mobility Comments: pt does not use DME, no falls in last 6 months ADLs Comments: pt able to assist with chores around the house and independent with ADLs, but family does not let him cook.     Hand Dominance   Dominant Hand: Left    Extremity/Trunk Assessment   Upper Extremity Assessment Upper Extremity Assessment: Defer to OT evaluation    Lower Extremity Assessment Lower Extremity Assessment: RLE deficits/detail;LLE deficits/detail;Difficult to assess due to impaired cognition RLE Deficits / Details: pt generating good force spontaneously, but poor command following for MMT. impaired coordination RLE Coordination: decreased fine motor;decreased gross motor LLE Deficits / Details: pt generating good force spontaneously, but poor command following for MMT. impaired coordination LLE  Coordination: decreased fine motor;decreased gross motor    Cervical / Trunk Assessment Cervical / Trunk Assessment: Kyphotic  Communication   Communication: No difficulties;Other (comment) (has intermittent confused states since november where he can't respond verbally but can shake his head yes/no. typically able to converse in short simple sentences)  Cognition Arousal/Alertness: Awake/alert Behavior During Therapy: Restless, Agitated, Impulsive Overall Cognitive Status: History of cognitive impairments - at baseline Area of Impairment: Attention, Following commands, Safety/judgement, Awareness, Problem solving                   Current Attention Level: Focused   Following Commands: Follows one step commands inconsistently, Follows one step commands with increased time Safety/Judgement: Decreased awareness of safety, Decreased awareness of deficits Awareness: Intellectual Problem Solving: Slow processing, Requires verbal cues General Comments: pt not answering any questions verbally at time of evaluation, unable to state name or answer orientation questions. did follow some commands such as "sit on the edge of the bed" but unable to consistently or briskly follow commands. pt with eyes closed through most of session.        General Comments General comments (skin integrity, edema, etc.): VSS on RA        Assessment/Plan    PT Assessment Patient needs continued PT services  PT Problem List Decreased strength;Decreased range of motion;Decreased activity tolerance;Decreased balance;Decreased mobility;Decreased coordination;Decreased cognition;Decreased safety awareness;Decreased knowledge of precautions       PT Treatment Interventions DME  instruction;Gait training;Stair training;Functional mobility training;Therapeutic activities;Therapeutic exercise;Balance training;Patient/family education    PT Goals (Current goals can be found in the Care Plan section)  Acute Rehab  PT Goals Patient Stated Goal: return home PT Goal Formulation: With patient/family Time For Goal Achievement: 09/17/21 Potential to Achieve Goals: Fair    Frequency Min 3X/week        AM-PAC PT "6 Clicks" Mobility  Outcome Measure Help needed turning from your back to your side while in a flat bed without using bedrails?: A Lot Help needed moving from lying on your back to sitting on the side of a flat bed without using bedrails?: A Lot Help needed moving to and from a bed to a chair (including a wheelchair)?: Total Help needed standing up from a chair using your arms (e.g., wheelchair or bedside chair)?: Total Help needed to walk in hospital room?: Total Help needed climbing 3-5 steps with a railing? : Total 6 Click Score: 8    End of Session Equipment Utilized During Treatment: Gait belt Activity Tolerance: Patient tolerated treatment well (impaired command following) Patient left: in bed;with call bell/phone within reach;with bed alarm set Nurse Communication: Mobility status PT Visit Diagnosis: Unsteadiness on feet (R26.81);Other abnormalities of gait and mobility (R26.89);Muscle weakness (generalized) (M62.81)    Time: 0981-1914 PT Time Calculation (min) (ACUTE ONLY): 12 min   Charges:   PT Evaluation $PT Eval Moderate Complexity: 1 Mod          Vickki Muff, PT, DPT   Acute Rehabilitation Department Pager #: [phone]  Ronnie Derby 09/03/2021, 9:47 AM

## 2021-09-03 NOTE — Procedures (Signed)
Patient Name: Daniel Bailey.  ?MRN: [id number]  ?Epilepsy Attending: [name]  ?Referring Physician/Provider: Patrecia Pour, MD ?Date: 09/02/2021 ?Duration: 22.57 mins ? ?Patient history: 71yo M with ams. EEG to evaluate for seizure ? ?Level of alertness: lethargic  ? ?AEDs during EEG study: None ? ?Technical aspects: This EEG study was done with scalp electrodes positioned according to the 10-20 International system of electrode placement. Electrical activity was acquired at a sampling rate of '500Hz'$  and reviewed with a high frequency filter of '70Hz'$  and a low frequency filter of '1Hz'$ . EEG data were recorded continuously and digitally stored.  ? ?Description: No posterior dominant rhythm was seen. EEG showed continuous generalized 3 to 5 Hz theta-delta slowing. Hyperventilation and photic stimulation were not performed.    ? ?ABNORMALITY ?- Continuous slow, generalized ? ?IMPRESSION: ?This study is suggestive of moderate to severe diffuse encephalopathy, nonspecific etiology. No seizures or epileptiform discharges were seen throughout the recording. ? ?[name]  ? ?

## 2021-09-03 NOTE — Evaluation (Signed)
Clinical/Bedside Swallow Evaluation ?Patient Details  ?Name: Daniel Bailey. ?MRN: [id number] ?Date of Birth: 30-Jan-1951 ? ?Today's Date: 09/03/2021 ?Time: SLP Start Time (ACUTE ONLY): 7858 SLP Stop Time (ACUTE ONLY): 1010 ?SLP Time Calculation (min) (ACUTE ONLY): 20 min ? ?Past Medical History:  ?Past Medical History:  ?Diagnosis Date  ? Anemia   ? Elevated PSA   ? Frequent urination   ? Glaucoma   ? History of COVID-19   ? Hyperglycemia   ? Hyperlipidemia   ? Hypertension   ? Sleep apnea   ? ?Past Surgical History:  ?Past Surgical History:  ?Procedure Laterality Date  ? COLONOSCOPY    ? PROSTATE BIOPSY N/A 11/20/2017  ? Procedure: BIOPSY TRANSRECTAL ULTRASONIC PROSTATE (TUBP);  Surgeon: Abbie Sons, MD;  Location: [facility] ORS;  Service: Urology;  Laterality: N/A;  ? ?HPI:  ?Daniel Bailey. is a 71 y.o. male admitted with pna and resulting MS. CT abdomen  in March shows Left lower lobe pneumonia, suspect underlying aspiration given  fluid distended esophagus and left lower lobe airway opacification. Pt was seen by SLP in March 2023 and given reflux precautions given similar finding. Pt with medical history of intellectual disability, T2DM, HLD, anemia, recent aspiration pneumonia admission who presented with worsening alteration of mental status in the setting of recent cough. History given by guardian, pt's cousin, as pt is somnolent from receiving IM geodon earlier for agitation. He developed a cough a few days prior but had no fever or evidence of dyspnea. Then yesterday the guardian's son, with whom he lives, reported that the patient, who is normally quite verbose was not speaking very much, then was unable to even get words out to answer questions, was at times answering questions incorrectly.  ?  ?Assessment / Plan / Recommendation  ?Clinical Impression ? Pt presents with lethargy with intermittent ability to attend to PO. Initially pt following one step commands though he kept eyes closed. Noted to have some  pooled oral secreitons. SLP was able to give verbal cues and pt took sips of water and thin coke with only a brief oral hold. Inital bites of pudding also toelrated well. When given a cracker her needed increased verbal cues and the progressively began orally holding all further boluses. He also seemed to ahve some belching. Eventually pt had significant anterior spilalge of oral residue and coughing. Pt may have a few sips of water if alert and may also have meds crushed in puree but is not ready for a diet yet due to AMS. Pt may also be exhibiting secondary signs of a primary esophageal dysphagia. SLP will need to f/u for further trials when more alert. MBS may be warranted depending on his progress. ?SLP Visit Diagnosis: Dysphagia, unspecified (R13.10) ?   ?Aspiration Risk ? Moderate aspiration risk  ?  ?Diet Recommendation NPO except meds  ? ?Medication Administration: Crushed with puree ?Supervision: Staff to assist with self feeding ?Compensations: Slow rate;Small sips/bites ?Postural Changes: Seated upright at 90 degrees  ?  ?Other  Recommendations     ? ?Recommendations for follow up therapy are one component of a multi-disciplinary discharge planning process, led by the attending physician.  Recommendations may be updated based on patient status, additional functional criteria and insurance authorization. ? ?Follow up Recommendations Skilled nursing-short term rehab (<3 hours/day)  ? ? ?  ?Assistance Recommended at Discharge Frequent or constant Supervision/Assistance  ?Functional Status Assessment Patient has had a recent decline in their functional status and demonstrates the ability  to make significant improvements in function in a reasonable and predictable amount of time.  ?Frequency and Duration min 2x/week  ?2 weeks ?  ?   ? ?Prognosis Prognosis for Safe Diet Advancement: Fair ?Barriers to Reach Goals: Cognitive deficits  ? ?  ? ?Swallow Study   ?General HPI: Daniel Bailey. is a 71 y.o. male admitted  with pna and resulting MS. CT abdomen  in March shows Left lower lobe pneumonia, suspect underlying aspiration given  fluid distended esophagus and left lower lobe airway opacification. Pt was seen by SLP in March 2023 and given reflux precautions given similar finding. Pt with medical history of intellectual disability, T2DM, HLD, anemia, recent aspiration pneumonia admission who presented with worsening alteration of mental status in the setting of recent cough. History given by guardian, pt's cousin, as pt is somnolent from receiving IM geodon earlier for agitation. He developed a cough a few days prior but had no fever or evidence of dyspnea. Then yesterday the guardian's son, with whom he lives, reported that the patient, who is normally quite verbose was not speaking very much, then was unable to even get words out to answer questions, was at times answering questions incorrectly. ?Type of Study: Bedside Swallow Evaluation ?Diet Prior to this Study: NPO ?History of Recent Intubation: No ?Behavior/Cognition: Lethargic/Drowsy ?Oral Cavity Assessment: Excessive secretions ?Oral Care Completed by SLP: No ?Oral Cavity - Dentition: Missing dentition ?Self-Feeding Abilities: Total assist ?Patient Positioning: Upright in bed  ?  ?Oral/Motor/Sensory Function Overall Oral Motor/Sensory Function: Generalized oral weakness   ?Ice Chips Ice chips: Not tested   ?Thin Liquid Thin Liquid: Impaired ?Presentation: Straw ?Oral Phase Impairments: Reduced labial seal ?Oral Phase Functional Implications: Oral holding;Oral residue;Prolonged oral transit ?Pharyngeal  Phase Impairments: Cough - Immediate  ?  ?Nectar Thick Nectar Thick Liquid: Not tested   ?Honey Thick Honey Thick Liquid: Not tested   ?Puree Puree: Impaired ?Presentation: Spoon ?Oral Phase Functional Implications: Oral holding;Oral residue;Prolonged oral transit ?Pharyngeal Phase Impairments: Cough - Immediate   ?Solid ? ? ?  Solid: Impaired ?Oral Phase Functional  Implications: Prolonged oral transit;Impaired mastication;Oral residue  ? ?  ? ?Daniel Bailey, Katherene Ponto ?09/03/2021,10:21 AM ? ? ? ?

## 2021-09-03 NOTE — Evaluation (Signed)
Occupational Therapy Evaluation ?Patient Details ?Name: Daniel Bailey. ?MRN: [id number] ?DOB: 1950-09-11 ?Today's Date: 09/03/2021 ? ? ?History of Present Illness The pt is a 71 yo male presenting 4/28 with AMS and possible aphasia for about 2 days. Pt suspected to have acute encephalopathy due to infection and LLL PNA. PMH includes: intellectual disability, DM II, HLD, HTN, anemia, and recent aspiration PNA.  ? ?Clinical Impression ?  ?PTA, pt was living with his family and was independent with functional mobility and ADL, pt would assist with IADL except for cooking. Per chart, pt was conversant at baseline. This session, pt answering yes/no questions <25% of the time and inconsistently following one step commands. He required modA-maxA for bed mobility and minA for stability sitting EOB. Pt required assistance to wash his face this session. Will continue to follow acutely. At this time recommend d/c to further rehab. Should family choose to have pt return home he will need 24/7 physical assistance, w/c BSC and tub transfer bench.   ?   ? ?Recommendations for follow up therapy are one component of a multi-disciplinary discharge planning process, led by the attending physician.  Recommendations may be updated based on patient status, additional functional criteria and insurance authorization.  ? ?Follow Up Recommendations ? Skilled nursing-short term rehab (<3 hours/day)  ?  ?Assistance Recommended at Discharge Frequent or constant Supervision/Assistance  ?Patient can return home with the following A lot of help with walking and/or transfers;A lot of help with bathing/dressing/bathroom;Assistance with cooking/housework ? ?  ?Functional Status Assessment ? Patient has had a recent decline in their functional status and demonstrates the ability to make significant improvements in function in a reasonable and predictable amount of time.  ?Equipment Recommendations ? BSC/3in1;Wheelchair (measurements OT)  ?   ?Recommendations for Other Services   ? ? ?  ?Precautions / Restrictions Precautions ?Precautions: Fall ?Restrictions ?Weight Bearing Restrictions: No  ? ?  ? ?Mobility Bed Mobility ?Overal bed mobility: Needs Assistance ?Bed Mobility: Supine to Sit, Sit to Supine ?  ?  ?Supine to sit: Mod assist, HOB elevated ?Sit to supine: Max assist, HOB elevated ?  ?General bed mobility comments: pt assisting with progression toward EOB, decreased motor planning and coordination of BUE vs trunk vs LB ?  ? ?Transfers ?  ?  ?  ?  ?  ?  ?  ?  ?  ?General transfer comment: did not assess ?  ? ?  ?Balance Overall balance assessment: Needs assistance ?Sitting-balance support: No upper extremity supported, Feet unsupported ?Sitting balance-Leahy Scale: Poor ?Sitting balance - Comments: posterior loss of balance ?Postural control: Posterior lean ?  ?  ?  ?  ?  ?  ?  ?  ?  ?  ?  ?  ?  ?  ?   ? ?ADL either performed or assessed with clinical judgement  ? ?ADL   ?  ?  ?  ?  ?  ?  ?  ?  ?  ?  ?  ?  ?  ?  ?  ?  ?  ?  ?  ?   ? ? ? ?Vision   ?   ?   ?Perception   ?  ?Praxis   ?  ? ?Pertinent Vitals/Pain Pain Assessment ?Pain Assessment: No/denies pain  ? ? ? ?Hand Dominance Left ?  ?Extremity/Trunk Assessment Upper Extremity Assessment ?Upper Extremity Assessment: Difficult to assess due to impaired cognition ?  ?Lower Extremity Assessment ?Lower Extremity Assessment: Defer to  PT evaluation ?  ?Cervical / Trunk Assessment ?Cervical / Trunk Assessment: Kyphotic ?  ?Communication Communication ?Communication: No difficulties;Other (comment) (has intermittent confused states since november where he can't respond verbally but can shake his head yes/no. typically able to converse in short simple sentences) ?  ?Cognition Arousal/Alertness: Awake/alert ?Behavior During Therapy: Restless, Agitated, Impulsive ?Overall Cognitive Status: History of cognitive impairments - at baseline ?Area of Impairment: Attention, Following commands, Safety/judgement,  Awareness, Problem solving ?  ?  ?  ?  ?  ?  ?  ?  ?  ?Current Attention Level: Focused ?  ?Following Commands: Follows one step commands inconsistently, Follows one step commands with increased time ?Safety/Judgement: Decreased awareness of safety, Decreased awareness of deficits ?Awareness: Intellectual ?Problem Solving: Slow processing, Requires verbal cues ?General Comments: pt intermittently answering yes/no questions, did not appear to be consistent. Following one step commands <25% of the time. pt keeping eyes shut majority of session ?  ?  ?General Comments  VSS on RA ? ?  ?Exercises   ?  ?Shoulder Instructions    ? ? ?Home Living Family/patient expects to be discharged to:: Private residence ?Living Arrangements: Other relatives (cousin) ?Available Help at Discharge: Family ?Type of Home: Mobile home ?Home Access: Ramped entrance ?  ?  ?Home Layout: One level ?  ?  ?Bathroom Shower/Tub: Tub/shower unit ?  ?Bathroom Toilet: Standard ?  ?  ?Home Equipment: Conservation officer, nature (2 wheels) ?  ?Additional Comments: home living info and prior function info gathered from chart ?  ? ?  ?Prior Functioning/Environment Prior Level of Function : Independent/Modified Independent ?  ?  ?  ?  ?  ?  ?Mobility Comments: pt does not use DME, no falls in last 6 months ?ADLs Comments: pt able to assist with chores around the house and independent with ADLs, but family does not let him cook. ?  ? ?  ?  ?OT Problem List: Decreased activity tolerance;Impaired balance (sitting and/or standing);Decreased safety awareness;Decreased cognition ?  ?   ?OT Treatment/Interventions: Self-care/ADL training;Therapeutic exercise;DME and/or AE instruction;Therapeutic activities;Patient/family education;Cognitive remediation/compensation;Balance training  ?  ?OT Goals(Current goals can be found in the care plan section) Acute Rehab OT Goals ?Patient Stated Goal: pt did not state ?OT Goal Formulation: With patient ?Time For Goal Achievement:  09/17/21 ?Potential to Achieve Goals: Good ?ADL Goals ?Pt Will Perform Grooming: with supervision;standing ?Pt Will Perform Lower Body Dressing: with supervision;sit to/from stand ?Pt Will Transfer to Toilet: with supervision;ambulating ?Additional ADL Goal #1: Pt will follow 2-3 step commands >75% during ADL completion.  ?OT Frequency: Min 2X/week ?  ? ?Co-evaluation   ?  ?  ?  ?  ? ?  ?AM-PAC OT "6 Clicks" Daily Activity     ?Outcome Measure Help from another person eating meals?: A Lot ?Help from another person taking care of personal grooming?: A Lot ?Help from another person toileting, which includes using toliet, bedpan, or urinal?: Total ?Help from another person bathing (including washing, rinsing, drying)?: A Lot ?Help from another person to put on and taking off regular upper body clothing?: A Lot ?Help from another person to put on and taking off regular lower body clothing?: A Lot ?6 Click Score: 11 ?  ?End of Session Nurse Communication: Mobility status ? ?Activity Tolerance: Patient tolerated treatment well ?Patient left: in bed;with call bell/phone within reach;with bed alarm set;with restraints reapplied (bilateral mitts on) ? ?OT Visit Diagnosis: Other abnormalities of gait and mobility (R26.89);Muscle weakness (generalized) (M62.81);Other symptoms and  signs involving cognitive function  ?              ?Time: 6468-0321 ?OT Time Calculation (min): 15 min ?Charges:  OT General Charges ?$OT Visit: 1 Visit ?OT Evaluation ?$OT Eval Moderate Complexity: 1 Mod ? ?Helene Kelp OTR/L ?Acute Rehabilitation Services ?Office: [phone] ? ? ?Wyn Forster ?09/03/2021, 2:05 PM ?

## 2021-09-03 NOTE — Progress Notes (Signed)
?   09/03/21 0357  ?Assess: MEWS Score  ?Temp (!) 91.6 ?F (33.1 ?C)  ?BP 111/72  ?Pulse Rate 69  ?Resp 18  ?SpO2 98 %  ?O2 Device Room Air  ?Assess: MEWS Score  ?MEWS Temp 2  ?MEWS Systolic 0  ?MEWS Pulse 0  ?MEWS RR 0  ?MEWS LOC 0  ?MEWS Score 2  ?MEWS Score Color Yellow  ?Assess: if the MEWS score is Yellow or Red  ?Were vital signs taken at a resting state? Yes  ?Focused Assessment No change from prior assessment  ?Early Detection of Sepsis Score *See Row Information* High  ?MEWS guidelines implemented *See Row Information* Yes  ?Treat  ?MEWS Interventions Escalated (See documentation below)  ?Pain Scale 0-10  ?Pain Score 0  ?Faces Pain Scale 0  ?Take Vital Signs  ?Increase Vital Sign Frequency  Yellow: Q 2hr X 2 then Q 4hr X 2, if remains yellow, continue Q 4hrs  ?Escalate  ?MEWS: Escalate Yellow: discuss with charge nurse/RN and consider discussing with provider and RRT  ?Notify: Charge Nurse/RN  ?Name of Charge Nurse/RN Notified Dorthula Nettles., RN  ?Date Charge Nurse/RN Notified 09/03/21  ?Time Charge Nurse/RN Notified 0410  ?Notify: Provider  ?Provider Name/Title MD Lyanne Co  ?Date Provider Notified 09/03/21  ?Time Provider Notified 0400  ?Notification Type Page ?(secure chat)  ?Notification Reason Other (Comment) ?(Temp 91.6 F)  ?Provider response Other (Comment) ?(pt a lots of warm blankets to pt.)  ?Date of Provider Response 09/03/21  ?Time of Provider Response 0405  ?Notify: Rapid Response  ?Name of Rapid Response RN Notified N/a  ?Document  ?Patient Outcome Not stable and remains on department  ?Progress note created (see row info) Yes  ? ? ?

## 2021-09-03 NOTE — Progress Notes (Signed)
Hypoglycemic Event ? ?CBG: 66  ? ?Treatment: D50 50 mL (25 gm) ? ?Symptoms: None ? ?Follow-up CBG: [id number] CBG Result:190 ? ?Possible Reasons for Event: Inadequate meal intake ? ?Comments/MD notified:MD made aware ? ? ? ?Daniel Bailey ? ? ?

## 2021-09-03 NOTE — Progress Notes (Signed)
?Progress Note ? ?Patient: Daniel Bailey. MVV:[id number] DOB: 1950/09/28  ?DOA: 09/02/2021  DOS: 09/03/2021  ?  ?Brief hospital course: ?Daniel Bailey. is a 71 y.o. male with medical history of intellectual disability, T2DM, HLD, anemia, recent aspiration pneumonia admission who presented with worsening alteration of mental status in the setting of recent cough.  ? ?He was afebrile with leukocytosis, retrocardiac infiltrate on CXR, and mild AKI. EDP reports he had stuttering speech on arrival, had difficulty saying what he seemed to want to say but had nonfocal exam. CT head normal for age. The patient became more agitated ultimately requiring IM geodon. Admission requested for pneumonia complicated by AMS. ? ?Assessment and Plan: ?Acute encephalopathy: Suspect due to infection, has occurred periodically since November associated with illness. History suggests he's responsive during episodes and has no sustained focal deficits. MRI negative. EEG with nonspecific generalized slowing without seizure/epileptiform discharges. Ammonia, TSH wnl.  ?- Delirium precautions reviewed ?- Monitor with infection treatment. If not improving, may require inpatient neurology evaluation (vs. deferring to outpatient as scheduled) ?  ?LLL pneumonia: Unclear whether this is CAP or aspiration-related.  ?- Continue typical ceftriaxone, azithromycin, planning 5 days.  ?- Trend PCT and leukocytosis (both improving).  ?- Sputum culture and antigens pending ?- Monitor blood cultures ?- prn O2 to maintain SpO2 >89%. Not currently hypoxemic. ?- With history of aspiration pneumonia and current altered mental status, SLP evaluation requested, may need MBSS. ?  ?Intellectual disability:  ?- Discuss care with HCPOA daily ?  ?Normocytic anemia: Has had hematology work up in the past. Anemia panel recently showed no deficiency.  ?- Hgb stable at 9.7g/dl. Required transfusion last hospitalization. If gross GI bleeding develops, would consult GI, though he  has scheduled follow up to investigate +FOBT. ?  ?Controlled NIDT2DM:  ?- Monitor q6h, no insulin ordered. Had hypoglycemia this AM without any agents having been administered. Will check cortisol (also hypothermic) in AM. Hold metformin with history of lactic acidosis.  ?  ?GERD:  ?- Continue PPI ?  ?HLD:  ?- Restart statin ?  ?AKI with NAGMA: Mild.  ?- Stable-to-improved. Continue monitoring, avoid nephrotoxins. ?  ?OSA:  ?- Pt not yet fitted for DME, but did have recent reportedly positive sleep study. Will monitor O2.  ? ?Thrombocytopenia: Mild.  ?- Recheck in AM.  ? ?Subjective: Pt with minimal verbal output but appears to be answering questions appropriately some of the time. Was hypothermic overnight without other evidence of evolving sepsis.  ? ?Objective: ?Vitals:  ? 09/03/21 0511 09/03/21 3005 09/03/21 0811 09/03/21 1223  ?BP:  119/69 (!) 117/92 115/84  ?Pulse:  76 91 89  ?Resp:  '18 20 17  '$ ?Temp: (!) 91.9 ?F (33.3 ?C) (!) 93.2 ?F (34 ?C) (!) 94.8 ?F (34.9 ?C) (!) 97.4 ?F (36.3 ?C)  ?TempSrc: Rectal Rectal Rectal Rectal  ?SpO2:  95%  94%  ?Weight:      ? ?Gen: Nontoxic 71 y.o. male in no distress ?Pulm: Nonlabored breathing room air. Crackles in L lateral chest ?CV: Regular rate and rhythm. No murmur, rub, or gallop. No JVD, no dependent edema. ?GI: Abdomen soft, non-tender, non-distended, with normoactive bowel sounds.  ?Ext: Warm, no deformities ?Skin: No acute rashes, lesions or ulcers on visualized skin. Abrasion to forehead stable, SebK's noted. ?Neuro: Resting but rousable, does not follow commands at this time, moves arms and legs evenly. ?Psych: UTD. ? ?Data Personally reviewed: ?CBC: ?Recent Labs  ?Lab 09/02/21 ?1517 09/03/21 ?0252  ?WBC 13.4* 9.7  ?  NEUTROABS 12.6*  --   ?HGB 9.7* 9.7*  ?HCT 29.2* 28.3*  ?MCV 92.7 92.2  ?PLT 176 148*  ? ?Basic Metabolic Panel: ?Recent Labs  ?Lab 09/02/21 ?1517 09/03/21 ?0252  ?NA 136 139  ?K 4.8 4.4  ?CL 105 106  ?CO2 21* 25  ?GLUCOSE 84 75  ?BUN 18 17  ?CREATININE  1.48* 1.41*  ?CALCIUM 9.7 9.5  ?MG 1.7  --   ? ?GFR: ?Estimated Creatinine Clearance: 49.5 mL/min (A) (by C-G formula based on SCr of 1.41 mg/dL (H)). ?Liver Function Tests: ?Recent Labs  ?Lab 09/02/21 ?1517  ?AST 29  ?ALT 15  ?ALKPHOS 96  ?BILITOT 0.6  ?PROT 6.9  ?ALBUMIN 3.1*  ? ?No results for input(s): LIPASE, AMYLASE in the last 168 hours. ?Recent Labs  ?Lab 09/02/21 ?2008  ?AMMONIA 18  ? ?Coagulation Profile: ?No results for input(s): INR, PROTIME in the last 168 hours. ?Cardiac Enzymes: ?No results for input(s): CKTOTAL, CKMB, CKMBINDEX, TROPONINI in the last 168 hours. ?BNP (last 3 results) ?No results for input(s): PROBNP in the last 8760 hours. ?HbA1C: ?No results for input(s): HGBA1C in the last 72 hours. ?CBG: ?Recent Labs  ?Lab 09/02/21 ?2003 09/03/21 ?0723 09/03/21 ?0757 09/03/21 ?1124  ?GLUCAP 83 66* 190* 81  ? ?Lipid Profile: ?No results for input(s): CHOL, HDL, LDLCALC, TRIG, CHOLHDL, LDLDIRECT in the last 72 hours. ?Thyroid Function Tests: ?Recent Labs  ?  09/02/21 ?2008  ?TSH 2.959  ? ?Anemia Panel: ?No results for input(s): VITAMINB12, FOLATE, FERRITIN, TIBC, IRON, RETICCTPCT in the last 72 hours. ?Urine analysis: ?   ?Component Value Date/Time  ? COLORURINE STRAW (A) 09/02/2021 2135  ? APPEARANCEUR CLEAR 09/02/2021 2135  ? LABSPEC 1.006 09/02/2021 2135  ? PHURINE 5.0 09/02/2021 2135  ? [place] NEGATIVE 09/02/2021 2135  ? HGBUR SMALL (A) 09/02/2021 2135  ? [place] NEGATIVE 09/02/2021 2135  ? BILIRUBINUR neg 08/10/2021 1616  ? [place] NEGATIVE 09/02/2021 2135  ? PROTEINUR 30 (A) 09/02/2021 2135  ? UROBILINOGEN 0.2 08/10/2021 1616  ? NITRITE NEGATIVE 09/02/2021 2135  ? LEUKOCYTESUR NEGATIVE 09/02/2021 2135  ? ?Recent Results (from the past 240 hour(s))  ?Culture, blood (routine x 2)     Status: None (Preliminary result)  ? Collection Time: 09/02/21  3:55 PM  ? Specimen: BLOOD  ?Result Value Ref Range Status  ? Specimen Description BLOOD SITE NOT SPECIFIED  Final  ? Special Requests   Final   ?  BOTTLES DRAWN AEROBIC AND ANAEROBIC Blood Culture adequate volume  ? Culture   Final  ?  NO GROWTH < 24 HOURS ?Performed at [facility], [place] [address], [place], [place] [address] ?  ? Report Status PENDING  Incomplete  ?Culture, blood (routine x 2)     Status: None (Preliminary result)  ? Collection Time: 09/02/21  3:55 PM  ? Specimen: BLOOD  ?Result Value Ref Range Status  ? Specimen Description BLOOD SITE NOT SPECIFIED  Final  ? Special Requests   Final  ?  BOTTLES DRAWN AEROBIC AND ANAEROBIC Blood Culture results may not be optimal due to an excessive volume of blood received in culture bottles  ? Culture   Final  ?  NO GROWTH < 24 HOURS ?Performed at [facility], [place] [address] [place], [place] [address] ?  ? Report Status PENDING  Incomplete  ?   ?CT Head Wo Contrast ? ?Result Date: 09/02/2021 ?CLINICAL DATA:  Mental status changes EXAM: CT HEAD WITHOUT CONTRAST TECHNIQUE: Contiguous axial images were obtained  from the base of the skull through the vertex without intravenous contrast. RADIATION DOSE REDUCTION: This exam was performed according to the departmental dose-optimization program which includes automated exposure control, adjustment of the mA and/or kV according to patient size and/or use of iterative reconstruction technique. COMPARISON:  07/12/2021 FINDINGS: Brain: No evidence of acute infarction, hemorrhage, hydrocephalus, extra-axial collection or mass lesion/mass effect. Vascular: No hyperdense vessel or unexpected calcification. Skull: Normal. Negative for fracture or focal lesion. Sinuses/Orbits: Paranasal sinuses and mastoid air cells are clear. Other: None IMPRESSION: 1. No acute intracranial abnormalities. 2. Normal brain for age. Electronically Signed   By: Kerby Moors M.D.   On: 09/02/2021 15:35  ? ?MR BRAIN WO CONTRAST ? ?Result Date: 09/02/2021 ?CLINICAL DATA:  Altered mental status EXAM: MRI HEAD WITHOUT CONTRAST TECHNIQUE: Multiplanar, multiecho pulse sequences of  the brain and surrounding structures were obtained without intravenous contrast. COMPARISON:  No prior MRI, correlation is made with CT head 09/02/2021 FINDINGS: Evaluation is somewhat limited by moti

## 2021-09-04 ENCOUNTER — Inpatient Hospital Stay (HOSPITAL_COMMUNITY): Payer: Medicare Other

## 2021-09-04 DIAGNOSIS — I10 Essential (primary) hypertension: Secondary | ICD-10-CM | POA: Diagnosis not present

## 2021-09-04 DIAGNOSIS — K219 Gastro-esophageal reflux disease without esophagitis: Secondary | ICD-10-CM | POA: Diagnosis not present

## 2021-09-04 DIAGNOSIS — N179 Acute kidney failure, unspecified: Secondary | ICD-10-CM | POA: Diagnosis not present

## 2021-09-04 DIAGNOSIS — E119 Type 2 diabetes mellitus without complications: Secondary | ICD-10-CM | POA: Diagnosis not present

## 2021-09-04 LAB — BASIC METABOLIC PANEL
Anion gap: 8 (ref 5–15)
BUN: 14 mg/dL (ref 8–23)
CO2: 22 mmol/L (ref 22–32)
Calcium: 9.2 mg/dL (ref 8.9–10.3)
Chloride: 108 mmol/L (ref 98–111)
Creatinine, Ser: 1.71 mg/dL — ABNORMAL HIGH (ref 0.61–1.24)
GFR, Estimated: 42 mL/min — ABNORMAL LOW (ref >60)
Glucose, Bld: 93 mg/dL (ref 70–99)
Potassium: 4.4 mmol/L (ref 3.5–5.1)
Sodium: 138 mmol/L (ref 135–145)

## 2021-09-04 LAB — CBC
HCT: 27.3 % — ABNORMAL LOW (ref 39.0–52.0)
Hemoglobin: 9.3 g/dL — ABNORMAL LOW (ref 13.0–17.0)
MCH: 31.1 pg (ref 26.0–34.0)
MCHC: 34.1 g/dL (ref 30.0–36.0)
MCV: 91.3 fL (ref 80.0–100.0)
Platelets: 148 10*3/uL — ABNORMAL LOW (ref 150–400)
RBC: 2.99 MIL/uL — ABNORMAL LOW (ref 4.22–5.81)
RDW: 16.7 % — ABNORMAL HIGH (ref 11.5–15.5)
WBC: 6.7 10*3/uL (ref 4.0–10.5)
nRBC: 0 % (ref 0.0–0.2)

## 2021-09-04 LAB — PROCALCITONIN: Procalcitonin: 0.18 ng/mL

## 2021-09-04 LAB — URINE CULTURE: Culture: <10000 — AB

## 2021-09-04 LAB — GLUCOSE, CAPILLARY
Glucose-Capillary: 113 mg/dL — ABNORMAL HIGH (ref 70–99)
Glucose-Capillary: 90 mg/dL (ref 70–99)
Glucose-Capillary: 97 mg/dL (ref 70–99)
Glucose-Capillary: 99 mg/dL (ref 70–99)

## 2021-09-04 LAB — CORTISOL-AM, BLOOD: Cortisol - AM: 12.9 ug/dL (ref 6.7–22.6)

## 2021-09-04 MED ORDER — DEXTROSE IN LACTATED RINGERS 5 % IV SOLN: INTRAVENOUS | Status: DC

## 2021-09-04 NOTE — Progress Notes (Signed)
Progress Note  Patient: Daniel Bailey. ZOX:[id number] DOB: 09-21-1950  DOA: 09/02/2021  DOS: 09/04/2021    Brief hospital course: Cardell Peach. is a 71 y.o. male with medical history of intellectual disability, T2DM, HLD, anemia, recent aspiration pneumonia admission who presented with worsening alteration of mental status in the setting of recent cough.   He was afebrile with leukocytosis, retrocardiac infiltrate on CXR, and mild AKI. EDP reports he had stuttering speech on arrival, had difficulty saying what he seemed to want to say but had nonfocal exam. CT head normal for age. The patient became more agitated ultimately requiring IM geodon. Admission requested for pneumonia complicated by AMS.  Assessment and Plan: Acute encephalopathy on baseline intellectual disability: Suspect due to infection, has occurred periodically since November associated with illness. History suggests he's responsive during episodes and has no sustained focal deficits. MRI negative. EEG with nonspecific generalized slowing without seizure/epileptiform discharges. Ammonia, TSH wnl.  - Delirium precautions reviewed - Monitor with infection treatment. If not improving, may require inpatient neurology evaluation (vs. deferring to outpatient as scheduled) - Discuss care with HCPOA daily   LLL pneumonia: Unclear whether this is CAP or aspiration-related. With SLP evaluations, concern for aspiration is quite high.  - Continue typical ceftriaxone, azithromycin, planning 5 days. - Leukocytosis resolved, PCT sustained improvement. - Sputum culture and antigens pending - Monitor blood cultures - prn O2 to maintain SpO2 >89%. Not currently hypoxemic. - With history of aspiration pneumonia and current altered mental status, SLP evaluation requested, guidance is to remain NPOmay need MBSS.   Normocytic anemia: Has had hematology work up in the past. Anemia panel recently showed no deficiency.  - Hgb 9.3 g/dl. Required  transfusion last hospitalization. If gross GI bleeding develops, would consult GI, though he has scheduled follow up to investigate +FOBT.   Controlled NIDT2DM:  - Monitor q6h, no insulin ordered. Hold metformin with history of lactic acidosis.    GERD:  - Hold PPI to minimize enteral intake/provocation for aspiration.   HLD:  - Hold statin to minimize po intake   AKI with NAGMA:  - Check renal U/S, urine lytes.  - Continue monitoring, avoid nephrotoxins.   OSA:  - Pt not yet fitted for DME, but did have recent reportedly positive sleep study. Will monitor O2.   Thrombocytopenia: Mild and stable.  Subjective: Had return of euthermia through the day yesterday, no significant agitation but remains unredirectable and pulling at lines when without mittens. Not near his baseline.    Objective: Vitals:   09/03/21 1643 09/03/21 2104 09/04/21 0424 09/04/21 1021  BP: (!) 139/103 (!) 107/56 122/62 (!) 115/101  Pulse: 69 88 83 85  Resp: 20 19 18 19   Temp: (!) 100.4 F (38 C) 98.6 F (37 C) 97.6 F (36.4 C) 98.9 F (37.2 C)  TempSrc: Axillary Axillary Axillary Oral  SpO2: 95% 96% 98% 95%  Weight:  85.5 kg     Gen: 71 y.o. male in no distress Pulm: Nonlabored breathing room air. Crackles lateral bases. CV: Regular rate and rhythm. No murmur, rub, or gallop. No JVD, no dependent edema. GI: Abdomen soft, non-tender, non-distended, with normoactive bowel sounds.  Ext: Warm, no deformities Skin: No rashes, lesions or ulcers on visualized skin. Mittens in place bilaterally Neuro: Alert, follows commands but is nonverbal. Moves all extremities on command. Psych: UTD.  Data Personally reviewed: CBC: Recent Labs  Lab 09/02/21 1517 09/03/21 0252 09/04/21 0126  WBC 13.4* 9.7 6.7  NEUTROABS 12.6*  --   --  HGB 9.7* 9.7* 9.3*  HCT 29.2* 28.3* 27.3*  MCV 92.7 92.2 91.3  PLT 176 148* 148*   Basic Metabolic Panel: Recent Labs  Lab 09/02/21 1517 09/03/21 0252 09/04/21 0126  NA  136 139 138  K 4.8 4.4 4.4  CL 105 106 108  CO2 21* 25 22  GLUCOSE 84 75 93  BUN 18 17 14   CREATININE 1.48* 1.41* 1.71*  CALCIUM 9.7 9.5 9.2  MG 1.7  --   --    GFR: Estimated Creatinine Clearance: 41 mL/min (A) (by C-G formula based on SCr of 1.71 mg/dL (H)). Liver Function Tests: Recent Labs  Lab 09/02/21 1517  AST 29  ALT 15  ALKPHOS 96  BILITOT 0.6  PROT 6.9  ALBUMIN 3.1*   No results for input(s): LIPASE, AMYLASE in the last 168 hours. Recent Labs  Lab 09/02/21 2008  AMMONIA 18   Coagulation Profile: No results for input(s): INR, PROTIME in the last 168 hours. Cardiac Enzymes: No results for input(s): CKTOTAL, CKMB, CKMBINDEX, TROPONINI in the last 168 hours. BNP (last 3 results) No results for input(s): PROBNP in the last 8760 hours. HbA1C: No results for input(s): HGBA1C in the last 72 hours. CBG: Recent Labs  Lab 09/03/21 0757 09/03/21 1124 09/03/21 1827 09/04/21 0034 09/04/21 0521  GLUCAP 190* 81 90 99 97   Lipid Profile: No results for input(s): CHOL, HDL, LDLCALC, TRIG, CHOLHDL, LDLDIRECT in the last 72 hours. Thyroid Function Tests: Recent Labs    09/02/21 2008  TSH 2.959   Anemia Panel: No results for input(s): VITAMINB12, FOLATE, FERRITIN, TIBC, IRON, RETICCTPCT in the last 72 hours. Urine analysis:    Component Value Date/Time   COLORURINE STRAW (A) 09/02/2021 2135   APPEARANCEUR CLEAR 09/02/2021 2135   LABSPEC 1.006 09/02/2021 2135   PHURINE 5.0 09/02/2021 2135   GLUCOSEU NEGATIVE 09/02/2021 2135   HGBUR SMALL (A) 09/02/2021 2135   BILIRUBINUR NEGATIVE 09/02/2021 2135   BILIRUBINUR neg 08/10/2021 1616   KETONESUR NEGATIVE 09/02/2021 2135   PROTEINUR 30 (A) 09/02/2021 2135   UROBILINOGEN 0.2 08/10/2021 1616   NITRITE NEGATIVE 09/02/2021 2135   LEUKOCYTESUR NEGATIVE 09/02/2021 2135   Recent Results (from the past 240 hour(s))  Urine Culture     Status: Abnormal   Collection Time: 09/02/21  1:45 PM   Specimen: Urine, Clean  Catch  Result Value Ref Range Status   Specimen Description URINE, CLEAN CATCH  Final   Special Requests NONE  Final   Culture (A)  Final    <10,000 COLONIES/mL INSIGNIFICANT GROWTH Performed at [facility], [address] [place], [place] [address]    Report Status 09/04/2021 FINAL  Final  Culture, blood (routine x 2)     Status: None (Preliminary result)   Collection Time: 09/02/21  3:55 PM   Specimen: BLOOD  Result Value Ref Range Status   Specimen Description BLOOD SITE NOT SPECIFIED  Final   Special Requests   Final    BOTTLES DRAWN AEROBIC AND ANAEROBIC Blood Culture adequate volume   Culture   Final    NO GROWTH 2 DAYS Performed at [facility], [address] [place], [place] [address]    Report Status PENDING  Incomplete  Culture, blood (routine x 2)     Status: None (Preliminary result)   Collection Time: 09/02/21  3:55 PM   Specimen: BLOOD  Result Value Ref Range Status   Specimen Description BLOOD SITE NOT SPECIFIED  Final   Special Requests  Final    BOTTLES DRAWN AEROBIC AND ANAEROBIC Blood Culture results may not be optimal due to an excessive volume of blood received in culture bottles   Culture   Final    NO GROWTH 2 DAYS Performed at [facility], [address] [place], [place] [address]    Report Status PENDING  Incomplete     CT Head Wo Contrast  Result Date: 09/02/2021 CLINICAL DATA:  Mental status changes EXAM: CT HEAD WITHOUT CONTRAST TECHNIQUE: Contiguous axial images were obtained from the base of the skull through the vertex without intravenous contrast. RADIATION DOSE REDUCTION: This exam was performed according to the departmental dose-optimization program which includes automated exposure control, adjustment of the mA and/or kV according to patient size and/or use of iterative reconstruction technique. COMPARISON:  07/12/2021 FINDINGS: Brain: No evidence of acute infarction, hemorrhage, hydrocephalus, extra-axial collection or  mass lesion/mass effect. Vascular: No hyperdense vessel or unexpected calcification. Skull: Normal. Negative for fracture or focal lesion. Sinuses/Orbits: Paranasal sinuses and mastoid air cells are clear. Other: None IMPRESSION: 1. No acute intracranial abnormalities. 2. Normal brain for age. Electronically Signed   By: Signa Kell M.D.   On: 09/02/2021 15:35   MR BRAIN WO CONTRAST  Result Date: 09/02/2021 CLINICAL DATA:  Altered mental status EXAM: MRI HEAD WITHOUT CONTRAST TECHNIQUE: Multiplanar, multiecho pulse sequences of the brain and surrounding structures were obtained without intravenous contrast. COMPARISON:  No prior MRI, correlation is made with CT head 09/02/2021 FINDINGS: Evaluation is somewhat limited by motion artifact. Brain: No restricted diffusion to suggest acute or subacute infarct. No acute hemorrhage, mass, mass effect, or midline shift. No hydrocephalus or extra-axial collection. No foci of hemosiderin deposition to suggest remote hemorrhage. Scattered T2 hyperintense signal in the periventricular white matter, likely the sequela of mild chronic small vessel ischemic disease. Vascular: Normal flow voids. Skull and upper cervical spine: Normal marrow signal. Sinuses/Orbits: Clear paranasal sinuses. The orbits are unremarkable. Other: None. IMPRESSION: No acute intracranial process. Electronically Signed   By: Wiliam Ke M.D.   On: 09/02/2021 19:30   DG Chest Port 1 View  Result Date: 09/02/2021 CLINICAL DATA:  Cough EXAM: PORTABLE CHEST 1 VIEW COMPARISON:  July 12, 2021 and CT of the chest on the same day FINDINGS: Heart is borderline. As before, the heart size and mediastinal contours are within normal limits. Again seen is the retrocardiac infiltrate without significant interval change. Visualized skeletal structures are unremarkable. IMPRESSION: Retrocardiac infiltrate and was seen in the previous study as well. Electronically Signed   By: Marjo Bicker M.D.   On: 09/02/2021  14:32   EEG adult  Result Date: 09/03/2021 Charlsie Quest, MD     09/03/2021  8:23 AM Patient Name: Cardell Peach. MRN: [id number] Epilepsy Attending: Charlsie Quest Referring Physician/Provider: Tyrone Nine, MD Date: 09/02/2021 Duration: 22.57 mins Patient history: 71yo M with ams. EEG to evaluate for seizure Level of alertness: lethargic AEDs during EEG study: None Technical aspects: This EEG study was done with scalp electrodes positioned according to the 10-20 International system of electrode placement. Electrical activity was acquired at a sampling rate of 500Hz  and reviewed with a high frequency filter of 70Hz  and a low frequency filter of 1Hz . EEG data were recorded continuously and digitally stored. Description: No posterior dominant rhythm was seen. EEG showed continuous generalized 3 to 5 Hz theta-delta slowing. Hyperventilation and photic stimulation were not performed.   ABNORMALITY - Continuous slow, generalized IMPRESSION: This study is  suggestive of moderate to severe diffuse encephalopathy, nonspecific etiology. No seizures or epileptiform discharges were seen throughout the recording. Priyanka Annabelle Harman    Family Communication: None at bedside. Will speak with HCPOA.  Disposition: Status is: Inpatient Remains inpatient appropriate because: Persistent AMS Planned Discharge Destination: Home  Tyrone Nine, MD 09/04/2021 11:16 AM Page by [email or web address]

## 2021-09-04 NOTE — Progress Notes (Signed)
Speech Language Pathology Treatment: Dysphagia  ?Patient Details ?Name: Daniel Bailey. ?MRN: [id number] ?DOB: Jul 11, 1950 ?Today's Date: 09/04/2021 ?Time: 1517-6160 ?SLP Time Calculation (min) (ACUTE ONLY): 14 min ? ?Assessment / Plan / Recommendation ?Clinical Impression ? Pt was seen for dysphagia treatment. Seth Bake, RN reported that the pt tolerated meds crushed, but required prompts to swallow. Pt's alertness remains poor and he continues to exhibit reduced bolus awareness. Pt's presentation appears somewhat worse than when he was initially seen on 4/29, and closer to that noted during the latter part of the evaluation. Pt exhibited oral holding across consistencies, but this was most significant with ice chips and puree, and pt did not swallow either of these boluses despite prompts. Pt demonstrated coughing with larger boluses of thin liquids and with nectar thick liquids via straw. Small boluses of thin liquids via straw were tolerated with overt s/sx of aspiration once his alertness was optimized, and the straw was withdrawn to reduce bolus size. Pt's mentation is a significant barrier to diet initiation, and it is recommended that his NPO status be continued with allowance of small, individual boluses of thin liquids via straw, and meds crushed in puree. SLP will follow to assess improvement in swallow function, but it is anticipated that short-term enteral nutrition may be necessary. SLP will continue to follow pt.   ?  ?q HPI: Daniel Bailey. is a 71 y.o. male admitted with pna and resulting MS. CT abdomen  in March shows Left lower lobe pneumonia, suspect underlying aspiration given  fluid distended esophagus and left lower lobe airway opacification. Pt was seen by SLP in March 2023 and given reflux precautions given similar finding. Pt with medical history of intellectual disability, T2DM, HLD, anemia, recent aspiration pneumonia admission who presented with worsening alteration of mental status in the setting  of recent cough. History given by guardian, pt's cousin, as pt is somnolent from receiving IM geodon earlier for agitation. He developed a cough a few days prior but had no fever or evidence of dyspnea. Then yesterday the guardian's son, with whom he lives, reported that the patient, who is normally quite verbose was not speaking very much, then was unable to even get words out to answer questions, was at times answering questions incorrectly. ?  ?   ?SLP Plan ? Continue with current plan of care ? ?  ?  ?Recommendations for follow up therapy are one component of a multi-disciplinary discharge planning process, led by the attending physician.  Recommendations may be updated based on patient status, additional functional criteria and insurance authorization. ?  ? ?Recommendations  ?Diet recommendations: NPO (pt may have small, individual boluses of water via straw if alert) ?Medication Administration: Crushed with puree ?Compensations: Slow rate;Small sips/bites  ?   ?    ?   ? ? ? ? Oral Care Recommendations: Oral care QID;Oral care prior to ice chip/H20 ?Follow Up Recommendations: Skilled nursing-short term rehab (<3 hours/day) ?Assistance recommended at discharge: Frequent or constant Supervision/Assistance ?SLP Visit Diagnosis: Dysphagia, unspecified (R13.10) ?Plan: Continue with current plan of care ? ? ? ? ?  ?  ?Latausha Flamm I. Hardin Negus, [place], CCC-SLP ?Acute Rehabilitation Services ?Office number [phone] ?Pager [phone] ? ? ?Daniel Bailey ? ?09/04/2021, 11:05 AM ? ? ? ?

## 2021-09-04 NOTE — Plan of Care (Signed)
  Problem: Clinical Measurements: Goal: Ability to maintain clinical measurements within normal limits will improve Outcome: Progressing   Problem: Safety: Goal: Ability to remain free from injury will improve Outcome: Progressing   

## 2021-09-05 ENCOUNTER — Inpatient Hospital Stay (HOSPITAL_COMMUNITY): Payer: Medicare Other

## 2021-09-05 ENCOUNTER — Other Ambulatory Visit: Payer: Self-pay

## 2021-09-05 DIAGNOSIS — E119 Type 2 diabetes mellitus without complications: Secondary | ICD-10-CM | POA: Diagnosis not present

## 2021-09-05 DIAGNOSIS — K219 Gastro-esophageal reflux disease without esophagitis: Secondary | ICD-10-CM | POA: Diagnosis not present

## 2021-09-05 DIAGNOSIS — N179 Acute kidney failure, unspecified: Secondary | ICD-10-CM | POA: Diagnosis not present

## 2021-09-05 DIAGNOSIS — R001 Bradycardia, unspecified: Secondary | ICD-10-CM

## 2021-09-05 DIAGNOSIS — I10 Essential (primary) hypertension: Secondary | ICD-10-CM | POA: Diagnosis not present

## 2021-09-05 LAB — URINALYSIS, COMPLETE (UACMP) WITH MICROSCOPIC
Bacteria, UA: NONE SEEN
Bilirubin Urine: NEGATIVE
Glucose, UA: NEGATIVE mg/dL
Hgb urine dipstick: NEGATIVE
Ketones, ur: NEGATIVE mg/dL
Leukocytes,Ua: NEGATIVE
Nitrite: NEGATIVE
Protein, ur: 30 mg/dL — AB
Specific Gravity, Urine: 1.012 (ref 1.005–1.030)
pH: 6 (ref 5.0–8.0)

## 2021-09-05 LAB — BASIC METABOLIC PANEL
Anion gap: 7 (ref 5–15)
BUN: 12 mg/dL (ref 8–23)
CO2: 24 mmol/L (ref 22–32)
Calcium: 9.1 mg/dL (ref 8.9–10.3)
Chloride: 108 mmol/L (ref 98–111)
Creatinine, Ser: 1.48 mg/dL — ABNORMAL HIGH (ref 0.61–1.24)
GFR, Estimated: 50 mL/min — ABNORMAL LOW (ref >60)
Glucose, Bld: 100 mg/dL — ABNORMAL HIGH (ref 70–99)
Potassium: 4.1 mmol/L (ref 3.5–5.1)
Sodium: 139 mmol/L (ref 135–145)

## 2021-09-05 LAB — GLUCOSE, CAPILLARY
Glucose-Capillary: 113 mg/dL — ABNORMAL HIGH (ref 70–99)
Glucose-Capillary: 80 mg/dL (ref 70–99)
Glucose-Capillary: 93 mg/dL (ref 70–99)
Glucose-Capillary: 99 mg/dL (ref 70–99)

## 2021-09-05 MED ORDER — IOHEXOL 300 MG/ML  SOLN: 50.0000 mL | Freq: Once | INTRAMUSCULAR | Status: DC | PRN

## 2021-09-05 NOTE — Progress Notes (Addendum)
?  X-cover Note: ?Bedside RN sent secure chat to me.  Pt with bradycardia. HR in the 30s. Cardiology aware. Hypothermic this evening with rectal temp 94.8.  RN placing warm blankets on him. ? ?Will check cortisol level. ? ? ?Kristopher Oppenheim, DO ?[facility] ? ? ? ?**update.  Noted that cortisol level already check yesterday and it was normal. ?

## 2021-09-05 NOTE — Progress Notes (Signed)
?Cardiology Consultation:  ? ?Patient ID: Daniel Bailey. ?MRN: [id number]; DOB: 15-Jun-1950 ? ?Admit date: 09/02/2021 ?Date of Consult: 09/05/2021 ? ?PCP:  [name], MD ?  ?[facility] ?Cardiologist:  Early Osmond, MD      ? ? ?Patient Profile:  ? ?Daniel Bailey. is a 71 y.o. male with a hx of bradycardia, aortic atherosclerosis, hypertension, hyperlipidemia and diabetes who is being seen 09/05/2021 for the evaluation of bradycardia at the request of Dr. Auburn Bilberry. ? ?History of Present Illness:  ? ?Mr. Karman was admitted 4/28 with pneumonia and encephalopathy. He has baseline intellectual disability and presented with superimposed encephalopathy.  He was found to have LLL pneumonia, likely 2/2 aspiration.  He is currently being treated with ceftriaxone and azithromycin.  Cardiology was consulted for bradycardia.  Of note, he has OSA and has not yet been fitted for CPAP.  He currently denies chest pain.  He ocntinues to have a non-productive cough.  He denies lightheadedness or dizziness but has not been out of the bed.  ? ?He saw Dr. Ali Lowe on 08/11/21 for bradycardia.  He had seen his PCP and was referred to cardiology for heart rates in the 30-40s.  At the time he was not symptomatic and was not bradycardic in the office.  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  He was on midodrine for hypotension but was otherwise asymptomatic.  Thyroid function was wnl. He has a history of GI bleed and hospitalization for aspiroation PNA. ? ?Past Medical History:  ?Diagnosis Date  ? Anemia   ? Elevated PSA   ? Frequent urination   ? Glaucoma   ? History of COVID-19   ? Hyperglycemia   ? Hyperlipidemia   ? Hypertension   ? Sleep apnea   ? ? ?Past Surgical History:  ?Procedure Laterality Date  ? COLONOSCOPY    ? PROSTATE BIOPSY N/A 11/20/2017  ? Procedure: BIOPSY TRANSRECTAL ULTRASONIC PROSTATE (TUBP);  Surgeon: Abbie Sons, MD;  Location: [facility] ORS;  Service: Urology;  Laterality: N/A;  ?  ? ?Home Medications:   ?Prior to Admission medications   ?Medication Sig Start Date End Date Taking? Authorizing Provider  ?acetaminophen (TYLENOL) 500 MG tablet Take 1,000 mg by mouth daily as needed for headache.   Yes [provider]  ?atorvastatin (LIPITOR) 10 MG tablet Take 1 tablet (10 mg total) by mouth daily. In the evening 08/11/20  Yes Tower, Wynelle Fanny, MD  ?gemfibrozil (LOPID) 600 MG tablet Take 1 tablet (600 mg total) by mouth 2 (two) times daily. 08/11/20  Yes Tower, Wynelle Fanny, MD  ?metFORMIN (GLUCOPHAGE-XR) 500 MG 24 hr tablet Take 2 tablets (1,000 mg total) by mouth daily with breakfast. 08/11/20  Yes Tower, Wynelle Fanny, MD  ?midodrine (PROAMATINE) 5 MG tablet Take 1 tablet (5 mg total) by mouth 3 (three) times daily with meals. 08/29/21  Yes Tower, Wynelle Fanny, MD  ?pantoprazole (PROTONIX) 40 MG tablet Take 1 tablet (40 mg total) by mouth daily. 07/19/21  Yes Hongalgi, Lenis Dickinson, MD  ? ? ?Inpatient Medications: ?Scheduled Meds: ? enoxaparin (LOVENOX) injection  40 mg Subcutaneous Q24H  ? midodrine  5 mg Oral TID WC  ? sodium chloride flush  3 mL Intravenous Q12H  ? ?Continuous Infusions: ? azithromycin 500 mg (09/04/21 1844)  ? cefTRIAXone (ROCEPHIN)  IV 1 g (09/04/21 1847)  ? dextrose 5% lactated ringers 100 mL/hr at 09/05/21 1213  ? ?PRN Meds: ?acetaminophen **OR** acetaminophen, iohexol, ondansetron **OR** ondansetron (ZOFRAN)  IV ? ?Allergies:   No Known Allergies ? ?Social History:   ?Social History  ? ?Socioeconomic History  ? Marital status: Married  ?  Spouse name: Not on file  ? Number of children: Not on file  ? Years of education: Not on file  ? Highest education level: Not on file  ?Occupational History  ? Not on file  ?Tobacco Use  ? Smoking status: Never  ? Smokeless tobacco: Never  ?Vaping Use  ? Vaping Use: Never used  ?Substance and Sexual Activity  ? Alcohol use: No  ?  Alcohol/week: 0.0 standard drinks  ? Drug use: No  ? Sexual activity: Not Currently  ?Other Topics Concern  ? Not on file  ?Social History Narrative   ? Not on file  ? ?Social Determinants of Health  ? ?Financial Resource Strain: Low Risk   ? Difficulty of Paying Living Expenses: Not hard at all  ?Food Insecurity: No Food Insecurity  ? Worried About Charity fundraiser in the Last Year: Never true  ? Ran Out of Food in the Last Year: Never true  ?Transportation Needs: No Transportation Needs  ? Lack of Transportation (Medical): No  ? Lack of Transportation (Non-Medical): No  ?Physical Activity: Inactive  ? Days of Exercise per Week: 0 days  ? Minutes of Exercise per Session: 0 min  ?Stress: No Stress Concern Present  ? Feeling of Stress : Not at all  ?Social Connections: Not on file  ?Intimate Partner Violence: Not At Risk  ? Fear of Current or Ex-Partner: No  ? Emotionally Abused: No  ? Physically Abused: No  ? Sexually Abused: No  ?  ?Family History:   ? ?Family History  ?Problem Relation Age of Onset  ? Colon cancer Mother   ? Kidney disease Mother   ? Cancer Father   ?     unknown  ?  ? ?ROS:  ?Please see the history of present illness.  ?All other ROS reviewed and negative.    ? ?Physical Exam/Data:  ? ?Vitals:  ? 09/05/21 0349 09/05/21 0529 09/05/21 0851 09/05/21 1318  ?BP: 139/85 131/87 126/69 134/83  ?Pulse: 60 (!) 55 (!) 52 (!) 44  ?Resp: '16 14 17 18  '$ ?Temp: (!) 97.5 ?F (36.4 ?C) (!) 97.4 ?F (36.3 ?C)    ?TempSrc: Oral Oral    ?SpO2: 98% 98% 97% 100%  ?Weight:  84.5 kg    ? ? ?Intake/Output Summary (Last 24 hours) at 09/05/2021 1620 ?Last data filed at 09/05/2021 1000 ?Gross per 24 hour  ?Intake 1624.37 ml  ?Output 1000 ml  ?Net 624.37 ml  ? ? ?  09/05/2021  ?  5:29 AM 09/03/2021  ?  9:04 PM 09/03/2021  ?  3:57 AM  ?Last 3 Weights  ?Weight (lbs) 186 lb 4.6 oz 188 lb 7.9 oz 186 lb 1.1 oz  ?Weight (kg) 84.5 kg 85.5 kg 84.4 kg  ? ?VS:  BP 114/69 (BP Location: Right Arm)   Pulse (!) 41   Temp (!) 97.4 ?F (36.3 ?C) (Oral)   Resp 17   Wt 84.5 kg   SpO2 98%   BMI 29.62 kg/m?  , BMI Body mass index is 29.62 kg/m?. ?GENERAL:  Well appearing ?HEENT: Pupils equal  round and reactive, fundi not visualized, oral mucosa unremarkable ?NECK:  No jugular venous distention, waveform within normal limits, carotid upstroke brisk and symmetric, no bruits, no thyromegaly ?LUNGS:  Clear to auscultation bilaterally ?HEART:  RRR.  PMI not displaced  or sustained,S1 and S2 within normal limits, no S3, no S4, no clicks, no rubs, no murmurs ?ABD:  Flat, positive bowel sounds normal in frequency in pitch, no bruits, no rebound, no guarding, no midline pulsatile mass, no hepatomegaly, no splenomegaly ?EXT:  2 plus pulses throughout, no edema, no cyanosis no clubbing ?SKIN:  No rashes no nodules ?NEURO:  Cranial nerves II through XII grossly intact, motor grossly intact throughout ?PSYCH:  Cognitively intact, oriented to person place and time ? ? ?EKG:  The EKG was personally reviewed and demonstrates:  sinus bradycardia.  Rate 38 bpm.  First degree AV block.  ?Telemetry:  Telemetry was personally reviewed and demonstrates:  Sinus bradycardia.  Rate 30-50s ? ?Relevant CV Studies: ?Echo 08/26/21: ? 1. Left ventricular ejection fraction, by estimation, is 60 to 65%. Left  ?ventricular ejection fraction by 3D volume is 64 %. The left ventricle has  ?normal function. The left ventricle has no regional wall motion  ?abnormalities. There is mild left  ?ventricular hypertrophy. Left ventricular diastolic parameters are  ?consistent with Grade I diastolic dysfunction (impaired relaxation).  ? 2. Right ventricular systolic function is normal. The right ventricular  ?size is normal. There is normal pulmonary artery systolic pressure. The  ?estimated right ventricular systolic pressure is 16.3 mmHg.  ? 3. Right atrial size was mildly dilated.  ? 4. The pericardial effusion is posterior to the left ventricle.  ? 5. The mitral valve is abnormal. Trivial mitral valve regurgitation.  ? 6. The aortic valve is tricuspid. Aortic valve regurgitation is trivial.  ?Aortic valve sclerosis is present, with no evidence  of aortic valve  ?stenosis. Aortic regurgitation PHT measures 506 msec.  ? 7. The inferior vena cava is normal in size with greater than 50%  ?respiratory variability, suggesting right atrial pressure of 3 mmHg.

## 2021-09-05 NOTE — Progress Notes (Signed)
Tele monitoring called and stated pt HR went down to low 30s. Then stayed around high 30s. Went to check on pt PT was transferring pt after exercising when she said he got very lethargic and SOB. When I assessed pt he was in the bed not in distressed, showed no signs of SOB. Dr. Bonner Puna was notified ECG and VS obtained and in chart.  ?

## 2021-09-05 NOTE — TOC Initial Note (Addendum)
Transition of Care (TOC) - Initial/Assessment Note  ? ? ?Patient Details  ?Name: Daniel Bailey. ?MRN: [id number] ?Date of Birth: 30-May-1950 ? ?Transition of Care (TOC) CM/SW Contact:    ?Tom-Johnson, Renea Ee, RN ?Phone Number: ?09/05/2021, 3:08 PM ? ?Clinical Narrative:                 ? ?Patient is admitted for Acute Encephalopathy and found to have Pneumonia. Patient is Mentally challenged and has a Garment/textile technologist, Daniel Bailey. CM called and spoke with Daniel Bailey about discharge disposition. Daniel Bailey states patient was recently admitted to the hospital in March and had home health disciplines at discharge. States she would like to continue PT/OT disciplines with [facility]. [place] with [place] notified and acceptance voiced. Info on AVS. Wheelchair and BSC ordered from [place] to deliver at bedside. ?Patient is from home and Daniel Bailey's son lives with patient and assist him with his care.  ?Daniel Bailey notified CM that patient was prescribed CPAP from his Pulmonologist but has not yet received it from [place]. CM to follow up with [facility]. ?Family to transport at discharge. No further TOC needs noted. ? ?16:15- Update: CM received a call from Daniel Bailey, Patient's legal guardian that patient does not need BSC. [place] with [facility] notified. ?CM contacted Zach with [facility] about CPAP and Thedore Mins stated they are working on getting him scheduled.  ?CM will continue to follow with needs. ? ? ?Expected Discharge Plan: [place] ?Barriers to Discharge: Continued Medical Work up ? ? ?Patient Goals and CMS Choice ?Patient states their goals for this hospitalization and ongoing recovery are:: To return home ?CMS Medicare.gov Compare Post Acute Care list provided to:: [name]) ?Choice offered to / list presented to : [facility] POA / Guardian Daniel Bailey) ? ?Expected Discharge Plan and Services ?Expected Discharge Plan: [place] ?  ?Discharge Planning Services: CM Consult ?Post Acute Care Choice:  Home Health ?  ?                ?DME Arranged: 3-N-1, Wheelchair manual ?DME Agency: [facility] ?Date DME Agency Contacted: 09/05/21 ?Time DME Agency Contacted: [id number] ?Representative spoke with at DME Agency: Delana Meyer ?HH Arranged: RN, PT ?[place] Agency: [place] ?Date HH Agency Contacted: 09/05/21 ?Time [place]: 1287 ?Representative spoke with at [place] Agency: [place] (Resumption of care) ? ?Prior Living Arrangements/Services ?  ?Lives with:: Relatives ([place]) ?Patient language and need for interpreter reviewed:: Yes ?Do you feel safe going back to the place where you live?: Yes      ?Need for Family Participation in Patient Care: Yes (Comment) ?Care giver support system in place?: Yes (comment) ?Current home services: Home PT, Home OT, DME ?Criminal Activity/Legal Involvement Pertinent to Current Situation/Hospitalization: No - Comment as needed ? ?Activities of Daily Living ?  ?  ? ?Permission Sought/Granted ?Permission sought to share information with : Case Manager, Customer service manager, Guardian, Family Supports ?Permission granted to share information with : Yes, Verbal Permission Granted ?   ?   ?   ?   ? ?Emotional Assessment ?Appearance:: Appears stated age ?Attitude/Demeanor/Rapport: Engaged, Gracious ?Affect (typically observed): Accepting, Appropriate, Calm, Hopeful ?Orientation: : Oriented to Self, Oriented to Place, Oriented to Situation ?Alcohol / Substance Use: Not Applicable ?Psych Involvement: No (comment) ? ?Admission diagnosis:  Acute encephalopathy [G93.40] ?Altered mental status, unspecified altered mental status type [R41.82] ?Patient Active Problem List  ? Diagnosis Date Noted  ? Acute encephalopathy 09/02/2021  ?  GERD (gastroesophageal reflux disease) 09/02/2021  ? Normal anion gap metabolic acidosis 49/20/1007  ? Hypoalbuminemia 09/02/2021  ? Right bundle branch block 08/12/2021  ? Right ventricular hypertrophy 08/09/2021  ? Bradycardia 08/09/2021  ? Urinary  frequency 08/09/2021  ? OSA (obstructive sleep apnea) 07/16/2021  ? Lactic acidosis 07/13/2021  ? Acute metabolic encephalopathy 04/26/7587  ? Aspiration pneumonia ([place])   ? AKI (acute kidney injury) ([place])   ? Decreased GFR 05/26/2021  ? Normocytic anemia 05/26/2021  ? Cerumen impaction 05/17/2021  ? Frequent urination 05/17/2021  ? Tremor 02/09/2021  ? Generalized weakness 02/09/2021  ? Elevated PSA 09/05/2017  ? Leukocytopenia 09/01/2016  ? Mixed hyperlipidemia due to type 2 diabetes mellitus ([place]) 02/19/2014  ? Encounter for Medicare annual wellness exam 08/27/2012  ? Obesity 08/18/2011  ? Colon cancer screening 08/18/2011  ? Routine general medical examination at a health care facility 08/10/2011  ? Prostate cancer screening 08/10/2011  ? Controlled type 2 diabetes mellitus without complication, without long-term current use of insulin ([place]) 05/30/2010  ? HYPERTENSION, BENIGN ESSENTIAL 12/10/2006  ? Intellectual disability 11/23/2006  ? ?PCP:  [name], MD ?Pharmacy:   ?[facility] - [place], [place] - [address], [address] ?[address], [address] ?[place] [address] ?Phone: [phone] Fax: [phone] ? ?[id number] [place], [place] - [address]?[place]?Phone: [phone]: [phone]? ? ? ? ?Social Determinants of Health (SDOH) Interventions ?  ? ?Readmission Risk Interventions ?   ? View : No data to display.  ?  ?  ?  ? ? ? ?

## 2021-09-05 NOTE — Progress Notes (Signed)
Occupational Therapy Treatment ?Patient Details ?Name: Daniel Bailey. ?MRN: [id number] ?DOB: 07/09/1950 ?Today's Date: 09/05/2021 ? ? ?History of present illness The pt is a 71 yo male presenting 4/28 with AMS and possible aphasia for about 2 days. Pt suspected to have acute encephalopathy due to infection and LLL PNA. PMH includes: intellectual disability, DM II, HLD, HTN, anemia, and recent aspiration PNA. ?  ?OT comments ? Patient making good progress with mod assist to get to EOB and min guard for sitting balance. Patient stood from EOB with RW and performed step pivot transfer with mod assist. Grooming tasks performed with one step commands and mod assist for changing socks. Patient was pleasant and answered most questions with yes, no, or okay. Acute OT to continue to follow.   ? ?Recommendations for follow up therapy are one component of a multi-disciplinary discharge planning process, led by the attending physician.  Recommendations may be updated based on patient status, additional functional criteria and insurance authorization. ?   ?Follow Up Recommendations ? Skilled nursing-short term rehab (<3 hours/day)  ?  ?Assistance Recommended at Discharge Frequent or constant Supervision/Assistance  ?Patient can return home with the following ? A lot of help with walking and/or transfers;A lot of help with bathing/dressing/bathroom;Assistance with cooking/housework ?  ?Equipment Recommendations ? BSC/3in1;Wheelchair (measurements OT)  ?  ?Recommendations for Other Services   ? ?  ?Precautions / Restrictions Precautions ?Precautions: Fall ?Restrictions ?Weight Bearing Restrictions: No  ? ? ?  ? ?Mobility Bed Mobility ?Overal bed mobility: Needs Assistance ?Bed Mobility: Supine to Sit ?  ?  ?Supine to sit: Mod assist, HOB elevated ?  ?  ?General bed mobility comments: assistance to initiate with assistance for LEs and scooting foward on bed ?  ? ?Transfers ?Overall transfer level: Needs assistance ?Equipment used:  Rolling walker (2 wheels) ?Transfers: Sit to/from Stand, Bed to chair/wheelchair/BSC ?Sit to Stand: Mod assist ?  ?  ?Step pivot transfers: Mod assist ?  ?  ?General transfer comment: patient unsteady with no knee buckling.  Mod assist to steady for safety ?  ?  ?Balance Overall balance assessment: Needs assistance ?Sitting-balance support: No upper extremity supported, Feet unsupported ?Sitting balance-Leahy Scale: Poor ?Sitting balance - Comments: min guard for sitting balance ?  ?Standing balance support: Bilateral upper extremity supported, During functional activity ?Standing balance-Leahy Scale: Poor ?Standing balance comment: reliant on RW for balance ?  ?  ?  ?  ?  ?  ?  ?  ?  ?  ?  ?   ? ?ADL either performed or assessed with clinical judgement  ? ?ADL Overall ADL's : Needs assistance/impaired ?  ?  ?Grooming: Wash/dry hands;Wash/dry face;Brushing hair;Minimal assistance;Sitting ?Grooming Details (indicate cue type and reason): min assist with  combing hair ?  ?  ?  ?  ?  ?  ?Lower Body Dressing: Moderate assistance;Sitting/lateral leans ?Lower Body Dressing Details (indicate cue type and reason): able to doff socks required mod assist to donn ?Toilet Transfer: Rolling walker (2 wheels);Moderate assistance ?Toilet Transfer Details (indicate cue type and reason): simulated to recliner ?  ?  ?  ?  ?  ?General ADL Comments: followed one step commands with increased time ?  ? ?Extremity/Trunk Assessment   ?  ?  ?  ?  ?  ? ?Vision   ?  ?  ?Perception   ?  ?Praxis   ?  ? ?Cognition Arousal/Alertness: Awake/alert ?Behavior During Therapy: Flat affect ?Overall Cognitive Status: History of cognitive impairments -  at baseline ?Area of Impairment: Attention, Following commands, Safety/judgement, Awareness, Problem solving ?  ?  ?  ?  ?  ?  ?  ?  ?  ?Current Attention Level: Focused ?  ?Following Commands: Follows one step commands inconsistently, Follows one step commands with increased time ?Safety/Judgement:  Decreased awareness of safety, Decreased awareness of deficits ?Awareness: Intellectual ?Problem Solving: Slow processing, Requires verbal cues ?General Comments: answered questions with yes, no, or okay.  increased time to following directions, cues often required repeating ?  ?  ?   ?Exercises   ? ?  ?Shoulder Instructions   ? ? ?  ?General Comments    ? ? ?Pertinent Vitals/ Pain       Pain Assessment ?Pain Assessment: No/denies pain ? ?Home Living   ?  ?  ?  ?  ?  ?  ?  ?  ?  ?  ?  ?  ?  ?  ?  ?  ?  ?  ? ?  ?Prior Functioning/Environment    ?  ?  ?  ?   ? ?Frequency ? Min 2X/week  ? ? ? ? ?  ?Progress Toward Goals ? ?OT Goals(current goals can now be found in the care plan section) ? Progress towards OT goals: Progressing toward goals ? ?Acute Rehab OT Goals ?OT Goal Formulation: Patient unable to participate in goal setting ?Time For Goal Achievement: 09/17/21 ?Potential to Achieve Goals: Good ?ADL Goals ?Pt Will Perform Grooming: with supervision;standing ?Pt Will Perform Lower Body Dressing: with supervision;sit to/from stand ?Pt Will Transfer to Toilet: with supervision;ambulating ?Additional ADL Goal #1: Pt will follow 2-3 step commands >75% during ADL completion.  ?Plan Discharge plan remains appropriate   ? ?Co-evaluation ? ? ?   ?  ?  ?  ?  ? ?  ?AM-PAC OT "6 Clicks" Daily Activity     ?Outcome Measure ? ? Help from another person eating meals?: A Lot ?Help from another person taking care of personal grooming?: A Little ?Help from another person toileting, which includes using toliet, bedpan, or urinal?: Total ?Help from another person bathing (including washing, rinsing, drying)?: A Lot ?Help from another person to put on and taking off regular upper body clothing?: A Lot ?Help from another person to put on and taking off regular lower body clothing?: A Lot ?6 Click Score: 12 ? ?  ?End of Session Equipment Utilized During Treatment: Gait belt;Rolling walker (2 wheels) ? ?OT Visit Diagnosis: Other  abnormalities of gait and mobility (R26.89);Muscle weakness (generalized) (M62.81);Other symptoms and signs involving cognitive function ?  ?Activity Tolerance Patient tolerated treatment well ?  ?Patient Left in chair;with call bell/phone within reach;with chair alarm set ?  ?Nurse Communication Mobility status ?  ? ?   ? ?Time: 9458-5929 ?OT Time Calculation (min): 28 min ? ?Charges: OT General Charges ?$OT Visit: 1 Visit ?OT Treatments ?$Self Care/Home Management : 23-37 mins ? ?Lodema Hong, OTA ?Acute Rehabilitation Services  ?Pager [phone] ?Office [phone] ? ? ?Young ?09/05/2021, 9:02 AM ?

## 2021-09-05 NOTE — Progress Notes (Addendum)
?Progress Note ? ?Patient: Daniel Bailey. VEL:[id number] DOB: Dec 10, 1950  ?DOA: 09/02/2021  DOS: 09/05/2021  ?  ?Brief hospital course: ?Daniel Bailey. is a 71 y.o. male with medical history of intellectual disability, T2DM, HLD, anemia, recent aspiration pneumonia admission who presented with worsening alteration of mental status in the setting of recent cough.  ? ?He was afebrile with leukocytosis, retrocardiac infiltrate on CXR, and mild AKI. EDP reports he had stuttering speech on arrival, had difficulty saying what he seemed to want to say but had nonfocal exam. CT head normal for age. The patient became more agitated ultimately requiring IM geodon. Admission requested for pneumonia complicated by AMS. ? ?Assessment and Plan: ?Acute encephalopathy on baseline intellectual disability: Suspect due to infection, has occurred periodically since November associated with illness. History suggests he's responsive during episodes and has no sustained focal deficits. MRI negative. EEG with nonspecific generalized slowing without seizure/epileptiform discharges. Ammonia, TSH wnl.  ?- this has shown significant improvement, still not at baseline, but able to participate with therapy. His current functional status suggests CIR may be an appropriate disposition. They will be consulted for consideration of admission. ?- Delirium precautions reviewed ?- Monitor with infection treatment. If not improving, may require inpatient neurology evaluation (vs. deferring to outpatient as scheduled) ?- Discuss care with HCPOA daily ?  ?Sinus bradycardia, 1st degree AV block: Developed today without hypotension, though reported some lightheadedness during onset. Not on negative chronotropes.  ?- Cardiology consulted. Again, not hypotensive at this time.  ? ?LLL pneumonia: suspected to be due to aspiration. With SLP evaluations, concern for aspiration is quite high.  ?- MBSS today. Will proceed with diet based on these recommendations when  available. If still very likely to aspirate, will require goals of care discussions with HCPOA again. ?- Continue typical ceftriaxone, azithromycin, planning 5 days (final dose 5/2) ?- Leukocytosis resolved, PCT sustained improvement. ?- Sputum culture and antigens pending ?- Monitor blood cultures (NGTD) ?- prn O2 to maintain SpO2 >89%. Not currently hypoxemic. ?  ?Normocytic anemia: Has had hematology work up in the past. Anemia panel recently showed no deficiency.  ?- Hgb 9.3 g/dl. Required transfusion last hospitalization. If gross GI bleeding develops, would consult GI, though he has scheduled follow up to investigate +FOBT. ?  ?Controlled NIDT2DM:  ?- Monitor q6h, at inpatient goal without hypoglycemia. Hold metformin with history of lactic acidosis.  ?  ?GERD:  ?- Hold PPI to minimize enteral intake/provocation for aspiration. ?  ?HLD:  ?- Hold statin to minimize po intake ?  ?AKI with NAGMA: Improved. Renal U/S indicative of an element of CKD.   ?- Continue monitoring, avoid nephrotoxins. ?  ?OSA:  ?- Pt not yet fitted for DME, but did have recent reportedly positive sleep study. Will monitor O2.  ? ?Thrombocytopenia: Mild and stable. ? ?Subjective: Up to bedside chair, more interactive today, denies hunger or thirst. No chest or other pain. More alert and no agitation or restlessness noted overnight of this AM.  ? ?Objective: ?Vitals:  ? 09/05/21 0349 09/05/21 0529 09/05/21 0851 09/05/21 1318  ?BP: 139/85 131/87 126/69 134/83  ?Pulse: 60 (!) 55 (!) 52 (!) 44  ?Resp: '16 14 17 18  '$ ?Temp: (!) 97.5 ?F (36.4 ?C) (!) 97.4 ?F (36.3 ?C)    ?TempSrc: Oral Oral    ?SpO2: 98% 98% 97% 100%  ?Weight:  84.5 kg    ? ?Gen: 71 y.o. male in no distress ?Pulm: Nonlabored breathing room air. Crackles stable. ?CV: Regular rate  and rhythm. No murmur, rub, or gallop. No JVD, no dependent edema. ?GI: Abdomen soft, non-tender, non-distended, with normoactive bowel sounds.  ?Ext: Warm, no deformities ?Skin: No rashes, lesions or  ulcers on visualized skin. ?Neuro: Alert, interactive, cooperative with exam. One word answers that are appropriate without focal neurological deficits. ?Psych: Judgement and insight appear impaired.   ? ?Data Personally reviewed: ?CBC: ?Recent Labs  ?Lab 09/02/21 ?1517 09/03/21 ?0252 09/04/21 ?0126  ?WBC 13.4* 9.7 6.7  ?NEUTROABS 12.6*  --   --   ?HGB 9.7* 9.7* 9.3*  ?HCT 29.2* 28.3* 27.3*  ?MCV 92.7 92.2 91.3  ?PLT 176 148* 148*  ? ?Basic Metabolic Panel: ?Recent Labs  ?Lab 09/02/21 ?1517 09/03/21 ?0252 09/04/21 ?0126 09/05/21 ?5643  ?NA 136 139 138 139  ?K 4.8 4.4 4.4 4.1  ?CL 105 106 108 108  ?CO2 21* '25 22 24  '$ ?GLUCOSE 84 75 93 100*  ?BUN '18 17 14 12  '$ ?CREATININE 1.48* 1.41* 1.71* 1.48*  ?CALCIUM 9.7 9.5 9.2 9.1  ?MG 1.7  --   --   --   ? ?GFR: ?Estimated Creatinine Clearance: 47.1 mL/min (A) (by C-G formula based on SCr of 1.48 mg/dL (H)). ?Liver Function Tests: ?Recent Labs  ?Lab 09/02/21 ?1517  ?AST 29  ?ALT 15  ?ALKPHOS 96  ?BILITOT 0.6  ?PROT 6.9  ?ALBUMIN 3.1*  ? ?No results for input(s): LIPASE, AMYLASE in the last 168 hours. ?Recent Labs  ?Lab 09/02/21 ?2008  ?AMMONIA 18  ? ?Coagulation Profile: ?No results for input(s): INR, PROTIME in the last 168 hours. ?Cardiac Enzymes: ?No results for input(s): CKTOTAL, CKMB, CKMBINDEX, TROPONINI in the last 168 hours. ?BNP (last 3 results) ?No results for input(s): PROBNP in the last 8760 hours. ?HbA1C: ?No results for input(s): HGBA1C in the last 72 hours. ?CBG: ?Recent Labs  ?Lab 09/04/21 ?1825 09/05/21 ?0000 09/05/21 ?0559 09/05/21 ?0745 09/05/21 ?1207  ?GLUCAP 113* 113* 93 80 99  ? ?Lipid Profile: ?No results for input(s): CHOL, HDL, LDLCALC, TRIG, CHOLHDL, LDLDIRECT in the last 72 hours. ?Thyroid Function Tests: ?Recent Labs  ?  09/02/21 ?2008  ?TSH 2.959  ? ?Anemia Panel: ?No results for input(s): VITAMINB12, FOLATE, FERRITIN, TIBC, IRON, RETICCTPCT in the last 72 hours. ?Urine analysis: ?   ?Component Value Date/Time  ? COLORURINE STRAW (A) 09/02/2021 2135   ? APPEARANCEUR CLEAR 09/02/2021 2135  ? LABSPEC 1.006 09/02/2021 2135  ? PHURINE 5.0 09/02/2021 2135  ? [place] NEGATIVE 09/02/2021 2135  ? HGBUR SMALL (A) 09/02/2021 2135  ? [place] NEGATIVE 09/02/2021 2135  ? BILIRUBINUR neg 08/10/2021 1616  ? [place] NEGATIVE 09/02/2021 2135  ? PROTEINUR 30 (A) 09/02/2021 2135  ? UROBILINOGEN 0.2 08/10/2021 1616  ? NITRITE NEGATIVE 09/02/2021 2135  ? LEUKOCYTESUR NEGATIVE 09/02/2021 2135  ? ?Recent Results (from the past 240 hour(s))  ?Urine Culture     Status: Abnormal  ? Collection Time: 09/02/21  1:45 PM  ? Specimen: Urine, Clean Catch  ?Result Value Ref Range Status  ? Specimen Description URINE, CLEAN CATCH  Final  ? Special Requests NONE  Final  ? Culture (A)  Final  ?  <10,000 COLONIES/mL INSIGNIFICANT GROWTH ?Performed at [facility], [place] [address], [place],  [address] ?  ? Report Status 09/04/2021 FINAL  Final  ?Culture, blood (routine x 2)     Status: None (Preliminary result)  ? Collection Time: 09/02/21  3:55 PM  ? Specimen: BLOOD  ?Result Value Ref Range Status  ? Specimen Description BLOOD SITE NOT  SPECIFIED  Final  ? Special Requests   Final  ?  BOTTLES DRAWN AEROBIC AND ANAEROBIC Blood Culture adequate volume  ? Culture   Final  ?  NO GROWTH 3 DAYS ?Performed at [facility], [place] [address], [place], [place] [address] ?  ? Report Status PENDING  Incomplete  ?Culture, blood (routine x 2)     Status: None (Preliminary result)  ? Collection Time: 09/02/21  3:55 PM  ? Specimen: BLOOD  ?Result Value Ref Range Status  ? Specimen Description BLOOD SITE NOT SPECIFIED  Final  ? Special Requests   Final  ?  BOTTLES DRAWN AEROBIC AND ANAEROBIC Blood Culture results may not be optimal due to an excessive volume of blood received in culture bottles  ? Culture   Final  ?  NO GROWTH 3 DAYS ?Performed at [facility], [place] [address], [place], [place] [address] ?  ? Report Status PENDING  Incomplete  ?   ?US RENAL ? ?Result Date:  09/04/2021 ?CLINICAL DATA:  71 year old male with acute kidney injury. EXAM: RENAL / URINARY TRACT ULTRASOUND COMPLETE COMPARISON:  07/12/2021 CT FINDINGS: Right Kidney: Renal measurements: 10.9 x 4.4 x 5 cm = volume: 125 m

## 2021-09-05 NOTE — Progress Notes (Signed)
Pt off the unit.

## 2021-09-05 NOTE — Progress Notes (Signed)
Speech Language Pathology Treatment: Dysphagia  ?Patient Details ?Name: Daniel Bailey. ?MRN: [id number] ?DOB: [birth year] ?Today's Date: 09/05/2021 ?Time: 1000-1010 ?SLP Time Calculation (min) (ACUTE ONLY): 10 min ? ?Assessment / Plan / Recommendation ?Clinical Impression ? Mentation significantly improved, pt up in chair, able to self feed and sit up in chair. Pt still shows significant difficulty swallowing with immediate coughing with liquids, severity increases with intake. Oral holding also still observed. Pt can now participate in [place]. Will proceed with study today at 1330.   ?HPI   ?  ?   ?SLP Plan ? MBS ? ?  ?  ?Recommendations for follow up therapy are one component of a multi-disciplinary discharge planning process, led by the attending physician.  Recommendations may be updated based on patient status, additional functional criteria and insurance authorization. ?  ? ?Recommendations  ?Diet recommendations: NPO ?Medication Administration: Crushed with puree ?Compensations: Slow rate;Small sips/bites  ?   ?    ?   ? ? ? ? Oral Care Recommendations: Oral care QID;Oral care prior to ice chip/H20 ?Follow Up Recommendations: Skilled nursing-short term rehab (<3 hours/day) ?Assistance recommended at discharge: Frequent or constant Supervision/Assistance ?Plan: MBS ? ? ? ? ?  ?  ? ? ?Auron Tadros, Katherene Ponto ? ?09/05/2021, 10:21 AM ?

## 2021-09-05 NOTE — Progress Notes (Signed)
Physical Therapy Treatment ?Patient Details ?Name: Daniel Bailey. ?MRN: [id number] ?DOB: [birth year] ?Today's Date: 09/05/2021 ? ? ?History of Present Illness The pt is a 71 yo male presenting 4/28 with AMS and possible aphasia for about 2 days. Pt suspected to have acute encephalopathy due to infection and LLL PNA. PMH includes: intellectual disability, DM II, HLD, HTN, anemia, and recent aspiration PNA. ? ?  ?PT Comments  ? ? Continuing work on functional mobility and activity tolerance;  PT with good participation and seemed to enjoy seated therex; Required Mod assist for sit to stand and step pivot transfers; after step pivot transfer chair back to bed, noted pt had a brief moment where he lost his balance to the R, eyes closed; episode was brief, lasting approx 5-10 seconds, then eyes back open; likely related to episode of bradycardia, discussed with Candice, RN (please refer to her note of this date as well;  ? ?Daniel Bailey is participating, was able to walk (sometiems without RW), and participate in light chores; he has excellent assist in the home, and is making modest, but notable progress; worth considering AIR to maximize independence and safety with mobility and ADLs in prep for getting home  ?Recommendations for follow up therapy are one component of a multi-disciplinary discharge planning process, led by the attending physician.  Recommendations may be updated based on patient status, additional functional criteria and insurance authorization. ? ?Follow Up Recommendations ? Acute inpatient rehab (3hours/day) ([facility] prefers HHPT; I didn't get a chance to discuss, but I wander if they would agree to AIR for rehab) ?  ?  ?Assistance Recommended at Discharge Frequent or constant Supervision/Assistance  ?Patient can return home with the following Two people to help with walking and/or transfers;Two people to help with bathing/dressing/bathroom;Assistance with cooking/housework;Assistance with feeding;Direct  supervision/assist for medications management;Direct supervision/assist for financial management;Assist for transportation;Help with stairs or ramp for entrance ?  ?Equipment Recommendations ? Wheelchair (measurements PT);Wheelchair cushion (measurements PT);BSC/3in1  ?  ?Recommendations for Other Services   ? ? ?  ?Precautions / Restrictions Precautions ?Precautions: Fall  ?  ? ?Mobility ? Bed Mobility ?Overal bed mobility: Needs Assistance ?Bed Mobility: Sit to Supine ?  ?  ?Supine to sit: Min assist ?  ?  ?General bed mobility comments: Min assist to help LEs into bed ?  ? ?Transfers ?Overall transfer level: Needs assistance ?Equipment used: 1 person hand held assist ?Transfers: Sit to/from Stand, Bed to chair/wheelchair/BSC ?Sit to Stand: Mod assist ?  ?Step pivot transfers: Mod assist ?  ?  ?  ?General transfer comment: Mod assist to power up and stabilize; patient unsteady with no knee buckling.  Mod assist to steady for safety ?  ? ?Ambulation/Gait ?  ?  ?  ?  ?  ?  ?  ?  ? ? ?Stairs ?  ?  ?  ?  ?  ? ? ?Wheelchair Mobility ?  ? ?Modified Rankin (Stroke Patients Only) ?  ? ? ?  ?Balance   ?  ?Sitting balance-Leahy Scale: Fair ?  ?  ?  ?Standing balance-Leahy Scale: Poor ?  ?  ?  ?  ?  ?  ?  ?  ?  ?  ?  ?  ?  ? ?  ?Cognition Arousal/Alertness: Awake/alert ?Behavior During Therapy: [facility] for tasks assessed/performed ?Overall Cognitive Status: History of cognitive impairments - at baseline ?  ?  ?  ?  ?  ?  ?  ?  ?  ?  ?  ?  ?  ?  ?  ?  ?  General Comments: answered questions with yes, no, or okay.  increased time to following directions, cues often required repeating ?  ?  ? ?  ?Exercises General Exercises - Lower Extremity ?Long Arc Quad: AROM, Both, 5 reps (alternating) ?Hip Flexion/Marching: AROM, Both, Seated, 20 reps (good job alternating) ? ?  ?General Comments   ?  ?  ? ?Pertinent Vitals/Pain Pain Assessment ?Pain Assessment: No/denies pain  ? ? ?Home Living   ?  ?  ?  ?  ?  ?  ?  ?  ?  ?   ?  ?Prior Function     ?  ?  ?   ? ?PT Goals (current goals can now be found in the care plan section) Acute Rehab PT Goals ?Patient Stated Goal: return home ?PT Goal Formulation: With patient/family ?Time For Goal Achievement: 09/17/21 ?Potential to Achieve Goals: Fair ?Progress towards PT goals: Progressing toward goals ? ?  ?Frequency ? ? ? Min 3X/week ? ? ? ?  ?PT Plan Discharge plan needs to be updated (Would like to consider Acute Inpt Rehab)  ? ? ?Co-evaluation   ?  ?  ?  ?  ? ?  ?AM-PAC PT "6 Clicks" Mobility   ?Outcome Measure ? Help needed turning from your back to your side while in a flat bed without using bedrails?: A Lot ?Help needed moving from lying on your back to sitting on the side of a flat bed without using bedrails?: A Lot ?Help needed moving to and from a bed to a chair (including a wheelchair)?: A Lot ?Help needed standing up from a chair using your arms (e.g., wheelchair or bedside chair)?: A Lot ?Help needed to walk in hospital room?: Total ?Help needed climbing 3-5 steps with a railing? : Total ?6 Click Score: 10 ? ?  ?End of Session Equipment Utilized During Treatment: Gait belt ?Activity Tolerance: Patient tolerated treatment well ?Patient left: in bed;with call bell/phone within reach;with bed alarm set (with plan to go to [facility]) ?Nurse Communication: Mobility status ?PT Visit Diagnosis: Unsteadiness on feet (R26.81);Other abnormalities of gait and mobility (R26.89);Muscle weakness (generalized) (M62.81) ?  ? ? ?Time: 2500-3704 ?PT Time Calculation (min) (ACUTE ONLY): 26 min ? ?Charges:  $Therapeutic Exercise: 8-22 mins ?$Therapeutic Activity: 8-22 mins          ?          ? ?Roney Marion, PT  ?Acute Rehabilitation Services ?Office [phone] ? ? ? ?Colletta [place] ?09/05/2021, 3:39 PM ? ?

## 2021-09-06 DIAGNOSIS — I10 Essential (primary) hypertension: Secondary | ICD-10-CM | POA: Diagnosis not present

## 2021-09-06 DIAGNOSIS — N179 Acute kidney failure, unspecified: Secondary | ICD-10-CM | POA: Diagnosis not present

## 2021-09-06 DIAGNOSIS — G9340 Encephalopathy, unspecified: Secondary | ICD-10-CM | POA: Diagnosis not present

## 2021-09-06 DIAGNOSIS — R531 Weakness: Secondary | ICD-10-CM | POA: Diagnosis not present

## 2021-09-06 DIAGNOSIS — R1310 Dysphagia, unspecified: Secondary | ICD-10-CM

## 2021-09-06 DIAGNOSIS — E119 Type 2 diabetes mellitus without complications: Secondary | ICD-10-CM | POA: Diagnosis not present

## 2021-09-06 DIAGNOSIS — F79 Unspecified intellectual disabilities: Secondary | ICD-10-CM | POA: Diagnosis not present

## 2021-09-06 DIAGNOSIS — Z515 Encounter for palliative care: Secondary | ICD-10-CM | POA: Diagnosis not present

## 2021-09-06 LAB — BASIC METABOLIC PANEL
Anion gap: 6 (ref 5–15)
BUN: 9 mg/dL (ref 8–23)
CO2: 23 mmol/L (ref 22–32)
Calcium: 8.8 mg/dL — ABNORMAL LOW (ref 8.9–10.3)
Chloride: 107 mmol/L (ref 98–111)
Creatinine, Ser: 1.03 mg/dL (ref 0.61–1.24)
GFR, Estimated: >60 mL/min (ref >60)
Glucose, Bld: 106 mg/dL — ABNORMAL HIGH (ref 70–99)
Potassium: 3.5 mmol/L (ref 3.5–5.1)
Sodium: 136 mmol/L (ref 135–145)

## 2021-09-06 LAB — GLUCOSE, CAPILLARY
Glucose-Capillary: 101 mg/dL — ABNORMAL HIGH (ref 70–99)
Glucose-Capillary: 104 mg/dL — ABNORMAL HIGH (ref 70–99)
Glucose-Capillary: 107 mg/dL — ABNORMAL HIGH (ref 70–99)
Glucose-Capillary: 132 mg/dL — ABNORMAL HIGH (ref 70–99)
Glucose-Capillary: 90 mg/dL (ref 70–99)
Glucose-Capillary: 92 mg/dL (ref 70–99)

## 2021-09-06 NOTE — Progress Notes (Signed)
Inpatient Rehab Admissions Coordinator:  ? ?Met with pt at the bedside to discuss CIR recommendations and goals/expectations of CIR stay.  He is agreeable to rehab stay and agreeable to me calling his legal guardian.  I spoke to [place] on the phone and relayed 3 hrs/day of therapy, goals of supervision to mod I, and average length of stay 2 weeks.  We reviewed Medicare benefits.  She reports that pt lives with her son who provides assist for transportation, meals, and med management.  Pt with intellectual disability at baseline, c/b encephalopathy from infection.  Meeting planned with palliative care team this afternoon.  Will follow.  ? ?Shann Medal, PT, DPT ?Admissions Coordinator ?[phone] ?09/06/21  ?2:24 PM ? ?

## 2021-09-06 NOTE — PMR Pre-admission (Signed)
PMR Admission Coordinator Pre-Admission Assessment ? ?Patient: Daniel Bailey. is an 71 y.o., male ?MRN: [id number] ?DOB: 30-Jan-1951 ?Height:   ?Weight: 84.5 kg ? ?Insurance Information ?HMO:     PPO:      PCP:      IPA:      80/20:      OTHER:  ?PRIMARY: Medicare A/B      Policy#: [id number]      Subscriber: pt ?CM Name:       Phone#:      Fax#:  ?Pre-Cert#: verified online      Employer:  ?Benefits:  Phone #:      Name:  ?Eff. Date: 10/06/88 A/B     Deduct: $1600      Out of Pocket Max: n/a      Life Max: n/a ?CIR: 100%      SNF: 20 full days ?Outpatient: 80%     Co-Ins: 20% ?Home Health: 100%      Co-Pay:  ?DME: 80%     Co-Ins: 20% ?Providers:  ?SECONDARY:       Policy#:      Phone#:  ? ?Financial Counselor:       Phone#:  ? ?The ?Data Collection Information Summary? for patients in Inpatient Rehabilitation Facilities with attached ?Privacy Act [place] Records? was provided and verbally reviewed with: Family ? ?Emergency Contact Information ?Contact Information   ? ? Name Relation Home Work Mobile  ? Daniel Bailey ([place]) Relative [phone]    ? Daniel Bailey [phone]  [phone]  ? ?  ? ? ?Current Medical History  ?Patient Admitting Diagnosis: encephalopathy ? ?History of Present Illness: Daniel, Bailey. 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  Prior GIB, diabetes mellitus, anemia with hematology work-up in the past, hyperlipidemia, recent recurrent aspiration pneumonia admission.   Presented 09/02/2021 with cough over the past few days without fever and reports of lethargy with some bizarre behavior.  CT/MRI of the brain showed no acute process.  Chest x-ray retrocardiac infiltrate as seen on prior comparisons.  Urine culture less than 10,000 insignificant growth, WBC 13,400, hemoglobin 9.7, creatinine 1.48, blood cultures no growth to date, lactic acid 0.8,  ammonia level 18, procalcitonin level 0.28.  Renal ultrasound showed no evidence of hydronephrosis or renal mass.  EEG negative for seizure.  He was placed on broad-spectrum antibiotics with ceftriaxone/azithromycin x5 days completed 09/06/2021 and leukocytosis improved 6.7.  Patient's mental status continues to improve suspect acute encephalopathy multifactorial patient now close to baseline.  He is currently maintained on a dysphagia #1 nectar thick liquid diet.  Palliative care consulted to establish goals of care.  He is maintained on Lovenox for DVT prophylaxis.  Therapy evaluations completed due to patient decreased functional mobility altered mental status was recommended for a comprehensive rehab program. ?  ? ?Patient's medical record from Daniel Bailey has been reviewed by the rehabilitation admission coordinator and physician. ? ?Past Medical History  ?Past Medical History:  ?Diagnosis Date  ? Anemia   ? Elevated PSA   ? Frequent urination   ? Glaucoma   ? History of COVID-19   ? Hyperglycemia   ? Hyperlipidemia   ? Hypertension   ? Sleep apnea   ? ? ?Has the patient had major surgery during 100 days prior to admission? No ? ?Family History   ?family history includes Cancer in his father; Colon cancer in his mother; Kidney disease in his mother. ? ?  Current Medications ? ?Current Facility-Administered Medications:  ?  acetaminophen (TYLENOL) tablet 650 mg, 650 mg, Oral, Q6H PRN **OR** acetaminophen (TYLENOL) suppository 650 mg, 650 mg, Rectal, Q6H PRN, Vance Gather B, MD ?  dextrose 5 % in lactated ringers infusion, , Intravenous, Continuous, Patrecia Pour, MD, Last Rate: 100 mL/hr at 09/07/21 0359, New Bag at 09/07/21 0359 ?  enoxaparin (LOVENOX) injection 40 mg, 40 mg, Subcutaneous, Q24H, Patrecia Pour, MD, 40 mg at 09/06/21 2213 ?  iohexol (OMNIPAQUE) 300 MG/ML solution 50 mL, 50 mL, Oral, Once PRN, Patrecia Pour, MD ?  midodrine (PROAMATINE) tablet 5 mg, 5 mg, Oral, TID WC, Patrecia Pour, MD, 5 mg at  09/07/21 0743 ?  ondansetron (ZOFRAN) tablet 4 mg, 4 mg, Oral, Q6H PRN **OR** ondansetron (ZOFRAN) injection 4 mg, 4 mg, Intravenous, Q6H PRN, Patrecia Pour, MD ?  sodium chloride flush (NS) 0.9 % injection 3 mL, 3 mL, Intravenous, Q12H, Patrecia Pour, MD, 3 mL at 09/04/21 2140 ? ?Patients Current Diet:  ?Diet Order   ? ?       ?  Diet - low sodium heart healthy       ?  ?  DIET - DYS 1 Room service appropriate? Yes; Fluid consistency: Nectar Thick  Diet effective now       ?  ? ?  ?  ? ?  ? ? ?Precautions / Restrictions ?Precautions ?Precautions: Fall ?Restrictions ?Weight Bearing Restrictions: No  ? ?Has the patient had 2 or more falls or a fall with injury in the past year? No ? ?Prior Activity Level ?Limited Community (1-2x/wk): went out for appointments, does not drive, does not use DME, assist for meals/med management, intellectual disability at baseline but can do simple ADLs and household tasks ? ?Prior Functional Level ?Self Care: Did the patient need help bathing, dressing, using the toilet or eating? Independent ? ?Indoor Mobility: Did the patient need assistance with walking from room to room (with or without device)? Independent ? ?Stairs: Did the patient need assistance with internal or external stairs (with or without device)? Independent ? ?Functional Cognition: Did the patient need help planning regular tasks such as shopping or remembering to take medications? Independent ? ?Patient Information ?Are you of Hispanic, Latino/a,or Spanish origin?: A. No, not of Hispanic, Latino/a, or Spanish origin ?What is your race?: B. Black or African American ?Do you need or want an interpreter to communicate with a doctor or health care staff?: 0. No ? ?Patient's Response To:  ?Health Literacy and Transportation ?Is the patient able to respond to health literacy and transportation needs?: No ?Health Literacy - How often do you need to have someone help you when you read instructions, pamphlets, or other written  material from your doctor or pharmacy?: Patient unable to respond ?In the past 12 months, has lack of transportation kept you from medical appointments or from getting medications?: No ?In the past 12 months, has lack of transportation kept you from meetings, work, or from getting things needed for daily living?: No ? ?Home Assistive Devices / Equipment ?Home Equipment: Conservation officer, nature (2 wheels) ? ?Prior Device Use: Indicate devices/aids used by the patient prior to current illness, exacerbation or injury? None of the above ? ?Current Functional Level ?Cognition ? Overall Cognitive Status: History of cognitive impairments - at baseline ?Current Attention Level: Sustained ?Orientation Level: Oriented to person, Disoriented to place, Disoriented to time, Disoriented to situation ?Following Commands: Follows one step commands inconsistently, Follows one  step commands with increased time ?Safety/Judgement: Decreased awareness of safety, Decreased awareness of deficits ?General Comments: able to speak short phrases today and respond to "scoot forward in bed" and tactile cues. Pt pleasant and sweet. ?   ?Extremity Assessment ?(includes Sensation/Coordination) ? Upper Extremity Assessment: Difficult to assess due to impaired cognition  ?Lower Extremity Assessment: Defer to PT evaluation ?RLE Deficits / Details: pt generating good force spontaneously, but poor command following for MMT. impaired coordination ?RLE Coordination: decreased fine motor, decreased gross motor ?LLE Deficits / Details: pt generating good force spontaneously, but poor command following for MMT. impaired coordination ?LLE Coordination: decreased fine motor, decreased gross motor  ?  ?ADLs ? Overall ADL's : Needs assistance/impaired ?Grooming: Wash/dry hands, Wash/dry face, Brushing hair, Minimal assistance, Sitting ?Grooming Details (indicate cue type and reason): min assist with  combing hair ?Lower Body Dressing: Moderate assistance,  Sitting/lateral leans ?Lower Body Dressing Details (indicate cue type and reason): able to doff socks required mod assist to donn ?Toilet Transfer: Rolling walker (2 wheels), Moderate assistance ?Armed forces technical officer Details (ind

## 2021-09-06 NOTE — Progress Notes (Signed)
?Progress Note ? ?Patient: Daniel Bailey. QBH:[id number] DOB: 05-21-1950  ?DOA: 09/02/2021  DOS: 09/06/2021  ?  ?Brief hospital course: ?Jaylin Benzel. is a 71 y.o. male with medical history of intellectual disability, T2DM, HLD, anemia, recent aspiration pneumonia admission who presented with worsening alteration of mental status in the setting of recent cough.  ? ?He was afebrile with leukocytosis, retrocardiac infiltrate on CXR, and mild AKI. EDP reports he had stuttering speech on arrival, had difficulty saying what he seemed to want to say but had nonfocal exam. CT head normal for age. The patient became more agitated ultimately requiring IM geodon. Admission requested for pneumonia complicated by AMS. With treatment, mental status has improved, though SLP, after formal swallow study, feels the patient remains a very high risk for aspiration. Goals of care discussions are ongoing with the patient's guardian, assisted by palliative care consultation.  ? ?Assessment and Plan: ?Acute encephalopathy on baseline intellectual disability: Suspect due to infection, has occurred periodically since November associated with illness. History suggests he's responsive during episodes and has no sustained focal deficits. MRI negative. EEG with nonspecific generalized slowing without seizure/epileptiform discharges. Ammonia, TSH wnl.  ?- Improved nearer to his baseline which is interactive and verbal. He is participating with therapy and in fact, inpatient rehab has been recommended and is consulted for consideration of admission.   ?- Delirium precautions reviewed ?  ?Sinus bradycardia, 1st degree AV block: Developed today without hypotension, though reported some lightheadedness during onset. Not on negative chronotropes.  ?- Cardiology has evaluated the patient, not hypotensive at this time. Avoiding AV nodal agents. No pacemaker planned.  ?- Pursue OSA Tx as outpatient. ? ?LLL pneumonia: suspected to be due to aspiration which  is recurrent. With SLP evaluations, concern for aspiration is quite high.  ?- Will allow a diet with maximal aspiration precautions. Appreciate palliative care consultant for discussion of goals of care with legal guardian. I discussed with [name], NP this afternoon. I did not reach [name] by phone this morning.  ?- Continue typical ceftriaxone, azithromycin, planning 5 days (final dose 5/2) ?- Leukocytosis resolved, PCT sustained improvement. ?- Sputum culture and antigens pending ?- Monitor blood cultures (NG3D) ?- prn O2 to maintain SpO2 >89%. Not currently hypoxemic. ?  ?Normocytic anemia: Has had hematology work up in the past. Anemia panel recently showed no deficiency.  ?- Hgb 9.3 g/dl. Required transfusion last hospitalization. If gross GI bleeding develops, would consult GI, though he has scheduled follow up to investigate +FOBT. ?  ?Controlled NIDT2DM with hypoglycemia:  ?- Monitor q6h, at inpatient goal without hypoglycemia. Hold metformin with history of lactic acidosis.  ?  ?Recurrent hypothermia: Unclear precipitant in setting of improving infection. TSH, cortisol wnl/adequate.  ?- Environmental warming  ?- Monitor closely. ? ?GERD:  ?- Hold PPI to minimize enteral intake/provocation for aspiration. ?  ?HLD:  ?- Hold statin to minimize po intake ?  ?AKI with NAGMA: Improved. Renal U/S indicative of an element of CKD. UA is bland. ?- Continue monitoring, avoid nephrotoxins. ?  ?OSA:  ?- Pt not yet fitted for DME, but did have recent reportedly positive sleep study. Will monitor O2.  ? ?Thrombocytopenia: Mild and stable. ? ?Subjective: He is more alert and interactive than he's been this whole hospital stay today. Hunkered under covers but denies feeling cold. Denies pain. He confirms he is very hungry, however. Hypothermic overnight without additional symptoms to report. ? ?Objective: ?Vitals:  ? 09/06/21 0241 09/06/21 0400 09/06/21 0745 09/06/21  0750  ?BP:  1'11/67 90/60 90/60 '$  ?Pulse:  (!)  58 65 65  ?Resp:  '16 20 20  '$ ?Temp: (!) 93.8 ?F (34.3 ?C) (!) 93.9 ?F (34.4 ?C) (!) 95.5 ?F (35.3 ?C)   ?TempSrc: Rectal Rectal Rectal   ?SpO2:  98% 93% 93%  ?Weight:      ? ?Gen: 71 y.o. male in no distress ?Pulm: Nonlabored breathing room air. Clear. ?CV: Regular with rate in 60's this AM. No murmur, rub, or gallop. No JVD, no dependent edema. ?GI: Abdomen soft, non-tender, non-distended, with normoactive bowel sounds.  ?Ext: Warm, no deformities ?Skin: No rashes, lesions or ulcers on visualized skin. ?Neuro: Alert, verbal without stuttering or tremor, follows commands without focal neurological deficits. ?Psych: Judgement and insight appear impaired.  ? ?Data Personally reviewed: ?CBC: ?Recent Labs  ?Lab 09/02/21 ?1517 09/03/21 ?0252 09/04/21 ?0126  ?WBC 13.4* 9.7 6.7  ?NEUTROABS 12.6*  --   --   ?HGB 9.7* 9.7* 9.3*  ?HCT 29.2* 28.3* 27.3*  ?MCV 92.7 92.2 91.3  ?PLT 176 148* 148*  ? ?Basic Metabolic Panel: ?Recent Labs  ?Lab 09/02/21 ?1517 09/03/21 ?4132 09/04/21 ?0126 09/05/21 ?4401 09/06/21 ?0272  ?NA 136 139 138 139 136  ?K 4.8 4.4 4.4 4.1 3.5  ?CL 105 106 108 108 107  ?CO2 21* '25 22 24 23  '$ ?GLUCOSE 84 75 93 100* 106*  ?BUN '18 17 14 12 9  '$ ?CREATININE 1.48* 1.41* 1.71* 1.48* 1.03  ?CALCIUM 9.7 9.5 9.2 9.1 8.8*  ?MG 1.7  --   --   --   --   ? ?GFR: ?Estimated Creatinine Clearance: 67.7 mL/min (by C-G formula based on SCr of 1.03 mg/dL). ?Liver Function Tests: ?Recent Labs  ?Lab 09/02/21 ?1517  ?AST 29  ?ALT 15  ?ALKPHOS 96  ?BILITOT 0.6  ?PROT 6.9  ?ALBUMIN 3.1*  ? ?No results for input(s): LIPASE, AMYLASE in the last 168 hours. ?Recent Labs  ?Lab 09/02/21 ?2008  ?AMMONIA 18  ? ?Coagulation Profile: ?No results for input(s): INR, PROTIME in the last 168 hours. ?Cardiac Enzymes: ?No results for input(s): CKTOTAL, CKMB, CKMBINDEX, TROPONINI in the last 168 hours. ?BNP (last 3 results) ?No results for input(s): PROBNP in the last 8760 hours. ?HbA1C: ?No results for input(s): HGBA1C in the last 72  hours. ?CBG: ?Recent Labs  ?Lab 09/05/21 ?0745 09/05/21 ?1207 09/06/21 ?0009 09/06/21 ?5366 09/06/21 ?4403  ?GLUCAP 80 99 132* 90 92  ? ?Lipid Profile: ?No results for input(s): CHOL, HDL, LDLCALC, TRIG, CHOLHDL, LDLDIRECT in the last 72 hours. ?Thyroid Function Tests: ?No results for input(s): TSH, T4TOTAL, FREET4, T3FREE, THYROIDAB in the last 72 hours. ? ?Anemia Panel: ?No results for input(s): VITAMINB12, FOLATE, FERRITIN, TIBC, IRON, RETICCTPCT in the last 72 hours. ?Urine analysis: ?   ?Component Value Date/Time  ? [place] YELLOW 09/05/2021 1813  ? APPEARANCEUR CLEAR 09/05/2021 1813  ? LABSPEC 1.012 09/05/2021 1813  ? PHURINE 6.0 09/05/2021 1813  ? GLUCOSEU NEGATIVE 09/05/2021 1813  ? [place] NEGATIVE 09/05/2021 1813  ? [place] NEGATIVE 09/05/2021 1813  ? BILIRUBINUR neg 08/10/2021 1616  ? [place] NEGATIVE 09/05/2021 1813  ? PROTEINUR 30 (A) 09/05/2021 1813  ? UROBILINOGEN 0.2 08/10/2021 1616  ? NITRITE NEGATIVE 09/05/2021 1813  ? LEUKOCYTESUR NEGATIVE 09/05/2021 1813  ? ?Recent Results (from the past 240 hour(s))  ?Urine Culture     Status: Abnormal  ? Collection Time: 09/02/21  1:45 PM  ? Specimen: Urine, Clean Catch  ?Result Value Ref Range Status  ? Specimen Description URINE,  CLEAN CATCH  Final  ? Special Requests NONE  Final  ? Culture (A)  Final  ?  <10,000 COLONIES/mL INSIGNIFICANT GROWTH ?Performed at [facility], [place] [address] [place], [place] [address] ?  ? Report Status 09/04/2021 FINAL  Final  ?Culture, blood (routine x 2)     Status: None (Preliminary result)  ? Collection Time: 09/02/21  3:55 PM  ? Specimen: BLOOD  ?Result Value Ref Range Status  ? Specimen Description BLOOD SITE NOT SPECIFIED  Final  ? Special Requests   Final  ?  BOTTLES DRAWN AEROBIC AND ANAEROBIC Blood Culture adequate volume  ? Culture   Final  ?  NO GROWTH 3 DAYS ?Performed at [facility], [place] [address], [place], [place] [address] ?  ? Report Status PENDING  Incomplete  ?Culture, blood (routine x 2)      Status: None (Preliminary result)  ? Collection Time: 09/02/21  3:55 PM  ? Specimen: BLOOD  ?Result Value Ref Range Status  ? Specimen Description BLOOD SITE NOT SPECIFIED  Final  ? Special Requests   Final  ?  BOTT

## 2021-09-06 NOTE — Progress Notes (Signed)
Made MD aware of patient's temperature of 95.5 rectally. No new orders at this time. Patient wrapped in warm blankets. ?

## 2021-09-06 NOTE — Progress Notes (Signed)
Modified Barium Swallow Progress Note ? ?Patient Details  ?Name: Daniel Bailey. ?MRN: [id number] ?Date of Birth: 1950/12/18 ? ?Today's Date: 09/06/2021 ? ?Modified Barium Swallow completed.  Full report located under Chart Review in the Imaging Section. ? ?Brief recommendations include the following: ? ?Clinical Impression ? Pt demonstrates a moderate to severe oropharyngeal dysphagia with delayed oral transit decreased bolus cohesion, lingual and palatal residue as well as significantly decreased strength of mid/lower pharyngeal constrictors with vallecular and pyriform sinus residue with all textures that spill into the airway post swallow. Pt aspirates purees and solids as well. Pt only senses larger volume aspiration. Attempted to cue pt to clear throat and swallow again. With max cueing he was able to do this, but with low effort and weakness. Perhaps with training pt could improve, but aspiration risk will be high with all textures. Nectar thick and puree with sips of liquids after solids and cueing to clear throat and swallow again are ideal but perhaps not realistic. Will f/u for decision making with team. ?  ?Swallow Evaluation Recommendations ? ?   ? ? SLP Diet Recommendations: Dysphagia 1 (Puree) solids;Nectar thick liquid ? ? Liquid Administration via: Cup;Straw ? ? Medication Administration: Crushed with puree ? ? Supervision: Staff to assist with self feeding ? ? Compensations: Slow rate;Small sips/bites;Follow solids with liquid;Effortful swallow;Clear throat intermittently ? ? Postural Changes: Remain semi-upright after after feeds/meals (Comment) ? ? Oral Care Recommendations: Oral care BID ? ?   ? ? ? ?Florene Brill, Katherene Ponto ?09/06/2021,8:25 AM ?

## 2021-09-06 NOTE — Progress Notes (Signed)
Physical Therapy Treatment ?Patient Details ?Name: Daniel Bailey. ?MRN: [id number] ?DOB: 04-25-1951 ?Today's Date: 09/06/2021 ? ? ?History of Present Illness The pt is a 71 yo male presenting 4/28 with AMS and possible aphasia for about 2 days. Pt suspected to have acute encephalopathy due to infection and LLL PNA. PMH includes: intellectual disability, DM II, HLD, HTN, anemia, and recent aspiration PNA. ? ?  ?PT Comments  ? ? Patient is progressing towards physical therapy goals with skilled PT interventions performed today including gait training. Patient required mod A for bed mobility, transfers, and ambulation using RW UE support. Pt responds best with simple verbal and tactile cues. Pt did require cues for walker proximity, RW management, upright posture, and sequencing steps throughout. Pt with notable knee buckling and LE shakiness during ambulation. Pt seated following successful session of gait training. Orthostatics taken; recorded below. HR stable throughout. Current AIR recommendation still remains appropriate. Pt will benefit from continued skilled PT to maximize pt safety and independence with functional mobility. ? ?PTA, patient was independent with mobility tasks and has experienced a significant functional decline. He was participatory and responded well to PT. Patient very self-motivated to ambulate and mobilize out of bed. Pt would benefit from all 3 rehab disciplines to improve self-care, speech, and mobility. Pt has excellent family support at home; to decrease caregiver burden, PT seeking rehab consultation to AIR. Pt would benefit from intensive rehab and would tolerate longer rehab sessions. ? ?Orthostatics ? ?Supine BP: 100/57 mmHg ?HR: 56 bpm  ?Sitting BP: 94/55 mmHg (67) ?HR: 67 bpm  ?Standing BP:108/43mHg ?HR: 76 bpm  ? ? ?   ?Recommendations for follow up therapy are one component of a multi-disciplinary discharge planning process, led by the attending physician.  Recommendations may be  updated based on patient status, additional functional criteria and insurance authorization. ? ?Follow Up Recommendations ? Acute inpatient rehab (3hours/day) (Guardian prefers HHPT, however, family has not been educated on AIR option for rehab) ?  ?  ?Assistance Recommended at Discharge Frequent or constant Supervision/Assistance  ?Patient can return home with the following Two people to help with walking and/or transfers;Two people to help with bathing/dressing/bathroom;Assistance with cooking/housework;Assistance with feeding;Direct supervision/assist for medications management;Direct supervision/assist for financial management;Assist for transportation;Help with stairs or ramp for entrance ?  ?Equipment Recommendations ? Wheelchair (measurements PT);Wheelchair cushion (measurements PT);BSC/3in1  ?  ?Recommendations for Other Services   ? ? ?  ?Precautions / Restrictions Precautions ?Precautions: Fall ?Restrictions ?Weight Bearing Restrictions: No  ?  ? ?Mobility ? Bed Mobility ?Overal bed mobility: Needs Assistance ?Bed Mobility: Sit to Supine ?  ?  ?Supine to sit: Mod assist ?  ?  ?General bed mobility comments: Mod A with trunk elevation and scooting hips to EOB. Pt requiring simple VC's and difficulty problem solving with more complex verbal cues. ?  ? ?Transfers ?Overall transfer level: Needs assistance ?Equipment used: Rolling walker (2 wheels) ?Transfers: Sit to/from Stand, Bed to chair/wheelchair/BSC ?Sit to Stand: Mod assist ?  ?Step pivot transfers: Mod assist ?  ?  ?  ?General transfer comment: Mod A for lift assist and stabilizing during transfers. Pt requiring simple verbal cues with bilateral knee buckling. Used RW for UE support, however, pt needed cues for hand placement and RW management since he has never used this AD before. ?  ? ?Ambulation/Gait ?Ambulation/Gait assistance: Mod assist ?Gait Distance (Feet): 30 Feet ?Assistive device: Rolling walker (2 wheels) ?Gait Pattern/deviations: Step-to  pattern, Narrow base of support, Trunk flexed, Knee  flexed in stance - right, Knee flexed in stance - left, Decreased weight shift to right, Decreased weight shift to left, Decreased stride length ?Gait velocity: decreased ?Gait velocity interpretation: <1.31 ft/sec, indicative of household ambulator ?  ?General Gait Details: required mod A for stabilizing while pt used RW for UE support. Pt required cues for RW proximity and upright posture. SPT standing behind pt facilitating upright posture with close chair follow behind. Pt difficulty sequencing steps initially and slowly improved. Pt knees buckling throughout with increased buckling at end of short bout gait. Pt smiling throughout. VSS during activity with HR responding appropriately throughout (see general comments). ? ? ?Stairs ?  ?  ?  ?  ?  ? ? ?Wheelchair Mobility ?  ? ?Modified Rankin (Stroke Patients Only) ?  ? ? ?  ?Balance Overall balance assessment: Needs assistance ?Sitting-balance support: Single extremity supported, Feet supported ?Sitting balance-Leahy Scale: Fair ?Sitting balance - Comments: min guard A for steadying in sitting. ?  ?Standing balance support: Bilateral upper extremity supported, During functional activity, Reliant on assistive device for balance ?Standing balance-Leahy Scale: Poor ?Standing balance comment: reliant on RW and external mod A for balance ?  ?  ?  ?  ?  ?  ?  ?  ?  ?  ?  ?  ? ?  ?Cognition Arousal/Alertness: Awake/alert ?Behavior During Therapy: [facility] for tasks assessed/performed ?Overall Cognitive Status: History of cognitive impairments - at baseline ?Area of Impairment: Attention, Following commands, Safety/judgement, Awareness, Problem solving ?  ?  ?  ?  ?  ?  ?  ?  ?  ?Current Attention Level: Sustained ?  ?Following Commands: Follows one step commands inconsistently, Follows one step commands with increased time ?Safety/Judgement: Decreased awareness of safety, Decreased awareness of deficits ?Awareness:  Intellectual ?Problem Solving: Slow processing, Requires verbal cues, Requires tactile cues, Difficulty sequencing ?General Comments: able to speak short phrases today and respond to "scoot forward in bed" and tactile cues. Pt pleasant and sweet. ?  ?  ? ?  ?Exercises   ? ?  ?General Comments General comments (skin integrity, edema, etc.): Supine: BP 100/49mHg and HR 56bpm; Sitting: BP 94/542mg and HR 67bpm; Standing: BP 108/6430m and HR 76bpm ?  ?  ? ?Pertinent Vitals/Pain Pain Assessment ?Pain Assessment: No/denies pain  ? ? ?Home Living Family/patient expects to be discharged to:: Private residence ?  ?  ?  ?  ?  ?  ?  ?  ?  ?   ?  ?Prior Function    ?  ?  ?   ? ?PT Goals (current goals can now be found in the care plan section) Acute Rehab PT Goals ?Patient Stated Goal: return home ?PT Goal Formulation: With patient ?Time For Goal Achievement: 09/17/21 ?Potential to Achieve Goals: Fair ?Progress towards PT goals: Progressing toward goals ? ?  ?Frequency ? ? ? Min 3X/week ? ? ? ?  ?PT Plan Current plan remains appropriate  ? ? ?Co-evaluation   ?  ?  ?  ?  ? ?  ?AM-PAC PT "6 Clicks" Mobility   ?Outcome Measure ? Help needed turning from your back to your side while in a flat bed without using bedrails?: A Little ?Help needed moving from lying on your back to sitting on the side of a flat bed without using bedrails?: A Lot ?Help needed moving to and from a bed to a chair (including a wheelchair)?: A Lot ?Help needed standing up from a chair using  your arms (e.g., wheelchair or bedside chair)?: A Lot ?Help needed to walk in hospital room?: A Lot ?Help needed climbing 3-5 steps with a railing? : Total ?6 Click Score: 12 ? ?  ?End of Session Equipment Utilized During Treatment: Gait belt ?Activity Tolerance: Patient tolerated treatment well;Patient limited by fatigue ?Patient left: in chair;with chair alarm set;with call bell/phone within reach ?Nurse Communication: Mobility status ?PT Visit Diagnosis: Unsteadiness  on feet (R26.81);Other abnormalities of gait and mobility (R26.89);Muscle weakness (generalized) (M62.81) ?  ? ? ?Time: 9563-8756 ?PT Time Calculation (min) (ACUTE ONLY): 37 min ? ?Charges:  $Gait Training: 8-2

## 2021-09-06 NOTE — Care Management Important Message (Signed)
Important Message ? ?Patient Details  ?Name: Daniel Bailey. ?MRN: [id number] ?Date of Birth: 1951-03-31 ? ? ?Medicare Important Message Given:  Yes ? ? ? ? ?[name] ?09/06/2021, 12:42 PM ?

## 2021-09-06 NOTE — Consult Note (Signed)
? ?                                                                                ?Consultation Note ?Date: 09/06/2021  ? ?Patient Name: Daniel Bailey.  ?DOB: [birth year]  MRN: [id number]  Age / Sex: 71 y.o., male  ?PCP: [name], MD ?Referring Physician: Patrecia Pour, MD ? ?Reason for Consultation: Establishing goals of care and Psychosocial/spiritual support ? ?HPI/Patient Profile: 71 y.o. male   admitted on 09/02/2021 with past medical history  ?intellectual disability, sinus bradycardia, bifascicular block, prior GIB, prior hospitalization for aspiration PNA, OSA (not yet fitted for CPAP), aortic atherosclerosis, hypertension, anemia, hyperlipidemia and diabetes. Presented 4/28 with LLL PNA suspected due to aspiration and and superimposed encephalopathy on baseline intellectual disability. Cardiology asked to see for bradycardia, which was also noted during recent office visit 4/6. ? ?Patient currently being assessed for inpatient rehab/CIR ? ?Family face treatment options decisions, advanced directive decisions and anticipatory care needs.  ? ? ?Clinical Assessment and Goals of Care: ? ?This NP [name] reviewed medical records, received report from team, assessed the patient and then meet at the patient's bedside legal guardians/ Daniel Bailey and Daniel Bailey  to discuss diagnosis, prognosis, [place], EOL wishes disposition and options. ?  ?Concept of Palliative Care was introduced as specialized medical care for people and their families living with serious illness.  If focuses on providing relief from the symptoms and stress of a serious illness.  The goal is to improve quality of life for both the patient and the family. Values and goals of care important to patient and family were attempted to be elicited. ? ?Created space and opportunity for family to explore thoughts and feelings regarding current medical situation.  Daniel Bailey describes Daniel Bailey is living "mostly independently", with the assistance  of her son in the home.  There is concern voiced  for increasing personal care needs in the home. ?  ?  ?A  discussion was had today regarding advanced directives.  Concepts specific to code status, artifical feeding and hydration, continued IV antibiotics and rehospitalization was had. ? ?Education offered on the risk and benefits of artifical feeding/PEG tube.  Patient is high risk for aspiration ?Caregivers verbalize that they do not think that a feeding tube would be in Daniel Bailey's best interest. ? ?The difference between a aggressive medical intervention path  and a palliative comfort care path for this patient at this time was had.    ? ? ?MOST form introduced and Hard Choices booklet left for review.  ?  ? Questions and concerns addressed.  Patient  encouraged to call with questions or concerns.   ?  ?PMT will continue to support holistically. ?  ?  ?  ?  ? ? ? Legal guardian/[name] [name]--- document placed in hard chart ? ? ?  ? ?SUMMARY OF RECOMMENDATIONS   ? ?Code Status/Advance Care Planning: ?DNR ? ? ?Symptom Management:  ?Dysphagia: Family aware of aspiration risk..   They are hopeful that ongoing speech therapy will reduce the risk and for now they wish to proceed with recommended diet with aspiration precautions placed ? ?Palliative Prophylaxis:  ?Aspiration,  Delirium Protocol, Frequent Pain Assessment, and Oral Care ? ?Additional Recommendations (Limitations, Scope, Preferences): ?Avoid Hospitalization ? ?Psycho-social/Spiritual:  ?Desire for further Chaplaincy support:no ? ?Prognosis:  ?Unable to determine ? ?Discharge Planning: To Be Determined  ? ?  ? ?Primary Diagnoses: ?Present on Admission: ? Acute encephalopathy ? HYPERTENSION, BENIGN ESSENTIAL ? Mixed hyperlipidemia due to type 2 diabetes mellitus ([place]) ? Normocytic anemia ? GERD (gastroesophageal reflux disease) ? Normal anion gap metabolic acidosis ? AKI (acute kidney injury) ([place]) ? Hypoalbuminemia ? OSA (obstructive sleep apnea) ? ? ?I have  reviewed the medical record, interviewed the patient and family, and examined the patient. The following aspects are pertinent. ? ?Past Medical History:  ?Diagnosis Date  ? Anemia   ? Elevated PSA   ? Frequent urination   ? Glaucoma   ? History of COVID-19   ? Hyperglycemia   ? Hyperlipidemia   ? Hypertension   ? Sleep apnea   ? ?Social History  ? ?Socioeconomic History  ? Marital status: Married  ?  Spouse name: Not on file  ? Number of children: Not on file  ? Years of education: Not on file  ? Highest education level: Not on file  ?Occupational History  ? Not on file  ?Tobacco Use  ? Smoking status: Never  ? Smokeless tobacco: Never  ?Vaping Use  ? Vaping Use: Never used  ?Substance and Sexual Activity  ? Alcohol use: No  ?  Alcohol/week: 0.0 standard drinks  ? Drug use: No  ? Sexual activity: Not Currently  ?Other Topics Concern  ? Not on file  ?Social History Narrative  ? Not on file  ? ?Social Determinants of Health  ? ?Financial Resource Strain: Low Risk   ? Difficulty of Paying Living Expenses: Not hard at all  ?Food Insecurity: No Food Insecurity  ? Worried About Charity fundraiser in the Last Year: Never true  ? Ran Out of Food in the Last Year: Never true  ?Transportation Needs: No Transportation Needs  ? Lack of Transportation (Medical): No  ? Lack of Transportation (Non-Medical): No  ?Physical Activity: Inactive  ? Days of Exercise per Week: 0 days  ? Minutes of Exercise per Session: 0 min  ?Stress: No Stress Concern Present  ? Feeling of Stress : Not at all  ?Social Connections: Not on file  ? ?Family History  ?Problem Relation Age of Onset  ? Colon cancer Mother   ? Kidney disease Mother   ? Cancer Father   ?     unknown  ? ?Scheduled Meds: ? enoxaparin (LOVENOX) injection  40 mg Subcutaneous Q24H  ? midodrine  5 mg Oral TID WC  ? sodium chloride flush  3 mL Intravenous Q12H  ? ?Continuous Infusions: ? azithromycin 500 mg (09/05/21 1828)  ? cefTRIAXone (ROCEPHIN)  IV 1 g (09/05/21 1724)  ? dextrose  5% lactated ringers 100 mL/hr at 09/06/21 0740  ? ?PRN Meds:.acetaminophen **OR** acetaminophen, iohexol, ondansetron **OR** ondansetron (ZOFRAN) IV ?Medications Prior to Admission:  ?Prior to Admission medications   ?Medication Sig Start Date End Date Taking? Authorizing Provider  ?acetaminophen (TYLENOL) 500 MG tablet Take 1,000 mg by mouth daily as needed for headache.   Yes [provider]  ?atorvastatin (LIPITOR) 10 MG tablet Take 1 tablet (10 mg total) by mouth daily. In the evening 08/11/20  Yes Tower, Wynelle Fanny, MD  ?gemfibrozil (LOPID) 600 MG tablet Take 1 tablet (600 mg total) by mouth 2 (two) times daily. 08/11/20  Yes  Tower, Wynelle Fanny, MD  ?metFORMIN (GLUCOPHAGE-XR) 500 MG 24 hr tablet Take 2 tablets (1,000 mg total) by mouth daily with breakfast. 08/11/20  Yes Tower, Wynelle Fanny, MD  ?midodrine (PROAMATINE) 5 MG tablet Take 1 tablet (5 mg total) by mouth 3 (three) times daily with meals. 08/29/21  Yes Tower, Wynelle Fanny, MD  ?pantoprazole (PROTONIX) 40 MG tablet Take 1 tablet (40 mg total) by mouth daily. 07/19/21  Yes Hongalgi, Lenis Dickinson, MD  ? ?No Known Allergies ?Review of Systems  ?All other systems reviewed and are negative. ? ?Physical Exam ?Cardiovascular:  ?   Rate and Rhythm: Normal rate.  ?Pulmonary:  ?   Effort: Pulmonary effort is normal.  ?Skin: ?   General: Skin is warm and dry.  ?Neurological:  ?   Mental Status: He is alert.  ? ? ?Vital Signs: BP 90/60   Pulse 65   Temp (!) 95.5 ?F (35.3 ?C) (Rectal) Comment: MD notified and aware  Resp 20   Wt 84.5 kg   SpO2 93%   BMI 29.62 kg/m?  ?Pain Scale: 0-10 ?  ?Pain Score: 0-No pain ? ? ?SpO2: SpO2: 93 % ?O2 Device:SpO2: 93 % ?O2 Flow Rate: .  ? ?IO: Intake/output summary:  ?Intake/Output Summary (Last 24 hours) at 09/06/2021 1152 ?Last data filed at 09/06/2021 0600 ?Gross per 24 hour  ?Intake 2237.86 ml  ?Output 350 ml  ?Net 1887.86 ml  ? ? ?LBM: Last BM Date :  (pt. unable to recall) ?Baseline Weight: Weight: 84.4 kg ?Most recent weight: Weight: 84.5 kg      ?Palliative Assessment/Data: 40 % ? ? ?Discussed with Dr  Auburn Bilberry ? ? ?Signed by: ?[name], NP ?  ?Please contact Palliative Medicine Team phone at [phone] for questions and concerns.  ?For individua

## 2021-09-06 NOTE — Progress Notes (Signed)
Speech Language Pathology Treatment: Dysphagia  ?Patient Details ?Name: Daniel Bailey. ?MRN: [id number] ?DOB: 10-18-1950 ?Today's Date: 09/06/2021 ?Time: 1000-1020 ?SLP Time Calculation (min) (ACUTE ONLY): 20 min ? ?Assessment / Plan / Recommendation ?Clinical Impression ? Pt wrapped up, eager for a snack. SLP able to given verbal cues to decrease bolus size, clear throat intermittently with successful response from pt which did seem to decrease signs of asrpiation with nectar and puree texture. SLP fed pt to slow rate and follow solids with liquids. Pt had two instances of significant coughing and intermittent wet vocal quality. Aspiration risk is high with all PO intake. Recommend initiating a puree diet with nectar thick liquids with known risk if family is willing.   ?HPI HPI: Daniel Bailey. is a 71 y.o. male admitted with pna and resulting MS. CT abdomen  in March shows Left lower lobe pneumonia, suspect underlying aspiration given  fluid distended esophagus and left lower lobe airway opacification. Pt was seen by SLP in March 2023 and given reflux precautions given similar finding. Pt with medical history of intellectual disability, T2DM, HLD, anemia, recent aspiration pneumonia admission who presented with worsening alteration of mental status in the setting of recent cough. History given by guardian, pt's cousin, as pt is somnolent from receiving IM geodon earlier for agitation. He developed a cough a few days prior but had no fever or evidence of dyspnea. Then yesterday the guardian's son, with whom he lives, reported that the patient, who is normally quite verbose was not speaking very much, then was unable to even get words out to answer questions, was at times answering questions incorrectly. ?  ?   ?SLP Plan ? Continue with current plan of care ? ?  ?  ?Recommendations for follow up therapy are one component of a multi-disciplinary discharge planning process, led by the attending physician.  Recommendations  may be updated based on patient status, additional functional criteria and insurance authorization. ?  ? ?Recommendations  ?Diet recommendations: Nectar-thick liquid;Dysphagia 1 (puree) ?Liquids provided via: Cup;Straw ?Medication Administration: Crushed with puree ?Supervision: Staff to assist with self feeding ?Compensations: Slow rate;Small sips/bites;Clear throat intermittently;Follow solids with liquid ?Postural Changes and/or Swallow Maneuvers: Seated upright 90 degrees  ?   ?    ?   ? ? ? ? Follow Up Recommendations: Home health SLP ?Assistance recommended at discharge: Frequent or constant Supervision/Assistance ?SLP Visit Diagnosis: Dysphagia, oropharyngeal phase (R13.12) ?Plan: Continue with current plan of care ? ? ? ? ?  ?  ? ? ?Burleigh Brockmann, Katherene Ponto ? ?09/06/2021, 10:38 AM ?

## 2021-09-06 NOTE — Progress Notes (Addendum)
? ?Progress Note ? ?Patient Name: Daniel Bailey. ?Date of Encounter: 09/06/2021 ? ?Primary Cardiologist: Early Osmond, MD ? ?Subjective  ? ?Patient denies any CP, SOB. He is noted to be hypothermic overnight with soft BP 90/60. Denies dizziness. HR 50s-60s. ? ?Inpatient Medications  ?  ?Scheduled Meds: ? enoxaparin (LOVENOX) injection  40 mg Subcutaneous Q24H  ? midodrine  5 mg Oral TID WC  ? sodium chloride flush  3 mL Intravenous Q12H  ? ?Continuous Infusions: ? azithromycin 500 mg (09/05/21 1828)  ? cefTRIAXone (ROCEPHIN)  IV 1 g (09/05/21 1724)  ? dextrose 5% lactated ringers 100 mL/hr at 09/06/21 0740  ? ?PRN Meds: ?acetaminophen **OR** acetaminophen, iohexol, ondansetron **OR** ondansetron (ZOFRAN) IV  ? ?Vital Signs  ?  ?Vitals:  ? 09/06/21 0241 09/06/21 0400 09/06/21 0745 09/06/21 0750  ?BP:  1'11/67 90/60 90/60 '$  ?Pulse:  (!) 58 65 65  ?Resp:  '16 20 20  '$ ?Temp: (!) 93.8 ?F (34.3 ?C) (!) 93.9 ?F (34.4 ?C) (!) 95.5 ?F (35.3 ?C)   ?TempSrc: Rectal Rectal Rectal   ?SpO2:  98% 93% 93%  ?Weight:      ? ? ?Intake/Output Summary (Last 24 hours) at 09/06/2021 0762 ?Last data filed at 09/06/2021 0600 ?Gross per 24 hour  ?Intake 3862.23 ml  ?Output 350 ml  ?Net 3512.23 ml  ? ? ?  09/05/2021  ?  5:29 AM 09/03/2021  ?  9:04 PM 09/03/2021  ?  3:57 AM  ?Last 3 Weights  ?Weight (lbs) 186 lb 4.6 oz 188 lb 7.9 oz 186 lb 1.1 oz  ?Weight (kg) 84.5 kg 85.5 kg 84.4 kg  ?  ? ?Telemetry  ?  ?SB 50s-60s presently, occasional PVC - Personally Reviewed ? ?ECG  ?  ?SB 38bpm NSVICD yesterday - Personally Reviewed ? ?Physical Exam  ? ?GEN: No acute distress.  ?HEENT: Normocephalic, atraumatic, sclera non-icteric. ?Neck: No JVD or bruits. ?Cardiac: RRR no murmurs, rubs, or gallops.  ?Respiratory: Coarse, sporadic rhonchi, no wheezing or rales. Breathing is unlabored. ?GI: Soft, nontender, non-distended, BS +x 4. ?MS: no deformity. ?Extremities: No clubbing or cyanosis. No edema. Distal pedal pulses are 2+ and equal bilaterally. ?Neuro:  A+O to  self only, does not know date or place. Follows commands. ?Psych:  Responds to questions appropriately with a normal affect. ? ?Labs  ?  ?High Sensitivity Troponin:  No results for input(s): TROPONINIHS in the last 720 hours.   ? ?Cardiac EnzymesNo results for input(s): TROPONINI in the last 168 hours. No results for input(s): TROPIPOC in the last 168 hours.  ? ?Chemistry ?Recent Labs  ?Lab 09/02/21 ?1517 09/03/21 ?2633 09/04/21 ?0126 09/05/21 ?3545 09/06/21 ?6256  ?NA 136   < > 138 139 136  ?K 4.8   < > 4.4 4.1 3.5  ?CL 105   < > 108 108 107  ?CO2 21*   < > '22 24 23  '$ ?GLUCOSE 84   < > 93 100* 106*  ?BUN 18   < > '14 12 9  '$ ?CREATININE 1.48*   < > 1.71* 1.48* 1.03  ?CALCIUM 9.7   < > 9.2 9.1 8.8*  ?PROT 6.9  --   --   --   --   ?ALBUMIN 3.1*  --   --   --   --   ?AST 29  --   --   --   --   ?ALT 15  --   --   --   --   ?ALKPHOS  96  --   --   --   --   ?BILITOT 0.6  --   --   --   --   ?GFRNONAA 50*   < > 42* 50* >60  ?ANIONGAP 10   < > '8 7 6  '$ ? < > = values in this interval not displayed.  ?  ? ?Hematology ?Recent Labs  ?Lab 09/02/21 ?1517 09/03/21 ?0252 09/04/21 ?0126  ?WBC 13.4* 9.7 6.7  ?RBC 3.15* 3.07* 2.99*  ?HGB 9.7* 9.7* 9.3*  ?HCT 29.2* 28.3* 27.3*  ?MCV 92.7 92.2 91.3  ?MCH 30.8 31.6 31.1  ?MCHC 33.2 34.3 34.1  ?RDW 16.4* 16.6* 16.7*  ?PLT 176 148* 148*  ? ? ?BNPNo results for input(s): BNP, PROBNP in the last 168 hours.  ? ?DDimer No results for input(s): DDIMER in the last 168 hours.  ? ?Radiology  ?  ?US RENAL ? ?Result Date: 09/04/2021 ?CLINICAL DATA:  71 year old male with acute kidney injury. EXAM: RENAL / URINARY TRACT ULTRASOUND COMPLETE COMPARISON:  07/12/2021 CT FINDINGS: Right Kidney: Renal measurements: 10.9 x 4.4 x 5 cm = volume: 125 mL. Slightly increased renal echogenicity is identified. There is no evidence of hydronephrosis, renal mass or definite renal calculi. Left Kidney: Renal measurements: 11.3 x 5 x 5 cm = volume: 154 mL. Slightly increased renal echogenicity noted. There is no evidence  of hydronephrosis, renal mass or definite renal calculi. Bladder: Appears normal for degree of bladder distention. Other: None. IMPRESSION: 1. Slightly increased bilateral renal echogenicity, compatible with medical renal disease. No evidence of hydronephrosis or renal mass. Electronically Signed   By: Margarette Canada M.D.   On: 09/04/2021 14:02   ? ?Cardiac Studies  ? ?2d echo 08/26/21 ? ? 1. Left ventricular ejection fraction, by estimation, is 60 to 65%. Left  ?ventricular ejection fraction by 3D volume is 64 %. The left ventricle has  ?normal function. The left ventricle has no regional wall motion  ?abnormalities. There is mild left  ?ventricular hypertrophy. Left ventricular diastolic parameters are  ?consistent with Grade I diastolic dysfunction (impaired relaxation).  ? 2. Right ventricular systolic function is normal. The right ventricular  ?size is normal. There is normal pulmonary artery systolic pressure. The  ?estimated right ventricular systolic pressure is 40.9 mmHg.  ? 3. Right atrial size was mildly dilated.  ? 4. The pericardial effusion is posterior to the left ventricle.  ? 5. The mitral valve is abnormal. Trivial mitral valve regurgitation.  ? 6. The aortic valve is tricuspid. Aortic valve regurgitation is trivial.  ?Aortic valve sclerosis is present, with no evidence of aortic valve  ?stenosis. Aortic regurgitation PHT measures 506 msec.  ? 7. The inferior vena cava is normal in size with greater than 50%  ?respiratory variability, suggesting right atrial pressure of 3 mmHg.  ? ?Patient Profile  ?   ?71 y.o. male with h/o baseline intellectual disability, sinus bradycardia, bifascicular block, prior GIB, prior hospitalization for aspiration PNA, OSA (not yet fitted for CPAP), aortic atherosclerosis, hypertension, anemia, hyperlipidemia and diabetes. Presented 4/28 with LLL PNA suspected due to aspiration and and superimposed encephalopathy on baseline intellectual disability. Cardiology asked to see  for bradycardia, which was also noted during recent office visit 4/6. ? ?Assessment & Plan  ?  ?1. Baseline sinus bradycardia ?- noted prior to admission as well, prompting OV 08/2021 in which he was asymptomatic and conservative approach employed ?- continues to have bradycardia in the 30s-50s intermittently, no advanced heart block seen ?-  K wnl, TSH wnl ?- given asymptomatic nature, PPM not advised at this time - also felt to be infection risk due to recurrent aspiration PNA ?- 2D echo 08/26/21 EF 60-65%, mild LVH, g1DD, normal RV, aortic sclerosis without stenosis, trivial pericardial effusion ?- avoid AVN blocking agents ?- recommend continue working on getting CPAP ?- HR trends 30s-low 100s on monitor so is able to mount HR variation ?- HR 50s-60s presently so do not suspect this is contributing to hypotension ? ?2. Aortic atherosclerosis ?- IM holding statin to minimize PO intake presently ? ?3. Hypotension ?- continue midodrine -> he is hypotensive this morning. HR 50s-60s presently so do not suspect this is contributing to hypotension. I am concerned his BP was normal yesterday and now trending downward along with hypothermia - recommend further eval by medicine team, sent msg to IM attending as FYI ? ?Remainder of medical issues per IM: ?- acute encephalopathy superimposed on baseline intellectual disability ?- LLL PNA ?- normocytic anemia, required txf last admission ?- AKI, improved ? ? ?For questions or updates, please contact [place] ?Please consult [email or web address] for contact info under Cardiology/STEMI. ? ?Signed, ?Charlie Pitter, PA-C ?09/06/2021, 8:33 AM   ? ?

## 2021-09-07 ENCOUNTER — Other Ambulatory Visit: Payer: Self-pay

## 2021-09-07 ENCOUNTER — Inpatient Hospital Stay (HOSPITAL_COMMUNITY)
Admission: RE | Admit: 2021-09-07 | Discharge: 2021-09-26 | DRG: 945 | Disposition: A | Discharge status: Skilled Nursing Facility | Payer: Medicare Other | Source: Intra-hospital | Attending: Physical Medicine & Rehabilitation | Admitting: Physical Medicine & Rehabilitation

## 2021-09-07 ENCOUNTER — Encounter (HOSPITAL_COMMUNITY): Payer: Self-pay | Admitting: Physical Medicine & Rehabilitation

## 2021-09-07 DIAGNOSIS — G9340 Encephalopathy, unspecified: Secondary | ICD-10-CM | POA: Diagnosis present

## 2021-09-07 DIAGNOSIS — Z7984 Long term (current) use of oral hypoglycemic drugs: Secondary | ICD-10-CM

## 2021-09-07 DIAGNOSIS — F79 Unspecified intellectual disabilities: Secondary | ICD-10-CM | POA: Diagnosis present

## 2021-09-07 DIAGNOSIS — Z66 Do not resuscitate: Secondary | ICD-10-CM | POA: Diagnosis present

## 2021-09-07 DIAGNOSIS — R2689 Other abnormalities of gait and mobility: Secondary | ICD-10-CM | POA: Diagnosis not present

## 2021-09-07 DIAGNOSIS — I959 Hypotension, unspecified: Secondary | ICD-10-CM | POA: Diagnosis present

## 2021-09-07 DIAGNOSIS — E119 Type 2 diabetes mellitus without complications: Secondary | ICD-10-CM | POA: Diagnosis not present

## 2021-09-07 DIAGNOSIS — G4733 Obstructive sleep apnea (adult) (pediatric): Secondary | ICD-10-CM | POA: Diagnosis present

## 2021-09-07 DIAGNOSIS — R1310 Dysphagia, unspecified: Secondary | ICD-10-CM | POA: Diagnosis present

## 2021-09-07 DIAGNOSIS — E1169 Type 2 diabetes mellitus with other specified complication: Secondary | ICD-10-CM | POA: Diagnosis not present

## 2021-09-07 DIAGNOSIS — N179 Acute kidney failure, unspecified: Secondary | ICD-10-CM | POA: Diagnosis not present

## 2021-09-07 DIAGNOSIS — H409 Unspecified glaucoma: Secondary | ICD-10-CM | POA: Diagnosis present

## 2021-09-07 DIAGNOSIS — R059 Cough, unspecified: Secondary | ICD-10-CM | POA: Diagnosis not present

## 2021-09-07 DIAGNOSIS — R531 Weakness: Secondary | ICD-10-CM | POA: Diagnosis not present

## 2021-09-07 DIAGNOSIS — E785 Hyperlipidemia, unspecified: Secondary | ICD-10-CM | POA: Diagnosis present

## 2021-09-07 DIAGNOSIS — Z8616 Personal history of COVID-19: Secondary | ICD-10-CM | POA: Diagnosis not present

## 2021-09-07 DIAGNOSIS — I129 Hypertensive chronic kidney disease with stage 1 through stage 4 chronic kidney disease, or unspecified chronic kidney disease: Secondary | ICD-10-CM | POA: Diagnosis present

## 2021-09-07 DIAGNOSIS — Z8701 Personal history of pneumonia (recurrent): Secondary | ICD-10-CM | POA: Diagnosis not present

## 2021-09-07 DIAGNOSIS — R278 Other lack of coordination: Secondary | ICD-10-CM | POA: Diagnosis not present

## 2021-09-07 DIAGNOSIS — Z79899 Other long term (current) drug therapy: Secondary | ICD-10-CM

## 2021-09-07 DIAGNOSIS — K219 Gastro-esophageal reflux disease without esophagitis: Secondary | ICD-10-CM | POA: Diagnosis present

## 2021-09-07 DIAGNOSIS — E11649 Type 2 diabetes mellitus with hypoglycemia without coma: Secondary | ICD-10-CM | POA: Diagnosis not present

## 2021-09-07 DIAGNOSIS — D631 Anemia in chronic kidney disease: Secondary | ICD-10-CM | POA: Diagnosis present

## 2021-09-07 DIAGNOSIS — I452 Bifascicular block: Secondary | ICD-10-CM | POA: Diagnosis present

## 2021-09-07 DIAGNOSIS — D649 Anemia, unspecified: Secondary | ICD-10-CM | POA: Diagnosis not present

## 2021-09-07 DIAGNOSIS — R1312 Dysphagia, oropharyngeal phase: Secondary | ICD-10-CM | POA: Diagnosis not present

## 2021-09-07 DIAGNOSIS — N1831 Chronic kidney disease, stage 3a: Secondary | ICD-10-CM | POA: Diagnosis present

## 2021-09-07 DIAGNOSIS — I10 Essential (primary) hypertension: Secondary | ICD-10-CM | POA: Diagnosis not present

## 2021-09-07 DIAGNOSIS — Z515 Encounter for palliative care: Secondary | ICD-10-CM | POA: Diagnosis not present

## 2021-09-07 DIAGNOSIS — E1122 Type 2 diabetes mellitus with diabetic chronic kidney disease: Secondary | ICD-10-CM | POA: Diagnosis present

## 2021-09-07 DIAGNOSIS — R4182 Altered mental status, unspecified: Secondary | ICD-10-CM | POA: Diagnosis present

## 2021-09-07 DIAGNOSIS — R5381 Other malaise: Secondary | ICD-10-CM | POA: Diagnosis present

## 2021-09-07 DIAGNOSIS — R001 Bradycardia, unspecified: Secondary | ICD-10-CM | POA: Diagnosis present

## 2021-09-07 DIAGNOSIS — M6281 Muscle weakness (generalized): Secondary | ICD-10-CM | POA: Diagnosis not present

## 2021-09-07 LAB — CULTURE, BLOOD (ROUTINE X 2)
Culture: NO GROWTH
Culture: NO GROWTH
Special Requests: ADEQUATE

## 2021-09-07 LAB — BASIC METABOLIC PANEL
Anion gap: 8 (ref 5–15)
BUN: 13 mg/dL (ref 8–23)
CO2: 24 mmol/L (ref 22–32)
Calcium: 8.8 mg/dL — ABNORMAL LOW (ref 8.9–10.3)
Chloride: 109 mmol/L (ref 98–111)
Creatinine, Ser: 1.31 mg/dL — ABNORMAL HIGH (ref 0.61–1.24)
GFR, Estimated: 58 mL/min — ABNORMAL LOW (ref >60)
Glucose, Bld: 112 mg/dL — ABNORMAL HIGH (ref 70–99)
Potassium: 4.1 mmol/L (ref 3.5–5.1)
Sodium: 141 mmol/L (ref 135–145)

## 2021-09-07 LAB — CBC
HCT: 27 % — ABNORMAL LOW (ref 39.0–52.0)
Hemoglobin: 9 g/dL — ABNORMAL LOW (ref 13.0–17.0)
MCH: 31.1 pg (ref 26.0–34.0)
MCHC: 33.3 g/dL (ref 30.0–36.0)
MCV: 93.4 fL (ref 80.0–100.0)
Platelets: 162 10*3/uL (ref 150–400)
RBC: 2.89 MIL/uL — ABNORMAL LOW (ref 4.22–5.81)
RDW: 16.7 % — ABNORMAL HIGH (ref 11.5–15.5)
WBC: 4.4 10*3/uL (ref 4.0–10.5)
nRBC: 0 % (ref 0.0–0.2)

## 2021-09-07 LAB — GLUCOSE, CAPILLARY
Glucose-Capillary: 110 mg/dL — ABNORMAL HIGH (ref 70–99)
Glucose-Capillary: 100 mg/dL — ABNORMAL HIGH (ref 70–99)
Glucose-Capillary: 112 mg/dL — ABNORMAL HIGH (ref 70–99)
Glucose-Capillary: 99 mg/dL (ref 70–99)

## 2021-09-07 MED ORDER — ENOXAPARIN SODIUM 40 MG/0.4ML IJ SOSY: 40.0000 mg | PREFILLED_SYRINGE | INTRAMUSCULAR | Status: DC

## 2021-09-07 MED ORDER — INSULIN ASPART 100 UNIT/ML IJ SOLN: 0.0000 [IU] | Freq: Every day | INTRAMUSCULAR | Status: DC

## 2021-09-07 MED ORDER — ONDANSETRON HCL 4 MG PO TABS: 4.0000 mg | ORAL_TABLET | Freq: Four times a day (QID) | ORAL | Status: DC | PRN

## 2021-09-07 MED ORDER — MIDODRINE HCL 5 MG PO TABS
5.0000 mg | ORAL_TABLET | Freq: Three times a day (TID) | ORAL | Status: DC
  Administered 2021-09-07 – 2021-09-26 (×56): 5 mg via ORAL
  Filled 2021-09-07 (×57): qty 1

## 2021-09-07 MED ORDER — PANTOPRAZOLE SODIUM 40 MG PO TBEC
40.0000 mg | DELAYED_RELEASE_TABLET | Freq: Every day | ORAL | Status: DC
Start: 2021-09-07 — End: 2021-09-26
  Administered 2021-09-07 – 2021-09-26 (×20): 40 mg via ORAL
  Filled 2021-09-07 (×20): qty 1

## 2021-09-07 MED ORDER — INSULIN ASPART 100 UNIT/ML IJ SOLN
0.0000 [IU] | Freq: Three times a day (TID) | INTRAMUSCULAR | Status: DC
  Administered 2021-09-08 – 2021-09-18 (×7): 2 [IU] via SUBCUTANEOUS

## 2021-09-07 MED ORDER — ACETAMINOPHEN 325 MG PO TABS
650.0000 mg | ORAL_TABLET | Freq: Four times a day (QID) | ORAL | Status: DC | PRN
  Administered 2021-09-19 – 2021-09-24 (×2): 650 mg via ORAL
  Filled 2021-09-07 (×2): qty 2

## 2021-09-07 MED ORDER — ONDANSETRON HCL 4 MG/2ML IJ SOLN: 4.0000 mg | Freq: Four times a day (QID) | INTRAMUSCULAR | Status: DC | PRN

## 2021-09-07 MED ORDER — ACETAMINOPHEN 325 MG PO TABS: 650.0000 mg | ORAL_TABLET | Freq: Four times a day (QID) | ORAL | Status: DC | PRN

## 2021-09-07 MED ORDER — ACETAMINOPHEN 650 MG RE SUPP
650.0000 mg | Freq: Four times a day (QID) | RECTAL | Status: DC | PRN
  Filled 2021-09-07: qty 1

## 2021-09-07 MED ORDER — ENOXAPARIN SODIUM 40 MG/0.4ML IJ SOSY
40.0000 mg | PREFILLED_SYRINGE | INTRAMUSCULAR | Status: DC
Start: 2021-09-07 — End: 2021-09-26
  Administered 2021-09-07 – 2021-09-25 (×19): 40 mg via SUBCUTANEOUS
  Filled 2021-09-07 (×18): qty 0.4

## 2021-09-07 NOTE — Discharge Summary (Signed)
Physician Discharge Summary  ?Daniel Bailey. ELF:[id number] DOB: Aug 02, 1950 DOA: 09/02/2021 ? ?PCP: [name], MD ? ?Admit date: 09/02/2021 ?Discharge date: 09/07/2021 ? ?Admitted From: Home ?Disposition: CIR ? ?Recommendations for Outpatient Follow-up:  ?Follow up with CIR provider at earliest convenience ?Continue outpatient follow-up with palliative care  ?follow up in ED if symptoms worsen or new appear ? ? ?Home Health: No ?Equipment/Devices: None ? ?Discharge Condition: Guarded to poor  ?CODE STATUS: DNR  ?diet recommendation: Heart healthy/as per SLP recommendations ?Diet recommendations: Nectar-thick liquid;Dysphagia 1 (puree) ?Liquids provided via: Cup;Straw ?Medication Administration: Crushed with puree ?Supervision: Staff to assist with self feeding ?Compensations: Slow rate;Small sips/bites;Clear throat intermittently;Follow solids with liquid ?Postural Changes and/or Swallow Maneuvers: Seated upright 90 degrees ? ?Brief/Interim Summary: ?71 y.o. male with medical history of intellectual disability, T2DM, HLD, anemia, recent aspiration pneumonia admission who presented with worsening alteration of mental status in the setting of recent cough.  He was admitted for aspiration pneumonia complicated by altered mental status.  CT head was negative for acute intracranial abnormity.  He was started on IV antibiotics.  During the hospitalization, mental status has gradually improved.  His swallowing is still an issue and patient remains a very high risk for aspiration.  He has completed antibiotic treatment.  He is currently on diet as per SLP recommendations.  Palliative care has been also involved for goals of care discussion.  He will be discharged to CIR once bed is available. ? ?Discharge Diagnoses:  ? ?Acute metabolic encephalopathy in a patient with baseline intellectual disability ?-Possibly due to infection.  Has occurred.  Equally since November associated with illness ?-MRI of brain negative for acute  abnormity.  EEG did not show any active seizures.  Ammonia/TSH within normal limit ?-Mental status has improved and possibly nearer to baseline ?-Continue fall/delirium precautions ?-Discharge to CIR once bed is available ? ?Left lower lobe pneumonia, most likely aspiration pneumonia which is recurrent ?-Still at very high risk for aspiration as per SLP evaluation.  Continue diet as per SLP recommendations ?-Has completed 5-day of antibiotic course with Rocephin and Zithromax. ?-Currently on room air. ?-Cultures negative so far ? ?Sinus bradycardia, first-degree AV block ?-Still intermittently bradycardic.  Cardiology has signed off and recommended no pacemaker and also recommend to avoid AV nodal blocking agents ? ?Normocytic anemia ?-Hemoglobin currently stable.  Outpatient follow-up ? ?Thrombocytopenia ?-Resolved ? ?Leukocytosis ?-Resolved ? ?Diabetes mellitus type 2 with intermittent hypoglycemia ?-Blood sugar still intermittently on the lower side.  Metformin to remain on hold. ? ?Recurrent hypothermia ?-Unclear cause. ?-TSH, cortisol within normal limit ?-Continue environmental warming. ? ?GERD ?-Resume PPI ? ?Hyperlipidemia ?-Continue statin and gemfibrozil on discharge ? ?AKI on CKD stage IIIa ?-Improved.  Creatinine currently at baseline.  Monitor intermittently. ? ?OSA ?-Will need outpatient follow-up to set up CPAP at home.  This can be arranged by PCP ? ?Goals of care ?Physical deconditioning ?-Overall prognosis is poor.  Palliative care following for goals of care discussion.  Will need continued palliative care follow-up as an outpatient ? ?Discharge Instructions ? ?Discharge Instructions   ? ? Amb Referral to Palliative Care   Complete by: As directed ?  ? Diet - low sodium heart healthy   Complete by: As directed ?  ? As per SLP recommendations: DYS 1 Room service appropriate? Yes; Fluid consistency: Nectar Thick  ? Increase activity slowly   Complete by: As directed ?  ? No wound care   Complete by:  As directed ?  ? ?  ? ?  Allergies as of 09/07/2021   ?No Known Allergies ?  ? ?  ?Medication List  ?  ? ?STOP taking these medications   ? ?metFORMIN 500 MG 24 hr tablet ?Commonly known as: GLUCOPHAGE-XR ?  ? ?  ? ?TAKE these medications   ? ?acetaminophen 325 MG tablet ?Commonly known as: TYLENOL ?Take 2 tablets (650 mg total) by mouth every 6 (six) hours as needed for mild pain (or Fever >/= 101). ?What changed:  ?medication strength ?how much to take ?when to take this ?reasons to take this ?  ?atorvastatin 10 MG tablet ?Commonly known as: LIPITOR ?Take 1 tablet (10 mg total) by mouth daily. In the evening ?  ?gemfibrozil 600 MG tablet ?Commonly known as: LOPID ?Take 1 tablet (600 mg total) by mouth 2 (two) times daily. ?  ?midodrine 5 MG tablet ?Commonly known as: PROAMATINE ?Take 1 tablet (5 mg total) by mouth 3 (three) times daily with meals. ?  ?pantoprazole 40 MG tablet ?Commonly known as: PROTONIX ?Take 1 tablet (40 mg total) by mouth daily. ?  ? ?  ? ?  ?  ? ? ?  ?Durable Medical Equipment  ?(From admission, onward)  ?  ? ? ?  ? ?  Start     Ordered  ? 09/05/21 1522  For home use only DME 3 n 1  Once       ? 09/05/21 1522  ? 09/05/21 1521  For home use only DME lightweight manual wheelchair with seat cushion  Once       ?Comments: Patient suffers from Acute Encephalopathy and weakness which impairs their ability to perform daily activities like bathing, dressing, grooming, and toileting in the home.  A cane or walker will not resolve  ?issue with performing activities of daily living. A wheelchair will allow patient to safely perform daily activities. Patient is not able to propel themselves in the home using a standard weight wheelchair due to general weakness. Patient can self propel in the lightweight wheelchair. Length of need 12 months . ?Accessories: elevating leg rests (ELRs), wheel locks, extensions and anti-tippers.  ? 09/05/21 1522  ? ?  ?  ? ?  ? ? Follow-up Information   ? ? [facility]  Follow up.   ?Specialty: Home Health Services ?Why: Someone will call you to schedule first home visit. ?Contact information: ?[address] ?[place] [address] ?[phone] ? ? ?  ?  ? ?  ?  ? ?  ? ?No Known Allergies ? ?Consultations: ?Palliative care/cardiology ? ? ?Procedures/Studies: ?CT Head Wo Contrast ? ?Result Date: 09/02/2021 ?CLINICAL DATA:  Mental status changes EXAM: CT HEAD WITHOUT CONTRAST TECHNIQUE: Contiguous axial images were obtained from the base of the skull through the vertex without intravenous contrast. RADIATION DOSE REDUCTION: This exam was performed according to the departmental dose-optimization program which includes automated exposure control, adjustment of the mA and/or kV according to patient size and/or use of iterative reconstruction technique. COMPARISON:  07/12/2021 FINDINGS: Brain: No evidence of acute infarction, hemorrhage, hydrocephalus, extra-axial collection or mass lesion/mass effect. Vascular: No hyperdense vessel or unexpected calcification. Skull: Normal. Negative for fracture or focal lesion. Sinuses/Orbits: Paranasal sinuses and mastoid air cells are clear. Other: None IMPRESSION: 1. No acute intracranial abnormalities. 2. Normal brain for age. Electronically Signed   By: Kerby Moors M.D.   On: 09/02/2021 15:35  ? ?MR BRAIN WO CONTRAST ? ?Result Date: 09/02/2021 ?CLINICAL DATA:  Altered mental status EXAM: MRI HEAD WITHOUT CONTRAST  TECHNIQUE: Multiplanar, multiecho pulse sequences of the brain and surrounding structures were obtained without intravenous contrast. COMPARISON:  No prior MRI, correlation is made with CT head 09/02/2021 FINDINGS: Evaluation is somewhat limited by motion artifact. Brain: No restricted diffusion to suggest acute or subacute infarct. No acute hemorrhage, mass, mass effect, or midline shift. No hydrocephalus or extra-axial collection. No foci of hemosiderin deposition to suggest remote hemorrhage. Scattered T2 hyperintense signal in  the periventricular white matter, likely the sequela of mild chronic small vessel ischemic disease. Vascular: Normal flow voids. Skull and upper cervical spine: Normal marrow signal. Sinuses/Orbits: Cl

## 2021-09-07 NOTE — Progress Notes (Signed)
Inpatient Rehabilitation Admission Medication Review by a Pharmacist ? ?A complete drug regimen review was completed for this patient to identify any potential clinically significant medication issues. ? ?High Risk Drug Classes Is patient taking? Indication by Medication  ?Antipsychotic No   ?Anticoagulant Yes Enoxaparin: VTE ppx  ?Antibiotic No   ?Opioid No   ?Antiplatelet No   ?Hypoglycemics/insulin Yes Insulin aspart: DM/hyperglycemia  ?Vasoactive Medication No   ?Chemotherapy No   ?Other Yes Midodrine: hypotension ?Pantoprazole: GERD  ? ? ? ?Type of Medication Issue Identified Description of Issue Recommendation(s)  ?Drug Interaction(s) (clinically significant) ?    ?Duplicate Therapy ?    ?Allergy ?    ?No Medication Administration End Date ?    ?Incorrect Dose ?    ?Additional Drug Therapy Needed ?    ?Significant med changes from prior encounter (inform family/care partners about these prior to discharge). PTA atorvastatin, gemfibrozil not reordered Resume during rehab admission or at discharge if appropriate  ?Other ?    ? ? ?Clinically significant medication issues were identified that warrant physician communication and completion of prescribed/recommended actions by midnight of the next day:  No ? ? ?Time spent performing this drug regimen review (minutes): 20 ? ? ?Thank you for allowing pharmacy to be a part of this patient?s care. ? ?Ardyth Harps, PharmD ?Clinical Pharmacist ? ?

## 2021-09-07 NOTE — TOC Transition Note (Signed)
Transition of Care (TOC) - CM/SW Discharge Note ? ? ?Patient Details  ?Name: Daniel Bailey. ?MRN: [id number] ?Date of Birth: [birth year] ? ?Transition of Care (TOC) CM/SW Contact:  ?Tom-Johnson, Renea Ee, RN ?Phone Number: ?09/07/2021, 12:34 PM ? ? ?Clinical Narrative:    ? ?Patient is scheduled for discharge to [facility] today. Legal Guardian notified and aware. Patient will be transported via bed as it's in hospital transfer. No further TOC needs noted. ? ? ?Final next level of care: [place] ?Barriers to Discharge: Barriers Resolved ? ? ?Patient Goals and CMS Choice ?Patient states their goals for this hospitalization and ongoing recovery are:: To go to AIR then return home. ?CMS Medicare.gov Compare Post Acute Care list provided to:: Legal Guardian ?Choice offered to / list presented to : [facility] POA / Guardian, Patient ? ?Discharge Placement ?  ?           ?  ?Patient to be transferred to facility by: Bed. In hospital transfer. ?  ?  ? ?Discharge Plan and Services ?  ?Discharge Planning Services: CM Consult ?Post Acute Care Choice: Home Health          ?DME Arranged: 3-N-1, Wheelchair manual ?DME Agency: [employer] ?Date DME Agency Contacted: 09/05/21 ?Time DME Agency Contacted: [phone] ?Representative spoke with at DME Agency: Delana Meyer ?HH Arranged: RN, PT ?[place] Agency: [place] ?Date HH Agency Contacted: 09/05/21 ?Time [place]: [phone] ?Representative spoke with at [place]: [place] (Resumption of care) ? ?Social Determinants of Health (SDOH) Interventions ?  ? ? ?Readmission Risk Interventions ?   ? View : No data to display.  ?  ?  ?  ? ? ? ? ? ?

## 2021-09-07 NOTE — Discharge Instructions (Signed)
Inpatient Rehab Discharge Instructions ? ?[name]. ?Discharge date and time: No discharge date for patient encounter.  ? ?Activities/Precautions/ Functional Status: ?Activity: activity as tolerated ?Diet:  ?Wound Care: Routine skin checks ?Functional status:  ?___ No restrictions     ___ Walk up steps independently ?___ 24/7 supervision/assistance   ___ Walk up steps with assistance ?___ Intermittent supervision/assistance  ___ Bathe/dress independently ?___ Walk with walker     __x_ Bathe/dress with assistance ?___ Walk Independently    ___ Shower independently ?___ Walk with assistance    ___ Shower with assistance ?___ No alcohol     ___ Return to work/school ________ ? ?Special Instructions: ? ?No driving smoking or alcohol ? ?My questions have been answered and I understand these instructions. I will adhere to these goals and the provided educational materials after my discharge from the hospital. ? ?Patient/Caregiver Signature _______________________________ Date __________ ? ?Clinician Signature _______________________________________ Date __________ ? ?Please bring this form and your medication list with you to all your follow-up doctor's appointments.   ?

## 2021-09-07 NOTE — Progress Notes (Signed)
Inpatient Rehab Admissions Coordinator:  ? ?I have a bed available for pt to admit to CIR today.  Dr. Starla Link in agreement and [facility] aware.  I will call to update family as soon as I have a room assignment.   ? ?Shann Medal, PT, DPT ?Admissions Coordinator ?[phone] ?09/07/21  ?11:05 AM ? ?

## 2021-09-07 NOTE — Progress Notes (Signed)
Report called to receiving RN on 5C. ?

## 2021-09-07 NOTE — H&P (Incomplete)
? ? ?Physical Medicine and Rehabilitation Admission H&P ? ?  ?Chief Complaint  ?Patient presents with  ? Altered Mental Status  ?: ?HPI: Daniel Bailey, Daniel Bailey. 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  Prior GIB, diabetes mellitus, anemia with hematology work-up in the past, hyperlipidemia, recent recurrent aspiration pneumonia admission.  Per chart review patient lives with a cousin and good family support with 24-hour assistance as needed.  1 level home with ramped entrance.  Patient able to assist with chores around the house and independent with ADLs.  Presented 09/02/2021 with cough over the past few days without fever and reports of lethargy with some bizarre behavior.  CT/MRI of the brain showed no acute process.  Chest x-ray retrocardiac infiltrate as seen on prior comparisons.  Urine culture less than 10,000 insignificant growth, WBC 13,400, hemoglobin 9.7, creatinine 1.48, blood cultures no growth to date, lactic acid 0.8, ammonia level 18, procalcitonin level 0.28.  Renal ultrasound showed no evidence of hydronephrosis or renal mass.  EEG negative for seizure.  He was placed on broad-spectrum antibiotics with ceftriaxone/azithromycin x5 days completed 09/06/2021 and leukocytosis improved 6.7.  Patient's mental status continues to improve suspect acute encephalopathy multifactorial patient now close to baseline.  He is currently maintained on a dysphagia #1 nectar thick liquid diet.  Palliative care consulted to establish goals of care.  He is maintained on Lovenox for DVT prophylaxis.  Therapy evaluations completed due to patient decreased functional mobility altered mental status was admitted for a comprehensive rehab program. ? ?Review of Systems  ?Constitutional:  Positive for malaise/fatigue. Negative for chills and fever.  ?HENT:  Negative for hearing loss.   ?Eyes:  Negative for blurred vision and  double vision.  ?Respiratory:  Positive for cough. Negative for shortness of breath and wheezing.   ?Cardiovascular:  Negative for chest pain, palpitations and leg swelling.  ?Gastrointestinal:  Positive for constipation. Negative for heartburn, nausea and vomiting.  ?     GERD  ?Genitourinary:  Positive for urgency. Negative for dysuria, flank pain and hematuria.  ?Musculoskeletal:  Positive for myalgias.  ?Skin:  Negative for rash.  ?Psychiatric/Behavioral:    ?     Intellectual disability  ?All other systems reviewed and are negative. ?Past Medical History:  ?Diagnosis Date  ? Anemia   ? Elevated PSA   ? Frequent urination   ? Glaucoma   ? History of COVID-19   ? Hyperglycemia   ? Hyperlipidemia   ? Hypertension   ? Sleep apnea   ? ?Past Surgical History:  ?Procedure Laterality Date  ? COLONOSCOPY    ? PROSTATE BIOPSY N/A 11/20/2017  ? Procedure: BIOPSY TRANSRECTAL ULTRASONIC PROSTATE (TUBP);  Surgeon: Abbie Sons, MD;  Location: [facility] ORS;  Service: Urology;  Laterality: N/A;  ? ?Family History  ?Problem Relation Age of Onset  ? Colon cancer Mother   ? Kidney disease Mother   ? Cancer Father   ?     unknown  ? ?Social History:  reports that he has never smoked. He has never used smokeless tobacco. He reports that he does not drink alcohol and does not use drugs. ?Allergies: No Known Allergies ?Medications Prior to Admission  ?Medication Sig Dispense Refill  ? acetaminophen (TYLENOL) 500 MG tablet Take 1,000 mg by mouth daily as needed for headache.    ? atorvastatin (LIPITOR) 10 MG tablet Take 1 tablet (10 mg total) by mouth daily. In the evening  90 tablet 3  ? gemfibrozil (LOPID) 600 MG tablet Take 1 tablet (600 mg total) by mouth 2 (two) times daily. 180 tablet 3  ? metFORMIN (GLUCOPHAGE-XR) 500 MG 24 hr tablet Take 2 tablets (1,000 mg total) by mouth daily with breakfast. 180 tablet 3  ? midodrine (PROAMATINE) 5 MG tablet Take 1 tablet (5 mg total) by mouth 3 (three) times daily with meals. 90 tablet 2  ?  pantoprazole (PROTONIX) 40 MG tablet Take 1 tablet (40 mg total) by mouth daily. 30 tablet 1  ? ? ? ? ?Home: ?Home Living ?Family/patient expects to be discharged to:: Private residence ?Living Arrangements: Other relatives (cousin) ?Available Help at Discharge: Family ?Type of Home: Mobile home ?Home Access: Ramped entrance ?Home Layout: One level ?Bathroom Shower/Tub: Tub/shower unit ?Bathroom Toilet: Standard ?Home Equipment: Conservation officer, nature (2 wheels) ?Additional Comments: home living info and prior function info gathered from chart ?  ?Functional History: ?Prior Function ?Prior Level of Function : Independent/Modified Independent ?Mobility Comments: pt does not use DME, no falls in last 6 months ?ADLs Comments: pt able to assist with chores around the house and independent with ADLs, but family does not let him cook. ? ?Functional Status:  ?Mobility: ?Bed Mobility ?Overal bed mobility: Needs Assistance ?Bed Mobility: Sit to Supine ?Supine to sit: Mod assist ?Sit to supine: Max assist, HOB elevated ?General bed mobility comments: Mod A with trunk elevation and scooting hips to EOB. Pt requiring simple VC's and difficulty problem solving with more complex verbal cues. ?Transfers ?Overall transfer level: Needs assistance ?Equipment used: Rolling walker (2 wheels) ?Transfers: Sit to/from Stand, Bed to chair/wheelchair/BSC ?Sit to Stand: Mod assist ?Bed to/from chair/wheelchair/BSC transfer type:: Step pivot ?Step pivot transfers: Mod assist ?General transfer comment: Mod A for lift assist and stabilizing during transfers. Pt requiring simple verbal cues with bilateral knee buckling. Used RW for UE support, however, pt needed cues for hand placement and RW management since he has never used this AD before. ?Ambulation/Gait ?Ambulation/Gait assistance: Mod assist ?Gait Distance (Feet): 30 Feet ?Assistive device: Rolling walker (2 wheels) ?Gait Pattern/deviations: Step-to pattern, Narrow base of support, Trunk flexed,  Knee flexed in stance - right, Knee flexed in stance - left, Decreased weight shift to right, Decreased weight shift to left, Decreased stride length ?General Gait Details: required mod A for stabilizing while pt used RW for UE support. Pt required cues for RW proximity and upright posture. SPT standing behind pt facilitating upright posture with close chair follow behind. Pt difficulty sequencing steps initially and slowly improved. Pt knees buckling throughout with increased buckling at end of short bout gait. Pt smiling throughout. VSS during activity with HR responding appropriately throughout (see general comments). ?Gait velocity: decreased ?Gait velocity interpretation: <1.31 ft/sec, indicative of household ambulator ?  ? ?ADL: ?ADL ?Overall ADL's : Needs assistance/impaired ?Grooming: Wash/dry hands, Wash/dry face, Brushing hair, Minimal assistance, Sitting ?Grooming Details (indicate cue type and reason): min assist with  combing hair ?Lower Body Dressing: Moderate assistance, Sitting/lateral leans ?Lower Body Dressing Details (indicate cue type and reason): able to doff socks required mod assist to donn ?Toilet Transfer: Rolling walker (2 wheels), Moderate assistance ?Toilet Transfer Details (indicate cue type and reason): simulated to recliner ?General ADL Comments: followed one step commands with increased time ? ?Cognition: ?Cognition ?Overall Cognitive Status: History of cognitive impairments - at baseline ?Orientation Level: Oriented to person, Disoriented to place, Disoriented to time, Disoriented to situation ?Cognition ?Arousal/Alertness: Awake/alert ?Behavior During Therapy: [facility] for tasks assessed/performed ?Overall  Cognitive Status: History of cognitive impairments - at baseline ?Area of Impairment: Attention, Following commands, Safety/judgement, Awareness, Problem solving ?Current Attention Level: Sustained ?Following Commands: Follows one step commands inconsistently, Follows one step commands  with increased time ?Safety/Judgement: Decreased awareness of safety, Decreased awareness of deficits ?Awareness: Intellectual ?Problem Solving: Slow processing, Requires verbal cues, Requires tactil

## 2021-09-07 NOTE — Progress Notes (Signed)
PMR Admission Coordinator Pre-Admission Assessment ?  ?Patient: Daniel Bailey. is an 71 y.o., male ?MRN: [id number] ?DOB: 1951/04/14 ?Height:   ?Weight: 84.5 kg ?  ?Insurance Information ?HMO:     PPO:      PCP:      IPA:      80/20:      OTHER:  ?PRIMARY: Medicare A/B      Policy#: [id number]      Subscriber: pt ?CM Name:       Phone#:      Fax#:  ?Pre-Cert#: verified online      Employer:  ?Benefits:  Phone #:      Name:  ?Eff. Date: 10/06/88 A/B     Deduct: $1600      Out of Pocket Max: n/a      Life Max: n/a ?CIR: 100%      SNF: 20 full days ?Outpatient: 80%     Co-Ins: 20% ?Home Health: 100%      Co-Pay:  ?DME: 80%     Co-Ins: 20% ?Providers:  ?SECONDARY:       Policy#:      Phone#:  ?  ?Financial Counselor:       Phone#:  ?  ?The ?Data Collection Information Summary? for patients in Inpatient Rehabilitation Facilities with attached ?Privacy Act [place] Records? was provided and verbally reviewed with: Family ?  ?Emergency Contact Information ?Contact Information   ?  ?  Name Relation Home Work Mobile  ?  Daniel Bailey ([place]) Relative [phone]      ?  Daniel Bailey [phone]   [id number]  ?  ?   ?  ?  ?Current Medical History  ?Patient Admitting Diagnosis: encephalopathy ?  ?History of Present Illness: Daniel Bailey, Daniel Bailey. 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  Prior GIB, diabetes mellitus, anemia with hematology work-up in the past, hyperlipidemia, recent recurrent aspiration pneumonia admission.   Presented 09/02/2021 with cough over the past few days without fever and reports of lethargy with some bizarre behavior.  CT/MRI of the brain showed no acute process.  Chest x-ray retrocardiac infiltrate as seen on prior comparisons.  Urine culture less than 10,000 insignificant growth, WBC 13,400, hemoglobin 9.7, creatinine 1.48, blood cultures no growth to date,  lactic acid 0.8, ammonia level 18, procalcitonin level 0.28.  Renal ultrasound showed no evidence of hydronephrosis or renal mass.  EEG negative for seizure.  He was placed on broad-spectrum antibiotics with ceftriaxone/azithromycin x5 days completed 09/06/2021 and leukocytosis improved 6.7.  Patient's mental status continues to improve suspect acute encephalopathy multifactorial patient now close to baseline.  He is currently maintained on a dysphagia #1 nectar thick liquid diet.  Palliative care consulted to establish goals of care.  He is maintained on Lovenox for DVT prophylaxis.  Therapy evaluations completed due to patient decreased functional mobility altered mental status was recommended for a comprehensive rehab program. ?  ?Patient's [facility] record [facility] has been reviewed by the rehabilitation admission coordinator and physician. ?  ?Past Medical History  ?    ?Past Medical History:  ?Diagnosis Date  ? Anemia    ? Elevated PSA    ? Frequent urination    ? Glaucoma    ? History of COVID-19    ? Hyperglycemia    ? Hyperlipidemia    ? Hypertension    ? Sleep apnea    ?  ?  ?Has the patient  had major surgery during 100 days prior to admission? No ?  ?Family History   ?family history includes Cancer in his father; Colon cancer in his mother; Kidney disease in his mother. ?  ?Current Medications ?  ?Current Facility-Administered Medications:  ?  acetaminophen (TYLENOL) tablet 650 mg, 650 mg, Oral, Q6H PRN **OR** acetaminophen (TYLENOL) suppository 650 mg, 650 mg, Rectal, Q6H PRN, Vance Gather B, MD ?  dextrose 5 % in lactated ringers infusion, , Intravenous, Continuous, Patrecia Pour, MD, Last Rate: 100 mL/hr at 09/07/21 0359, New Bag at 09/07/21 0359 ?  enoxaparin (LOVENOX) injection 40 mg, 40 mg, Subcutaneous, Q24H, Patrecia Pour, MD, 40 mg at 09/06/21 2213 ?  iohexol (OMNIPAQUE) 300 MG/ML solution 50 mL, 50 mL, Oral, Once PRN, Patrecia Pour, MD ?  midodrine (PROAMATINE) tablet 5 mg, 5 mg, Oral, TID WC,  Patrecia Pour, MD, 5 mg at 09/07/21 0743 ?  ondansetron (ZOFRAN) tablet 4 mg, 4 mg, Oral, Q6H PRN **OR** ondansetron (ZOFRAN) injection 4 mg, 4 mg, Intravenous, Q6H PRN, Patrecia Pour, MD ?  sodium chloride flush (NS) 0.9 % injection 3 mL, 3 mL, Intravenous, Q12H, Patrecia Pour, MD, 3 mL at 09/04/21 2140 ?  ?Patients Current Diet:  ?Diet Order   ?  ?         ?    Diet - low sodium heart healthy       ?  ?    DIET - DYS 1 Room service appropriate? Yes; Fluid consistency: Nectar Thick  Diet effective now       ?  ?  ?   ?  ?  ?   ?  ?  ?Precautions / Restrictions ?Precautions ?Precautions: Fall ?Restrictions ?Weight Bearing Restrictions: No  ?  ?Has the patient had 2 or more falls or a fall with injury in the past year? No ?  ?Prior Activity Level ?Limited Community (1-2x/wk): went out for appointments, does not drive, does not use DME, assist for meals/med management, intellectual disability at baseline but can do simple ADLs and household tasks ?  ?Prior Functional Level ?Self Care: Did the patient need help bathing, dressing, using the toilet or eating? Independent ?  ?Indoor Mobility: Did the patient need assistance with walking from room to room (with or without device)? Independent ?  ?Stairs: Did the patient need assistance with internal or external stairs (with or without device)? Independent ?  ?Functional Cognition: Did the patient need help planning regular tasks such as shopping or remembering to take medications? Independent ?  ?Patient Information ?Are you of Hispanic, Latino/a,or Spanish origin?: A. No, not of Hispanic, Latino/a, or Spanish origin ?What is your race?: B. Black or African American ?Do you need or want an interpreter to communicate with a doctor or health care staff?: 0. No ?  ?Patient's Response To:  ?Health Literacy and Transportation ?Is the patient able to respond to health literacy and transportation needs?: No ?Health Literacy - How often do you need to have someone help you when  you read instructions, pamphlets, or other written material from your doctor or pharmacy?: Patient unable to respond ?In the past 12 months, has lack of transportation kept you from medical appointments or from getting medications?: No ?In the past 12 months, has lack of transportation kept you from meetings, work, or from getting things needed for daily living?: No ?  ?Home Assistive Devices / Equipment ?Home Equipment: Conservation officer, nature (2 wheels) ?  ?Prior Device Use:  Indicate devices/aids used by the patient prior to current illness, exacerbation or injury? None of the above ?  ?Current Functional Level ?Cognition ?  Overall Cognitive Status: History of cognitive impairments - at baseline ?Current Attention Level: Sustained ?Orientation Level: Oriented to person, Disoriented to place, Disoriented to time, Disoriented to situation ?Following Commands: Follows one step commands inconsistently, Follows one step commands with increased time ?Safety/Judgement: Decreased awareness of safety, Decreased awareness of deficits ?General Comments: able to speak short phrases today and respond to "scoot forward in bed" and tactile cues. Pt pleasant and sweet. ?   ?Extremity Assessment ?(includes Sensation/Coordination) ?  Upper Extremity Assessment: Difficult to assess due to impaired cognition  ?Lower Extremity Assessment: Defer to PT evaluation ?RLE Deficits / Details: pt generating good force spontaneously, but poor command following for MMT. impaired coordination ?RLE Coordination: decreased fine motor, decreased gross motor ?LLE Deficits / Details: pt generating good force spontaneously, but poor command following for MMT. impaired coordination ?LLE Coordination: decreased fine motor, decreased gross motor  ?   ?ADLs ?  Overall ADL's : Needs assistance/impaired ?Grooming: Wash/dry hands, Wash/dry face, Brushing hair, Minimal assistance, Sitting ?Grooming Details (indicate cue type and reason): min assist with  combing  hair ?Lower Body Dressing: Moderate assistance, Sitting/lateral leans ?Lower Body Dressing Details (indicate cue type and reason): able to doff socks required mod assist to donn ?Toilet Transfer: Rolling walk

## 2021-09-07 NOTE — H&P (Signed)
? ? ?  ?Physical Medicine and Rehabilitation Admission H&P ?  ?  ?   ?Chief Complaint  ?Patient presents with  ? Altered Mental Status  ?: ?HPI: Daniel Bailey, [name] 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  Prior GIB, diabetes mellitus, anemia with hematology work-up in the past, hyperlipidemia, recent recurrent aspiration pneumonia admission.  Per chart review patient lives with a cousin and good family support with 24-hour assistance as needed.  1 level home with ramped entrance.  Patient able to assist with chores around the house and independent with ADLs.  Presented 09/02/2021 with cough over the past few days without fever and reports of lethargy with some bizarre behavior.  CT/MRI of the brain showed no acute process.  Chest x-ray retrocardiac infiltrate as seen on prior comparisons.  Urine culture less than 10,000 insignificant growth, WBC 13,400, hemoglobin 9.7, creatinine 1.48, blood cultures no growth to date, lactic acid 0.8, ammonia level 18, procalcitonin level 0.28.  Renal ultrasound showed no evidence of hydronephrosis or renal mass.  EEG negative for seizure but did show encephalopathy.  He was placed on broad-spectrum antibiotics with ceftriaxone/azithromycin x5 days completed 09/06/2021 and leukocytosis improved 6.7.  Patient's mental status continues to improve suspect acute encephalopathy multifactorial patient now close to baseline.  He is currently maintained on a dysphagia #1 nectar thick liquid diet.  PEG considered but was declined. Palliative care consulted to establish goals of care.  He is maintained on Lovenox for DVT prophylaxis.  Therapy evaluations completed due to patient decreased functional mobility altered mental status was admitted for a comprehensive rehab program. ?  ?Review of Systems  ?Constitutional:  Positive for malaise/fatigue. Negative for chills and fever.  ?HENT:   Negative for hearing loss.   ?Eyes:  Negative for blurred vision and double vision.  ?Respiratory:  Positive for cough. Negative for shortness of breath and wheezing.   ?Cardiovascular:  Negative for chest pain, palpitations and leg swelling.  ?Gastrointestinal:  Positive for constipation. Negative for heartburn, nausea and vomiting.  ?     GERD  ?Genitourinary:  Positive for urgency. Negative for dysuria, flank pain and hematuria.  ?Musculoskeletal:  Positive for myalgias.  ?Skin:  Negative for rash.  ?Psychiatric/Behavioral:    ?     Intellectual disability  ?All other systems reviewed and are negative. ?    ?Past Medical History:  ?Diagnosis Date  ? Anemia    ? Elevated PSA    ? Frequent urination    ? Glaucoma    ? History of COVID-19    ? Hyperglycemia    ? Hyperlipidemia    ? Hypertension    ? Sleep apnea    ?  ?     ?Past Surgical History:  ?Procedure Laterality Date  ? COLONOSCOPY      ? PROSTATE BIOPSY N/A 11/20/2017  ?  Procedure: BIOPSY TRANSRECTAL ULTRASONIC PROSTATE (TUBP);  Surgeon: Abbie Sons, MD;  Location: [facility] ORS;  Service: Urology;  Laterality: N/A;  ?  ?     ?Family History  ?Problem Relation Age of Onset  ? Colon cancer Mother    ? Kidney disease Mother    ? Cancer Father    ?      unknown  ?  ?Social History:  reports that he has never smoked. He has never used smokeless tobacco. He reports that he does not drink alcohol and does not use drugs. ?Allergies: No  Known Allergies ?      ?Medications Prior to Admission  ?Medication Sig Dispense Refill  ? acetaminophen (TYLENOL) 500 MG tablet Take 1,000 mg by mouth daily as needed for headache.      ? atorvastatin (LIPITOR) 10 MG tablet Take 1 tablet (10 mg total) by mouth daily. In the evening 90 tablet 3  ? gemfibrozil (LOPID) 600 MG tablet Take 1 tablet (600 mg total) by mouth 2 (two) times daily. 180 tablet 3  ? metFORMIN (GLUCOPHAGE-XR) 500 MG 24 hr tablet Take 2 tablets (1,000 mg total) by mouth daily with breakfast. 180 tablet 3  ?  midodrine (PROAMATINE) 5 MG tablet Take 1 tablet (5 mg total) by mouth 3 (three) times daily with meals. 90 tablet 2  ? pantoprazole (PROTONIX) 40 MG tablet Take 1 tablet (40 mg total) by mouth daily. 30 tablet 1  ?  ?  ?  ?  ?Home: ?Home Living ?Family/patient expects to be discharged to:: Private residence ?Living Arrangements: Other relatives (cousin) ?Available Help at Discharge: Family ?Type of Home: Mobile home ?Home Access: Ramped entrance ?Home Layout: One level ?Bathroom Shower/Tub: Tub/shower unit ?Bathroom Toilet: Standard ?Home Equipment: Conservation officer, nature (2 wheels) ?Additional Comments: home living info and prior function info gathered from chart ?  ?Functional History: ?Prior Function ?Prior Level of Function : Independent/Modified Independent ?Mobility Comments: pt does not use DME, no falls in last 6 months ?ADLs Comments: pt able to assist with chores around the house and independent with ADLs, but family does not let him cook. ?  ?Functional Status:  ?Mobility: ?Bed Mobility ?Overal bed mobility: Needs Assistance ?Bed Mobility: Sit to Supine ?Supine to sit: Mod assist ?Sit to supine: Max assist, HOB elevated ?General bed mobility comments: Mod A with trunk elevation and scooting hips to EOB. Pt requiring simple VC's and difficulty problem solving with more complex verbal cues. ?Transfers ?Overall transfer level: Needs assistance ?Equipment used: Rolling walker (2 wheels) ?Transfers: Sit to/from Stand, Bed to chair/wheelchair/BSC ?Sit to Stand: Mod assist ?Bed to/from chair/wheelchair/BSC transfer type:: Step pivot ?Step pivot transfers: Mod assist ?General transfer comment: Mod A for lift assist and stabilizing during transfers. Pt requiring simple verbal cues with bilateral knee buckling. Used RW for UE support, however, pt needed cues for hand placement and RW management since he has never used this AD before. ?Ambulation/Gait ?Ambulation/Gait assistance: Mod assist ?Gait Distance (Feet): 30  Feet ?Assistive device: Rolling walker (2 wheels) ?Gait Pattern/deviations: Step-to pattern, Narrow base of support, Trunk flexed, Knee flexed in stance - right, Knee flexed in stance - left, Decreased weight shift to right, Decreased weight shift to left, Decreased stride length ?General Gait Details: required mod A for stabilizing while pt used RW for UE support. Pt required cues for RW proximity and upright posture. SPT standing behind pt facilitating upright posture with close chair follow behind. Pt difficulty sequencing steps initially and slowly improved. Pt knees buckling throughout with increased buckling at end of short bout gait. Pt smiling throughout. VSS during activity with HR responding appropriately throughout (see general comments). ?Gait velocity: decreased ?Gait velocity interpretation: <1.31 ft/sec, indicative of household ambulator ?  ?ADL: ?ADL ?Overall ADL's : Needs assistance/impaired ?Grooming: Wash/dry hands, Wash/dry face, Brushing hair, Minimal assistance, Sitting ?Grooming Details (indicate cue type and reason): min assist with  combing hair ?Lower Body Dressing: Moderate assistance, Sitting/lateral leans ?Lower Body Dressing Details (indicate cue type and reason): able to doff socks required mod assist to donn ?Toilet Transfer: Rolling walker (2 wheels),  Moderate assistance ?Toilet Transfer Details (indicate cue type and reason): simulated to recliner ?General ADL Comments: followed one step commands with increased time ?  ?Cognition: ?Cognition ?Overall Cognitive Status: History of cognitive impairments - at baseline ?Orientation Level: Oriented to person, Disoriented to place, Disoriented to time, Disoriented to situation ?Cognition ?Arousal/Alertness: Awake/alert ?Behavior During Therapy: [facility] for tasks assessed/performed ?Overall Cognitive Status: History of cognitive impairments - at baseline ?Area of Impairment: Attention, Following commands, Safety/judgement, Awareness, Problem  solving ?Current Attention Level: Sustained ?Following Commands: Follows one step commands inconsistently, Follows one step commands with increased time ?Safety/Judgement: Decreased awareness of safety, Decreased

## 2021-09-07 NOTE — Progress Notes (Signed)
Occupational Therapy Treatment ?Patient Details ?Name: Daniel Bailey. ?MRN: [id number] ?DOB: Mar 16, 1951 ?Today's Date: 09/07/2021 ? ? ?History of present illness The pt is a 71 yo male presenting 4/28 with AMS and possible aphasia for about 2 days. Pt suspected to have acute encephalopathy due to infection and LLL PNA. PMH includes: intellectual disability, DM II, HLD, HTN, anemia, and recent aspiration PNA. ?  ?OT comments ? Patient received in bed and was more vocal today, speaking in small phrases and answering appropriately. Patient required increased time and mod assist to get to EOB. Patient ambulated short distance to sink to perform grooming and was able to perform tasks standing but required seated rest breaks between tasks. Patient ambulated to recliner with mod assist and verbal cues for safety.  Patient continues to make good gains and is a good candidate for AIR for increased rehab.   ? ?Recommendations for follow up therapy are one component of a multi-disciplinary discharge planning process, led by the attending physician.  Recommendations may be updated based on patient status, additional functional criteria and insurance authorization. ?   ?Follow Up Recommendations ? Skilled nursing-short term rehab (<3 hours/day)  ?  ?Assistance Recommended at Discharge Frequent or constant Supervision/Assistance  ?Patient can return home with the following ? A lot of help with walking and/or transfers;A lot of help with bathing/dressing/bathroom;Assistance with cooking/housework ?  ?Equipment Recommendations ? BSC/3in1;Wheelchair (measurements OT)  ?  ?Recommendations for Other Services   ? ?  ?Precautions / Restrictions Precautions ?Precautions: Fall ?Restrictions ?Weight Bearing Restrictions: No  ? ? ?  ? ?Mobility Bed Mobility ?Overal bed mobility: Needs Assistance ?Bed Mobility: Sit to Supine ?  ?  ?Supine to sit: Mod assist ?  ?  ?General bed mobility comments: increased time and assistance to scoot hips  forward ?  ? ?Transfers ?Overall transfer level: Needs assistance ?Equipment used: Rolling walker (2 wheels) ?Transfers: Sit to/from Stand, Bed to chair/wheelchair/BSC ?Sit to Stand: Mod assist ?  ?  ?Step pivot transfers: Mod assist ?  ?  ?General transfer comment: verbal cues for hand placement.  Mod assist to power up and to stedy with walker ?  ?  ?Balance Overall balance assessment: Needs assistance ?Sitting-balance support: Single extremity supported, Feet supported ?Sitting balance-Leahy Scale: Fair ?  ?Postural control: Posterior lean ?Standing balance support: Bilateral upper extremity supported, During functional activity, Reliant on assistive device for balance, Single extremity supported ?Standing balance-Leahy Scale: Poor ?Standing balance comment: reliant on RW or sink for balance ?  ?  ?  ?  ?  ?  ?  ?  ?  ?  ?  ?   ? ?ADL either performed or assessed with clinical judgement  ? ?ADL Overall ADL's : Needs assistance/impaired ?  ?  ?Grooming: Wash/dry hands;Wash/dry face;Oral care;Minimal assistance;Standing ?Grooming Details (indicate cue type and reason): stood at sink with min assist to min guard for balance.  Required assistance to turn on water ?  ?  ?  ?  ?  ?  ?  ?  ?Toilet Transfer: Rolling walker (2 wheels);Moderate assistance ?Toilet Transfer Details (indicate cue type and reason): simulated to recliner ?  ?  ?  ?  ?  ?General ADL Comments: required increased time to follow commands. Stood at sink for grooming with seated rest break between tasks ?  ? ?Extremity/Trunk Assessment   ?  ?  ?  ?  ?  ? ?Vision   ?  ?  ?Perception   ?  ?  Praxis   ?  ? ?Cognition Arousal/Alertness: Awake/alert ?Behavior During Therapy: [facility] for tasks assessed/performed ?Overall Cognitive Status: History of cognitive impairments - at baseline ?Area of Impairment: Attention, Following commands, Safety/judgement, Awareness, Problem solving ?  ?  ?  ?  ?  ?  ?  ?  ?  ?Current Attention Level: Sustained ?  ?Following  Commands: Follows one step commands inconsistently, Follows one step commands with increased time ?Safety/Judgement: Decreased awareness of safety, Decreased awareness of deficits ?Awareness: Intellectual ?Problem Solving: Slow processing, Requires verbal cues, Requires tactile cues, Difficulty sequencing ?General Comments: more verbal today ?  ?  ?   ?Exercises   ? ?  ?Shoulder Instructions   ? ? ?  ?General Comments    ? ? ?Pertinent Vitals/ Pain       Pain Assessment ?Pain Assessment: No/denies pain ? ?Home Living   ?  ?  ?  ?  ?  ?  ?  ?  ?  ?  ?  ?  ?  ?  ?  ?  ?  ?  ? ?  ?Prior Functioning/Environment    ?  ?  ?  ?   ? ?Frequency ? Min 2X/week  ? ? ? ? ?  ?Progress Toward Goals ? ?OT Goals(current goals can now be found in the care plan section) ? Progress towards OT goals: Progressing toward goals ? ?Acute Rehab OT Goals ?OT Goal Formulation: Patient unable to participate in goal setting ?Time For Goal Achievement: 09/17/21 ?Potential to Achieve Goals: Good ?ADL Goals ?Pt Will Perform Grooming: with supervision;standing ?Pt Will Perform Lower Body Dressing: with supervision;sit to/from stand ?Pt Will Transfer to Toilet: with supervision;ambulating ?Additional ADL Goal #1: Pt will follow 2-3 step commands >75% during ADL completion.  ?Plan Discharge plan remains appropriate   ? ?Co-evaluation ? ? ?   ?  ?  ?  ?  ? ?  ?AM-PAC OT "6 Clicks" Daily Activity     ?Outcome Measure ? ? Help from another person eating meals?: A Lot ?Help from another person taking care of personal grooming?: A Little ?Help from another person toileting, which includes using toliet, bedpan, or urinal?: Total ?Help from another person bathing (including washing, rinsing, drying)?: A Lot ?Help from another person to put on and taking off regular upper body clothing?: A Lot ?Help from another person to put on and taking off regular lower body clothing?: A Lot ?6 Click Score: 12 ? ?  ?End of Session Equipment Utilized During Treatment: Gait  belt;Rolling walker (2 wheels) ? ?OT Visit Diagnosis: Other abnormalities of gait and mobility (R26.89);Muscle weakness (generalized) (M62.81);Other symptoms and signs involving cognitive function ?  ?Activity Tolerance Patient tolerated treatment well ?  ?Patient Left in chair;with call bell/phone within reach;with chair alarm set ?  ?Nurse Communication Mobility status ?  ? ?   ? ?Time: 1010-1036 ?OT Time Calculation (min): 26 min ? ?Charges: OT General Charges ?$OT Visit: 1 Visit ?OT Treatments ?$Self Care/Home Management : 23-37 mins ? ?Lodema Hong, OTA ?Acute Rehabilitation Services  ?Pager [phone] ?Office [phone] ? ? ?Boyle ?09/07/2021, 12:14 PM ?

## 2021-09-08 DIAGNOSIS — G9340 Encephalopathy, unspecified: Secondary | ICD-10-CM

## 2021-09-08 DIAGNOSIS — R1312 Dysphagia, oropharyngeal phase: Secondary | ICD-10-CM

## 2021-09-08 DIAGNOSIS — D649 Anemia, unspecified: Secondary | ICD-10-CM | POA: Diagnosis not present

## 2021-09-08 DIAGNOSIS — N1831 Chronic kidney disease, stage 3a: Secondary | ICD-10-CM | POA: Diagnosis not present

## 2021-09-08 DIAGNOSIS — E119 Type 2 diabetes mellitus without complications: Secondary | ICD-10-CM | POA: Diagnosis not present

## 2021-09-08 LAB — COMPREHENSIVE METABOLIC PANEL
ALT: 15 U/L (ref 0–44)
AST: 23 U/L (ref 15–41)
Albumin: 2.3 g/dL — ABNORMAL LOW (ref 3.5–5.0)
Alkaline Phosphatase: 82 U/L (ref 38–126)
Anion gap: 7 (ref 5–15)
BUN: 17 mg/dL (ref 8–23)
CO2: 27 mmol/L (ref 22–32)
Calcium: 9.1 mg/dL (ref 8.9–10.3)
Chloride: 108 mmol/L (ref 98–111)
Creatinine, Ser: 1.27 mg/dL — ABNORMAL HIGH (ref 0.61–1.24)
GFR, Estimated: >60 mL/min (ref >60)
Glucose, Bld: 85 mg/dL (ref 70–99)
Potassium: 3.9 mmol/L (ref 3.5–5.1)
Sodium: 142 mmol/L (ref 135–145)
Total Bilirubin: 0.4 mg/dL (ref 0.3–1.2)
Total Protein: 5.5 g/dL — ABNORMAL LOW (ref 6.5–8.1)

## 2021-09-08 LAB — CBC WITH DIFFERENTIAL/PLATELET
Abs Immature Granulocytes: 0.02 10*3/uL (ref 0.00–0.07)
Basophils Absolute: 0 10*3/uL (ref 0.0–0.1)
Basophils Relative: 0 %
Eosinophils Absolute: 0.1 10*3/uL (ref 0.0–0.5)
Eosinophils Relative: 4 %
HCT: 25.5 % — ABNORMAL LOW (ref 39.0–52.0)
Hemoglobin: 8.4 g/dL — ABNORMAL LOW (ref 13.0–17.0)
Immature Granulocytes: 1 %
Lymphocytes Relative: 33 %
Lymphs Abs: 1.1 10*3/uL (ref 0.7–4.0)
MCH: 30.9 pg (ref 26.0–34.0)
MCHC: 32.9 g/dL (ref 30.0–36.0)
MCV: 93.8 fL (ref 80.0–100.0)
Monocytes Absolute: 0.2 10*3/uL (ref 0.1–1.0)
Monocytes Relative: 7 %
Neutro Abs: 1.9 10*3/uL (ref 1.7–7.7)
Neutrophils Relative %: 55 %
Platelets: 180 10*3/uL (ref 150–400)
RBC: 2.72 MIL/uL — ABNORMAL LOW (ref 4.22–5.81)
RDW: 16.6 % — ABNORMAL HIGH (ref 11.5–15.5)
WBC: 3.5 10*3/uL — ABNORMAL LOW (ref 4.0–10.5)
nRBC: 0 % (ref 0.0–0.2)

## 2021-09-08 LAB — GLUCOSE, CAPILLARY
Glucose-Capillary: 69 mg/dL — ABNORMAL LOW (ref 70–99)
Glucose-Capillary: 73 mg/dL (ref 70–99)
Glucose-Capillary: 133 mg/dL — ABNORMAL HIGH (ref 70–99)
Glucose-Capillary: 114 mg/dL — ABNORMAL HIGH (ref 70–99)
Glucose-Capillary: 136 mg/dL — ABNORMAL HIGH (ref 70–99)

## 2021-09-08 NOTE — Progress Notes (Signed)
Inpatient Rehabilitation Care Coordinator ?Assessment and Plan ?Patient Details  ?Name: Daniel Bailey. ?MRN: [id number] ?Date of Birth: 1951/03/14 ? ?Today's Date: 09/08/2021 ? ?Hospital Problems: Principal Problem: ?  Acute encephalopathy ? ?Past Medical History:  ?Past Medical History:  ?Diagnosis Date  ? Anemia   ? Elevated PSA   ? Frequent urination   ? Glaucoma   ? History of COVID-19   ? Hyperglycemia   ? Hyperlipidemia   ? Hypertension   ? Sleep apnea   ? ?Past Surgical History:  ?Past Surgical History:  ?Procedure Laterality Date  ? COLONOSCOPY    ? PROSTATE BIOPSY N/A 11/20/2017  ? Procedure: BIOPSY TRANSRECTAL ULTRASONIC PROSTATE (TUBP);  Surgeon: Abbie Sons, MD;  Location: [facility] ORS;  Service: Urology;  Laterality: N/A;  ? ?Social History:  reports that he has never smoked. He has never used smokeless tobacco. He reports that he does not drink alcohol and does not use drugs. ? ?Family / Support Systems ?Marital Status: Single ?Spouse/Significant Other: N/A ?Children: No children ?Anticipated Caregiver: nephew [name] ?Ability/Limitations of Caregiver: None reported ?Caregiver Availability: 24/7 ?Family Dynamics: Patient's nephe [name] lives with him and provodes 24/7 care ? ?Social History ?Preferred language: English ?Religion: Non-Denominational ?Health Literacy - How often do you need to have someone help you when you read instructions, pamphlets, or other written material from your doctor or pharmacy?: Often ?Writes: Yes ?Employment Status: Disabled ?Date Retired/Disabled/Unemployed: Disabled ?Legal History/Current Legal Issues: N/A ?Guardian/Conservator: [name] ([email or web address])  ? ?Abuse/Neglect ?Abuse/Neglect Assessment Can Be Completed: Unable to assess, patient is non-responsive or altered mental status ?Physical Abuse: Denies ?Verbal Abuse: Denies ?Sexual Abuse: Denies ?Exploitation of patient/patient's resources: Denies ?Self-Neglect: Denies ? ?Patient response to: ?Social Isolation - How  often do you feel lonely or isolated from those around you?: Rarely ? ?Emotional Status ?Pt's affect, behavior and adjustment status: Pt in good spirits at time of visit ?Recent Psychosocial Issues: Denies ?Psychiatric History: Hx of IDD ?Substance Abuse History: Denies ? ?Patient / Family Perceptions, Expectations & Goals ?Pt/Family understanding of illness & functional limitations: SW will confirm ?Premorbid pt/family roles/activities: Independent ?Anticipated changes in roles/activities/participation: Assistance with ADLs/IADLs ? ?Community Resources ?Community Agencies: None ?Premorbid Home Care/DME Agencies: Other (Comment) ([facility]- Currently HHPT/OT, ordered HHRN,PT while on acute) ?Transportation available at discharge: Family ?Is the patient able to respond to transportation needs?: Yes ?In the past 12 months, has lack of transportation kept you from medical appointments or from getting medications?: No ?In the past 12 months, has lack of transportation kept you from meetings, work, or from getting things needed for daily living?: No ? ?Discharge Planning ?Living Arrangements: Other relatives ?Support Systems: Other relatives ?Type of Residence: Private residence ?Insurance Resources: Medicare ?Financial Resources: SSD ?Financial Screen Referred: No ?Living Expenses: Lives with family ?Money Management: Family ?Does the patient have any problems obtaining your medications?: No ?Home Management: Pt reports he helps with some housekeeping, and his nephew prepares all meals ?Patient/Family Preliminary Plans: No Changes ?Care Coordinator Barriers to Discharge: Decreased caregiver support, Lack of/limited family support ?Care Coordinator Anticipated Follow Up Needs: HH/OP ? ?Clinical Impression ?Pt is not a English as a second language teacher. Guardian is relative [name]. DME: RW.  ? ?SW left message for guardian- [name] to introduce self, explain role, discuss discharge process, and get insight for pt background. SW waiting  on follow-up.  ? ?Rana Snare ?09/08/2021, 10:47 AM ? ?  ?

## 2021-09-08 NOTE — Progress Notes (Signed)
?                                                       PROGRESS NOTE ? ? ?Subjective/Complaints: ?Patient just finished breakfast.  Had no issues with that this morning.  Tells me that he slept well.  Denies any pain or problems breathing. ? ?Review of Systems  ?Constitutional: Negative.   ?HENT: Negative.    ?Eyes: Negative.   ?Respiratory: Negative.    ?Cardiovascular: Negative.   ?Gastrointestinal: Negative.   ?Genitourinary: Negative.   ?Musculoskeletal: Negative.   ?Skin: Negative.   ?Neurological: Negative.   ?Psychiatric/Behavioral: Negative.     ? ? ?Objective: ?  ?No results found. ?Recent Labs  ?  09/07/21 ?0450 09/08/21 ?0459  ?WBC 4.4 3.5*  ?HGB 9.0* 8.4*  ?HCT 27.0* 25.5*  ?PLT 162 180  ? ?Recent Labs  ?  09/07/21 ?0450 09/08/21 ?0459  ?NA 141 142  ?K 4.1 3.9  ?CL 109 108  ?CO2 24 27  ?GLUCOSE 112* 85  ?BUN 13 17  ?CREATININE 1.31* 1.27*  ?CALCIUM 8.8* 9.1  ? ? ?Intake/Output Summary (Last 24 hours) at 09/08/2021 0903 ?Last data filed at 09/08/2021 9379 ?Gross per 24 hour  ?Intake 420 ml  ?Output 1000 ml  ?Net -580 ml  ?  ? ?  ? ?Physical Exam: ?Vital Signs ?Blood pressure 117/63, pulse (!) 49, temperature 97.6 ?F (36.4 ?C), temperature source Oral, resp. rate 17, height '5\' 6"'$  (1.676 m), weight 87.5 kg, SpO2 97 %. ?General: Alert and oriented x 3, No apparent distress ?HEENT: Head is normocephalic, atraumatic, PERRLA, EOMI, sclera anicteric, oral mucosa pink and moist, dentition intact, ext ear canals clear, appears to be edentulous ?Neck: Supple without JVD or lymphadenopathy ?Heart: Reg rate and rhythm. No murmurs rubs or gallops ?Chest: CTA bilaterally without wheezes, rales, or rhonchi; no distress ?Abdomen: Soft, non-tender, non-distended, bowel sounds positive. ?Extremities: No clubbing, cyanosis, or edema. Pulses are 2+ ?Psych: Pt's affect is appropriate. Pt is cooperative ?Skin: Clean and intact without signs of breakdown ?Neuro:   Patient is alert and oriented to self.  Could not tell me where he  is.  Told me he was from [place].  Speech is dysarthric.  Patient was attentive and can follow commands.  Moves all fours at 4 out of 5.  No focal sensory deficits. ?Musculoskeletal: Full ROM, No pain with AROM or PROM in the neck, trunk, or extremities. Posture appropriate   ? ? ?Assessment/Plan: ?1. Functional deficits which require 3+ hours per day of interdisciplinary therapy in a comprehensive inpatient rehab setting. ?Physiatrist is providing close team supervision and 24 hour management of active medical problems listed below. ?Physiatrist and rehab team continue to assess barriers to discharge/monitor patient progress toward functional and medical goals ? ?Care Tool: ? ?Bathing ?   ?   ?   ?  ?  ?Bathing assist   ?  ?  ?Upper Body Dressing/Undressing ?Upper body dressing   ?  ?   ?Upper body assist   ?   ?Lower Body Dressing/Undressing ?Lower body dressing ? ? ?   ?  ? ?  ? ?Lower body assist   ?   ? ?Toileting ?Toileting    ?Toileting assist   ?  ?  ?Transfers ?Chair/bed transfer ? ?Transfers assist ?   ? ?  ?  ?  ?  Locomotion ?Ambulation ? ? ?Ambulation assist ? ?   ? ?  ?  ?   ? ?Walk 10 feet activity ? ? ?Assist ?   ? ?  ?   ? ?Walk 50 feet activity ? ? ?Assist   ? ?  ?   ? ? ?Walk 150 feet activity ? ? ?Assist   ? ?  ?  ?  ? ?Walk 10 feet on uneven surface  ?activity ? ? ?Assist   ? ? ?  ?   ? ?Wheelchair ? ? ? ? ?Assist   ?  ?  ? ?  ?   ? ? ?Wheelchair 50 feet with 2 turns activity ? ? ? ?Assist ? ?  ?  ? ? ?   ? ?Wheelchair 150 feet activity  ? ? ? ?Assist ?   ? ? ?   ? ?Blood pressure 117/63, pulse (!) 49, temperature 97.6 ?F (36.4 ?C), temperature source Oral, resp. rate 17, height '5\' 6"'$  (1.676 m), weight 87.5 kg, SpO2 97 %. ?Medical Problem List and Plan: ?1. Functional deficits secondary to acute encephalopathy with baseline intellectual disability ?            -patient may shower ?            -ELOS/Goals: 12-14 days ?            -Patient is beginning CIR therapies today including PT, OT, and SLP   ?2.  Antithrombotics: ?-DVT/anticoagulation:  Pharmaceutical: Lovenox ?            -antiplatelet therapy: N/A ?3. Pain Management: Tylenol as needed ?4. Mood: Provide emotional support ?            -antipsychotic agents: N/A ?5. Neuropsych: This patient is not capable of making decisions on his own behalf. ?6. Skin/Wound Care: Routine skin  checks ?7. Fluids/Electrolytes/Nutrition:  ? -appetite seems reasonable so far ?8.  Recurrent aspiration pneumonia.  Antibiotic therapy completed 09/06/2021.  Currently on dysphagia #1 nectar thick liquid diet.  Follow-up speech therapy ? -tolerating diet well ?9.  Hypotension.  ProAmatine 5 mg 3 times daily.  Monitor with increased mobility. BP appears well controlled today. ?10.  Diabetes mellitus.  Latest hemoglobin A1c 6.4.  SSI.  Patient on Glucophage 1000 mg daily prior to admission.  Resume as needed.  ?-CBGs tightly controlled at present ?11.  Normocytic anemia.  Patient with hematology work-up in the past.  Follow-up CBC ? -hgb steadily dropping over last week. ? -recheck stool for ob ? -had one + test in March ?12.  Hyperlipidemia.  Patient on Lopid/Lipitor prior to admission.  Currently on hold due to dysphagia ?13. Recurrent hypothermia with unclear cause ?            -Monitor, continue environmental warming as needed ?14. AKI on CKD stage IIIa. improved continue to monitor ? -Cr stable/in range today ?15. GERD. Continue PPI ?16. Bradycardia. Seen by cardiology. Continue to monitor ? -Heart rate in 40s.  Patient asymptomatic ? ?LOS: ?1 days ?A FACE TO FACE EVALUATION WAS PERFORMED ? ?Meredith Staggers ?09/08/2021, 9:03 AM  ? ?  ?

## 2021-09-08 NOTE — Evaluation (Addendum)
Speech Language Pathology Assessment and Plan ? ?Patient Details  ?Name: Daniel Bailey. ?MRN: [id number] ?Date of Birth: [birth year] ? ?SLP Diagnosis: Dysphagia  ?Rehab Potential: Fair ?ELOS: 2 weeks  ? ? ?Today's Date: 09/08/2021 ?SLP Individual Time: 5621-3086 ?SLP Individual Time Calculation (min): 55 min ? ? ?Hospital Problem: Principal Problem: ?  Acute encephalopathy ? ?Past Medical History:  ?Past Medical History:  ?Diagnosis Date  ? Anemia   ? Elevated PSA   ? Frequent urination   ? Glaucoma   ? History of COVID-19   ? Hyperglycemia   ? Hyperlipidemia   ? Hypertension   ? Sleep apnea   ? ?Past Surgical History:  ?Past Surgical History:  ?Procedure Laterality Date  ? COLONOSCOPY    ? PROSTATE BIOPSY N/A 11/20/2017  ? Procedure: BIOPSY TRANSRECTAL ULTRASONIC PROSTATE (TUBP);  Surgeon: Abbie Sons, MD;  Location: [facility] ORS;  Service: Urology;  Laterality: N/A;  ? ? ?Assessment / Plan / Recommendation ?Clinical Impression Patient is a 71 year old right-handed male with history of intellectual disability, sinus bradycardia, bifascicular block, hypotension maintained on ProAmatine.  Prior GIB, diabetes mellitus, anemia with hematology work-up in the past, hyperlipidemia, recent recurrent aspiration pneumonia admission. Per chart review patient lives with a cousin and good family support with 24-hour assistance as needed.  1 level home with ramped entrance.  Patient able to assist with chores around the house and independent with ADLs.  Presented 09/02/2021 with cough over the past few days without fever and reports of lethargy with some bizarre behavior.  CT/MRI of the brain showed no acute process. Chest x-ray retrocardiac infiltrate as seen on prior comparisons.  EEG negative for seizure but did show encephalopathy.  He was placed on broad-spectrum antibiotics with ceftriaxone/azithromycin x5 days completed 09/06/2021 and leukocytosis improved to 6.7.  Patient's mental status continues to improve, suspect acute  encephalopathy that is multifactorial. He is currently maintained on a dys.1 diet with nectar thick liquids.  PEG considered but was declined. Palliative care consulted to establish goals of care.  Therapy evaluations completed with recommendations for a comprehensive rehab program. Patient admitted 09/07/21. ? ?Patient has a history of cognitive deficits at baseline but family was not present to confirm baseline level of functioning. Patient was awake and alert and demonstrated appropriate attention to functional tasks throughout session. Patient was independently oriented to person, place (hospital), and situation (infection). Patient demonstrated general recall of events during hospitalization but demonstrated decreased awareness regarding severity of swallowing deficits. Patient appeared to demonstrate a hoarse vocal quality and low vocal intensity impacting his overall intelligibility at times. Suspect patient is close to his baseline level of cognitive functioning but need to discuss with caregivers. Throughout session, patient with decreased management of secretions requiring cues to self-monitor and correct. SLP set up a suction to assist with patient requiring frequent reminders to use. Patient had an MBS on 5/2 which identified a moderate-severe dysphagia with aspiration on all consistencies. Patient's caregivers declined alternative means of nutrition, therefore, patient was recommended a diet of Dys. 1 textures with nectar-thick liquids with knowledge of the high risk of aspiration and possible reoccurrence of aspiration PNA.  Patient consumed a snack of Dys. 1 textures with nectar-thick liquids via cup. Patient demonstrated prolonged oral transit with use of multiple swallows due to suspected residuals. Intermittent coughing and throat clearing noted across trials with Mod-Max A multimodal cues needed for use of swallowing compensatory strategies. Recommend patient continue current diet with full  supervision to maximize safety with PO  intake. Patient would benefit from skilled SLP intervention to maximize his swallowing function prior to discharge. ? ?Of note, SLP called the patient's legal guardian, kelly to provide ongoing education regarding patient's current swallowing function. [name] reported that she did not have prior knowledge of the patient's high aspiration risk despite meeting with palliative care to establish goals of care. SLP provided education regarding current swallowing function, diet recommendations, aspiration risk with possibility of reoccurring aspiration PNA, and compensatory strategies. [name] verbalized understanding of all information and reported she wanted patient to remain on a PO diet at this time. [name], NP with palliative care was notified regarding conversation with patient's legal guardian and reported that the patient's aspiration risk was discussed during meeting and she plans to f/u with [name].  ?  ?Skilled Therapeutic Interventions          Administered a cognitive-linguistic evaluation and BSE, please see above for details.   ?SLP Assessment ? Patient will need skilled Speech Lanaguage Pathology Services during CIR admission  ?  ?Recommendations ? SLP Diet Recommendations: Dysphagia 1 (Puree);Nectar ?Liquid Administration via: Cup ?Medication Administration: Crushed with puree ?Supervision: Patient able to self feed ?Compensations: Slow rate;Small sips/bites;Clear throat intermittently;Follow solids with liquid;Minimize environmental distractions;Multiple dry swallows after each bite/sip ?Postural Changes and/or Swallow Maneuvers: Seated upright 90 degrees;Upright 30-60 min after meal ?Oral Care Recommendations: Oral care QID ?Patient destination: Home ?Follow up Recommendations: Home Health SLP;24 hour supervision/assistance ?Equipment Recommended: To be determined  ?  ?SLP Frequency 3 to 5 out of 7 days   ?SLP Duration ? ?SLP Intensity ? ?SLP  Treatment/Interventions 2 weeks ? ?Minumum of 1-2 x/day, 30 to 90 minutes ? ?Dysphagia/aspiration precaution training;Environmental controls;Cueing hierarchy;Functional tasks;Patient/family education;Therapeutic Activities   ? ?Pain ?No/Denies Pain  ? ?Prior Functioning ?Type of Home: Mobile home ? Lives With: Family ?Available Help at Discharge: Family;Available 24 hours/day ? ?SLP Evaluation ?Cognition ?Overall Cognitive Status: History of cognitive impairments - at baseline ?Arousal/Alertness: Awake/alert ?Orientation Level: Oriented to person;Oriented to place;Oriented to situation;Disoriented to time ?Attention: Sustained;Divided ?Sustained Attention: Appears intact ?Divided Attention: Impaired ?Memory: Impaired ?Memory Impairment: Decreased recall of new information;Decreased short term memory ?Decreased Short Term Memory: Verbal complex;Functional complex ?Awareness: Impaired ?Awareness Impairment: Emergent impairment ?Problem Solving: Impaired ?Problem Solving Impairment: Functional basic ?Executive Function: Reasoning;Sequencing;Organizing;Decision Making;Initiating ?Reasoning: Impaired ?Reasoning Impairment: Verbal complex;Functional complex ?Sequencing: Impaired ?Sequencing Impairment: Verbal complex;Functional complex ?Organizing: Impaired ?Organizing Impairment: Verbal complex;Functional complex ?Decision Making: Impaired ?Decision Making Impairment: Verbal complex;Functional complex ?Initiating: Impaired ?Initiating Impairment: Verbal complex;Functional complex ?Safety/Judgment: Appears intact  ?Comprehension ?Auditory Comprehension ?Overall Auditory Comprehension: Appears within functional limits for tasks assessed ?Expression ?Expression ?Primary Mode of Expression: Verbal ?Verbal Expression ?Overall Verbal Expression: Appears within functional limits for tasks assessed ?Written Expression ?Dominant Hand: Left ?Oral Motor ?Oral Motor/Sensory Function ?Overall Oral Motor/Sensory Function: Generalized  oral weakness ?Motor Speech ?Overall Motor Speech: Impaired at baseline ?Respiration: Within functional limits ?Phonation: Hoarse ?Resonance: Within functional limits ?Articulation: Impaired ?Level of Impairment: Sentence ?Intelligibil

## 2021-09-08 NOTE — Evaluation (Signed)
Occupational Therapy Assessment and Plan ? ?Patient Details  ?Name: Daniel Bailey. ?MRN: [id number] ?Date of Birth: 11/17/1950 ? ?OT Diagnosis: cognitive deficits, muscle weakness (generalized), and other lymphedema ?Rehab Potential: Rehab Potential (ACUTE ONLY): Good ?ELOS: ~ 14 days  ? ?Today's Date: 09/08/2021 ?OT Individual Time: 1300-1400 ?OT Individual Time Calculation (min): 60 min    ? ?Hospital Problem: Principal Problem: ?  Acute encephalopathy ? ? ?Past Medical History:  ?Past Medical History:  ?Diagnosis Date  ? Anemia   ? Elevated PSA   ? Frequent urination   ? Glaucoma   ? History of COVID-19   ? Hyperglycemia   ? Hyperlipidemia   ? Hypertension   ? Sleep apnea   ? ?Past Surgical History:  ?Past Surgical History:  ?Procedure Laterality Date  ? COLONOSCOPY    ? PROSTATE BIOPSY N/A 11/20/2017  ? Procedure: BIOPSY TRANSRECTAL ULTRASONIC PROSTATE (TUBP);  Surgeon: Abbie Sons, MD;  Location: [facility] ORS;  Service: Urology;  Laterality: N/A;  ? ? ?Assessment & Plan ?Clinical Impression: Patient is a 71 y.o. year old male  with medical history of intellectual disability, T2DM, HLD, anemia, recent aspiration pneumonia admission who presented with worsening alteration of mental status in the setting of recent cough. History given by guardian, pt's cousin, as pt is somnolent from receiving IM geodon earlier for agitation. He developed a cough a few days prior but had no fever or evidence of dyspnea. Then yesterday the guardian's son, with whom he lives, reported that the patient, who is normally quite verbose was not speaking very much, then was unable to even get words out to answer questions, was at times answering questions incorrectly. He was always responsive, but had bizarre behavior he doesn't usually exhibit, like sitting without bottoms on. When told to put something on, he pulls up underwear, then pulls up pants part way, then immediately starts taking it all off. He was shaking in his arms at some point  but when told to stop, he was able to do so. Due to progressive changes, EMS was activated, confirmed euglycemia, and brought him to ED. He was afebrile with leukocytosis, retrocardiac infiltrate on CXR, and mild AKI. EDP reports he had stuttering speech on arrival, had difficulty saying what he seemed to want to say but had nonfocal exam. CT head normal for age. The patient became more agitated ultimately requiring IM geodon. Admission requested for pneumonia complicated by AMS.  Patient transferred to CIR on 09/07/2021 .   ? ?Patient currently requires mod with basic self-care skills secondary to muscle weakness, decreased cardiorespiratoy endurance, decreased coordination and decreased motor planning, decreased visual acuity, and decreased initiation, decreased attention, decreased awareness, decreased problem solving, decreased safety awareness, decreased memory, and delayed processing.  Prior to hospitalization, patient could complete basic ADL's and mobility with modified independent . ? ?Patient will benefit from skilled intervention to decrease level of assist with basic self-care skills and increase independence with basic self-care skills prior to discharge home with care partner.  Anticipate patient will require 24 hour supervision and follow up home health. ? ?OT - End of Session ?Activity Tolerance: Tolerates 30+ min activity with multiple rests ?Endurance Deficit: Yes ?Endurance Deficit Description: pt reported fatigue with activity ?OT Assessment ?Rehab Potential (ACUTE ONLY): Good ?OT Patient demonstrates impairments in the following area(s): Balance;Motor;Nutrition;Cognition;Vision;Edema;Endurance;Safety ?OT Basic ADL's Functional Problem(s): Grooming;Bathing;Dressing;Toileting ?OT Transfers Functional Problem(s): Toilet;Tub/Shower ?OT Plan ?OT Intensity: Minimum of 1-2 x/day, 45 to 90 minutes ?OT Frequency: 5 out of 7 days ?  OT Duration/Estimated Length of Stay: ~ 14 days ?OT Treatment/Interventions:  Balance/vestibular training;Disease mangement/prevention;Neuromuscular re-education;Patient/family education;Self Care/advanced ADL retraining;Therapeutic Exercise;UE/LE Coordination activities;Cognitive remediation/compensation;Discharge planning;DME/adaptive equipment instruction;Functional mobility training;Therapeutic Activities;UE/LE Strength taining/ROM;Visual/perceptual remediation/compensation ?OT Self Feeding Anticipated Outcome(s): n/a ?OT Basic Self-Care Anticipated Outcome(s): Supervision overall ?OT Toileting Anticipated Outcome(s): Supervision ?OT Bathroom Transfers Anticipated Outcome(s): Supervision ?OT Recommendation ?Patient destination: Home ?Follow Up Recommendations: Home health OT;24 hour supervision/assistance ?Equipment Recommended: To be determined ? ? ?OT Evaluation ?Precautions/Restrictions  ?Precautions ?Precautions: Fall ?Restrictions ?Weight Bearing Restrictions: No ?General ?Chart Reviewed: Yes ?Response to Previous Treatment: Patient with no complaints from previous session ?Family/Caregiver Present: No ?Vital Signs ?Therapy Vitals ?Pulse Rate: (!) 43 ?Resp: 16 ?BP: 117/70 ?Patient Position (if appropriate): Sitting ?Oxygen Therapy ?SpO2: 100 % ?O2 Device: Room Air ?Pain ?  ?Home Living/Prior Functioning ?Home Living ?Living Arrangements: Other relatives ?Available Help at Discharge: Family, Available 24 hours/day ?Type of Home: Mobile home ?Home Access: Ramped entrance ?Home Layout: One level ?Bathroom Shower/Tub: Tub/shower unit ?Bathroom Toilet: Standard ?Bathroom Accessibility: Yes ?Additional Comments: home living info and prior function info gathered from chart ? Lives With: Family ?IADL History ?Homemaking Responsibilities: No ?Current License: No ?Mode of Transportation: Family ?Prior Function ?Level of Independence: Independent with basic ADLs, Independent with transfers, Independent with homemaking with ambulation, Independent with gait ? Able to Take Stairs?: Yes ?Driving:  No ?Vision ?Baseline Vision/History: 1 Wears glasses ?Ability to See in Adequate Light: 0 Adequate ?Patient Visual Report: No change from baseline ?Vision Assessment?: Vision impaired- to be further tested in functional context ?Additional Comments: Pt reported some blurred vision but reports not new and does not wear his glasses, does not read but able to see wall clock and items within reach, cues for identifying colors. ?Perception  ?Perception: Within Functional Limits ?Praxis ?Praxis: Intact ?Cognition ?Cognition ?Overall Cognitive Status: History of cognitive impairments - at baseline ?Arousal/Alertness: Awake/alert ?Orientation Level: Person;Place ?Memory: Impaired ?Memory Impairment: Decreased recall of new information;Decreased short term memory ?Decreased Short Term Memory: Verbal complex;Functional complex ?Attention: Sustained;Divided ?Sustained Attention: Appears intact ?Divided Attention: Impaired ?Awareness: Impaired ?Awareness Impairment: Emergent impairment ?Problem Solving: Impaired ?Problem Solving Impairment: Functional basic ?Executive Function: Reasoning;Sequencing;Organizing;Decision Making;Initiating ?Reasoning: Impaired ?Reasoning Impairment: Verbal complex;Functional complex ?Sequencing: Impaired ?Sequencing Impairment: Verbal complex;Functional complex ?Organizing: Impaired ?Organizing Impairment: Verbal complex;Functional complex ?Decision Making: Impaired ?Decision Making Impairment: Verbal complex;Functional complex ?Initiating: Impaired ?Initiating Impairment: Verbal complex;Functional complex ?Safety/Judgment: Appears intact ?Brief Interview for Mental Status (BIMS) ?Repetition of Three Words (First Attempt): 3 ?Temporal Orientation: Year: Missed by more than 5 years ?Temporal Orientation: Month: Missed by more than 1 month ?Temporal Orientation: Day: Incorrect ?Recall: "Sock": Nonsensical ?Recall: "Blue": Nonsensical ?Recall: "Bed": Nonsensical ?BIMS Summary Score:  3 ?Sensation ?Sensation ?Light Touch: Appears Intact ?Proprioception: Appears Intact ?Coordination ?Gross Motor Movements are Fluid and Coordinated: No ?Fine Motor Movements are Fluid and Coordinated: No ?Coordination

## 2021-09-08 NOTE — Plan of Care (Signed)
?  Problem: RH Balance ?Goal: LTG Patient will maintain dynamic standing with ADLs (OT) ?Description: LTG:  Patient will maintain dynamic standing balance with assist during activities of daily living (OT)  ?Flowsheets (Taken 09/08/2021 1603) ?LTG: Pt will maintain dynamic standing balance during ADLs with: Supervision/Verbal cueing ?  ?Problem: RH Grooming ?Goal: LTG Patient will perform grooming w/assist,cues/equip (OT) ?Description: LTG: Patient will perform grooming with assist, with/without cues using equipment (OT) ?Flowsheets (Taken 09/08/2021 1603) ?LTG: Pt will perform grooming with assistance level of: Supervision/Verbal cueing ?  ?Problem: RH Balance ?Goal: LTG Patient will maintain dynamic standing with ADLs (OT) ?Description: LTG:  Patient will maintain dynamic standing balance with assist during activities of daily living (OT)  ?Flowsheets (Taken 09/08/2021 1603) ?LTG: Pt will maintain dynamic standing balance during ADLs with: Supervision/Verbal cueing ?  ?Problem: RH Grooming ?Goal: LTG Patient will perform grooming w/assist,cues/equip (OT) ?Description: LTG: Patient will perform grooming with assist, with/without cues using equipment (OT) ?Flowsheets (Taken 09/08/2021 1603) ?LTG: Pt will perform grooming with assistance level of: Supervision/Verbal cueing ?  ?Problem: RH Bathing ?Goal: LTG Patient will bathe all body parts with assist levels (OT) ?Description: LTG: Patient will bathe all body parts with assist levels (OT) ?Flowsheets (Taken 09/08/2021 1603) ?LTG: Pt will perform bathing with assistance level/cueing: Supervision/Verbal cueing ?  ?Problem: RH Dressing ?Goal: LTG Patient will perform upper body dressing (OT) ?Description: LTG Patient will perform upper body dressing with assist, with/without cues (OT). ?Flowsheets (Taken 09/08/2021 1603) ?LTG: Pt will perform upper body dressing with assistance level of: Set up assist ?Goal: LTG Patient will perform lower body dressing w/assist (OT) ?Description:  LTG: Patient will perform lower body dressing with assist, with/without cues in positioning using equipment (OT) ?Flowsheets (Taken 09/08/2021 1603) ?LTG: Pt will perform lower body dressing with assistance level of: Supervision/Verbal cueing ?  ?Problem: RH Toileting ?Goal: LTG Patient will perform toileting task (3/3 steps) with assistance level (OT) ?Description: LTG: Patient will perform toileting task (3/3 steps) with assistance level (OT)  ?Flowsheets (Taken 09/08/2021 1603) ?LTG: Pt will perform toileting task (3/3 steps) with assistance level: Supervision/Verbal cueing ?  ?Problem: RH Functional Use of Upper Extremity ?Goal: LTG Patient will use RT/LT upper extremity as a (OT) ?Description: LTG: Patient will use right/left upper extremity as a stabilizer/gross assist/diminished/nondominant/dominant level with assist, with/without cues during functional activity (OT) ?Flowsheets (Taken 09/08/2021 1603) ?LTG: Use of upper extremity in functional activities: ? LUE as dominant level ? RUE as diminished level ?LTG: Pt will use upper extremity in functional activity with assistance level of: Set up assist ?  ?

## 2021-09-08 NOTE — Evaluation (Signed)
Physical Therapy Assessment and Plan ? ?Patient Details  ?Name: Daniel Bailey. ?MRN: [id number] ?Date of Birth: 04/12/1951 ? ?PT Diagnosis: Abnormal posture, Abnormality of gait, Cognitive deficits, Coordination disorder, Difficulty walking, Edema, Impaired cognition, and Muscle weakness ?Rehab Potential: Good ?ELOS: 14 days  ? ?Today's Date: 09/08/2021 ?PT Individual Time: 6712-4580 ?PT Individual Time Calculation (min): 54 min   ? ?Hospital Problem: Principal Problem: ?  Acute encephalopathy ? ? ?Past Medical History:  ?Past Medical History:  ?Diagnosis Date  ? Anemia   ? Elevated PSA   ? Frequent urination   ? Glaucoma   ? History of COVID-19   ? Hyperglycemia   ? Hyperlipidemia   ? Hypertension   ? Sleep apnea   ? ?Past Surgical History:  ?Past Surgical History:  ?Procedure Laterality Date  ? COLONOSCOPY    ? PROSTATE BIOPSY N/A 11/20/2017  ? Procedure: BIOPSY TRANSRECTAL ULTRASONIC PROSTATE (TUBP);  Surgeon: Abbie Sons, MD;  Location: [facility] ORS;  Service: Urology;  Laterality: N/A;  ? ? ?Assessment & Plan ?Clinical Impression: Patient is a 71 y.o. 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  Prior GIB, diabetes mellitus, anemia with hematology work-up in the past, hyperlipidemia, recent recurrent aspiration pneumonia admission.  Per chart review patient lives with a cousin and good family support with 24-hour assistance as needed.  1 level home with ramped entrance.  Patient able to assist with chores around the house and independent with ADLs.  Presented 09/02/2021 with cough over the past few days without fever and reports of lethargy with some bizarre behavior.  CT/MRI of the brain showed no acute process.  Chest x-ray retrocardiac infiltrate as seen on prior comparisons.  Urine culture less than 10,000 insignificant growth, WBC 13,400, hemoglobin 9.7, creatinine 1.48, blood cultures no growth to date, lactic acid 0.8,  ammonia level 18, procalcitonin level 0.28.  Renal ultrasound showed no evidence of hydronephrosis or renal mass.  EEG negative for seizure but did show encephalopathy.  He was placed on broad-spectrum antibiotics with ceftriaxone/azithromycin x5 days completed 09/06/2021 and leukocytosis improved 6.7.  Patient's mental status continues to improve suspect acute encephalopathy multifactorial patient now close to baseline.  He is currently maintained on a dysphagia #1 nectar thick liquid diet.  PEG considered but was declined. Palliative care consulted to establish goals of care.  He is maintained on Lovenox for DVT prophylaxis.  Therapy evaluations completed due to patient decreased functional mobility altered mental status was admitted for a comprehensive rehab program. ? ?Patient currently requires mod with mobility secondary to muscle weakness, decreased cardiorespiratoy endurance, impaired timing and sequencing, decreased coordination, and decreased motor planning, decreased problem solving, and decreased standing balance, decreased postural control, and decreased balance strategies.  Prior to hospitalization, patient was independent  with mobility and lived with Family (cousin) in a Mobile home home.  Home access is  Ramped entrance. ? ?Patient will benefit from skilled PT intervention to maximize safe functional mobility, minimize fall risk, and decrease caregiver burden for planned discharge home with 24 hour supervision.  Anticipate patient will benefit from follow up [place] at discharge. ? ?PT - End of Session ?Activity Tolerance: Tolerates 30+ min activity with multiple rests ?Endurance Deficit: Yes ?Endurance Deficit Description: pt reported fatigue with activity ?PT Assessment ?Rehab Potential (ACUTE/IP ONLY): Good ?PT Barriers to Discharge: Decreased caregiver support ?PT Barriers to Discharge Comments: cognitive deficits, impaired balance/coordination ?PT Patient demonstrates impairments in the following  area(s): Balance;Edema;Endurance;Motor;Nutrition;Safety;Pain ?  PT Transfers Functional Problem(s): Bed Mobility;Bed to Chair;Car;Furniture ?PT Locomotion Functional Problem(s): Ambulation;Wheelchair Mobility;Stairs ?PT Plan ?PT Intensity: Minimum of 1-2 x/day ,45 to 90 minutes ?PT Frequency: 5 out of 7 days ?PT Duration Estimated Length of Stay: 14 days ?PT Treatment/Interventions: Ambulation/gait training;Discharge planning;Functional mobility training;Therapeutic Activities;Visual/perceptual remediation/compensation;Balance/vestibular training;Disease management/prevention;Neuromuscular re-education;Skin care/wound management;Therapeutic Exercise;Wheelchair propulsion/positioning;Cognitive remediation/compensation;DME/adaptive equipment instruction;Pain management;Splinting/orthotics;UE/LE Strength taining/ROM;Community reintegration;Patient/family education;Stair training;UE/LE Coordination activities;Psychosocial support ?PT Transfers Anticipated Outcome(s): supervision with LRAD ?PT Locomotion Anticipated Outcome(s): supervision with LRAD ?PT Recommendation ?Follow Up Recommendations: Home health PT ?Patient destination: Home ?Equipment Recommended: To be determined ?Equipment Details: has RW ? ?PT Evaluation ?Precautions/Restrictions ?Precautions ?Precautions: Fall ?Restrictions ?Weight Bearing Restrictions: No ?Pain Interference ?Pain Interference ?Pain Effect on Sleep: 0. Does not apply - I have not had any pain or hurting in the past 5 days ?Pain Interference with Therapy Activities: 0. Does not apply - I have not received rehabilitationtherapy in the past 5 days ?Pain Interference with Day-to-Day Activities: 1. Rarely or not at all ?Home Living/Prior Functioning ?Home Living ?Available Help at Discharge: Family;Available 24 hours/day ?Type of Home: Mobile home ?Home Access: Ramped entrance ?Home Layout: One level ?Bathroom Shower/Tub: Tub/shower unit ?Bathroom Toilet: Standard ?Bathroom Accessibility: Yes ?  Lives With: Family (cousin) ?Prior Function ?Level of Independence: Independent with basic ADLs;Independent with transfers;Independent with homemaking with ambulation;Independent with gait ? Able to Take Stairs?: Yes ?Driving: No ?Vision/Perception  ?Vision - History ?Ability to See in Adequate Light: 0 Adequate ?Perception ?Perception: Within Functional Limits  ?Cognition ?Overall Cognitive Status: No family/caregiver present to determine baseline cognitive functioning ?Arousal/Alertness: Awake/alert ?Orientation Level: Oriented to person;Disoriented to time;Disoriented to place;Disoriented to situation ?Memory: Impaired ?Awareness: Appears intact ?Problem Solving: Impaired ?Safety/Judgment: Appears intact ?Sensation ?Sensation ?Light Touch: Appears Intact ?Proprioception: Appears Intact ?Additional Comments: Pt with difficulty with word finding and unable to specify where on legs therapist was touching ?Coordination ?Gross Motor Movements are Fluid and Coordinated: No ?Fine Motor Movements are Fluid and Coordinated: No ?Coordination and Movement Description: grossly uncoordinated due to generalized weakness/deconditioning, impaired motor planning/sequencing, and decreased balance/postural control ?Finger Nose Finger Test: dysmetria bilaterally and required cues to follow instructions ?Heel Shin Test: slow and decreased ROM bilaterally ?Motor  ?Motor ?Motor: Abnormal postural alignment and control ?Motor - Skilled Clinical Observations: grossly uncoordinated due to generalized weakness/deconditioning, impaired motor planning/sequencing, and decreased balance/postural control  ?Trunk/Postural Assessment  ?Cervical Assessment ?Cervical Assessment: Exceptions to [facility] (forward head) ?Thoracic Assessment ?Thoracic Assessment: Exceptions to [facility] (rounded shoulders) ?Lumbar Assessment ?Lumbar Assessment: Exceptions to [facility] (posterior pelvic tilt) ?Postural Control ?Postural Control: Deficits on evaluation ?Trunk Control:  inital posterior lean when standing  ?Balance ?Balance ?Balance Assessed: Yes ?Static Sitting Balance ?Static Sitting - Balance Support: Feet supported;Bilateral upper extremity supported ?Static Sitting - Leve

## 2021-09-08 NOTE — Progress Notes (Signed)
Inpatient Rehabilitation  Patient information reviewed and entered into eRehab system by Jsaon Yoo M. Taziah Difatta, M.A., CCC/SLP, PPS Coordinator.  Information including medical coding, functional ability and quality indicators will be reviewed and updated through discharge.    

## 2021-09-09 DIAGNOSIS — E119 Type 2 diabetes mellitus without complications: Secondary | ICD-10-CM | POA: Diagnosis not present

## 2021-09-09 DIAGNOSIS — D649 Anemia, unspecified: Secondary | ICD-10-CM | POA: Diagnosis not present

## 2021-09-09 DIAGNOSIS — G9340 Encephalopathy, unspecified: Secondary | ICD-10-CM | POA: Diagnosis not present

## 2021-09-09 DIAGNOSIS — N1831 Chronic kidney disease, stage 3a: Secondary | ICD-10-CM | POA: Diagnosis not present

## 2021-09-09 LAB — GLUCOSE, CAPILLARY
Glucose-Capillary: 77 mg/dL (ref 70–99)
Glucose-Capillary: 118 mg/dL — ABNORMAL HIGH (ref 70–99)
Glucose-Capillary: 93 mg/dL (ref 70–99)
Glucose-Capillary: 132 mg/dL — ABNORMAL HIGH (ref 70–99)

## 2021-09-09 NOTE — Care Management (Signed)
Inpatient Rehabilitation Center ?Individual Statement of Services ? ?Patient Name:  Daniel Bailey.  ?Date:  09/09/2021 ? ?Welcome to the [facility].  Our goal is to provide you with an individualized program based on your diagnosis and situation, designed to meet your specific needs.  With this comprehensive rehabilitation program, you will be expected to participate in at least 3 hours of rehabilitation therapies Monday-Friday, with modified therapy programming on the weekends. ? ?Your rehabilitation program will include the following services:  Physical Therapy (PT), Occupational Therapy (OT), Speech Therapy (ST), 24 hour per day rehabilitation nursing, Therapeutic Recreaction (TR), Psychology, Neuropsychology, Care Coordinator, Rehabilitation Medicine, Nutrition Services, Pharmacy Services, and Other ? ?Weekly team conferences will be held on Tuesdays to discuss your progress.  Your Inpatient Rehabilitation Care Coordinator will talk with you frequently to get your input and to update you on team discussions.  Team conferences with you and your family in attendance may also be held. ? ?Expected length of stay: 14 days ? ?Overall anticipated outcome: Supervision ? ?Depending on your progress and recovery, your program may change. Your Inpatient Rehabilitation Care Coordinator will coordinate services and will keep you informed of any changes. Your Inpatient Rehabilitation Care Coordinator's name and contact numbers are listed  below. ? ?The following services may also be recommended but are not provided by the [facility]:  ?Driving Evaluations ?Home Health Rehabiltiation Services ?Outpatient Rehabilitation Services ?Vocational Rehabilitation ?  ?Arrangements will be made to provide these services after discharge if needed.  Arrangements include referral to agencies that provide these services. ? ?Your insurance has been verified to be:  Medicare A/B ? ?Your primary doctor  is:  [facility] ? ?Pertinent information will be shared with your doctor and your insurance company. ? ?Inpatient Rehabilitation Care Coordinator:  Cathleen Corti [phone] or (C) [phone] ? ?Information discussed with and copy given to patient by: Rana Snare, 09/09/2021, 9:03 AM    ?

## 2021-09-09 NOTE — Progress Notes (Signed)
Speech Language Pathology Daily Session Note ? ?Patient Details  ?Name: Daniel Bailey. ?MRN: [id number] ?Date of Birth: [birth year] ? ?Today's Date: 09/09/2021 ?SLP Individual Time: 5456-2563 ?SLP Individual Time Calculation (min): 40 min ? ?Short Term Goals: ?Week 1: SLP Short Term Goal 1 (Week 1): Patient will consume current diet with Mod verbal cues for use of swallowing compensatory strategies. ?SLP Short Term Goal 2 (Week 1): Patient will self-monitor and correct wet vocal quality with a throat clear and re-swallow with Mod verbal cues. ?SLP Short Term Goal 3 (Week 1): Patient will recall swallowing compensatory strategies with 75% accuracy with Mod verbal cues. ? ?Skilled Therapeutic Interventions: Skilled treatment session focused on dysphagia goals. SLP facilitated session by providing nectar-thick liquids per his request. Patient recalled 2/4 swallowing compensatory strategies independently with SLP creating a visual aid to maximize recall of clear his throat and re-swallowing after bites/sips. Patient consumed 4 oz of nectar-thick liquids with prolonged oral transit, use of multiple and swallows and minimal overt s/s of aspiration with Min-Mod verbal cues needed for use of swallowing compensatory strategies. Recommend patient continue current diet with full supervision. Patient left upright in recliner with alarm on and all needs within reach. Continue with current plan of care.  ?   ? ?Pain ?No/Denies Pain  ? ?Therapy/Group: Individual Therapy ? ?Raymona Boss ?09/09/2021, 3:17 PM ?

## 2021-09-09 NOTE — Progress Notes (Signed)
Patient slept well last night. Denies any pain. Bradycardia without symptoms otherwise stable.Wet diaper X1 with small urine output. Bladder scanned this am, and intermittent urethral catheterization performed under sterile condition. 936ms of clear urine emptied. Made comfortable in bed. Safety ensured at all times. ?

## 2021-09-09 NOTE — Progress Notes (Signed)
Occupational Therapy Session Note ? ?Patient Details  ?Name: Daniel Bailey. ?MRN: [id number] ?Date of Birth: 1950/08/12 ? ?Session 1 ?Today's Date: 09/09/2021 ?OT Individual Time: 0263-7858 ?OT Individual Time Calculation (min): 70 min  ? ? ? ?Session 2 ?Today's Date: 09/09/2021 ?OT Individual Time: 8502-7741 ?OT Individual Time Calculation (min): 54 min  ? ?Short Term Goals: ?Week 1:  OT Short Term Goal 1 (Week 1): Pt will ambulate with min A to obtain clothing items with RW ?OT Short Term Goal 2 (Week 1): Pt will be able to complete LB clothing with min A including socks and shoes ?OT Short Term Goal 3 (Week 1): Pt will demonstrate improved functional coordination via edema control to manage ADL fasteners with set up ?OT Short Term Goal 4 (Week 1): Pt will demonstrate safe standing balance for 2 grooming tasks sink side with contact guard A ? ?Skilled Therapeutic Interventions/Progress Updates:  ?  Session 1 ?Pt received supine with no c/o pain, agreeable to OT session. He required mod A for bed mobility, difficulty motor planning scooting forward. Mod A to stand from EOB but once on his feet he required only CGA/min A to ambulate into the bathroom using the RW. Mod A for toileting tasks, voiding urine and BM. He declined shower stating he had taken one yesterday. He stood to complete peri hygiene with min A from elevated BSC over toilet and with single UE support on the RW- performing thorough hygiene with CGA. He transferred back to the w/c with min A with light grasp on RW and requiring cueing for walker management. He donned pants with assist required to thread over BLE, min A to stand and then he was able to pull them up with min A. He was set up to eat breakfast- D1 diet with nectar thick liquids. He required cueing for bite size management, pacing, and liquid wash. He had a delayed cough several times during eating. Grade +1 edema observed in both of patient's hands, feet and LE ok. MD aware. He required increased  time to consume breakfast d/t swallowing precautions/strategies and slow pace. Pt was left sitting up in the w/c with the chair alarm set and all needs within reach.  ? ? ? ?Session 2 ?Pt received sitting up with no c/o pain, agreeable to take shower this session. He required mod A to power up into standing from the recliner. He completed functional mobility into the bathroom with min A however had a large LOB as he was turning and required mod/max to correct. He required min cueing for sequencing turning into TTB and doffing clothing. He completed bathing with good automaticity of tasks and appropriate thoroughness. Mod A to stand and wash his peri areas. Posterior lean when removing BUE from the grab bars in standing, requiring min A. Functional mobility back to the recliner with min A using the RW. He dressed with mod A required for LB and (S) for UB. He then completed 2 trials of 50 ft of functional mobility without an AD- requiring min-mod A for several small LOB's. Pt was left sitting up in the recliner with all needs met, chair alarm set, and call bell within reach.  ? ? ? ?Therapy Documentation ?Precautions:  ?Precautions ?Precautions: Fall ?Restrictions ?Weight Bearing Restrictions: No ? ?Therapy/Group: Individual Therapy ? ?Curtis Sites ?09/09/2021, 6:12 AM ?

## 2021-09-09 NOTE — Progress Notes (Signed)
?                                                       PROGRESS NOTE ? ? ?Subjective/Complaints: ?Patient just finished breakfast.  Had no issues with that this morning.  Tells me that he slept well.  Denies any pain or problems breathing. ? ?Review of Systems  ?Constitutional: Negative.   ?HENT: Negative.    ?Eyes: Negative.   ?Respiratory:  Positive for cough.   ?Cardiovascular: Negative.   ?Gastrointestinal: Negative.   ?Genitourinary: Negative.   ?Musculoskeletal: Negative.   ?Skin: Negative.   ?Neurological: Negative.   ?Psychiatric/Behavioral: Negative.     ? ? ?Objective: ?  ?No results found. ?Recent Labs  ?  09/07/21 ?0450 09/08/21 ?0459  ?WBC 4.4 3.5*  ?HGB 9.0* 8.4*  ?HCT 27.0* 25.5*  ?PLT 162 180  ? ? ?Recent Labs  ?  09/07/21 ?0450 09/08/21 ?0459  ?NA 141 142  ?K 4.1 3.9  ?CL 109 108  ?CO2 24 27  ?GLUCOSE 112* 85  ?BUN 13 17  ?CREATININE 1.31* 1.27*  ?CALCIUM 8.8* 9.1  ? ? ? ?Intake/Output Summary (Last 24 hours) at 09/09/2021 0928 ?Last data filed at 09/09/2021 0700 ?Gross per 24 hour  ?Intake 376 ml  ?Output 975 ml  ?Net -599 ml  ? ?  ? ?  ? ?Physical Exam: ?Vital Signs ?Blood pressure 131/64, pulse (!) 57, temperature 98 ?F (36.7 ?C), temperature source Oral, resp. rate 15, height '5\' 6"'$  (1.676 m), weight 88 kg, SpO2 100 %. ?General: No acute distress ?HEENT: NCAT, EOMI, oral membranes moist ?Cards: reg rate  ?Chest: normal effort ?Abdomen: Soft, NT, ND ?Skin: dry, intact ?Extremities: no edema ?Psych: pleasant and appropriate  ?Skin: Clean and intact without signs of breakdown ?Neuro:   Patient is alert and oriented to self.  Speech is dysarthric. Occasional cough to clear throat after swallowing. Patient was attentive and can follow commands.  Moves all fours at 4 out of 5.  No focal sensory deficits. ?Musculoskeletal: Full ROM, No pain with AROM or PROM in the neck, trunk, or extremities. Posture appropriate   ? ? ?Assessment/Plan: ?1. Functional deficits which require 3+ hours per day of  interdisciplinary therapy in a comprehensive inpatient rehab setting. ?Physiatrist is providing close team supervision and 24 hour management of active medical problems listed below. ?Physiatrist and rehab team continue to assess barriers to discharge/monitor patient progress toward functional and medical goals ? ?Care Tool: ? ?Bathing ?   ?Body parts bathed by patient: Right arm, Left arm  ? Body parts bathed by helper: Right arm ?  ?  ?Bathing assist Assist Level: Maximal Assistance - Patient 24 - 49% ?  ?  ?Upper Body Dressing/Undressing ?Upper body dressing   ?What is the patient wearing?: Pull over shirt ?   ?Upper body assist Assist Level: Moderate Assistance - Patient 50 - 74% ?   ?Lower Body Dressing/Undressing ?Lower body dressing ? ? ?   ?What is the patient wearing?: Pants ? ?  ? ?Lower body assist Assist for lower body dressing: Moderate Assistance - Patient 50 - 74% ?   ? ?Toileting ?Toileting    ?Toileting assist Assist for toileting: Minimal Assistance - Patient > 75% ?  ?  ?Transfers ?Chair/bed transfer ? ?Transfers assist ?   ? ?Chair/bed transfer assist  level: Minimal Assistance - Patient > 75% ?  ?  ?Locomotion ?Ambulation ? ? ?Ambulation assist ? ?   ? ?Assist level: Moderate Assistance - Patient 50 - 74% ?Assistive device: Walker-rolling ?Max distance: 34f  ? ?Walk 10 feet activity ? ? ?Assist ?   ? ?Assist level: Moderate Assistance - Patient - 50 - 74% ?Assistive device: Hand held assist, Other (comment), Walker-rolling  ? ?Walk 50 feet activity ? ? ?Assist Walk 50 feet with 2 turns activity did not occur: Safety/medical concerns ? ?  ?   ? ? ?Walk 150 feet activity ? ? ?Assist Walk 150 feet activity did not occur: Safety/medical concerns ? ?  ?  ?  ? ?Walk 10 feet on uneven surface  ?activity ? ? ?Assist Walk 10 feet on uneven surfaces activity did not occur: Safety/medical concerns ? ? ?  ?   ? ?Wheelchair ? ? ? ? ?Assist Is the patient using a wheelchair?: Yes ?Type of Wheelchair: Manual ?   ? ?Wheelchair assist level: Dependent - Patient 0% ?   ? ? ?Wheelchair 50 feet with 2 turns activity ? ? ? ?Assist ? ?  ?  ? ? ?Assist Level: Dependent - Patient 0%  ? ?Wheelchair 150 feet activity  ? ? ? ?Assist ?   ? ? ?Assist Level: Dependent - Patient 0%  ? ?Blood pressure 131/64, pulse (!) 57, temperature 98 ?F (36.7 ?C), temperature source Oral, resp. rate 15, height '5\' 6"'$  (1.676 m), weight 88 kg, SpO2 100 %. ?Medical Problem List and Plan: ?1. Functional deficits secondary to acute encephalopathy with baseline intellectual disability ?            -patient may shower ?            -ELOS/Goals: 12-14 days ?         -Continue CIR therapies including PT, OT, and SLP  ?2.  Antithrombotics: ?-DVT/anticoagulation:  Pharmaceutical: Lovenox ?            -antiplatelet therapy: N/A ?3. Pain Management: Tylenol as needed ?4. Mood: Provide emotional support ?            -antipsychotic agents: N/A ?5. Neuropsych: This patient is not capable of making decisions on his own behalf. ?6. Skin/Wound Care: Routine skin  checks ?7. Fluids/Electrolytes/Nutrition:  ? -appetite seems reasonable so far ?8.  Recurrent aspiration pneumonia.  Antibiotic therapy completed 09/06/2021.  Currently on dysphagia #1 nectar thick liquid diet.  Follow-up speech therapy ? -has occ cough when eating but is easily able to clear.  ? -need to make sure he's always sitting up at 90 deg ? -?family aware of aspiration precautions.  ?9.  Hypotension.  ProAmatine 5 mg 3 times daily.  Monitor with increased mobility.    ? -bp controlled ?10.  Diabetes mellitus.  Latest hemoglobin A1c 6.4.  SSI.  Patient on Glucophage 1000 mg daily prior to admission.  Resume as needed.  ?-CBGs tightly controlled at present ?11.  Normocytic anemia.  Patient with hematology work-up in the past.  Follow-up CBC ? -hgb steadily dropping over last week. ? -had one + test in March, still waiting on stool sample ? -recheck hgb tomorow ?12.  Hyperlipidemia.  Patient on Lopid/Lipitor  prior to admission.  Currently on hold due to dysphagia ?13. Recurrent hypothermia with unclear cause ?            -Monitor, continue environmental warming as needed ?14. AKI on CKD stage IIIa. improved continue to monitor ? -  Cr stable/in range   ?15. GERD. Continue PPI ?16. Bradycardia. Seen by cardiology. Continue to monitor ? -Heart rate in 40-50'ss.  Patient asymptomatic ? ?LOS: ?2 days ?A FACE TO FACE EVALUATION WAS PERFORMED ? ?Meredith Staggers ?09/09/2021, 9:28 AM  ? ?  ?

## 2021-09-09 NOTE — Progress Notes (Signed)
Physical Therapy Session Note ? ?Patient Details  ?Name: Daniel Bailey. ?MRN: [id number] ?Date of Birth: 1950/09/22 ? ?Today's Date: 09/09/2021 ?PT Individual Time: 8338-2505 ?PT Individual Time Calculation (min): 73 min  ? ?Short Term Goals: ?Week 1:  PT Short Term Goal 1 (Week 1): pt will perform bed mobility with CGA ?PT Short Term Goal 2 (Week 1): pt will transfer bed<>chair with LRAD and CGA ?PT Short Term Goal 3 (Week 1): pt will ambulate 66f with LRAD and min AWeek 1:  PT Short Term Goal 1 (Week 1): pt will perform bed mobility with CGA ?PT Short Term Goal 2 (Week 1): pt will transfer bed<>chair with LRAD and CGA ?PT Short Term Goal 3 (Week 1): pt will ambulate 255fwith LRAD and min A ? ?Skilled Therapeutic Interventions/Progress Updates:  ?Patient supine in bed on entrance to room. Patient alert and agreeable to PT session.  ? ?Patient with no pain complaint throughout session. ? ?Therapeutic Activity: ?Bed Mobility: Patient performed supine --> sit with CGA. VC/ tc required for technique to reduce use of bed rail. At end of session, pt able to bring BLE to bed surface with no physical assist/ CGA.  ?Transfers: Patient performed sit<>stand and stand pivot transfers throughout session with MinA initially and improving to CGA. On initial stand from bed, pt reaches out to RW  and requires verbal cues for safe push-to-stand technique.  ? ?Gait Training:  ?Patient ambulated 1667x1/ 120' x1/ 8538x1 using RW with CGA. Demonstrated need for short distance amb initially, but then is willing to attempt longer bouts. Provided vc/ tc for increased step height/ length, faster pace, level gaze, and upright posture.  ? ?Therapeutic Exercise: ?Pt guided in continuous reciprocation of BUE and BLE using NuStep L3 x 1260mwith focus on cardiorespiratory strengthening, general strengthening, and coordination. Initially using BLE only for first 6mi43mthen brief rest break, then 6min48mth BUE included. Pt requires consistent cueing  to maintain larger movements with full extension of BLE and increased pace close to 35 steps/ min.  ? ?At end of session, pt moved to new room on 4th floor. Patient seated upright  in recliner at end of session with brakes locked, belt alarm set, and all needs within reach. ? ? ?Therapy Documentation ?Precautions:  ?Precautions ?Precautions: Fall ?Restrictions ?Weight Bearing Restrictions: No ?General: ?  ?Vital Signs: ? ?Pain: ?Pain Assessment ?Pain Scale: 0-10 ?Pain Score: 0-No pain ? ?Therapy/Group: Individual Therapy ? ?JulieAlger SimonsDPT ?09/09/2021, 9:32 AM  ?

## 2021-09-09 NOTE — IPOC Note (Signed)
Overall Plan of Care (IPOC) ?Patient Details ?Name: Daniel Bailey. ?MRN: [id number] ?DOB: 1950-07-05 ? ?Admitting Diagnosis: Acute encephalopathy ? ?Hospital Problems: Principal Problem: ?  Acute encephalopathy ? ? ? ? Functional Problem List: ?Nursing Bladder, Bowel, Endurance, Medication Management, Motor, Nutrition, Safety, Skin Integrity  ?PT Balance, Edema, Endurance, Motor, Nutrition, Safety, Pain  ?OT Balance, Motor, Nutrition, Cognition, Vision, Edema, Endurance, Safety  ?SLP    ?TR    ?    ? Basic ADL?s: ?OT Grooming, Bathing, Dressing, Toileting  ? ?  Advanced  ADL?s: ?OT    ?   ?Transfers: ?PT Bed Mobility, Bed to Chair, Car, Furniture  ?OT Toilet, Tub/Shower  ? ?  Locomotion: ?PT Ambulation, Wheelchair Mobility, Stairs  ? ?  Additional Impairments: ?OT    ?SLP Swallowing ?  ?   ?TR    ? ? ?Anticipated Outcomes ?Item Anticipated Outcome  ?Self Feeding n/a  ?Swallowing ? Min A ?  ?Basic self-care ? Supervision overall  ?Toileting ? Supervision ?  ?Bathroom Transfers Supervision  ?Bowel/Bladder ? supervision  ?Transfers ? supervision with LRAD  ?Locomotion ? supervision with LRAD  ?Communication ?    ?Cognition ?    ?Pain ? n/a  ?Safety/Judgment ? supervision  ? ?Therapy Plan: ?PT Intensity: Minimum of 1-2 x/day ,45 to 90 minutes ?PT Frequency: 5 out of 7 days ?PT Duration Estimated Length of Stay: 14 days ?OT Intensity: Minimum of 1-2 x/day, 45 to 90 minutes ?OT Frequency: 5 out of 7 days ?OT Duration/Estimated Length of Stay: ~ 14 days ?SLP Intensity: Minumum of 1-2 x/day, 30 to 90 minutes ?SLP Frequency: 3 to 5 out of 7 days ?SLP Duration/Estimated Length of Stay: 2 weeks  ? ?Due to the current state of emergency, patients may not be receiving their 3-hours of Medicare-mandated therapy. ? ? Team Interventions: ?Nursing Interventions Patient/Family Education, Bladder Management, Bowel Management, Disease Management/Prevention, Medication Management, Skin Care/Wound Management, Discharge Planning,  Dysphagia/Aspiration Precaution Training  ?PT interventions Ambulation/gait training, Discharge planning, Functional mobility training, Therapeutic Activities, Visual/perceptual remediation/compensation, Balance/vestibular training, Disease management/prevention, Neuromuscular re-education, Skin care/wound management, Therapeutic Exercise, Wheelchair propulsion/positioning, Cognitive remediation/compensation, DME/adaptive equipment instruction, Pain management, Splinting/orthotics, UE/LE Strength taining/ROM, Community reintegration, Patient/family education, Stair training, UE/LE Coordination activities, Psychosocial support  ?OT Interventions Balance/vestibular training, Disease mangement/prevention, Neuromuscular re-education, Patient/family education, Self Care/advanced ADL retraining, Therapeutic Exercise, UE/LE Coordination activities, Cognitive remediation/compensation, Discharge planning, DME/adaptive equipment instruction, Functional mobility training, Therapeutic Activities, UE/LE Strength taining/ROM, Visual/perceptual remediation/compensation  ?SLP Interventions Dysphagia/aspiration precaution training, Environmental controls, Cueing hierarchy, Functional tasks, Patient/family education, Therapeutic Activities  ?TR Interventions    ?SW/CM Interventions Discharge Planning, Psychosocial Support, Patient/Family Education  ? ?Barriers to Discharge ?MD  Medical stability  ?Nursing Decreased caregiver support, Incontinence, Wound Care, Lack of/limited family support, Nutrition means ?1 level, ramped entrance. Caregiver can provide supervision and assist with ADL's. Maudry Diego is legal guardian and her son is caregiver.  ?PT Decreased caregiver support ?cognitive deficits, impaired balance/coordination  ?OT   ?   ?SLP Nutrition means ?   ?SW Decreased caregiver support, Lack of/limited family support ?   ? ?Team Discharge Planning: ?Destination: PT-Home ,OT- Home , SLP-Home ?Projected Follow-up: PT-Home health PT,  OT-  Home health OT, 24 hour supervision/assistance, SLP-Home Health SLP, 24 hour supervision/assistance ?Projected Equipment Needs: PT-To be determined, OT- To be determined, SLP-To be determined ?Equipment Details: PT-has RW, OT-  ?Patient/family involved in discharge planning: PT- Patient,  OT-Patient, SLP-Patient unable/family or caregive not available, Family member/caregiver ? ?MD ELOS: 2 weeks ?Medical  Rehab Prognosis:  Excellent ?Assessment: The patient has been admitted for CIR therapies with the diagnosis of encephalopathy. The team will be addressing functional mobility, strength, stamina, balance, safety, adaptive techniques and equipment, self-care, bowel and bladder mgt, patient and caregiver education, NMR, swallowing, communication, cognition. Goals have been set at supervision for selfcare and mobility and supervision to min assist with cognition, swallowing. Anticipated discharge destination is home if enough assist is available. ? ? ?   ? ? ?See Team Conference Notes for weekly updates to the plan of care  ?

## 2021-09-09 NOTE — Progress Notes (Signed)
Patient ID: Daniel Hayward., male   DOB: 17-Feb-1951, 71 y.o.   MRN: [id number] ? ?SW returned phone call to pt cousin Daniel Bailey to introduce self, explain role, and discuss discharge process. SW discussed d/c plan. Reports there is only intermittent support available as she and her son Daniel Bailey (who lives with pt) work during the day. States her son Daniel Bailey is home at night. SW discussed alternative options such as Consulting civil engineer, applying for MCD and LTC MCD, and SNF short term rehab. SW shared current ELOS, and informed will continue to provide updates after team conference next week.  ? ?Loralee Pacas, MSW, LCSWA ?Office: [phone] ?Cell: [phone] ?Fax: [phone]  ?

## 2021-09-10 LAB — GLUCOSE, CAPILLARY
Glucose-Capillary: 74 mg/dL (ref 70–99)
Glucose-Capillary: 105 mg/dL — ABNORMAL HIGH (ref 70–99)
Glucose-Capillary: 128 mg/dL — ABNORMAL HIGH (ref 70–99)
Glucose-Capillary: 136 mg/dL — ABNORMAL HIGH (ref 70–99)

## 2021-09-10 LAB — HEMOGLOBIN AND HEMATOCRIT, BLOOD
HCT: 25.5 % — ABNORMAL LOW (ref 39.0–52.0)
Hemoglobin: 8.8 g/dL — ABNORMAL LOW (ref 13.0–17.0)

## 2021-09-10 NOTE — Progress Notes (Signed)
?                                                       PROGRESS NOTE ? ? ?Subjective/Complaints: ?Patient just finished breakfast.  Had no issues with that this morning.  Tells me that he slept well.  Denies any pain or problems breathing. ? ?Review of Systems  ?Constitutional: Negative.   ?HENT: Negative.    ?Respiratory:  Negative for cough.   ?Cardiovascular: Negative.   ?Gastrointestinal: Negative.   ?Genitourinary: Negative.   ?Skin: Negative.   ?Neurological: Negative.   ?Psychiatric/Behavioral: Negative.     ? ? ?Objective: ?  ?No results found. ?Recent Labs  ?  09/08/21 ?0459 09/10/21 ?6948  ?WBC 3.5*  --   ?HGB 8.4* 8.8*  ?HCT 25.5* 25.5*  ?PLT 180  --   ? ? ?Recent Labs  ?  09/08/21 ?0459  ?NA 142  ?K 3.9  ?CL 108  ?CO2 27  ?GLUCOSE 85  ?BUN 17  ?CREATININE 1.27*  ?CALCIUM 9.1  ? ? ? ?Intake/Output Summary (Last 24 hours) at 09/10/2021 0844 ?Last data filed at 09/10/2021 0815 ?Gross per 24 hour  ?Intake 675 ml  ?Output 1604 ml  ?Net -929 ml  ? ?  ? ?  ? ?Physical Exam: ?Vital Signs ?Blood pressure 122/64, pulse (!) 43, temperature 97.8 ?F (36.6 ?C), resp. rate 16, height '5\' 6"'$  (1.676 m), weight 88.3 kg, SpO2 98 %. ?Constitutional: No distress . Vital signs reviewed. ?HEENT: NCAT, EOMI, oral membranes moist ?Neck: supple ?Cardiovascular: RRR without murmur. No JVD    ?Respiratory/Chest: CTA Bilaterally without wheezes or rales. Normal effort    ?GI/Abdomen: BS +, non-tender, non-distended ?Ext: no clubbing, cyanosis, or edema ?Psych: pleasant and cooperative  ?Skin: Clean and intact without signs of breakdown ?Neuro:   Patient is alert and oriented to self.  Speech remains dysarthric. Occasional cough to clear throat after swallowing. Patient was attentive and can follow commands.  Moves all fours at 4 out of 5.  No focal sensory deficits. ?Musculoskeletal: Full ROM, No pain with AROM or PROM in the neck, trunk, or extremities. Posture appropriate   ? ? ?Assessment/Plan: ?1. Functional deficits which require  3+ hours per day of interdisciplinary therapy in a comprehensive inpatient rehab setting. ?Physiatrist is providing close team supervision and 24 hour management of active medical problems listed below. ?Physiatrist and rehab team continue to assess barriers to discharge/monitor patient progress toward functional and medical goals ? ?Care Tool: ? ?Bathing ?   ?Body parts bathed by patient: Right arm, Right lower leg, Left arm, Left lower leg, Chest, Abdomen, Front perineal area, Buttocks, Right upper leg, Left upper leg, Face  ? Body parts bathed by helper: Right arm ?  ?  ?Bathing assist Assist Level: Minimal Assistance - Patient > 75% ?  ?  ?Upper Body Dressing/Undressing ?Upper body dressing   ?What is the patient wearing?: Pull over shirt ?   ?Upper body assist Assist Level: Minimal Assistance - Patient > 75% ?   ?Lower Body Dressing/Undressing ?Lower body dressing ? ? ?   ?What is the patient wearing?: Pants, Incontinence brief ? ?  ? ?Lower body assist Assist for lower body dressing: Moderate Assistance - Patient 50 - 74% ?   ? ?Toileting ?Toileting    ?Toileting assist Assist for toileting: Minimal  Assistance - Patient > 75% ?  ?  ?Transfers ?Chair/bed transfer ? ?Transfers assist ?   ? ?Chair/bed transfer assist level: Minimal Assistance - Patient > 75% ?  ?  ?Locomotion ?Ambulation ? ? ?Ambulation assist ? ?   ? ?Assist level: Moderate Assistance - Patient 50 - 74% ?Assistive device: Walker-rolling ?Max distance: 65f  ? ?Walk 10 feet activity ? ? ?Assist ?   ? ?Assist level: Moderate Assistance - Patient - 50 - 74% ?Assistive device: Hand held assist, Other (comment), Walker-rolling  ? ?Walk 50 feet activity ? ? ?Assist Walk 50 feet with 2 turns activity did not occur: Safety/medical concerns ? ?  ?   ? ? ?Walk 150 feet activity ? ? ?Assist Walk 150 feet activity did not occur: Safety/medical concerns ? ?  ?  ?  ? ?Walk 10 feet on uneven surface  ?activity ? ? ?Assist Walk 10 feet on uneven surfaces  activity did not occur: Safety/medical concerns ? ? ?  ?   ? ?Wheelchair ? ? ? ? ?Assist Is the patient using a wheelchair?: Yes ?Type of Wheelchair: Manual ?  ? ?Wheelchair assist level: Dependent - Patient 0% ?   ? ? ?Wheelchair 50 feet with 2 turns activity ? ? ? ?Assist ? ?  ?  ? ? ?Assist Level: Dependent - Patient 0%  ? ?Wheelchair 150 feet activity  ? ? ? ?Assist ?   ? ? ?Assist Level: Dependent - Patient 0%  ? ?Blood pressure 122/64, pulse (!) 43, temperature 97.8 ?F (36.6 ?C), resp. rate 16, height '5\' 6"'$  (1.676 m), weight 88.3 kg, SpO2 98 %. ?Medical Problem List and Plan: ?1. Functional deficits secondary to acute encephalopathy with baseline intellectual disability ?            -patient may shower ?            -ELOS/Goals: 12-14 days ?       -Continue CIR therapies including PT, OT, and SLP  ?2.  Antithrombotics: ?-DVT/anticoagulation:  Pharmaceutical: Lovenox ?            -antiplatelet therapy: N/A ?3. Pain Management: Tylenol as needed ?4. Mood: Provide emotional support ?            -antipsychotic agents: N/A ?5. Neuropsych: This patient is not capable of making decisions on his own behalf. ?6. Skin/Wound Care: Routine skin  checks ?7. Fluids/Electrolytes/Nutrition:  ? -appetite seems reasonable so far ?8.  Recurrent aspiration pneumonia.  Antibiotic therapy completed 09/06/2021.  Currently on dysphagia #1 nectar thick liquid diet.  Follow-up speech therapy ? -has occ cough when eating but is easily able to clear.  ? -  sitting up at 90 deg for meals ? -family aware of aspiration precautions--palliative care f/u.  ?9.  Hypotension.  ProAmatine 5 mg 3 times daily.  Monitor with increased mobility.    ? -bp controlled 5/6 ?10.  Diabetes mellitus.  Latest hemoglobin A1c 6.4.  SSI.  Patient on Glucophage 1000 mg daily prior to admission.  Resume as needed.  ?-CBGs tightly controlled at present ?11.  Normocytic anemia.  Patient with hematology work-up in the past.  Follow-up CBC ? -hgb steadily dropping over  last week. ? -had one + test in March, still waiting on stool sample ? -hgb back up to 8.8 5/6 ?12.  Hyperlipidemia.  Patient on Lopid/Lipitor prior to admission.  Currently on hold due to dysphagia ?13. Recurrent hypothermia with unclear cause ?            -  Monitor, continue environmental warming as needed ?14. AKI on CKD stage IIIa. improved continue to monitor ? -Cr stable/in range   ?15. GERD. Continue PPI ?16. Bradycardia. Seen by cardiology. Continue to monitor ? -Heart rate remains in 40-50'ss.  Patient asymptomatic ? ?LOS: ?3 days ?A FACE TO FACE EVALUATION WAS PERFORMED ? ?Meredith Staggers ?09/10/2021, 8:44 AM  ? ?  ?

## 2021-09-10 NOTE — Progress Notes (Signed)
Occupational Therapy Session Note ? ?Patient Details  ?Name: Daniel Bailey. ?MRN: [id number] ?Date of Birth: [birth year] ? ?Today's Date: 09/10/2021 ?OT Individual Time: 6761-9509 ?OT Individual Time Calculation (min): 60 min  ? ? ?Short Term Goals: ?Week 1:  OT Short Term Goal 1 (Week 1): Pt will ambulate with min A to obtain clothing items with RW ?OT Short Term Goal 2 (Week 1): Pt will be able to complete LB clothing with min A including socks and shoes ?OT Short Term Goal 3 (Week 1): Pt will demonstrate improved functional coordination via edema control to manage ADL fasteners with set up ?OT Short Term Goal 4 (Week 1): Pt will demonstrate safe standing balance for 2 grooming tasks sink side with contact guard A ? ?Skilled Therapeutic Interventions/Progress Updates:  ?  1:1. Pt received in bed with no pain. Pt agreeable to OT and reporting need to toilet. Pt requires increased time for bed mobilty and scooting to EOB. Pt completes MOD A power up to stand and then CGA for functional mobility in room with VC for hand placement on RW, proximity to RW and management of RW. Pt requires cuing for posterior reach to advance pants down hips with increased time. A to pull back up/hygiene for thoroughness. Significantly increased time to void seated Pt grooms at sink for hand hygeien and oral care with S. Pt completes 3x30 ball toss (chest, bounce, overhead pass) in seated position with 1 # wrist weights to improve BUE coordination/strengthening required for BADLs/functional transfers. Attempted standing cone taps with OT IDing  color, however pt could not accutrately follow color therefore pointed to cone and pt able ot complete with CGA and MIN A to power up from w/c. Exited session with pt seated in recliner, exit alarm on and call light in reach ? ? ? ?Therapy Documentation ?Precautions:  ?Precautions ?Precautions: Fall ?Restrictions ?Weight Bearing Restrictions: No ?=Therapy/Group: Individual Therapy ? ?Lowella Dell  Lakeishia Truluck ?09/10/2021, 1:44 PM ?

## 2021-09-10 NOTE — Plan of Care (Signed)
?  Problem: Consults ?Goal: RH GENERAL PATIENT EDUCATION ?Description: See Patient Education module for education specifics. ?Outcome: Progressing ?Goal: Skin Care Protocol Initiated - if Braden Score 18 or less ?Description: If consults are not indicated, leave blank or document N/A ?Outcome: Progressing ?Goal: Diabetes Guidelines if Diabetic/Glucose > 140 ?Description: If diabetic or lab glucose is > 140 mg/dl - Initiate Diabetes/Hyperglycemia Guidelines & Document Interventions  ?Outcome: Progressing ?  ?Problem: RH BOWEL ELIMINATION ?Goal: RH STG MANAGE BOWEL WITH ASSISTANCE ?Description: STG Manage Bowel with Supervision Assistance. ?Outcome: Progressing ?Goal: RH STG MANAGE BOWEL W/MEDICATION W/ASSISTANCE ?Description: STG Manage Bowel with Medication with Supervision Assistance. ?Outcome: Progressing ?  ?Problem: RH BLADDER ELIMINATION ?Goal: RH STG MANAGE BLADDER WITH ASSISTANCE ?Description: STG Manage Bladder With Supervision Assistance ?Outcome: Progressing ?Goal: RH STG MANAGE BLADDER WITH MEDICATION WITH ASSISTANCE ?Description: STG Manage Bladder With Medication With Supervision Assistance. ?Outcome: Progressing ?  ?Problem: RH SKIN INTEGRITY ?Goal: RH STG MAINTAIN SKIN INTEGRITY WITH ASSISTANCE ?Description: STG Maintain Skin Integrity With Supervision Assistance. ?Outcome: Progressing ?Goal: RH STG ABLE TO PERFORM INCISION/WOUND CARE W/ASSISTANCE ?Description: STG Able To Perform Incision/Wound Care With Supervision Assistance. ?Outcome: Progressing ?  ?Problem: RH SAFETY ?Goal: RH STG ADHERE TO SAFETY PRECAUTIONS W/ASSISTANCE/DEVICE ?Description: STG Adhere to Safety Precautions With Cues and Reminders. ?Outcome: Progressing ?  ?Problem: RH KNOWLEDGE DEFICIT GENERAL ?Goal: RH STG INCREASE KNOWLEDGE OF SELF CARE AFTER HOSPITALIZATION ?Description: Patient and caregivers will demonstrate knowledge of self-care management, bowel/bladder management, skin/wound care with educational materials and handouts  provided by staff with supervision assistance at discharge. ?Outcome: Progressing ?  ?

## 2021-09-11 DIAGNOSIS — R531 Weakness: Secondary | ICD-10-CM

## 2021-09-11 DIAGNOSIS — R1310 Dysphagia, unspecified: Secondary | ICD-10-CM

## 2021-09-11 LAB — GLUCOSE, CAPILLARY
Glucose-Capillary: 77 mg/dL (ref 70–99)
Glucose-Capillary: 99 mg/dL (ref 70–99)
Glucose-Capillary: 147 mg/dL — ABNORMAL HIGH (ref 70–99)
Glucose-Capillary: 129 mg/dL — ABNORMAL HIGH (ref 70–99)

## 2021-09-11 NOTE — Progress Notes (Signed)
Patient's temp. 94.2 rectally, 113/65 hr 59 16 95 room air. Patient denies any discomfort or pain.Warm blankets and warm packs given to the patient. NP Luetta Nutting notified of patient's temp. and MEWS result. No orders received for now. Will continue to monitor patient. ?

## 2021-09-11 NOTE — Progress Notes (Signed)
?                                                       PROGRESS NOTE ? ? ?Subjective/Complaints: ?No new complaints. Slept well. No pain ? ? ?Review of Systems  ?Constitutional: Negative.   ?HENT: Negative.    ?Respiratory:  Negative for cough.   ?Cardiovascular: Negative.   ?Gastrointestinal: Negative.   ?Genitourinary: Negative.   ?Skin: Negative.   ?Neurological: Negative.   ?Psychiatric/Behavioral: Negative.     ? ? ?Objective: ?  ?No results found. ?Recent Labs  ?  09/10/21 ?5366  ?HGB 8.8*  ?HCT 25.5*  ? ? ?No results for input(s): NA, K, CL, CO2, GLUCOSE, BUN, CREATININE, CALCIUM in the last 72 hours. ? ? ?Intake/Output Summary (Last 24 hours) at 09/11/2021 4403 ?Last data filed at 09/11/2021 0756 ?Gross per 24 hour  ?Intake 1390 ml  ?Output 1000 ml  ?Net 390 ml  ? ?  ? ?  ? ?Physical Exam: ?Vital Signs ?Blood pressure 122/73, pulse (!) 43, temperature 98 ?F (36.7 ?C), resp. rate 16, height '5\' 6"'$  (1.676 m), weight 88.1 kg, SpO2 98 %. ?Constitutional: No distress . Vital signs reviewed. ?HEENT: NCAT, EOMI, oral membranes moist ?Neck: supple ?Cardiovascular: RRR without murmur. No JVD    ?Respiratory/Chest: CTA Bilaterally without wheezes or rales. Normal effort    ?GI/Abdomen: BS +, non-tender, non-distended ?Ext: no clubbing, cyanosis, or edema ?Psych: pleasant and cooperative  ?Skin: Clean and intact without signs of breakdown ?Neuro:   Patient is alert and oriented to self.  Speech still dysarthric. Occasional cough to clear throat after swallowing. Patient was attentive and can follow commands.  Moves all fours at 4 out of 5.  No focal sensory deficits. ?Musculoskeletal: Full ROM, No pain with AROM or PROM in the neck, trunk, or extremities. Posture appropriate   ? ? ?Assessment/Plan: ?1. Functional deficits which require 3+ hours per day of interdisciplinary therapy in a comprehensive inpatient rehab setting. ?Physiatrist is providing close team supervision and 24 hour management of active medical problems  listed below. ?Physiatrist and rehab team continue to assess barriers to discharge/monitor patient progress toward functional and medical goals ? ?Care Tool: ? ?Bathing ?   ?Body parts bathed by patient: Right arm, Right lower leg, Left arm, Left lower leg, Chest, Abdomen, Front perineal area, Buttocks, Right upper leg, Left upper leg, Face  ? Body parts bathed by helper: Right arm ?  ?  ?Bathing assist Assist Level: Minimal Assistance - Patient > 75% ?  ?  ?Upper Body Dressing/Undressing ?Upper body dressing   ?What is the patient wearing?: Pull over shirt ?   ?Upper body assist Assist Level: Minimal Assistance - Patient > 75% ?   ?Lower Body Dressing/Undressing ?Lower body dressing ? ? ?   ?What is the patient wearing?: Pants, Incontinence brief ? ?  ? ?Lower body assist Assist for lower body dressing: Moderate Assistance - Patient 50 - 74% ?   ? ?Toileting ?Toileting    ?Toileting assist Assist for toileting: Minimal Assistance - Patient > 75% ?  ?  ?Transfers ?Chair/bed transfer ? ?Transfers assist ?   ? ?Chair/bed transfer assist level: Minimal Assistance - Patient > 75% ?  ?  ?Locomotion ?Ambulation ? ? ?Ambulation assist ? ?   ? ?Assist level: Moderate Assistance - Patient  50 - 74% ?Assistive device: Walker-rolling ?Max distance: 67f  ? ?Walk 10 feet activity ? ? ?Assist ?   ? ?Assist level: Moderate Assistance - Patient - 50 - 74% ?Assistive device: Hand held assist, Other (comment), Walker-rolling  ? ?Walk 50 feet activity ? ? ?Assist Walk 50 feet with 2 turns activity did not occur: Safety/medical concerns ? ?  ?   ? ? ?Walk 150 feet activity ? ? ?Assist Walk 150 feet activity did not occur: Safety/medical concerns ? ?  ?  ?  ? ?Walk 10 feet on uneven surface  ?activity ? ? ?Assist Walk 10 feet on uneven surfaces activity did not occur: Safety/medical concerns ? ? ?  ?   ? ?Wheelchair ? ? ? ? ?Assist Is the patient using a wheelchair?: Yes ?Type of Wheelchair: Manual ?  ? ?Wheelchair assist level:  Dependent - Patient 0% ?   ? ? ?Wheelchair 50 feet with 2 turns activity ? ? ? ?Assist ? ?  ?  ? ? ?Assist Level: Dependent - Patient 0%  ? ?Wheelchair 150 feet activity  ? ? ? ?Assist ?   ? ? ?Assist Level: Dependent - Patient 0%  ? ?Blood pressure 122/73, pulse (!) 43, temperature 98 ?F (36.7 ?C), resp. rate 16, height '5\' 6"'$  (1.676 m), weight 88.1 kg, SpO2 98 %. ?Medical Problem List and Plan: ?1. Functional deficits secondary to acute encephalopathy with baseline intellectual disability ?            -patient may shower ?            -ELOS/Goals: 12-14 days ?           -Continue CIR therapies including PT, OT, and SLP   ?2.  Antithrombotics: ?-DVT/anticoagulation:  Pharmaceutical: Lovenox ?            -antiplatelet therapy: N/A ?3. Pain Management: Tylenol as needed ?4. Mood: Provide emotional support ?            -antipsychotic agents: N/A ?5. Neuropsych: This patient is not capable of making decisions on his own behalf. ?6. Skin/Wound Care: Routine skin  checks ?7. Fluids/Electrolytes/Nutrition:  ? -appetite seems reasonable so far ?8.  Recurrent aspiration pneumonia.  Antibiotic therapy completed 09/06/2021.  Currently on dysphagia #1 nectar thick liquid diet.  Follow-up speech therapy ? -has occ cough when eating but is easily able to clear.  ? -  sitting up at 90 deg for meals ? -family aware of aspiration precautions--palliative care f/u.  ?9.  Hypotension.  ProAmatine 5 mg 3 times daily.  Monitor with increased mobility.    ? -bp controlled 5/6 ?10.  Diabetes mellitus.  Latest hemoglobin A1c 6.4.  SSI.  Patient on Glucophage 1000 mg daily prior to admission.  Resume as needed.  ?-CBGs tightly controlled at present ?11.  Normocytic anemia.  Patient with hematology work-up in the past.  Follow-up CBC ? -hgb steadily dropping over last week. ? -had one + test in March, still waiting on stool sample ? -hgb back up to 8.8 5/6 ? -recheck labs tuesday ?12.  Hyperlipidemia.  Patient on Lopid/Lipitor prior to  admission.  Currently on hold due to dysphagia ?13. Recurrent hypothermia with unclear cause ?            -Monitor, continue environmental warming as needed ?14. AKI on CKD stage IIIa. improved continue to monitor ? -Cr stable/in range --recheck tuesday  ?15. GERD. Continue PPI ?16. Bradycardia. Seen by cardiology. Continue to monitor ? -  Heart rate remains in 40-50'ss.  Patient asymptomatic ? ?LOS: ?4 days ?A FACE TO FACE EVALUATION WAS PERFORMED ? ?Meredith Staggers ?09/11/2021, 8:21 AM  ? ?  ?

## 2021-09-11 NOTE — Progress Notes (Signed)
Physical Therapy Session Note ? ?Patient Details  ?Name: Daniel Bailey. ?MRN: [id number] ?Date of Birth: 1951/05/05 ? ?Today's Date: 09/11/2021 ?PT Individual Time: 0800-0900 and 1420-1500 ?PT Individual Time Calculation (min): 60 min and 40 min  ? ?Short Term Goals: ?Week 1:  PT Short Term Goal 1 (Week 1): pt will perform bed mobility with CGA ?PT Short Term Goal 2 (Week 1): pt will transfer bed<>chair with LRAD and CGA ?PT Short Term Goal 3 (Week 1): pt will ambulate 76f with LRAD and min A ? ?Skilled Therapeutic Interventions/Progress Updates:  ?   ?Session 1: ?Patient in bed upon PT arrival. Patient alert and agreeable to PT session. Patient denied pain during session. ? ?Therapeutic Activity: ?Bed Mobility: Patient performed supine to/from sit with supervision in a flat bed without use of bed rails to simulate home set-up. Provided verbal cues for initiation and pushing through elbows to sit up. ?Transfers: Patient performed sit to/from stand x9 with CGA progressing to close supervision with and without the RW. Provided verbal cues for scooting forward, forward weight shift, and hand placement with use of AD each trial. ? ?Gait Training:  ?Patient ambulated >100 feet using RW with CGA-min A for management of AD. Ambulated with decreased gait speed, decreased step length and height, progressed to shuffling gait with fatigue, forward trunk lean, and downward head gaze. Provided verbal cues for erect posture, safe proximity to RW, especially on turns, looking ahead, and increased step height and length. ?6 Min Walk Test:  ?Instructed patient to ambulate as quickly and as safely as possible for 6 minutes using LRAD. Patient was allowed to take standing rest breaks without stopping the test, but if the patient required a sitting rest break the clock would be stopped and the test would be over.  ?Results: 180 feet  in 5:39, limited by fatigue (54.8 meters, Avg speed 0.16 m/s) using a RW with CGA-min A. Results indicate  that the patient has reduced endurance with ambulation compared to age matched norms.  ?Age Matched Norms: 767-79yo M: 527 meters ?MDC: 58.21 meters (190.98 feet) or 50 meters ?(ANPTA Core Set of Outcome Measures for Adults with Neurologic Conditions, 2018) ? ?Neuromuscular Re-ed: ?Berg Balance Test ?Sit to Stand: Able to stand  independently using hands ?Standing Unsupported: Able to stand safely 2 minutes ?Sitting with Back Unsupported but Feet Supported on Floor or Stool: Able to sit safely and securely 2 minutes ?Stand to Sit: Controls descent by using hands ?Transfers: Needs one person to assist ?Standing Unsupported with Eyes Closed: Able to stand 10 seconds with supervision ?Standing Ubsupported with Feet Together: Needs help to attain position and unable to hold for 15 seconds ?From Standing, Reach Forward with Outstretched Arm: Reaches forward but needs supervision ?From Standing Position, Pick up Object from Floor: Able to pick up shoe, needs supervision ?From Standing Position, Turn to Look Behind Over each Shoulder: Looks behind from both sides and weight shifts well ?Turn 360 Degrees: Needs close supervision or verbal cueing ?Standing Unsupported, Alternately Place Feet on Step/Stool: Needs assistance to keep from falling or unable to try ?Standing Unsupported, One Foot in Front: Needs help to step but can hold 15 seconds ?Standing on One Leg: Tries to lift leg/unable to hold 3 seconds but remains standing independently ?Total Score: 29/56 ?Patient demonstrated increased fall risk noted by score of 29/56 on the [employer]Scale.  ?<45/56 = fall risk, <42/56 = predictive of recurrent falls, <40/56 = 100% fall risk  ?>41 =  independent, 21-40 = assistive device, 0-20 = wheelchair level  ?MDC 6.9 (4 pts 45-56, 5 pts 35-44, 7 pts 25-34) ?(ANPTA Core Set of Outcome Measures for Adults with Neurologic Conditions, 2018) ?Five times Sit to Stand Test (FTSS) ?Method: ?Use a straight back chair with a solid seat  that is 17-18? high. Ask participant to sit on the chair with arms folded across their chest.   ?Instructions: ??Stand up and sit down as quickly as possible 5 times, keeping your arms folded across your chest.?   ?Measurement: ?Stop timing when the participant touches the chair in sitting the 5th time. ? ?TIME: 0 sec without hands (with use of upper extremities 33.5 sec) ? ?Cut off scores indicative of increased fall risk: >12 sec CVA, >16 sec PD, >13 sec vestibular ?(ANPTA Core Set of Outcome Measures for Adults with Neurologic Conditions, 2018) ? ?Reviewed outcome measures and interpretation of scores with patient during session. Educated on patient's high risk of falls and goals to reduce his risk of falls during his stay, patient in agreement. ? ?Patient in recliner in the room at end of session with breaks locked, seat belt alarm set, and all needs within reach.  ? ?Session 2: ?Patient in bed with his cousin and caregiver, [name], at bedside upon PT arrival. Patient alert and agreeable to PT session. Patient denied pain during session. ? ?Therapeutic Activity: ?Bed Mobility: Patient performed supine to/from sit with supervision. Provided verbal cues for initiation, slower to initiate this afternoon. ?Transfers: Patient performed sit to/from stand x3 with HHA or RW and blocked practice sit to stand x10 without AD with CGA-supervision. Provided verbal cues for forward weight shift and hand placement on RW. ? ?Gait Training:  ?Patient ambulated >100 feet with HHA feet with min-mod A. He then ambulated >150 feet using RW with CGA-min A. Ambulated as above with increased lateral sway and R LOB without AD.  ? ?Patient in recliner with [name] at bedside at end of session with breaks locked, seat belt alarm set, and all needs within reach.  ? ?Discussed d/c planning with [name] at end of session. Reports 24/l7 is not in place for patient at d/c at this time, working on possible living arrangements that could accommodate  this to reduce risk of aspiration and falls.  ? ?Therapy Documentation ?Precautions:  ?Precautions ?Precautions: Fall ?Restrictions ?Weight Bearing Restrictions: No ? ? ? ?Therapy/Group: Individual Therapy ? ?Doreene Burke PT, DPT ? ?09/11/2021, 6:15 PM  ?

## 2021-09-11 NOTE — Progress Notes (Signed)
Patient ID: [name]., male   DOB: 12/25/1950, 71 y.o.   MRN: [id number] ? ? ? ?Progress Note from the Palliative Medicine Team at [facility] ? ? ?Patient Name: Daniel Bailey.        ?Date: 09/11/2021 ?DOB: March 01, 1951  Age: 71 y.o. MRN#: [id number] ?Attending Physician: Meredith Staggers, MD ?Primary Care Physician: Tower, Wynelle Fanny, MD ?Admit Date: 09/07/2021 ? ? ?Medical records reviewed  ? ?71 y.o. male   admitted on 09/02/2021 with past medical history  ?intellectual disability, sinus bradycardia, prior GIB, prior hospitalization for aspiration PNA, OSA (not yet fitted for CPAP), aortic atherosclerosis, hypertension, anemia, hyperlipidemia and diabetes. Presented 4/28 with LLL PNA suspected due to aspiration and and superimposed encephalopathy on baseline intellectual disability. Cardiology consulted  for bradycardia.  ?  ?Patient currently in  rehab/CIR--physical, occupational therapy and speech therapy ongoing  ?  ?Family face ongoing  treatment options decisions, advanced directive decisions and anticipatory care needs.  ? ? ?This NP visited patient at the bedside as a follow up for palliative medicine needs and emotional support and to meet at bedside with legal guardian [name] for continued conversation regarding goals of care. ? ?Patient is out of bed in the chair, alert and oriented to person and place.  Patient has little insight into his current ? ?Education offered on the pathophysiology of dysphagia specifically as it relates to ongoing aspiration ? ? ? ?Education offered on the risks and benefits of artificial feeding/PEG tube specifically as it relates not totally alleviating risk of aspiration for this patient..  Education offered on the element of eating as it relates to comfort and dignity. ? ?Plan of care: ?-DNR/DNI ?-No artificial feeding or hydration now or in the future ?-Comfort feeds with known risk of aspiration, diet as recommended by speech with aspiration precautions ?-Complete stay in  rehab hoping to gain further strength and better swallow  control ?-Family requesting assistance in long-term placement ? ?MOST form completed ? ?Questions and concerns addressed   Discussed with Dr Naaman Plummer ? ? ?[name] NP  ?Palliative Medicine Team Team Phone # [phone] ?Pager [phone] ?  ?

## 2021-09-11 NOTE — Progress Notes (Signed)
Occupational Therapy Session Note ? ?Patient Details  ?Name: Daniel Bailey. ?MRN: [id number] ?Date of Birth: [birth year] ? ?Today's Date: 09/11/2021 ?OT Individual Time: 1000-1055 ?OT Individual Time Calculation (min): 55 min  ? ? ?Short Term Goals: ?Week 1:  OT Short Term Goal 1 (Week 1): Pt will ambulate with min A to obtain clothing items with RW ?OT Short Term Goal 2 (Week 1): Pt will be able to complete LB clothing with min A including socks and shoes ?OT Short Term Goal 3 (Week 1): Pt will demonstrate improved functional coordination via edema control to manage ADL fasteners with set up ?OT Short Term Goal 4 (Week 1): Pt will demonstrate safe standing balance for 2 grooming tasks sink side with contact guard A ? ?Skilled Therapeutic Interventions/Progress Updates:  ?  Pt resting in recliner upon arrival. Pt declined use of toilet and bathing/dressing. OT intervention with focus on sit<>stand, standing balance, following commands, safety awareness, and activity tolerance to incrase independence with BADLs. Sit>stand from recliner with min A and amb with RW to w/c. Pt engaged in standing activities in day room, including corn hole and horseshoes. Pt required min A/CGA for standing balance. Pt required min demonstration cues to engage in corn hole. Pt completed table task with colored pegs (easy alternating colors) with min demonstration cues. Pt also engaged in ball tapping activity with 3# bar and beach ball 3x20. Pt returned to room and tranfserred to recliner. Belt alarm activated and all needs within reach.  ? ?Therapy Documentation ?Precautions:  ?Precautions ?Precautions: Fall ?Restrictions ?Weight Bearing Restrictions: No ? ?Pain: ? Pt denies pain this morning ? ?Therapy/Group: Individual Therapy ? ?Leroy Libman ?09/11/2021, 10:56 AM ?

## 2021-09-12 DIAGNOSIS — I959 Hypotension, unspecified: Secondary | ICD-10-CM

## 2021-09-12 DIAGNOSIS — R001 Bradycardia, unspecified: Secondary | ICD-10-CM

## 2021-09-12 LAB — GLUCOSE, CAPILLARY
Glucose-Capillary: 101 mg/dL — ABNORMAL HIGH (ref 70–99)
Glucose-Capillary: 132 mg/dL — ABNORMAL HIGH (ref 70–99)
Glucose-Capillary: 80 mg/dL (ref 70–99)
Glucose-Capillary: 143 mg/dL — ABNORMAL HIGH (ref 70–99)

## 2021-09-12 NOTE — Progress Notes (Signed)
Occupational Therapy Session Note ? ?Patient Details  ?Name: Daniel Bailey. ?MRN: [id number] ?Date of Birth: 17-Jan-1951 ? ?Today's Date: 09/12/2021 ?OT Individual Time: 5277-8242 ?OT Individual Time Calculation (min): 73 min  ? ? ?Short Term Goals: ?Week 1:  OT Short Term Goal 1 (Week 1): Pt will ambulate with min A to obtain clothing items with RW ?OT Short Term Goal 2 (Week 1): Pt will be able to complete LB clothing with min A including socks and shoes ?OT Short Term Goal 3 (Week 1): Pt will demonstrate improved functional coordination via edema control to manage ADL fasteners with set up ?OT Short Term Goal 4 (Week 1): Pt will demonstrate safe standing balance for 2 grooming tasks sink side with contact guard A ? ?Skilled Therapeutic Interventions/Progress Updates:  ?  Patient received supine in bed awake and agreeable to therapy.  Patient assisted to edge of bed and once feet on floor, able to stand with min assist with slight cueing for sufficient forward weight shift onto feet.  Patient walked to bathroom with RW and contact guard - continent void and able to stand from low toilet with min assist and use of grab bar.  Walk to shower and showered seated, standing only for pericare.  Patient with increase in cough initially with movement.   ?Ambulated back to edge of bed to finish dressing.  Assisted patient with TED hose.  With increased time patient able to don pants.  Worked on standing and maintaining balance before letting go of support to pull up clothing.   ?Patient's nurse needed to scan patient's bladder, assisted patient to side step toward head of bed, and back to supine.   ?Patient then assisted to recliner with safety belt in place and engaged, call bell in place.  ?SLP in to assist with breakfast.   ? ?Therapy Documentation ?Precautions:  ?Precautions ?Precautions: Fall ?Restrictions ?Weight Bearing Restrictions: No ? ?Pain: ?Pain Assessment ?Pain Scale: 0-10 ?Pain Score: 0-No  pain ? ? ? ? ?Therapy/Group: Individual Therapy ? ?Mariah Milling ?09/12/2021, 12:35 PM ?

## 2021-09-12 NOTE — Progress Notes (Signed)
Speech Language Pathology Daily Session Note ? ?Patient Details  ?Name: Daniel Bailey. ?MRN: [id number] ?Date of Birth: January 16, 1951 ? ?Today's Date: 09/12/2021 ?SLP Individual Time: 9201-0071 ?SLP Individual Time Calculation (min): 25 min ? ?Short Term Goals: ?Week 1: SLP Short Term Goal 1 (Week 1): Patient will consume current diet with Mod verbal cues for use of swallowing compensatory strategies. ?SLP Short Term Goal 2 (Week 1): Patient will self-monitor and correct wet vocal quality with a throat clear and re-swallow with Mod verbal cues. ?SLP Short Term Goal 3 (Week 1): Patient will recall swallowing compensatory strategies with 75% accuracy with Mod verbal cues. ? ?Skilled Therapeutic Interventions: Skilled treatment session focused on dysphagia goals. SLP facilitated session by providing skilled observation with breakfast meal of Dys.1 textures with nectar-thick liquids. Patient recall swallowing compensatory strategies with 75% accuracy and utilized the strategies throughout the meal with Min-Mod A verbal cues resulting in intermittent overt s/s of aspiration. Recommend patient continue current diet with strict aspiration precautions and full supervision for utilization. Patient left upright in recliner with NT present. Continue with current plan of care.  ?   ? ?Pain ?Pain Assessment ?Pain Scale: 0-10 ?Pain Score: 0-No pain ? ?Therapy/Group: Individual Therapy ? ?Eugenia Eldredge ?09/12/2021, 11:08 AM ?

## 2021-09-12 NOTE — Progress Notes (Signed)
Physical Therapy Session Note ? ?Patient Details  ?Name: Daniel Bailey. ?MRN: [id number] ?Date of Birth: [birth year] ? ?Today's Date: 09/12/2021 ?PT Individual Time: 4174-0814 and 1305-1420 ?PT Individual Time Calculation (min): 45 min and 75 min ? ?Short Term Goals: ?Week 1:  PT Short Term Goal 1 (Week 1): pt will perform bed mobility with CGA ?PT Short Term Goal 2 (Week 1): pt will transfer bed<>chair with LRAD and CGA ?PT Short Term Goal 3 (Week 1): pt will ambulate 82f with LRAD and min A ? ?Skilled Therapeutic Interventions/Progress Updates:  ?   ?Session 1: ?Patient in recliner eating breakfast upon PT arrival. Patient alert and agreeable to PT session. Patient denied pain during session. ? ?Per NT, patient with second breakfast tray, patient declined feeling hungry at this time. Provided cranberry juice per patient's request. Provided cues for small sips and additional dry swallow and throat clear between sips as patient drank his juice. Patient then reports that he is ready for PT session.  ? ?Therapeutic Activity: ?Transfers: Patient performed sit to/from stand x6 with supervision with and without RW. Provided verbal cues for forward weight shift and hand placement with RW. ? ?Gait Training:  ?Patient ambulated >100 feet x1 using RW with CGA-min A and x1 without AD with CGA-min A. Ambulated with decreased gait speed, decreased step length and height R>L, intermittent crouched gait, forward trunk lean, and downward head gaze. Provided verbal cues for erect posture, looking at a visual target for looking ahead, increased step length and height, and safe proximity to RW. ? ?Neuromuscular Re-ed: ?Patient performed the following balance and lower extremity motor control activities: ?-sit to stand x10 with supervision, PT performed exercise beside patient to improve timing and control, last 3 without upper extremity support ?-sit to stand with forefeet on blue foam wedge x5 with mod progressing to min A to overcorrect  posterior bias ?-alternating tapping on small orange cones x4 before terminating task due to poor success with following cues for task ?-ambulating forwards and backwards 15 feet in front of a mirror 2x4 focused on erect posture, looking ahead and arm swing without AD with CGA-min A for balance ? ?Patient in recliner at end of session with breaks locked, seat belt alarm set, and all needs within reach.  ? ?Session 2: ?Patient in recliner with lunch barely touched in front of him upon PT arrival. Patient alert and agreeable to PT session. Patient denied pain during session. Reports he does not feel [place] afternoon. Agreeable to eat magic cup on tray. Cued patient for small bites and intermittent sips of nectar thick tea, using dry swallow and/or throat clear between bites/sips. Patient sudden onset of coughing sitting with his legs up in the recliner. Cued for strong cough and throat clear with a break after. Lowered the legs of the recliner and had patient "sit up tall" in the recliner for improved posture for swallowing. Had second bout of coughing with magic cup, despite cues. Set magic cup to the side and patient sipped his tea, will alert SLP to challenges with magic cup.  ? ?Patient declined toileting and reported that he had not voided today. Checked flow sheet and noted patient requiring in/out catheterizations for voiding. Patient reports he was able to void without intervention PTA. Had patient trial toileting with success during session. LPN made aware and set plan to initiate timed toileting with patient to improve voiding without intervention.  ? ?Therapeutic Activity: ?Transfers: Patient performed an ambulatory transfer to/from the bathroom  with min A-CGA without AD to ambulate and use of grab bar for toilet transfer. Patient was continent of bowl and bladder, see flowsheet. Performed peri-care with set-up assist and lower body clothing management with mod A. He performed sit to/from stand x6  with supervision without an AD. Provided verbal cues for forward weight shift. ? ?Gait Training:  ?Patient ambulated >100 feet x3 without and AD with CGA-min A. Ambulated as described above. Utilized music for motivation and increased gait speed with good affect throughout. ?Patient ascended/descended 4x6" steps using B hand rails with CGA. Performed step-to gait pattern throughout. Provided cues for technique and sequencing.  ? ?Patient in recliner in the room at end of session with breaks locked, seat belt alarm set, and all needs within reach.  ? ?Therapy Documentation ?Precautions:  ?Precautions ?Precautions: Fall ?Restrictions ?Weight Bearing Restrictions: No ? ? ? ?Therapy/Group: Individual Therapy ? ?Doreene Burke PT, DPT ? ?09/12/2021, 3:59 PM  ?

## 2021-09-12 NOTE — Progress Notes (Signed)
?   09/11/21 2220  ?Vitals  ?Temp (!) 94.2 ?F (34.6 ?C)  ?Temp Source Rectal  ?BP 113/65  ?MAP (mmHg) 80  ?BP Location Left Arm  ?BP Method Automatic  ?Patient Position (if appropriate) Lying  ?Pulse Rate (!) 59  ?Pulse Rate Source Monitor  ?Resp 16  ?MEWS COLOR  ?MEWS Score Color Yellow  ?Oxygen Therapy  ?SpO2 95 %  ?O2 Device Room Air  ?Pain Assessment  ?Pain Scale 0-10  ?Pain Score 0  ?MEWS Score  ?MEWS Temp 2  ?MEWS Systolic 0  ?MEWS Pulse 0  ?MEWS RR 0  ?MEWS LOC 0  ?MEWS Score 2  ?Provider Notification  ?Date Provider Notified 09/11/21  ?Time Provider Notified 2215  ?Provider response No new orders  ?Date of Provider Response 09/11/21  ?Time of Provider Response 2220  ? ? ?

## 2021-09-12 NOTE — Progress Notes (Signed)
?                                                       PROGRESS NOTE ? ? ?Subjective/Complaints: ?No new complaints or concerns this AM. Denies pain.  ? ? ?Review of Systems  ?Constitutional: Negative.   ?HENT: Negative.    ?Cardiovascular: Negative.  Negative for chest pain.  ?Gastrointestinal: Negative.  Negative for nausea.  ?Genitourinary: Negative.   ?Skin: Negative.   ?Neurological: Negative.   ?Psychiatric/Behavioral: Negative.     ? ? ?Objective: ?  ?No results found. ?Recent Labs  ?  09/10/21 ?4259  ?HGB 8.8*  ?HCT 25.5*  ? ? ?No results for input(s): NA, K, CL, CO2, GLUCOSE, BUN, CREATININE, CALCIUM in the last 72 hours. ? ? ?Intake/Output Summary (Last 24 hours) at 09/12/2021 0734 ?Last data filed at 09/12/2021 0000 ?Gross per 24 hour  ?Intake 350 ml  ?Output 1450 ml  ?Net -1100 ml  ? ?  ? ?  ? ?Physical Exam: ?Vital Signs ?Blood pressure (!) 116/59, pulse 70, temperature 97.7 ?F (36.5 ?C), temperature source Oral, resp. rate 14, height '5\' 6"'$  (1.676 m), weight 89.1 kg, SpO2 97 %. ? ?Constitutional: No distress .  Working with therapy. ?HEENT: NCAT, EOMI, oral membranes moist ?Neck: supple, no LAD ?Cardiovascular: RRR without murmur.    ?Respiratory/Chest: CTA Bilaterally without wheezes or rales. Normal effort    ?GI/Abdomen: BS +, non-tender, non-distended ?Ext: no clubbing, cyanosis, or edema ?Psych: pleasant and cooperative  ?Skin: Clean and intact without signs of breakdown ?Neuro:   Patient is alert and oriented to self.  Speech still dysarthric. Patient was attentive and can follow simple commands.  Moves all 4 extremities. ?Musculoskeletal: Full ROM, No pain with AROM or PROM in the neck, trunk, or extremities. Posture appropriate   ? ? ?Assessment/Plan: ?1. Functional deficits which require 3+ hours per day of interdisciplinary therapy in a comprehensive inpatient rehab setting. ?Physiatrist is providing close team supervision and 24 hour management of active medical problems listed  below. ?Physiatrist and rehab team continue to assess barriers to discharge/monitor patient progress toward functional and medical goals ? ?Care Tool: ? ?Bathing ?   ?Body parts bathed by patient: Right arm, Right lower leg, Left arm, Left lower leg, Chest, Abdomen, Front perineal area, Buttocks, Right upper leg, Left upper leg, Face  ? Body parts bathed by helper: Right arm ?  ?  ?Bathing assist Assist Level: Minimal Assistance - Patient > 75% ?  ?  ?Upper Body Dressing/Undressing ?Upper body dressing   ?What is the patient wearing?: Pull over shirt ?   ?Upper body assist Assist Level: Minimal Assistance - Patient > 75% ?   ?Lower Body Dressing/Undressing ?Lower body dressing ? ? ?   ?What is the patient wearing?: Pants, Incontinence brief ? ?  ? ?Lower body assist Assist for lower body dressing: Moderate Assistance - Patient 50 - 74% ?   ? ?Toileting ?Toileting    ?Toileting assist Assist for toileting: Minimal Assistance - Patient > 75% ?  ?  ?Transfers ?Chair/bed transfer ? ?Transfers assist ?   ? ?Chair/bed transfer assist level: Minimal Assistance - Patient > 75% ?  ?  ?Locomotion ?Ambulation ? ? ?Ambulation assist ? ?   ? ?Assist level: Moderate Assistance - Patient 50 - 74% ?Assistive device: Walker-rolling ?Max  distance: 35f  ? ?Walk 10 feet activity ? ? ?Assist ?   ? ?Assist level: Moderate Assistance - Patient - 50 - 74% ?Assistive device: Hand held assist, Other (comment), Walker-rolling  ? ?Walk 50 feet activity ? ? ?Assist Walk 50 feet with 2 turns activity did not occur: Safety/medical concerns ? ?  ?   ? ? ?Walk 150 feet activity ? ? ?Assist Walk 150 feet activity did not occur: Safety/medical concerns ? ?  ?  ?  ? ?Walk 10 feet on uneven surface  ?activity ? ? ?Assist Walk 10 feet on uneven surfaces activity did not occur: Safety/medical concerns ? ? ?  ?   ? ?Wheelchair ? ? ? ? ?Assist Is the patient using a wheelchair?: Yes ?Type of Wheelchair: Manual ?  ? ?Wheelchair assist level: Dependent -  Patient 0% ?   ? ? ?Wheelchair 50 feet with 2 turns activity ? ? ? ?Assist ? ?  ?  ? ? ?Assist Level: Dependent - Patient 0%  ? ?Wheelchair 150 feet activity  ? ? ? ?Assist ?   ? ? ?Assist Level: Dependent - Patient 0%  ? ?Blood pressure (!) 116/59, pulse 70, temperature 97.7 ?F (36.5 ?C), temperature source Oral, resp. rate 14, height '5\' 6"'$  (1.676 m), weight 89.1 kg, SpO2 97 %. ?Medical Problem List and Plan: ?1. Functional deficits secondary to acute encephalopathy with baseline intellectual disability ?            -patient may shower ?            -ELOS/Goals: 12-14 days ?           -Continue CIR therapies including PT, OT, and SLP   ? -Ambulated 180 feet in PT min walk test with RW ?2.  Antithrombotics: ?-DVT/anticoagulation:  Pharmaceutical: Lovenox ?            -antiplatelet therapy: N/A ?3. Pain Management: Tylenol as needed ?4. Mood: Provide emotional support ?            -antipsychotic agents: N/A ?5. Neuropsych: This patient is not capable of making decisions on his own behalf. ?6. Skin/Wound Care: Routine skin  checks ?7. Fluids/Electrolytes/Nutrition:  ? -appetite seems reasonable so far ?8.  Recurrent aspiration pneumonia.  Antibiotic therapy completed 09/06/2021.  Currently on dysphagia #1 nectar thick liquid diet.  Follow-up speech therapy ? -has occ cough when eating but is easily able to clear.  ? - sitting up at 90 deg for meals ? -family aware of aspiration precautions--palliative care f/u.  ?9.  Hypotension.  ProAmatine 5 mg 3 times daily.  Monitor with increased mobility.    ? -bp controlled 5/6 ? -5/8 B/P well controlled ?10.  Diabetes mellitus.  Latest hemoglobin A1c 6.4.  SSI.  Patient on Glucophage 1000 mg daily prior to admission.  Resume as needed.  ?-CBGs tightly controlled at present ?11.  Normocytic anemia.  Patient with hematology work-up in the past.  Follow-up CBC ? -hgb steadily dropping over last week. ? -had one + test in March, still waiting on stool sample ? -hgb back up to 8.8  5/6 ? -recheck labs tomorrow 5/9 ?12.  Hyperlipidemia.  Patient on Lopid/Lipitor prior to admission.  Currently on hold due to dysphagia ?13. Recurrent hypothermia with unclear cause ?            -Monitor, continue environmental warming as needed ?14. AKI on CKD stage IIIa. improved continue to monitor ? -Cr stable/in range --recheck tuesday  ?15.  GERD. Continue PPI ?16. Bradycardia. Seen by cardiology. Continue to monitor ? -Heart rate remains in 40-50'ss.  Patient asymptomatic ? -5/8 HR 59-70. No symptoms, continue to monitor ? ?LOS: ?5 days ?A FACE TO FACE EVALUATION WAS PERFORMED ? ?Daniel Bailey ?09/12/2021, 7:34 AM  ? ?  ?

## 2021-09-13 LAB — CBC
HCT: 28.9 % — ABNORMAL LOW (ref 39.0–52.0)
Hemoglobin: 9.3 g/dL — ABNORMAL LOW (ref 13.0–17.0)
MCH: 30.9 pg (ref 26.0–34.0)
MCHC: 32.2 g/dL (ref 30.0–36.0)
MCV: 96 fL (ref 80.0–100.0)
Platelets: 291 10*3/uL (ref 150–400)
RBC: 3.01 MIL/uL — ABNORMAL LOW (ref 4.22–5.81)
RDW: 16.7 % — ABNORMAL HIGH (ref 11.5–15.5)
WBC: 4.4 10*3/uL (ref 4.0–10.5)
nRBC: 0 % (ref 0.0–0.2)

## 2021-09-13 LAB — BASIC METABOLIC PANEL
Anion gap: 5 (ref 5–15)
BUN: 23 mg/dL (ref 8–23)
CO2: 31 mmol/L (ref 22–32)
Calcium: 9.1 mg/dL (ref 8.9–10.3)
Chloride: 105 mmol/L (ref 98–111)
Creatinine, Ser: 1.14 mg/dL (ref 0.61–1.24)
GFR, Estimated: >60 mL/min (ref >60)
Glucose, Bld: 76 mg/dL (ref 70–99)
Potassium: 3.9 mmol/L (ref 3.5–5.1)
Sodium: 141 mmol/L (ref 135–145)

## 2021-09-13 LAB — GLUCOSE, CAPILLARY
Glucose-Capillary: 76 mg/dL (ref 70–99)
Glucose-Capillary: 139 mg/dL — ABNORMAL HIGH (ref 70–99)
Glucose-Capillary: 102 mg/dL — ABNORMAL HIGH (ref 70–99)
Glucose-Capillary: 137 mg/dL — ABNORMAL HIGH (ref 70–99)
Glucose-Capillary: 137 mg/dL — ABNORMAL HIGH (ref 70–99)

## 2021-09-13 NOTE — Progress Notes (Signed)
?                                                       PROGRESS NOTE ? ? ?Subjective/Complaints: ?Had a good night. Denies pain. Was up early with PT today.  ? ? ? ?Review of Systems  ?Constitutional: Negative.  Negative for chills, fever and malaise/fatigue.  ?HENT: Negative.  Negative for hearing loss.   ?Respiratory:  Negative for cough.   ?Cardiovascular: Negative.  Negative for chest pain.  ?Gastrointestinal: Negative.  Negative for nausea.  ?Genitourinary: Negative.   ?Skin: Negative.   ?Neurological: Negative.  Negative for dizziness and headaches.  ?Psychiatric/Behavioral: Negative.  Negative for depression.    ? ? ?Objective: ?  ?No results found. ?Recent Labs  ?  09/13/21 ?0543  ?WBC 4.4  ?HGB 9.3*  ?HCT 28.9*  ?PLT 291  ? ? ?Recent Labs  ?  09/13/21 ?0543  ?NA 141  ?K 3.9  ?CL 105  ?CO2 31  ?GLUCOSE 76  ?BUN 23  ?CREATININE 1.14  ?CALCIUM 9.1  ? ? ? ?Intake/Output Summary (Last 24 hours) at 09/13/2021 0909 ?Last data filed at 09/13/2021 0854 ?Gross per 24 hour  ?Intake 404 ml  ?Output 1950 ml  ?Net -1546 ml  ? ?  ? ?  ? ?Physical Exam: ?Vital Signs ?Blood pressure 133/72, pulse (!) 48, temperature 98.3 ?F (36.8 ?C), temperature source Axillary, resp. rate 14, height '5\' 6"'$  (1.676 m), weight 87.6 kg, SpO2 100 %. ? ?Constitutional: No distress . Vital signs reviewed. ?HEENT: NCAT, EOMI, oral membranes moist ?Neck: supple ?Cardiovascular: RRR without murmur. No JVD    ?Respiratory/Chest: CTA Bilaterally without wheezes or rales. Normal effort    ?GI/Abdomen: BS +, non-tender, non-distended ?Ext: no clubbing, cyanosis, or edema ?Psych: pleasant and cooperative  ?Neuro:   Patient is alert and oriented to self.  Speech still dysarthric. Patient was attentive and can follow simple commands.  Moves all 4 extremities. ?Musculoskeletal: Full ROM, No pain with AROM or PROM in the neck, trunk, or extremities. Posture appropriate   ? ? ?Assessment/Plan: ?1. Functional deficits which require 3+ hours per day of  interdisciplinary therapy in a comprehensive inpatient rehab setting. ?Physiatrist is providing close team supervision and 24 hour management of active medical problems listed below. ?Physiatrist and rehab team continue to assess barriers to discharge/monitor patient progress toward functional and medical goals ? ?Care Tool: ? ?Bathing ?   ?Body parts bathed by patient: Right arm, Left arm, Chest, Abdomen, Front perineal area, Buttocks, Right upper leg, Left upper leg, Right lower leg, Left lower leg, Face  ? Body parts bathed by helper: Right arm ?  ?  ?Bathing assist Assist Level: Contact Guard/Touching assist ?  ?  ?Upper Body Dressing/Undressing ?Upper body dressing   ?What is the patient wearing?: Pull over shirt ?   ?Upper body assist Assist Level: Minimal Assistance - Patient > 75% ?   ?Lower Body Dressing/Undressing ?Lower body dressing ? ? ?   ?What is the patient wearing?: Pants, Incontinence brief ? ?  ? ?Lower body assist Assist for lower body dressing: Moderate Assistance - Patient 50 - 74% ?   ? ?Toileting ?Toileting    ?Toileting assist Assist for toileting: Minimal Assistance - Patient > 75% ?  ?  ?Transfers ?Chair/bed transfer ? ?Transfers assist ?   ? ?  Chair/bed transfer assist level: Minimal Assistance - Patient > 75% ?  ?  ?Locomotion ?Ambulation ? ? ?Ambulation assist ? ?   ? ?Assist level: Moderate Assistance - Patient 50 - 74% ?Assistive device: Walker-rolling ?Max distance: 105f  ? ?Walk 10 feet activity ? ? ?Assist ?   ? ?Assist level: Moderate Assistance - Patient - 50 - 74% ?Assistive device: Hand held assist, Other (comment), Walker-rolling  ? ?Walk 50 feet activity ? ? ?Assist Walk 50 feet with 2 turns activity did not occur: Safety/medical concerns ? ?  ?   ? ? ?Walk 150 feet activity ? ? ?Assist Walk 150 feet activity did not occur: Safety/medical concerns ? ?  ?  ?  ? ?Walk 10 feet on uneven surface  ?activity ? ? ?Assist Walk 10 feet on uneven surfaces activity did not occur:  Safety/medical concerns ? ? ?  ?   ? ?Wheelchair ? ? ? ? ?Assist Is the patient using a wheelchair?: Yes ?Type of Wheelchair: Manual ?  ? ?Wheelchair assist level: Dependent - Patient 0% ?   ? ? ?Wheelchair 50 feet with 2 turns activity ? ? ? ?Assist ? ?  ?  ? ? ?Assist Level: Dependent - Patient 0%  ? ?Wheelchair 150 feet activity  ? ? ? ?Assist ?   ? ? ?Assist Level: Dependent - Patient 0%  ? ?Blood pressure 133/72, pulse (!) 48, temperature 98.3 ?F (36.8 ?C), temperature source Axillary, resp. rate 14, height '5\' 6"'$  (1.676 m), weight 87.6 kg, SpO2 100 %. ?Medical Problem List and Plan: ?1. Functional deficits secondary to acute encephalopathy with baseline intellectual disability ? -DNR ?            -patient may shower ?            -ELOS/Goals: 12-14 days ?           -Continue CIR therapies including PT, OT, and SLP. TC today. Dispo discussed ? -Appreciate Palliative Care assist! ?2.  Antithrombotics: ?-DVT/anticoagulation:  Pharmaceutical: Lovenox ?            -antiplatelet therapy: N/A ?3. Pain Management: Tylenol as needed ?4. Mood: Provide emotional support ?            -antipsychotic agents: N/A ?5. Neuropsych: This patient is not capable of making decisions on his own behalf. ?6. Skin/Wound Care: Routine skin  checks ?7. Fluids/Electrolytes/Nutrition:  ? -appetite seems reasonable so far ?8.  Recurrent aspiration pneumonia.  Antibiotic therapy completed 09/06/2021.  Currently on dysphagia #1 nectar thick liquid diet.  Follow-up speech therapy ? -has occ cough when eating but is easily able to clear.  ? - sitting up at 90 deg for meals ? -family aware of aspiration precautions--palliative care f/u.  ?9.  Hypotension.  ProAmatine 5 mg 3 times daily.  Monitor with increased mobility.    ? -bp controlled 5/6 ? -5/9 bp controlled ?10.  Diabetes mellitus.  Latest hemoglobin A1c 6.4.  SSI.  Patient on Glucophage 1000 mg daily prior to admission.  Resume as needed.  ?-CBGs tightly controlled at present ?11.   Normocytic anemia.  Patient with hematology work-up in the past.  Follow-up CBC ? -had one + test in March, still waiting on stool sample ? -5/9 hgb up to 9.3, no s/s blood loss ?12.  Hyperlipidemia.  Patient on Lopid/Lipitor prior to admission.  Currently on hold due to dysphagia ?13. Recurrent hypothermia with unclear cause ?            -  Monitor, continue environmental warming as needed ? -not sure that these temp readings are correct ?14. AKI on CKD stage IIIa. improved continue to monitor ? -Cr stable/in range --recheck tuesday  ?15. GERD. Continue PPI ?16. Bradycardia. Seen by cardiology. Continue to monitor ? -Heart rate remains in 40-50'ss.  Patient asymptomatic ? -5/9 HR stable ? ?LOS: ?6 days ?A FACE TO FACE EVALUATION WAS PERFORMED ? ?Meredith Staggers ?09/13/2021, 9:09 AM  ? ?  ?

## 2021-09-13 NOTE — Plan of Care (Signed)
?  Problem: Consults ?Goal: RH GENERAL PATIENT EDUCATION ?Description: See Patient Education module for education specifics. ?Outcome: Progressing ?Goal: Skin Care Protocol Initiated - if Braden Score 18 or less ?Description: If consults are not indicated, leave blank or document N/A ?Outcome: Progressing ?Goal: Diabetes Guidelines if Diabetic/Glucose > 140 ?Description: If diabetic or lab glucose is > 140 mg/dl - Initiate Diabetes/Hyperglycemia Guidelines & Document Interventions  ?Outcome: Progressing ?  ?Problem: RH BOWEL ELIMINATION ?Goal: RH STG MANAGE BOWEL WITH ASSISTANCE ?Description: STG Manage Bowel with Supervision Assistance. ?Outcome: Progressing ?Goal: RH STG MANAGE BOWEL W/MEDICATION W/ASSISTANCE ?Description: STG Manage Bowel with Medication with Supervision Assistance. ?Outcome: Progressing ?  ?Problem: RH BLADDER ELIMINATION ?Goal: RH STG MANAGE BLADDER WITH ASSISTANCE ?Description: STG Manage Bladder With Supervision Assistance ?Outcome: Progressing ?Goal: RH STG MANAGE BLADDER WITH MEDICATION WITH ASSISTANCE ?Description: STG Manage Bladder With Medication With Supervision Assistance. ?Outcome: Progressing ?  ?Problem: RH SKIN INTEGRITY ?Goal: RH STG MAINTAIN SKIN INTEGRITY WITH ASSISTANCE ?Description: STG Maintain Skin Integrity With Supervision Assistance. ?Outcome: Progressing ?Goal: RH STG ABLE TO PERFORM INCISION/WOUND CARE W/ASSISTANCE ?Description: STG Able To Perform Incision/Wound Care With Supervision Assistance. ?Outcome: Progressing ?  ?Problem: RH SAFETY ?Goal: RH STG ADHERE TO SAFETY PRECAUTIONS W/ASSISTANCE/DEVICE ?Description: STG Adhere to Safety Precautions With Cues and Reminders. ?Outcome: Progressing ?  ?Problem: RH KNOWLEDGE DEFICIT GENERAL ?Goal: RH STG INCREASE KNOWLEDGE OF SELF CARE AFTER HOSPITALIZATION ?Description: Patient and caregivers will demonstrate knowledge of self-care management, bowel/bladder management, skin/wound care with educational materials and handouts  provided by staff with supervision assistance at discharge. ?Outcome: Progressing ?  ?

## 2021-09-13 NOTE — Patient Care Conference (Signed)
Inpatient RehabilitationTeam Conference and Plan of Care Update ?Date: 09/13/2021   Time: 10:02 AM  ? ? ?Patient Name: Daniel Bailey.      ?Medical Record Number: [id number]  ?Date of Birth: 03/09/1951 ?Sex: Male         ?Room/Bed: 4M05C/4M05C-01 ?Payor Info: Payor: MEDICARE / Plan: MEDICARE PART A AND B / Product Type: *No Product type* /   ? ?Admit Date/Time:  09/07/2021 12:57 PM ? ?Primary Diagnosis:  Acute encephalopathy ? ?Hospital Problems: Principal Problem: ?  Acute encephalopathy ? ? ? ?Expected Discharge Date: Expected Discharge Date:  (SNF) ? ?Team Members Present: ?Physician leading conference: Dr. Alger Simons ?Social Worker Present: Loralee Pacas, LCSWA ?Nurse Present: Dorthula Nettles, RN ?PT Present: Apolinar Junes, PT ?OT Present: Mariane Masters, OT ?SLP Present: Weston Anna, SLP ?PPS Coordinator present : Gunnar Fusi, SLP ? ?   Current Status/Progress Goal Weekly Team Focus  ?Bowel/Bladder ? ? In and out cath q8. Incontinent of bowel. Last BM 5/8.  Patient to have regular BM's.  Monitor for changes in bowel and bladder function.   ?Swallow/Nutrition/ Hydration ? ? Dys.1 textures with nectar-thick liquids, Min-Mod A verbal cues for use of swallowing strategies  Min A  tolerance of current diet, utilization of swallowing compensatory strategies   ?ADL's ? ?           ?Mobility ? ? CGA-min A overall, gait up to 180 ft with RW, Berg 29/56  Supervision overall  Functional mobility, activity tolerance, gait training without AD to return to PLOF, balance, safety awareness, patient/caregiver education   ?Communication ? ? CGA-MIN A for mobility; MOD A toileting/LB dressing, impaired initation/sequencing, poor activity tolerance  supervision  balance, BADL retraining, funcitonal transfers, DC planning   ?Safety/Cognition/ Behavioral Observations ?           ?Pain ? ? Denies pain.  Monitor for pain q shift.  Assess for pain q shift and prn.   ?Skin ? ? Skin CDI  No skin breakdown while in rehab.   Monitor skin q shift and prn.   ? ? ?Discharge Planning:  ?Patient lives with his cousin [name] who provides intermittent supervision since he works during the day. Lenon Ahmadi works during day as well. Pt will need to be at an intermittent level of care at time of discharges, otherwise, will require short term rehab-SNF placement.   ?Team Discussion: ?Temperature readings may not be accurate. Baseline bradycardia. Nursing still doing I&O caths, PVR's good. Working on timed toileting schedule. Request that nursing ask patient to go to bathroom as he will not ask on his own. No reported pain. SNF placement as family can't provide 24/7 care.  ? ?Patient on target to meet rehab goals: ?yes, supervision goals. Currently CGA with mobility, supervision bed mobility, tansfers. CGA ambulating 300 ft without RW. Has decreased s/s overt aspiration. Secretions improving. ? ?*See Care Plan and progress notes for long and short-term goals.  ? ?Revisions to Treatment Plan:  ?Adjusting medications. ?  ?Teaching Needs: ?Family education, bladder management, transfer/gait training, etc. ?  ?Current Barriers to Discharge: ?Decreased caregiver support, Home enviroment access/layout, and Lack of/limited family support ? ?Possible Resolutions to Barriers: ?Family education ?SNF placement ?  ? ? Medical Summary ?Current Status: working with speech on swallowing. palliative care involved. aspiration risk. hypothermia erroneous? ? Barriers to Discharge: Medical stability;Behavior ?  ?Possible Resolutions to [employer]: daily assessment of labs and pt data, swallowing cues/supervision ? ? ?Continued Need for Acute Rehabilitation Level of Care:  The patient requires daily medical management by a physician with specialized training in physical medicine and rehabilitation for the following reasons: ?Direction of a multidisciplinary physical rehabilitation program to maximize functional independence : Yes ?Medical management of  patient stability for increased activity during participation in an intensive rehabilitation regime.: Yes ?Analysis of laboratory values and/or radiology reports with any subsequent need for medication adjustment and/or medical intervention. : Yes ? ? ?I attest that I was present, lead the team conference, and concur with the assessment and plan of the team. ? ? ?Dorthula Nettles G ?09/13/2021, 1:20 PM  ? ? ? ? ? ? ?

## 2021-09-13 NOTE — Progress Notes (Signed)
Patient ID: Daniel Hayward., male   DOB: 1950-05-12, 71 y.o.   MRN: [id number] ? ?[id number] spoke with pt cousin Daniel Bailey to provide updates from team conference. She reports that he would not have 24/7 supervision during the day. SW reiterated the reason being primarily safety due to balance deficients causing him to be a high fall risk, and supervision required with meals. She would like to know if any other medical interventions have been done to determine the reason he has issues with swallowing. She is undecided on if she would like to send him to SNF given he has not physically declined. SW encouraged her to explore other safety measures to put in home and see if there is any one that can periodically check-in on him during the day. SW reiterated this would not remove the possibility of injury given he still does require supervision. SW shared will have physician follow-up with her.  ? ?Loralee Pacas, MSW, LCSWA ?Office: [phone] ?Cell: [phone] ?Fax: [phone]  ?

## 2021-09-13 NOTE — Progress Notes (Signed)
Speech Language Pathology Daily Session Note ? ?Patient Details  ?Name: Daniel Bailey. ?MRN: [id number] ?Date of Birth: [birth year] ? ?Today's Date: 09/13/2021 ?SLP Individual Time: 0768-0881 ?SLP Individual Time Calculation (min): 40 min ? ?Short Term Goals: ?Week 1: SLP Short Term Goal 1 (Week 1): Patient will consume current diet with Mod verbal cues for use of swallowing compensatory strategies. ?SLP Short Term Goal 2 (Week 1): Patient will self-monitor and correct wet vocal quality with a throat clear and re-swallow with Mod verbal cues. ?SLP Short Term Goal 3 (Week 1): Patient will recall swallowing compensatory strategies with 75% accuracy with Mod verbal cues. ? ?Skilled Therapeutic Interventions: Skilled treatment session focused on dysphagia goals. SLP facilitated session by providing skilled observation with breakfast meal of Dys. 1 textures with nectar-thick liquids.Patient with intermittent but decreased overt s/s of aspiration today with overall Min verbal cues for use of swallowing compensatory strategies. Recommend patient continue current diet with full supervision to reinforce use of swallowing compensatory strategies. Patient left upright in bed with alarm on and all needs within reach. Continue with current plan of care.  ?   ? ?Pain ?Pain Assessment ?Pain Scale: 0-10 ?Pain Score: 0-No pain ? ?Therapy/Group: Individual Therapy ? ?Daniel Bailey ?09/13/2021, 10:08 AM ?

## 2021-09-13 NOTE — Progress Notes (Signed)
Occupational Therapy Session Note ? ?Patient Details  ?Name: Daniel Pasillas Jr. ?MRN: [id number] ?Date of Birth: 11/11/1950 ? ?Today's Date: 09/13/2021 ?OT Individual Time: 1030-1130 ?OT Individual Time Calculation (min): 60 min  ? ? ?Today's Date: 09/13/2021 ?OT Individual Time: 1406-1445 ?OT Individual Time Calculation (min): 39 min  ? ?Short Term Goals: ?Week 1:  OT Short Term Goal 1 (Week 1): Pt will ambulate with min A to obtain clothing items with RW ?OT Short Term Goal 2 (Week 1): Pt will be able to complete LB clothing with min A including socks and shoes ?OT Short Term Goal 3 (Week 1): Pt will demonstrate improved functional coordination via edema control to manage ADL fasteners with set up ?OT Short Term Goal 4 (Week 1): Pt will demonstrate safe standing balance for 2 grooming tasks sink side with contact guard A ? ?Skilled Therapeutic Interventions/Progress Updates:  ?   ?Pt received in recliner with no pain. ?ADL: ?Pt completes ADL at overall CGA-MIN A Level. Skilled interventions include:VC for hand placement during transitional movements, CGA when up on feet and VC for keeping feet inside RW during turns to surfaces. Pt competles toietling with VC for clothing mamangement and A for thorough hygiene at sit to stand level. Pt able to void on commode and to reach around to cleanse bottom, however not thorough  ? ?Therapeutic activity ?Pt completes bean bag toss in standing position with RW AD for balance and CGA A. Activity performed to improve dynamic balance and functional reach in min-mod ranges outside BOS in prep for BADLs/IADLs. Reacher utilized at amb level to retrieve beanbags from floor and cornhole board with min cuing for positioning of RW in proximity to item. Completed task 2x ? ?1x 5each LE stepping over 4 inch high target to promote larger steps and foot clearance with CGA to MIN A with steadying the walker ? ? ?Pt left at end of session in bed with exit alarm on, call light in reach and all needs  met ? ?Sesion 2: ? ?Pt received in recliner with no pain ?ADL: ?Pt completes ADL at overall CGA-MIN Level. Skilled interventions include: MIN A for doffing UB clothing, min cuing for sequencing bathing sit to stand level with grab bar to wash buttocks with S, MIN A to wash feet seated on TTB in shower. Pt dresses with S for UB and dons pants with MIN A overall. Seated in recliner with extra time pt able ot don socks with set up. Pt overall needs tactile cue for weight shift forward and Vc for hand placement with RW.  ? ? ?Pt left at end of session in recliner with exit alarm on, call light in reach and all needs met ? ? ?Therapy Documentation ?Precautions:  ?Precautions ?Precautions: Fall ?Restrictions ?Weight Bearing Restrictions: No ?General: ?  ? ? ?Therapy/Group: Individual Therapy ? ? M  ?09/13/2021, 7:07 AM ?

## 2021-09-13 NOTE — Progress Notes (Signed)
Physical Therapy Session Note ? ?Patient Details  ?Name: Daniel Bailey. ?MRN: [id number] ?Date of Birth: 06-22-1950 ? ?Today's Date: 09/13/2021 ?PT Individual Time: 7124-5809 ?PT Individual Time Calculation (min): 42 min  ? ?Short Term Goals: ?Week 1:  PT Short Term Goal 1 (Week 1): pt will perform bed mobility with CGA ?PT Short Term Goal 2 (Week 1): pt will transfer bed<>chair with LRAD and CGA ?PT Short Term Goal 3 (Week 1): pt will ambulate 64f with LRAD and min A ? ?Skilled Therapeutic Interventions/Progress Updates:  ?   ?Patient in recliner upon PT arrival. Patient alert and agreeable to PT session. Patient denied pain during session. Patient declined toileting at beginning of session, however, agreeable to attempt at this time. Patient was continent of BM during toileting.  ? ?Therapeutic Activity: ?Bed Mobility: Applied lotion to patient's feet for improved skin integrity, patient reports he gets his toenails cut by a podiatrist, educated on following-up with his podiatrist to cut his toenails after d/c, patient in agreement, will reinforce with caregiver. Donned B TED hose, and socks with total A bed level prior to mobility. Patient performed supine to sit with supervision in a flat bed without use of bed rails. Provided cues for patient to roll through side-lying for reduced effort when coming to sitting. Patient donned pants with mod A in sitting limited by reduced hip flexibility and sitting balance with activity.  ?Transfers: Patient performed an ambulatory toilet transfer with CGA for steadying support without an AD. Patient performed peri-care with set-up assist and lower body clothing management with supervision for cues to pull his pants up in the back. He performed sit to/from stand x3 with supervision without an AD. Provided verbal cues for forward weight shift to reduce mild posterior bias. ? ?Gait Training:  ?Patient ambulated >200 feet x2 without an AD with CGA progressing to close supervision  2x10 feet on second trial. Ambulated with decreased gait speed, decreased step length and height R>L, intermittent crouched gait, forward trunk lean, and downward head gaze. Provided verbal cues for erect posture, looking at a visual target for looking ahead, increased step length and height, and provided motivational music throughout for increased gait speed and distance.  ? ?Patient in recliner in the room at end of session with breaks locked, seat belt alarm set, and all needs within reach.  ? ?Therapy Documentation ?Precautions:  ?Precautions ?Precautions: Fall ?Restrictions ?Weight Bearing Restrictions: No ? ? ? ?Therapy/Group: Individual Therapy ? ?CDoreene BurkePT, DPT ? ?09/13/2021, 3:46 PM  ?

## 2021-09-14 ENCOUNTER — Inpatient Hospital Stay (HOSPITAL_COMMUNITY): Payer: Medicare Other

## 2021-09-14 LAB — GLUCOSE, CAPILLARY
Glucose-Capillary: 100 mg/dL — ABNORMAL HIGH (ref 70–99)
Glucose-Capillary: 65 mg/dL — ABNORMAL LOW (ref 70–99)
Glucose-Capillary: 79 mg/dL (ref 70–99)
Glucose-Capillary: 88 mg/dL (ref 70–99)
Glucose-Capillary: 142 mg/dL — ABNORMAL HIGH (ref 70–99)
Glucose-Capillary: 137 mg/dL — ABNORMAL HIGH (ref 70–99)

## 2021-09-14 NOTE — Progress Notes (Signed)
Physical Therapy Session Note ? ?Patient Details  ?Name: Daniel Bailey. ?MRN: [id number] ?Date of Birth: 1950-05-17 ? ?Today's Date: 09/14/2021 ?PT Individual Time: 1000-1100 ?PT Individual Time Calculation (min): 60 min  ? ?Short Term Goals: ?Week 1:  PT Short Term Goal 1 (Week 1): pt will perform bed mobility with CGA ?PT Short Term Goal 2 (Week 1): pt will transfer bed<>chair with LRAD and CGA ?PT Short Term Goal 3 (Week 1): pt will ambulate 27f with LRAD and min A ? ?Skilled Therapeutic Interventions/Progress Updates:  ?   ?Patient in recliner upon PT arrival. Patient alert and agreeable to PT session. Patient denied pain during session. ? ?Patient reported sudden onset of dizziness while walking and during standing during session. Performed vestibular assessment, without positive findings, vitals WNL for patient. Patient reports hx of dizziness, unable to discern if PTA or just since admission.  ? ? Vestibular Assessment - 09/14/21 0001   ? ?  ? Symptom Behavior  ? Subjective history of current problem dizziness with ambulation and standing, reports it has occured a few times in the hospital   ? Type of Dizziness  Spinning   ? Frequency of Dizziness intermittent   ? Duration of Dizziness 1-2 min   ? Symptom Nature Motion provoked;Intermittent;Variable   ? Aggravating Factors Activity in general;Walking in a crowd   ? Relieving Factors Lying supine;Closing eyes   ? Progression of Symptoms No change since onset   ?  ? Oculomotor Exam  ? Oculomotor Alignment Normal   ? Ocular ROM WFL   ? Spontaneous Absent   ? Gaze-induced  Age appropriate nystagmus at end range   ? Head shaking Horizontal Absent   ? Head Shaking Vertical Absent   ? Smooth Pursuits Saccades   ? Saccades Poor trajectory;Slow   ?  ? Oculomotor Exam-Fixation Suppressed   ? Left Head Impulse negative   ? Right Head Impulse negative   ?  ? Positional Testing  ? Dix-Hallpike Dix-Hallpike Right;Dix-Hallpike Left   ? Horizontal Canal Testing Horizontal Canal  Right;Horizontal Canal Left   ?  ? Dix-Hallpike Right  ? Dix-Hallpike Right Duration absent   ? Dix-Hallpike Right Symptoms No nystagmus   ?  ? Dix-Hallpike Left  ? Dix-Hallpike Left Duration absent   ? Dix-Hallpike Left Symptoms No nystagmus   ?  ? Horizontal Canal Right  ? Horizontal Canal Right Duration absent   ? Horizontal Canal Right Symptoms Normal   ?  ? Horizontal Canal Left  ? Horizontal Canal Left Duration absent   ? Horizontal Canal Left Symptoms Normal   ?  ? Orthostatics  ? BP supine (x 5 minutes) 133/70   ? HR supine (x 5 minutes) 50   ? BP sitting 122/72   ? HR sitting 51   ? BP standing (after 1 minute) 134/79   ? HR standing (after 1 minute) 59   ? BP standing (after 3 minutes) 132/84   ? HR standing (after 3 minutes) 62   ? Orthostatics Comment After ambulating 100 ft: BP 121/82, HR 71   ? ?  ?  ? ?  ? ?Therapeutic Activity: ?Bed Mobility: Patient performed supine to/from sit with supervision on the mat table during vitals assessment. Provided verbal cues for progressing through side-lying to reduce effort with mobility. ?Transfers: Patient performed sit to/from stand x5 with supervision without an AD. Provided verbal cues for forward weight shift and assessment for dizziness when first standing for safety, as patient reported dizziness  with first gait trial and second stand from the mat table, see vitals above. ? ?Gait Training:  ?Patient ambulated >200 feet and >150 feet without an AD with CGA progressing to close supervision 2x10 feet on second trial. Ambulated with decreased gait speed, decreased step length and height R>L, intermittent crouched gait, forward trunk lean, and downward head gaze. Provided verbal cues for erect posture, looking at a visual target for looking ahead, increased step length and height, and provided motivational music throughout for increased gait speed and distance.  ? ?Patient with increased lateral sway and L LOB x1 requiring min A during last 20 feet of first gait  trial. Reported feeling dizzy with LOB.  ? ?Patient in recliner in the room at end of session with breaks locked, seat belt alarm set, and all needs within reach.  ? ?Therapy Documentation ?Precautions:  ?Precautions ?Precautions: Fall ?Restrictions ?Weight Bearing Restrictions: No ? ? ? ?Therapy/Group: Individual Therapy ? ?Doreene Burke PT, DPT ? ?09/14/2021, 12:58 PM  ?

## 2021-09-14 NOTE — Progress Notes (Signed)
Occupational Therapy Session Note ? ?Patient Details  ?Name: Daniel Bailey. ?MRN: [id number] ?Date of Birth: May 21, 1950 ? ?Today's Date: 09/14/2021 ?OT Individual Time: 5110-2111 ?OT Individual Time Calculation (min): 70 min  ? ?Today's Date: 09/14/2021 ?OT Individual Time: 7356-7014 ?OT Individual Time Calculation (min): 40 min  ? ?Short Term Goals: ?Week 1:  OT Short Term Goal 1 (Week 1): Pt will ambulate with min A to obtain clothing items with RW ?OT Short Term Goal 2 (Week 1): Pt will be able to complete LB clothing with min A including socks and shoes ?OT Short Term Goal 3 (Week 1): Pt will demonstrate improved functional coordination via edema control to manage ADL fasteners with set up ?OT Short Term Goal 4 (Week 1): Pt will demonstrate safe standing balance for 2 grooming tasks sink side with contact guard A ? ?Skilled Therapeutic Interventions/Progress Updates:  ?   ?Pt received in bws with no pain  ?ADL: ?Pt completes ADL at overall MIN A Level. Skilled interventions include: cuing to pull shirt up past elbows prior to threading head with MIN A, MIN A for bed mobility/anterior weight shift for sit to stand from EOB, cuing for hand placement on walker/bes/BSC, cuing to adaptive strategy to thread BLE into pants. Pt voids bladder on toilet. ? ?Pt self feeds at beginning of session with NT just passing tray. Pt eats about 50% of tray with min cuing for smaller bites, alternating solids and liquids and initially no issues. Pt demo delayed swallow with liquids and cued for throat clear/swallow per SLP plan. NT alerts OT that pt was given thin liquids last night and to take chest Xray this morning/be aware of s/s of aspiration. Pt then with 3 bites in a row with coughing and OT removes tray d/t pt with increased s/s of aspiration. Let NT and SLP know.  ? ?Therapeutic activity ?Functional mobility in dayroom to practice ambulatory furniture transfers to low couch. Pt requires cing and manual facilitation of anterior  weight shift for scooting to edge and hand placement on arm rest to improve power up. ? ?Pt left at end of session in recliner with exit alarm on, call light in reach and all needs met ? ?Session 2: ? ?Pt received in recliner with no pain and "a little" dizziness ? ?ADL: ?Pt requesting to eat lunch. Pt presented with tray and begins eating with good adherence to swallowing precautions. After some time and intermittent conversation, pt attempting to talk with food in mouth requiring cuing to swallow first and then talk with 2 instances of coughing and wet vocal quality. Pt cued to clear and swallow if talking before resuming eating. Pt with good control of bite size this meal and using double swallow PRN especially after nectar thick liquids. Placed dessert away from pt and alerted NT to finish meal at end of session. ? ? ?Pt left at end of session in bed with exit alarm on, call light in reach and all needs met ? ? ?Therapy Documentation ?Precautions:  ?Precautions ?Precautions: Fall ?Restrictions ?Weight Bearing Restrictions: No ? ? ?Therapy/Group: Individual Therapy ? ?Lowella Dell Cordie Buening ?09/14/2021, 6:55 AM ?

## 2021-09-14 NOTE — Progress Notes (Signed)
Hypoglycemic Event ? ?CBG: 65 ? ?Treatment: 4 oz juice/soda ? ?Symptoms: None ? ?Follow-up CBG: [id number]:[id number] CBG Result:79 ? ?Possible Reasons for Event: Unknown ? ?Comments/MD notified: ? ? ? ? ?[name] ? ? ?

## 2021-09-14 NOTE — Progress Notes (Signed)
?                                                       PROGRESS NOTE ? ? ?Subjective/Complaints: ?No problems overnight. Apparently drank a glass of water unsupervised. Pt comfortable, no cough, sob.  ? ? ? ?Review of Systems  ?Constitutional: Negative.  Negative for chills, fever and malaise/fatigue.  ?HENT: Negative.  Negative for hearing loss.   ?Respiratory:  Negative for cough.   ?Cardiovascular: Negative.  Negative for chest pain.  ?Gastrointestinal: Negative.  Negative for nausea.  ?Genitourinary: Negative.   ?Skin: Negative.   ?Neurological: Negative.  Negative for dizziness and headaches.  ?Psychiatric/Behavioral: Negative.  Negative for depression.    ? ? ?Objective: ?  ?DG Chest Port 1 View ? ?Result Date: 09/14/2021 ?CLINICAL DATA:  Cough EXAM: PORTABLE CHEST 1 VIEW COMPARISON:  09/02/2021 FINDINGS: Heart size and pulmonary vascularity normal. Left lower lobe airspace disease unchanged. Mild progression of right lower lobe airspace disease which is mild. No pleural effusion. IMPRESSION: No change in left lower lobe airspace disease progression of mild right lower lobe airspace disease. Possible pneumonia or atelectasis. Electronically Signed   By: Franchot Gallo M.D.   On: 09/14/2021 08:02   ?Recent Labs  ?  09/13/21 ?0543  ?WBC 4.4  ?HGB 9.3*  ?HCT 28.9*  ?PLT 291  ? ? ?Recent Labs  ?  09/13/21 ?0543  ?NA 141  ?K 3.9  ?CL 105  ?CO2 31  ?GLUCOSE 76  ?BUN 23  ?CREATININE 1.14  ?CALCIUM 9.1  ? ? ? ?Intake/Output Summary (Last 24 hours) at 09/14/2021 0853 ?Last data filed at 09/13/2021 1247 ?Gross per 24 hour  ?Intake 477 ml  ?Output --  ?Net 477 ml  ? ?  ? ?  ? ?Physical Exam: ?Vital Signs ?Blood pressure 132/71, pulse (!) 53, temperature 98 ?F (36.7 ?C), resp. rate 14, height '5\' 6"'$  (1.676 m), weight 87.6 kg, SpO2 99 %. ? ?Constitutional: No distress . Vital signs reviewed. ?HEENT: NCAT, EOMI, oral membranes moist ?Neck: supple ?Cardiovascular: RRR without murmur. No JVD    ?Respiratory/Chest: CTA  Bilaterally without wheezes or rales. Normal effort. No rhonchi or upper airway sounds ?GI/Abdomen: BS +, non-tender, non-distended ?Ext: no clubbing, cyanosis, or edema ?Psych: pleasant and cooperative  ?Neuro:   Patient is alert and oriented to self.  Speech still dysarthric. Patient was attentive and can follow simple commands.  Moves all 4 extremities. ?Musculoskeletal: Full ROM, No pain with AROM or PROM in the neck, trunk, or extremities. Posture appropriate   ? ? ?Assessment/Plan: ?1. Functional deficits which require 3+ hours per day of interdisciplinary therapy in a comprehensive inpatient rehab setting. ?Physiatrist is providing close team supervision and 24 hour management of active medical problems listed below. ?Physiatrist and rehab team continue to assess barriers to discharge/monitor patient progress toward functional and medical goals ? ?Care Tool: ? ?Bathing ?   ?Body parts bathed by patient: Right arm, Left arm, Chest, Abdomen, Front perineal area, Buttocks, Right upper leg, Left upper leg, Right lower leg, Left lower leg, Face  ? Body parts bathed by helper: Right arm ?  ?  ?Bathing assist Assist Level: Contact Guard/Touching assist ?  ?  ?Upper Body Dressing/Undressing ?Upper body dressing   ?What is the patient wearing?: Pull over shirt ?   ?Upper  body assist Assist Level: Minimal Assistance - Patient > 75% ?   ?Lower Body Dressing/Undressing ?Lower body dressing ? ? ?   ?What is the patient wearing?: Pants, Incontinence brief ? ?  ? ?Lower body assist Assist for lower body dressing: Moderate Assistance - Patient 50 - 74% ?   ? ?Toileting ?Toileting    ?Toileting assist Assist for toileting: Minimal Assistance - Patient > 75% ?  ?  ?Transfers ?Chair/bed transfer ? ?Transfers assist ?   ? ?Chair/bed transfer assist level: Minimal Assistance - Patient > 75% ?  ?  ?Locomotion ?Ambulation ? ? ?Ambulation assist ? ?   ? ?Assist level: Moderate Assistance - Patient 50 - 74% ?Assistive device:  Walker-rolling ?Max distance: 5f  ? ?Walk 10 feet activity ? ? ?Assist ?   ? ?Assist level: Moderate Assistance - Patient - 50 - 74% ?Assistive device: Hand held assist, Other (comment), Walker-rolling  ? ?Walk 50 feet activity ? ? ?Assist Walk 50 feet with 2 turns activity did not occur: Safety/medical concerns ? ?  ?   ? ? ?Walk 150 feet activity ? ? ?Assist Walk 150 feet activity did not occur: Safety/medical concerns ? ?  ?  ?  ? ?Walk 10 feet on uneven surface  ?activity ? ? ?Assist Walk 10 feet on uneven surfaces activity did not occur: Safety/medical concerns ? ? ?  ?   ? ?Wheelchair ? ? ? ? ?Assist Is the patient using a wheelchair?: Yes ?Type of Wheelchair: Manual ?  ? ?Wheelchair assist level: Dependent - Patient 0% ?   ? ? ?Wheelchair 50 feet with 2 turns activity ? ? ? ?Assist ? ?  ?  ? ? ?Assist Level: Dependent - Patient 0%  ? ?Wheelchair 150 feet activity  ? ? ? ?Assist ?   ? ? ?Assist Level: Dependent - Patient 0%  ? ?Blood pressure 132/71, pulse (!) 53, temperature 98 ?F (36.7 ?C), resp. rate 14, height '5\' 6"'$  (1.676 m), weight 87.6 kg, SpO2 99 %. ?Medical Problem List and Plan: ?1. Functional deficits secondary to acute encephalopathy with baseline intellectual disability ? -DNR ?            -patient may shower ?            -ELOS/Goals: 12-14 days ?         -Continue CIR therapies including PT, OT, and SLP  ? -Appreciate Palliative Care assist! ?2.  Antithrombotics: ?-DVT/anticoagulation:  Pharmaceutical: Lovenox ?            -antiplatelet therapy: N/A ?3. Pain Management: Tylenol as needed ?4. Mood: Provide emotional support ?            -antipsychotic agents: N/A ?5. Neuropsych: This patient is not capable of making decisions on his own behalf. ?6. Skin/Wound Care: Routine skin  checks ?7. Fluids/Electrolytes/Nutrition:  ? -appetite seems reasonable so far ?8.  Recurrent aspiration pneumonia.  Antibiotic therapy completed 09/06/2021.  Currently on dysphagia #1 nectar thick liquid diet.  Follow-up  speech therapy ? -has occ cough when eating but is easily able to clear.  ? - sitting up at 90 deg for meals ? -family has been made aware of aspiration precautions--palliative care f/u.  ? -5/11 cxr unremarkable. Exam stable---observe today ?9.  Hypotension.  ProAmatine 5 mg 3 times daily.  Monitor with increased mobility.    ? -bp controlled 5/6 ? -5/9 bp controlled ?10.  Diabetes mellitus.  Latest hemoglobin A1c 6.4.  SSI.  Patient on  Glucophage 1000 mg daily prior to admission.  Resume as needed.  ?-CBG (last 3)  ?Recent Labs  ?  09/14/21 ?0814 09/14/21 ?0559 09/14/21 ?4818  ?GLUCAP 65* 79 100*  ? -low cbg's this am without any meds.  ?11.  Normocytic anemia.  Patient with hematology work-up in the past.  Follow-up CBC ? -had one + test in March, still waiting on stool sample ? -5/9 hgb up to 9.3, no s/s blood loss ?12.  Hyperlipidemia.  Patient on Lopid/Lipitor prior to admission.  Currently on hold due to dysphagia ?13. Recurrent hypothermia with unclear cause ?            -Monitor, continue environmental warming as needed ? -not sure that these temp readings are correct ?14. AKI on CKD stage IIIa. improved continue to monitor ? -Cr stable/in range-5/9 ?15. GERD. Continue PPI ?16. Bradycardia. Seen by cardiology. Continue to monitor ? -Heart rate remains in 40-50'ss.  Patient asymptomatic ? -5/10==no change ? ?LOS: ?7 days ?A FACE TO FACE EVALUATION WAS PERFORMED ? ?Meredith Staggers ?09/14/2021, 8:53 AM  ? ?  ?

## 2021-09-14 NOTE — [place] FL2 (Signed)
?[place] MEDICAID FL2 LEVEL OF CARE SCREENING TOOL  ?  ? ?IDENTIFICATION  ?Patient Name: ?Daniel Bailey. Birthdate: [birth year] Sex: male Admission Date (Current Location): ?09/07/2021  ?[place] and [place] Number: ? [place] ?  Facility and Address:  ?[facility], [place] [address], [place], [place] [address] ?     Provider Number: ?[id number]  ?Attending Physician Name and Address:  ?Meredith Staggers, MD ? Relative Name and Phone Number:  ?[name] [phone] ?   ?Current Level of Care: ?Hospital Recommended Level of Care: ?Nursing Facility Prior Approval Number: ?  ? ?Date Approved/Denied: ?  PASRR Number: ?  ? ?Discharge Plan: ?SNF ?  ? ?Current Diagnoses: ?Patient Active Problem List  ? Diagnosis Date Noted  ? Acute encephalopathy 09/02/2021  ? GERD (gastroesophageal reflux disease) 09/02/2021  ? Normal anion gap metabolic acidosis 60/63/0160  ? Hypoalbuminemia 09/02/2021  ? Right bundle branch block 08/12/2021  ? Right ventricular hypertrophy 08/09/2021  ? Bradycardia 08/09/2021  ? Urinary frequency 08/09/2021  ? OSA (obstructive sleep apnea) 07/16/2021  ? Lactic acidosis 07/13/2021  ? Acute metabolic encephalopathy 10/93/2355  ? Aspiration pneumonia ([place])   ? AKI (acute kidney injury) ([place])   ? Decreased GFR 05/26/2021  ? Normocytic anemia 05/26/2021  ? Cerumen impaction 05/17/2021  ? Frequent urination 05/17/2021  ? Tremor 02/09/2021  ? Generalized weakness 02/09/2021  ? Elevated PSA 09/05/2017  ? Leukocytopenia 09/01/2016  ? Mixed hyperlipidemia due to type 2 diabetes mellitus ([place]) 02/19/2014  ? Encounter for Medicare annual wellness exam 08/27/2012  ? Obesity 08/18/2011  ? Colon cancer screening 08/18/2011  ? Routine general medical examination at a health care facility 08/10/2011  ? Prostate cancer screening 08/10/2011  ? Controlled type 2 diabetes mellitus without complication, without long-term current use of insulin ([place]) 05/30/2010  ? HYPERTENSION, BENIGN ESSENTIAL  12/10/2006  ? Intellectual disability 11/23/2006  ? ? ?Orientation RESPIRATION BLADDER Height & Weight   ?  ?Situation, Place, Time, Self ? Normal Continent Weight: 193 lb 2 oz (87.6 kg) ?Height:  '5\' 6"'$  (167.6 cm)  ?BEHAVIORAL SYMPTOMS/MOOD NEUROLOGICAL BOWEL NUTRITION STATUS  ?    Continent Diet (D1 Nectar thick)  ?AMBULATORY STATUS COMMUNICATION OF NEEDS Skin   ?Supervision Verbally Normal ?  ?  ?  ?    ?     ?     ? ? ?Personal Care Assistance Level of Assistance  ?Bathing, Dressing Bathing Assistance: Limited assistance ?  ?Dressing Assistance: Limited assistance ?   ? ?Functional Limitations Info  ?Sight, Speech, Hearing Sight Info: Adequate ?Hearing Info: Adequate ?Speech Info: Adequate  ? ? ?SPECIAL CARE FACTORS FREQUENCY  ?PT (By licensed PT), OT (By licensed OT), Speech therapy   ?  ?PT Frequency: 5xs per week ?OT Frequency: 5xs per week ?  ?  ?Speech Therapy Frequency: 5xs per week ?   ? ? ?Contractures Contractures Info: Not present  ? ? ?Additional Factors Info  ?Code Status, Allergies Code Status Info: DNR ?Allergies Info: NKA ?  ?  ?  ?   ? ?Current Medications (09/14/2021):  This is the current hospital active medication list ?Current Facility-Administered Medications  ?Medication Dose Route Frequency Provider Last Rate Last Admin  ? acetaminophen (TYLENOL) tablet 650 mg  650 mg Oral Q6H PRN Angiulli, Lavon Paganini, PA-C      ? Or  ? acetaminophen (TYLENOL) suppository 650 mg  650 mg Rectal Q6H PRN Angiulli, Lavon Paganini, PA-C      ? enoxaparin (LOVENOX)  injection 40 mg  40 mg Subcutaneous Q24H Cathlyn Parsons, PA-C   40 mg at 09/13/21 1312  ? insulin aspart (novoLOG) injection 0-15 Units  0-15 Units Subcutaneous TID WC Cathlyn Parsons, PA-C   2 Units at 09/13/21 1153  ? insulin aspart (novoLOG) injection 0-5 Units  0-5 Units Subcutaneous QHS Angiulli, Lavon Paganini, PA-C      ? midodrine (PROAMATINE) tablet 5 mg  5 mg Oral TID WC Angiulli, Lavon Paganini, PA-C   5 mg at 09/14/21 1937  ? ondansetron (ZOFRAN) tablet  4 mg  4 mg Oral Q6H PRN Cathlyn Parsons, PA-C      ? Or  ? ondansetron (ZOFRAN) injection 4 mg  4 mg Intravenous Q6H PRN Angiulli, Lavon Paganini, PA-C      ? pantoprazole (PROTONIX) EC tablet 40 mg  40 mg Oral Daily Jennye Boroughs, MD   40 mg at 09/14/21 9024  ? ? ? ?Discharge Medications: ?Please see discharge summary for a list of discharge medications. ? ?Relevant Imaging Results: ? ?Relevant Lab Results: ? ? ?Additional Information ?OX#[id number] ? ?Rana Snare, LCSW ? ? ? ? ?

## 2021-09-14 NOTE — Progress Notes (Signed)
Patient ID: Daniel Bailey., male   DOB: 10-23-1950, 71 y.o.   MRN: [id number] ? ?SW returned phone call to pt cousin/guardian Daniel Bailey who confirmed SNF placement for short term rehab. SW emailed her link for SNF options. SW explained SNF placement process, and this does not guarantee LTC. SW informed referral is for short term rehab. SW encouraged her to continue to apply for LTC Medicaid. Reports she will follow-up with SW once she finished reviewing. Reports she would like [facility] as well.  ? ?NCPASRR referral submitted and escalated to Level II PASRR for mental health screening.  ? ?Bed offer extended by [facility]. SW spoke to [place]/Admissions with [place] to discuss referral. Confirms pt will need to have state screen completed first before pt can be accepted. SW will f/u once completed. SW shared with pt cousin Daniel Bailey bed offer extended and will f/u once more updates.  ? ?Loralee Pacas, MSW, LCSWA ?Office: [phone] ?Cell: [phone] ?Fax: [phone]  ?

## 2021-09-14 NOTE — Progress Notes (Signed)
Speech Language Pathology Daily Session Note ? ?Patient Details  ?Name: Daniel Bailey. ?MRN: [id number] ?Date of Birth: 04-08-1951 ? ?Today's Date: 09/14/2021 ?SLP Individual Time: 1105-1130 ?SLP Individual Time Calculation (min): 25 min ? ?Short Term Goals: ?Week 1: SLP Short Term Goal 1 (Week 1): Patient will consume current diet with Mod verbal cues for use of swallowing compensatory strategies. ?SLP Short Term Goal 2 (Week 1): Patient will self-monitor and correct wet vocal quality with a throat clear and re-swallow with Mod verbal cues. ?SLP Short Term Goal 3 (Week 1): Patient will recall swallowing compensatory strategies with 75% accuracy with Mod verbal cues. ? ?Skilled Therapeutic Interventions: Skilled treatment session focused on dysphagia goals. Upon arrival, patient was awake while upright in the recliner with a mild wet vocal quality that cleared with cues for a throat clear and re-swallow. Patient declined a snack but requested a drink. SLP facilitated session by providing 4 oz of nectar-thick liquids. Patient required min verbal cues for use of swallowing compensatory strategies with only one overt coughing episode noted. Recommend patient continue current diet with full supervision. Of note, patient reported dizziness during session. Nursing aware and vitals taken. All vitals were [facility]. Patient left upright in the recliner with alarm on and all needs within reach. Continue with current plan of care.   ?   ? ?Pain ?No/Denies Pain  ? ?Therapy/Group: Individual Therapy ? ?Daniel Bailey ?09/14/2021, 3:42 PM ?

## 2021-09-15 ENCOUNTER — Other Ambulatory Visit: Payer: Self-pay | Admitting: Family Medicine

## 2021-09-15 LAB — GLUCOSE, CAPILLARY
Glucose-Capillary: 69 mg/dL — ABNORMAL LOW (ref 70–99)
Glucose-Capillary: 75 mg/dL (ref 70–99)
Glucose-Capillary: 113 mg/dL — ABNORMAL HIGH (ref 70–99)
Glucose-Capillary: 114 mg/dL — ABNORMAL HIGH (ref 70–99)
Glucose-Capillary: 139 mg/dL — ABNORMAL HIGH (ref 70–99)

## 2021-09-15 NOTE — Progress Notes (Signed)
Patient rectal temperature 95 F. Patient is asymptomatic, no shivering noted. Resting in bed with call bell within reach. Have continued with plan of care. ?

## 2021-09-15 NOTE — Progress Notes (Signed)
?                                                       PROGRESS NOTE ? ? ?Subjective/Complaints: ?Hypoglycemic this morning again. No other issues ? ? ? ?Review of Systems  ?Constitutional: Negative.  Negative for chills, fever and malaise/fatigue.  ?HENT: Negative.  Negative for hearing loss.   ?Respiratory:  Negative for cough.   ?Cardiovascular: Negative.  Negative for chest pain.  ?Gastrointestinal: Negative.  Negative for nausea.  ?Genitourinary: Negative.   ?Skin: Negative.   ?Neurological: Negative.  Negative for dizziness and headaches.  ?Psychiatric/Behavioral: Negative.  Negative for depression.    ? ? ?Objective: ?  ?DG Chest Port 1 View ? ?Result Date: 09/14/2021 ?CLINICAL DATA:  Cough EXAM: PORTABLE CHEST 1 VIEW COMPARISON:  09/02/2021 FINDINGS: Heart size and pulmonary vascularity normal. Left lower lobe airspace disease unchanged. Mild progression of right lower lobe airspace disease which is mild. No pleural effusion. IMPRESSION: No change in left lower lobe airspace disease progression of mild right lower lobe airspace disease. Possible pneumonia or atelectasis. Electronically Signed   By: Franchot Gallo M.D.   On: 09/14/2021 08:02   ?Recent Labs  ?  09/13/21 ?0543  ?WBC 4.4  ?HGB 9.3*  ?HCT 28.9*  ?PLT 291  ? ? ?Recent Labs  ?  09/13/21 ?0543  ?NA 141  ?K 3.9  ?CL 105  ?CO2 31  ?GLUCOSE 76  ?BUN 23  ?CREATININE 1.14  ?CALCIUM 9.1  ? ? ? ?Intake/Output Summary (Last 24 hours) at 09/15/2021 0906 ?Last data filed at 09/15/2021 0804 ?Gross per 24 hour  ?Intake 600 ml  ?Output 400 ml  ?Net 200 ml  ? ?  ? ?  ? ?Physical Exam: ?Vital Signs ?Blood pressure 114/66, pulse 63, temperature (!) 95.6 ?F (35.3 ?C), resp. rate 18, height '5\' 6"'$  (1.676 m), weight 89.2 kg, SpO2 97 %. ? ?Constitutional: No distress . Vital signs reviewed. ?HEENT: NCAT, EOMI, oral membranes moist ?Neck: supple ?Cardiovascular: RRR without murmur. No JVD    ?Respiratory/Chest: CTA Bilaterally without wheezes or rales. Normal effort     ?GI/Abdomen: BS +, non-tender, non-distended ?Ext: no clubbing, cyanosis, or edema ?Psych: pleasant and cooperative  ?Neuro:   Patient is alert and oriented to self.  Speech clearer. Patient was attentive and can follow simple commands. More awareness. Moves all 4 extremities. ?Musculoskeletal: Full ROM, No pain with AROM or PROM in the neck, trunk, or extremities. Posture appropriate   ? ? ?Assessment/Plan: ?1. Functional deficits which require 3+ hours per day of interdisciplinary therapy in a comprehensive inpatient rehab setting. ?Physiatrist is providing close team supervision and 24 hour management of active medical problems listed below. ?Physiatrist and rehab team continue to assess barriers to discharge/monitor patient progress toward functional and medical goals ? ?Care Tool: ? ?Bathing ?   ?Body parts bathed by patient: Right arm, Left arm, Chest, Abdomen, Front perineal area, Buttocks, Right upper leg, Left upper leg, Right lower leg, Left lower leg, Face  ? Body parts bathed by helper: Right arm ?  ?  ?Bathing assist Assist Level: Contact Guard/Touching assist ?  ?  ?Upper Body Dressing/Undressing ?Upper body dressing   ?What is the patient wearing?: Pull over shirt ?   ?Upper body assist Assist Level: Minimal Assistance - Patient > 75% ?   ?  Lower Body Dressing/Undressing ?Lower body dressing ? ? ?   ?What is the patient wearing?: Pants, Incontinence brief ? ?  ? ?Lower body assist Assist for lower body dressing: Moderate Assistance - Patient 50 - 74% ?   ? ?Toileting ?Toileting    ?Toileting assist Assist for toileting: Minimal Assistance - Patient > 75% ?  ?  ?Transfers ?Chair/bed transfer ? ?Transfers assist ?   ? ?Chair/bed transfer assist level: Minimal Assistance - Patient > 75% ?  ?  ?Locomotion ?Ambulation ? ? ?Ambulation assist ? ?   ? ?Assist level: Moderate Assistance - Patient 50 - 74% ?Assistive device: Walker-rolling ?Max distance: 24f  ? ?Walk 10 feet activity ? ? ?Assist ?   ? ?Assist  level: Moderate Assistance - Patient - 50 - 74% ?Assistive device: Hand held assist, Other (comment), Walker-rolling  ? ?Walk 50 feet activity ? ? ?Assist Walk 50 feet with 2 turns activity did not occur: Safety/medical concerns ? ?  ?   ? ? ?Walk 150 feet activity ? ? ?Assist Walk 150 feet activity did not occur: Safety/medical concerns ? ?  ?  ?  ? ?Walk 10 feet on uneven surface  ?activity ? ? ?Assist Walk 10 feet on uneven surfaces activity did not occur: Safety/medical concerns ? ? ?  ?   ? ?Wheelchair ? ? ? ? ?Assist Is the patient using a wheelchair?: Yes ?Type of Wheelchair: Manual ?  ? ?Wheelchair assist level: Dependent - Patient 0% ?   ? ? ?Wheelchair 50 feet with 2 turns activity ? ? ? ?Assist ? ?  ?  ? ? ?Assist Level: Dependent - Patient 0%  ? ?Wheelchair 150 feet activity  ? ? ? ?Assist ?   ? ? ?Assist Level: Dependent - Patient 0%  ? ?Blood pressure 114/66, pulse 63, temperature (!) 95.6 ?F (35.3 ?C), resp. rate 18, height '5\' 6"'$  (1.676 m), weight 89.2 kg, SpO2 97 %. ?Medical Problem List and Plan: ?1. Functional deficits secondary to acute encephalopathy with baseline intellectual disability ? -DNR ?            -patient may shower ?            -ELOS/Goals: 12-14 days ?         -Continue CIR therapies including PT, OT, and SLP. SNF pending ? -Appreciate Palliative Care assist! ?2.  Antithrombotics: ?-DVT/anticoagulation:  Pharmaceutical: Lovenox ?            -antiplatelet therapy: N/A ?3. Pain Management: Tylenol as needed ?4. Mood: Provide emotional support ?            -antipsychotic agents: N/A ?5. Neuropsych: This patient is not capable of making decisions on his own behalf. ?6. Skin/Wound Care: Routine skin  checks ?7. Fluids/Electrolytes/Nutrition:  ? -appetite seems reasonable so far ?8.  Recurrent aspiration pneumonia.  Antibiotic therapy completed 09/06/2021.  Currently on dysphagia #1 nectar thick liquid diet.  Follow-up speech therapy ? -has occ cough when eating but is easily able to clear.   ? - sitting up at 90 deg for meals ? -family has been made aware of aspiration precautions--palliative care f/u.  ? -5/11 cxr unremarkable. Lungs remain clear ?9.  Hypotension.  ProAmatine 5 mg 3 times daily.  Monitor with increased mobility.    ? -bp controlled 5/6 ? -5/11 bp controlled ?10.  Diabetes mellitus.  Latest hemoglobin A1c 6.4.  SSI.  Patient on Glucophage 1000 mg daily prior to admission.  Resume as needed.  ?-  CBG (last 3)  ?Recent Labs  ?  09/14/21 ?2113 09/15/21 ?0603 09/15/21 ?0636  ?GLUCAP 137* 69* 75  ? ? -low cbg's in am. No meds currently. Regular diet ? -add hs snack ?11.  Normocytic anemia.  Patient with hematology work-up in the past.  Follow-up CBC ? -had one + test in March, still waiting on stool sample ? -5/9 hgb up to 9.3, no s/s blood loss ?12.  Hyperlipidemia.  Patient on Lopid/Lipitor prior to admission.  Currently on hold due to dysphagia ?13. Recurrent hypothermia with unclear cause ?            -Monitor, continue environmental warming as needed ? -not sure that these temp readings are accurate ?14. AKI on CKD stage IIIa. improved continue to monitor ? -Cr stable/in range-5/9 ?15. GERD. Continue PPI ?16. Bradycardia. Seen by cardiology. Continue to monitor ? -Heart rate remains in 40-50'ss.  Patient asymptomatic ? -5/11==no change ? ?LOS: ?8 days ?A FACE TO FACE EVALUATION WAS PERFORMED ? ?Meredith Staggers ?09/15/2021, 9:06 AM  ? ?  ?

## 2021-09-15 NOTE — Progress Notes (Signed)
Speech Language Pathology Daily Session Note ? ?Patient Details  ?Name: Daniel Bailey. ?MRN: [id number] ?Date of Birth: 02-15-1951 ? ?Today's Date: 09/15/2021 ?SLP Individual Time: 9311-2162 ?SLP Individual Time Calculation (min): 24 min ? ?Short Term Goals: ?Week 1: SLP Short Term Goal 1 (Week 1): Patient will consume current diet with Mod verbal cues for use of swallowing compensatory strategies. ?SLP Short Term Goal 2 (Week 1): Patient will self-monitor and correct wet vocal quality with a throat clear and re-swallow with Mod verbal cues. ?SLP Short Term Goal 3 (Week 1): Patient will recall swallowing compensatory strategies with 75% accuracy with Mod verbal cues. ? ?Skilled Therapeutic Interventions: Skilled treatment session focused on dysphagia goals. Upon arrival, patient was awake while upright in the recliner. Patient declined pureed textures but agreeable to nectar-thick liquids. Patient consumed 4 oz of nectar-thick liquids without overt coughing but with an intermittent wet vocal quality noted that cleared with a cued throat clear. Overall, patient required Min verbal cues for use of swallowing compensatory strategies. Recommend patient continue current diet. Patient left upright in recliner with alarm on and all needs within reach. Continue with current plan of care.  ?   ? ?Pain ?No/Denies Pain  ? ?Therapy/Group: Individual Therapy ? ?Daniel Bailey ?09/15/2021, 12:28 PM ?

## 2021-09-15 NOTE — Progress Notes (Signed)
Patient ID: Daniel Bailey., male   DOB: 09/15/1950, 71 y.o.   MRN: [id number] ? ?SW received phone call from [place] reporting she is going to around to look at SNFs and will follow-up once she confirms her preference.  ? ?Loralee Pacas, MSW, LCSWA ?Office: [phone] ?Cell: [phone] ?Fax: [phone]  ? ? ?

## 2021-09-15 NOTE — Progress Notes (Signed)
Occupational Therapy Weekly Progress Note ? ?Patient Details  ?Name: Daniel Bailey. ?MRN: [id number] ?Date of Birth: 06/22/1950 ? ?Beginning of progress report period: Sep 08, 2021 ?End of progress report period: Sep 15, 2021 ? ?Today's Date: 09/15/2021 ?OT Individual Time: 0459-9774 ?OT Individual Time Calculation (min): 60 min  ? ? ?Patient has met 4 of 4 short term goals.  Pt is making good progress towards OT goals improving to near supervision intermittent CGA level for all transfers and BADLs. Pt continues to be deconditioned and need increased time for sequencing and implementing adaptive strategies with cuing. Pt family has decided to pusue SNF placement to continue to work towards a MOD I level.  ? ?Patient continues to demonstrate the following deficits: muscle weakness, decreased cardiorespiratoy endurance, impaired timing and sequencing, unbalanced muscle activation, and decreased coordination, decreased initiation, decreased attention, decreased awareness, decreased problem solving, decreased safety awareness, decreased memory, and delayed processing, and decreased sitting balance, decreased standing balance, decreased postural control, and decreased balance strategies and therefore will continue to benefit from skilled OT intervention to enhance overall performance with BADL and Reduce care partner burden. ? ?Patient progressing toward long term goals..  Continue plan of care. ? ?OT Short Term Goals ?Week 1:  OT Short Term Goal 1 (Week 1): Pt will ambulate with min A to obtain clothing items with RW ?OT Short Term Goal 1 - Progress (Week 1): Met ?OT Short Term Goal 2 (Week 1): Pt will be able to complete LB clothing with min A including socks and shoes ?OT Short Term Goal 2 - Progress (Week 1): Met ?OT Short Term Goal 3 (Week 1): Pt will demonstrate improved functional coordination via edema control to manage ADL fasteners with set up ?OT Short Term Goal 3 - Progress (Week 1): Met ?OT Short Term Goal 4  (Week 1): Pt will demonstrate safe standing balance for 2 grooming tasks sink side with contact guard A ?OT Short Term Goal 4 - Progress (Week 1): Met ?Week 2:  OT Short Term Goal 1 (Week 2): STG=LTG d/t ELOS ? ?Skilled Therapeutic Interventions/Progress Updates:  ?   ?Pt received in bed with no pain. ? ?ADL: ?Pt completes ADL at overall supervision-MIN A Level. Skilled interventions include: cuing for use of reacher for doffing and donning of clothing over feet, rearrangement of bathroom equipment to allow for improved forward bed towards feet for bathing, cuing (less than usual) for hand placement during transitional movements/ambulatory transfers with RW, and increased time to sequence through BADL tasks. Overall pt continues to require MIN A for thorough posterior hygiene after toileting.  ? ?Pt left at end of session in bed with exit alarm on, call light in reach and all needs met ? ? ?Therapy Documentation ?Precautions:  ?Precautions ?Precautions: Fall ?Restrictions ?Weight Bearing Restrictions: No ?General: ?  ? ? ?Therapy/Group: Individual Therapy ? ?Lowella Dell Watson [place] ?09/15/2021, 6:53 AM  ?

## 2021-09-15 NOTE — Progress Notes (Addendum)
Physical Therapy Weekly Progress Note ? ?Patient Details  ?Name: Daniel Bailey. ?MRN: [id number] ?Date of Birth: 1950-09-24 ? ?Beginning of progress report period: Sep 08, 2021 ?End of progress report period: Sep 15, 2021 ? ?Today's Date: 09/15/2021 ?PT Individual Time:1018-1103 and 1300-1400 ?PT Individual Time Calculation (min): 60 min  ? ?Patient has met 3 of 3 short term goals.  Patient with good progress this week. Patient progressed to supervision for bed mobility and transfers, CGA with intermittent min A for gait >200 ft without an AD, and 4 steps with B rails for community access and strengthening. Patient improved scores on outcome measures: ?Berg Balance Test: 5/7: 29/56; 5/11: 38/56 ?5 Time Sit to Stand: 5/7: 0, 5/11: 28 sec ?Patients caregiver has been educated on need for 24/7 supervision for patient due to patient's increased fall risk at home. Caregiver is unable to provide this at this time. Patient is pending SNF placement to continue skilled therapy services with appropriate supervision levels for safety.  ? ?Patient continues to demonstrate the following deficits muscle weakness and muscle joint tightness, decreased cardiorespiratoy endurance, decreased coordination, decreased safety awareness and decreased memory, and decreased standing balance, decreased postural control, and decreased balance strategies and therefore will continue to benefit from skilled PT intervention to increase functional independence with mobility. ? ?Patient progressing toward long term goals..  Continue plan of care. ? ?PT Short Term Goals ?Week 1:  PT Short Term Goal 1 (Week 1): pt will perform bed mobility with CGA ?PT Short Term Goal 1 - Progress (Week 1): Met ?PT Short Term Goal 2 (Week 1): pt will transfer bed<>chair with LRAD and CGA ?PT Short Term Goal 2 - Progress (Week 1): Met ?PT Short Term Goal 3 (Week 1): pt will ambulate 24ft with LRAD and min A ?PT Short Term Goal 3 - Progress (Week 1): Met ?Week 2:  PT Short  Term Goal 1 (Week 2): STG=LTG due to patient pending SNF placement. ? ?Skilled Therapeutic Interventions/Progress Updates:  ?   ?Session 1: ?Patient in recliner upon PT arrival. Patient alert and agreeable to PT session. Patient denied pain during session. Patient reports that he went to the bathroom with OT this morning and declined during session. Encouraged patient to go with staff throughout the day.  ? ?Patient provided a nectar thick cranberry juice per patient request during session. Provided cues for a dry swallow prior to drinking and intermittently between small sips. Patient able to recall cues throughout. Patient independently performed an appropriate throat clear upon finishing the juice.  ? ?Focused session on reassessment of outcome measures and gait training.  ? ?Therapeutic Activity: ?Transfers: Patient performed sit to/from stand x7 with supervision throughout session. Provided verbal cues for forward weight shift and erect posture when coming to standing with cues for gluteal activation. ? ?Gait Training:  ?Patient ambulated >150 feet x2 without an AD with CGA progressing to close supervision 2x10 feet on second trial. Ambulated with decreased gait speed, decreased step length and height R>L, intermittent crouched gait, forward trunk lean, and downward head gaze. Provided verbal cues for erect posture, looking at a visual target for looking ahead, increased step length and height, and provided motivational music throughout for increased gait speed and distance.  ? ?Neuromuscular Re-ed: ?Berg Balance Test ?Sit to Stand: Able to stand without using hands and stabilize independently ?Standing Unsupported: Able to stand safely 2 minutes ?Sitting with Back Unsupported but Feet Supported on Floor or Stool: Able to sit safely and securely 2  minutes ?Stand to Sit: Sits safely with minimal use of hands ?Transfers: Able to transfer safely, minor use of hands ?Standing Unsupported with Eyes Closed: Able to  stand 10 seconds safely ?Standing Ubsupported with Feet Together: Needs help to attain position and unable to hold for 15 seconds ?From Standing, Reach Forward with Outstretched Arm: Reaches forward but needs supervision ?From Standing Position, Pick up Object from Floor: Able to pick up shoe, needs supervision ?From Standing Position, Turn to Look Behind Over each Shoulder: Looks behind from both sides and weight shifts well ?Turn 360 Degrees: Able to turn 360 degrees safely but slowly ?Standing Unsupported, Alternately Place Feet on Step/Stool: Able to complete >2 steps/needs minimal assist ?Standing Unsupported, One Foot in Front: Able to take small step independently and hold 30 seconds ?Standing on One Leg: Tries to lift leg/unable to hold 3 seconds but remains standing independently ?Total Score: 38/56 (improved from 29/56 on 5/7, meeting the [facility]) ?Patient demonstrated increased fall risk noted by score of 38/56 on the [facility] Scale.  ?<45/56 = fall risk, <42/56 = predictive of recurrent falls, <40/56 = 100% fall risk  ?>41 = independent, 21-40 = assistive device, 0-20 = wheelchair level  ?MDC 6.9 (4 pts 45-56, 5 pts 35-44, 7 pts 25-34) ?(ANPTA Core Set of Outcome Measures for Adults with Neurologic Conditions, 2018) ? ?Five times Sit to Stand Test (FTSS) ?Method: ?Use a straight back chair with a solid seat that is 17-18? high. Ask participant to sit on the chair with arms folded across their chest.   ?Instructions: ??Stand up and sit down as quickly as possible 5 times, keeping your arms folded across your chest.?   ?Measurement: ?Stop timing when the participant touches the chair in sitting the 5th time. ?TIME: 28 sec (improved from 0 score on admission) ?Cut off scores indicative of increased fall risk: >12 sec CVA, >16 sec PD, >13 sec vestibular ?(ANPTA Core Set of Outcome Measures for Adults with Neurologic Conditions, 2018) ? ?Educated patient on results and improvement with interpretation of  results following assessments. Patient pleased with making progress today.  ? ?Patient in recliner at end of session with breaks locked, seat belt alarm set, and all needs within reach.  ? ?Session 2: ?Patient in recliner with NT in the room upon PT arrival. Patient alert and agreeable to PT session. Patient denied pain during session. ? ?Patient reported fatigue this afternoon, however was agreeable and excited to spend initial part of the session outside for improved affect as patient reports he enjoys being outside at home.  ? ?Patient's caregiver, Ms. Ola Spurr, was in the room upon patient return. She had questions about SNF options, deferred to CSW, and about the patient's low temperature, deferred to LPN and both parties made aware of her questions during session.  ? ?Therapeutic Activity: ?Transfers: Patient performed sit to/from stand x5 and stand pivot recliner<>w/c with supervision without an AD. Provided verbal cues for forward weight shift x3. ? ?Gait Training:  ?Patient ambulated >150 feet over unlevel surfaces with CGA and intermittent min A without an AD. He then ambulated 2x 100 feet with increased facilitation for forward propulsion for increased gait speed and step length with min A without an AD. Focused on erect posture, looking ahead, increased gait speed and step length, and increased R step height (more noticeable on sidewalk than tile floor). ? ?Neuromuscular Re-ed: ?-side stepping 20 feet R/L x3 ?-standing reaching cross body with min-mod A for reach and balance due to posterior  bias when reaching x2 min ? ?Played patient selected music for increased motivation and improved arousal throughout session. Patient required increased time and rest breaks with activity due to fatigue level this afternoon.  ? ?Patient eventually reporting increased fatigue and returned to room. Patient falling asleep during discussion with caregiver, see above. Patient missed 15 min of skilled PT due to fatigue,  RN made aware. Will attempt to make-up missed time as able.   ? ?Patient in recliner with Ms. Fitzgerald in the room at end of session with breaks locked, seat belt alarm set, and all needs within reach.  ? ?

## 2021-09-15 NOTE — Progress Notes (Signed)
Hypoglycemic Event ? ?CBG: 69 ? ?Treatment: 4 oz juice/soda ? ?Symptoms: None ? ?Follow-up CBG: [id number] CBG Result:75 ? ?Possible Reasons for Event: Unknown ? ?Comments/MD notified:  ? ? ? ?[name] ? ? ?

## 2021-09-16 LAB — GLUCOSE, CAPILLARY
Glucose-Capillary: 75 mg/dL (ref 70–99)
Glucose-Capillary: 102 mg/dL — ABNORMAL HIGH (ref 70–99)
Glucose-Capillary: 119 mg/dL — ABNORMAL HIGH (ref 70–99)
Glucose-Capillary: 133 mg/dL — ABNORMAL HIGH (ref 70–99)

## 2021-09-16 NOTE — Progress Notes (Signed)
Physical Therapy Session Note ? ?Patient Details  ?Name: Daniel Bailey. ?MRN: [id number] ?Date of Birth: April 28, 1951 ? ?Today's Date: 09/16/2021 ?PT Individual Time: 1445-1530 ?PT Individual Time Calculation (min): 45 min  ? ?Short Term Goals: ?Week 1:  PT Short Term Goal 1 (Week 1): pt will perform bed mobility with CGA ?PT Short Term Goal 1 - Progress (Week 1): Met ?PT Short Term Goal 2 (Week 1): pt will transfer bed<>chair with LRAD and CGA ?PT Short Term Goal 2 - Progress (Week 1): Met ?PT Short Term Goal 3 (Week 1): pt will ambulate 89ft with LRAD and min A ?PT Short Term Goal 3 - Progress (Week 1): Met ?Week 2:  PT Short Term Goal 1 (Week 2): STG=LTG due to patient pending SNF placement. ?Week 3:    ? ?Skilled Therapeutic Interventions/Progress Updates:  ? Pt denies pain.  Initially supine.  Supine to sit w/supervision, use of bed rail, hob elevated. ?Scooting in sitting min assist and heavy cueing for sequencing, unable to boost/instructed w/scooting one side at time, task requires additional time. ?Stand pivot bed to wc w/cueing to promote ant wt shift w/transition, cga, relies on hands w/transfer. ? ?CGA with intermittent min A for gait 150 ft without an AD, increased lateral trunk excursions, toe out, w/fatigue balance loss increases, mild unsteadiness/mild buckling L knee? - pt denied pain but seemed as if sudden pain in knee resulted in instability. ? ?Worked on sit to stand from wc w/hands on knees x 10 reps, cues to assume full upright posture, performed for functional strengthening/balance challenge ? ?Ascended/descended  4 steps with B rails w/cga, cues for upright posture, performed for community access and functional strengthening.  Repeated above following seated rest. ? ?Gait 180ft w/cga only, no lob, cues for upright posture and gaze, reaches for handrail in hallway at times. ? ?Pt reted in recliner.  Recliner to wc stand pivot transfer w/supervision, use of hands.  Pt left oob in wc w/alarm belt set  and needs in reach ? ? ? ?Therapy Documentation ?Precautions:  ?Precautions ?Precautions: Fall ?Restrictions ?Weight Bearing Restrictions: No ? ? ?Therapy/Group: Individual Coral Ceo, PT ? ? ?[name] ?09/16/2021, 3:16 PM  ?

## 2021-09-16 NOTE — Progress Notes (Signed)
Occupational Therapy Session Note ? ?Patient Details  ?Name: Daniel Bailey. ?MRN: [id number] ?Date of Birth: [birth year] ? ?Today's Date: 09/16/2021 ?OT Individual Time: 5041-3643 ?OT Individual Time Calculation (min): 71 min  ? ? ?Short Term Goals: ?Week 1:  OT Short Term Goal 1 (Week 1): Pt will ambulate with min A to obtain clothing items with RW ?OT Short Term Goal 1 - Progress (Week 1): Met ?OT Short Term Goal 2 (Week 1): Pt will be able to complete LB clothing with min A including socks and shoes ?OT Short Term Goal 2 - Progress (Week 1): Met ?OT Short Term Goal 3 (Week 1): Pt will demonstrate improved functional coordination via edema control to manage ADL fasteners with set up ?OT Short Term Goal 3 - Progress (Week 1): Met ?OT Short Term Goal 4 (Week 1): Pt will demonstrate safe standing balance for 2 grooming tasks sink side with contact guard A ?OT Short Term Goal 4 - Progress (Week 1): Met ? ?Skilled Therapeutic Interventions/Progress Updates:  ?   ?Pt received in bed with nopain. Vitals monitored throughout session with no significant change. VSS ?ADL: ?Pt completes ADL at overall supervision-CGA Level. Skilled interventions include: lowered TTB to improve anterior reach to feet for bathing especially RLE. Pt able to cross LLE into figure 4. Pt requires MOD VC for sequencing. Pt completes UB dressing with cuing to push sleeves past elbows. Supervision for LB dressing except for teds. Cuing to cross LLE into figure 4 to thread and push pants down to ankle to place RLE into pants then pull up to knee. Pt able to don B socks with setup. Pt requires rest breaks d/t decreased activity tolerance  ? ?Therapeutic activity ?Pt completes functional mobility to ADL apartment to transfer into recliner with S to CGA (for turning) with VC for keeping feet inside RW/proximity to RW. ? ?Pt completes kitchen search into various height cabinets/appliaces in prep for IADL retraining from ambulatory level with mod cuing for  safety/positioning throughout environment. Pt usesstrategies with MOD Cuing improve reach/safety and transportation of items with education on energy conservation techniques throughout activity ? ? ?Pt left at end of session in recliner with exit alarm on, call light in reach and all needs met ? ? ?Therapy Documentation ?Precautions:  ?Precautions ?Precautions: Fall ?Restrictions ?Weight Bearing Restrictions: No ?General: ?  ? ? ?Therapy/Group: Individual Therapy ? ?[name] ?09/16/2021, 6:47 AM ?

## 2021-09-16 NOTE — Progress Notes (Signed)
Speech Language Pathology Weekly Progress and Session Note ? ?Patient Details  ?Name: Daniel Bailey. ?MRN: [id number] ?Date of Birth: October 26, 1950 ? ?Beginning of progress report period: Sep 09, 2021 ?End of progress report period: Sep 16, 2021 ? ?Today's Date: 09/16/2021 ?SLP Individual Time: 4097-3532 ?SLP Individual Time Calculation (min): 28 min ? ?Short Term Goals: ?Week 1: SLP Short Term Goal 1 (Week 1): Patient will consume current diet with Mod verbal cues for use of swallowing compensatory strategies. ?SLP Short Term Goal 1 - Progress (Week 1): Met ?SLP Short Term Goal 2 (Week 1): Patient will self-monitor and correct wet vocal quality with a throat clear and re-swallow with Mod verbal cues. ?SLP Short Term Goal 2 - Progress (Week 1): Met ?SLP Short Term Goal 3 (Week 1): Patient will recall swallowing compensatory strategies with 75% accuracy with Mod verbal cues. ?SLP Short Term Goal 3 - Progress (Week 1): Met ? ?  ?New Short Term Goals: ?Week 2: SLP Short Term Goal 1 (Week 2): Patient will consume current diet with Min verbal cues for use of swallowing compensatory strategies. ?SLP Short Term Goal 2 (Week 2): Patient will self-monitor and correct wet vocal quality with a throat clear and re-swallow with Min verbal cues. ?SLP Short Term Goal 3 (Week 2): Patient will recall swallowing compensatory strategies with 75% accuracy with Min verbal cues. ? ?Weekly Progress Updates: Patient has made functional gains and has met 3 of 3 STGs this reporting period. Currently, patient demonstrates improved management of secretions and requires Min-Mod A multimodal cues to self-monitor and correct his intermittent wet vocal quality. Patient is consuming Dys. 1 textures with nectar-thick liquids via cup with intermittent but decreased overt s/s of aspiration with Min-Mod A verbal cues needed for recall and utilization of swallowing compensatory strategies. Recommend patient continue on current diet. Patient and family education  ongoing. Patient would benefit from continued skilled SLP intervention to maximize his swallowing function prior  to discharge.  ?  ? ?Intensity: Minumum of 1-2 x/day, 30 to 90 minutes ?Frequency: 3 to 5 out of 7 days ?Duration/Length of Stay: TBD due to SNF placement ?Treatment/Interventions: Dysphagia/aspiration precaution training;Environmental controls;Cueing hierarchy;Functional tasks;Patient/family education;Therapeutic Activities ? ? ?Daily Session ? ?Skilled Therapeutic Interventions:  Skilled treatment session focused on dysphagia goals. Upon arrival, patient was asleep in bed but easily roused and agreeable to treatment session. Patient had recently completed his lunch but requested nectar-thick liquids. Patient recalled swallowing compensatory strategies with Min verbal cues and utilized strategies with overall supervision level verbal cues. Patient without overt s/s of aspiration while consuming 4 oz of liquid. Recommend patient continue current diet. Patient left upright in bed with alarm on and all needs within reach. Continue with current plan of care.  ? ?Pain ?No/Denies Pain  ? ?Therapy/Group: Individual Therapy ? ?Daniel Bailey ?09/16/2021, 6:19 AM ? ? ? ? ? ? ?

## 2021-09-16 NOTE — Progress Notes (Signed)
?                                                       PROGRESS NOTE ? ? ?Subjective/Complaints: ?Sugar still a little low but only 75 this am. No new issues reported ? ? ? ? ?Review of Systems  ?Constitutional: Negative.  Negative for chills, fever and malaise/fatigue.  ?HENT: Negative.  Negative for hearing loss.   ?Respiratory:  Negative for cough.   ?Cardiovascular: Negative.  Negative for chest pain.  ?Gastrointestinal: Negative.  Negative for nausea.  ?Genitourinary: Negative.   ?Skin: Negative.   ?Neurological: Negative.  Negative for dizziness and headaches.  ?Psychiatric/Behavioral: Negative.  Negative for depression.    ? ? ?Objective: ?  ?No results found. ?No results for input(s): WBC, HGB, HCT, PLT in the last 72 hours. ? ?No results for input(s): NA, K, CL, CO2, GLUCOSE, BUN, CREATININE, CALCIUM in the last 72 hours. ? ? ?Intake/Output Summary (Last 24 hours) at 09/16/2021 0915 ?Last data filed at 09/16/2021 0700 ?Gross per 24 hour  ?Intake 720 ml  ?Output 800 ml  ?Net -80 ml  ? ?  ? ?  ? ?Physical Exam: ?Vital Signs ?Blood pressure 118/65, pulse (!) 59, temperature (!) 95.2 ?F (35.1 ?C), resp. rate 16, height '5\' 6"'$  (1.676 m), weight 89.2 kg, SpO2 100 %. ? ?Constitutional: No distress . Vital signs reviewed. ?HEENT: NCAT, EOMI, oral membranes moist ?Neck: supple ?Cardiovascular: RRR without murmur. No JVD    ?Respiratory/Chest: CTA Bilaterally without wheezes or rales. Normal effort    ?GI/Abdomen: BS +, non-tender, non-distended ?Ext: no clubbing, cyanosis, or edema ?Psych: pleasant and cooperative  ?Neuro:   Patient is alert and oriented to self.  Speech clearer. Patient was attentive and can follow simple commands. More awareness. Moves all 4 extremities without limitations. ?Musculoskeletal: Full ROM, No pain with AROM or PROM in the neck, trunk, or extremities. Posture appropriate   ? ? ?Assessment/Plan: ?1. Functional deficits which require 3+ hours per day of interdisciplinary therapy in a  comprehensive inpatient rehab setting. ?Physiatrist is providing close team supervision and 24 hour management of active medical problems listed below. ?Physiatrist and rehab team continue to assess barriers to discharge/monitor patient progress toward functional and medical goals ? ?Care Tool: ? ?Bathing ?   ?Body parts bathed by patient: Right arm, Left arm, Chest, Abdomen, Front perineal area, Buttocks, Right upper leg, Left upper leg, Right lower leg, Left lower leg, Face  ? Body parts bathed by helper: Right arm ?  ?  ?Bathing assist Assist Level: Contact Guard/Touching assist ?  ?  ?Upper Body Dressing/Undressing ?Upper body dressing   ?What is the patient wearing?: Pull over shirt ?   ?Upper body assist Assist Level: Minimal Assistance - Patient > 75% ?   ?Lower Body Dressing/Undressing ?Lower body dressing ? ? ?   ?What is the patient wearing?: Pants, Incontinence brief ? ?  ? ?Lower body assist Assist for lower body dressing: Moderate Assistance - Patient 50 - 74% ?   ? ?Toileting ?Toileting    ?Toileting assist Assist for toileting: Minimal Assistance - Patient > 75% ?  ?  ?Transfers ?Chair/bed transfer ? ?Transfers assist ?   ? ?Chair/bed transfer assist level: Minimal Assistance - Patient > 75% ?  ?  ?Locomotion ?Ambulation ? ? ?Ambulation assist ? ?   ? ?  Assist level: Moderate Assistance - Patient 50 - 74% ?Assistive device: Walker-rolling ?Max distance: 39f  ? ?Walk 10 feet activity ? ? ?Assist ?   ? ?Assist level: Moderate Assistance - Patient - 50 - 74% ?Assistive device: Hand held assist, Other (comment), Walker-rolling  ? ?Walk 50 feet activity ? ? ?Assist Walk 50 feet with 2 turns activity did not occur: Safety/medical concerns ? ?  ?   ? ? ?Walk 150 feet activity ? ? ?Assist Walk 150 feet activity did not occur: Safety/medical concerns ? ?  ?  ?  ? ?Walk 10 feet on uneven surface  ?activity ? ? ?Assist Walk 10 feet on uneven surfaces activity did not occur: Safety/medical concerns ? ? ?  ?    ? ?Wheelchair ? ? ? ? ?Assist Is the patient using a wheelchair?: Yes ?Type of Wheelchair: Manual ?  ? ?Wheelchair assist level: Dependent - Patient 0% ?   ? ? ?Wheelchair 50 feet with 2 turns activity ? ? ? ?Assist ? ?  ?  ? ? ?Assist Level: Dependent - Patient 0%  ? ?Wheelchair 150 feet activity  ? ? ? ?Assist ?   ? ? ?Assist Level: Dependent - Patient 0%  ? ?Blood pressure 118/65, pulse (!) 59, temperature (!) 95.2 ?F (35.1 ?C), resp. rate 16, height '5\' 6"'$  (1.676 m), weight 89.2 kg, SpO2 100 %. ?Medical Problem List and Plan: ?1. Functional deficits secondary to acute encephalopathy with baseline intellectual disability ? -DNR ?            -patient may shower ?            -ELOS/Goals: SNF now pending ?          -Continue CIR therapies including PT, OT, and SLP    ? -Appreciate Palliative Care assist! ?2.  Antithrombotics: ?-DVT/anticoagulation:  Pharmaceutical: Lovenox ?            -antiplatelet therapy: N/A ?3. Pain Management: Tylenol as needed ?4. Mood: Provide emotional support ?            -antipsychotic agents: N/A ?5. Neuropsych: This patient is not capable of making decisions on his own behalf. ?6. Skin/Wound Care: Routine skin  checks ?7. Fluids/Electrolytes/Nutrition:  ? -appetite seems reasonable so far ?8.  Recurrent aspiration pneumonia.  Antibiotic therapy completed 09/06/2021.  Currently on dysphagia #1 nectar thick liquid diet.  Follow-up speech therapy ? -has occ cough when eating but is easily able to clear.  ? - sitting up at 90 deg for meals ? -family has been made aware of aspiration precautions--palliative care f/u.  ? -recent cxr unremarkable. Lungs remain clear ?9.  Hypotension.  ProAmatine 5 mg 3 times daily.  Monitor with increased mobility.    ? -bp controlled 5/6 ? -5/12 bp controlled ?10.  Diabetes mellitus.  Latest hemoglobin A1c 6.4.  SSI.  Patient on Glucophage 1000 mg daily prior to admission.  Resume as needed.  ?-CBG (last 3)  ?Recent Labs  ?  09/15/21 ?1646 09/15/21 ?2104  09/16/21 ?0602  ?GLUCAP 114* 139* 75  ? ? -low cbg's in am. No meds currently. Regular diet ?-added hs snack which seems to have helped ?11.  Normocytic anemia.  Patient with hematology work-up in the past.  Follow-up CBC ? -had one + test in March, still waiting on stool sample ? -5/9 hgb up to 9.3, no s/s blood loss ?12.  Hyperlipidemia.  Patient on Lopid/Lipitor prior to admission.  Currently on hold due to  dysphagia ?13. Recurrent hypothermia with unclear cause ?            -asymptomatic, no associated sx ? -not sure that these temp readings are accurate ? -no further action indicated at this time ?14. AKI on CKD stage IIIa. improved continue to monitor ? -Cr stable/in range-5/9 ?15. GERD. Continue PPI ?16. Bradycardia. Seen by cardiology. Continue to monitor ? -Heart rate remains in 40-50'ss.  Patient asymptomatic ? -5/12==no change ? ?LOS: ?9 days ?A FACE TO FACE EVALUATION WAS PERFORMED ? ?Meredith Staggers ?09/16/2021, 9:15 AM  ? ?  ?

## 2021-09-16 NOTE — Progress Notes (Signed)
Physical Therapy Session Note ? ?Patient Details  ?Name: Daniel Bailey. ?MRN: [id number] ?Date of Birth: Sep 02, 1950 ? ?Today's Date: 09/16/2021 ?PT Individual Time: 3291-9166 ?PT Individual Time Calculation (min): 60 min  ? ?Short Term Goals: ?Week 1:  PT Short Term Goal 1 (Week 1): pt will perform bed mobility with CGA ?PT Short Term Goal 1 - Progress (Week 1): Met ?PT Short Term Goal 2 (Week 1): pt will transfer bed<>chair with LRAD and CGA ?PT Short Term Goal 2 - Progress (Week 1): Met ?PT Short Term Goal 3 (Week 1): pt will ambulate 74f with LRAD and min A ?PT Short Term Goal 3 - Progress (Week 1): Met ?Week 2:  PT Short Term Goal 1 (Week 2): STG=LTG due to patient pending SNF placement. ?Week 3:    ? ?Skilled Therapeutic Interventions/Progress Updates:  ? ?Pt received supine in bed and agreeable to PT. Supine>sit transfer with supervision assist with cues for use of bed features as needed. Donning Teds and pants sitting EOB with max assist for management. Supervision asist for sitting balance x 3 minutes. [facility]  ? ?Throughout session sit<>stand transfers performed with CGA-supervision assist with cues for improved anterior weight shift  x 10.  ? ?Gait training with RW x 1011fwith cues for improved posture and safety for AD management in turns and CGA for safety. Gait training them performed wth no AD x 10062fith CGA with intermittent RUE support on the rail in hall. Noted to have improved posture but decreased step length with no AD.  ? ?Dynamic balance training standing on red wedge to perform lateral reach and toss horse shoe to target with min assist to prevent posterior and anterior LOB x 9 bil. Standing on level surface to toss ball off trampoline and attmept catch. [Preformed 2 x 10 with 50% success in catch.  ? ?Pt returned to room and performed stand pivot transfer to bed with supervision assist and no AD. Sit>supine  completed with supervision assist for safety from PT and left supine in bed with call bell in reach and all needs met.  ? ?   ? ?Therapy Documentation ?Precautions:  ?Precautions ?Precautions: Fall ?Restrictions ?Weight Bearing Restrictions: No ?Pain: denies ? ? ? ?Therapy/Group: Individual Therapy ? ?AusLorie Phenix/04/2022, 9:10 AM  ?

## 2021-09-16 NOTE — Plan of Care (Signed)
?  Problem: Consults ?Goal: RH GENERAL PATIENT EDUCATION ?Description: See Patient Education module for education specifics. ?Outcome: Progressing ?Goal: Skin Care Protocol Initiated - if Braden Score 18 or less ?Description: If consults are not indicated, leave blank or document N/A ?Outcome: Progressing ?Goal: Diabetes Guidelines if Diabetic/Glucose > 140 ?Description: If diabetic or lab glucose is > 140 mg/dl - Initiate Diabetes/Hyperglycemia Guidelines & Document Interventions  ?Outcome: Progressing ?  ?Problem: RH BOWEL ELIMINATION ?Goal: RH STG MANAGE BOWEL WITH ASSISTANCE ?Description: STG Manage Bowel with Supervision Assistance. ?Outcome: Progressing ?Goal: RH STG MANAGE BOWEL W/MEDICATION W/ASSISTANCE ?Description: STG Manage Bowel with Medication with Supervision Assistance. ?Outcome: Progressing ?  ?Problem: RH BLADDER ELIMINATION ?Goal: RH STG MANAGE BLADDER WITH ASSISTANCE ?Description: STG Manage Bladder With Supervision Assistance ?Outcome: Progressing ?Goal: RH STG MANAGE BLADDER WITH MEDICATION WITH ASSISTANCE ?Description: STG Manage Bladder With Medication With Supervision Assistance. ?Outcome: Progressing ?  ?Problem: RH SKIN INTEGRITY ?Goal: RH STG MAINTAIN SKIN INTEGRITY WITH ASSISTANCE ?Description: STG Maintain Skin Integrity With Supervision Assistance. ?Outcome: Progressing ?Goal: RH STG ABLE TO PERFORM INCISION/WOUND CARE W/ASSISTANCE ?Description: STG Able To Perform Incision/Wound Care With Supervision Assistance. ?Outcome: Progressing ?  ?Problem: RH SAFETY ?Goal: RH STG ADHERE TO SAFETY PRECAUTIONS W/ASSISTANCE/DEVICE ?Description: STG Adhere to Safety Precautions With Cues and Reminders. ?Outcome: Progressing ?  ?Problem: RH KNOWLEDGE DEFICIT GENERAL ?Goal: RH STG INCREASE KNOWLEDGE OF SELF CARE AFTER HOSPITALIZATION ?Description: Patient and caregivers will demonstrate knowledge of self-care management, bowel/bladder management, skin/wound care with educational materials and handouts  provided by staff with supervision assistance at discharge. ?Outcome: Progressing ?  ?

## 2021-09-16 NOTE — Progress Notes (Addendum)
Patient ID: Daniel Bailey., male   DOB: 05-Dec-1950, 71 y.o.   MRN: [id number] ? ?SW left message for pt guardian Daniel Bailey reporting to discuss if she has selected any SNF locations. SW waiting on follow-up. ?*SW spoke with Daniel Bailey to discuss SNF-[facility] and remains ok with this location. Reports she is working on other supports that can come by the home to check in on pt throughout the day while she and her son are at work once he is discharged from SNF. SW shared once there is an answer from the state, will follow-up.  ? ?SW uploaded requested documents to [facility] and waiting on follow-up. ? ? ?Loralee Pacas, MSW, LCSWA ?Office: [phone] ?Cell: [phone] ?Fax: [phone]  ?

## 2021-09-17 DIAGNOSIS — E1169 Type 2 diabetes mellitus with other specified complication: Secondary | ICD-10-CM

## 2021-09-17 LAB — GLUCOSE, CAPILLARY
Glucose-Capillary: 72 mg/dL (ref 70–99)
Glucose-Capillary: 112 mg/dL — ABNORMAL HIGH (ref 70–99)
Glucose-Capillary: 108 mg/dL — ABNORMAL HIGH (ref 70–99)
Glucose-Capillary: 136 mg/dL — ABNORMAL HIGH (ref 70–99)

## 2021-09-17 NOTE — Progress Notes (Signed)
Occupational Therapy Session Note ? ?Patient Details  ?Name: Daniel Bailey. ?MRN: [id number] ?Date of Birth: 09-07-1950 ? ?Today's Date: 09/17/2021 ?OT Individual Time: 1000-1100 ?OT Individual Time Calculation (min): 60 min  ? ? ?Short Term Goals: ?Week 1:  OT Short Term Goal 1 (Week 1): Pt will ambulate with min A to obtain clothing items with RW ?OT Short Term Goal 1 - Progress (Week 1): Met ?OT Short Term Goal 2 (Week 1): Pt will be able to complete LB clothing with min A including socks and shoes ?OT Short Term Goal 2 - Progress (Week 1): Met ?OT Short Term Goal 3 (Week 1): Pt will demonstrate improved functional coordination via edema control to manage ADL fasteners with set up ?OT Short Term Goal 3 - Progress (Week 1): Met ?OT Short Term Goal 4 (Week 1): Pt will demonstrate safe standing balance for 2 grooming tasks sink side with contact guard A ?OT Short Term Goal 4 - Progress (Week 1): Met ? ?Skilled Therapeutic Interventions/Progress Updates:  ?   ?Pt received in bed with no pain but still with teds on and OT discusses with NT to relay to night shift to take teds off at night. Once finished with mobilizing, OT doffs teds to give skin a break.  ?ADL: ?Pt completes ADL at overall supervision Level. Skilled interventions include: keeping feet inside RW during turning and stepping into bathroom around bed, crossing into figure 4 in LLE + pushing pants down to ankle with RLE threading, VC forhand placement for transitional movements for the walker and pushing sleeves past elbows when donning shirt.  ? ?Pt self feeds yogurt and nectar thick juice with mincuing for SLP strategies and 1 instance of coughing at beginning but after no overt s/s of aspiration.  ? ?Pt left at end of session in recliner with visitor present with exit alarm on, call light in reach and all needs met ? ? ?Therapy Documentation ?Precautions:  ?Precautions ?Precautions: Fall ?Restrictions ?Weight Bearing Restrictions: No ?General: ?   ? ?Therapy/Group: Individual Therapy ? ?[name] ?09/17/2021, 6:56 AM ?

## 2021-09-17 NOTE — Progress Notes (Shared)
Occupational Therapy Discharge Summary ? ?Patient Details  ?Name: Daemyn Gariepy. ?MRN: [id number] ?Date of Birth: 1951-03-05 ? ?Patient has met {NUMBERS 0-12:18577} of {NUMBERS 0-12:18577} long term goals due to improved activity tolerance, improved balance, postural control, ability to compensate for deficits, and improved coordination.  Patient to discharge at overall Supervision-MIN A level.  Patient is DC ing to SNF d/t needing further rehab and 24/7 supervision to work towards MOD I goals as all family works. Pt has made good progress from MAX-MOD A level but can intermittently requires MIN A for thorough posterior hygeine   ? ?Reasons goals not met: n/a ? ?Recommendation:  ?Patient will benefit from ongoing skilled OT services in skilled nursing facility setting to continue to advance functional skills in the area of BADL and Reduce care partner burden. ? ?Equipment: ?No equipment provided ? ?Reasons for discharge: treatment goals met and discharge from hospital ? ?Patient/family agrees with progress made and goals achieved: Yes ? ?OT Discharge ?Precautions/Restrictions  ?Precautions ?Precautions: Fall ?Restrictions ?Weight Bearing Restrictions: No ?General ?  ?Vital Signs ?Therapy Vitals ?Pulse Rate: (!) 54 ?Resp: 17 ?BP: 113/61 ?Patient Position (if appropriate): Sitting ?Oxygen Therapy ?SpO2: 100 % ?O2 Device: Room Air ?Pain ?  ?ADL ?ADL ?Grooming: Supervision/safety ?Where Assessed-Grooming: Wheelchair ?Upper Body Bathing: Supervision/safety ?Where Assessed-Upper Body Bathing: Shower ?Lower Body Bathing: Supervision/safety ?Where Assessed-Lower Body Bathing: Shower ?Upper Body Dressing: Supervision/safety ?Where Assessed-Upper Body Dressing: Chair ?Lower Body Dressing: Supervision/safety ?Where Assessed-Lower Body Dressing: Chair ?Toileting: Supervision/safety, Minimal assistance (sometimes min A for thoroughness of posterior hygiene) ?Where Assessed-Toileting: Bedside Commode ?Toilet Transfer: Close  supervision ?Toilet Transfer Method: Ambulating ?Toilet Transfer Equipment: Bedside commode ?Walk-In Shower Transfer: Close supervision ?Walk-In Shower Transfer Method: Ambulating ?Walk-In Shower Equipment: Radio broadcast assistant, Grab bars ?ADL Comments: significant effort and assist to reach to LE's for LB bathing and dressing, was able to cross L LE up for garment management but unable to cross R LE up close enough, set up and cues for all steps of self care due to decreased initiation and cog/processing, used suction toothbrush due to dysphasia ?Vision ?Baseline Vision/History: 1 Wears glasses ?Patient Visual Report: No change from baseline ?Vision Assessment?: No apparent visual deficits ?Perception  ?Perception: Within Functional Limits ?Praxis ?Praxis: Intact ?Cognition ?Cognition ?Overall Cognitive Status: History of cognitive impairments - at baseline ?Arousal/Alertness: Awake/alert ?Orientation Level: Person;Place ?Memory: Impaired ?Memory Impairment: Decreased recall of new information;Decreased short term memory ?Sustained Attention: Appears intact ?Divided Attention: Impaired ?Awareness: Impaired ?Decision Making: Impaired ?Initiating: Impaired ?Safety/Judgment: Appears intact ?Sensation ?Sensation ?Light Touch: Appears Intact ?Coordination ?Gross Motor Movements are Fluid and Coordinated: Yes ?Fine Motor Movements are Fluid and Coordinated: No ?Finger Nose Finger Test: dysmetria bilaterally and required cues to follow instructions; increased time to execite motor commands ?Motor  ?Motor ?Motor: Abnormal postural alignment and control ?Motor - Skilled Clinical Observations: improved coordination, however remains slow and deliberate ?Mobility  ?Bed Mobility ?Rolling Right: Supervision/verbal cueing ?Rolling Left: Supervision/Verbal cueing ?Supine to Sit: Supervision/Verbal cueing ?Transfers ?Sit to Stand: Supervision/Verbal cueing ?Stand to Sit: Supervision/Verbal cueing  ?Trunk/Postural Assessment   ?Cervical Assessment ?Cervical Assessment: Within Functional Limits ?Thoracic Assessment ?Thoracic Assessment: Exceptions to [facility] (shoulders rounded) ?Lumbar Assessment ?Lumbar Assessment: Exceptions to [facility] (post tilt) ?Postural Control ?Postural Control: Deficits on evaluation ?Righting Reactions: delayed, but improved since eval  ?Balance ?  MOD I sitting balance ?Supervision static and dynamic standing balance ?Extremity/Trunk Assessment ?  BUE WFL strength and ROM ?  ? ? ?Lowella Dell Deshara Rossi ?09/17/2021, 2:37 PM ?

## 2021-09-17 NOTE — Plan of Care (Signed)
?  Problem: Consults ?Goal: RH GENERAL PATIENT EDUCATION ?Description: See Patient Education module for education specifics. ?Outcome: Progressing ?Goal: Skin Care Protocol Initiated - if Braden Score 18 or less ?Description: If consults are not indicated, leave blank or document N/A ?Outcome: Progressing ?Goal: Diabetes Guidelines if Diabetic/Glucose > 140 ?Description: If diabetic or lab glucose is > 140 mg/dl - Initiate Diabetes/Hyperglycemia Guidelines & Document Interventions  ?Outcome: Progressing ?  ?Problem: RH BOWEL ELIMINATION ?Goal: RH STG MANAGE BOWEL WITH ASSISTANCE ?Description: STG Manage Bowel with Supervision Assistance. ?Outcome: Progressing ?Goal: RH STG MANAGE BOWEL W/MEDICATION W/ASSISTANCE ?Description: STG Manage Bowel with Medication with Supervision Assistance. ?Outcome: Progressing ?  ?Problem: RH BLADDER ELIMINATION ?Goal: RH STG MANAGE BLADDER WITH ASSISTANCE ?Description: STG Manage Bladder With Supervision Assistance ?Outcome: Progressing ?Goal: RH STG MANAGE BLADDER WITH MEDICATION WITH ASSISTANCE ?Description: STG Manage Bladder With Medication With Supervision Assistance. ?Outcome: Progressing ?  ?Problem: RH SKIN INTEGRITY ?Goal: RH STG MAINTAIN SKIN INTEGRITY WITH ASSISTANCE ?Description: STG Maintain Skin Integrity With Supervision Assistance. ?Outcome: Progressing ?Goal: RH STG ABLE TO PERFORM INCISION/WOUND CARE W/ASSISTANCE ?Description: STG Able To Perform Incision/Wound Care With Supervision Assistance. ?Outcome: Progressing ?  ?Problem: RH SAFETY ?Goal: RH STG ADHERE TO SAFETY PRECAUTIONS W/ASSISTANCE/DEVICE ?Description: STG Adhere to Safety Precautions With Cues and Reminders. ?Outcome: Progressing ?  ?Problem: RH KNOWLEDGE DEFICIT GENERAL ?Goal: RH STG INCREASE KNOWLEDGE OF SELF CARE AFTER HOSPITALIZATION ?Description: Patient and caregivers will demonstrate knowledge of self-care management, bowel/bladder management, skin/wound care with educational materials and handouts  provided by staff with supervision assistance at discharge. ?Outcome: Progressing ?  ?

## 2021-09-17 NOTE — Progress Notes (Signed)
?                                                       PROGRESS NOTE ? ? ?Subjective/Complaints: ?No new concerns or complaints this AM.  ? ? ? ? ?Review of Systems  ?Constitutional: Negative.  Negative for chills, fever and malaise/fatigue.  ?HENT: Negative.    ?Respiratory:  Negative for cough and shortness of breath.   ?Cardiovascular: Negative.  Negative for chest pain and palpitations.  ?Gastrointestinal: Negative.  Negative for nausea.  ?Genitourinary: Negative.   ?Skin: Negative.   ?Neurological: Negative.  Negative for dizziness.  ?Psychiatric/Behavioral: Negative.  Negative for depression.    ? ? ?Objective: ?  ?No results found. ?No results for input(s): WBC, HGB, HCT, PLT in the last 72 hours. ? ?No results for input(s): NA, K, CL, CO2, GLUCOSE, BUN, CREATININE, CALCIUM in the last 72 hours. ? ? ?Intake/Output Summary (Last 24 hours) at 09/17/2021 0853 ?Last data filed at 09/17/2021 [phone] ?Gross per 24 hour  ?Intake 717 ml  ?Output --  ?Net 717 ml  ? ?  ? ?  ? ?Physical Exam: ?Vital Signs ?Blood pressure 115/72, pulse (!) 54, temperature (!) 95.2 ?F (35.1 ?C), resp. rate 18, height '5\' 6"'$  (1.676 m), weight 86.7 kg, SpO2 97 %. ? ?Constitutional: No distress . Vital signs reviewed. In bed today ?HEENT: NCAT, EOMI, oral membranes moist ?Neck: supple ?Cardiovascular: RRR without murmur ?Respiratory/Chest: CTA Bilaterally without wheezes or rales. Non-labored ?GI/Abdomen: BS +, non-tender, non-distended ?Ext: no clubbing, cyanosis, or edema, warm dry ?Psych: pleasant and cooperative ?Neuro:   Patient is alert and oriented to self. Patient was attentive and can follow simple commands. More awareness. Moves all 4 extremities without limitations. ?Musculoskeletal: Full ROM, No pain with AROM or PROM in the neck, trunk, or extremities. Posture appropriate   ? ? ?Assessment/Plan: ?1. Functional deficits which require 3+ hours per day of interdisciplinary therapy in a comprehensive inpatient rehab  setting. ?Physiatrist is providing close team supervision and 24 hour management of active medical problems listed below. ?Physiatrist and rehab team continue to assess barriers to discharge/monitor patient progress toward functional and medical goals ? ?Care Tool: ? ?Bathing ?   ?Body parts bathed by patient: Right arm, Left arm, Chest, Abdomen, Front perineal area, Buttocks, Right upper leg, Left upper leg, Right lower leg, Left lower leg, Face  ? Body parts bathed by helper: Right arm ?  ?  ?Bathing assist Assist Level: Contact Guard/Touching assist ?  ?  ?Upper Body Dressing/Undressing ?Upper body dressing   ?What is the patient wearing?: Pull over shirt ?   ?Upper body assist Assist Level: Minimal Assistance - Patient > 75% ?   ?Lower Body Dressing/Undressing ?Lower body dressing ? ? ?   ?What is the patient wearing?: Pants, Incontinence brief ? ?  ? ?Lower body assist Assist for lower body dressing: Moderate Assistance - Patient 50 - 74% ?   ? ?Toileting ?Toileting    ?Toileting assist Assist for toileting: Minimal Assistance - Patient > 75% ?  ?  ?Transfers ?Chair/bed transfer ? ?Transfers assist ?   ? ?Chair/bed transfer assist level: Minimal Assistance - Patient > 75% ?  ?  ?Locomotion ?Ambulation ? ? ?Ambulation assist ? ?   ? ?Assist level: Moderate Assistance - Patient 50 - 74% ?Assistive device:  Walker-rolling ?Max distance: 66f  ? ?Walk 10 feet activity ? ? ?Assist ?   ? ?Assist level: Moderate Assistance - Patient - 50 - 74% ?Assistive device: Hand held assist, Other (comment), Walker-rolling  ? ?Walk 50 feet activity ? ? ?Assist Walk 50 feet with 2 turns activity did not occur: Safety/medical concerns ? ?  ?   ? ? ?Walk 150 feet activity ? ? ?Assist Walk 150 feet activity did not occur: Safety/medical concerns ? ?  ?  ?  ? ?Walk 10 feet on uneven surface  ?activity ? ? ?Assist Walk 10 feet on uneven surfaces activity did not occur: Safety/medical concerns ? ? ?  ?   ? ?Wheelchair ? ? ? ? ?Assist Is  the patient using a wheelchair?: Yes ?Type of Wheelchair: Manual ?  ? ?Wheelchair assist level: Dependent - Patient 0% ?   ? ? ?Wheelchair 50 feet with 2 turns activity ? ? ? ?Assist ? ?  ?  ? ? ?Assist Level: Dependent - Patient 0%  ? ?Wheelchair 150 feet activity  ? ? ? ?Assist ?   ? ? ?Assist Level: Dependent - Patient 0%  ? ?Blood pressure 115/72, pulse (!) 54, temperature (!) 95.2 ?F (35.1 ?C), resp. rate 18, height '5\' 6"'$  (1.676 m), weight 86.7 kg, SpO2 97 %. ?Medical Problem List and Plan: ?1. Functional deficits secondary to acute encephalopathy with baseline intellectual disability ? -DNR ?            -patient may shower ?            -ELOS/Goals: SNF now pending ?          -Continue CIR therapies including PT, OT, and SLP    ? -Appreciate Palliative Care assist! ? -Walked 120 ft CGA ?2.  Antithrombotics: ?-DVT/anticoagulation:  Pharmaceutical: Lovenox ?            -antiplatelet therapy: N/A ?3. Pain Management: Tylenol as needed ?4. Mood: Provide emotional support ?            -antipsychotic agents: N/A ?5. Neuropsych: This patient is not capable of making decisions on his own behalf. ?6. Skin/Wound Care: Routine skin  checks ?7. Fluids/Electrolytes/Nutrition:  ? -appetite seems reasonable so far ?8.  Recurrent aspiration pneumonia.  Antibiotic therapy completed 09/06/2021.  Currently on dysphagia #1 nectar thick liquid diet.  Follow-up speech therapy ? -has occ cough when eating but is easily able to clear.  ? - sitting up at 90 deg for meals ? -family has been made aware of aspiration precautions--palliative care f/u.  ? -recent cxr unremarkable. Lungs remain clear ?9.  Hypotension.  ProAmatine 5 mg 3 times daily.  Monitor with increased mobility.    ? -bp controlled 5/6 ? -5/12 bp controlled ?10.  Diabetes mellitus.  Latest hemoglobin A1c 6.4.  SSI.  Patient on Glucophage 1000 mg daily prior to admission.  Resume as needed.  ?-CBG (last 3)  ?Recent Labs  ?  09/16/21 ?1629 09/16/21 ?2104 09/17/21 ?0633   ?GLUCAP 119* 133* 72  ? ? -5/13 low CBGs in AM, not on any diabetic meds, asymptomatic, continue to monitor ?11.  Normocytic anemia.  Patient with hematology work-up in the past.  Follow-up CBC ? -had one + test in March, still waiting on stool sample ? -5/9 hgb up to 9.3, no s/s blood loss ?12.  Hyperlipidemia.  Patient on Lopid/Lipitor prior to admission.  Currently on hold due to dysphagia ?13. Recurrent hypothermia with unclear cause ?            -  asymptomatic, no associated sx ? -not sure that these temp readings are accurate ? -no further action indicated at this time ?14. AKI on CKD stage IIIa. improved continue to monitor ? -Cr stable/in range-5/9 ? -Advised fluid intake ?15. GERD. Continue PPI ?16. Bradycardia. Seen by cardiology. Continue to monitor ? -Heart rate remains in 40-50'ss.  Patient asymptomatic ? -5/13 HR in 50s, continues to be asymptomatic, continue to monitor ? ?LOS: ?10 days ?A FACE TO FACE EVALUATION WAS PERFORMED ? ?Jennye Boroughs ?09/17/2021, 8:53 AM  ? ?  ?

## 2021-09-18 LAB — GLUCOSE, CAPILLARY
Glucose-Capillary: 86 mg/dL (ref 70–99)
Glucose-Capillary: 122 mg/dL — ABNORMAL HIGH (ref 70–99)
Glucose-Capillary: 103 mg/dL — ABNORMAL HIGH (ref 70–99)
Glucose-Capillary: 128 mg/dL — ABNORMAL HIGH (ref 70–99)

## 2021-09-18 NOTE — Progress Notes (Signed)
?                                                       PROGRESS NOTE ? ? ?Subjective/Complaints: ?Eating breakfast in bed. No concerns.  ? ? ? ? ?Review of Systems  ?Constitutional: Negative.  Negative for chills, fever and malaise/fatigue.  ?HENT: Negative.    ?Respiratory:  Negative for cough and shortness of breath.   ?Cardiovascular: Negative.  Negative for chest pain.  ?Gastrointestinal: Negative.  Negative for nausea.  ?Genitourinary: Negative.   ?Musculoskeletal:  Negative for myalgias.  ?Skin: Negative.   ?Neurological: Negative.  Negative for dizziness.  ?Psychiatric/Behavioral: Negative.  Negative for depression.    ? ? ?Objective: ?  ?No results found. ?No results for input(s): WBC, HGB, HCT, PLT in the last 72 hours. ? ?No results for input(s): NA, K, CL, CO2, GLUCOSE, BUN, CREATININE, CALCIUM in the last 72 hours. ? ? ?Intake/Output Summary (Last 24 hours) at 09/18/2021 0739 ?Last data filed at 09/17/2021 2215 ?Gross per 24 hour  ?Intake 1080 ml  ?Output 1000 ml  ?Net 80 ml  ? ?  ? ?  ? ?Physical Exam: ?Vital Signs ?Blood pressure 112/69, pulse (!) 59, temperature (!) 95.2 ?F (35.1 ?C), resp. rate 18, height '5\' 6"'$  (1.676 m), weight 86.1 kg, SpO2 100 %. ? ?Constitutional: No distress . Vital signs reviewed. In bed today eating ?HEENT: NCAT, EOMI, oral membranes moist ?Neck: supple ?Cardiovascular: RRR without murmur ?Respiratory/Chest: CTA Bilaterally without wheezes or rales. Non-labored ?GI/Abdomen: BS +, non-tender, non-distended ?Ext: no clubbing, cyanosis, or edema ?Psych: pleasant and cooperative ?Neuro:   Patient is alert and oriented to self. Patient was attentive and can follow simple commands. More awareness. Moves all 4 extremities without limitations. ?Skin is warm and dry ?Musculoskeletal: Full ROM, No pain with AROM or PROM in the neck, trunk, or extremities. Posture appropriate   ? ? ?Assessment/Plan: ?1. Functional deficits which require 3+ hours per day of interdisciplinary therapy in a  comprehensive inpatient rehab setting. ?Physiatrist is providing close team supervision and 24 hour management of active medical problems listed below. ?Physiatrist and rehab team continue to assess barriers to discharge/monitor patient progress toward functional and medical goals ? ?Care Tool: ? ?Bathing ?   ?Body parts bathed by patient: Right arm, Left arm, Chest, Abdomen, Front perineal area, Buttocks, Right upper leg, Left upper leg, Right lower leg, Left lower leg, Face  ? Body parts bathed by helper: Right arm ?  ?  ?Bathing assist Assist Level: Contact Guard/Touching assist ?  ?  ?Upper Body Dressing/Undressing ?Upper body dressing   ?What is the patient wearing?: Pull over shirt ?   ?Upper body assist Assist Level: Minimal Assistance - Patient > 75% ?   ?Lower Body Dressing/Undressing ?Lower body dressing ? ? ?   ?What is the patient wearing?: Pants, Incontinence brief ? ?  ? ?Lower body assist Assist for lower body dressing: Moderate Assistance - Patient 50 - 74% ?   ? ?Toileting ?Toileting    ?Toileting assist Assist for toileting: Minimal Assistance - Patient > 75% ?  ?  ?Transfers ?Chair/bed transfer ? ?Transfers assist ?   ? ?Chair/bed transfer assist level: Minimal Assistance - Patient > 75% ?  ?  ?Locomotion ?Ambulation ? ? ?Ambulation assist ? ?   ? ?Assist level: Moderate  Assistance - Patient 50 - 74% ?Assistive device: Walker-rolling ?Max distance: 53f  ? ?Walk 10 feet activity ? ? ?Assist ?   ? ?Assist level: Moderate Assistance - Patient - 50 - 74% ?Assistive device: Hand held assist, Other (comment), Walker-rolling  ? ?Walk 50 feet activity ? ? ?Assist Walk 50 feet with 2 turns activity did not occur: Safety/medical concerns ? ?  ?   ? ? ?Walk 150 feet activity ? ? ?Assist Walk 150 feet activity did not occur: Safety/medical concerns ? ?  ?  ?  ? ?Walk 10 feet on uneven surface  ?activity ? ? ?Assist Walk 10 feet on uneven surfaces activity did not occur: Safety/medical concerns ? ? ?  ?    ? ?Wheelchair ? ? ? ? ?Assist Is the patient using a wheelchair?: Yes ?Type of Wheelchair: Manual ?  ? ?Wheelchair assist level: Dependent - Patient 0% ?   ? ? ?Wheelchair 50 feet with 2 turns activity ? ? ? ?Assist ? ?  ?  ? ? ?Assist Level: Dependent - Patient 0%  ? ?Wheelchair 150 feet activity  ? ? ? ?Assist ?   ? ? ?Assist Level: Dependent - Patient 0%  ? ?Blood pressure 112/69, pulse (!) 59, temperature (!) 95.2 ?F (35.1 ?C), resp. rate 18, height '5\' 6"'$  (1.676 m), weight 86.1 kg, SpO2 100 %. ?Medical Problem List and Plan: ?1. Functional deficits secondary to acute encephalopathy with baseline intellectual disability ? -DNR ?            -patient may shower ?            -ELOS/Goals: SNF now pending ?          -Continue CIR therapies including PT, OT, and SLP    ? -Appreciate Palliative Care assist! ? -Walked 120 ft CGA ?2.  Antithrombotics: ?-DVT/anticoagulation:  Pharmaceutical: Lovenox ?            -antiplatelet therapy: N/A ?3. Pain Management: Tylenol as needed ?4. Mood: Provide emotional support ?            -antipsychotic agents: N/A ?5. Neuropsych: This patient is not capable of making decisions on his own behalf. ?6. Skin/Wound Care: Routine skin  checks ?7. Fluids/Electrolytes/Nutrition:  ? -appetite seems reasonable so far ?8.  Recurrent aspiration pneumonia.  Antibiotic therapy completed 09/06/2021.  Currently on dysphagia #1 nectar thick liquid diet.  Follow-up speech therapy ? -has occ cough when eating but is easily able to clear.  ? - sitting up at 90 deg for meals ? -family has been made aware of aspiration precautions--palliative care f/u.  ? -recent cxr unremarkable. Lungs remain clear ?9.  Hypotension.  ProAmatine 5 mg 3 times daily.  Monitor with increased mobility.    ? -bp controlled 5/6 ? -5/12 bp controlled ?10.  Diabetes mellitus.  Latest hemoglobin A1c 6.4.  SSI.  Patient on Glucophage 1000 mg daily prior to admission.  Resume as needed.  ?-CBG (last 3)  ?Recent Labs  ?  09/17/21 ?1656  09/17/21 ?2107 09/18/21 ?0618  ?GLUCAP 108* 136* 86  ? ? -5/13 low CBGs in AM, not on any diabetic meds, asymptomatic, continue to monitor ?11.  Normocytic anemia.  Patient with hematology work-up in the past.  Follow-up CBC ? -had one + test in March, still waiting on stool sample ? -5/9 hgb up to 9.3, no s/s blood loss ? -Repeat labs tomorrow ordered ?12.  Hyperlipidemia.  Patient on Lopid/Lipitor prior to admission.  Currently  on hold due to dysphagia ?13. Recurrent hypothermia with unclear cause ?            -asymptomatic, no associated sx ? -not sure that these temp readings are accurate ? -no further action indicated at this time ?14. AKI on CKD stage IIIa. improved continue to monitor ? -Cr stable/in range-5/9 ? -Advised fluid intake ? -Repeat labs ordered for tomorrow ?15. GERD. Continue PPI ?16. Bradycardia. Seen by cardiology. Continue to monitor ? -Heart rate remains in 40-50'ss.  Patient asymptomatic ? -5/14 HR 56 on my exam, not on BB, overall stable, follow ? ?LOS: ?11 days ?A FACE TO FACE EVALUATION WAS PERFORMED ? ?Jennye Boroughs ?09/18/2021, 7:39 AM  ? ?  ?

## 2021-09-18 NOTE — Plan of Care (Signed)
?  Problem: Consults ?Goal: RH GENERAL PATIENT EDUCATION ?Description: See Patient Education module for education specifics. ?Outcome: Progressing ?Goal: Skin Care Protocol Initiated - if Braden Score 18 or less ?Description: If consults are not indicated, leave blank or document N/A ?Outcome: Progressing ?Goal: Diabetes Guidelines if Diabetic/Glucose > 140 ?Description: If diabetic or lab glucose is > 140 mg/dl - Initiate Diabetes/Hyperglycemia Guidelines & Document Interventions  ?Outcome: Progressing ?  ?Problem: RH BOWEL ELIMINATION ?Goal: RH STG MANAGE BOWEL WITH ASSISTANCE ?Description: STG Manage Bowel with Supervision Assistance. ?Outcome: Progressing ?Goal: RH STG MANAGE BOWEL W/MEDICATION W/ASSISTANCE ?Description: STG Manage Bowel with Medication with Supervision Assistance. ?Outcome: Progressing ?  ?Problem: RH BLADDER ELIMINATION ?Goal: RH STG MANAGE BLADDER WITH ASSISTANCE ?Description: STG Manage Bladder With Supervision Assistance ?Outcome: Progressing ?Goal: RH STG MANAGE BLADDER WITH MEDICATION WITH ASSISTANCE ?Description: STG Manage Bladder With Medication With Supervision Assistance. ?Outcome: Progressing ?  ?Problem: RH SKIN INTEGRITY ?Goal: RH STG MAINTAIN SKIN INTEGRITY WITH ASSISTANCE ?Description: STG Maintain Skin Integrity With Supervision Assistance. ?Outcome: Progressing ?Goal: RH STG ABLE TO PERFORM INCISION/WOUND CARE W/ASSISTANCE ?Description: STG Able To Perform Incision/Wound Care With Supervision Assistance. ?Outcome: Progressing ?  ?Problem: RH SAFETY ?Goal: RH STG ADHERE TO SAFETY PRECAUTIONS W/ASSISTANCE/DEVICE ?Description: STG Adhere to Safety Precautions With Cues and Reminders. ?Outcome: Progressing ?  ?Problem: RH KNOWLEDGE DEFICIT GENERAL ?Goal: RH STG INCREASE KNOWLEDGE OF SELF CARE AFTER HOSPITALIZATION ?Description: Patient and caregivers will demonstrate knowledge of self-care management, bowel/bladder management, skin/wound care with educational materials and handouts  provided by staff with supervision assistance at discharge. ?Outcome: Progressing ?  ?

## 2021-09-18 NOTE — Progress Notes (Signed)
Physical Therapy Session Note ? ?Patient Details  ?Name: Daniel Bailey. ?MRN: [id number] ?Date of Birth: Mar 20, 1951 ? ?Today's Date: 09/18/2021 ?PT Individual Time: 0900-1000 ?PT Individual Time Calculation (min): 60 min  ? ?Short Term Goals: ?Week 2:  PT Short Term Goal 1 (Week 2): STG=LTG due to patient pending SNF placement. ? ?Skilled Therapeutic Interventions/Progress Updates:  ?   ?Patient in bed asleep upon PT arrival. Patient easily aroused and agreeable to PT session. Patient denied pain during session. Patient could not recall if he ate breakfast this morning, per LPN patient did eat breakfast.  ? ?Provided timed toileting at beginning of session with successful void, see flowsheet for details. Focused remainder of session on gait training and balance and strengthening with functional transfer training. Patient with poor management of secretions x1 with coughing during session. Provided patient with nectar thick cranberry juice for small sips between activities followed by a dry swallow with mod cues without further issue with secretion management.   ? ?Therapeutic Activity: ?Bed Mobility: Patient performed supine to sit with independently in a flat bed without use of bed rails.  ?Transfers: Patient performed an ambulatory transfer to the bathroom and sink with CGA without an AD. Required cues to recall lower body clothing management, performed with increased time and cues for brief management. Patient was continent of bladder during toileting, performed peri-care with set-up assist, and hand hygiene with min cues for locating soap and paper towels and CGA for standing balance. He performed sit to/from stand x4 with supervision without AD. Provided verbal cues for forward weight shift and controlled descent for safety. ? ?Five times Sit to Stand Test (FTSS) ?Method: ?Use a straight back chair with a solid seat that is 17-18? high. Ask participant to sit on the chair with arms folded across their chest.    ?Instructions: ??Stand up and sit down as quickly as possible 5 times, keeping your arms folded across your chest.?   ?Measurement: ?Stop timing when the participant touches the chair in sitting the 5th time. ?TIME: trial 1: 19 sec; trial 2: 15 sec; trial 3: 18 sec; average: 17.3 sec ?Cut off scores indicative of increased fall risk: >12 sec CVA, >16 sec PD, >13 sec vestibular ?(ANPTA Core Set of Outcome Measures for Adults with Neurologic Conditions, 2018) ? ?Gait Training:  ?Patient ambulated >150 feet x2 without an AD with CGA progressing to supervision >20 feet. Ambulated with decreased gait speed, decreased step length and height R>L, increased BOS, increased B toe out, forward trunk lean, and downward head gaze. Provided verbal cues for erect posture, looking ahead, increased gait speed and arm swing for improved balance, and increased R foot clearance with fatigue. ?6 Min Walk Test:  ?Instructed patient to ambulate as quickly and as safely as possible for 6 minutes using LRAD. Patient was allowed to take standing rest breaks without stopping the test, but if the patient required a sitting rest break the clock would be stopped and the test would be over.  ?Results: 300 feet in 3:05 limited by fatigue (91.4 meters, Avg speed 0.4 m/s) without an AD with CGA. Results indicate that the patient has reduced endurance with ambulation compared to age matched norms.  ?Age Matched Norms: 27-79 yo M: 527 meters ?MDC: 58.21 meters (190.98 feet) or 50 meters ?(ANPTA Core Set of Outcome Measures for Adults with Neurologic Conditions, 2018) ? ?Patient required increased time for mobility and rest breaks throughout session due to decreased activity tolerance. ? ?Patient in recliner in  the room at end of session with breaks locked, seat belt alarm set, and all needs within reach.  ? ?Therapy Documentation ?Precautions:  ?Precautions ?Precautions: Fall ?Restrictions ?Weight Bearing Restrictions: No ? ? ? ?Therapy/Group:  Individual Therapy ? ?Doreene Burke PT, DPT ? ?09/18/2021, 6:00 PM  ?

## 2021-09-19 ENCOUNTER — Other Ambulatory Visit: Payer: Self-pay | Admitting: Family Medicine

## 2021-09-19 LAB — GLUCOSE, CAPILLARY
Glucose-Capillary: 80 mg/dL (ref 70–99)
Glucose-Capillary: 111 mg/dL — ABNORMAL HIGH (ref 70–99)
Glucose-Capillary: 104 mg/dL — ABNORMAL HIGH (ref 70–99)
Glucose-Capillary: 181 mg/dL — ABNORMAL HIGH (ref 70–99)

## 2021-09-19 NOTE — Progress Notes (Signed)
Patient ID: Daniel Bailey., male   DOB: 04-Jul-1950, 71 y.o.   MRN: [id number] ? ?Pt is waiting to be assessed by state for Level II PASRR. Pending eval, pt will be able to d/c to SNF/. ? ?Loralee Pacas, MSW, LCSWA ?Office: [phone] ?Cell: [phone] ?Fax: [phone]  ?

## 2021-09-19 NOTE — Progress Notes (Signed)
?                                                       PROGRESS NOTE ? ? ?Subjective/Complaints: ?Pt up in chair. No new issues. Feels well.  ? ? ? ?Review of Systems  ?Constitutional: Negative.  Negative for chills, fever and malaise/fatigue.  ?HENT: Negative.    ?Respiratory:  Positive for cough. Negative for shortness of breath.   ?Cardiovascular: Negative.  Negative for chest pain.  ?Gastrointestinal: Negative.  Negative for nausea.  ?Genitourinary: Negative.   ?Musculoskeletal:  Negative for myalgias.  ?Skin: Negative.   ?Neurological: Negative.  Negative for dizziness.  ?Psychiatric/Behavioral: Negative.  Negative for depression.    ? ? ?Objective: ?  ?No results found. ?No results for input(s): WBC, HGB, HCT, PLT in the last 72 hours. ? ?No results for input(s): NA, K, CL, CO2, GLUCOSE, BUN, CREATININE, CALCIUM in the last 72 hours. ? ? ?Intake/Output Summary (Last 24 hours) at 09/19/2021 2426 ?Last data filed at 09/19/2021 0511 ?Gross per 24 hour  ?Intake 294 ml  ?Output 1150 ml  ?Net -856 ml  ? ?  ? ?  ? ?Physical Exam: ?Vital Signs ?Blood pressure 129/69, pulse (!) 54, temperature 97.8 ?F (36.6 ?C), resp. rate 17, height '5\' 6"'$  (1.676 m), weight 84.6 kg, SpO2 99 %. ? ?Constitutional: No distress . Vital signs reviewed. ?HEENT: NCAT, EOMI, oral membranes moist ?Neck: supple ?Cardiovascular: RRR without murmur. No JVD    ?Respiratory/Chest: CTA Bilaterally without wheezes or rales. Normal effort. Did cough once ?GI/Abdomen: BS +, non-tender, non-distended ?Ext: no clubbing, cyanosis, or edema ?Psych: pleasant and cooperative  ?Neuro:   Patient is alert and oriented to self. Patient was attentive and can follow simple commands. More awareness. Moves all 4 extremities without limitations. ?Speech much clearer ?Skin is warm and dry ?Musculoskeletal: Full ROM, No pain with AROM or PROM in the neck, trunk, or extremities. Posture appropriate   ? ? ?Assessment/Plan: ?1. Functional deficits which require 3+ hours  per day of interdisciplinary therapy in a comprehensive inpatient rehab setting. ?Physiatrist is providing close team supervision and 24 hour management of active medical problems listed below. ?Physiatrist and rehab team continue to assess barriers to discharge/monitor patient progress toward functional and medical goals ? ?Care Tool: ? ?Bathing ?   ?Body parts bathed by patient: Right arm, Left arm, Chest, Abdomen, Front perineal area, Buttocks, Right upper leg, Left upper leg, Right lower leg, Left lower leg, Face  ? Body parts bathed by helper: Right arm ?  ?  ?Bathing assist Assist Level: Contact Guard/Touching assist ?  ?  ?Upper Body Dressing/Undressing ?Upper body dressing   ?What is the patient wearing?: Pull over shirt ?   ?Upper body assist Assist Level: Minimal Assistance - Patient > 75% ?   ?Lower Body Dressing/Undressing ?Lower body dressing ? ? ?   ?What is the patient wearing?: Pants, Incontinence brief ? ?  ? ?Lower body assist Assist for lower body dressing: Moderate Assistance - Patient 50 - 74% ?   ? ?Toileting ?Toileting    ?Toileting assist Assist for toileting: Minimal Assistance - Patient > 75% ?  ?  ?Transfers ?Chair/bed transfer ? ?Transfers assist ?   ? ?Chair/bed transfer assist level: Minimal Assistance - Patient > 75% ?  ?  ?Locomotion ?Ambulation ? ? ?  Ambulation assist ? ?   ? ?Assist level: Moderate Assistance - Patient 50 - 74% ?Assistive device: Walker-rolling ?Max distance: 109f  ? ?Walk 10 feet activity ? ? ?Assist ?   ? ?Assist level: Moderate Assistance - Patient - 50 - 74% ?Assistive device: Hand held assist, Other (comment), Walker-rolling  ? ?Walk 50 feet activity ? ? ?Assist Walk 50 feet with 2 turns activity did not occur: Safety/medical concerns ? ?  ?   ? ? ?Walk 150 feet activity ? ? ?Assist Walk 150 feet activity did not occur: Safety/medical concerns ? ?  ?  ?  ? ?Walk 10 feet on uneven surface  ?activity ? ? ?Assist Walk 10 feet on uneven surfaces activity did not  occur: Safety/medical concerns ? ? ?  ?   ? ?Wheelchair ? ? ? ? ?Assist Is the patient using a wheelchair?: Yes ?Type of Wheelchair: Manual ?  ? ?Wheelchair assist level: Dependent - Patient 0% ?   ? ? ?Wheelchair 50 feet with 2 turns activity ? ? ? ?Assist ? ?  ?  ? ? ?Assist Level: Dependent - Patient 0%  ? ?Wheelchair 150 feet activity  ? ? ? ?Assist ?   ? ? ?Assist Level: Dependent - Patient 0%  ? ?Blood pressure 129/69, pulse (!) 54, temperature 97.8 ?F (36.6 ?C), resp. rate 17, height '5\' 6"'$  (1.676 m), weight 84.6 kg, SpO2 99 %. ?Medical Problem List and Plan: ?1. Functional deficits secondary to acute encephalopathy with baseline intellectual disability ? -DNR ?            -patient may shower ?            -ELOS/Goals: SNF now pending ?          -Continue CIR therapies including PT, OT, and SLP    ? -Appreciate Palliative Care assist! ?   ?2.  Antithrombotics: ?-DVT/anticoagulation:  Pharmaceutical: Lovenox ?            -antiplatelet therapy: N/A ?3. Pain Management: Tylenol as needed ?4. Mood: Provide emotional support ?            -antipsychotic agents: N/A ?5. Neuropsych: This patient is not capable of making decisions on his own behalf. ?6. Skin/Wound Care: Routine skin  checks ?7. Fluids/Electrolytes/Nutrition:  ? -appetite seems reasonable so far ?8.  Recurrent aspiration pneumonia.  Antibiotic therapy completed 09/06/2021.  Currently on dysphagia #1 nectar thick liquid diet.  Follow-up speech therapy ? -has occ cough when eating but is easily able to clear.  ? - sitting up at 90 deg for meals ? -family has been made aware of aspiration precautions--palliative care f/u.  ? -recent cxr unremarkable. Lungs remain clear ?9.  Hypotension.  ProAmatine 5 mg 3 times daily.  Monitor with increased mobility.    ? -bp controlled 5/6 ? -5/15 bp controlled ?10.  Diabetes mellitus.  Latest hemoglobin A1c 6.4.  SSI.  Patient on Glucophage 1000 mg daily prior to admission.  Resume as needed.  ?-CBG (last 3)  ?Recent Labs   ?  09/18/21 ?1643 09/18/21 ?2112 09/19/21 ?08756 ?GLUCAP 103* 128* 80  ? ? -5/15 low CBGs in AM, not on any diabetic meds, asymptomatic ?-dc cbg checks except am ?11.  Normocytic anemia.  Patient with hematology work-up in the past.  Follow-up CBC ? -had one + test in March, still waiting on stool sample ? -5/9 hgb up to 9.3, no s/s blood loss ? -Repeat labs tuesday  ?12.  Hyperlipidemia.  Patient on Lopid/Lipitor prior to admission.  Currently on hold due to dysphagia ?13. Recurrent hypothermia with unclear cause ?            -asymptomatic, no associated sx ? -not sure that these temp readings are accurate ? -no further action indicated at this time ?14. AKI on CKD stage IIIa. improved continue to monitor ? -Cr stable/in range-5/9 ? -Advised fluid intake ? -Repeat labs tuesday ?15. GERD. Continue PPI ?16. Bradycardia. Seen by cardiology. Continue to monitor ? -Heart rate remains in 40-50'ss.  Patient asymptomatic ? -5/15 no changes ? ?LOS: ?12 days ?A FACE TO FACE EVALUATION WAS PERFORMED ? ?Meredith Staggers ?09/19/2021, 9:22 AM  ? ?  ?

## 2021-09-19 NOTE — Progress Notes (Signed)
Occupational Therapy Session Note ? ?Patient Details  ?Name: Daniel Bailey. ?MRN: [id number] ?Date of Birth: 07-04-1950 ? ?Today's Date: 09/19/2021 ?OT Individual Time: [phone] ?OT Individual Time Calculation (min): 39 min  ? ? ?Short Term Goals: ?Week 2:  OT Short Term Goal 1 (Week 2): STG=LTG d/t ELOS ? ?Skilled Therapeutic Interventions/Progress Updates:  ?  Pt received supine with no c/o pain, agreeable to OT session. He competde bed mobility with (S). Initially had difficulty standing but with cueing for placing one hand on the bed he was able to stand with close (S). He ambulated into the bathroom with the RW, requiring cueing for RW management but no physical assist other than (S) and CGA during turns for safety. He completed toileting tasks with close (S), no urine void despite attempt. He returned to the recliner to eat breakfast. Nectar thick liquids poured into open cups and tray set up. Pt then consumed D1 sausage and pancake with min cueing for alternating liquids/solids and intermittent throat clear. He consumed 100% of his breakfast. 2x coughing episode. Pt was left sitting up in the recliner with all needs met, chair alarm set, and call bell within reach.  ? ? ?Therapy Documentation ?Precautions:  ?Precautions ?Precautions: Fall ?Restrictions ?Weight Bearing Restrictions: No ? ?Therapy/Group: Individual Therapy ? ?Curtis Sites ?09/19/2021, 6:20 AM ?

## 2021-09-19 NOTE — Progress Notes (Signed)
Physical Therapy Session Note ? ?Patient Details  ?Name: Daniel Bailey. ?MRN: [id number] ?Date of Birth: 10-Jul-1950 ? ?Today's Date: 09/19/2021 ?PT Individual Time: 0850-1005 and 1305-1400 ?PT Individual Time Calculation (min): 75 min and 55 min ? ?Short Term Goals: ?Week 2:  PT Short Term Goal 1 (Week 2): STG=LTG due to patient pending SNF placement. ? ?Skilled Therapeutic Interventions/Progress Updates:  ?   ?Session 1: ?Patient in recliner upon PT arrival. Patient alert and agreeable to PT session. Patient denied pain during session. ? ?Therapeutic Activity: ?Transfers: Patient performed sit to/from stand x11 with supervision without AD. Provided verbal cues for scooting forward x1 and forward weight shift x3. ?Patient performed an ambulatory transfer to/from the bathroom with close supervision. Patient was continent of bladder during toileting, performed peri-care and lower body clothing management with set-up assist-supervision.  ? ?Gait Training:  ?Patient ambulated >150 feet, 214 feet, and  347 feet without an AD with CGA-close supervision throughout. Ambulated with decreased gait speed, decreased step length and height R>L, increased BOS, increased B toe out, forward trunk lean, and downward head gaze. Provided verbal cues for erect posture, looking ahead, increased gait speed and arm swing for improved balance, and increased R foot clearance with fatigue. ? ?Neuromuscular Re-ed: ?Patient performed the following dynamic standing balance/gait activities with supervision-intermittent CGA: ?-patient retrieved 10 bean bags placed at different levels throughout 1/2 the main gym, x1 up/down 4 steps with 1 rail, and held a bin to place them in with CGA and min A x1 for minor LOB while reaching over head x7 min 40 sec, required mod-max cues to locate bags ?-standing balance while cleaning bean bags, donned/doffed gloves for task with min cues, placed bean bags in clean been placed behind and to the L to promote trunk  rotation with task x1 min and 3 min in standing ?-stand performing the twist, marching, and 360 deg turns R/L x2 for the full song "the twist" by [employer] ? ?Patient required frequent rest breaks between activities. Provided patient with nectar thick water during session, drank during rest breaks with mod cues for small sips and intermittent dry swallow, coughing x1 followed by dry swallow and throat clear with cues. ? ?Patient in recliner at end of session with breaks locked, seat belt alarm set, and all needs within reach.  ? ?Session 2: ?Patient in recliner asleep upon PT arrival. Patient easily aroused and agreeable to PT session. Patient reported having a headache, stated "it's up there," when asked to provide numerical rating, during session, RN made aware. PT provided repositioning, rest breaks, and distraction as pain interventions throughout session.  ? ?Patient reported dizziness and headache with standing at beginning of session. ?Vitals: BP 107/56, HR 57, SPO2 100% ? ?Patient reported increased fatigue and headache this session, but requested to go outside during session. Patient performed stand pivot recliner<>w/c with CGA for steadying support without AD. Patient transported outside of the [facility] for therapeutic discussion about activities of leisure, to increase mental and physical stimulation and mobility throughout the day at home. Patient reports minimal interest in gardening, but stated that he did take out the garbage and could retrieve the mail each day. Discussed performing tasks like drying dishes, folding clothes, or doing a puzzle in standing with supervision, and reading, doing word searches, and coloring for seated activities for increased mental stimulation. Patient very participative in discussion and provided antidotes about activities he has enjoyed in the past.  ? ?He ambulated >100 feet with CGA-min A  without and AD with notable fatigue and L knee instability over unlevel  side-walk, denied dizziness during ambulation.  Ambulated as above, with increased trunk flexion and intermittent standing rest breaks with L knee shaking or R toe catching due to fatigue.  ? ?Returned patient to his room and provided him with a word search, a coloring book, and markers for increased stimulation between therapy sessions in the room, patient very appreciative.  ? ?Patient in recliner at end of session with breaks locked, seat belt alarm set, and all needs within reach. Patient missed 20 min of skilled PT due to fatigue, RN made aware. Will attempt to make-up missed time as able.   ? ?Therapy Documentation ?Precautions:  ?Precautions ?Precautions: Fall ?Restrictions ?Weight Bearing Restrictions: No ?General: ?PT Amount of Missed Time (min): 20 Minutes ?PT Missed Treatment Reason: Patient fatigue ? ? ? ?Therapy/Group: Individual Therapy ? ?Doreene Burke PT, DPT ? ?09/19/2021, 5:45 PM  ?

## 2021-09-20 LAB — BASIC METABOLIC PANEL
Anion gap: 7 (ref 5–15)
BUN: 35 mg/dL — ABNORMAL HIGH (ref 8–23)
CO2: 29 mmol/L (ref 22–32)
Calcium: 9.3 mg/dL (ref 8.9–10.3)
Chloride: 102 mmol/L (ref 98–111)
Creatinine, Ser: 1.29 mg/dL — ABNORMAL HIGH (ref 0.61–1.24)
GFR, Estimated: 59 mL/min — ABNORMAL LOW (ref >60)
Glucose, Bld: 77 mg/dL (ref 70–99)
Potassium: 4.5 mmol/L (ref 3.5–5.1)
Sodium: 138 mmol/L (ref 135–145)

## 2021-09-20 LAB — GLUCOSE, CAPILLARY
Glucose-Capillary: 74 mg/dL (ref 70–99)
Glucose-Capillary: 101 mg/dL — ABNORMAL HIGH (ref 70–99)
Glucose-Capillary: 106 mg/dL — ABNORMAL HIGH (ref 70–99)
Glucose-Capillary: 131 mg/dL — ABNORMAL HIGH (ref 70–99)

## 2021-09-20 LAB — CBC
HCT: 29.4 % — ABNORMAL LOW (ref 39.0–52.0)
Hemoglobin: 9.5 g/dL — ABNORMAL LOW (ref 13.0–17.0)
MCH: 31.1 pg (ref 26.0–34.0)
MCHC: 32.3 g/dL (ref 30.0–36.0)
MCV: 96.4 fL (ref 80.0–100.0)
Platelets: 312 10*3/uL (ref 150–400)
RBC: 3.05 MIL/uL — ABNORMAL LOW (ref 4.22–5.81)
RDW: 17.2 % — ABNORMAL HIGH (ref 11.5–15.5)
WBC: 3.7 10*3/uL — ABNORMAL LOW (ref 4.0–10.5)
nRBC: 0 % (ref 0.0–0.2)

## 2021-09-20 MED ORDER — FERROUS SULFATE 325 (65 FE) MG PO TABS
325.0000 mg | ORAL_TABLET | Freq: Every day | ORAL | Status: DC
  Administered 2021-09-20 – 2021-09-26 (×7): 325 mg via ORAL
  Filled 2021-09-20 (×7): qty 1

## 2021-09-20 MED ORDER — SODIUM CHLORIDE 0.45 % IV SOLN: INTRAVENOUS | Status: AC

## 2021-09-20 NOTE — Progress Notes (Signed)
Patient restful throughout shift denies pain, no acute distress or discomfort noted, Continue current regime, call bell within reach, bed alarm on., monitor and assisted  ?

## 2021-09-20 NOTE — Progress Notes (Signed)
Patient ID: Daniel Bailey., male   DOB: [birth year], 71 y.o.   MRN: [id number] ? ?11:50am- SW spoke pt cousin/POA- Daniel Bailey to provide updates from team conference on gains made, and continuing to wait for the state to perform their assessment for SNF appropriateness. SW informed will follow-up once there is more information.  ? ?Loralee Pacas, MSW, LCSWA ?Office: [phone] ?Cell: [phone] ?Fax: [phone]  ?

## 2021-09-20 NOTE — Progress Notes (Signed)
Physical Therapy Discharge Summary  Patient Details  Name: Daniel Bailey. MRN: [id number] Date of Birth: 02-Jan-1951   Patient has met 7 of 7 long term goals due to improved activity tolerance, improved balance, improved postural control, increased strength, ability to compensate for deficits, and improved coordination.  Patient to discharge at an ambulatory level [place].   Patient's care partner unavailable to provide the necessary physical and cognitive assistance at discharge, plan for d/c to SNF for patient to continue to progress to level that family is able to support.  Reasons goals not met: n/a  Recommendation:  Patient will benefit from ongoing skilled PT services in skilled nursing facility setting to continue to advance safe functional mobility, address ongoing impairments in balance, strength, activity tolerance, functional mobility, gait and stair training, and minimize fall risk.  Equipment: No equipment provided, equipment to be provided at next level of care  Reasons for discharge: treatment goals met and discharge from hospital  Patient/family agrees with progress made and goals achieved: Yes  PT Discharge Precautions/Restrictions Precautions Precautions: Fall Restrictions Weight Bearing Restrictions: No Pain Interference Pain Interference Pain Effect on Sleep: 0. Does not apply - I have not had any pain or hurting in the past 5 days Pain Interference with Therapy Activities: 1. Rarely or not at all Pain Interference with Day-to-Day Activities: 1. Rarely or not at all Vision/Perception  Vision - History Ability to See in Adequate Light: 0 Adequate Vision - Assessment Eye Alignment: Within Functional Limits Ocular Range of Motion: Within Functional Limits Alignment/Gaze Preference: Within Defined Limits Tracking/Visual Pursuits: Able to track stimulus in all quads without difficulty;Requires cues, head turns, or add eye shifts to  track Perception Perception: Within Functional Limits Praxis Praxis: Intact  Cognition Overall Cognitive Status: History of cognitive impairments - at baseline Arousal/Alertness: Awake/alert Orientation Level: Oriented to person;Oriented to place;Oriented to situation;Disoriented to time Sustained Attention: Appears intact Divided Attention: Impaired Memory: Impaired Memory Impairment: Decreased recall of new information;Decreased short term memory Decreased Short Term Memory: Verbal basic;Functional basic Awareness: Impaired Awareness Impairment: Emergent impairment Initiating: Impaired Safety/Judgment: Appears intact Sensation Sensation Light Touch: Appears Intact Proprioception: Appears Intact Coordination Gross Motor Movements are Fluid and Coordinated: No Fine Motor Movements are Fluid and Coordinated: No Coordination and Movement Description: generalized deconditioning, decreased postural control, hypometria Heel Shin Test: slow and deliberate in available ROM bilaterally Motor  Motor Motor: Abnormal postural alignment and control Motor - Discharge Observations: improved coordination, however remains slow and deliberate  Mobility Bed Mobility Rolling Right: Supervision/verbal cueing Rolling Left: Supervision/Verbal cueing Supine to Sit: Supervision/Verbal cueing;Independent with assistive device Sit to Supine: Supervision/Verbal cueing;Independent with assistive device Transfers Transfers: Sit to Stand;Stand to Sit;Stand Pivot Transfers Sit to Stand: Supervision/Verbal cueing Stand to Sit: Supervision/Verbal cueing Stand Pivot Transfers: Supervision/Verbal cueing Transfer (Assistive device): None Locomotion  Gait Ambulation: Yes Gait Assistance: Contact Guard/Touching assist Gait Distance (Feet): 396 Feet (time: 3:50) Assistive device: None Gait Assistance Details: Verbal cues for technique Gait Gait: Yes Gait Pattern: Impaired Gait Pattern: Decreased step  length - right;Decreased step length - left;Decreased stride length;Trunk flexed;Step-through pattern;Decreased hip/knee flexion - right;Decreased hip/knee flexion - left;Wide base of support;Decreased trunk rotation Gait velocity: decreased Stairs / Additional Locomotion Stairs: Yes Stairs Assistance: Contact Guard/Touching assist Stair Management Technique: Two rails Number of Stairs: 4 Height of Stairs: 6 Ramp: Minimal Assistance - Patient >75% Curb: Minimal Assistance - Patient >75% Wheelchair Mobility Wheelchair Mobility: No  Trunk/Postural Assessment  Cervical Assessment Cervical Assessment: Within Functional Limits Thoracic Assessment  Thoracic Assessment: Exceptions to [facility] (shoulders rounded) Lumbar Assessment Lumbar Assessment: Exceptions to [facility] (post tilt) Postural Control Postural Control: Deficits on evaluation Righting Reactions: delayed, but improved since eval  Balance Standardized Balance Assessment Standardized Balance Assessment: Berg Balance Test Berg Balance Test Sit to Stand: Able to stand without using hands and stabilize independently Standing Unsupported: Able to stand safely 2 minutes Sitting with Back Unsupported but Feet Supported on Floor or Stool: Able to sit safely and securely 2 minutes Stand to Sit: Sits safely with minimal use of hands Transfers: Able to transfer safely, minor use of hands Standing Unsupported with Eyes Closed: Able to stand 10 seconds safely Standing Ubsupported with Feet Together: Able to place feet together independently and stand for 1 minute with supervision From Standing, Reach Forward with Outstretched Arm: Can reach forward >5 cm safely (2") From Standing Position, Pick up Object from Floor: Able to pick up shoe, needs supervision From Standing Position, Turn to Look Behind Over each Shoulder: Looks behind from both sides and weight shifts well Turn 360 Degrees: Able to turn 360 degrees safely but slowly Standing  Unsupported, Alternately Place Feet on Step/Stool: Able to complete 4 steps without aid or supervision Standing Unsupported, One Foot in Front: Able to plae foot ahead of the other independently and hold 30 seconds Standing on One Leg: Tries to lift leg/unable to hold 3 seconds but remains standing independently Total Score: 44 Static Sitting Balance Static Sitting - Balance Support: Feet supported;No upper extremity supported Static Sitting - Level of Assistance: 7: Independent Dynamic Sitting Balance Dynamic Sitting - Balance Support: Feet supported;No upper extremity supported Dynamic Sitting - Level of Assistance: 7: Independent Dynamic Sitting - Balance Activities: Forward lean/weight shifting Static Standing Balance Static Standing - Balance Support: No upper extremity supported;During functional activity Static Standing - Level of Assistance: 5: Stand by assistance Dynamic Standing Balance Dynamic Standing - Balance Support: During functional activity;No upper extremity supported Dynamic Standing - Level of Assistance: 5: Stand by assistance;4: Min assist Extremity Assessment  RLE Assessment RLE Assessment: Exceptions to [facility] Active Range of Motion (AROM) Comments: limited hip flexion in sitting, otherwise WFL for functional mobility RLE Strength RLE Overall Strength: Deficits Right Hip Flexion: 4/5 Right Hip ABduction: 4/5 Right Hip ADduction: 4/5 Right Knee Flexion: 4+/5 Right Knee Extension: 4+/5 Right Ankle Dorsiflexion: 4+/5 Right Ankle Plantar Flexion: 4+/5 LLE Assessment LLE Assessment: Exceptions to [facility] Active Range of Motion (AROM) Comments: limited hip flexion in sitting, otherwise WFL for functional mobility LLE Strength LLE Overall Strength: Deficits Left Hip Flexion: 4/5 Left Hip ABduction: 4/5 Left Hip ADduction: 4/5 Left Knee Flexion: 4+/5 Left Knee Extension: 4+/5 Left Ankle Dorsiflexion: 4+/5 Left Ankle Plantar Flexion: 4+/5    Quinzell Malcomb L Allyna Pittsley  PT, DPT  09/20/2021, 9:31 AM

## 2021-09-20 NOTE — Progress Notes (Signed)
Speech Language Pathology Daily Session Note ? ?Patient Details  ?Name: Daniel Bailey. ?MRN: [id number] ?Date of Birth: July 27, 1950 ? ?Today's Date: 09/20/2021 ?SLP Individual Time: 2876-8115 ?SLP Individual Time Calculation (min): 30 min ? ?Short Term Goals: ?Week 2: SLP Short Term Goal 1 (Week 2): Patient will consume current diet with Min verbal cues for use of swallowing compensatory strategies. ?SLP Short Term Goal 2 (Week 2): Patient will self-monitor and correct wet vocal quality with a throat clear and re-swallow with Min verbal cues. ?SLP Short Term Goal 3 (Week 2): Patient will recall swallowing compensatory strategies with 75% accuracy with Min verbal cues. ? ?Skilled Therapeutic Interventions: Skilled treatment session focused on dysphagia goals. SLP facilitated session by providing skilled observation with a snack of Dys. 1 textures with nectar-thick liquids. Patient required supervision level question cues to recall swallowing compensatory strategies as well as to implement them during PO intake. Patient with one overt coughing episode today, suspect due to increased verbosity during PO intake. Recommend patient continue current diet with full supervision. Patient handed off to PT. Continue with current plan of care.  ?   ? ?Pain ?No/Denies Pain  ? ?Therapy/Group: Individual Therapy ? ?Anjuli Gemmill ?09/20/2021, 9:10 AM ?

## 2021-09-20 NOTE — Progress Notes (Signed)
?                                                       PROGRESS NOTE ? ? ?Subjective/Complaints: ?No new complaints. Feels well. No pain.  ? ? ?Review of Systems  ?Constitutional: Negative.  Negative for chills, fever and malaise/fatigue.  ?HENT: Negative.    ?Respiratory:  Negative for cough and shortness of breath.   ?Cardiovascular: Negative.  Negative for chest pain.  ?Gastrointestinal: Negative.  Negative for nausea.  ?Genitourinary: Negative.   ?Musculoskeletal:  Negative for myalgias.  ?Skin: Negative.   ?Neurological: Negative.  Negative for dizziness.  ?Psychiatric/Behavioral: Negative.  Negative for depression.    ? ? ?Objective: ?  ?No results found. ?Recent Labs  ?  09/20/21 ?0350  ?WBC 3.7*  ?HGB 9.5*  ?HCT 29.4*  ?PLT 312  ? ? ?Recent Labs  ?  09/20/21 ?0938  ?NA 138  ?K 4.5  ?CL 102  ?CO2 29  ?GLUCOSE 77  ?BUN 35*  ?CREATININE 1.29*  ?CALCIUM 9.3  ? ? ? ?Intake/Output Summary (Last 24 hours) at 09/20/2021 0912 ?Last data filed at 09/20/2021 0910 ?Gross per 24 hour  ?Intake 1120 ml  ?Output 300 ml  ?Net 820 ml  ? ?  ? ?  ? ?Physical Exam: ?Vital Signs ?Blood pressure 115/64, pulse (!) 46, temperature (!) 97.2 ?F (36.2 ?C), resp. rate 18, height '5\' 6"'$  (1.676 m), weight 82 kg, SpO2 99 %. ? ?Constitutional: No distress . Vital signs reviewed. ?HEENT: NCAT, EOMI, oral membranes moist ?Neck: supple ?Cardiovascular: RRR without murmur. No JVD    ?Respiratory/Chest: CTA Bilaterally without wheezes or rales. Normal effort    ?GI/Abdomen: BS +, non-tender, non-distended ?Ext: no clubbing, cyanosis, or edema ?Psych: pleasant and cooperative  ?Neuro:   Patient is alert and oriented to self. Patient was attentive and can follow simple commands. More awareness. Moves all 4 extremities without limitations. ?Speech clear and likely at baseline ?Skin is warm and dry ?Musculoskeletal: Full ROM, No pain with AROM or PROM in the neck, trunk, or extremities. Posture appropriate  ? ? ?Assessment/Plan: ?1. Functional  deficits which require 3+ hours per day of interdisciplinary therapy in a comprehensive inpatient rehab setting. ?Physiatrist is providing close team supervision and 24 hour management of active medical problems listed below. ?Physiatrist and rehab team continue to assess barriers to discharge/monitor patient progress toward functional and medical goals ? ?Care Tool: ? ?Bathing ?   ?Body parts bathed by patient: Right arm, Left arm, Chest, Abdomen, Front perineal area, Buttocks, Right upper leg, Left upper leg, Right lower leg, Left lower leg, Face  ? Body parts bathed by helper: Right arm ?  ?  ?Bathing assist Assist Level: Contact Guard/Touching assist ?  ?  ?Upper Body Dressing/Undressing ?Upper body dressing   ?What is the patient wearing?: Pull over shirt ?   ?Upper body assist Assist Level: Minimal Assistance - Patient > 75% ?   ?Lower Body Dressing/Undressing ?Lower body dressing ? ? ?   ?What is the patient wearing?: Pants, Incontinence brief ? ?  ? ?Lower body assist Assist for lower body dressing: Moderate Assistance - Patient 50 - 74% ?   ? ?Toileting ?Toileting    ?Toileting assist Assist for toileting: Minimal Assistance - Patient > 75% ?  ?  ?Transfers ?Chair/bed transfer ? ?  Transfers assist ?   ? ?Chair/bed transfer assist level: Minimal Assistance - Patient > 75% ?  ?  ?Locomotion ?Ambulation ? ? ?Ambulation assist ? ?   ? ?Assist level: Moderate Assistance - Patient 50 - 74% ?Assistive device: Walker-rolling ?Max distance: 50f  ? ?Walk 10 feet activity ? ? ?Assist ?   ? ?Assist level: Moderate Assistance - Patient - 50 - 74% ?Assistive device: Hand held assist, Other (comment), Walker-rolling  ? ?Walk 50 feet activity ? ? ?Assist Walk 50 feet with 2 turns activity did not occur: Safety/medical concerns ? ?  ?   ? ? ?Walk 150 feet activity ? ? ?Assist Walk 150 feet activity did not occur: Safety/medical concerns ? ?  ?  ?  ? ?Walk 10 feet on uneven surface  ?activity ? ? ?Assist Walk 10 feet on  uneven surfaces activity did not occur: Safety/medical concerns ? ? ?  ?   ? ?Wheelchair ? ? ? ? ?Assist Is the patient using a wheelchair?: Yes ?Type of Wheelchair: Manual ?  ? ?Wheelchair assist level: Dependent - Patient 0% ?   ? ? ?Wheelchair 50 feet with 2 turns activity ? ? ? ?Assist ? ?  ?  ? ? ?Assist Level: Dependent - Patient 0%  ? ?Wheelchair 150 feet activity  ? ? ? ?Assist ?   ? ? ?Assist Level: Dependent - Patient 0%  ? ?Blood pressure 115/64, pulse (!) 46, temperature (!) 97.2 ?F (36.2 ?C), resp. rate 18, height '5\' 6"'$  (1.676 m), weight 82 kg, SpO2 99 %. ?Medical Problem List and Plan: ?1. Functional deficits secondary to acute encephalopathy with baseline intellectual disability ? -DNR ?            -patient may shower ?            -ELOS/Goals: SNF now pending ?         -Continue CIR therapies including PT, OT, and SLP. Interdisciplinary team conference today to discuss goals, barriers to discharge, and dc planning.   ? -Appreciate Palliative Care assist! ?   ?2.  Antithrombotics: ?-DVT/anticoagulation:  Pharmaceutical: Lovenox ?            -antiplatelet therapy: N/A ?3. Pain Management: Tylenol as needed ?4. Mood: Provide emotional support ?            -antipsychotic agents: N/A ?5. Neuropsych: This patient is not capable of making decisions on his own behalf. ?6. Skin/Wound Care: Routine skin  checks ?7. Fluids/Electrolytes/Nutrition:  ? -appetite seems reasonable so far ?8.  Recurrent aspiration pneumonia.  Antibiotic therapy completed 09/06/2021.  Currently on dysphagia #1 nectar thick liquid diet.  Follow-up speech therapy ? -has occ cough when eating but is easily able to clear.  ? - sitting up at 90 deg for meals ? -family has been made aware of aspiration precautions--palliative care f/u.  ? -recent cxr unremarkable. Lungs remain clear. Seems to be handling well.  ?9.  Hypotension.  ProAmatine 5 mg 3 times daily.  Monitor with increased mobility.    ?  -5/16 bp controlled ?10.  Diabetes mellitus.   Latest hemoglobin A1c 6.4.  SSI.  Patient on Glucophage 1000 mg daily prior to admission.  Resume as needed.  ?-CBG (last 3)  ?Recent Labs  ?  09/19/21 ?1648 09/19/21 ?2128 09/20/21 ?0642  ?GLUCAP 104* 181* 74  ? ? 5/16 have changed cbgs to am only ?11.  Normocytic anemia.  Patient with hematology work-up in the past.  Follow-up CBC ? -  had one + test in March  ? -5/16 hgb up to 9.5 but likely concentrated ?12.  Hyperlipidemia.  Patient on Lopid/Lipitor prior to admission.  Currently on hold due to dysphagia ?13. Recurrent hypothermia with unclear cause ?            -asymptomatic, no associated sx ? -not sure that these temp readings are accurate ? -no further action indicated at this time ?14. AKI on CKD stage IIIa. improved continue to monitor ? -Cr stable to sl elevated ? -BUN up to 35 ? -will begin 1/2ns at HS ? -push fluids ?15. GERD. Continue PPI ?16. Bradycardia. Seen by cardiology. Continue to monitor ? -  Patient asymptomatic ? -5/16 no changes ? ?LOS: ?13 days ?A FACE TO FACE EVALUATION WAS PERFORMED ? ?Meredith Staggers ?09/20/2021, 9:12 AM  ? ?  ?

## 2021-09-20 NOTE — Progress Notes (Signed)
Occupational Therapy Session Note ? ?Patient Details  ?Name: Daniel Bailey. ?MRN: [id number] ?Date of Birth: 12-15-1950 ? ?Today's Date: 09/20/2021 ?OT Individual Time: 8786-7672 ?OT Individual Time Calculation (min): 45 min  ? ?Today's Date: 09/20/2021 ?OT Individual Time: 0947-0962 ?OT Individual Time Calculation (min): 60 min  ?Short Term Goals: ?Week 2:  OT Short Term Goal 1 (Week 2): STG=LTG d/t ELOS ? ?Skilled Therapeutic Interventions/Progress Updates:  ?   ?Pt received in bed with no pain reported. ? ?ADL: ?Pt completes footwear with supervision to don teds and non skid socks with mod instructional cuing to use bag over foot to don teds with decreased friction. Pt able to don non skid socks with no cuing. Transfers to recliner iniitally ignoring cuing for hand placement to power up from EOB trying to pull up on walker with poor anterior weight shift with uncontrolled plop back to EOB. Pt requires MAX cuing to push up from bed and power up with supervision to transfer into recliner ? ?Pt completes self feeding in supported sitting position position with min cuing for bite size and laternating liquids and solids cuing for swallowing stratiegies as outlined in SLP plan while consuming Dys 1 textures and  nectar thick liqiuids. No overt s/s of aspirtaion. no adaptive feeding utensils required self feeding/ hand to mouth excursion, but initially requiring cuing for initiation and opening packages/containers. ? ?Pt left at end of session in recliner with exit alarm on, call light in reach and all needs met ?Session 2: ? ?Pt received in bed with 0 out of 10 pain ?ADL: ?Pt completes ADL at overall supervision Level. Skilled interventions include: increased time for pt to process cuing for hand placement during transitional movements and MIN cuing for adaptive LB dressing strategies. Pt is able to trasnfer with supervision using RW for ambulation into and out of the bathroom wth improved feet inside RW during turns this  date. Pt overall continues to increase functional performance.  ? ?Therapeutic activity ?9HPT  ?RUE 1 min 25 seconds ?LUE 1 min 25 seconds ? ?Pt left at end of session in recliner with exit alarm on, call light in reach and all needs met ? ? ?Therapy Documentation ?Precautions:  ?Precautions ?Precautions: Fall ?Restrictions ?Weight Bearing Restrictions: No ?General: ?  ? ? ?Therapy/Group: Individual Therapy ? ?Lowella Dell Amila Callies ?09/20/2021, 6:47 AM ?

## 2021-09-20 NOTE — Progress Notes (Signed)
Physical Therapy Session Note ? ?Patient Details  ?Name: Daniel Bailey. ?MRN: [id number] ?Date of Birth: June 18, 1950 ? ?Today's Date: 09/20/2021 ?PT Individual Time: 8563-1497 ?PT Individual Time Calculation (min): 72 min  ? ?Short Term Goals: ?Week 2:  PT Short Term Goal 1 (Week 2): STG=LTG due to patient pending SNF placement. ? ?Skilled Therapeutic Interventions/Progress Updates:  ?   ?Patient in recliner in the room upon PT arrival. Patient alert and agreeable to PT session. Patient denied pain during session. ? ?Focused session on reassessment of mobility, balance assessment, and d/c items due to pending d/c to SNF. Patient with excellent progress from admission scores, however, he continues to present at increased fall risk and require supervision-CGA for safety with all mobility.  ? ?Therapeutic Activity: ?Bed Mobility: Patient performed rolling R/L and supine to/from sit independently in the ADL bed.  ?Transfers: Patient performed sit to/from stand from various household surfaces, including the ADL couch and bed, with supervision without AD. Provided verbal cues for forward weight shift x2. ?Patient performed a simulated sedan height car transfer with supervision using the car frame for steadying support. Provided cues for safe technique. ? ?Gait Training:  ?Patient ambulated >150 feet, >50 feet, and >100 feet x2 without an AD with CGA-close supervision. Ambulated with decreased gait speed, decreased step length and height R>L, increased BOS, increased B toe out, forward trunk lean, and downward head gaze. Provided verbal cues for erect posture, looking ahead, increased gait speed and arm swing for improved balance, and increased R foot clearance with fatigue. ?Patient ascended/descended 4x6" steps using B rails with CGA for lower extremity strengthening and progress toward community access. Performed step-to gait pattern throughout. Provided cues for technique and sequencing.  ?Patient ambulated up/down a ramp,  over 10 feet of mulch (unlevel surface), and up/down a curb to simulate community ambulation over unlevel surfaces with min A and intermittent use of railing for steadying support. Provided cues for technique and changes in surfaces. ? ?Neuromuscular Re-ed: ?Berg Balance Test ?Sit to Stand: Able to stand without using hands and stabilize independently ?Standing Unsupported: Able to stand safely 2 minutes ?Sitting with Back Unsupported but Feet Supported on Floor or Stool: Able to sit safely and securely 2 minutes ?Stand to Sit: Sits safely with minimal use of hands ?Transfers: Able to transfer safely, minor use of hands ?Standing Unsupported with Eyes Closed: Able to stand 10 seconds safely ?Standing Ubsupported with Feet Together: Able to place feet together independently and stand for 1 minute with supervision ?From Standing, Reach Forward with Outstretched Arm: Can reach forward >5 cm safely (2") ?From Standing Position, Pick up Object from Floor: Able to pick up shoe, needs supervision ?From Standing Position, Turn to Look Behind Over each Shoulder: Looks behind from both sides and weight shifts well ?Turn 360 Degrees: Able to turn 360 degrees safely but slowly ?Standing Unsupported, Alternately Place Feet on Step/Stool: Able to complete 4 steps without aid or supervision ?Standing Unsupported, One Foot in Front: Able to plae foot ahead of the other independently and hold 30 seconds ?Standing on One Leg: Tries to lift leg/unable to hold 3 seconds but remains standing independently ?Total Score: 44/56 ?Patient demonstrated increased fall risk noted by score of 44/56 on the [facility] Scale.  ?<45/56 = fall risk, <42/56 = predictive of recurrent falls, <40/56 = 100% fall risk  ?>41 = independent, 21-40 = assistive device, 0-20 = wheelchair level  ?MDC 6.9 (4 pts 45-56, 5 pts 35-44, 7 pts 25-34) ?(ANPTA  Core Set of Outcome Measures for Adults with Neurologic Conditions, 2018) ? ?Patient in recliner in the room at  end of session with breaks locked, seat belt alarm set, and all needs within reach.  ? ?Therapy Documentation ?Precautions:  ?Precautions ?Precautions: Fall ?Restrictions ?Weight Bearing Restrictions: No ? ? ? ?Therapy/Group: Individual Therapy ? ?Doreene Burke PT, DPT ? ?09/20/2021, 12:51 PM  ?

## 2021-09-20 NOTE — Patient Care Conference (Signed)
Inpatient RehabilitationTeam Conference and Plan of Care Update ?Date: 09/20/2021   Time: 10:02 AM  ? ? ?Patient Name: Daniel Bailey.      ?Medical Record Number: [id number]  ?Date of Birth: 10-09-1950 ?Sex: Male         ?Room/Bed: 4M05C/4M05C-01 ?Payor Info: Payor: MEDICARE / Plan: MEDICARE PART A AND B / Product Type: *No Product type* /   ? ?Admit Date/Time:  09/07/2021 12:57 PM ? ?Primary Diagnosis:  Acute encephalopathy ? ?Hospital Problems: Principal Problem: ?  Acute encephalopathy ? ? ? ?Expected Discharge Date: Expected Discharge Date:  (SNF) ? ?Team Members Present: ?Physician leading conference: Dr. Alger Simons ?Social Worker Present: Loralee Pacas, LCSWA ?Nurse Present: Dorthula Nettles, RN ?PT Present: Apolinar Junes, PT ?OT Present: Mariane Masters, OT ?SLP Present: Weston Anna, SLP ?PPS Coordinator present : Gunnar Fusi, SLP ? ?   Current Status/Progress Goal Weekly Team Focus  ?Bowel/Bladder ? ? Continent of bladder and bowels, occassional incontinent episodes  Remain continent  Assess toileting needs q 2 hrs and prn   ?Swallow/Nutrition/ Hydration ? ? Dys. 1 textures with nectar-thick liquids, Min-Mod A verbal cues for use of swallowing compensatory strategies.  Min A  tolerance of current diet, increased use of swallowing compensatory strategies   ?ADL's ? ? CGA- supervision overall. Improved activity tolerance and RW management  Supervision overall  ADL retraining, sequencing/motor planning, transfers, functoinal activity tolerance   ?Mobility ? ? CGA-supervision overall, intermittent min A without AD, gait >300 feet, Berg 38/56 on 5/11  Supervision overall  Activity tolerance, gait trainin without AD, balance, safety awareness, patient/caregiver education   ?Communication ? ?           ?Safety/Cognition/ Behavioral Observations ?           ?Pain ? ? Denies pain  < 3, continue to monitor  Assess QS and prn   ?Skin ? ? Skin intact  Maintain skin integrity  Assess QS/ PRN, address new  issues   ? ? ?Discharge Planning:  ?D/C pending state assessment (Level II.NCPASRR) to ensure patient appropriate for SNF placement.   ?Team Discussion: ?Medically stable. Tolerating diet, required IVF for dehydration. Asymptomatic with low grade temperature. Continent/incontinent, needs cueing. No reported pain. Still wait on the state assessment.  ? ?Patient on target to meet rehab goals: ?yes, supervision to min assist goals. At goal level with OT. Able to don ted's. Requires cues for safety. CGA/supervision on level surfaces, walking > 300 ft. Not going to upgrade diet. Will remain on Dys 1, nectar thick liquids. Requires multiple swallows. ? ?*See Care Plan and progress notes for long and short-term goals.  ? ?Revisions to Treatment Plan:  ?Requiring IVF's, adjusting medications. ?  ?Teaching Needs: ?Family education, bowel/bladder management, medication management, transfer/gait goals, etc. ?  ?Current Barriers to Discharge: ?Decreased caregiver support, Home enviroment access/layout, Incontinence, and Lack of/limited family support ? ?Possible Resolutions to Barriers: ?Family education ?State complete assessment ?  ? ? Medical Summary ?Current Status: tolerating diet. pushing fluid. needs ivf. baseline renal insuff ? Barriers to Discharge: Medical stability ?  ?Possible Resolutions to [employer]: daily assessment of labs and pt data ? ? ?Continued Need for Acute Rehabilitation Level of Care: The patient requires daily medical management by a physician with specialized training in physical medicine and rehabilitation for the following reasons: ?Direction of a multidisciplinary physical rehabilitation program to maximize functional independence : Yes ?Medical management of patient stability for increased activity during participation in an intensive rehabilitation regime.:  Yes ?Analysis of laboratory values and/or radiology reports with any subsequent need for medication adjustment and/or medical  intervention. : Yes ? ? ?I attest that I was present, lead the team conference, and concur with the assessment and plan of the team. ? ? ?Dorthula Nettles G ?09/20/2021, 1:20 PM  ? ? ? ? ? ? ?

## 2021-09-21 DIAGNOSIS — Z515 Encounter for palliative care: Secondary | ICD-10-CM

## 2021-09-21 LAB — GLUCOSE, CAPILLARY: Glucose-Capillary: 77 mg/dL (ref 70–99)

## 2021-09-21 NOTE — Progress Notes (Signed)
?                                                       PROGRESS NOTE ? ? ?Subjective/Complaints: ?Pt up in bed. No problems. Feeling well, smiling ? ? ?Review of Systems  ?Constitutional: Negative.  Negative for chills, fever and malaise/fatigue.  ?HENT: Negative.    ?Respiratory:  Negative for cough and shortness of breath.   ?Cardiovascular: Negative.  Negative for chest pain.  ?Gastrointestinal: Negative.  Negative for nausea.  ?Genitourinary: Negative.   ?Musculoskeletal:  Negative for myalgias.  ?Skin: Negative.   ?Neurological: Negative.  Negative for dizziness.  ?Psychiatric/Behavioral: Negative.  Negative for depression.    ? ? ?Objective: ?  ?No results found. ?Recent Labs  ?  09/20/21 ?9604  ?WBC 3.7*  ?HGB 9.5*  ?HCT 29.4*  ?PLT 312  ? ? ?Recent Labs  ?  09/20/21 ?5409  ?NA 138  ?K 4.5  ?CL 102  ?CO2 29  ?GLUCOSE 77  ?BUN 35*  ?CREATININE 1.29*  ?CALCIUM 9.3  ? ? ? ?Intake/Output Summary (Last 24 hours) at 09/21/2021 0912 ?Last data filed at 09/21/2021 0700 ?Gross per 24 hour  ?Intake 1290.47 ml  ?Output --  ?Net 1290.47 ml  ? ?  ? ?  ? ?Physical Exam: ?Vital Signs ?Blood pressure 108/72, pulse (!) 58, temperature 97.6 ?F (36.4 ?C), resp. rate 16, height '5\' 6"'$  (1.676 m), weight 84.8 kg, SpO2 100 %. ? ?Constitutional: No distress . Vital signs reviewed. ?HEENT: NCAT, EOMI, oral membranes moist ?Neck: supple ?Cardiovascular: RRR without murmur. No JVD    ?Respiratory/Chest: CTA Bilaterally without wheezes or rales. Normal effort    ?GI/Abdomen: BS +, non-tender, non-distended ?Ext: no clubbing, cyanosis, or edema ?Psych: pleasant and cooperative  ?Neuro:   Patient is alert and oriented to self. Patient was attentive and can follow simple commands. More awareness. Moves all 4 extremities without limitations. ?Speech clear and likely at baseline ?Skin is warm and dry ?Musculoskeletal: Full ROM, No pain with AROM or PROM in the neck, trunk, or extremities. Posture appropriate  ? ? ?Assessment/Plan: ?1.  Functional deficits which require 3+ hours per day of interdisciplinary therapy in a comprehensive inpatient rehab setting. ?Physiatrist is providing close team supervision and 24 hour management of active medical problems listed below. ?Physiatrist and rehab team continue to assess barriers to discharge/monitor patient progress toward functional and medical goals ? ?Care Tool: ? ?Bathing ?   ?Body parts bathed by patient: Right arm, Left arm, Chest, Abdomen, Front perineal area, Buttocks, Right upper leg, Left upper leg, Right lower leg, Left lower leg, Face  ? Body parts bathed by helper: Right arm ?  ?  ?Bathing assist Assist Level: Supervision/Verbal cueing ?  ?  ?Upper Body Dressing/Undressing ?Upper body dressing   ?What is the patient wearing?: Pull over shirt ?   ?Upper body assist Assist Level: Supervision/Verbal cueing ?   ?Lower Body Dressing/Undressing ?Lower body dressing ? ? ?   ?What is the patient wearing?: Pants ? ?  ? ?Lower body assist Assist for lower body dressing: Supervision/Verbal cueing ?   ? ?Toileting ?Toileting    ?Toileting assist Assist for toileting: Minimal Assistance - Patient > 75% ?  ?  ?Transfers ?Chair/bed transfer ? ?Transfers assist ?   ? ?Chair/bed transfer assist level: Minimal Assistance -  Patient > 75% ?  ?  ?Locomotion ?Ambulation ? ? ?Ambulation assist ? ?   ? ?Assist level: Moderate Assistance - Patient 50 - 74% ?Assistive device: Walker-rolling ?Max distance: 43f  ? ?Walk 10 feet activity ? ? ?Assist ?   ? ?Assist level: Moderate Assistance - Patient - 50 - 74% ?Assistive device: Hand held assist, Other (comment), Walker-rolling  ? ?Walk 50 feet activity ? ? ?Assist Walk 50 feet with 2 turns activity did not occur: Safety/medical concerns ? ?  ?   ? ? ?Walk 150 feet activity ? ? ?Assist Walk 150 feet activity did not occur: Safety/medical concerns ? ?  ?  ?  ? ?Walk 10 feet on uneven surface  ?activity ? ? ?Assist Walk 10 feet on uneven surfaces activity did not occur:  Safety/medical concerns ? ? ?  ?   ? ?Wheelchair ? ? ? ? ?Assist Is the patient using a wheelchair?: Yes ?Type of Wheelchair: Manual ?  ? ?Wheelchair assist level: Dependent - Patient 0% ?   ? ? ?Wheelchair 50 feet with 2 turns activity ? ? ? ?Assist ? ?  ?  ? ? ?Assist Level: Dependent - Patient 0%  ? ?Wheelchair 150 feet activity  ? ? ? ?Assist ?   ? ? ?Assist Level: Dependent - Patient 0%  ? ?Blood pressure 108/72, pulse (!) 58, temperature 97.6 ?F (36.4 ?C), resp. rate 16, height '5\' 6"'$  (1.676 m), weight 84.8 kg, SpO2 100 %. ?Medical Problem List and Plan: ?1. Functional deficits secondary to acute encephalopathy with baseline intellectual disability ? -DNR ?            -patient may shower ?            -ELOS/Goals: SNF now pending ?        -Continue CIR therapies including PT, OT, and SLP  ? -Appreciate Palliative Care assist! ?   ?2.  Antithrombotics: ?-DVT/anticoagulation:  Pharmaceutical: Lovenox ?            -antiplatelet therapy: N/A ?3. Pain Management: Tylenol as needed ?4. Mood: Provide emotional support ?            -antipsychotic agents: N/A ?5. Neuropsych: This patient is not capable of making decisions on his own behalf. ?6. Skin/Wound Care: Routine skin  checks ?7. Fluids/Electrolytes/Nutrition:  ? -appetite seems reasonable so far ?8.  Recurrent aspiration pneumonia.  Antibiotic therapy completed 09/06/2021.  Currently on dysphagia #1 nectar thick liquid diet.  Follow-up speech therapy ? -has occ cough when eating but is easily able to clear.  ? - sitting up at 90 deg for meals ? -family has been made aware of aspiration precautions--palliative care f/u.  ? -seems to be coping, managing ?9.  Hypotension.  ProAmatine 5 mg 3 times daily.  Monitor with increased mobility.    ?  -5/17 bp controlled ?10.  Diabetes mellitus.  Latest hemoglobin A1c 6.4.  SSI.  Patient on Glucophage 1000 mg daily prior to admission.  Resume as needed.  ?-CBG (last 3)  ?Recent Labs  ?  09/20/21 ?1655 09/20/21 ?2119  09/21/21 ?06440 ?GLUCAP 106* 131* 77  ? ? 5/17 have changed cbgs to am only ?11.  Normocytic anemia.  Patient with hematology work-up in the past.  Follow-up CBC ? -had one + test in March  ? -5/16 hgb up to 9.5 but likely concentrated ?12.  Hyperlipidemia.  Patient on Lopid/Lipitor prior to admission.  Currently on hold due to dysphagia ?13. Recurrent  hypothermia with unclear cause ?            -asymptomatic, no associated sx ? -not sure that these temp readings are accurate ? -no further action indicated at this time ?14. AKI on CKD stage IIIa. improved continue to monitor ? -Cr stable to sl elevated ? -BUN up to 35 ? -will begin 1/2ns at HS ? -push fluids ? 5/17 recheck bmet thursday ?15. GERD. Continue PPI ?16. Bradycardia. Seen by cardiology. Continue to monitor ? -  Patient asymptomatic ? -5/17 no changes ? ?LOS: ?14 days ?A FACE TO FACE EVALUATION WAS PERFORMED ? ?Meredith Staggers ?09/21/2021, 9:12 AM  ? ?  ?

## 2021-09-21 NOTE — Progress Notes (Signed)
Speech Language Pathology Daily Session Note ? ?Patient Details  ?Name: Daniel Bailey. ?MRN: [id number] ?Date of Birth: 1951/04/28 ? ?Today's Date: 09/21/2021 ?SLP Individual Time: 1324-4010 ?SLP Individual Time Calculation (min): 30 min ? ?Short Term Goals: ?Week 2: SLP Short Term Goal 1 (Week 2): Patient will consume current diet with Min verbal cues for use of swallowing compensatory strategies. ?SLP Short Term Goal 2 (Week 2): Patient will self-monitor and correct wet vocal quality with a throat clear and re-swallow with Min verbal cues. ?SLP Short Term Goal 3 (Week 2): Patient will recall swallowing compensatory strategies with 75% accuracy with Min verbal cues. ? ?Skilled Therapeutic Interventions: Skilled ST services focused on swallow skills. Pt demonstrated recall of swallow strategies with supervision A verbal cues. SLP facilitated PO intake of nectar thick liquids and dys 1 texture snack, pt demonstrated mod I of small sips/bites with no overt s/s aspiration. However at the end of the snack, pt's voice appeared slightly wet and was unable to recall strategy. SLP cued pt for throat clear and re-swallow after x2 attempts pt demonstrated clear vocal quality. Pt was left in room with call bell within reach and bed alarm set. SLP recommends to continue skilled services. ? ?   ? ?Pain ?Pain Assessment ?Pain Score: 0-No pain ? ?Therapy/Group: Individual Therapy ? ?Amanie Mcculley ?09/21/2021, 1:27 PM ?

## 2021-09-21 NOTE — Progress Notes (Signed)
Occupational Therapy Weekly Progress Note ? ?Patient Details  ?Name: Daniel Bailey. ?MRN: [id number] ?Date of Birth: [birth year] ? ?Beginning of progress report period: Sep 15, 2021 ?End of progress report period: Sep 21, 2021 ? ?Today's Date: 09/21/2021 ?OT Individual Time: 1030-1130 ?OT Individual Time Calculation (min): 60 min  ? ? ?Pt has made good progress towards OT goals and is near goal level. STGs set to LTG d/t waiting for SNF placement. Pt is currently supervision with cuing for RW management/proximity to walker, supervision for ADLs including ted donning, however intermittently requiring A for thoroughness for posterior hygiene.  ? ?Patient continues to demonstrate the following deficits: muscle weakness, decreased cardiorespiratoy endurance, impaired timing and sequencing and decreased coordination, decreased attention, decreased awareness, decreased problem solving, decreased safety awareness, decreased memory, and delayed processing, and decreased sitting balance, decreased standing balance, decreased postural control, and decreased balance strategies and therefore will continue to benefit from skilled OT intervention to enhance overall performance with BADL and iADL. ? ?Patient progressing toward long term goals..  Continue plan of care. ? ?OT Short Term Goals ?Week 2:  OT Short Term Goal 1 (Week 2): STG=LTG d/t ELOS ?OT Short Term Goal 1 - Progress (Week 2): Progressing toward goal ?Week 3:  OT Short Term Goal 1 (Week 3): STG=LTG d/t ELOS ? ?Skilled Therapeutic Interventions/Progress Updates:  ?  Pt received in bed agreeable to OT reportng dizziness earlier with PT. Pt provided with 2 opportunities to drink netar thick liquids of choice. Pt only requires 2 cues for swallowing strategies during mild points of verbosity while liquids still in mouth. Pt changes clothing with sueprvision and cuing for adaptive strategies and pilling shirt past elbow. STS at recliner with no AD and supervision with no cues  needed for bodymechnaics. Exited session with pt seated in recliner, exit alarm on and call light in reach ? ? ?Therapy Documentation ?Precautions:  ?Precautions ?Precautions: Fall ?Restrictions ?Weight Bearing Restrictions: No ?General: ?  ? ? ? ?Therapy/Group: Individual Therapy ? ?[name] ?09/21/2021, 11:11 AM  ?

## 2021-09-21 NOTE — Progress Notes (Signed)
Physical Therapy Session Note ? ?Patient Details  ?Name: Daniel Bailey. ?MRN: [id number] ?Date of Birth: Sep 11, 1950 ? ?Today's Date: 09/21/2021 ?PT Individual Time: 4742-5956 ?PT Individual Time Calculation (min): 56 min  ? ?Short Term Goals: ?Week 2:  PT Short Term Goal 1 (Week 2): STG=LTG due to patient pending SNF placement. ? ?Skilled Therapeutic Interventions/Progress Updates:  ?   ?Pt received seated in recliner and agrees to therapy. No complaint of pain. Stand step transfer to [facility] with cues for sequencing and positioning, with CGA. WC transport to gym for time management. Pt re-educated on 6 minute walk test and is agreeable to perform. Test initiated and pt "stubs" toe on raised edge of parallel bars platform several steps in, requiring modA to maintain balance. Pt verbalizes pain in R big toe and requests rest break. PT visually inspect toe and no external damage noted. On 2nd attempt, pt is able to complete test, ambulating 396' in 3:50 seconds, prior to requiring seated rest break. PT provides cues for upright gaze to improve posture and balance, and cues for navigation. Following extended seated rest break, pt participates in dance therapy to address deficits in balance and endurance. Pt stands and performs coordinated arm and leg movements in rhythm with PT providing CGA, stepping forward and backward reciprocally. Pt requires rest break after 1:15 seconds. Following rest break, pt performs for 3:00 with lateral stepping, bilateral upper extremity raises, minisquats, and trunk rotation. WC transport back to room. Stand step back to recliner with CGA. Left seated with alarm intact and all needs within reach. ? ?Therapy Documentation ?Precautions:  ?Precautions ?Precautions: Fall ?Restrictions ?Weight Bearing Restrictions: No ? ? ? ?Therapy/Group: Individual Therapy ? ?Breck Coons, PT, DPT ?09/21/2021, 5:53 PM  ?

## 2021-09-21 NOTE — Progress Notes (Signed)
Physical Therapy Session Note ? ?Patient Details  ?Name: Daniel Bailey. ?MRN: [id number] ?Date of Birth: 07-08-1950 ? ?Today's Date: 09/21/2021 ?PT Individual Time: 0932-1000 ?PT Individual Time Calculation (min): 28 min  ? ?Short Term Goals: ?Week 2:  PT Short Term Goal 1 (Week 2): STG=LTG due to patient pending SNF placement. ? ?Skilled Therapeutic Interventions/Progress Updates:  ?   ?Patient in bed handed off from [place], [place], upon PT arrival. Patient alert and agreeable to PT session. Patient denied pain during session. ? ?Patient asymptomatic at beginning of session and with warm-up in standing prior to gait training. Patient with sudden onset of dizziness/light headedness ~100 feet into gait training, requiring emergency sit to mat table 10 ft away. Patient requested to lie down due to severity of symptoms, required several minutes for symptoms to resolve in lying.  ?Orthostatic Vitals: ?Supine: BP 116/52, HR 62  ?Sitting: BP 106/61, HR 63 ?Standing: BP 114/61, HR 66 (unable to remain standing due to mild symptoms with fatigue) ? ?LPN made aware of episode above and vitals. Encouraged increased fluid intake during the day and having patient up in the recliner with legs elevated in the morning to reduce symptoms with ambulation and upright mobility.  ? ?Therapeutic Activity: ?Bed Mobility: Patient performed supine to sit with mod I for increased time from a flat bed and min A supine to/from sit from mat table due to dizziness. Patient doffed/donned non-skid socks and donned TED hose with set-up assist. ?Transfers: Patient performed sit to/from stand and stand pivot with supervision-CGA for steadying support with dizziness. Provided verbal cues for forward weight shift. ? ?Gait Training:  ?Patient ambulated 110 feet without an AD with CGA, progressed to min A with R LOB with sudden onset of dizziness. Ambulated with decreased gait speed, decreased step length and height R>L, increased BOS, increased B toe out,  forward trunk lean, and downward head gaze. Provided verbal cues for erect posture, looking ahead, increased gait speed and arm swing for improved balance, and increased R foot clearance with fatigue. ? ?Patient in recliner with B lower extremities elevated, denied symptoms, at end of session with breaks locked, seat belt alarm set, and all needs within reach.  ? ?Therapy Documentation ?Precautions:  ?Precautions ?Precautions: Fall ?Restrictions ?Weight Bearing Restrictions: No ? ? ? ?Therapy/Group: Individual Therapy ? ?Doreene Burke PT, DPT ? ?09/21/2021, 12:29 PM  ?

## 2021-09-21 NOTE — Progress Notes (Signed)
Patient ID: Daniel Bailey., male   DOB: [birth year], 71 y.o.   MRN: [id number] ? ?Waiting for state to assess pt for SNF appropriateness. ? ?Loralee Pacas, MSW, LCSWA ?Office: [phone] ?Cell: [phone] ?Fax: [phone]  ?

## 2021-09-21 NOTE — Progress Notes (Signed)
Speech Language Pathology Daily Session Note ? ?Patient Details  ?Name: Daniel Bailey. ?MRN: [id number] ?Date of Birth: 1950-11-23 ? ?Today's Date: 09/21/2021 ?SLP Individual Time: 8101-7510 ?SLP Individual Time Calculation (min): 30 min ? ?Short Term Goals: ?Week 2: SLP Short Term Goal 1 (Week 2): Patient will consume current diet with Min verbal cues for use of swallowing compensatory strategies. ?SLP Short Term Goal 2 (Week 2): Patient will self-monitor and correct wet vocal quality with a throat clear and re-swallow with Min verbal cues. ?SLP Short Term Goal 3 (Week 2): Patient will recall swallowing compensatory strategies with 75% accuracy with Min verbal cues. ? ?Skilled Therapeutic Interventions: Skilled treatment session focused on dysphagia goals. Upon arrival, patient was awake in bed and consuming his breakfast meal of Dys. 1 textures with nectar-thick liquids. SLP facilitated session by providing supervision level verbal cues for use of small bites/sips and a slow rate of self-feeding. Mod verbal cues were needed for use of an intermittent throat clear and re-swallow as patient demonstrated an intermittent wet vocal quality, suspect due to pharyngeal residuals. Patient with intermittent coughing X 3, suspect due to increased verbosity today resulting in decreased use of multiple swallows. Recommend patient continue current diet with full supervision. Patient handed off to nursing. Continue with current plan of care.  ? ?Of note, patient's guardian [name]) called SLP with questions regarding dehydration. SLP assured [name] that thickened liquids will provide hydration as long as the patient drinks a sufficient amount. Suspect decreased intake of liquids is due to patient requiring full supervision with all intake. Therefore, discussed with both therapy and nursing the importance of offering additional liquids throughout the day outside of meals. All verbalized understanding.  ?   ? ?Pain ?No/Denies Pain   ? ?Therapy/Group: Individual Therapy ? ?Diannia Hogenson ?09/21/2021, 1:15 PM ?

## 2021-09-21 NOTE — Progress Notes (Signed)
Patient ID: Daniel Lafontaine Jr., male   DOB: 08/21/1950, 70 y.o.   MRN: [id number]    Progress Note from the Palliative Medicine Team at [facility] [place]   Patient Name: Daniel Cail Jr.        Date: 09/21/2021 DOB: 02/19/1951  Age: 70 y.o. MRN#: [id number] Attending Physician: Swartz, Zachary T, MD Primary Care Physician: Tower, Marne A, MD Admit Date: 09/07/2021   Medical records reviewed   70 y.o. male   admitted on 09/02/2021 with past medical history  intellectual disability, sinus bradycardia, prior GIB, prior hospitalization for aspiration PNA, OSA (not yet fitted for CPAP), aortic atherosclerosis, hypertension, anemia, hyperlipidemia and diabetes. Presented 4/28 with LLL PNA suspected due to aspiration and and superimposed encephalopathy on baseline intellectual disability. Cardiology consulted  for bradycardia.    Patient currently in  rehab/CIR--physical, occupational therapy and speech therapy ongoing.  Patient is out of bed to chair, occupational therapy is working with him .  [name] is always pleasant   Palliative medicine has met several times with patient's guardian Daniel Bailey for continued conversation regarding goals of care.    Plan of care clearly delineated and documented, MOST form completed -DNR/DNI -No artificial feeding or hydration now or in the future -Comfort feeds with known risk of aspiration, diet as recommended by speech with aspiration precautions -Complete stay in rehab hoping to gain further strength and better swallow  control -SNF for long-term placement  Patient is out of bed in the chair, currently working with occupational therapy.  Discussed with treatment team;  Dr Swartz and Daniel Chamberlain LCSW   PMT will sign off at this time please reconsult if needs arise before disposition     NP  Palliative Medicine Team Team Phone # [phone] Pager [phone]   

## 2021-09-22 LAB — BASIC METABOLIC PANEL
Anion gap: 7 (ref 5–15)
BUN: 32 mg/dL — ABNORMAL HIGH (ref 8–23)
CO2: 27 mmol/L (ref 22–32)
Calcium: 9.2 mg/dL (ref 8.9–10.3)
Chloride: 104 mmol/L (ref 98–111)
Creatinine, Ser: 1.33 mg/dL — ABNORMAL HIGH (ref 0.61–1.24)
GFR, Estimated: 57 mL/min — ABNORMAL LOW (ref >60)
Glucose, Bld: 86 mg/dL (ref 70–99)
Potassium: 4.6 mmol/L (ref 3.5–5.1)
Sodium: 138 mmol/L (ref 135–145)

## 2021-09-22 LAB — GLUCOSE, CAPILLARY: Glucose-Capillary: 84 mg/dL (ref 70–99)

## 2021-09-22 NOTE — Progress Notes (Signed)
Speech Language Pathology Daily Session Note  Patient Details  Name: Adoni Greenough. MRN: [id number] Date of Birth: 05/08/1951  Today's Date: 09/22/2021 SLP Individual Time: 1030-1055 SLP Individual Time Calculation (min): 25 min  Short Term Goals: Week 2: SLP Short Term Goal 1 (Week 2): Patient will consume current diet with Min verbal cues for use of swallowing compensatory strategies. SLP Short Term Goal 2 (Week 2): Patient will self-monitor and correct wet vocal quality with a throat clear and re-swallow with Min verbal cues. SLP Short Term Goal 3 (Week 2): Patient will recall swallowing compensatory strategies with 75% accuracy with Min verbal cues.  Skilled Therapeutic Interventions: Skilled treatment session focused on dysphagia goals. SLP facilitated session by providing skilled observation with nectar-thick liquids. Patient required supervision level question cues to recall swallowing compensatory strategies as well as to implement them during PO intake. Patient without overt s/s of aspiration. Recommend patient continue current diet with full supervision. Patient left upright in th wheelchair with alarm on and all needs within reach. Continue with current plan of care.      Pain No/Denies Pain   Therapy/Group: Individual Therapy  Bevan Vu, [place] 09/22/2021, 3:23 PM

## 2021-09-22 NOTE — Progress Notes (Signed)
Occupational Therapy Session Note  Patient Details  Name: Daniel Bailey. MRN: [id number] Date of Birth: 1950/07/11  Today's Date: 09/22/2021 OT Individual Time: 8756-4332 OT Individual Time Calculation (min): 58 min   Short Term Goals: Week 1:  OT Short Term Goal 1 (Week 1): Pt will ambulate with min A to obtain clothing items with RW OT Short Term Goal 1 - Progress (Week 1): Met OT Short Term Goal 2 (Week 1): Pt will be able to complete LB clothing with min A including socks and shoes OT Short Term Goal 2 - Progress (Week 1): Met OT Short Term Goal 3 (Week 1): Pt will demonstrate improved functional coordination via edema control to manage ADL fasteners with set up OT Short Term Goal 3 - Progress (Week 1): Met OT Short Term Goal 4 (Week 1): Pt will demonstrate safe standing balance for 2 grooming tasks sink side with contact guard A OT Short Term Goal 4 - Progress (Week 1): Met  Skilled Therapeutic Interventions/Progress Updates:    Pt greeted sitting upright in bed eating breakfast with nurse tech providing supervision. Handoff to OT. Pt able to self-feed meal and open containers with supervision. Pt needed min cues to recall swallowing strategies posted by SLP. He only had one coughing episode and was able to clear his cough. OT had pt name food items across his tray without assistance. Pt then reported need to go to the bathroom. Pt completed bed mobility with increased time, but supervision. Pt reported stiffness in his back. Pt then stood from EOB w/ RW and min A with cues for anterior weight shift. CGA for ambulation into bathroom w/ RW. Pt unable to void or have BM. Pt ambulated back out of bathroom and tolerated standing at the sink for 8 minutes while washing lower body, CGA. Pt able to thread pant legs and stand with CGA to pull pants up over hips. Set-up A for UB bathing/dressing. Pt left seated in wc at end of session with alarm belt on, call bell in reach and needs met.   Therapy  Documentation Precautions:  Precautions Precautions: Fall Restrictions Weight Bearing Restrictions: No Pain:  No number given, but pt reported back pain. Rest and repositioned for comfort.    Therapy/Group: Individual Therapy  Valma Cava 09/22/2021, 8:35 AM

## 2021-09-22 NOTE — Progress Notes (Signed)
PROGRESS NOTE   Subjective/Complaints: No new issues. Comfortable.   Review of Systems  Constitutional: Negative.  Negative for chills, fever and malaise/fatigue.  HENT: Negative.    Respiratory:  Negative for cough and shortness of breath.   Cardiovascular: Negative.  Negative for chest pain.  Gastrointestinal: Negative.  Negative for nausea.  Genitourinary: Negative.   Musculoskeletal:  Negative for myalgias.  Skin: Negative.   Neurological: Negative.  Negative for dizziness.  Psychiatric/Behavioral: Negative.  Negative for depression.      Objective:   No results found. Recent Labs    09/20/21 0528  WBC 3.7*  HGB 9.5*  HCT 29.4*  PLT 312    Recent Labs    09/20/21 0528 09/22/21 0508  NA 138 138  K 4.5 4.6  CL 102 104  CO2 29 27  GLUCOSE 77 86  BUN 35* 32*  CREATININE 1.29* 1.33*  CALCIUM 9.3 9.2     Intake/Output Summary (Last 24 hours) at 09/22/2021 0907 Last data filed at 09/21/2021 1804 Gross per 24 hour  Intake 300 ml  Output --  Net 300 ml         Physical Exam: Vital Signs Blood pressure 128/67, pulse (!) 52, temperature (!) 97.4 F (36.3 C), resp. rate 18, height '5\' 6"'$  (1.676 m), weight 88.4 kg, SpO2 100 %.  Constitutional: No distress . Vital signs reviewed. HEENT: NCAT, EOMI, oral membranes moist Neck: supple Cardiovascular: RRR without murmur. No JVD    Respiratory/Chest: CTA Bilaterally without wheezes or rales. Normal effort    GI/Abdomen: BS +, non-tender, non-distended Ext: no clubbing, cyanosis, or edema Psych: pleasant and cooperative  Neuro:   Patient is alert and oriented to self. Fairly attentive. Intact basic insight. moves all 4 extremities without limitations. Speech clear and likely at baseline Skin: is warm and dry Musculoskeletal: Full ROM, No pain with AROM or PROM in the neck, trunk, or extremities. Posture appropriate    Assessment/Plan: 1. Functional  deficits which require 3+ hours per day of interdisciplinary therapy in a comprehensive inpatient rehab setting. Physiatrist is providing close team supervision and 24 hour management of active medical problems listed below. Physiatrist and rehab team continue to assess barriers to discharge/monitor patient progress toward functional and medical goals  Care Tool:  Bathing    Body parts bathed by patient: Right arm, Left arm, Chest, Abdomen, Front perineal area, Buttocks, Right upper leg, Left upper leg, Right lower leg, Left lower leg, Face   Body parts bathed by helper: Right arm     Bathing assist Assist Level: Supervision/Verbal cueing     Upper Body Dressing/Undressing Upper body dressing   What is the patient wearing?: Pull over shirt    Upper body assist Assist Level: Supervision/Verbal cueing    Lower Body Dressing/Undressing Lower body dressing      What is the patient wearing?: Pants     Lower body assist Assist for lower body dressing: Supervision/Verbal cueing     Toileting Toileting    Toileting assist Assist for toileting: Minimal Assistance - Patient > 75%     Transfers Chair/bed transfer  Transfers assist     Chair/bed transfer assist level: Minimal Assistance -  Patient > 75%     Locomotion Ambulation   Ambulation assist      Assist level: Moderate Assistance - Patient 50 - 74% Assistive device: Walker-rolling Max distance: 15f   Walk 10 feet activity   Assist     Assist level: Moderate Assistance - Patient - 50 - 74% Assistive device: Hand held assist, Other (comment), Walker-rolling   Walk 50 feet activity   Assist Walk 50 feet with 2 turns activity did not occur: Safety/medical concerns         Walk 150 feet activity   Assist Walk 150 feet activity did not occur: Safety/medical concerns         Walk 10 feet on uneven surface  activity   Assist Walk 10 feet on uneven surfaces activity did not occur:  Safety/medical concerns         Wheelchair     Assist Is the patient using a wheelchair?: Yes Type of Wheelchair: Manual    Wheelchair assist level: Dependent - Patient 0%      Wheelchair 50 feet with 2 turns activity    Assist        Assist Level: Dependent - Patient 0%   Wheelchair 150 feet activity     Assist      Assist Level: Dependent - Patient 0%   Blood pressure 128/67, pulse (!) 52, temperature (!) 97.4 F (36.3 C), resp. rate 18, height '5\' 6"'$  (1.676 m), weight 88.4 kg, SpO2 100 %. Medical Problem List and Plan: 1. Functional deficits secondary to acute encephalopathy with baseline intellectual disability  -DNR             -patient may shower             -ELOS/Goals: SNF now pending         -Continue CIR therapies including PT, OT, and SLP   -Appreciate Palliative Care assist!    2.  Antithrombotics: -DVT/anticoagulation:  Pharmaceutical: Lovenox             -antiplatelet therapy: N/A 3. Pain Management: Tylenol as needed 4. Mood: Provide emotional support             -antipsychotic agents: N/A 5. Neuropsych: This patient is not capable of making decisions on his own behalf. 6. Skin/Wound Care: Routine skin  checks 7. Fluids/Electrolytes/Nutrition:   -appetite seems reasonable so far 8.  Recurrent aspiration pneumonia.  Antibiotic therapy completed 09/06/2021.  Currently on dysphagia #1 nectar thick liquid diet.  Follow-up speech therapy  -has occ cough when eating but is easily able to clear.   - sitting up at 90 deg for meals  -family has been made aware of aspiration precautions--palliative care f/u.   -seems to be coping, managing 9.  Hypotension.  ProAmatine 5 mg 3 times daily.  Monitor with increased mobility.      -5/17 bp controlled 10.  Diabetes mellitus.  Latest hemoglobin A1c 6.4.  SSI.  Patient on Glucophage 1000 mg daily prior to admission.  Resume as needed.  -CBG (last 3)  Recent Labs    09/20/21 2119 09/21/21 0546  09/22/21 0539  GLUCAP 131* 77 84    5/18 have changed cbgs to am only 11.  Normocytic anemia.  Patient with hematology work-up in the past.  Follow-up CBC  -had one + test in March   -5/16 hgb up to 9.5 but likely concentrated 12.  Hyperlipidemia.  Patient on Lopid/Lipitor prior to admission.  Currently on hold due to dysphagia 13.  Recurrent hypothermia with unclear cause             -asymptomatic, no associated sx  -not sure that these temp readings are accurate  -no further action indicated at this time 14. AKI on CKD stage IIIa. improved continue to monitor  -Cr stable to sl elevated  -5/18 BUN a little better at 32  -continue 1/2ns at HS  -pushing fluids  -pt needs cueing to drink. Can do fine if cued 15. GERD. Continue PPI 16. Bradycardia. Seen by cardiology. Continue to monitor  -  Patient asymptomatic  -5/18 no changes  LOS: 15 days A FACE TO [place] 09/22/2021, 9:07 AM

## 2021-09-22 NOTE — Progress Notes (Signed)
Physical Therapy Weekly Progress Note  Patient Details  Name: Daniel Bailey. MRN: [id number] Date of Birth: 1950/09/14  Beginning of progress report period: Sep 15, 2021 End of progress report period: Sep 22, 2021  Today's Date: 09/22/2021 PT Individual Time: 2440-1027 and 1510-1545 PT Individual Time Calculation (min): 60 min and 35 min   Patient progressing well towards long term goals. Patient currently awaiting SNF placement due to need for 24/7 supervision with mobility and swallowing reaching a plateau in progress this week for both. Patient reduced to QD for therapies due to limited slowed progress. Patient currently performs bed mobility and transfers with supervision, intermittent CGA in standing with dizziness, and gait >300 ft without an AD with CGA-close supervision. Patient with intermittent limitations in mobility due to dizziness/light headedness secondary to intermittent orthostasis. Providing thickened liquids during rest breaks in sessions to promote fluid intake. Patient's family has been educated on the patient's current functional status and reports that they will not be able to provide this level of care at d/c.   Patient continues to demonstrate the following deficits muscle weakness, decreased cardiorespiratoy endurance, decreased problem solving and decreased memory, and decreased standing balance, decreased postural control, and decreased balance strategies and therefore will continue to benefit from skilled PT intervention to increase functional independence with mobility.  Patient progressing toward long term goals..  Plan of care revisions: d/c to SNF due to lack of caregiver support.  PT Short Term Goals Week 2:  PT Short Term Goal 1 (Week 2): STG=LTG due to patient pending SNF placement. PT Short Term Goal 1 - Progress (Week 2): Progressing toward goal Week 3:  PT Short Term Goal 1 (Week 3): STG=LTG due to patient pending SNF placement.  Skilled Therapeutic  Interventions/Progress Updates:     Session 1: Patient in w/c in the room upon PT arrival. Patient alert and agreeable to PT session. Patient denied pain during session, reports increased fatigue today.   Orthostatic Vitals: Sitting: BP 103/63, HR 48 Standing: BP 122/68, HR 64 Standing x3 min: BP 124/77, HR 68 (reported "a lot of dizziness," "felt like I'm gonna pass out.") notable paler face and instability in standing  Orthostatic Vitals: Supine x5 min: BP 105/59, HR 52, SPO2 100%  Sitting: BP 108/59, HR 55, SPO2: 100% Standing: BP 122/76, HR 66 Standing x3 min: BP 124/80, HR 67 (asymptomatic)  LPN and MD made aware. MD recommended increased fluid intake. Provided patient with nectar thick diet cola and encouraged patient to ask for fluids throughout the day, but to limit sodas. Patient in agreement. Patient without signs of aspiration and mod cues for swallowing strategies with full supervision while drinking his cola.  Therapeutic Activity: Bed Mobility: Patient performed supine to/from sit with mod I for increased time in a flat bed without use of bed rails.  Transfers: Patient performed sit to/from stand x2 and stand pivot w/c>bed with supervision-CGA due to symptoms in standing. Provided verbal cues for forward weight shift .  Patient ambulated 100 feet using the RW, self selected by patient, with CGA and cues for erect posture, looking ahead, safe proximity to RW, and increased step height and length.  Patient in w/c in the room at end of session with breaks locked, seat belt alarm set, and all needs within reach.   Session 2: Patient in w/c with family present upon PT arrival. Patient alert and agreeable to PT session. Patient denied pain during session.  X2 bouts of light headedness in standing and while  ambulating, as above, vitals WNL. Second bout with mild SOB. Provided patient with nectar thick cranberry juice during session with cues as above for swallowing.    Therapeutic Activity: Bed Mobility: Patient performed sit to supine with mod I for increased time.  Transfers: Patient performed sit to/from stand x3 and toilet transfer x1 with CGA without AD and min A from toilet using grab bar. Provided verbal cues for forward weight shift and use of grab bar for safety due to lower surface height. Patient with light headedness on first stand and appropriately self selected to return to sitting and wait a minute before trying again without symptoms. Patient continent of bowl and bladder during toileting. Initiated peri-care independently, required total A for thoroughness due to loose BM, and mod A for lower body clothing management.   Gait Training:  Patient ambulated >100 feet and >50 feet without an AD with CGA for steadying support. Ambulated as above. Patient with x1 LOB requiring min-mod A with sudden onset of feeling light headed and patient requested to sit, NT brought patient's w/c up for safety.   LPN made aware of patient's episodes of light headedness x2 and fluids provided.   Patient in bed with family in the room at end of session with breaks locked, bed alarm set, and all needs within reach.   Therapy Documentation Precautions:  Precautions Precautions: Fall Restrictions Weight Bearing Restrictions: No   Therapy/Group: Individual Therapy  Carrina Schoenberger L Allex Madia PT, DPT  09/22/2021, 3:54 PM

## 2021-09-23 LAB — GLUCOSE, CAPILLARY
Glucose-Capillary: 107 mg/dL — ABNORMAL HIGH (ref 70–99)
Glucose-Capillary: 86 mg/dL (ref 70–99)

## 2021-09-23 MED ORDER — FERROUS SULFATE 325 (65 FE) MG PO TABS: 325.0000 mg | ORAL_TABLET | Freq: Every day | ORAL | 3 refills | Status: DC

## 2021-09-23 NOTE — Progress Notes (Signed)
PROGRESS NOTE   Subjective/Complaints: Pt up in room. No new issues..   Review of Systems  Constitutional: Negative.  Negative for chills, fever and malaise/fatigue.  HENT: Negative.    Respiratory:  Negative for cough and shortness of breath.   Cardiovascular: Negative.  Negative for chest pain.  Gastrointestinal: Negative.  Negative for nausea.  Genitourinary: Negative.   Musculoskeletal:  Negative for myalgias.  Skin: Negative.   Neurological: Negative.  Negative for dizziness.  Psychiatric/Behavioral: Negative.  Negative for depression.      Objective:   No results found. No results for input(s): WBC, HGB, HCT, PLT in the last 72 hours.  Recent Labs    09/22/21 0508  NA 138  K 4.6  CL 104  CO2 27  GLUCOSE 86  BUN 32*  CREATININE 1.33*  CALCIUM 9.2     Intake/Output Summary (Last 24 hours) at 09/23/2021 1035 Last data filed at 09/23/2021 0900 Gross per 24 hour  Intake 840 ml  Output --  Net 840 ml         Physical Exam: Vital Signs Blood pressure (!) 103/52, pulse 60, temperature 97.7 F (36.5 C), resp. rate 18, height '5\' 6"'$  (1.676 m), weight 86.7 kg, SpO2 98 %.  Constitutional: No distress . Vital signs reviewed. HEENT: NCAT, EOMI, oral membranes moist Neck: supple Cardiovascular: RRR without murmur. No JVD    Respiratory/Chest: CTA Bilaterally without wheezes or rales. Normal effort    GI/Abdomen: BS +, non-tender, non-distended Ext: no clubbing, cyanosis, or edema Psych: pleasant and cooperative  Neuro:   Patient is alert and oriented to self. Fairly attentive. Intact basic insight. moves all 4 extremities without limitations. Cognitively improving. Speech clear Skin: is warm and dry Musculoskeletal:normal rom, No pain with AROM or PROM in the neck, trunk, or extremities. Posture appropriate    Assessment/Plan: 1. Functional deficits which require 3+ hours per day of interdisciplinary  therapy in a comprehensive inpatient rehab setting. Physiatrist is providing close team supervision and 24 hour management of active medical problems listed below. Physiatrist and rehab team continue to assess barriers to discharge/monitor patient progress toward functional and medical goals  Care Tool:  Bathing    Body parts bathed by patient: Right arm, Left arm, Chest, Abdomen, Front perineal area, Buttocks, Right upper leg, Left upper leg, Right lower leg, Left lower leg, Face   Body parts bathed by helper: Right arm     Bathing assist Assist Level: Supervision/Verbal cueing     Upper Body Dressing/Undressing Upper body dressing   What is the patient wearing?: Pull over shirt    Upper body assist Assist Level: Supervision/Verbal cueing    Lower Body Dressing/Undressing Lower body dressing      What is the patient wearing?: Pants     Lower body assist Assist for lower body dressing: Supervision/Verbal cueing     Toileting Toileting    Toileting assist Assist for toileting: Minimal Assistance - Patient > 75%     Transfers Chair/bed transfer  Transfers assist     Chair/bed transfer assist level: Minimal Assistance - Patient > 75%     Locomotion Ambulation   Ambulation assist  Assist level: Contact Guard/Touching assist Assistive device: No Device Max distance: 150   Walk 10 feet activity   Assist     Assist level: Contact Guard/Touching assist Assistive device: No Device   Walk 50 feet activity   Assist Walk 50 feet with 2 turns activity did not occur: Safety/medical concerns  Assist level: Contact Guard/Touching assist Assistive device: No Device    Walk 150 feet activity   Assist Walk 150 feet activity did not occur: Safety/medical concerns  Assist level: Contact Guard/Touching assist Assistive device: No Device    Walk 10 feet on uneven surface  activity   Assist Walk 10 feet on uneven surfaces activity did not occur:  Safety/medical concerns   Assist level: Contact Guard/Touching assist Assistive device: Other (comment) (no device)   Wheelchair     Assist Is the patient using a wheelchair?: Yes Type of Wheelchair: Manual    Wheelchair assist level: Dependent - Patient 0%      Wheelchair 50 feet with 2 turns activity    Assist        Assist Level: Dependent - Patient 0%   Wheelchair 150 feet activity     Assist      Assist Level: Dependent - Patient 0%   Blood pressure (!) 103/52, pulse 60, temperature 97.7 F (36.5 C), resp. rate 18, height '5\' 6"'$  (1.676 m), weight 86.7 kg, SpO2 98 %. Medical Problem List and Plan: 1. Functional deficits secondary to acute encephalopathy with baseline intellectual disability  -DNR             -patient may shower             -ELOS/Goals: to SNF potentially  5/22         -Continue CIR therapies including PT, OT, and SLP    -Appreciate Palliative Care assist!    2.  Antithrombotics: -DVT/anticoagulation:  Pharmaceutical: Lovenox             -antiplatelet therapy: N/A 3. Pain Management: Tylenol as needed 4. Mood: Provide emotional support             -antipsychotic agents: N/A 5. Neuropsych: This patient is not capable of making decisions on his own behalf. 6. Skin/Wound Care: Routine skin  checks 7. Fluids/Electrolytes/Nutrition:   -appetite seems reasonable so far 8.  Recurrent aspiration pneumonia.  Antibiotic therapy completed 09/06/2021.  Currently on dysphagia #1 nectar thick liquid diet.  Follow-up speech therapy  -has occ cough when eating but is easily able to clear.   - sitting up at 90 deg for meals  -family has been made aware of aspiration precautions--palliative care f/u.   -seems to be coping, managing better 9.  Hypotension.  ProAmatine 5 mg 3 times daily.  Monitor with increased mobility.      -5/17 bp controlled 10.  Diabetes mellitus.  Latest hemoglobin A1c 6.4.  SSI.  Patient on Glucophage 1000 mg daily prior to  admission.  Resume as needed.  -CBG (last 3)  Recent Labs    09/22/21 0539 09/23/21 0538 09/23/21 0608  GLUCAP 84 86 107*    5/19 have changed cbgs to am only 11.  Normocytic anemia.  Patient with hematology work-up in the past.  Follow-up CBC  -had one + test in March   -5/16 hgb up to 9.5 but likely concentrated 12.  Hyperlipidemia.  Patient on Lopid/Lipitor prior to admission.  Currently on hold due to dysphagia 13. Recurrent hypothermia with unclear cause             -  asymptomatic, no associated sx  -not sure that these temp readings are accurate  -no further action indicated at this time 14. AKI on CKD stage IIIa.    -Cr stable to sl elevated  -5/18 BUN a little better at 32--recheck 5/22  -continue 1/2ns at HS thru tonight  -pushing po fluids  -pt needs cueing to drink. Can do fine if cued 15. GERD. Continue PPI 16. Bradycardia. Seen by cardiology. Continue to monitor  -  Patient asymptomatic  -5/19 no changes  LOS: 16 days A FACE TO [place] 09/23/2021, 10:35 AM

## 2021-09-23 NOTE — Progress Notes (Signed)
Occupational Therapy Session Note  Patient Details  Name: Daniel Bailey. MRN: [id number] Date of Birth: 1951-03-25  Today's Date: 09/23/2021 Session 1 OT Individual Time: 1003-1100 OT Individual Time Calculation (min): 57 min   Session 2 OT Individual Time: 1420-1503 OT Individual Time Calculation (min): 43 min    Short Term Goals: Week 2:  OT Short Term Goal 1 (Week 2): STG=LTG d/t ELOS OT Short Term Goal 1 - Progress (Week 2): Progressing toward goal  Skilled Therapeutic Interventions/Progress Updates:  Session 1   Pt greeted semi-reclined in bed and agreeable to OT treatment session. Pt reported he wanted to shower today. Pt needed mod A to get to standing from recliner after scooting to the edge due to posterior lean. Pt then ambulated to bathroom w/ RW and CGA. Pt sat on raised commode over toilet for extended time trying to have BM, but unsuccessful. Pt needed min A to doff clothing seated on toilet, then ambulated to shower with CGA. Pt completed bathing tasks with CGA for balance when standing to wash buttocks. Pt ambulated out of bathroom in similar fashion and sat EOB to get dressed. OT educated on friction reducing bag to don TED hose, but needed OT assist to complete. Pt could thread pant legs, then stood with CGA to pull up pants w/ RW. Pt needed intermittent rest breaks throughout BADL tasks. Pt returned to bed at end of session with min A and left semi-reclined in bed with bed alarm on, call bell in reach, and needs met.   Session 2 Pt greeted seated in recliner with friend present and agreeable to OT treatment session. Pt needed mod A to get to standing again from low recliner using RW. Pt reported he had been sitting for too long. Pt then ambulated to the ortho gym with RW and CGA, but had one LOB with slight L knee buckle requiring min A to correct. Addressed standing balance/endurance standing on foam block with min A. Had pt reach outside base of support to collect clothes  pins for fine motor coordination as well. Addressed balance, trunk rotation, and UE external rotation needed for BADL tasks such as toileting and clothing management with clothes pins attached to gait belt around pt, while standing on foam block. Min A for balance with increased task demand. Pt ambulated back to room w/ RW and CGA, then left seated in recliner with alarm belt on, call bell in reach and needs met.   Therapy Documentation Precautions:  Precautions Precautions: Fall Restrictions Weight Bearing Restrictions: No Pain:   Pt reported mild back pain, rest and repositioned for comfort.   Therapy/Group: Individual Therapy  Valma Cava 09/23/2021, 3:13 PM

## 2021-09-23 NOTE — Progress Notes (Signed)
Physical Therapy Session Note  Patient Details  Name: Daniel Bailey. MRN: [id number] Date of Birth: 1950/08/04  Today's Date: 09/23/2021 PT Individual Time: 0800-0900 PT Individual Time Calculation (min): 60 min   Short Term Goals: Week 2:  PT Short Term Goal 1 (Week 2): STG=LTG due to patient pending SNF placement. PT Short Term Goal 1 - Progress (Week 2): Progressing toward goal  Skilled Therapeutic Interventions/Progress Updates: Pt presents sitting in recliner and agreeable to therapy.  Pt states some dizziness.  BP taken in sitting 103/69 (MAP 80) and then requires min A to thread pants over feet and then transfers sit to stand w/ supervision and supervision for balance while pt pulls up pants.  BP taken in standing at 122/75 (MAP 89) w/ trembling of knees but no LOB.  Pt continues to c/o dizziness, nsg in to administer meds, aware of BP.  Pt returned to sitting w/ BP of 109/56 (MAP 77).  Pt transfers sit to stand w/ supervision and amb 150' w/o c/o, stating dizziness improved, BP of 122/68 (MAP 83) after gait.  Pt amb multiple trials w/o AD and CGA up to 150' w/ verbal cues for posture and foot clearance.  Pt continues to state improvement w/ dizziness.  Pt returned to recliner w/ chair alarm on and all needs in reach.  Nursing notified of pt performance and BP.     Therapy Documentation Precautions:  Precautions Precautions: Fall Restrictions Weight Bearing Restrictions: No General:   Vital Signs: Therapy Vitals BP: (!) 103/52 Patient Position (if appropriate): Lying Pain:0/10      Therapy/Group: Individual Therapy  Ladoris Gene 09/23/2021, 9:53 AM

## 2021-09-23 NOTE — Discharge Summary (Addendum)
Physician Discharge Summary  Patient ID: Daniel Bailey. MRN: [id number] DOB/AGE: 72-Mar-1952 72 y.o.  Admit date: 09/07/2021 Discharge date: 09/26/2021  Discharge Diagnoses:  Principal Problem:   Acute encephalopathy DVT prophylaxis Recurrent aspiration pneumonia Intellectual disability Normocytic anemia Hypotension Hyperlipidemia AKI on CKD stage III GERD Diabetes mellitus Asymptomatic bradycardia  Discharged Condition: Stable  Significant Diagnostic Studies: CT Head Wo Contrast  Result Date: 09/02/2021 CLINICAL DATA:  Mental status changes EXAM: CT HEAD WITHOUT CONTRAST TECHNIQUE: Contiguous axial images were obtained from the base of the skull through the vertex without intravenous contrast. RADIATION DOSE REDUCTION: This exam was performed according to the departmental dose-optimization program which includes automated exposure control, adjustment of the mA and/or kV according to patient size and/or use of iterative reconstruction technique. COMPARISON:  07/12/2021 FINDINGS: Brain: No evidence of acute infarction, hemorrhage, hydrocephalus, extra-axial collection or mass lesion/mass effect. Vascular: No hyperdense vessel or unexpected calcification. Skull: Normal. Negative for fracture or focal lesion. Sinuses/Orbits: Paranasal sinuses and mastoid air cells are clear. Other: None IMPRESSION: 1. No acute intracranial abnormalities. 2. Normal brain for age. Electronically Signed   By: Kerby Moors M.D.   On: 09/02/2021 15:35   MR BRAIN WO CONTRAST  Result Date: 09/02/2021 CLINICAL DATA:  Altered mental status EXAM: MRI HEAD WITHOUT CONTRAST TECHNIQUE: Multiplanar, multiecho pulse sequences of the brain and surrounding structures were obtained without intravenous contrast. COMPARISON:  No prior MRI, correlation is made with CT head 09/02/2021 FINDINGS: Evaluation is somewhat limited by motion artifact. Brain: No restricted diffusion to suggest acute or subacute infarct. No acute  hemorrhage, mass, mass effect, or midline shift. No hydrocephalus or extra-axial collection. No foci of hemosiderin deposition to suggest remote hemorrhage. Scattered T2 hyperintense signal in the periventricular white matter, likely the sequela of mild chronic small vessel ischemic disease. Vascular: Normal flow voids. Skull and upper cervical spine: Normal marrow signal. Sinuses/Orbits: Clear paranasal sinuses. The orbits are unremarkable. Other: None. IMPRESSION: No acute intracranial process. Electronically Signed   By: Merilyn Baba M.D.   On: 09/02/2021 19:30   US RENAL  Result Date: 09/04/2021 CLINICAL DATA:  71 year old male with acute kidney injury. EXAM: RENAL / URINARY TRACT ULTRASOUND COMPLETE COMPARISON:  07/12/2021 CT FINDINGS: Right Kidney: Renal measurements: 10.9 x 4.4 x 5 cm = volume: 125 mL. Slightly increased renal echogenicity is identified. There is no evidence of hydronephrosis, renal mass or definite renal calculi. Left Kidney: Renal measurements: 11.3 x 5 x 5 cm = volume: 154 mL. Slightly increased renal echogenicity noted. There is no evidence of hydronephrosis, renal mass or definite renal calculi. Bladder: Appears normal for degree of bladder distention. Other: None. IMPRESSION: 1. Slightly increased bilateral renal echogenicity, compatible with medical renal disease. No evidence of hydronephrosis or renal mass. Electronically Signed   By: Margarette Canada M.D.   On: 09/04/2021 14:02   DG Chest Port 1 View  Result Date: 09/14/2021 CLINICAL DATA:  Cough EXAM: PORTABLE CHEST 1 VIEW COMPARISON:  09/02/2021 FINDINGS: Heart size and pulmonary vascularity normal. Left lower lobe airspace disease unchanged. Mild progression of right lower lobe airspace disease which is mild. No pleural effusion. IMPRESSION: No change in left lower lobe airspace disease progression of mild right lower lobe airspace disease. Possible pneumonia or atelectasis. Electronically Signed   By: Franchot Gallo M.D.    On: 09/14/2021 08:02   DG Chest Port 1 View  Result Date: 09/02/2021 CLINICAL DATA:  Cough EXAM: PORTABLE CHEST 1 VIEW COMPARISON:  July 12, 2021 and CT  of the chest on the same day FINDINGS: Heart is borderline. As before, the heart size and mediastinal contours are within normal limits. Again seen is the retrocardiac infiltrate without significant interval change. Visualized skeletal structures are unremarkable. IMPRESSION: Retrocardiac infiltrate and was seen in the previous study as well. Electronically Signed   By: Frazier Richards M.D.   On: 09/02/2021 14:32   DG Swallowing Func-Speech Pathology   Deblois, Barbette Reichmann, CCC-SLP Speech and Language Pathologist Speech Pathology Progress Notes   Signed Date of Service:  09/06/2021  8:25 AM Signed   Modified Barium Swallow Progress Note   Patient Details Name: Daniel Bailey. MRN: [id number] Date of Birth: August 25, 1950   Today's Date: 09/06/2021   Modified Barium Swallow completed.  Full report located under Chart Review in the Imaging Section.   Brief recommendations include the following:   Clinical Impression             Pt demonstrates a moderate to severe oropharyngeal dysphagia with delayed oral transit decreased bolus cohesion, lingual and palatal residue as well as significantly decreased strength of mid/lower pharyngeal constrictors with vallecular and pyriform sinus residue with all textures that spill into the airway post swallow. Pt aspirates purees and solids as well. Pt only senses larger volume aspiration. Attempted to cue pt to clear throat and swallow again. With max cueing he was able to do this, but with low effort and weakness. Perhaps with training pt could improve, but aspiration risk will be high with all textures. Nectar thick and puree with sips of liquids after solids and cueing to clear throat and swallow again are ideal but perhaps not realistic. Will f/u for decision making with team.  Swallow Evaluation Recommendations        SLP Diet  Recommendations: Dysphagia 1 (Puree) solids;Nectar thick liquid    Liquid Administration via: Cup;Straw    Medication Administration: Crushed with puree    Supervision: Staff to assist with self feeding    Compensations: Slow rate;Small sips/bites;Follow solids with liquid;Effortful swallow;Clear throat intermittently    Postural Changes: Remain semi-upright after after feeds/meals (Comment)    Oral Care Recommendations: Oral care BID           Lynann Beaver 09/06/2021,8:25 AM   EEG adult  Result Date: 09/03/2021 [name], MD     09/03/2021  8:23 AM Patient Name: Daniel Bailey. MRN: [id number] Epilepsy Attending: [name] Referring Physician/Provider: Patrecia Pour, MD Date: 09/02/2021 Duration: 22.57 mins Patient history: 71yo M with ams. EEG to evaluate for seizure Level of alertness: lethargic AEDs during EEG study: None Technical aspects: This EEG study was done with scalp electrodes positioned according to the 10-20 International system of electrode placement. Electrical activity was acquired at a sampling rate of '500Hz'$  and reviewed with a high frequency filter of '70Hz'$  and a low frequency filter of '1Hz'$ . EEG data were recorded continuously and digitally stored. Description: No posterior dominant rhythm was seen. EEG showed continuous generalized 3 to 5 Hz theta-delta slowing. Hyperventilation and photic stimulation were not performed.   ABNORMALITY - Continuous slow, generalized IMPRESSION: This study is suggestive of moderate to severe diffuse encephalopathy, nonspecific etiology. No seizures or epileptiform discharges were seen throughout the recording. [name]    Labs:  Basic Metabolic Panel: Recent Labs  Lab 09/20/21 0528 09/22/21 0508 09/26/21 0632  NA 138 138 139  K 4.5 4.6 4.4  CL 102 104 105  CO2 '29 27 27  '$ GLUCOSE 77  86 85  BUN 35* 32* 35*  CREATININE 1.29* 1.33* 1.31*  CALCIUM 9.3 9.2 9.1    CBC: Recent Labs  Lab 09/20/21 0528 09/26/21 0632   WBC 3.7* 2.8*  HGB 9.5* 9.1*  HCT 29.4* 27.1*  MCV 96.4 95.1  PLT 312 232    CBG: Recent Labs  Lab 09/23/21 0608 09/24/21 0547 09/24/21 2036 09/25/21 0517 09/26/21 0607  GLUCAP 107* 83 120* 79 88   Family history.  Mother with colon cancer and kidney disease.  Denies any esophageal cancer or rectal cancer  Brief HPI:   Daniel Bailey. is a 71 y.o. right-handed male with history of intellectual disability sinus bradycardia bifascicular block hypotension maintained on ProAmatine, prior GIB, diabetes mellitus anemia with hematology work-up in the past hyperlipidemia as well as recent recurrent aspiration pneumonia.  Per chart review lives with cousin.  Patient able to assist with chores around the house prior to admission.  Presented 09/02/2021 with cough over the past few days without fever and reports of lethargy with some bizarre behavior.  CT/MRI of the brain showed no acute process.  Chest x-ray retrocardiac infiltrate as seen on prior comparisons.  Chemistries with WBC 13,400 hemoglobin 9.7 creatinine 1.48 blood cultures no growth to date lactic acid 0.8 ammonia level 18.  Renal ultrasound showed no hydronephrosis.  EEG negative for seizure but did show encephalopathy.  He completed a 5-day course of broad-spectrum antibiotics.  Patient's mental status continued to improve close to baseline.  Maintain on a dysphagia #1 nectar thick liquid diet.  Consideration was made for PEG tube.  Palliative care consulted to establish goals of care.  Maintained on Lovenox for DVT prophylaxis.  Therapy evaluations completed due to patient decreased functional mobility was admitted for a comprehensive rehab program.   Hospital Course: Daniel Bailey. was admitted to rehab 09/07/2021 for inpatient therapies to consist of PT, ST and OT at least three hours five days a week. Past admission physiatrist, therapy team and rehab RN have worked together to provide customized collaborative inpatient rehab.  Pertaining  to patient's acute encephalopathy with baseline intellectual disability.  Patient was attending therapies.  Palliative care was consulted to establish goals of care.  Lovenox for DVT prophylaxis no bleeding episodes.  Recurrent aspiration pneumonia completed antibiotic course remained afebrile.  Currently on dysphagia #1 nectar thick liquid with follow-up per speech therapy.  Family made aware of aspiration precautions.  Hypotension monitored he continued on ProAmatine.  Blood sugars controlled hemoglobin A1c 6.4.  His Glucophage was held due to renal insufficiency.  Normocytic anemia no bleeding episodes he had been seen by hematology in the past for work-up.  AKI on CKD stage III with latest creatinine 1.33.  Asymptomatic bradycardia with no chest pain or shortness of breath.   Blood pressures were monitored on TID basis and soft and monitored  Diabetes has been monitored with ac/hs CBG checks and SSI was use prn for tighter BS control.    Rehab course: During patient's stay in rehab weekly team conferences were held to monitor patient's progress, set goals and discuss barriers to discharge. At admission, patient required moderate assist 30 feet rolling walker moderate assist supine to sit  Physical exam.  Blood pressure 106/78 pulse 41 temperature 96.3 respirations 18 oxygen saturation 90% room air Constitutional.  No acute distress HEENT Head.  Normocephalic and atraumatic Eyes.  Pupils round and reactive to light no discharge without nystagmus Neck.  Supple nontender no JVD without thyromegaly Cardiac regular rate  rhythm without any extra sounds or murmur heard Abdomen.  Soft nontender positive bowel sounds without rebound Respiratory effort normal no respiratory distress without wheeze Musculoskeletal.  Tone was normal.  MMT unable to complete due to limited command following.  Moves all 4 extremities.   He/She  has had improvement in activity tolerance, balance, postural control as well  as ability to compensate for deficits. He/She has had improvement in functional use RUE/LUE  and RLE/LLE as well as improvement in awareness.  Supervision with mobility ambulates 300 feet without assistive device.  Working with energy conservation techniques.  Monitor closely for orthostasis.  Patient able to feed himself.  Needed minimal cues to recall swallowing strategies.  Contact-guard for ambulation into the bathroom.  Patient able to thread pant legs and stand contact-guard set up for upper body bathing and dressing.  Speech therapy continue to follow for dysphagia.  Patient was discharged to skilled nursing facility       Disposition: Discharge to skilled nursing facility    Diet: Dysphagia #1 nectar thick liquids  Special Instructions: No smoking or alcohol  Medications at discharge 1.  Tylenol as needed 2.  Ferrous sulfate 300.5 mg p.o. daily 3.  ProAmatine 5 mg p.o. 3 times daily with meals 4.  Protonix 40 mg p.o. daily  30-35 minutes were spent completing discharge summary and discharge planning     Contact information for follow-up providers     Meredith Staggers, MD Follow up.   Specialty: Physical Medicine and Rehabilitation Why: No formal follow up needed Contact information: [place] [address] [phone]         Skeet Latch, MD Follow up.   Specialty: Cardiology Why: as needed Contact information: [address] [place] [address] [phone]              Contact information for after-discharge care     Destination     [facility] .   Service: Skilled Nursing Contact information: [address] [place] [phone]                     Signed: Cathlyn Parsons 09/26/2021, 8:14 AM

## 2021-09-23 NOTE — Progress Notes (Signed)
Patient ID: Daniel Hayward., male   DOB: Sep 05, 1950, 71 y.o.   MRN: [id number]  SW received state approval for SNF. NCPASSR# [id number]   SW left message for Michelle/[facility] to discuss bed availability. Sw waiting on clearance from attending for d/c.  *SW received phone call from pt cousin/guardian Daniel Bailey requesting follow-up from MD as she has medical questions. SW informed concerns will be relayed. SW shared above with regard to SNF approval, and informed that he will not leave if he is not medically ready, and also have to confirm bed availability. She is aware this SW will follow-up as she will have to transport him to SNF.  SW received updates from Michelle/Admissions with [facility] confirming pt can be accepted on Monday.  *Attending approves for pt to d/c on Monday. SW called pt cousin/POA Daniel Bailey to discuss transportation to SNF. States that Ms. Fraser Din will p/u him up at 12pm to transport to facility. SW informed Michelle/[facility]missions with [facility] with updates and asked if she can reach out to [facility].   Loralee Pacas, MSW, [place] Office: [phone] Cell: [phone] Fax: [phone]

## 2021-09-23 NOTE — Progress Notes (Signed)
Speech Language Pathology Weekly Progress and Session Note  Patient Details  Name: Daniel Bailey. MRN: [id number] Date of Birth: 1950/09/11  Beginning of progress report period: Sep 16, 2021 End of progress report period: Sep 23, 2021  Today's Date: 09/23/2021 SLP Individual Time: 1300-1330 SLP Individual Time Calculation (min): 30 min  Short Term Goals: Week 2: SLP Short Term Goal 1 (Week 2): Patient will consume current diet with Min verbal cues for use of swallowing compensatory strategies. SLP Short Term Goal 1 - Progress (Week 2): Met SLP Short Term Goal 2 (Week 2): Patient will self-monitor and correct wet vocal quality with a throat clear and re-swallow with Min verbal cues. SLP Short Term Goal 2 - Progress (Week 2): Met SLP Short Term Goal 3 (Week 2): Patient will recall swallowing compensatory strategies with 75% accuracy with Min verbal cues.    New Short Term Goals: Week 3: SLP Short Term Goal 1 (Week 3): Patient will consume current diet with supervision verbal cues for use of swallowing compensatory strategies. SLP Short Term Goal 2 (Week 3): Patient will self-monitor and correct wet vocal quality with a throat clear and re-swallow with supervision verbal cues. SLP Short Term Goal 3 (Week 3): Patient will recall swallowing compensatory strategies with 75% accuracy with supervision verbal cues.  Weekly Progress Updates: Patient has made functional gains and has met 3 of 3 STGs this reporting period. Currently, patient is consuming Dys. 1 textures with nectar-thick liquids via cup with intermittent but decreased overt s/s of aspiration with Min A verbal cues needed for recall and utilization of swallowing compensatory strategies. Recommend patient continue on current diet and remain full supervision. Patient and family education ongoing. Patient would benefit from continued skilled SLP intervention to reduce his aspiration risk on his current diet  with increased use of swallowing  compensatory strategies prior to discharge.     Intensity: Minumum of 1-2 x/day, 30 to 90 minutes Frequency: 3 to 5 out of 7 days (QD) Duration/Length of Stay: TBD due to SNF placement Treatment/Interventions: Dysphagia/aspiration precaution training;Environmental controls;Cueing hierarchy;Functional tasks;Patient/family education;Therapeutic Activities   Daily Session  Skilled Therapeutic Interventions:   Skilled treatment session focused on dysphagia goals. Upon arrival, patient was supine in bed but agreeable to transfer to the recliner. SLP facilitated session by providing skilled observation with nectar-thick liquids. Patient consumed liquids with overt cough X 1, suspect due to talking with a full oral cavity and prior to swallow initiation. Supervision verbal cues were needed to self-monitor and correct his wet vocal quality X 2. Recommend patient continue current diet. Patient's caregiver present Mardene Celeste) and educated on how to provide appropriate cueing and supervision. She verbalized and demonstrated understanding. Patient left upright in recliner with alarm on and all needs within reach. Continue with current plan of care.      Pain No/Denies Pain   Therapy/Group: Individual Therapy  Sanford Lindblad 09/23/2021, 6:28 AM

## 2021-09-24 LAB — GLUCOSE, CAPILLARY
Glucose-Capillary: 83 mg/dL (ref 70–99)
Glucose-Capillary: 120 mg/dL — ABNORMAL HIGH (ref 70–99)

## 2021-09-24 NOTE — Progress Notes (Signed)
PROGRESS NOTE   Subjective/Complaints: Pt up in room. No new issues..   Review of Systems  Constitutional: Negative.  Negative for chills, fever and malaise/fatigue.  HENT: Negative.    Eyes: Negative.   Respiratory:  Negative for cough, shortness of breath and wheezing.   Cardiovascular: Negative.  Negative for chest pain.  Gastrointestinal: Negative.  Negative for constipation, diarrhea and nausea.  Genitourinary:  Negative for dysuria.  Musculoskeletal:  Negative for myalgias.  Skin: Negative.   Neurological: Negative.  Negative for tremors and headaches.       Patient reports occasionally feeling weak during episodes of bradycardia.  Endo/Heme/Allergies: Negative.   Psychiatric/Behavioral: Negative.  Negative for depression.      Objective:   No results found. No results for input(s): WBC, HGB, HCT, PLT in the last 72 hours.  Recent Labs    09/22/21 0508  NA 138  K 4.6  CL 104  CO2 27  GLUCOSE 86  BUN 32*  CREATININE 1.33*  CALCIUM 9.2     Intake/Output Summary (Last 24 hours) at 09/24/2021 1308 Last data filed at 09/24/2021 1300 Gross per 24 hour  Intake 5110.53 ml  Output 1000 ml  Net 4110.53 ml         Physical Exam: Vital Signs Blood pressure 122/69, pulse 60, temperature (!) 95.8 F (35.4 C), temperature source Rectal, resp. rate 16, height '5\' 6"'$  (1.676 m), weight 86.7 kg, SpO2 100 %.  Constitutional: No distress . Vital signs reviewed. HEENT: NCAT, EOMI, oral membranes moist Neck: supple, soft Cardiovascular: RRR without murmur. No JVD, no additional heart sounds.   Respiratory/Chest: CTA Bilaterally without wheezes or rales. Normal effort    GI/Abdomen: BS +, non-tender, non-distended Ext: no clubbing, cyanosis, or edema Psych: pleasant and cooperative  Neuro:   Patient is alert and oriented to self and birth date.  Able to pay attention in conversation.  Basic insight is insight.  Able  to move all extremities, no limitations. Cognition improving. Clear speech. Skin: Warm and dry Musculoskeletal: normal rom, No pain with AROM or PROM in the neck, trunk, or extremities. Appropriate posture.  Assessment/Plan: 1. Functional deficits which require 3+ hours per day of interdisciplinary therapy in a comprehensive inpatient rehab setting. Physiatrist is providing close team supervision and 24 hour management of active medical problems listed below. Physiatrist and rehab team continue to assess barriers to discharge/monitor patient progress toward functional and medical goals  Care Tool:  Bathing    Body parts bathed by patient: Right arm, Left arm, Chest, Abdomen, Front perineal area, Buttocks, Right upper leg, Left upper leg, Right lower leg, Left lower leg, Face   Body parts bathed by helper: Right arm     Bathing assist Assist Level: Supervision/Verbal cueing     Upper Body Dressing/Undressing Upper body dressing   What is the patient wearing?: Pull over shirt    Upper body assist Assist Level: Supervision/Verbal cueing    Lower Body Dressing/Undressing Lower body dressing      What is the patient wearing?: Pants     Lower body assist Assist for lower body dressing: Supervision/Verbal cueing     Toileting Toileting    Toileting  assist Assist for toileting: Minimal Assistance - Patient > 75%     Transfers Chair/bed transfer  Transfers assist     Chair/bed transfer assist level: Minimal Assistance - Patient > 75%     Locomotion Ambulation   Ambulation assist      Assist level: Contact Guard/Touching assist Assistive device: No Device Max distance: 150   Walk 10 feet activity   Assist     Assist level: Contact Guard/Touching assist Assistive device: No Device   Walk 50 feet activity   Assist Walk 50 feet with 2 turns activity did not occur: Safety/medical concerns  Assist level: Contact Guard/Touching assist Assistive device:  No Device    Walk 150 feet activity   Assist Walk 150 feet activity did not occur: Safety/medical concerns  Assist level: Contact Guard/Touching assist Assistive device: No Device    Walk 10 feet on uneven surface  activity   Assist Walk 10 feet on uneven surfaces activity did not occur: Safety/medical concerns   Assist level: Contact Guard/Touching assist Assistive device: Other (comment) (no device)   Wheelchair     Assist Is the patient using a wheelchair?: Yes Type of Wheelchair: Manual    Wheelchair assist level: Dependent - Patient 0%      Wheelchair 50 feet with 2 turns activity    Assist        Assist Level: Dependent - Patient 0%   Wheelchair 150 feet activity     Assist      Assist Level: Dependent - Patient 0%   Blood pressure 122/69, pulse 60, temperature (!) 95.8 F (35.4 C), temperature source Rectal, resp. rate 16, height '5\' 6"'$  (1.676 m), weight 86.7 kg, SpO2 100 %. Medical Problem List and Plan: 1. Functional deficits secondary to acute encephalopathy with baseline intellectual disability  -DNR             -patient may shower             -ELOS/Goals: to SNF potentially  5/22             -Continue CIR therapies including PT, OT, and SLP              -Appreciate Assistance of Palliative Care.    2.  Antithrombotics: -DVT/anticoagulation:  Pharmaceutical: Lovenox             -antiplatelet therapy: N/A 3. Pain Management: Tylenol as needed 4. Mood: Provide emotional support             -antipsychotic agents: N/A 5. Neuropsych: This patient is not capable of making decisions on his own behalf. 6. Skin/Wound Care: Routine skin  checks 7. Fluids/Electrolytes/Nutrition:   -appetite seems reasonable so far 8.  Recurrent aspiration pneumonia.  Antibiotic therapy completed 09/06/2021.  Currently on dysphagia #1 nectar thick liquid diet.  Follow-up speech therapy  -has occ cough when eating but is easily able to clear.   - sitting up at  90 deg for meals  -family has been made aware of aspiration precautions--palliative care f/u.   -seems to be coping, managing better 5/20 Continue aspiration precautions with Palliative Care f/u. 9.  Hypotension.  ProAmatine 5 mg 3 times daily.  Monitor with increased mobility.     -5/17 bp controlled 5/20 B/p remains controlled, continue ProAmatine 5 mg TID.     09/24/2021    5:22 AM 09/24/2021    4:22 AM 09/24/2021    4:16 AM  Vitals with BMI  Weight 191 lbs 2 oz  194 lbs 7 oz  BMI 37.62  83.1  Systolic  517   Diastolic  69   Pulse  60     10.  Diabetes mellitus.  Latest hemoglobin A1c 6.4.  SSI.  Patient on Glucophage 1000 mg daily prior to admission.  Resume as needed.  -CBG (last 3)  Recent Labs    09/23/21 0538 09/23/21 0608 09/24/21 0547  GLUCAP 86 107* 83    5/19 have changed cbgs to am only  5/20 Continue CBG's am only, well controlled, glucophage not needed. 11.  Normocytic anemia.  Patient with hematology work-up in the past.  Follow-up CBC  -had one + test in March   -5/16 hgb up to 9.5 but likely concentrated 12.  Hyperlipidemia.  Patient on Lopid/Lipitor prior to admission.  Currently on hold due to dysphagia 13. Recurrent hypothermia with unclear cause             -asymptomatic, no associated sx  -not sure that these temp readings are accurate  -no further action indicated at this time             -5/20 Received nurse call that pt had another hypothermic event last night, vital signs otherwise normal, no cognitive changes.  Continue to monitor. 14. AKI on CKD stage IIIa.    -Cr stable to sl elevated  -5/18 BUN a little better at 32--recheck 5/22  -continue 1/2ns at HS thru tonight  -pushing po fluids  -pt needs cueing to drink. Can do fine if cued            -5/20 Continue cues for pt to drink po fluids, recheck BMET on 5/22 BMET    Component Value Date/Time   NA 138 09/22/2021 0508   K 4.6 09/22/2021 0508   CL 104 09/22/2021 0508   CO2 27 09/22/2021  0508   GLUCOSE 86 09/22/2021 0508   BUN 32 (H) 09/22/2021 0508   CREATININE 1.33 (H) 09/22/2021 0508   CREATININE 1.32 (H) 06/17/2021 1610   CALCIUM 9.2 09/22/2021 0508   GFRNONAA 57 (L) 09/22/2021 0508    15. GERD. Continue PPI 16. Bradycardia. Seen by cardiology. Continue to monitor  -  Patient asymptomatic  -5/19 no changes             -5/20 no changes, continue to monitor  LOS: 17 days A FACE TO FACE EVALUATION WAS PERFORMED  Davide Risdon 09/24/2021, 1:08 PM

## 2021-09-25 LAB — GLUCOSE, CAPILLARY: Glucose-Capillary: 79 mg/dL (ref 70–99)

## 2021-09-25 NOTE — Progress Notes (Signed)
Physical Therapy Session Note  Patient Details  Name: Daniel Bailey. MRN: [id number] Date of Birth: 03-06-1951  Today's Date: 09/25/2021 PT Individual Time: 5027-7412 PT Individual Time Calculation (min): 30 min   Short Term Goals: Week 3:  PT Short Term Goal 1 (Week 3): STG=LTG due to patient pending SNF placement.  Skilled Therapeutic Interventions/Progress Updates:     Patient in recliner in the room upon PT arrival. Patient alert and agreeable to PT session. Patient denied pain and dizziness throughout session. Discussed d/c plan and expected progression with therapy at SNF. Patient stated understanding.   Therapeutic Activity: Bed Mobility: Patient performed rolling R/L and supine to/from sit with supervision-mod I in a flat bed without use of bed rails. Transfers: Patient performed sit to/from stand x4 with supervision. Provided verbal cues for forward weight shift and scooting forward. Patient performed a simulated sedan height car transfer with supervision without AD. Provided cues for safe technique.  Gait Training:  Patient ambulated >150 feet x2 without AD with close supervision. Ambulated with decreased gait speed, decreased step length and height, forward trunk lean, and downward head gaze. Provided verbal cues for erect posture and looking ahead. Patient ambulated up/down a ramp with CGA without AD. Provided min cues for technique/safety.  Patient in recliner in the room at end of session with breaks locked, seat belt alarm set, and all needs within reach. Patient requesting water, NT made aware to bring nectar thick water to patient when able.  Therapy Documentation Precautions:  Precautions Precautions: Fall Restrictions Weight Bearing Restrictions: No    Therapy/Group: Individual Therapy  Kamari Bilek L Kamaryn Grimley PT, DPT  09/25/2021, 12:38 PM

## 2021-09-25 NOTE — Progress Notes (Signed)
PROGRESS NOTE   Subjective/Complaints:  Pt seen in bed no issues overnight.  Slept well, feels well this morning.  Review of Systems  Constitutional: Negative.  Negative for chills, fever and malaise/fatigue.  HENT: Negative.    Eyes: Negative.   Respiratory:  Negative for cough, shortness of breath and wheezing.   Cardiovascular: Negative.  Negative for chest pain.  Gastrointestinal: Negative.  Negative for constipation, diarrhea and nausea.  Genitourinary:  Negative for dysuria.  Musculoskeletal:  Negative for myalgias.  Skin: Negative.   Neurological: Negative.  Negative for tremors and headaches.       Patient reports occasionally feeling weak during episodes of bradycardia.  Endo/Heme/Allergies: Negative.   Psychiatric/Behavioral: Negative.  Negative for depression.      Objective:   No results found. No results for input(s): WBC, HGB, HCT, PLT in the last 72 hours.  No results for input(s): NA, K, CL, CO2, GLUCOSE, BUN, CREATININE, CALCIUM in the last 72 hours.   Intake/Output Summary (Last 24 hours) at 09/25/2021 1222 Last data filed at 09/24/2021 1806 Gross per 24 hour  Intake 480 ml  Output --  Net 480 ml         Physical Exam: Vital Signs Blood pressure (!) 105/57, pulse (!) 57, temperature (!) 96.8 F (36 C), resp. rate 16, height '5\' 6"'$  (1.676 m), weight 86.7 kg, SpO2 97 %.  Constitutional: No distress. Vital signs reviewed. HEENT: NCAT, EOMI, oral membranes moist Neck: supple, soft Cardiovascular: RRR without murmur. No JVD, no additional heart sounds.   Respiratory/Chest: CTA Bilaterally without wheezes or rales. Normal effort    GI/Abdomen: BS +, non-tender, non-distended Ext: no clubbing, cyanosis, or edema Psych: pleasant and cooperative  Neuro:   Patient is alert and oriented to self and birth date.  Able to pay attention in conversation.  Basic insight is insight.  Able to move all  extremities, no limitations. Cognition improving. Clear speech. Skin: Warm and dry Musculoskeletal: normal rom, No pain with AROM or PROM in the neck, trunk, or extremities. Appropriate posture.  Assessment/Plan: 1. Functional deficits which require 3+ hours per day of interdisciplinary therapy in a comprehensive inpatient rehab setting. Physiatrist is providing close team supervision and 24 hour management of active medical problems listed below. Physiatrist and rehab team continue to assess barriers to discharge/monitor patient progress toward functional and medical goals  Care Tool:  Bathing    Body parts bathed by patient: Right arm, Left arm, Chest, Abdomen, Front perineal area, Buttocks, Right upper leg, Left upper leg, Right lower leg, Left lower leg, Face   Body parts bathed by helper: Right arm     Bathing assist Assist Level: Supervision/Verbal cueing     Upper Body Dressing/Undressing Upper body dressing   What is the patient wearing?: Pull over shirt    Upper body assist Assist Level: Supervision/Verbal cueing    Lower Body Dressing/Undressing Lower body dressing      What is the patient wearing?: Pants     Lower body assist Assist for lower body dressing: Supervision/Verbal cueing     Toileting Toileting    Toileting assist Assist for toileting: Minimal Assistance - Patient > 75%  Transfers Chair/bed transfer  Transfers assist     Chair/bed transfer assist level: Minimal Assistance - Patient > 75%     Locomotion Ambulation   Ambulation assist      Assist level: Contact Guard/Touching assist Assistive device: No Device Max distance: 150   Walk 10 feet activity   Assist     Assist level: Contact Guard/Touching assist Assistive device: No Device   Walk 50 feet activity   Assist Walk 50 feet with 2 turns activity did not occur: Safety/medical concerns  Assist level: Contact Guard/Touching assist Assistive device: No Device     Walk 150 feet activity   Assist Walk 150 feet activity did not occur: Safety/medical concerns  Assist level: Contact Guard/Touching assist Assistive device: No Device    Walk 10 feet on uneven surface  activity   Assist Walk 10 feet on uneven surfaces activity did not occur: Safety/medical concerns   Assist level: Contact Guard/Touching assist Assistive device: Other (comment) (no device)   Wheelchair     Assist Is the patient using a wheelchair?: Yes Type of Wheelchair: Manual    Wheelchair assist level: Dependent - Patient 0%      Wheelchair 50 feet with 2 turns activity    Assist        Assist Level: Dependent - Patient 0%   Wheelchair 150 feet activity     Assist      Assist Level: Dependent - Patient 0%   Blood pressure (!) 105/57, pulse (!) 57, temperature (!) 96.8 F (36 C), resp. rate 16, height '5\' 6"'$  (1.676 m), weight 86.7 kg, SpO2 97 %. Medical Problem List and Plan: 1. Functional deficits secondary to acute encephalopathy with baseline intellectual disability  -DNR             -patient may shower             -ELOS/Goals: to SNF potentially  5/22             -Continue CIR therapies including PT, OT, and SLP              -Appreciate Assistance of Palliative Care.    2.  Antithrombotics: -DVT/anticoagulation:  Pharmaceutical: Lovenox             -antiplatelet therapy: N/A 3. Pain Management: Tylenol as needed 4. Mood: Provide emotional support             -antipsychotic agents: N/A 5. Neuropsych: This patient is not capable of making decisions on his own behalf. 6. Skin/Wound Care: Routine skin  checks 7. Fluids/Electrolytes/Nutrition:   -appetite seems reasonable so far 8.  Recurrent aspiration pneumonia.  Antibiotic therapy completed 09/06/2021.  Currently on dysphagia #1 nectar thick liquid diet.  Follow-up speech therapy  -has occ cough when eating but is easily able to clear.   - sitting up at 90 deg for meals  -family has  been made aware of aspiration precautions--palliative care f/u.   -seems to be coping, managing better 5/20 Continue aspiration precautions with Palliative Care f/u. 9.  Hypotension.  ProAmatine 5 mg 3 times daily.  Monitor with increased mobility.     -5/17 bp controlled 5/20 B/p remains controlled, continue ProAmatine 5 mg TID. 5/21 Continue ProAmatine 5 mg TID.     09/25/2021    5:00 AM 09/25/2021    4:53 AM 09/24/2021    8:09 PM  Vitals with BMI  Weight 191 lbs 2 oz    BMI 26.94    Systolic  275 170  Diastolic  57 63  Pulse  57 56    10.  Diabetes mellitus.  Latest hemoglobin A1c 6.4.  SSI.  Patient on Glucophage 1000 mg daily prior to admission.  Resume as needed.  5/21 CBG's last 24 hrs 79-120.  -CBG (last 3)  Recent Labs    09/24/21 0547 09/24/21 2036 09/25/21 0517  GLUCAP 83 120* 79    5/19 have changed cbgs to am only  5/20 Continue CBG's am only, well controlled, glucophage not needed. 11.  Normocytic anemia.  Patient with hematology work-up in the past.  Follow-up CBC  -had one + test in March   -5/16 hgb up to 9.5 but likely concentrated 12.  Hyperlipidemia.  Patient on Lopid/Lipitor prior to admission.  Currently on hold due to dysphagia 13. Recurrent hypothermia with unclear cause             -asymptomatic, no associated sx  -not sure that these temp readings are accurate  -no further action indicated at this time             -5/20 Received nurse call that pt had another hypothermic event last night, vital signs otherwise normal, no cognitive changes.  Continue to monitor 5/21 No changes, CTM 14. AKI on CKD stage IIIa.    -Cr stable to sl elevated  -5/18 BUN a little better at 32--recheck 5/22  -continue 1/2ns at HS thru tonight  -pushing po fluids  -pt needs cueing to drink. Can do fine if cued            -5/20 Continue cues for pt to drink po fluids, recheck BMET on 5/22 BMET 5/21 Trend with qMonday labs.    Component Value Date/Time   NA 138  09/22/2021 0508   K 4.6 09/22/2021 0508   CL 104 09/22/2021 0508   CO2 27 09/22/2021 0508   GLUCOSE 86 09/22/2021 0508   BUN 32 (H) 09/22/2021 0508   CREATININE 1.33 (H) 09/22/2021 0508   CREATININE 1.32 (H) 06/17/2021 1610   CALCIUM 9.2 09/22/2021 0508   GFRNONAA 57 (L) 09/22/2021 0508    15. GERD. Continue PPI 16. Bradycardia. Seen by cardiology. Continue to monitor  -  Patient asymptomatic  -5/19 no changes             -5/20 no changes, continue to monitor             5/21 Asymptomatic today, HR upper 50's  LOS: 18 days A FACE TO FACE EVALUATION WAS PERFORMED  Pranav Lince 09/25/2021, 12:22 PM

## 2021-09-26 DIAGNOSIS — I9589 Other hypotension: Secondary | ICD-10-CM | POA: Diagnosis not present

## 2021-09-26 DIAGNOSIS — R001 Bradycardia, unspecified: Secondary | ICD-10-CM | POA: Diagnosis not present

## 2021-09-26 DIAGNOSIS — D61818 Other pancytopenia: Secondary | ICD-10-CM | POA: Diagnosis not present

## 2021-09-26 DIAGNOSIS — T68XXXA Hypothermia, initial encounter: Secondary | ICD-10-CM | POA: Diagnosis not present

## 2021-09-26 DIAGNOSIS — G9349 Other encephalopathy: Secondary | ICD-10-CM | POA: Diagnosis not present

## 2021-09-26 DIAGNOSIS — Z7189 Other specified counseling: Secondary | ICD-10-CM | POA: Diagnosis not present

## 2021-09-26 DIAGNOSIS — E875 Hyperkalemia: Secondary | ICD-10-CM | POA: Diagnosis not present

## 2021-09-26 DIAGNOSIS — N182 Chronic kidney disease, stage 2 (mild): Secondary | ICD-10-CM | POA: Diagnosis not present

## 2021-09-26 DIAGNOSIS — R6520 Severe sepsis without septic shock: Secondary | ICD-10-CM | POA: Diagnosis not present

## 2021-09-26 DIAGNOSIS — R6889 Other general symptoms and signs: Secondary | ICD-10-CM | POA: Diagnosis not present

## 2021-09-26 DIAGNOSIS — G4730 Sleep apnea, unspecified: Secondary | ICD-10-CM | POA: Diagnosis not present

## 2021-09-26 DIAGNOSIS — E1122 Type 2 diabetes mellitus with diabetic chronic kidney disease: Secondary | ICD-10-CM | POA: Diagnosis not present

## 2021-09-26 DIAGNOSIS — E785 Hyperlipidemia, unspecified: Secondary | ICD-10-CM | POA: Diagnosis not present

## 2021-09-26 DIAGNOSIS — J9811 Atelectasis: Secondary | ICD-10-CM | POA: Diagnosis not present

## 2021-09-26 DIAGNOSIS — D72819 Decreased white blood cell count, unspecified: Secondary | ICD-10-CM | POA: Diagnosis not present

## 2021-09-26 DIAGNOSIS — Z66 Do not resuscitate: Secondary | ICD-10-CM | POA: Diagnosis not present

## 2021-09-26 DIAGNOSIS — D508 Other iron deficiency anemias: Secondary | ICD-10-CM | POA: Diagnosis not present

## 2021-09-26 DIAGNOSIS — R1312 Dysphagia, oropharyngeal phase: Secondary | ICD-10-CM | POA: Diagnosis not present

## 2021-09-26 DIAGNOSIS — M6281 Muscle weakness (generalized): Secondary | ICD-10-CM | POA: Diagnosis not present

## 2021-09-26 DIAGNOSIS — R42 Dizziness and giddiness: Secondary | ICD-10-CM | POA: Diagnosis not present

## 2021-09-26 DIAGNOSIS — N179 Acute kidney failure, unspecified: Secondary | ICD-10-CM | POA: Diagnosis not present

## 2021-09-26 DIAGNOSIS — G9340 Encephalopathy, unspecified: Secondary | ICD-10-CM | POA: Diagnosis not present

## 2021-09-26 DIAGNOSIS — J9809 Other diseases of bronchus, not elsewhere classified: Secondary | ICD-10-CM | POA: Diagnosis not present

## 2021-09-26 DIAGNOSIS — Z8616 Personal history of COVID-19: Secondary | ICD-10-CM | POA: Diagnosis not present

## 2021-09-26 DIAGNOSIS — R680 Hypothermia, not associated with low environmental temperature: Secondary | ICD-10-CM | POA: Diagnosis not present

## 2021-09-26 DIAGNOSIS — F79 Unspecified intellectual disabilities: Secondary | ICD-10-CM | POA: Diagnosis not present

## 2021-09-26 DIAGNOSIS — E876 Hypokalemia: Secondary | ICD-10-CM | POA: Diagnosis not present

## 2021-09-26 DIAGNOSIS — J690 Pneumonitis due to inhalation of food and vomit: Secondary | ICD-10-CM | POA: Diagnosis not present

## 2021-09-26 DIAGNOSIS — R2689 Other abnormalities of gait and mobility: Secondary | ICD-10-CM | POA: Diagnosis not present

## 2021-09-26 DIAGNOSIS — Z79899 Other long term (current) drug therapy: Secondary | ICD-10-CM | POA: Diagnosis not present

## 2021-09-26 DIAGNOSIS — I10 Essential (primary) hypertension: Secondary | ICD-10-CM | POA: Diagnosis not present

## 2021-09-26 DIAGNOSIS — R278 Other lack of coordination: Secondary | ICD-10-CM | POA: Diagnosis not present

## 2021-09-26 DIAGNOSIS — R911 Solitary pulmonary nodule: Secondary | ICD-10-CM | POA: Diagnosis not present

## 2021-09-26 DIAGNOSIS — K219 Gastro-esophageal reflux disease without esophagitis: Secondary | ICD-10-CM | POA: Diagnosis not present

## 2021-09-26 DIAGNOSIS — Z515 Encounter for palliative care: Secondary | ICD-10-CM | POA: Diagnosis not present

## 2021-09-26 DIAGNOSIS — A419 Sepsis, unspecified organism: Secondary | ICD-10-CM | POA: Diagnosis not present

## 2021-09-26 DIAGNOSIS — D696 Thrombocytopenia, unspecified: Secondary | ICD-10-CM | POA: Diagnosis not present

## 2021-09-26 DIAGNOSIS — N1832 Chronic kidney disease, stage 3b: Secondary | ICD-10-CM | POA: Diagnosis not present

## 2021-09-26 DIAGNOSIS — I129 Hypertensive chronic kidney disease with stage 1 through stage 4 chronic kidney disease, or unspecified chronic kidney disease: Secondary | ICD-10-CM | POA: Diagnosis not present

## 2021-09-26 DIAGNOSIS — Z841 Family history of disorders of kidney and ureter: Secondary | ICD-10-CM | POA: Diagnosis not present

## 2021-09-26 DIAGNOSIS — D638 Anemia in other chronic diseases classified elsewhere: Secondary | ICD-10-CM | POA: Diagnosis not present

## 2021-09-26 LAB — BASIC METABOLIC PANEL
Anion gap: 7 (ref 5–15)
BUN: 35 mg/dL — ABNORMAL HIGH (ref 8–23)
CO2: 27 mmol/L (ref 22–32)
Calcium: 9.1 mg/dL (ref 8.9–10.3)
Chloride: 105 mmol/L (ref 98–111)
Creatinine, Ser: 1.31 mg/dL — ABNORMAL HIGH (ref 0.61–1.24)
GFR, Estimated: 58 mL/min — ABNORMAL LOW (ref >60)
Glucose, Bld: 85 mg/dL (ref 70–99)
Potassium: 4.4 mmol/L (ref 3.5–5.1)
Sodium: 139 mmol/L (ref 135–145)

## 2021-09-26 LAB — CBC
HCT: 27.1 % — ABNORMAL LOW (ref 39.0–52.0)
Hemoglobin: 9.1 g/dL — ABNORMAL LOW (ref 13.0–17.0)
MCH: 31.9 pg (ref 26.0–34.0)
MCHC: 33.6 g/dL (ref 30.0–36.0)
MCV: 95.1 fL (ref 80.0–100.0)
Platelets: 232 10*3/uL (ref 150–400)
RBC: 2.85 MIL/uL — ABNORMAL LOW (ref 4.22–5.81)
RDW: 17.3 % — ABNORMAL HIGH (ref 11.5–15.5)
WBC: 2.8 10*3/uL — ABNORMAL LOW (ref 4.0–10.5)
nRBC: 0 % (ref 0.0–0.2)

## 2021-09-26 LAB — GLUCOSE, CAPILLARY: Glucose-Capillary: 88 mg/dL (ref 70–99)

## 2021-09-26 NOTE — Progress Notes (Signed)
Inpatient Rehabilitation Discharge Medication Review by a Pharmacist  A complete drug regimen review was completed for this patient to identify any potential clinically significant medication issues.  High Risk Drug Classes Is patient taking? Indication by Medication  Antipsychotic No   Anticoagulant No   Antibiotic No   Opioid No   Antiplatelet No   Hypoglycemics/insulin No   Vasoactive Medication Yes Midodrine- orthostatic hypotension  Chemotherapy No   Other Yes Protonix- GERD Ferrous sulfate- iron deficient anemia     Type of Medication Issue Identified Description of Issue Recommendation(s)  Drug Interaction(s) (clinically significant)     Duplicate Therapy     Allergy     No Medication Administration End Date     Incorrect Dose     Additional Drug Therapy Needed     Significant med changes from prior encounter (inform family/care partners about these prior to discharge).    Other       Clinically significant medication issues were identified that warrant physician communication and completion of prescribed/recommended actions by midnight of the next day:  No  Time spent performing this drug regimen review (minutes):  30   Ronia Hazelett BS, PharmD, BCPS Clinical Pharmacist 09/26/2021 8:45 AM  Contact: [phone] after 3 PM  "Be curious, not judgmental..." -Jamal Maes

## 2021-09-26 NOTE — Progress Notes (Signed)
Report called to [place] at [facility]. Pt discharged with family transport to facility. Belongings and discharge packet given to family member. PIV removed. Pt and family have no further questions

## 2021-09-26 NOTE — Progress Notes (Signed)
Inpatient Rehabilitation Care Coordinator Discharge Note   Patient Details  Name: Daniel Bailey. MRN: [id number] Date of Birth: 1951/02/02   Discharge location: D/c to SNF- [facility]  Length of Stay: 18 days  Discharge activity level: ambulatory level [place]  Home/community participation: Limited  Patient response [id number] Literacy - How often do you need to have someone help you when you read instructions, pamphlets, or other written material from your doctor or pharmacy?: Often  Patient response [id number] Isolation - How often do you feel lonely or isolated from those around you?: Patient unable to respond  Services provided included: MD, RD, SLP, CM, Pharmacy, SW, Neuropsych, TR, RN, PT, OT  Financial Services:  Financial Services Utilized: Medicare    Choices offered to/list presented to: Yes  Follow-up services arranged:     Patient response to transportation need: Is the patient able to respond to transportation needs?: Yes In the past 12 months, has lack of transportation kept you from medical appointments or from getting medications?: No In the past 12 months, has lack of transportation kept you from meetings, work, or from getting things needed for daily living?: No    Comments (or additional information):  Patient/Family verbalized understanding of follow-up arrangements:  Yes  Individual responsible for coordination of the follow-up plan: contact pt cousin/POA [name]  Confirmed correct DME delivered: Rana Snare 09/26/2021    Rana Snare

## 2021-09-26 NOTE — Progress Notes (Signed)
Patient ID: Daniel Bailey., male   DOB: Mar 11, 1951, 71 y.o.   MRN: [id number]  SW confirmed with Michelle/Admissions with [facility] d/c to SNF today.   SW discussed above with pt assigned RN, and d/c packet left at front desk.   Loralee Pacas, MSW, [place] Office: [phone] Cell: [phone] Fax: [phone]

## 2021-09-26 NOTE — Progress Notes (Signed)
Speech Language Pathology Discharge Summary  Patient Details  Name: Daniel Bailey. MRN: [id number] Date of Birth: 03-20-1951  Today's Date: 09/26/2021 SLP Individual Time: 6812-7517 SLP Individual Time Calculation (min): 15 min   Skilled Therapeutic Interventions:  Skilled treatment session focused on dysphagia goals. SLP facilitated session by providing skilled observation with 4 oz of nectar-thick liquids. Patient required supervision verbal cues for use of small bites/sips resulting in one overt coughing episode. Recommend patient continue current diet. Patient independently recalled transfer to another facility today and was optimistic about progress. Patient left sitting EOB with alarm on and all needs within reach.   Patient has met 2 of 2 long term goals.  Patient to discharge at overall Supervision;Min level.   Reasons goals not met: N/A   Clinical Impression/Discharge Summary: Patient has made functional gains and has met 2 of 2 LTGs this admission. Patient was admitted to [facility] on this admission as well as a previous admission in March with aspiration PNA. MBS showed a moderate-severe pharyngeal dysphagia due to significant pharyngeal residue. Patient's family declined alternative means of nutrition, therefore, patient placed on the safest diet recommendation of Dys. 1 textures with nectar-thick liquids. Currently, patient is consuming this diet with minimal overt s/s of aspiration but requires Min verbal cues for use of swallowing compensatory strategies such as small bites/sips, a slow rate of self-feeding, out of bed for meals, and an intermittent cough and re-swallow. Patient and caregiver education is complete but patient's family is unable to provide the necessary level of assistance needed at this time, therefore, patient will discharge to a SNF. Patient would benefit from f/u SLP services to maximize his swallowing function and minimize aspiration risk.   Care Partner:  Caregiver  Able to Provide Assistance: No  Type of Caregiver Assistance: Physical;Cognitive  Recommendation:  Skilled Nursing facility  Rationale for SLP Follow Up: Reduce caregiver burden;Maximize swallowing safety   Equipment: N/A   Reasons for discharge: Discharged from hospital;Treatment goals met   Patient/Family Agrees with Progress Made and Goals Achieved: Yes    [place], [place] 09/26/2021, 6:31 AM

## 2021-09-26 NOTE — Progress Notes (Signed)
PROGRESS NOTE   Subjective/Complaints:  Pt up eob, no new problems.   Review of Systems  Constitutional: Negative.  Negative for chills, fever and malaise/fatigue.  HENT: Negative.    Eyes: Negative.   Respiratory:  Negative for cough, shortness of breath and wheezing.   Cardiovascular: Negative.  Negative for chest pain.  Gastrointestinal: Negative.  Negative for constipation, diarrhea and nausea.  Genitourinary:  Negative for dysuria.  Musculoskeletal:  Negative for myalgias.  Skin: Negative.   Neurological:  Negative for tremors and headaches.          Endo/Heme/Allergies: Negative.   Psychiatric/Behavioral: Negative.  Negative for depression.      Objective:   No results found. Recent Labs    09/26/21 0632  WBC 2.8*  HGB 9.1*  HCT 27.1*  PLT 232    Recent Labs    09/26/21 0632  NA 139  K 4.4  CL 105  CO2 27  GLUCOSE 85  BUN 35*  CREATININE 1.31*  CALCIUM 9.1     Intake/Output Summary (Last 24 hours) at 09/26/2021 0928 Last data filed at 09/26/2021 0741 Gross per 24 hour  Intake 835 ml  Output --  Net 835 ml         Physical Exam: Vital Signs Blood pressure 107/63, pulse (!) 55, temperature 98.4 F (36.9 C), resp. rate 17, height '5\' 6"'$  (1.676 m), weight 87.4 kg, SpO2 100 %.  Constitutional: No distress . Vital signs reviewed. HEENT: NCAT, EOMI, oral membranes moist Neck: supple Cardiovascular: RRR without murmur. No JVD    Respiratory/Chest: CTA Bilaterally without wheezes or rales. Normal effort    GI/Abdomen: BS +, non-tender, non-distended Ext: no clubbing, cyanosis, or edema Psych: pleasant and cooperative  Neuro:  alert, follows commands.  Basic insight is insight.  Able to move all extremities, no limitations. Cognition improving. Clear speech. Good sitting balance Skin: Warm and dry Musculoskeletal: normal rom, No pain with AROM or PROM in the neck, trunk, or extremities.  Appropriate posture.  Assessment/Plan: 1. Functional deficits which require 3+ hours per day of interdisciplinary therapy in a comprehensive inpatient rehab setting. Physiatrist is providing close team supervision and 24 hour management of active medical problems listed below. Physiatrist and rehab team continue to assess barriers to discharge/monitor patient progress toward functional and medical goals  Care Tool:  Bathing    Body parts bathed by patient: Right arm, Left arm, Chest, Abdomen, Front perineal area, Buttocks, Right upper leg, Left upper leg, Right lower leg, Left lower leg, Face   Body parts bathed by helper: Right arm     Bathing assist Assist Level: Supervision/Verbal cueing     Upper Body Dressing/Undressing Upper body dressing   What is the patient wearing?: Pull over shirt    Upper body assist Assist Level: Supervision/Verbal cueing    Lower Body Dressing/Undressing Lower body dressing      What is the patient wearing?: Pants     Lower body assist Assist for lower body dressing: Supervision/Verbal cueing     Toileting Toileting    Toileting assist Assist for toileting: Minimal Assistance - Patient > 75%     Transfers Chair/bed transfer  Transfers assist  Chair/bed transfer assist level: Minimal Assistance - Patient > 75%     Locomotion Ambulation   Ambulation assist      Assist level: Contact Guard/Touching assist Assistive device: No Device Max distance: 150   Walk 10 feet activity   Assist     Assist level: Contact Guard/Touching assist Assistive device: No Device   Walk 50 feet activity   Assist Walk 50 feet with 2 turns activity did not occur: Safety/medical concerns  Assist level: Contact Guard/Touching assist Assistive device: No Device    Walk 150 feet activity   Assist Walk 150 feet activity did not occur: Safety/medical concerns  Assist level: Contact Guard/Touching assist Assistive device: No  Device    Walk 10 feet on uneven surface  activity   Assist Walk 10 feet on uneven surfaces activity did not occur: Safety/medical concerns   Assist level: Contact Guard/Touching assist Assistive device: Other (comment) (no device)   Wheelchair     Assist Is the patient using a wheelchair?: Yes Type of Wheelchair: Manual    Wheelchair assist level: Dependent - Patient 0%      Wheelchair 50 feet with 2 turns activity    Assist        Assist Level: Dependent - Patient 0%   Wheelchair 150 feet activity     Assist      Assist Level: Dependent - Patient 0%   Blood pressure 107/63, pulse (!) 55, temperature 98.4 F (36.9 C), resp. rate 17, height '5\' 6"'$  (1.676 m), weight 87.4 kg, SpO2 100 %. Medical Problem List and Plan: 1. Functional deficits secondary to acute encephalopathy with baseline intellectual disability  -DNR             -patient may shower             -ELOS/Goals: to SNF today             -Appreciate Assistance of Palliative Care.    2.  Antithrombotics: -DVT/anticoagulation:  Pharmaceutical: Lovenox             -antiplatelet therapy: N/A 3. Pain Management: Tylenol as needed 4. Mood: Provide emotional support             -antipsychotic agents: N/A 5. Neuropsych: This patient is not capable of making decisions on his own behalf. 6. Skin/Wound Care: Routine skin  checks 7. Fluids/Electrolytes/Nutrition:   -appetite seems reasonable so far 8.  Recurrent aspiration pneumonia.  Antibiotic therapy completed 09/06/2021.  Currently on dysphagia #1 nectar thick liquid diet.  Follow-up speech therapy  -has occ cough when eating but is easily able to clear.   - sitting up at 90 deg for meals  -family has been made aware of aspiration precautions--palliative care f/u.   -managing better 5/22 Continue aspiration precautions with Palliative Care f/u. 9.  Hypotension.  ProAmatine 5 mg 3 times daily.  Monitor with increased mobility.     -5/17 bp  controlled 5/20 B/p remains controlled, continue ProAmatine 5 mg TID. 5/22 Continue ProAmatine 5 mg TID.     09/26/2021    5:19 AM 09/26/2021    5:16 AM 09/25/2021    7:54 PM  Vitals with BMI  Weight  192 lbs 11 oz   BMI  10.93   Systolic 235    Diastolic 63    Pulse 55  60    10.  Diabetes mellitus.  Latest hemoglobin A1c 6.4.  SSI.  Patient on Glucophage 1000 mg daily prior to admission.  Resume as  needed.  5/21 CBG's last 24 hrs 79-120.  -CBG (last 3)  Recent Labs    09/24/21 2036 09/25/21 0517 09/26/21 0607  GLUCAP 120* 79 88    5/19 have changed cbgs to am only  5/20 Continue CBG's am only, well controlled, glucophage not needed. 11.  Normocytic anemia.  Patient with hematology work-up in the past.  Follow-up CBC  -had one + test in March   -5/16 hgb stable at 9.1 12.  Hyperlipidemia.  Patient on Lopid/Lipitor prior to admission.  Currently on hold due to dysphagia 13. Recurrent hypothermia with unclear cause             -asymptomatic, no associated sx  -not sure that these temp readings are accurate  -no further action indicated at this time             -5/22 persistent 5/21 No changes, CTM 14. AKI on CKD stage IIIa.    -Cr stable to sl elevated  -5/18 BUN a little better at 32--recheck 5/22  -continue 1/2ns at HS thru tonight  -pushing po fluids  -pt needs cueing to drink. Can do fine if cued            -5/22--BUN still elevated, actually up sl.  Recommend IVF at nights for a week.    Component Value Date/Time   NA 139 09/26/2021 0632   K 4.4 09/26/2021 0632   CL 105 09/26/2021 0632   CO2 27 09/26/2021 0632   GLUCOSE 85 09/26/2021 0632   BUN 35 (H) 09/26/2021 0632   CREATININE 1.31 (H) 09/26/2021 0632   CREATININE 1.32 (H) 06/17/2021 1610   CALCIUM 9.1 09/26/2021 0632   GFRNONAA 58 (L) 09/26/2021 0632    15. GERD. Continue PPI 16. Bradycardia. Seen by cardiology. Continue to monitor  -  Patient asymptomatic 5/22  LOS: 19 days A FACE TO [place] 09/26/2021, 9:28 AM

## 2021-09-26 NOTE — Progress Notes (Signed)
Occupational Therapy Discharge Summary  Patient Details  Name: Daniel Bailey. MRN: [id number] Date of Birth: 02/11/1951  Today's Date: 09/26/2021 OT Individual Time: 1950-9326 OT Individual Time Calculation (min): 28 min   Pt greeted seated EOB coloring, agreeable to OT treatment session. Pt completed ambulation, toileting and dressing tasks at supervision level. While seated on commode, pt with episode emesis. Pt reported it came out of nowhere, but felt better afterwards. Pt with smear of BM and able to perform peri-care in standing w/ Supervision. Pt stood at the sink to wash hands, then finished dressing from recliner w/ supervision. Pt left seated in recliner with alarm belt on, call bell in reach, and needs met.    Patient has met 7 of 7 long term goals due to improved activity tolerance, improved balance, postural control, ability to compensate for deficits, improved attention, improved awareness, and improved coordination.  Patient to discharge at overall Supervision level.  Patient's care partner unavailable to provide the necessary physical and cognitive assistance at discharge therefore pt discharging to SNF for continued OT at next venue of care.     Reasons goals not met: n/a  Recommendation:  Patient will benefit from ongoing skilled OT services in skilled nursing facility setting to continue to advance functional skills in the area of BADL.  Equipment: No equipment provided  Reasons for discharge: treatment goals met and discharge from hospital  Patient/family agrees with progress made and goals achieved: Yes  OT Discharge Precautions/Restrictions  Precautions Precautions: Fall Restrictions Weight Bearing Restrictions: No Pain Pain Assessment Pain Scale: 0-10 Pain Score: 0-No pain ADL ADL Grooming: Supervision/safety Where Assessed-Grooming: Wheelchair Upper Body Bathing: Supervision/safety Where Assessed-Upper Body Bathing: Shower Lower Body Bathing:  Supervision/safety Where Assessed-Lower Body Bathing: Shower Upper Body Dressing: Supervision/safety Where Assessed-Upper Body Dressing: Chair Lower Body Dressing: Supervision/safety Where Assessed-Lower Body Dressing: Chair Toileting: Supervision/safety, Minimal assistance (sometimes min A for thoroughness of posterior hygiene) Where Assessed-Toileting: Bedside Commode Toilet Transfer: Close supervision Toilet Transfer Method: Counselling psychologist: Geophysical data processor: Close supervision Social research officer, government Method: Heritage manager: Radio broadcast assistant, Grab bars ADL Comments: significant effort and assist to reach to LE's for LB bathing and dressing, was able to cross L LE up for garment management but unable to cross R LE up close enough, set up and cues for all steps of self care due to decreased initiation and cog/processing, used suction toothbrush due to dysphasia Vision Eye Alignment: Within Functional Limits Ocular Range of Motion: Within Functional Limits Alignment/Gaze Preference: Within Defined Limits Tracking/Visual Pursuits: Able to track stimulus in all quads without difficulty;Requires cues, head turns, or add eye shifts to track Perception  Perception: Within Functional Limits Praxis Praxis: Intact Cognition Cognition Overall Cognitive Status: History of cognitive impairments - at baseline Arousal/Alertness: Awake/alert Orientation Level: Person;Place Memory: Impaired Memory Impairment: Decreased recall of new information;Decreased short term memory Decreased Short Term Memory: Verbal basic;Functional basic Attention: Sustained;Divided Sustained Attention: Appears intact Divided Attention: Impaired Awareness: Impaired Awareness Impairment: Emergent impairment Problem Solving: Impaired Problem Solving Impairment: Functional basic Executive Function: Reasoning;Sequencing;Organizing;Decision  Making;Initiating Reasoning: Impaired Reasoning Impairment: Verbal complex;Functional complex Sequencing: Impaired Sequencing Impairment: Verbal complex;Functional complex Organizing: Impaired Organizing Impairment: Verbal complex;Functional complex Decision Making: Impaired Decision Making Impairment: Verbal complex;Functional complex Initiating: Impaired Initiating Impairment: Verbal complex;Functional complex Safety/Judgment: Appears intact Brief Interview for Mental Status (BIMS) Repetition of Three Words (First Attempt): 3 Temporal Orientation: Year: No answer Temporal Orientation: Month: Missed by more than 1 month Temporal Orientation:  Day: Incorrect Recall: "Sock": No, could not recall Recall: "Blue": Yes, after cueing ("a color") Recall: "Bed": No, could not recall BIMS Summary Score: 4 Sensation Sensation Light Touch: Appears Intact Proprioception: Appears Intact Additional Comments: Pt with difficulty with word finding and unable to specify where on legs therapist was touching Coordination Gross Motor Movements are Fluid and Coordinated: No Fine Motor Movements are Fluid and Coordinated: No Coordination and Movement Description: generalized deconditioning, decreased postural control, hypometria Finger Nose Finger Test: dysmetria bilaterally and required cues to follow instructions; increased time to execite motor commands Heel Shin Test: slow and deliberate in available ROM bilaterally Motor  Motor Motor: Abnormal postural alignment and control Motor - Skilled Clinical Observations: improved coordination, however remains slow and deliberate Motor - Discharge Observations: improved coordination, however remains slow and deliberate Mobility  Bed Mobility Bed Mobility: Rolling Right;Rolling Left;Supine to Sit Rolling Right: Supervision/verbal cueing Rolling Left: Supervision/Verbal cueing Supine to Sit: Supervision/Verbal cueing;Independent with assistive device Sit  to Supine: Supervision/Verbal cueing;Independent with assistive device Transfers Sit to Stand: Supervision/Verbal cueing Stand to Sit: Supervision/Verbal cueing  Trunk/Postural Assessment  Cervical Assessment Cervical Assessment: Within Functional Limits Thoracic Assessment Thoracic Assessment: Exceptions to [facility] (shoulders rounded) Thoracic AROM Overall Thoracic AROM Comments: Stiffness Lumbar Assessment Lumbar Assessment: Exceptions to [facility] (post tilt) Lumbar AROM Overall Lumbar AROM Comments: Stiffness Postural Control Postural Control: Deficits on evaluation Trunk Control: inital posterior lean when standing Righting Reactions: delayed, but improved since eval Postural Limitations: forward head, posterior pelvis  Balance Balance Static Sitting Balance Static Sitting - Balance Support: Feet supported;No upper extremity supported Static Sitting - Level of Assistance: 7: Independent Dynamic Sitting Balance Dynamic Sitting - Balance Support: Feet supported;No upper extremity supported Dynamic Sitting - Level of Assistance: 7: Independent Dynamic Sitting - Balance Activities: Forward lean/weight shifting Sitting balance - Comments: min guard A for steadying in sitting. Static Standing Balance Static Standing - Balance Support: No upper extremity supported;During functional activity Static Standing - Level of Assistance: 5: Stand by assistance Dynamic Standing Balance Dynamic Standing - Balance Support: During functional activity;No upper extremity supported Dynamic Standing - Level of Assistance: 5: Stand by assistance;4: Min assist Extremity/Trunk Assessment RUE Assessment RUE Assessment: Exceptions to [facility] General Strength Comments: [place] 3/5 LUE Assessment LUE Assessment: Exceptions to [facility] General Strength Comments: grossly 3/5   Valma Cava 09/26/2021, 10:34 AM

## 2021-09-26 NOTE — Plan of Care (Signed)
  Problem: RH Balance Goal: LTG Patient will maintain dynamic standing with ADLs (OT) Description: LTG:  Patient will maintain dynamic standing balance with assist during activities of daily living (OT)  Outcome: Completed/Met   Problem: RH Grooming Goal: LTG Patient will perform grooming w/assist,cues/equip (OT) Description: LTG: Patient will perform grooming with assist, with/without cues using equipment (OT) Outcome: Completed/Met   Problem: RH Bathing Goal: LTG Patient will bathe all body parts with assist levels (OT) Description: LTG: Patient will bathe all body parts with assist levels (OT) Outcome: Completed/Met   Problem: RH Dressing Goal: LTG Patient will perform upper body dressing (OT) Description: LTG Patient will perform upper body dressing with assist, with/without cues (OT). Outcome: Completed/Met Goal: LTG Patient will perform lower body dressing w/assist (OT) Description: LTG: Patient will perform lower body dressing with assist, with/without cues in positioning using equipment (OT) Outcome: Completed/Met   Problem: RH Toileting Goal: LTG Patient will perform toileting task (3/3 steps) with assistance level (OT) Description: LTG: Patient will perform toileting task (3/3 steps) with assistance level (OT)  Outcome: Completed/Met   Problem: RH Functional Use of Upper Extremity Goal: LTG Patient will use RT/LT upper extremity as a (OT) Description: LTG: Patient will use right/left upper extremity as a stabilizer/gross assist/diminished/nondominant/dominant level with assist, with/without cues during functional activity (OT) Outcome: Completed/Met

## 2021-09-26 NOTE — Progress Notes (Addendum)
Attempted to call report to [facility] x2, no answer from RN and unable to leave voicemail.

## 2021-09-26 NOTE — Plan of Care (Signed)
  Problem: Consults Goal: RH GENERAL PATIENT EDUCATION Description: See Patient Education module for education specifics. Outcome: Progressing Goal: Skin Care Protocol Initiated - if Braden Score 18 or less Description: If consults are not indicated, leave blank or document N/A Outcome: Progressing Goal: Diabetes Guidelines if Diabetic/Glucose > 140 Description: If diabetic or lab glucose is > 140 mg/dl - Initiate Diabetes/Hyperglycemia Guidelines & Document Interventions  Outcome: Progressing   Problem: RH BOWEL ELIMINATION Goal: RH STG MANAGE BOWEL WITH ASSISTANCE Description: STG Manage Bowel with Supervision Assistance. Outcome: Progressing Goal: RH STG MANAGE BOWEL W/MEDICATION W/ASSISTANCE Description: STG Manage Bowel with Medication with Supervision Assistance. Outcome: Progressing   Problem: RH BLADDER ELIMINATION Goal: RH STG MANAGE BLADDER WITH ASSISTANCE Description: STG Manage Bladder With Supervision Assistance Outcome: Progressing Goal: RH STG MANAGE BLADDER WITH MEDICATION WITH ASSISTANCE Description: STG Manage Bladder With Medication With Supervision Assistance. Outcome: Progressing   Problem: RH SKIN INTEGRITY Goal: RH STG MAINTAIN SKIN INTEGRITY WITH ASSISTANCE Description: STG Maintain Skin Integrity With Supervision Assistance. Outcome: Progressing Goal: RH STG ABLE TO PERFORM INCISION/WOUND CARE W/ASSISTANCE Description: STG Able To Perform Incision/Wound Care With Supervision Assistance. Outcome: Progressing   Problem: RH SAFETY Goal: RH STG ADHERE TO SAFETY PRECAUTIONS W/ASSISTANCE/DEVICE Description: STG Adhere to Safety Precautions With Cues and Reminders. Outcome: Progressing   Problem: RH KNOWLEDGE DEFICIT GENERAL Goal: RH STG INCREASE KNOWLEDGE OF SELF CARE AFTER HOSPITALIZATION Description: Patient and caregivers will demonstrate knowledge of self-care management, bowel/bladder management, skin/wound care with educational materials and handouts  provided by staff with supervision assistance at discharge. Outcome: Progressing

## 2021-09-27 DIAGNOSIS — D508 Other iron deficiency anemias: Secondary | ICD-10-CM | POA: Diagnosis not present

## 2021-09-27 DIAGNOSIS — J690 Pneumonitis due to inhalation of food and vomit: Secondary | ICD-10-CM | POA: Diagnosis not present

## 2021-09-27 DIAGNOSIS — N1832 Chronic kidney disease, stage 3b: Secondary | ICD-10-CM | POA: Diagnosis not present

## 2021-09-27 DIAGNOSIS — M6281 Muscle weakness (generalized): Secondary | ICD-10-CM | POA: Diagnosis not present

## 2021-09-27 DIAGNOSIS — E1122 Type 2 diabetes mellitus with diabetic chronic kidney disease: Secondary | ICD-10-CM | POA: Diagnosis not present

## 2021-09-27 DIAGNOSIS — K219 Gastro-esophageal reflux disease without esophagitis: Secondary | ICD-10-CM | POA: Diagnosis not present

## 2021-09-28 DIAGNOSIS — K219 Gastro-esophageal reflux disease without esophagitis: Secondary | ICD-10-CM | POA: Diagnosis not present

## 2021-09-28 DIAGNOSIS — E785 Hyperlipidemia, unspecified: Secondary | ICD-10-CM | POA: Diagnosis not present

## 2021-09-28 DIAGNOSIS — N1832 Chronic kidney disease, stage 3b: Secondary | ICD-10-CM | POA: Diagnosis not present

## 2021-09-28 DIAGNOSIS — R001 Bradycardia, unspecified: Secondary | ICD-10-CM | POA: Diagnosis not present

## 2021-09-28 DIAGNOSIS — G9349 Other encephalopathy: Secondary | ICD-10-CM | POA: Diagnosis not present

## 2021-09-28 DIAGNOSIS — M6281 Muscle weakness (generalized): Secondary | ICD-10-CM | POA: Diagnosis not present

## 2021-09-28 DIAGNOSIS — I9589 Other hypotension: Secondary | ICD-10-CM | POA: Diagnosis not present

## 2021-09-28 DIAGNOSIS — D508 Other iron deficiency anemias: Secondary | ICD-10-CM | POA: Diagnosis not present

## 2021-09-28 DIAGNOSIS — E1122 Type 2 diabetes mellitus with diabetic chronic kidney disease: Secondary | ICD-10-CM | POA: Diagnosis not present

## 2021-09-28 DIAGNOSIS — J690 Pneumonitis due to inhalation of food and vomit: Secondary | ICD-10-CM | POA: Diagnosis not present

## 2021-09-29 ENCOUNTER — Telehealth: Payer: Self-pay

## 2021-09-29 NOTE — Telephone Encounter (Signed)
SW left message informing the MOST form was left behind and has been mailed out the PO Box address in the computer.   Case closed.   Loralee Pacas, MSW, [place] Office: [phone] Cell: [phone] Fax: [phone]

## 2021-10-07 DIAGNOSIS — J690 Pneumonitis due to inhalation of food and vomit: Secondary | ICD-10-CM | POA: Diagnosis not present

## 2021-10-07 DIAGNOSIS — E1122 Type 2 diabetes mellitus with diabetic chronic kidney disease: Secondary | ICD-10-CM | POA: Diagnosis not present

## 2021-10-07 DIAGNOSIS — N1832 Chronic kidney disease, stage 3b: Secondary | ICD-10-CM | POA: Diagnosis not present

## 2021-10-07 DIAGNOSIS — D508 Other iron deficiency anemias: Secondary | ICD-10-CM | POA: Diagnosis not present

## 2021-10-07 DIAGNOSIS — M6281 Muscle weakness (generalized): Secondary | ICD-10-CM | POA: Diagnosis not present

## 2021-10-07 DIAGNOSIS — K219 Gastro-esophageal reflux disease without esophagitis: Secondary | ICD-10-CM | POA: Diagnosis not present

## 2021-10-11 DIAGNOSIS — N1832 Chronic kidney disease, stage 3b: Secondary | ICD-10-CM | POA: Diagnosis not present

## 2021-10-11 DIAGNOSIS — D508 Other iron deficiency anemias: Secondary | ICD-10-CM | POA: Diagnosis not present

## 2021-10-11 DIAGNOSIS — M6281 Muscle weakness (generalized): Secondary | ICD-10-CM | POA: Diagnosis not present

## 2021-10-11 DIAGNOSIS — K219 Gastro-esophageal reflux disease without esophagitis: Secondary | ICD-10-CM | POA: Diagnosis not present

## 2021-10-11 DIAGNOSIS — E1122 Type 2 diabetes mellitus with diabetic chronic kidney disease: Secondary | ICD-10-CM | POA: Diagnosis not present

## 2021-10-11 DIAGNOSIS — J690 Pneumonitis due to inhalation of food and vomit: Secondary | ICD-10-CM | POA: Diagnosis not present

## 2021-10-27 DIAGNOSIS — M6281 Muscle weakness (generalized): Secondary | ICD-10-CM | POA: Diagnosis not present

## 2021-10-27 DIAGNOSIS — E1122 Type 2 diabetes mellitus with diabetic chronic kidney disease: Secondary | ICD-10-CM | POA: Diagnosis not present

## 2021-10-27 DIAGNOSIS — J690 Pneumonitis due to inhalation of food and vomit: Secondary | ICD-10-CM | POA: Diagnosis not present

## 2021-10-27 DIAGNOSIS — D508 Other iron deficiency anemias: Secondary | ICD-10-CM | POA: Diagnosis not present

## 2021-10-27 DIAGNOSIS — K219 Gastro-esophageal reflux disease without esophagitis: Secondary | ICD-10-CM | POA: Diagnosis not present

## 2021-10-27 DIAGNOSIS — N1832 Chronic kidney disease, stage 3b: Secondary | ICD-10-CM | POA: Diagnosis not present

## 2021-10-31 ENCOUNTER — Telehealth: Payer: Self-pay | Admitting: Oncology

## 2021-10-31 NOTE — Telephone Encounter (Signed)
Patient sister called to cancel appointment. Patient is in rehab. She will call to reschedule when he gets out.

## 2021-11-02 ENCOUNTER — Other Ambulatory Visit: Payer: Medicare Other

## 2021-11-02 ENCOUNTER — Ambulatory Visit: Payer: Medicare Other | Admitting: Oncology

## 2021-11-04 ENCOUNTER — Inpatient Hospital Stay (HOSPITAL_COMMUNITY)
Admission: EM | Admit: 2021-11-04 | Discharge: 2021-11-11 | DRG: 871 | Disposition: A | Discharge status: Skilled Nursing Facility | Payer: Medicare Other | Source: Skilled Nursing Facility | Attending: Internal Medicine | Admitting: Internal Medicine

## 2021-11-04 ENCOUNTER — Emergency Department (HOSPITAL_COMMUNITY): Payer: Medicare Other

## 2021-11-04 ENCOUNTER — Encounter (HOSPITAL_COMMUNITY): Payer: Self-pay

## 2021-11-04 ENCOUNTER — Ambulatory Visit: Payer: Medicare Other | Admitting: Oncology

## 2021-11-04 ENCOUNTER — Inpatient Hospital Stay (HOSPITAL_COMMUNITY): Payer: Medicare Other

## 2021-11-04 ENCOUNTER — Other Ambulatory Visit: Payer: Self-pay

## 2021-11-04 DIAGNOSIS — N179 Acute kidney failure, unspecified: Secondary | ICD-10-CM | POA: Diagnosis not present

## 2021-11-04 DIAGNOSIS — R911 Solitary pulmonary nodule: Secondary | ICD-10-CM | POA: Diagnosis not present

## 2021-11-04 DIAGNOSIS — E875 Hyperkalemia: Secondary | ICD-10-CM | POA: Diagnosis not present

## 2021-11-04 DIAGNOSIS — Z931 Gastrostomy status: Secondary | ICD-10-CM | POA: Diagnosis not present

## 2021-11-04 DIAGNOSIS — F79 Unspecified intellectual disabilities: Secondary | ICD-10-CM | POA: Diagnosis present

## 2021-11-04 DIAGNOSIS — A419 Sepsis, unspecified organism: Principal | ICD-10-CM | POA: Diagnosis present

## 2021-11-04 DIAGNOSIS — I959 Hypotension, unspecified: Secondary | ICD-10-CM | POA: Diagnosis not present

## 2021-11-04 DIAGNOSIS — Z66 Do not resuscitate: Secondary | ICD-10-CM | POA: Diagnosis present

## 2021-11-04 DIAGNOSIS — R6889 Other general symptoms and signs: Secondary | ICD-10-CM | POA: Diagnosis not present

## 2021-11-04 DIAGNOSIS — Z841 Family history of disorders of kidney and ureter: Secondary | ICD-10-CM | POA: Diagnosis not present

## 2021-11-04 DIAGNOSIS — R680 Hypothermia, not associated with low environmental temperature: Secondary | ICD-10-CM | POA: Diagnosis not present

## 2021-11-04 DIAGNOSIS — G4730 Sleep apnea, unspecified: Secondary | ICD-10-CM | POA: Diagnosis present

## 2021-11-04 DIAGNOSIS — I129 Hypertensive chronic kidney disease with stage 1 through stage 4 chronic kidney disease, or unspecified chronic kidney disease: Secondary | ICD-10-CM | POA: Diagnosis present

## 2021-11-04 DIAGNOSIS — R001 Bradycardia, unspecified: Secondary | ICD-10-CM | POA: Diagnosis present

## 2021-11-04 DIAGNOSIS — E876 Hypokalemia: Secondary | ICD-10-CM | POA: Diagnosis not present

## 2021-11-04 DIAGNOSIS — Z8616 Personal history of COVID-19: Secondary | ICD-10-CM

## 2021-11-04 DIAGNOSIS — M6281 Muscle weakness (generalized): Secondary | ICD-10-CM | POA: Diagnosis not present

## 2021-11-04 DIAGNOSIS — I10 Essential (primary) hypertension: Secondary | ICD-10-CM | POA: Diagnosis not present

## 2021-11-04 DIAGNOSIS — E1169 Type 2 diabetes mellitus with other specified complication: Secondary | ICD-10-CM | POA: Diagnosis not present

## 2021-11-04 DIAGNOSIS — R6520 Severe sepsis without septic shock: Secondary | ICD-10-CM | POA: Diagnosis not present

## 2021-11-04 DIAGNOSIS — R1312 Dysphagia, oropharyngeal phase: Secondary | ICD-10-CM | POA: Diagnosis not present

## 2021-11-04 DIAGNOSIS — Z79899 Other long term (current) drug therapy: Secondary | ICD-10-CM | POA: Diagnosis not present

## 2021-11-04 DIAGNOSIS — Z431 Encounter for attention to gastrostomy: Secondary | ICD-10-CM | POA: Diagnosis not present

## 2021-11-04 DIAGNOSIS — Z515 Encounter for palliative care: Secondary | ICD-10-CM | POA: Diagnosis not present

## 2021-11-04 DIAGNOSIS — J9811 Atelectasis: Secondary | ICD-10-CM | POA: Diagnosis present

## 2021-11-04 DIAGNOSIS — D638 Anemia in other chronic diseases classified elsewhere: Secondary | ICD-10-CM | POA: Diagnosis present

## 2021-11-04 DIAGNOSIS — J690 Pneumonitis due to inhalation of food and vomit: Secondary | ICD-10-CM | POA: Diagnosis not present

## 2021-11-04 DIAGNOSIS — D61818 Other pancytopenia: Secondary | ICD-10-CM | POA: Diagnosis not present

## 2021-11-04 DIAGNOSIS — D696 Thrombocytopenia, unspecified: Secondary | ICD-10-CM | POA: Diagnosis not present

## 2021-11-04 DIAGNOSIS — Z7189 Other specified counseling: Secondary | ICD-10-CM | POA: Diagnosis not present

## 2021-11-04 DIAGNOSIS — T68XXXA Hypothermia, initial encounter: Secondary | ICD-10-CM | POA: Diagnosis not present

## 2021-11-04 DIAGNOSIS — R2681 Unsteadiness on feet: Secondary | ICD-10-CM | POA: Diagnosis not present

## 2021-11-04 DIAGNOSIS — Z7401 Bed confinement status: Secondary | ICD-10-CM | POA: Diagnosis not present

## 2021-11-04 DIAGNOSIS — R42 Dizziness and giddiness: Secondary | ICD-10-CM | POA: Diagnosis not present

## 2021-11-04 DIAGNOSIS — N182 Chronic kidney disease, stage 2 (mild): Secondary | ICD-10-CM | POA: Diagnosis present

## 2021-11-04 DIAGNOSIS — D72819 Decreased white blood cell count, unspecified: Secondary | ICD-10-CM | POA: Diagnosis present

## 2021-11-04 DIAGNOSIS — R1310 Dysphagia, unspecified: Secondary | ICD-10-CM | POA: Diagnosis not present

## 2021-11-04 DIAGNOSIS — J9809 Other diseases of bronchus, not elsewhere classified: Secondary | ICD-10-CM | POA: Diagnosis not present

## 2021-11-04 DIAGNOSIS — E785 Hyperlipidemia, unspecified: Secondary | ICD-10-CM | POA: Diagnosis present

## 2021-11-04 DIAGNOSIS — G9340 Encephalopathy, unspecified: Secondary | ICD-10-CM | POA: Diagnosis not present

## 2021-11-04 LAB — URINALYSIS, ROUTINE W REFLEX MICROSCOPIC
Bilirubin Urine: NEGATIVE
Glucose, UA: NEGATIVE mg/dL
Hgb urine dipstick: NEGATIVE
Ketones, ur: NEGATIVE mg/dL
Leukocytes,Ua: NEGATIVE
Nitrite: NEGATIVE
Protein, ur: 100 mg/dL — AB
Specific Gravity, Urine: 1.011 (ref 1.005–1.030)
pH: 5 (ref 5.0–8.0)

## 2021-11-04 LAB — CBG MONITORING, ED
Glucose-Capillary: 98 mg/dL (ref 70–99)
Glucose-Capillary: 100 mg/dL — ABNORMAL HIGH (ref 70–99)

## 2021-11-04 LAB — COMPREHENSIVE METABOLIC PANEL
ALT: 23 U/L (ref 0–44)
AST: 29 U/L (ref 15–41)
Albumin: 3.1 g/dL — ABNORMAL LOW (ref 3.5–5.0)
Alkaline Phosphatase: 151 U/L — ABNORMAL HIGH (ref 38–126)
Anion gap: 8 (ref 5–15)
BUN: 25 mg/dL — ABNORMAL HIGH (ref 8–23)
CO2: 27 mmol/L (ref 22–32)
Calcium: 9.4 mg/dL (ref 8.9–10.3)
Chloride: 103 mmol/L (ref 98–111)
Creatinine, Ser: 1.2 mg/dL (ref 0.61–1.24)
GFR, Estimated: >60 mL/min (ref >60)
Glucose, Bld: 121 mg/dL — ABNORMAL HIGH (ref 70–99)
Potassium: 4.7 mmol/L (ref 3.5–5.1)
Sodium: 138 mmol/L (ref 135–145)
Total Bilirubin: 0.6 mg/dL (ref 0.3–1.2)
Total Protein: 6.9 g/dL (ref 6.5–8.1)

## 2021-11-04 LAB — CBC WITH DIFFERENTIAL/PLATELET
Abs Immature Granulocytes: 0.01 10*3/uL (ref 0.00–0.07)
Basophils Absolute: 0 10*3/uL (ref 0.0–0.1)
Basophils Relative: 0 %
Eosinophils Absolute: 0 10*3/uL (ref 0.0–0.5)
Eosinophils Relative: 0 %
HCT: 33.1 % — ABNORMAL LOW (ref 39.0–52.0)
Hemoglobin: 10.9 g/dL — ABNORMAL LOW (ref 13.0–17.0)
Immature Granulocytes: 0 %
Lymphocytes Relative: 19 %
Lymphs Abs: 0.7 10*3/uL (ref 0.7–4.0)
MCH: 31.3 pg (ref 26.0–34.0)
MCHC: 32.9 g/dL (ref 30.0–36.0)
MCV: 95.1 fL (ref 80.0–100.0)
Monocytes Absolute: 0.1 10*3/uL (ref 0.1–1.0)
Monocytes Relative: 4 %
Neutro Abs: 2.9 10*3/uL (ref 1.7–7.7)
Neutrophils Relative %: 77 %
Platelets: 141 10*3/uL — ABNORMAL LOW (ref 150–400)
RBC: 3.48 MIL/uL — ABNORMAL LOW (ref 4.22–5.81)
RDW: 16.8 % — ABNORMAL HIGH (ref 11.5–15.5)
WBC: 3.8 10*3/uL — ABNORMAL LOW (ref 4.0–10.5)
nRBC: 0 % (ref 0.0–0.2)

## 2021-11-04 LAB — CORTISOL-PM, BLOOD: Cortisol - PM: 21.6 ug/dL — ABNORMAL HIGH (ref <10.0)

## 2021-11-04 LAB — APTT: aPTT: 42 seconds — ABNORMAL HIGH (ref 24–36)

## 2021-11-04 LAB — LACTIC ACID, PLASMA: Lactic Acid, Venous: 1.8 mmol/L (ref 0.5–1.9)

## 2021-11-04 LAB — PROTIME-INR
INR: 0.9 (ref 0.8–1.2)
Prothrombin Time: 12.5 seconds (ref 11.4–15.2)

## 2021-11-04 MED ORDER — VANCOMYCIN HCL 1750 MG/350ML IV SOLN
1750.0000 mg | Freq: Once | INTRAVENOUS | Status: DC
  Filled 2021-11-04: qty 350

## 2021-11-04 MED ORDER — SODIUM CHLORIDE 0.9 % IV SOLN
3.0000 g | Freq: Four times a day (QID) | INTRAVENOUS | Status: AC
  Administered 2021-11-04 – 2021-11-09 (×20): 3 g via INTRAVENOUS
  Filled 2021-11-04 (×20): qty 8

## 2021-11-04 MED ORDER — SODIUM CHLORIDE 0.9 % IV SOLN: 2.0000 g | Freq: Once | INTRAVENOUS | Status: DC

## 2021-11-04 MED ORDER — GUAIFENESIN ER 600 MG PO TB12
1200.0000 mg | ORAL_TABLET | Freq: Two times a day (BID) | ORAL | Status: DC
  Administered 2021-11-06 – 2021-11-11 (×11): 1200 mg via ORAL
  Filled 2021-11-04 (×12): qty 2

## 2021-11-04 MED ORDER — ENOXAPARIN SODIUM 40 MG/0.4ML IJ SOSY
40.0000 mg | PREFILLED_SYRINGE | INTRAMUSCULAR | Status: DC
  Administered 2021-11-04 – 2021-11-11 (×7): 40 mg via SUBCUTANEOUS
  Filled 2021-11-04 (×7): qty 0.4

## 2021-11-04 MED ORDER — LACTATED RINGERS IV BOLUS
500.0000 mL | Freq: Once | INTRAVENOUS | Status: AC
  Administered 2021-11-05: 500 mL via INTRAVENOUS

## 2021-11-04 MED ORDER — LACTATED RINGERS IV BOLUS
1000.0000 mL | Freq: Once | INTRAVENOUS | Status: AC
  Administered 2021-11-04: 1000 mL via INTRAVENOUS

## 2021-11-04 MED ORDER — SODIUM CHLORIDE 0.9 % IV SOLN
INTRAVENOUS | Status: AC
Start: 2021-11-04 — End: 2021-11-05

## 2021-11-04 NOTE — ED Notes (Signed)
Off bair hugger  temp temp foley 98.2

## 2021-11-04 NOTE — H&P (Signed)
History and Physical    Daniel Bailey. NKN:[id number] DOB: [birth year] DOA: 11/04/2021  PCP: [name], MD (Confirm with patient/family/NH records and if not entered, this has to be entered at [facility] point of entry) Patient coming from: SNF  I have personally briefly reviewed patient's old medical records in [place]  Chief Complaint: Patient was found to have worsening of cough and hypothermia  HPI: Daniel Bailey. is a 71 y.o. male with medical history significant of intellectual disability, recurrent aspiration pneumonia, oropharyngeal dysphagia on pured diet, anemia secondary to chronic illness, sent from nursing home for worsening of hypothermia and cough.  Patient unable to provide history, or history provided by POA at bedside.  Patient had first episode of aspiration pneumonia in March and then second episode in May 2023.  He was evaluated by speech therapist and was considered to have severe oropharyngeal dysphagia and diet downgraded to pured.  Family started noticed patient " cold to touch" yesterday, and put more clothes on him overnight.  When, nursing home staff found patient lethargic and vital check found patient was hypothermic and bradycardia.  No significant hypoxia no significant hypotensive or hypoxic.  ED Course: Hypothermic temperature 88, bear hugger started in the ED.  Chest x-ray persistent right lower lobe infiltrates.  Review of Systems: Unable to perform, baseline mentation impairment.  Past Medical History:  Diagnosis Date   Anemia    Elevated PSA    Frequent urination    Glaucoma    History of COVID-19    Hyperglycemia    Hyperlipidemia    Hypertension    Sleep apnea     Past Surgical History:  Procedure Laterality Date   COLONOSCOPY     PROSTATE BIOPSY N/A 11/20/2017   Procedure: BIOPSY TRANSRECTAL ULTRASONIC PROSTATE (TUBP);  Surgeon: Abbie Sons, MD;  Location: [facility] ORS;  Service: Urology;  Laterality: N/A;     reports that he has  never smoked. He has never used smokeless tobacco. He reports that he does not drink alcohol and does not use drugs.  No Known Allergies  Family History  Problem Relation Age of Onset   Colon cancer Mother    Kidney disease Mother    Cancer Father        unknown     Prior to Admission medications   Medication Sig Start Date End Date Taking? Authorizing Provider  acetaminophen (TYLENOL) 500 MG tablet Take 1,000 mg by mouth every 8 (eight) hours as needed for moderate pain.   Yes [provider]  ferrous sulfate 325 (65 FE) MG tablet Take 1 tablet (325 mg total) by mouth daily with breakfast. 09/24/21  Yes Angiulli, Lavon Paganini, PA-C  midodrine (PROAMATINE) 5 MG tablet Take 1 tablet (5 mg total) by mouth 3 (three) times daily with meals. Patient taking differently: Take 5 mg by mouth daily. 08/29/21  Yes Tower, Wynelle Fanny, MD  pantoprazole (PROTONIX) 40 MG tablet Take 1 tablet (40 mg total) by mouth daily. 07/19/21  Yes Modena Jansky, MD    Physical Exam: Vitals:   11/04/21 1330 11/04/21 1400 11/04/21 1430 11/04/21 1500  BP: 122/78 117/83 118/73 106/64  Pulse: (!) 51 (!) 47 (!) 51 (!) 50  Resp: '10 14 13 14  '$ Temp:      TempSrc:      SpO2: 97% 99% 97% 97%  Weight:      Height:        Constitutional: NAD, calm, comfortable Vitals:   11/04/21  1330 11/04/21 1400 11/04/21 1430 11/04/21 1500  BP: 122/78 117/83 118/73 106/64  Pulse: (!) 51 (!) 47 (!) 51 (!) 50  Resp: '10 14 13 14  '$ Temp:      TempSrc:      SpO2: 97% 99% 97% 97%  Weight:      Height:       Eyes: PERRL, lids and conjunctivae normal ENMT: Mucous membranes are moist. Posterior pharynx clear of any exudate or lesions.Normal dentition.  Neck: normal, supple, no masses, no thyromegaly Respiratory: Diminished breathing sound on right lower field, no wheezing, no crackles. Normal respiratory effort. No accessory muscle use.  Cardiovascular: Regular rate and rhythm, no murmurs / rubs / gallops. No extremity edema. 2+  pedal pulses. No carotid bruits.  Abdomen: no tenderness, no masses palpated. No hepatosplenomegaly. Bowel sounds positive.  Musculoskeletal: no clubbing / cyanosis. No joint deformity upper and lower extremities. Good ROM, no contractures. Normal muscle tone.  Skin: no rashes, lesions, ulcers. No induration Neurologic: No facial droops, moving all limbs, following simple commands Psychiatric: Awake, calm, mentation at baseline   Labs on Admission: I have personally reviewed following labs and imaging studies  CBC: Recent Labs  Lab 11/04/21 1340  WBC 3.8*  NEUTROABS 2.9  HGB 10.9*  HCT 33.1*  MCV 95.1  PLT 030*   Basic Metabolic Panel: Recent Labs  Lab 11/04/21 1340  NA 138  K 4.7  CL 103  CO2 27  GLUCOSE 121*  BUN 25*  CREATININE 1.20  CALCIUM 9.4   GFR: Estimated Creatinine Clearance: 58.4 mL/min (by C-G formula based on SCr of 1.2 mg/dL). Liver Function Tests: Recent Labs  Lab 11/04/21 1340  AST 29  ALT 23  ALKPHOS 151*  BILITOT 0.6  PROT 6.9  ALBUMIN 3.1*   No results for input(s): "LIPASE", "AMYLASE" in the last 168 hours. No results for input(s): "AMMONIA" in the last 168 hours. Coagulation Profile: Recent Labs  Lab 11/04/21 1340  INR 0.9   Cardiac Enzymes: No results for input(s): "CKTOTAL", "CKMB", "CKMBINDEX", "TROPONINI" in the last 168 hours. BNP (last 3 results) No results for input(s): "PROBNP" in the last 8760 hours. HbA1C: No results for input(s): "HGBA1C" in the last 72 hours. CBG: No results for input(s): "GLUCAP" in the last 168 hours. Lipid Profile: No results for input(s): "CHOL", "HDL", "LDLCALC", "TRIG", "CHOLHDL", "LDLDIRECT" in the last 72 hours. Thyroid Function Tests: No results for input(s): "TSH", "T4TOTAL", "FREET4", "T3FREE", "THYROIDAB" in the last 72 hours. Anemia Panel: No results for input(s): "VITAMINB12", "FOLATE", "FERRITIN", "TIBC", "IRON", "RETICCTPCT" in the last 72 hours. Urine analysis:    Component Value  Date/Time   COLORURINE YELLOW 09/05/2021 1813   APPEARANCEUR CLEAR 09/05/2021 1813   LABSPEC 1.012 09/05/2021 1813   PHURINE 6.0 09/05/2021 1813   GLUCOSEU NEGATIVE 09/05/2021 1813   HGBUR NEGATIVE 09/05/2021 1813   BILIRUBINUR NEGATIVE 09/05/2021 1813   BILIRUBINUR neg 08/10/2021 1616   KETONESUR NEGATIVE 09/05/2021 1813   PROTEINUR 30 (A) 09/05/2021 1813   UROBILINOGEN 0.2 08/10/2021 1616   NITRITE NEGATIVE 09/05/2021 1813   LEUKOCYTESUR NEGATIVE 09/05/2021 1813    Radiological Exams on Admission: DG Chest Port 1 View  Result Date: 11/04/2021 CLINICAL DATA:  Sepsis. EXAM: PORTABLE CHEST 1 VIEW COMPARISON:  09/14/2021; 09/02/2021 FINDINGS: Grossly unchanged cardiac silhouette and mediastinal contours. Unchanged bibasilar heterogeneous opacities. Redemonstrated biapical pleuroparenchymal thickening. No new focal airspace opacities. No pleural effusion or pneumothorax. No evidence of edema. No acute osseous abnormalities. IMPRESSION: Similar appearing bibasilar atelectasis without  superimposed acute cardiopulmonary disease on this AP portable examination. Further evaluation with a PA and lateral chest radiograph may be obtained as clinically indicated. Electronically Signed   By: Sandi Mariscal M.D.   On: 11/04/2021 14:06    EKG: Independently reviewed.  Sinus bradycardia  Assessment/Plan Principal Problem:   Hypothermia Active Problems:   Aspiration pneumonia ([place])  (please populate well all problems here in Problem List. (For example, if patient is on BP meds at home and you resume or decide to hold them, it is a problem that needs to be her. Same for CAD, COPD, HLD and so on)  SIRS -He has hypothermia, decreased WBC count, with suspected infection source aspiration pneumonia. -N.p.o. for now, discussed with POA/guardian at bedside, given this is his third time patient developed aspiration pneumonia, I proposed n.p.o. in the future and feeding tube to avoid further recurrent aspiration  pneumonia, guardian/POA declined saying that " eating food is the only pleasure patient has in life" -Patient is DNR. -Given the recurrent RLL PNA, raised concern about complications such as abscess formation, will do CT chest without contrast to further characterize. -HOB 30 degrees, aspiration precautions -N.p.o. for now, speech evaluation tomorrow. -IVF -Flutter valve if possible   RLL aspiration pneumonia -Downgrade antibiotics to Unasyn.  Hypothermia -Continue Bair hugger -TSH within normal limits on last admission, will check cortisol level.  Chronic oropharyngeal dysphagia -Poorly controlled but appears to be at baseline -P.o. reported patient appears to be the same weight and usually able to complete pured diet 100%.  Anemia, chronic intellectual impairment -Stable.  DVT prophylaxis: Lovenox Code Status: DNR Family Communication: Legal guardian at bedside Disposition Plan: Patient sick with recurrent aspiration pneumonia, requiring IV antibiotics, expect more than 2 midnight hospital stay. Consults called: None Admission status: Telemetry admission  Lequita Halt MD [facility] Pager [phone]  11/04/2021, 4:41 PM

## 2021-11-04 NOTE — ED Provider Notes (Signed)
Care of the patient was assumed from [facility] at 55; see this 32 note for complete history of present illness, review of systems, and physical exam.    Briefly, the patient is a 71 y.o. male with a history of recurrent aspiration pneumonia and intellectual disability who presented to the ED with several days of generalized malaise.  Found to be newly hypothermic to 88.4 on rectal temperature, currently on Bair hugger.  Has a known history of bradycardia with associated first-degree heart block.  Lab work largely reassuring aside from mild bump in white count.  Plan at time of handoff:  -Follow-up urine studies and admit  MDM/ED Course: On my initial exam, the patient was well-appearing and asymptomatic.  He is currently on a [employer] and tolerating this well.  Satting appropriately on room air.  I reviewed the patient's lab work and chest x-ray.  On my read, chest x-ray appears to have a subtle increase in airspace opacities in the right lung base when compared with prior chest x-ray in May 2023.  Legal guardian at bedside does state that patient has had increased productive cough over the last several days thus some concern for likely recurrent aspiration pneumonia.  In the setting of hypothermia, will initiate empiric IV antibiotics due to concern for sepsis.  Temp Foley ordered.  I spoke with the inpatient medicine team who is agreeable to accept the patient to their service.  Plan of care was discussed with the patient and legal guardian at bedside who verbalized understanding.  No further acute events or interventions prior to transition of care to the inpatient team.  Patient seen in conjunction with the attending physician, Welford Roche, MD, who participated in all aspects of the patient's care and was in agreement with the above plan.   Emergency Department Medication Summary: Medications  vancomycin (VANCOREADY) IVPB 1750 mg/350 mL (has no administration in time range)  ceFEPIme  (MAXIPIME) 2 g in sodium chloride 0.9 % 100 mL IVPB (has no administration in time range)  lactated ringers bolus 1,000 mL (has no administration in time range)   Clinical Impression: 1. Hypothermia, initial encounter       Akiem Urieta, [place], MD 11/04/21 1612    Luna Fuse, MD 11/05/21 [phone]

## 2021-11-04 NOTE — ED Triage Notes (Signed)
BIB EMS from [facility] for generalized malaise and brady.  Patient has no complaints.  According to vital records from facillity this  rate is patients norm.

## 2021-11-04 NOTE — ED Provider Notes (Signed)
[facility] EMERGENCY DEPARTMENT Provider Note   CSN: [id number] Arrival date & time: 11/04/21  1312     History  Chief Complaint  Patient presents with   Fatigue    Daniel Meneely. is a 71 y.o. male.  71 year old male brought in by EMS from [facility] where patient reported he just did not feel well, did not feel like himself today.  Patient was bradycardic at the facility however this is baseline for him.  He arrives in the emergency room with a rectal temperature of 88.4, sepsis undifferentiated orders are initiated and patient is placed into the bear hugger.  Patient's guardian is at bedside, notes history of aspiration, has been coughing, unsure if changed from normal, is on a pured diet.  Also history of hyperlipidemia, hypertension, hyperglycemia and anemia.  History of intellectual disability, reported to be at baseline per guardian. Specifically, denies chest pain, abdominal pain, fevers, vomiting, difficulty breathing.       Home Medications Prior to Admission medications   Medication Sig Start Date End Date Taking? Authorizing Provider  acetaminophen (TYLENOL) 325 MG tablet Take 2 tablets (650 mg total) by mouth every 6 (six) hours as needed for mild pain (or Fever >/= 101). 09/07/21   Aline August, MD  ferrous sulfate 325 (65 FE) MG tablet Take 1 tablet (325 mg total) by mouth daily with breakfast. 09/24/21   Angiulli, Lavon Paganini, PA-C  midodrine (PROAMATINE) 5 MG tablet Take 1 tablet (5 mg total) by mouth 3 (three) times daily with meals. 08/29/21   Tower, Wynelle Fanny, MD  pantoprazole (PROTONIX) 40 MG tablet Take 1 tablet (40 mg total) by mouth daily. 07/19/21   Hongalgi, Lenis Dickinson, MD      Allergies    Patient has no known allergies.    Review of Systems   Review of Systems Level 5 caveat for difficult historian. Physical Exam Updated Vital Signs BP 106/64   Pulse (!) 50   Temp (!) 88.4 F (31.3 C) (Rectal) Comment (Src): unable to obtain oral going  to get rectal  Resp 14   Ht '5\' 6"'$  (1.676 m)   Wt 87.1 kg   SpO2 97%   BMI 30.99 kg/m  Physical Exam Vitals and nursing note reviewed.  Constitutional:      General: He is not in acute distress.    Appearance: He is well-developed. He is not diaphoretic.  HENT:     Head: Normocephalic and atraumatic.     Mouth/Throat:     Mouth: Mucous membranes are moist.  Eyes:     Conjunctiva/sclera: Conjunctivae normal.  Cardiovascular:     Rate and Rhythm: Regular rhythm. Bradycardia present.     Pulses: Normal pulses.     Heart sounds: Normal heart sounds.  Pulmonary:     Effort: Pulmonary effort is normal.     Breath sounds: Normal breath sounds.  Abdominal:     Palpations: Abdomen is soft.     Tenderness: There is no abdominal tenderness.  Musculoskeletal:     Cervical back: Neck supple.     Right lower leg: Edema present.     Left lower leg: Edema present.  Skin:    General: Skin is warm and dry.     Findings: No erythema or rash.  Neurological:     Mental Status: He is alert. Mental status is at baseline.  Psychiatric:        Behavior: Behavior normal.     ED Results / Procedures /  Treatments   Labs (all labs ordered are listed, but only abnormal results are displayed) Labs Reviewed  COMPREHENSIVE METABOLIC PANEL - Abnormal; Notable for the following components:      Result Value   Glucose, Bld 121 (*)    BUN 25 (*)    Albumin 3.1 (*)    Alkaline Phosphatase 151 (*)    All other components within normal limits  CBC WITH DIFFERENTIAL/PLATELET - Abnormal; Notable for the following components:   WBC 3.8 (*)    RBC 3.48 (*)    Hemoglobin 10.9 (*)    HCT 33.1 (*)    RDW 16.8 (*)    Platelets 141 (*)    All other components within normal limits  APTT - Abnormal; Notable for the following components:   aPTT 42 (*)    All other components within normal limits  CULTURE, BLOOD (ROUTINE X 2)  CULTURE, BLOOD (ROUTINE X 2)  URINE CULTURE  LACTIC ACID, PLASMA   PROTIME-INR  LACTIC ACID, PLASMA  URINALYSIS, ROUTINE W REFLEX MICROSCOPIC  BRAIN NATRIURETIC PEPTIDE    EKG None  Radiology DG Chest Port 1 View  Result Date: 11/04/2021 CLINICAL DATA:  Sepsis. EXAM: PORTABLE CHEST 1 VIEW COMPARISON:  09/14/2021; 09/02/2021 FINDINGS: Grossly unchanged cardiac silhouette and mediastinal contours. Unchanged bibasilar heterogeneous opacities. Redemonstrated biapical pleuroparenchymal thickening. No new focal airspace opacities. No pleural effusion or pneumothorax. No evidence of edema. No acute osseous abnormalities. IMPRESSION: Similar appearing bibasilar atelectasis without superimposed acute cardiopulmonary disease on this AP portable examination. Further evaluation with a PA and lateral chest radiograph may be obtained as clinically indicated. Electronically Signed   By: Sandi Mariscal M.D.   On: 11/04/2021 14:06    Procedures Procedures    Medications Ordered in ED Medications - No data to display  ED Course/ Medical Decision Making/ A&P                           Medical Decision Making Amount and/or Complexity of Data Reviewed Labs: ordered. Radiology: ordered. ECG/medicine tests: ordered.   This patient presents to the ED for concern of not feeling well, this involves an extensive number of treatment options, and is a complaint that carries with it a high risk of complications and morbidity.  The differential diagnosis includes aspiration/pna, UTI, sepsis, electrolyte disturbance, arrhythmia,    Co morbidities that complicate the patient evaluation  Electro disability, diabetes, hyperlipidemia, hypertension, OSA, aspiration PNA   Additional history obtained:  Additional history obtained from guardian at bedside, notes at baseline mental status  External records from outside source obtained and reviewed including echo from 08/26/2021 shows EF of 60 to 65%, obtained for history of aortic valve disease, arrhythmia: RBBB, bradycardia, 1st  degree AV block Discharge summary from 09/26/2021 reviewed, admitted for acute encephalopathy   Lab Tests:  I Ordered, and personally interpreted labs.  The pertinent results include: CBC with white count 3.8, slight improvement from prior on file.  CMP with glucose of 121.  INR normal.  Urinalysis, lactic acid pending at time of signout.   Imaging Studies ordered:  I ordered imaging studies including chest x-ray I independently visualized and interpreted imaging which showed unchanged from prior, read by radiology with similar appearing bibasilar atelectasis without superimposed acute cardiopulmonary disease I agree with the radiologist interpretation   Problem List / ED Course / Critical interventions / Medication management  71 year old male brought in by EMS from rehab facility for complaint  of not feeling well today. Intellectual disability complicates history however patient specifically denies SHOB, CP, abdominal pain. He has a cough and notes it is bothersome, guardian at bedside states recurrent problem due to aspiration. He is found to be hypothermia at 88.4 rectal and is placed under the [facility]. Sepsis orders initiated. O2 sat normal on room air, HR 40-50s which is baseline for patient. He does have lower extremity pitting edema. Equal arm and leg strength.  I have reviewed the patients home medicines and have made adjustments as needed   Social Determinants of Health:  Living at rehab facility post admission, here with guardian.    Test / Admission - Considered:  Likely admit for hypothermia, pending remaining lab results, care signed out at change of shift.         Final Clinical Impression(s) / ED Diagnoses Final diagnoses:  Hypothermia, initial encounter    Rx / DC Orders ED Discharge Orders     None         Tacy Learn, PA-C 84/69/62 [id number]    Lianne Cure, DO 41/32/44 [phone]

## 2021-11-04 NOTE — Progress Notes (Signed)
HOSPITAL MEDICINE OVERNIGHT EVENT NOTE    Notified by nursing that patient has been exhibiting recurrent bouts of substantial hypotension with systolic blood pressures between the 80s and 90s since approximately 6:30 PM.  Patient is currently being treated for suspected aspiration pneumonia, currently on intravenous Unasyn.  Bair hugger has been discontinued in attempt to improve blood pressures however hypotension still persists.  Will administer a 500 cc LR bolus now and reassess.  We will switch bed request from medical telemetry to progressive.  Continue to monitor patient closely.  Vernelle Emerald  MD [facility]

## 2021-11-04 NOTE — Progress Notes (Signed)
Pharmacy Antibiotic Note  Daniel Bailey. is a 71 y.o. male for which pharmacy has been consulted for unasyn dosing for  aspiration pna .  Patient with a history of intellectual disability, recurrent asp pna, oropharyngeal dysphagia, anemia. Patient presenting with hypothermia and cough.  SCr 1.2 - near baseline WBC 3.8; LA 1.8; T 88.4 >> 97.4; HR 50>87; RR 14>16  Plan: Unasyn 3g q6h Trend WBC, Fever, Renal function, & Clinical course F/u cultures, clinical course, WBC, fever De-escalate when able  Height: '5\' 6"'$  (167.6 cm) Weight: 87.1 kg (192 lb) IBW/kg (Calculated) : 63.8  Temp (24hrs), Avg:88.4 F (31.3 C), Min:88.4 F (31.3 C), Max:88.4 F (31.3 C)  Recent Labs  Lab 11/04/21 1329 11/04/21 1340  WBC  --  3.8*  CREATININE  --  1.20  LATICACIDVEN 1.8  --     Estimated Creatinine Clearance: 58.4 mL/min (by C-G formula based on SCr of 1.2 mg/dL).    No Known Allergies  Antimicrobials this admission: unasyn 6/30 >>   Microbiology results: Pending  Thank you for allowing pharmacy to be a part of this patient's care.  Lorelei Pont, PharmD, BCPS 11/04/2021 4:33 PM ED Clinical Pharmacist -  [phone]

## 2021-11-05 ENCOUNTER — Inpatient Hospital Stay (HOSPITAL_COMMUNITY): Payer: Medicare Other

## 2021-11-05 DIAGNOSIS — T68XXXA Hypothermia, initial encounter: Secondary | ICD-10-CM | POA: Diagnosis not present

## 2021-11-05 LAB — BASIC METABOLIC PANEL
Anion gap: 11 (ref 5–15)
BUN: 26 mg/dL — ABNORMAL HIGH (ref 8–23)
CO2: 23 mmol/L (ref 22–32)
Calcium: 8.4 mg/dL — ABNORMAL LOW (ref 8.9–10.3)
Chloride: 107 mmol/L (ref 98–111)
Creatinine, Ser: 1.6 mg/dL — ABNORMAL HIGH (ref 0.61–1.24)
GFR, Estimated: 46 mL/min — ABNORMAL LOW (ref >60)
Glucose, Bld: 84 mg/dL (ref 70–99)
Potassium: 5.4 mmol/L — ABNORMAL HIGH (ref 3.5–5.1)
Sodium: 141 mmol/L (ref 135–145)

## 2021-11-05 LAB — CBC
HCT: 26 % — ABNORMAL LOW (ref 39.0–52.0)
Hemoglobin: 8.5 g/dL — ABNORMAL LOW (ref 13.0–17.0)
MCH: 30.9 pg (ref 26.0–34.0)
MCHC: 32.7 g/dL (ref 30.0–36.0)
MCV: 94.5 fL (ref 80.0–100.0)
Platelets: 133 10*3/uL — ABNORMAL LOW (ref 150–400)
RBC: 2.75 MIL/uL — ABNORMAL LOW (ref 4.22–5.81)
RDW: 17.1 % — ABNORMAL HIGH (ref 11.5–15.5)
WBC: 3.7 10*3/uL — ABNORMAL LOW (ref 4.0–10.5)
nRBC: 0 % (ref 0.0–0.2)

## 2021-11-05 LAB — LACTIC ACID, PLASMA: Lactic Acid, Venous: 0.9 mmol/L (ref 0.5–1.9)

## 2021-11-05 LAB — BRAIN NATRIURETIC PEPTIDE: B Natriuretic Peptide: 28.1 pg/mL (ref 0.0–100.0)

## 2021-11-05 MED ORDER — FERROUS SULFATE 325 (65 FE) MG PO TABS
325.0000 mg | ORAL_TABLET | Freq: Every day | ORAL | Status: DC
  Administered 2021-11-06 – 2021-11-11 (×6): 325 mg via ORAL
  Filled 2021-11-05 (×6): qty 1

## 2021-11-05 MED ORDER — MIDODRINE HCL 5 MG PO TABS
5.0000 mg | ORAL_TABLET | Freq: Three times a day (TID) | ORAL | Status: DC
  Administered 2021-11-05 – 2021-11-11 (×17): 5 mg via ORAL
  Filled 2021-11-05 (×17): qty 1

## 2021-11-05 MED ORDER — PANTOPRAZOLE SODIUM 40 MG PO TBEC
40.0000 mg | DELAYED_RELEASE_TABLET | Freq: Every day | ORAL | Status: DC
  Administered 2021-11-05 – 2021-11-11 (×7): 40 mg via ORAL
  Filled 2021-11-05 (×7): qty 1

## 2021-11-05 MED ORDER — SODIUM ZIRCONIUM CYCLOSILICATE 5 G PO PACK
5.0000 g | PACK | Freq: Once | ORAL | Status: AC
  Administered 2021-11-05: 5 g via ORAL
  Filled 2021-11-05: qty 1

## 2021-11-05 MED ORDER — FOOD THICKENER (SIMPLYTHICK): 5.0000 | ORAL | Status: DC | PRN

## 2021-11-05 NOTE — Progress Notes (Signed)
PROGRESS NOTE  Daniel Bailey.  NOM:[id number] DOB: 26-May-1950 DOA: 11/04/2021 PCP: [name], MD   Brief Narrative: Patient is a 71 year old male with history of intellectual disability, recurrent aspiration pneumonia, oropharyngeal dysphagia , on pured diet at the skilled nursing facility, anemia who was sent here for the evaluation of hypothermia, cough.  Patient was unable to provide history.  On reviewing his previous records, it was found that he had multiple episodes of aspiration pneumonia.  Patient was felt cold, lethargic, hypothermic, bradycardic.  On presentation he was hypothermic with temperature of 88.  Chest x-ray showed right lower lobe infiltrates.  Patient was admitted for the management of sepsis secondary to aspiration pneumonia.  Assessment & Plan:  Principal Problem:   Hypothermia Active Problems:   Aspiration pneumonia ([place])   Sepsis secondary to aspiration pneumonia: Presented with hypothermia, leukopenia.  Follow-up cultures. Started on Unasyn.  Currently on clear liquid diet awaiting speech therapy evaluation.  He has known history of oropharyngeal dysphagia/aspiration pneumonia and was on pured diet. The chest x-ray showed right lower lobe infiltrates, CT chest did not show any pneumonia.  CT chest showed  primarily lower lobe, bronchial wall thickening with adjacent hazy and reticular opacities, consistent with bronchitis and associated atelectasis. 4 mm sub solid nodules, left upper lobe and right lower lobe superior segment, not evident on the prior study. Non-contrast chest CT at 3-6 months is recommended.  Currently he is not in any respiratory distress, on room air  AKI on CkD stage 2 /hyperkalemia: Creatinine bumped up to 1.6 today.  On reviewing his previous records, his baseline creatinine is around 1.3.-1.4 .  Will stop further IV fluids.  Ultrasound of the kidneys was normal.  Check BMP tomorrow  Hypothermia: Put on Bair hugger.  Currently  temperature normal .  Thyroid function test is normal.  Normocytic anemia/thrombocytopenia: Hemoglobin dropped to 8.5 most likely secondary to hemodilution.  On reviewing his previous records, his platelets has been fluctuating around 140-250.  Chronic intellectual impairment: Continue supportive care  Goals of care: CODE STATUS DNR.  Lives at a skilled nursing facility.  TOC consulted.  Very deconditioned, poor quality of life with recurrent aspiration.  We will consider palliative care evaluation if she declines further            DVT prophylaxis:enoxaparin (LOVENOX) injection 40 mg Start: 11/04/21 1645     Code Status: DNR  Family Communication: None at the bedside  Patient status:Inpatient  Patient is from :SNF  Anticipated discharge to:SNF  Estimated DC date: 1 to 2 days   Consultants: None  Procedures:None  Antimicrobials:  Anti-infectives (From admission, onward)    Start     Dose/Rate Route Frequency Ordered Stop   11/04/21 1645  Ampicillin-Sulbactam (UNASYN) 3 g in sodium chloride 0.9 % 100 mL IVPB        3 g 200 mL/hr over 30 Minutes Intravenous Every 6 hours 11/04/21 1632     11/04/21 1600  vancomycin (VANCOREADY) IVPB 1750 mg/350 mL  Status:  Discontinued        1,750 mg 175 mL/hr over 120 Minutes Intravenous  Once 11/04/21 1548 11/04/21 1628   11/04/21 1600  ceFEPIme (MAXIPIME) 2 g in sodium chloride 0.9 % 100 mL IVPB  Status:  Discontinued        2 g 200 mL/hr over 30 Minutes Intravenous  Once 11/04/21 1548 11/04/21 1628       Subjective: Patient seen and examined at the bedside this morning.  Very weak and deconditioned.  Hemodynamically stable.  Alert and awake and answers questions by nodding his head but does not speak.  Objective: Vitals:   11/05/21 0300 11/05/21 0400 11/05/21 0500 11/05/21 0600  BP: (!) 85/66 92/61 (!) 87/61 (!) 115/53  Pulse: 99 99 95 93  Resp: '19 19 18 20  '$ Temp:  97.9 F (36.6 C)    TempSrc:  Oral    SpO2: 94% 93%  92% 93%  Weight:  88.9 kg    Height:        Intake/Output Summary (Last 24 hours) at 11/05/2021 0745 Last data filed at 11/05/2021 0544 Gross per 24 hour  Intake 2517.99 ml  Output 950 ml  Net 1567.99 ml   Filed Weights   11/04/21 1322 11/05/21 0103 11/05/21 0400  Weight: 87.1 kg 88.9 kg 88.9 kg    Examination:  General exam: Very deconditioned, chronically looking HEENT: PERRL, facial droop Respiratory system:  no wheezes or crackles  Cardiovascular system: S1 & S2 heard, RRR.  Gastrointestinal system: Abdomen is nondistended, soft and nontender.  Umbilical hernia Central nervous system: Alert and awake but not oriented Extremities: trace lower extremity edema, no clubbing ,no cyanosis Skin: No rashes, no ulcers,no icterus   GU: Foley   Data Reviewed: I have personally reviewed following labs and imaging studies  CBC: Recent Labs  Lab 11/04/21 1340 11/05/21 0258  WBC 3.8* 3.7*  NEUTROABS 2.9  --   HGB 10.9* 8.5*  HCT 33.1* 26.0*  MCV 95.1 94.5  PLT 141* 875*   Basic Metabolic Panel: Recent Labs  Lab 11/04/21 1340 11/05/21 0258  NA 138 141  K 4.7 5.4*  CL 103 107  CO2 27 23  GLUCOSE 121* 84  BUN 25* 26*  CREATININE 1.20 1.60*  CALCIUM 9.4 8.4*     No results found for this or any previous visit (from the past 240 hour(s)).   Radiology Studies: CT CHEST WO CONTRAST  Result Date: 11/04/2021 CLINICAL DATA:  Fatigue.  Complicated pneumonia suspected. EXAM: CT CHEST WITHOUT CONTRAST TECHNIQUE: Multidetector CT imaging of the chest was performed following the standard protocol without IV contrast. RADIATION DOSE REDUCTION: This exam was performed according to the departmental dose-optimization program which includes automated exposure control, adjustment of the mA and/or kV according to patient size and/or use of iterative reconstruction technique. COMPARISON:  Current and prior chest radiographs. Prior chest CT dated 07/12/2021. FINDINGS: Cardiovascular: Heart  normal in size and configuration. No pericardial effusion or coronary artery calcifications. Mild descending thoracic aorta atherosclerotic calcifications. Mediastinum/Nodes: No enlarged mediastinal or axillary lymph nodes. Thyroid gland, trachea, and esophagus demonstrate no significant findings. Lungs/Pleura: Ill-defined sub solid nodule, 4 mm, left upper lobe, image 65, series 8. Similar appearing and similar-sized sub solid nodule in the superior segment of the right lower lobe, image 74, series 8. These were not present on the prior chest, abdomen and pelvis CT. Bronchial wall thickening with adjacent peribronchovascular reticular and hazy opacities in both lower lobes, and minimally at the base of the left upper lobe lingula. Remainder of the lungs is clear. No pleural effusion or pneumothorax. Upper Abdomen: No acute abnormality. Musculoskeletal: No fracture or acute finding. No bone lesion. No chest wall mass. Right-sided gynecomastia. IMPRESSION: 1. No evidence of pneumonia. 2. Lung base, primarily lower lobe, bronchial wall thickening with adjacent hazy and reticular opacities. Findings consistent with bronchitis and associated atelectasis. Bronchitis may be acute or chronic. Consolidation noted in base of the left lower lobe on the prior  CT has resolved. 3. 4 mm sub solid nodules, left upper lobe and right lower lobe superior segment, not evident on the prior study. Non-contrast chest CT at 3-6 months is recommended. If nodules persist and are stable at that time, consider additional non-contrast chest CT examinations at 2 and 4 years. This recommendation follows the consensus statement: Guidelines for Management of Incidental Pulmonary Nodules Detected on CT Images: From the Fleischner Society 2017; Radiology 2017; 284:228-243. 4. Mild aortic atherosclerosis. Aortic Atherosclerosis (ICD10-I70.0). Electronically Signed   By: Lajean Manes M.D.   On: 11/04/2021 18:14   DG Chest Port 1 View  Result  Date: 11/04/2021 CLINICAL DATA:  Sepsis. EXAM: PORTABLE CHEST 1 VIEW COMPARISON:  09/14/2021; 09/02/2021 FINDINGS: Grossly unchanged cardiac silhouette and mediastinal contours. Unchanged bibasilar heterogeneous opacities. Redemonstrated biapical pleuroparenchymal thickening. No new focal airspace opacities. No pleural effusion or pneumothorax. No evidence of edema. No acute osseous abnormalities. IMPRESSION: Similar appearing bibasilar atelectasis without superimposed acute cardiopulmonary disease on this AP portable examination. Further evaluation with a PA and lateral chest radiograph may be obtained as clinically indicated. Electronically Signed   By: Sandi Mariscal M.D.   On: 11/04/2021 14:06    Scheduled Meds:  enoxaparin (LOVENOX) injection  40 mg Subcutaneous Q24H   guaiFENesin  1,200 mg Oral BID   Continuous Infusions:  sodium chloride 125 mL/hr at 11/05/21 0544   ampicillin-sulbactam (UNASYN) IV Stopped (11/05/21 0227)     LOS: 1 day   Shelly Coss, MD [facility] P7/05/2021, 7:45 AM

## 2021-11-05 NOTE — Evaluation (Signed)
Clinical/Bedside Swallow Evaluation Patient Details  Name: Daniel Bailey. MRN: [id number] Date of Birth: 10-Dec-1950  Today's Date: 11/05/2021 Time: SLP Start Time (ACUTE ONLY): 1950 SLP Stop Time (ACUTE ONLY): 1012 SLP Time Calculation (min) (ACUTE ONLY): 20 min  Past Medical History:  Past Medical History:  Diagnosis Date   Anemia    Elevated PSA    Frequent urination    Glaucoma    History of COVID-19    Hyperglycemia    Hyperlipidemia    Hypertension    Sleep apnea    Past Surgical History:  Past Surgical History:  Procedure Laterality Date   COLONOSCOPY     PROSTATE BIOPSY N/A 11/20/2017   Procedure: BIOPSY TRANSRECTAL ULTRASONIC PROSTATE (TUBP);  Surgeon: Abbie Sons, MD;  Location: [facility] ORS;  Service: Urology;  Laterality: N/A;   HPI:  [name]. is a 71 y.o. 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 Went to [facility] inpt rehab in May, was D/Cd on a dysphagia 1 diet with nectar thick liquids. MBS 09/06/21: "moderate to severe oropharyngeal dysphagia with delayed oral transit decreased bolus cohesion, lingual and palatal residue as well as significantly decreased strength of mid/lower pharyngeal constrictors with vallecular and pyriform sinus residue with all textures that spill into the airway post swallow. Pt aspirates purees and solids as well. Pt only senses larger volume aspiration. Attempted to cue pt to clear throat and swallow again. With max cueing he was able to do this, but with low effort and weakness. Perhaps with training pt could improve, but aspiration risk will be high with all textures."    Assessment / Plan / Recommendation  Clinical Impression  [name]'s swallow function appears to be consistent with prior documentation from May  of this year.  Today he is sleepy; followed commands intermittently; speaking minimally.  He demonstrated intermittent coughing with nectar liquids and pureed foods. There was oral holding of purees due to his drowsiness, so ultimately applesauce had to be suctioned from his mouth.   He continues to demonstrate dysphagia with probable aspiration - per prior SLP notes and Dr. Delories Heinz H&P from the ED, POA prefers to continue PO diet given that Mr. Erdahl still derives pleasure from eating. Recommend resuming his prior diet of dysphagia 1/puree with nectar-thick liquids, but hold tray when he is obviously too sleepy to eat.  General precautions posted at [facility]> SLP will follow for ongoing education and to determine with POA how restrictive his diet should be.   SLP Visit Diagnosis: Dysphagia, oropharyngeal phase (R13.12)    Aspiration Risk  Severe aspiration risk    Diet Recommendation   Dysphagia 1/nectar thick  Medication Administration: Crushed with puree    Other  Recommendations Oral Care Recommendations: Oral care BID    Recommendations for follow up therapy are one component of a multi-disciplinary discharge planning process, led by the attending physician.  Recommendations may be updated based on patient status, additional functional criteria and insurance authorization.  Follow up Recommendations        Assistance Recommended at Discharge Frequent or constant Supervision/Assistance  Functional Status Assessment Patient has had a recent decline in their functional status and demonstrates the ability to make significant improvements in function in a reasonable and predictable amount of time.  Frequency and Duration min 2x/week  2 weeks  Prognosis        Swallow Study   General HPI: Daniel Bailey. is a 71 y.o. male with medical history significant  intellectual disability, recurrent aspiration pneumonia, oropharyngeal dysphagia with aspiration of all consistencies, including  solids, anemia secondary to chronic illness, sent from nursing home for worsening of hypothermia and cough. Went to [facility] inpt rehab in May, was D/Cd on a dysphagia 1 diet with nectar thick liquids. MBS 09/06/21: "moderate to severe oropharyngeal dysphagia with delayed oral transit decreased bolus cohesion, lingual and palatal residue as well as significantly decreased strength of mid/lower pharyngeal constrictors with vallecular and pyriform sinus residue with all textures that spill into the airway post swallow. Pt aspirates purees and solids as well. Pt only senses larger volume aspiration. Attempted to cue pt to clear throat and swallow again. With max cueing he was able to do this, but with low effort and weakness. Perhaps with training pt could improve, but aspiration risk will be high with all textures." Type of Study: Bedside Swallow Evaluation Previous Swallow Assessment: see HPI Diet Prior to this Study: Thin liquids Temperature Spikes Noted: No Respiratory Status: Room air History of Recent Intubation: No Behavior/Cognition: Lethargic/Drowsy Oral Cavity Assessment: Within Functional Limits Oral Care Completed by SLP: Recent completion by staff Oral Cavity - Dentition: Edentulous Self-Feeding Abilities: Needs assist Patient Positioning: Upright in bed Baseline Vocal Quality: Normal Volitional Cough: Cognitively unable to elicit Volitional Swallow: Unable to elicit    Oral/Motor/Sensory Function Overall Oral Motor/Sensory Function: Within functional limits   Ice Chips Ice chips: Not tested   Thin Liquid Thin Liquid: Not tested    Nectar Thick Nectar Thick Liquid: Impaired Presentation: Spoon Oral phase functional implications: Oral holding Pharyngeal Phase Impairments: Suspected delayed Swallow;Cough - Immediate   Honey Thick Honey Thick Liquid: Not tested   Puree Puree: Impaired Presentation: Spoon Oral Phase Functional Implications: Oral holding;Oral residue Pharyngeal Phase  Impairments: Suspected delayed Swallow;Cough - Delayed   Solid     Solid: Not tested      Daniel Bailey 11/05/2021,10:22 AM  Estill Bamberg L. Tivis Ringer, MA CCC/SLP Clinical Specialist - [place] Office number [phone]

## 2021-11-05 NOTE — Progress Notes (Signed)
Spoke with [name] , pts cousin. She has a lot of questions regarding tube feeding placement and nutrition.  All questions answered and agreeable for PEG tube placement. And wants to talk to MD regarding this.

## 2021-11-05 NOTE — ED Notes (Signed)
Report given to glenda rn  no one on the floor knew how to place the purple man

## 2021-11-06 DIAGNOSIS — T68XXXA Hypothermia, initial encounter: Secondary | ICD-10-CM | POA: Diagnosis not present

## 2021-11-06 LAB — BASIC METABOLIC PANEL
Anion gap: 10 (ref 5–15)
BUN: 26 mg/dL — ABNORMAL HIGH (ref 8–23)
CO2: 23 mmol/L (ref 22–32)
Calcium: 8.3 mg/dL — ABNORMAL LOW (ref 8.9–10.3)
Chloride: 109 mmol/L (ref 98–111)
Creatinine, Ser: 1.86 mg/dL — ABNORMAL HIGH (ref 0.61–1.24)
GFR, Estimated: 38 mL/min — ABNORMAL LOW (ref >60)
Glucose, Bld: 103 mg/dL — ABNORMAL HIGH (ref 70–99)
Potassium: 4.2 mmol/L (ref 3.5–5.1)
Sodium: 142 mmol/L (ref 135–145)

## 2021-11-06 LAB — CBC
HCT: 24.7 % — ABNORMAL LOW (ref 39.0–52.0)
Hemoglobin: 8 g/dL — ABNORMAL LOW (ref 13.0–17.0)
MCH: 30.4 pg (ref 26.0–34.0)
MCHC: 32.4 g/dL (ref 30.0–36.0)
MCV: 93.9 fL (ref 80.0–100.0)
Platelets: 113 10*3/uL — ABNORMAL LOW (ref 150–400)
RBC: 2.63 MIL/uL — ABNORMAL LOW (ref 4.22–5.81)
RDW: 17.1 % — ABNORMAL HIGH (ref 11.5–15.5)
WBC: 10.2 10*3/uL (ref 4.0–10.5)
nRBC: 0 % (ref 0.0–0.2)

## 2021-11-06 LAB — SODIUM, URINE, RANDOM: Sodium, Ur: 123 mmol/L

## 2021-11-06 MED ORDER — SODIUM CHLORIDE 0.9 % IV SOLN: INTRAVENOUS | Status: DC

## 2021-11-06 NOTE — Progress Notes (Addendum)
PROGRESS NOTE  Daniel Bailey.  WER:[id number] DOB: 1951/04/19 DOA: 11/04/2021 PCP: [name], MD   Brief Narrative: Patient is a 71 year old male with history of intellectual disability, recurrent aspiration pneumonia, oropharyngeal dysphagia , on pured diet at the skilled nursing facility, anemia who was sent here for the evaluation of hypothermia, cough.  Patient was unable to provide history.  On reviewing his previous records, it was found that he had multiple episodes of aspiration pneumonia.  Patient was felt cold, lethargic, hypothermic, bradycardic.  On presentation he was hypothermic with temperature of 88.  Chest x-ray showed right lower lobe infiltrates.  Patient was admitted for the management of sepsis secondary to aspiration pneumonia.  Assessment & Plan:  Principal Problem:   Hypothermia Active Problems:   Aspiration pneumonia ([place])   Sepsis secondary to aspiration pneumonia: Presented with hypothermia, leukopenia.  Cultures have not shown any growth till date. Started on Unasyn.  Speech therapy evaluated him and is started on dysphagia 1 diet.   He has known history of oropharyngeal dysphagia/aspiration pneumonia and was on pured diet. The chest x-ray showed right lower lobe infiltrates, CT chest did not show any pneumonia.  CT chest showed  primarily lower lobe, bronchial wall thickening with adjacent hazy and reticular opacities, consistent with bronchitis and associated atelectasis. 4 mm sub solid nodules, left upper lobe and right lower lobe superior segment, not evident on the prior study. Non-contrast chest CT at 3-6 months is recommended.  Currently he is not in any respiratory distress, on room air. Family interested in PEG placement, IR consulted.  Agree with this plan.  AKI on CkD stage 2 /hyperkalemia: Creatinine bumped up to 1.8 today.  On reviewing his previous records, his baseline creatinine is around 1.3.-1.4 .  Started on IV fluids again.  Check urine  sodium ultrasound of the kidneys was normal.  Check BMP tomorrow  Hypothermia: Put on Bair hugger.  Currently temperature normal .  Thyroid function test is normal.  Normocytic anemia/thrombocytopenia: Hemoglobin dropped to 8 most likely secondary to hemodilution.  On reviewing his previous records, his platelets has been fluctuating around 140-250.  Chronic intellectual impairment: Continue supportive care  Goals of care: CODE STATUS DNR.  Lives at a skilled nursing facility.  TOC consulted.  Very deconditioned, poor quality of life with recurrent aspiration.             DVT prophylaxis:enoxaparin (LOVENOX) injection 40 mg Start: 11/04/21 1645     Code Status: DNR  Family Communication: Called and discussed with legal guardian [name] on 7/2  Patient status:Inpatient  Patient is from :SNF  Anticipated discharge to:SNF  Estimated DC date: after PEG decision   Consultants: None  Procedures:None  Antimicrobials:  Anti-infectives (From admission, onward)    Start     Dose/Rate Route Frequency Ordered Stop   11/04/21 1645  Ampicillin-Sulbactam (UNASYN) 3 g in sodium chloride 0.9 % 100 mL IVPB        3 g 200 mL/hr over 30 Minutes Intravenous Every 6 hours 11/04/21 1632     11/04/21 1600  vancomycin (VANCOREADY) IVPB 1750 mg/350 mL  Status:  Discontinued        1,750 mg 175 mL/hr over 120 Minutes Intravenous  Once 11/04/21 1548 11/04/21 1628   11/04/21 1600  ceFEPIme (MAXIPIME) 2 g in sodium chloride 0.9 % 100 mL IVPB  Status:  Discontinued        2 g 200 mL/hr over 30 Minutes Intravenous  Once 11/04/21 1548 11/04/21  1628       Subjective: Patient seen and examined at the bedside this morning.  Hemodynamically stable.  He looks much much better today.  Alert, awake, denies complaints.  Knows that he is in the hospital.  Not in any respite distress.  Room air.   Objective: Vitals:   11/05/21 1933 11/05/21 2354 11/06/21 0353 11/06/21 0900  BP: (!) 90/50 (!) 91/52  97/63 104/68  Pulse: 91 90 82 69  Resp: (!) '21 20 20 20  '$ Temp: 98.1 F (36.7 C) 98.1 F (36.7 C) 97.9 F (36.6 C) (!) 97.2 F (36.2 C)  TempSrc: Oral Oral Oral Oral  SpO2: 92% 91% 93% 99%  Weight:   88.5 kg   Height:        Intake/Output Summary (Last 24 hours) at 11/06/2021 1103 Last data filed at 11/06/2021 0900 Gross per 24 hour  Intake 1080 ml  Output 625 ml  Net 455 ml   Filed Weights   11/05/21 0103 11/05/21 0400 11/06/21 0353  Weight: 88.9 kg 88.9 kg 88.5 kg    Examination:  General exam: Overall comfortable, not in distress, deconditioned, chronically looking HEENT: PERRL Respiratory system:  no wheezes or crackles  Cardiovascular system: S1 & S2 heard, RRR.  Gastrointestinal system: Abdomen is nondistended, soft and nontender. Central nervous system: Alert and awake, oriented to place Extremities: No edema, no clubbing ,no cyanosis Skin: No rashes, no ulcers,no icterus     Data Reviewed: I have personally reviewed following labs and imaging studies  CBC: Recent Labs  Lab 11/04/21 1340 11/05/21 0258 11/06/21 0444  WBC 3.8* 3.7* 10.2  NEUTROABS 2.9  --   --   HGB 10.9* 8.5* 8.0*  HCT 33.1* 26.0* 24.7*  MCV 95.1 94.5 93.9  PLT 141* 133* 161*   Basic Metabolic Panel: Recent Labs  Lab 11/04/21 1340 11/05/21 0258 11/06/21 0444  NA 138 141 142  K 4.7 5.4* 4.2  CL 103 107 109  CO2 '27 23 23  '$ GLUCOSE 121* 84 103*  BUN 25* 26* 26*  CREATININE 1.20 1.60* 1.86*  CALCIUM 9.4 8.4* 8.3*     Recent Results (from the past 240 hour(s))  Urine Culture     Status: None (Preliminary result)   Collection Time: 11/04/21  1:29 PM   Specimen: In/Out Cath Urine  Result Value Ref Range Status   Specimen Description IN/OUT CATH URINE  Final   Special Requests NONE  Final   Culture   Final    CULTURE REINCUBATED FOR BETTER GROWTH Performed at [facility], [place] [address], [place], [place] [address]    Report Status PENDING  Incomplete  Blood Culture  (routine x 2)     Status: None (Preliminary result)   Collection Time: 11/04/21  1:40 PM   Specimen: BLOOD  Result Value Ref Range Status   Specimen Description BLOOD BLOOD RIGHT WRIST  Final   Special Requests   Final    BOTTLES DRAWN AEROBIC AND ANAEROBIC Blood Culture adequate volume   Culture   Final    NO GROWTH 2 DAYS Performed at [facility], [place] [address], [place], [place] [address]    Report Status PENDING  Incomplete  Blood Culture (routine x 2)     Status: None (Preliminary result)   Collection Time: 11/04/21  1:53 PM   Specimen: BLOOD  Result Value Ref Range Status   Specimen Description BLOOD LEFT ANTECUBITAL  Final   Special Requests   Final  BOTTLES DRAWN AEROBIC AND ANAEROBIC Blood Culture adequate volume   Culture   Final    NO GROWTH 2 DAYS Performed at [facility], [place] [address] [place], [place] [address]    Report Status PENDING  Incomplete     Radiology Studies: US RENAL  Result Date: 11/05/2021 CLINICAL DATA:  Acute kidney injury. EXAM: RENAL / URINARY TRACT ULTRASOUND COMPLETE COMPARISON:  None Available. FINDINGS: Right Kidney: Renal measurements: 11.1 x 4.3 x 5.2 cm = volume: 122.1 mL. Echogenicity within normal limits. No mass or hydronephrosis visualized. Left Kidney: Renal measurements: 11.0 x 5.6 x 4.6 cm = volume: 142 mL. Echogenicity within normal limits. No mass or hydronephrosis visualized. Bladder: Decompressed, not visualized. Other: None. IMPRESSION: 1. Normal renal ultrasound. Electronically Signed   By: Lajean Manes M.D.   On: 11/05/2021 10:22   CT CHEST WO CONTRAST  Result Date: 11/04/2021 CLINICAL DATA:  Fatigue.  Complicated pneumonia suspected. EXAM: CT CHEST WITHOUT CONTRAST TECHNIQUE: Multidetector CT imaging of the chest was performed following the standard protocol without IV contrast. RADIATION DOSE REDUCTION: This exam was performed according to the departmental dose-optimization program which includes automated  exposure control, adjustment of the mA and/or kV according to patient size and/or use of iterative reconstruction technique. COMPARISON:  Current and prior chest radiographs. Prior chest CT dated 07/12/2021. FINDINGS: Cardiovascular: Heart normal in size and configuration. No pericardial effusion or coronary artery calcifications. Mild descending thoracic aorta atherosclerotic calcifications. Mediastinum/Nodes: No enlarged mediastinal or axillary lymph nodes. Thyroid gland, trachea, and esophagus demonstrate no significant findings. Lungs/Pleura: Ill-defined sub solid nodule, 4 mm, left upper lobe, image 65, series 8. Similar appearing and similar-sized sub solid nodule in the superior segment of the right lower lobe, image 74, series 8. These were not present on the prior chest, abdomen and pelvis CT. Bronchial wall thickening with adjacent peribronchovascular reticular and hazy opacities in both lower lobes, and minimally at the base of the left upper lobe lingula. Remainder of the lungs is clear. No pleural effusion or pneumothorax. Upper Abdomen: No acute abnormality. Musculoskeletal: No fracture or acute finding. No bone lesion. No chest wall mass. Right-sided gynecomastia. IMPRESSION: 1. No evidence of pneumonia. 2. Lung base, primarily lower lobe, bronchial wall thickening with adjacent hazy and reticular opacities. Findings consistent with bronchitis and associated atelectasis. Bronchitis may be acute or chronic. Consolidation noted in base of the left lower lobe on the prior CT has resolved. 3. 4 mm sub solid nodules, left upper lobe and right lower lobe superior segment, not evident on the prior study. Non-contrast chest CT at 3-6 months is recommended. If nodules persist and are stable at that time, consider additional non-contrast chest CT examinations at 2 and 4 years. This recommendation follows the consensus statement: Guidelines for Management of Incidental Pulmonary Nodules Detected on CT Images:  From the Fleischner Society 2017; Radiology 2017; 284:228-243. 4. Mild aortic atherosclerosis. Aortic Atherosclerosis (ICD10-I70.0). Electronically Signed   By: Lajean Manes M.D.   On: 11/04/2021 18:14   DG Chest Port 1 View  Result Date: 11/04/2021 CLINICAL DATA:  Sepsis. EXAM: PORTABLE CHEST 1 VIEW COMPARISON:  09/14/2021; 09/02/2021 FINDINGS: Grossly unchanged cardiac silhouette and mediastinal contours. Unchanged bibasilar heterogeneous opacities. Redemonstrated biapical pleuroparenchymal thickening. No new focal airspace opacities. No pleural effusion or pneumothorax. No evidence of edema. No acute osseous abnormalities. IMPRESSION: Similar appearing bibasilar atelectasis without superimposed acute cardiopulmonary disease on this AP portable examination. Further evaluation with a PA and lateral chest radiograph may be obtained  as clinically indicated. Electronically Signed   By: Sandi Mariscal M.D.   On: 11/04/2021 14:06    Scheduled Meds:  enoxaparin (LOVENOX) injection  40 mg Subcutaneous Q24H   ferrous sulfate  325 mg Oral Q breakfast   guaiFENesin  1,200 mg Oral BID   midodrine  5 mg Oral TID WC   pantoprazole  40 mg Oral Daily   Continuous Infusions:  sodium chloride     ampicillin-sulbactam (UNASYN) IV 3 g (11/06/21 0600)     LOS: 2 days   Shelly Coss, MD [facility] P7/06/2021, 11:03 AM

## 2021-11-06 NOTE — Progress Notes (Signed)
   11/06/21 1300  Assess: MEWS Score  Temp (!) 95.5 F (35.3 C)  BP (!) 94/53  MAP (mmHg) 67  Pulse Rate 85  ECG Heart Rate 85  Resp 20  SpO2 99 %  O2 Device Room Air  Assess: MEWS Score  MEWS Temp 1  MEWS Systolic 1  MEWS Pulse 0  MEWS RR 0  MEWS LOC 0  MEWS Score 2  MEWS Score Color Yellow  Assess: if the MEWS score is Yellow or Red  Were vital signs taken at a resting state? Yes  Focused Assessment No change from prior assessment  Does the patient meet 2 or more of the SIRS criteria? No  Does the patient have a confirmed or suspected source of infection? No  MEWS guidelines implemented *See Row Information* No, previously yellow, continue vital signs every 4 hours  Treat  Pain Scale 0-10  Pain Score 0  Take Vital Signs  Increase Vital Sign Frequency  Yellow: Q 2hr X 2 then Q 4hr X 2, if remains yellow, continue Q 4hrs  Notify: Charge Nurse/RN  Name of Charge Nurse/RN Notified Diana, RN  Date Charge Nurse/RN Notified 11/06/21  Time Charge Nurse/RN Notified 20  Notify: Provider  Provider Name/Title Adhikari  Date Provider Notified 11/06/21  Time Provider Notified 15  Method of Notification  (secure chat)  Notification Reason Other (Comment) (Temp)  Provider response Other (Comment) (bare hugger)  Assess: SIRS CRITERIA  SIRS Temperature  1  SIRS Pulse 0  SIRS Respirations  0  SIRS WBC 0  SIRS Score Sum  1

## 2021-11-06 NOTE — Progress Notes (Signed)
IR was requested for image guided G tube placement.   Case was reviewed by Dr. Laurence Ferrari, anatomy amendable.   Patient was hospitalized on 6/30 due to sepsis secondary to aspiration PNA.  Pt has leukocytosis but blood cx NG x 2 days, afebrile. Currently on Unasyn.   Called Miss [name] who is the legal guardian of the patient and discussed benefit, risk, and possible long term complications after G tube placement.   After thorough discussion, Miss [name] states that she will need some time to think about G tube placement.  Her main concern is that patient enjoys eating so much and she is afraid that he may not able able to eat after G tube placement, which will affect quality of patient's life significantly.   Informed Miss [name] that SLP cleared him for dysphagia 1 diet which is nectar thickness, but the patient does have high risk for aspiration and pt needs to eat/to be feed with understanding of that patient may aspirate food which can cause aspiration pneumonia, and more serious infections like sepsis.   Informed Miss [name] that the patient maybe able to eat small nectar thickness food for the pleasure even after G tube placement, but it appears that he will not be able to receive enough nutrition by eating thorough his mouth, and that is why G tube placement is recommended to the patient.  Miss [name] verbalized understanding, ask IR to call her back on Wednesday to discuss about the G tube placement again.   IR APPs will call Miss [name] on Wednesday to see if she would like IR to proceed with G tube placement.    Please call IR for questions and concerns.    Daniel Sharps H [name] PA-C 11/06/2021 1:15 PM

## 2021-11-06 NOTE — Plan of Care (Signed)
  Problem: Pain Managment: Goal: General experience of comfort will improve Outcome: Completed/Met

## 2021-11-06 NOTE — Progress Notes (Signed)
Attempted to straight cath for sodium collection. Attempted to straight cath w/ assistance but continued to meet resistance. MD notified.

## 2021-11-06 NOTE — Progress Notes (Addendum)
Temp 95.5 rectal. MD notified Bair hugger placed on pt.Will continue to monitor.

## 2021-11-07 DIAGNOSIS — T68XXXA Hypothermia, initial encounter: Secondary | ICD-10-CM | POA: Diagnosis not present

## 2021-11-07 LAB — BASIC METABOLIC PANEL
Anion gap: 8 (ref 5–15)
Anion gap: 9 (ref 5–15)
BUN: 24 mg/dL — ABNORMAL HIGH (ref 8–23)
BUN: 29 mg/dL — ABNORMAL HIGH (ref 8–23)
CO2: 24 mmol/L (ref 22–32)
CO2: 23 mmol/L (ref 22–32)
Calcium: 8.3 mg/dL — ABNORMAL LOW (ref 8.9–10.3)
Calcium: 8.4 mg/dL — ABNORMAL LOW (ref 8.9–10.3)
Chloride: 109 mmol/L (ref 98–111)
Chloride: 113 mmol/L — ABNORMAL HIGH (ref 98–111)
Creatinine, Ser: 1.64 mg/dL — ABNORMAL HIGH (ref 0.61–1.24)
Creatinine, Ser: 1.62 mg/dL — ABNORMAL HIGH (ref 0.61–1.24)
GFR, Estimated: 44 mL/min — ABNORMAL LOW (ref >60)
GFR, Estimated: 45 mL/min — ABNORMAL LOW (ref >60)
Glucose, Bld: 100 mg/dL — ABNORMAL HIGH (ref 70–99)
Glucose, Bld: 114 mg/dL — ABNORMAL HIGH (ref 70–99)
Potassium: 3.4 mmol/L — ABNORMAL LOW (ref 3.5–5.1)
Potassium: 4 mmol/L (ref 3.5–5.1)
Sodium: 141 mmol/L (ref 135–145)
Sodium: 145 mmol/L (ref 135–145)

## 2021-11-07 LAB — URINE CULTURE: Culture: >=100000 — AB

## 2021-11-07 LAB — MAGNESIUM: Magnesium: 1.9 mg/dL (ref 1.7–2.4)

## 2021-11-07 MED ORDER — POTASSIUM CHLORIDE CRYS ER 20 MEQ PO TBCR
40.0000 meq | EXTENDED_RELEASE_TABLET | Freq: Once | ORAL | Status: AC
  Administered 2021-11-07: 40 meq via ORAL
  Filled 2021-11-07: qty 2

## 2021-11-07 MED ORDER — ONDANSETRON HCL 4 MG/2ML IJ SOLN
4.0000 mg | Freq: Four times a day (QID) | INTRAMUSCULAR | Status: DC | PRN
  Administered 2021-11-07: 4 mg via INTRAVENOUS
  Filled 2021-11-07: qty 2

## 2021-11-07 NOTE — Progress Notes (Addendum)
Called received from [place] put in vfib. Heart rate in the in the 200-300 sustaining. Pt c/o of chest pain. 02 97%. Temp 98.1. bp 118/7084. MD notified. No new orders. EKG performed.

## 2021-11-07 NOTE — Progress Notes (Signed)
Pharmacy Antibiotic Note  Daniel Bailey. is a 71 y.o. male for which pharmacy has been consulted for unasyn dosing for  aspiration pna .  Today is abx day #4.  Patient with a history of intellectual disability, recurrent asp pna, oropharyngeal dysphagia, anemia. Patient presenting with hypothermia and cough.  SCr 1.64, up from baseline but improving.  CrCl remains >30 ml/min. WBC 3.8 > 10.2; T 97.3  Plan: Continue  Unasyn 3g q6h F/u cultures, clinical course, WBC, fever De-escalate when able  Height: '5\' 6"'$  (167.6 cm) Weight: 90.4 kg (199 lb 4.7 oz) IBW/kg (Calculated) : 63.8  Temp (24hrs), Avg:97.3 F (36.3 C), Min:95.5 F (35.3 C), Max:98.2 F (36.8 C)  Recent Labs  Lab 11/04/21 1329 11/04/21 1340 11/05/21 0258 11/06/21 0444 11/07/21 0758  WBC  --  3.8* 3.7* 10.2  --   CREATININE  --  1.20 1.60* 1.86* 1.64*  LATICACIDVEN 1.8  --  0.9  --   --      Estimated Creatinine Clearance: 43.5 mL/min (A) (by C-G formula based on SCr of 1.64 mg/dL (H)).    No Known Allergies  Antimicrobials this admission: unasyn 6/30 >>   Microbiology results: 6/30 BCx x 2: ngtd 6/30 UCx: E faecalis: S:Amp, NTF, Vanc  Thank you for allowing pharmacy to be a part of this patient's care.  [employer], Pharm.D., BCPS Clinical Pharmacist  **Pharmacist phone directory can be found on [email or web address] listed under [place].  11/07/2021 10:57 AM

## 2021-11-07 NOTE — TOC Initial Note (Signed)
Transition of Care (TOC) - Initial/Assessment Note    Patient Details  Name: Daniel Bailey. MRN: [id number] Date of Birth: [birth year]  Transition of [facility]) CM/SW Contact:    Bethann Berkshire, LCSW Phone Number: 11/07/2021, 4:00 PM  Clinical Narrative:                  CSW contacted [facility] SNF liaison who confirmed pt was in their Short Term rehab prior to admission. Liaison informs CSW that pt has been at facility under STR for 39 days. CSW will follow for DC coordination.   Expected Discharge Plan: Skilled Nursing Facility Barriers to Discharge: Continued Medical Work up   Patient Goals and CMS Choice        Expected Discharge Plan and Services Expected Discharge Plan: [place]                                              Prior Living Arrangements/Services                       Activities of Daily Living Home Assistive Devices/Equipment: None ADL Screening (condition at time of admission) Patient's cognitive ability adequate to safely complete daily activities?: No Is the patient deaf or have difficulty hearing?: No Does the patient have difficulty seeing, even when wearing glasses/contacts?: No Does the patient have difficulty concentrating, remembering, or making decisions?: Yes Patient able to express need for assistance with ADLs?: No Does the patient have difficulty dressing or bathing?: Yes Independently performs ADLs?: No Communication: Needs assistance Is this a change from baseline?: Pre-admission baseline Dressing (OT): Dependent Is this a change from baseline?: Pre-admission baseline Grooming: Dependent Is this a change from baseline?: Pre-admission baseline Feeding: Needs assistance Is this a change from baseline?: Pre-admission baseline Bathing: Dependent Is this a change from baseline?: Pre-admission baseline Toileting: Dependent Is this a change from baseline?: Pre-admission baseline In/Out Bed:  Dependent Is this a change from baseline?: Pre-admission baseline Walks in Home: Dependent Is this a change from baseline?: Pre-admission baseline Does the patient have difficulty walking or climbing stairs?: Yes Weakness of Legs: Both Weakness of Arms/Hands: None  Permission Sought/Granted                  Emotional Assessment              Admission diagnosis:  Hypothermia [T68.XXXA] Aspiration pneumonia ([place]) [J69.0] Hypothermia, initial encounter [T68.XXXA] Patient Active Problem List   Diagnosis Date Noted   Acute encephalopathy 09/02/2021   GERD (gastroesophageal reflux disease) 09/02/2021   Normal anion gap metabolic acidosis 25/36/6440   Hypoalbuminemia 09/02/2021   Right bundle branch block 08/12/2021   Right ventricular hypertrophy 08/09/2021   Bradycardia 08/09/2021   Urinary frequency 08/09/2021   OSA (obstructive sleep apnea) 07/16/2021   Hypothermia 07/15/2021   Lactic acidosis 34/74/2595   Acute metabolic encephalopathy 63/87/5643   Aspiration pneumonia ([place])    AKI (acute kidney injury) ([place])    Decreased GFR 05/26/2021   Normocytic anemia 05/26/2021   Cerumen impaction 05/17/2021   Frequent urination 05/17/2021   Tremor 02/09/2021   Generalized weakness 02/09/2021   Elevated PSA 09/05/2017   Leukocytopenia 09/01/2016   Mixed hyperlipidemia due to type 2 diabetes mellitus ([place]) 02/19/2014   Encounter for Medicare annual wellness exam 08/27/2012   Obesity 08/18/2011   Colon cancer  screening 08/18/2011   Routine general medical examination at a health care facility 08/10/2011   Prostate cancer screening 08/10/2011   Controlled type 2 diabetes mellitus without complication, without long-term current use of insulin ([place]) 05/30/2010   HYPERTENSION, BENIGN ESSENTIAL 12/10/2006   Intellectual disability 11/23/2006   PCP:  [name], MD Pharmacy:   [place], [place] - [address], [address],  [place] [address] Phone: [phone] Fax: [phone]  [id number] [place], [id number]: [phone]: [phone]    Social Determinants of Health (SDOH) Interventions    Readmission Risk Interventions     No data to display

## 2021-11-07 NOTE — Progress Notes (Signed)
PROGRESS NOTE  Daniel Bailey.  MWN:[id number] DOB: Sep 08, 1950 DOA: 11/04/2021 PCP: [name], MD   Brief Narrative: Patient is a 71 year old male with history of intellectual disability, recurrent aspiration pneumonia, oropharyngeal dysphagia , on pured diet at the skilled nursing facility, anemia who was sent here for the evaluation of hypothermia, cough.  Patient was unable to provide history.  On reviewing his previous records, it was found that he had multiple episodes of aspiration pneumonia.  Patient was felt cold, lethargic, hypothermic, bradycardic.  On presentation he was hypothermic with temperature of 88.  Chest x-ray showed right lower lobe infiltrates.  Patient was admitted for the management of sepsis secondary to aspiration pneumonia.  Due to recurrent aspiration pneumonia, decision was made for PEG, IR consulted, waiting for final decision from family.  Assessment & Plan:  Principal Problem:   Hypothermia Active Problems:   Aspiration pneumonia ([place])   Sepsis secondary to aspiration pneumonia: Presented with hypothermia, leukopenia.  Cultures have not shown any growth till date. Started on Unasyn.  Speech therapy evaluated him and is started on dysphagia 1 diet.   He has known history of oropharyngeal dysphagia/aspiration pneumonia and was on pured diet. The chest x-ray showed right lower lobe infiltrates, CT chest did not show any pneumonia.  CT chest showed  primarily lower lobe, bronchial wall thickening with adjacent hazy and reticular opacities, consistent with bronchitis and associated atelectasis. 4 mm sub solid nodules, left upper lobe and right lower lobe superior segment, not evident on the prior study. Non-contrast chest CT at 3-6 months is recommended.  Currently he is not in any respiratory distress, on room air. Family interested in PEG placement, IR consulted.  Agree with this plan.  Family waiting for final decision.  AKI on CkD stage 2 /hypokalemia:  Creatinine bumped up to 1.8 today.  On reviewing his previous records, his baseline creatinine is around 1.3.-1.4 .  Started on IV fluids again.  Continue gentle IV fluids for today.  Potassium supplemented  Hypothermia: Put on [facility].  Currently temperature normal .  Thyroid function test is normal.  Normocytic anemia/thrombocytopenia: Hemoglobin dropped to 8 most likely secondary to hemodilution.  On reviewing his previous records, his platelets has been fluctuating around 140-250.  Chronic intellectual impairment: Continue supportive care  Goals of care: CODE STATUS DNR.  Lives at a skilled nursing facility.  TOC consulted.  Very deconditioned, poor quality of life with recurrent aspiration.             DVT prophylaxis:enoxaparin (LOVENOX) injection 40 mg Start: 11/04/21 1645     Code Status: DNR  Family Communication: Called and discussed with legal guardian [name] on 7/2  Patient status:Inpatient  Patient is from :SNF  Anticipated discharge to:SNF  Estimated DC date: after PEG decision   Consultants: None  Procedures:None  Antimicrobials:  Anti-infectives (From admission, onward)    Start     Dose/Rate Route Frequency Ordered Stop   11/04/21 1645  Ampicillin-Sulbactam (UNASYN) 3 g in sodium chloride 0.9 % 100 mL IVPB        3 g 200 mL/hr over 30 Minutes Intravenous Every 6 hours 11/04/21 1632     11/04/21 1600  vancomycin (VANCOREADY) IVPB 1750 mg/350 mL  Status:  Discontinued        1,750 mg 175 mL/hr over 120 Minutes Intravenous  Once 11/04/21 1548 11/04/21 1628   11/04/21 1600  ceFEPIme (MAXIPIME) 2 g in sodium chloride 0.9 % 100 mL IVPB  Status:  Discontinued        2 g 200 mL/hr over 30 Minutes Intravenous  Once 11/04/21 1548 11/04/21 1628       Subjective: Patient seen and examined at the bedside this morning.  Hemodynamically stable, without any complaints.  On room air.   Objective: Vitals:   11/07/21 0102 11/07/21 0200 11/07/21 0700  11/07/21 0721  BP: 95/65  99/63 99/63  Pulse: 74  64 63  Resp: '20  16 19  '$ Temp:  97.8 F (36.6 C) (!) 97.3 F (36.3 C) (!) 97.3 F (36.3 C)  TempSrc: Oral  Oral Oral  SpO2: 90%  100% 96%  Weight:  90.4 kg    Height:        Intake/Output Summary (Last 24 hours) at 11/07/2021 1055 Last data filed at 11/07/2021 0804 Gross per 24 hour  Intake 1798.94 ml  Output 1800 ml  Net -1.06 ml   Filed Weights   11/05/21 0400 11/06/21 0353 11/07/21 0200  Weight: 88.9 kg 88.5 kg 90.4 kg    Examination:  General exam: Very deconditioned, lying in bed HEENT: PERRL Respiratory system:  no wheezes or crackles  Cardiovascular system: S1 & S2 heard, RRR.  Gastrointestinal system: Abdomen is nondistended, soft and nontender. Central nervous system: Alert and awake Extremities: No edema, no clubbing ,no cyanosis Skin: No rashes, no ulcers,no icterus     Data Reviewed: I have personally reviewed following labs and imaging studies  CBC: Recent Labs  Lab 11/04/21 1340 11/05/21 0258 11/06/21 0444  WBC 3.8* 3.7* 10.2  NEUTROABS 2.9  --   --   HGB 10.9* 8.5* 8.0*  HCT 33.1* 26.0* 24.7*  MCV 95.1 94.5 93.9  PLT 141* 133* 124*   Basic Metabolic Panel: Recent Labs  Lab 11/04/21 1340 11/05/21 0258 11/06/21 0444 11/07/21 0758  NA 138 141 142 141  K 4.7 5.4* 4.2 3.4*  CL 103 107 109 109  CO2 '27 23 23 24  '$ GLUCOSE 121* 84 103* 100*  BUN 25* 26* 26* 24*  CREATININE 1.20 1.60* 1.86* 1.64*  CALCIUM 9.4 8.4* 8.3* 8.3*     Recent Results (from the past 240 hour(s))  Urine Culture     Status: Abnormal   Collection Time: 11/04/21  1:29 PM   Specimen: In/Out Cath Urine  Result Value Ref Range Status   Specimen Description IN/OUT CATH URINE  Final   Special Requests   Final    NONE Performed at [facility], [address] [place], [place] [address]    Culture >=100,000 COLONIES/mL ENTEROCOCCUS FAECALIS (A)  Final   Report Status 11/07/2021 FINAL  Final   Organism ID, Bacteria  ENTEROCOCCUS FAECALIS (A)  Final      Susceptibility   Enterococcus faecalis - MIC*    AMPICILLIN <=2 SENSITIVE Sensitive     NITROFURANTOIN <=16 SENSITIVE Sensitive     VANCOMYCIN 1 SENSITIVE Sensitive     * >=100,000 COLONIES/mL ENTEROCOCCUS FAECALIS  Blood Culture (routine x 2)     Status: None (Preliminary result)   Collection Time: 11/04/21  1:40 PM   Specimen: BLOOD  Result Value Ref Range Status   Specimen Description BLOOD BLOOD RIGHT WRIST  Final   Special Requests   Final    BOTTLES DRAWN AEROBIC AND ANAEROBIC Blood Culture adequate volume   Culture   Final    NO GROWTH 2 DAYS Performed at [facility], [address], [place], [place] [address]    Report Status PENDING  Incomplete  Blood Culture (routine x 2)     Status: None (Preliminary result)   Collection Time: 11/04/21  1:53 PM   Specimen: BLOOD  Result Value Ref Range Status   Specimen Description BLOOD LEFT ANTECUBITAL  Final   Special Requests   Final    BOTTLES DRAWN AEROBIC AND ANAEROBIC Blood Culture adequate volume   Culture   Final    NO GROWTH 2 DAYS Performed at [facility], [place] [address], [place], [place] [address]    Report Status PENDING  Incomplete     Radiology Studies: No results found.  Scheduled Meds:  enoxaparin (LOVENOX) injection  40 mg Subcutaneous Q24H   ferrous sulfate  325 mg Oral Q breakfast   guaiFENesin  1,200 mg Oral BID   midodrine  5 mg Oral TID WC   pantoprazole  40 mg Oral Daily   potassium chloride  40 mEq Oral Once   Continuous Infusions:  sodium chloride 75 mL/hr at 11/07/21 0212   ampicillin-sulbactam (UNASYN) IV 3 g (11/07/21 0835)     LOS: 3 days   Shelly Coss, MD [facility] P7/07/2021, 10:55 AM

## 2021-11-07 NOTE — Care Management Important Message (Signed)
Important Message  Patient Details  Name: Daniel Bailey. MRN: [id number] Date of Birth: 1950/05/19   Medicare Important Message Given:  Yes     Shelda Altes 11/07/2021, 9:26 AM

## 2021-11-08 DIAGNOSIS — T68XXXA Hypothermia, initial encounter: Secondary | ICD-10-CM | POA: Diagnosis not present

## 2021-11-08 LAB — BASIC METABOLIC PANEL
Anion gap: 8 (ref 5–15)
BUN: 25 mg/dL — ABNORMAL HIGH (ref 8–23)
CO2: 23 mmol/L (ref 22–32)
Calcium: 8.4 mg/dL — ABNORMAL LOW (ref 8.9–10.3)
Chloride: 114 mmol/L — ABNORMAL HIGH (ref 98–111)
Creatinine, Ser: 1.55 mg/dL — ABNORMAL HIGH (ref 0.61–1.24)
GFR, Estimated: 48 mL/min — ABNORMAL LOW (ref >60)
Glucose, Bld: 109 mg/dL — ABNORMAL HIGH (ref 70–99)
Potassium: 3.7 mmol/L (ref 3.5–5.1)
Sodium: 145 mmol/L (ref 135–145)

## 2021-11-08 MED ORDER — POTASSIUM CHLORIDE 20 MEQ PO PACK
40.0000 meq | PACK | Freq: Once | ORAL | Status: AC
  Administered 2021-11-08: 40 meq via ORAL
  Filled 2021-11-08: qty 2

## 2021-11-08 MED ORDER — MAGNESIUM SULFATE 2 GM/50ML IV SOLN
2.0000 g | Freq: Once | INTRAVENOUS | Status: AC
  Administered 2021-11-08: 2 g via INTRAVENOUS
  Filled 2021-11-08: qty 50

## 2021-11-08 NOTE — Plan of Care (Signed)
  Problem: Clinical Measurements: Goal: Diagnostic test results will improve Outcome: Progressing Goal: Respiratory complications will improve Outcome: Progressing   

## 2021-11-08 NOTE — Progress Notes (Addendum)
PROGRESS NOTE  Daniel Bailey.  EHU:[id number] DOB: [birth year] DOA: 11/04/2021 PCP: [name], MD   Brief Narrative: Patient is a 71 year old male with history of intellectual disability, recurrent aspiration pneumonia, oropharyngeal dysphagia , on pured diet at the skilled nursing facility, anemia who was sent here for the evaluation of hypothermia, cough.  Patient was unable to provide history.  On reviewing his previous records, it was found that he had multiple episodes of aspiration pneumonia.  Patient was felt cold, lethargic, hypothermic, bradycardic.  On presentation he was hypothermic with temperature of 88.  Chest x-ray showed right lower lobe infiltrates.  Patient was admitted for the management of sepsis secondary to aspiration pneumonia.  Due to recurrent aspiration pneumonia, decision was made for PEG, IR consulted, waiting for final decision from family.  Assessment & Plan:  Principal Problem:   Hypothermia Active Problems:   Aspiration pneumonia ([place])   Sepsis secondary to aspiration pneumonia: Presented with hypothermia, leukopenia.  Cultures have not shown any growth till date. Started on Unasyn,completed course.  Speech therapy evaluated him and  started on dysphagia 1 diet.   He has known history of oropharyngeal dysphagia/aspiration pneumonia and was on pured diet. The chest x-ray showed right lower lobe infiltrates, CT chest did not show any pneumonia.  CT chest showed  primarily lower lobe, bronchial wall thickening with adjacent hazy and reticular opacities, consistent with bronchitis and associated atelectasis. 4 mm sub solid nodules, left upper lobe and right lower lobe superior segment, not evident on the prior study. Non-contrast chest CT at 3-6 months is recommended.  Currently he is not in any respiratory distress, on room air. Family interested in PEG placement, IR consulted.  Agree with this plan.  Family waiting for final decision.  AKI on CkD stage 2  /hypokalemia: Creatinine bumped up to 1.8 today.  On reviewing his previous records, his baseline creatinine is around 1.3.-1.4 .  Started on IV fluids again.  Continue gentle IV fluids for today.  Potassium supplemented  Hypothermia: Put on [facility].  Currently temperature normal .  Thyroid function test is normal.  Normocytic anemia/thrombocytopenia: Hemoglobin dropped to 8 most likely secondary to hemodilution.  On reviewing his previous records, his platelets has been fluctuating around 140-250.  Chronic intellectual impairment: Continue supportive care  Goals of care: CODE STATUS DNR.  Lives at a skilled nursing facility.  TOC consulted.  Very deconditioned, poor quality of life with recurrent aspiration.             DVT prophylaxis:enoxaparin (LOVENOX) injection 40 mg Start: 11/04/21 1645     Code Status: DNR  Family Communication: Called and discussed with legal guardian [name] on 7/4  Patient status:Inpatient  Patient is from :SNF  Anticipated discharge to:SNF  Estimated DC date: after PEG decision   Consultants: None  Procedures:None  Antimicrobials:  Anti-infectives (From admission, onward)    Start     Dose/Rate Route Frequency Ordered Stop   11/04/21 1645  Ampicillin-Sulbactam (UNASYN) 3 g in sodium chloride 0.9 % 100 mL IVPB        3 g 200 mL/hr over 30 Minutes Intravenous Every 6 hours 11/04/21 1632 11/09/21 1959   11/04/21 1600  vancomycin (VANCOREADY) IVPB 1750 mg/350 mL  Status:  Discontinued        1,750 mg 175 mL/hr over 120 Minutes Intravenous  Once 11/04/21 1548 11/04/21 1628   11/04/21 1600  ceFEPIme (MAXIPIME) 2 g in sodium chloride 0.9 % 100 mL IVPB  Status:  Discontinued        2 g 200 mL/hr over 30 Minutes Intravenous  Once 11/04/21 1548 11/04/21 1628       Subjective: Patient seen and examined at the bedside this morning.  Hemodynamically stable without any complaints.  Eating his breakfast.   Objective: Vitals:   11/07/21 1951  11/08/21 0000 11/08/21 0400 11/08/21 0723  BP: 121/65 96/62 114/83 (!) 98/56  Pulse: (!) 53 63 80 70  Resp: (!) 21 20 (!) 32 20  Temp: 98.1 F (36.7 C) 98.2 F (36.8 C) 97.6 F (36.4 C) 97.6 F (36.4 C)  TempSrc: Oral Oral Oral Oral  SpO2: 94% 92% 94% 97%  Weight:  89.5 kg    Height:        Intake/Output Summary (Last 24 hours) at 11/08/2021 1059 Last data filed at 11/08/2021 1027 Gross per 24 hour  Intake 680 ml  Output 1100 ml  Net -420 ml   Filed Weights   11/06/21 0353 11/07/21 0200 11/08/21 0000  Weight: 88.5 kg 90.4 kg 89.5 kg    Examination:  General exam: Overall comfortable, not in distress, appears deconditioned, chronically ill looking HEENT: PERRL Respiratory system:  no wheezes or crackles  Cardiovascular system: S1 & S2 heard, RRR.  Gastrointestinal system: Abdomen is nondistended, soft and nontender. Central nervous system: Alert and oriented Extremities: No edema, no clubbing ,no cyanosis Skin: No rashes, no ulcers,no icterus     Data Reviewed: I have personally reviewed following labs and imaging studies  CBC: Recent Labs  Lab 11/04/21 1340 11/05/21 0258 11/06/21 0444  WBC 3.8* 3.7* 10.2  NEUTROABS 2.9  --   --   HGB 10.9* 8.5* 8.0*  HCT 33.1* 26.0* 24.7*  MCV 95.1 94.5 93.9  PLT 141* 133* 932*   Basic Metabolic Panel: Recent Labs  Lab 11/05/21 0258 11/06/21 0444 11/07/21 0758 11/07/21 1550 11/08/21 0157  NA 141 142 141 145 145  K 5.4* 4.2 3.4* 4.0 3.7  CL 107 109 109 113* 114*  CO2 '23 23 24 23 23  '$ GLUCOSE 84 103* 100* 114* 109*  BUN 26* 26* 24* 29* 25*  CREATININE 1.60* 1.86* 1.64* 1.62* 1.55*  CALCIUM 8.4* 8.3* 8.3* 8.4* 8.4*  MG  --   --   --  1.9  --      Recent Results (from the past 240 hour(s))  Urine Culture     Status: Abnormal   Collection Time: 11/04/21  1:29 PM   Specimen: In/Out Cath Urine  Result Value Ref Range Status   Specimen Description IN/OUT CATH URINE  Final   Special Requests   Final     NONE Performed at [facility], [address], [place], [place] [address]    Culture >=100,000 COLONIES/mL ENTEROCOCCUS FAECALIS (A)  Final   Report Status 11/07/2021 FINAL  Final   Organism ID, Bacteria ENTEROCOCCUS FAECALIS (A)  Final      Susceptibility   Enterococcus faecalis - MIC*    AMPICILLIN <=2 SENSITIVE Sensitive     NITROFURANTOIN <=16 SENSITIVE Sensitive     VANCOMYCIN 1 SENSITIVE Sensitive     * >=100,000 COLONIES/mL ENTEROCOCCUS FAECALIS  Blood Culture (routine x 2)     Status: None (Preliminary result)   Collection Time: 11/04/21  1:40 PM   Specimen: BLOOD  Result Value Ref Range Status   Specimen Description BLOOD BLOOD RIGHT WRIST  Final   Special Requests   Final    BOTTLES DRAWN AEROBIC AND ANAEROBIC Blood Culture  adequate volume   Culture   Final    NO GROWTH 4 DAYS Performed at [facility], [place] [address] [place], [place] [address]    Report Status PENDING  Incomplete  Blood Culture (routine x 2)     Status: None (Preliminary result)   Collection Time: 11/04/21  1:53 PM   Specimen: BLOOD  Result Value Ref Range Status   Specimen Description BLOOD LEFT ANTECUBITAL  Final   Special Requests   Final    BOTTLES DRAWN AEROBIC AND ANAEROBIC Blood Culture adequate volume   Culture   Final    NO GROWTH 4 DAYS Performed at [facility], [place] [address], [place],  [address]    Report Status PENDING  Incomplete     Radiology Studies: No results found.  Scheduled Meds:  enoxaparin (LOVENOX) injection  40 mg Subcutaneous Q24H   ferrous sulfate  325 mg Oral Q breakfast   guaiFENesin  1,200 mg Oral BID   midodrine  5 mg Oral TID WC   pantoprazole  40 mg Oral Daily   Continuous Infusions:  sodium chloride 75 mL/hr at 11/07/21 2258   ampicillin-sulbactam (UNASYN) IV 3 g (11/08/21 0344)     LOS: 4 days   Shelly Coss, MD [facility] P7/08/2021, 10:59 AM

## 2021-11-08 NOTE — Progress Notes (Signed)
Speech Language Pathology Treatment: Dysphagia  Patient Details Name: Daniel Bailey. MRN: [id number] DOB: 12/17/1950 Today's Date: 11/08/2021 Time: 4665-9935 SLP Time Calculation (min) (ACUTE ONLY): 12 min  Assessment / Plan / Recommendation Clinical Impression  Pt was seen for f/u on previously recommended diet of Dys 1 solids and nectar thick liquids, recommended in the past if pt/family willing to accept aspiration risk in order to continue PO diet. Initially this admission, it was documented that family still wanted him to take POs. During today's session, his swallowing still appears to be grossly consistent with previous evaluations but he at least seems to be more alert than he did during eval earlier this admission. He can also be cued for use of a second swallow and simple strategies like small sips.   Per more recent notes, family is now considering PEG. SLP reached out to MD to try to clarify overall GOC. SLP expressed concerns about chronic risk for aspiration, with persistent risk even if made NPO, as pt will continue to be at risk for aspiration of secretions and/or oral bacteria. However, per Dr. Tawanna Solo, goal is to proceed with PEG and transition to pt being strictly NPO. Note that per IR note on 7/2, family stated that, "Her main concern is that patient enjoys eating so much and she is afraid that he may not able able to eat after G tube placement, which will affect quality of patient's life significantly."   SLP recommends considering palliative care involvement to help family clearly establish GOC, as they seem to be considering both PEG placement as well as comfort feedings and may benefit from further education about choices. MD made aware that current diet order in place is reflective of wishes to accept aspiration risk (pt on Dys 1 diet and nectar thick liquids at the moment). Will continue to follow as indicated.   HPI HPI: Daniel Bailey. is a 71 y.o. male with medical history  significant  intellectual disability, recurrent aspiration pneumonia, oropharyngeal dysphagia with aspiration of all consistencies, including solids, anemia secondary to chronic illness, sent from nursing home for worsening of hypothermia and cough. Went to [facility] inpt rehab in May, was D/Cd on a dysphagia 1 diet with nectar thick liquids. MBS 09/06/21: "moderate to severe oropharyngeal dysphagia with delayed oral transit decreased bolus cohesion, lingual and palatal residue as well as significantly decreased strength of mid/lower pharyngeal constrictors with vallecular and pyriform sinus residue with all textures that spill into the airway post swallow. Pt aspirates purees and solids as well. Pt only senses larger volume aspiration. Attempted to cue pt to clear throat and swallow again. With max cueing he was able to do this, but with low effort and weakness. Perhaps with training pt could improve, but aspiration risk will be high with all textures."      SLP Plan  Continue with current plan of care      Recommendations for follow up therapy are one component of a multi-disciplinary discharge planning process, led by the attending physician.  Recommendations may be updated based on patient status, additional functional criteria and insurance authorization.    Recommendations  Diet recommendations: Dysphagia 1 (puree);Nectar-thick liquid (if family still wants to accept aspiration risk) Liquids provided via: Cup;Straw Medication Administration: Crushed with puree Supervision: Staff to assist with self feeding Compensations: Minimize environmental distractions;Slow rate;Small sips/bites;Multiple dry swallows after each bite/sip Postural Changes and/or Swallow Maneuvers: Seated upright 90 degrees  Oral Care Recommendations: Oral care BID Follow Up Recommendations:  (tba) Assistance recommended at discharge: Frequent or constant Supervision/Assistance SLP Visit Diagnosis: Dysphagia,  oropharyngeal phase (R13.12) Plan: Continue with current plan of care           Osie Bond., M.A. [place] Office [phone]  Secure chat preferred   11/08/2021, 3:15 PM

## 2021-11-09 DIAGNOSIS — Z7189 Other specified counseling: Secondary | ICD-10-CM | POA: Diagnosis not present

## 2021-11-09 DIAGNOSIS — J690 Pneumonitis due to inhalation of food and vomit: Secondary | ICD-10-CM | POA: Diagnosis not present

## 2021-11-09 DIAGNOSIS — T68XXXA Hypothermia, initial encounter: Secondary | ICD-10-CM | POA: Diagnosis not present

## 2021-11-09 LAB — BASIC METABOLIC PANEL
Anion gap: 10 (ref 5–15)
BUN: 19 mg/dL (ref 8–23)
CO2: 23 mmol/L (ref 22–32)
Calcium: 8.6 mg/dL — ABNORMAL LOW (ref 8.9–10.3)
Chloride: 111 mmol/L (ref 98–111)
Creatinine, Ser: 1.28 mg/dL — ABNORMAL HIGH (ref 0.61–1.24)
GFR, Estimated: 60 mL/min — ABNORMAL LOW (ref >60)
Glucose, Bld: 107 mg/dL — ABNORMAL HIGH (ref 70–99)
Potassium: 4.2 mmol/L (ref 3.5–5.1)
Sodium: 144 mmol/L (ref 135–145)

## 2021-11-09 LAB — CULTURE, BLOOD (ROUTINE X 2)
Culture: NO GROWTH
Culture: NO GROWTH
Special Requests: ADEQUATE
Special Requests: ADEQUATE

## 2021-11-09 MED ORDER — CEFAZOLIN SODIUM-DEXTROSE 2-4 GM/100ML-% IV SOLN
2.0000 g | Freq: Once | INTRAVENOUS | Status: AC
Start: 2021-11-10 — End: 2021-11-10
  Administered 2021-11-10: 2 g via INTRAVENOUS
  Filled 2021-11-09 (×2): qty 100

## 2021-11-09 NOTE — TOC Progression Note (Signed)
Transition of Care (TOC) - Progression Note    Patient Details  Name: Daniel Bailey. MRN: [id number] Date of Birth: June 01, 1950  Transition of Care [facility]) CM/SW [place], [place] Phone Number: 11/09/2021, 12:26 PM  Clinical Narrative:     CSW called pt's legal guardian [name]. She confirmed pt is from [facility] and plans on returning. Pt has medicare A and B and is in process of applying for medicaid. TOC will continue to follow for DC coordination.   Expected Discharge Plan: [place] Barriers to Discharge: Continued Medical Work up  Expected Discharge Plan and Services Expected Discharge Plan: [place] arrangements for the past 2 months: [place]                                       Social Determinants of Health (SDOH) Interventions    Readmission Risk Interventions     No data to display

## 2021-11-09 NOTE — NC FL2 (Signed)
[place] LEVEL OF CARE SCREENING TOOL     IDENTIFICATION  Patient Name: Daniel Bailey. Birthdate: 03/23/1951 Sex: male Admission Date (Current Location): 11/04/2021  [facility] and [place] Number:  Herbalist and Address:  [facility] [place], [place] [address], [place], [place] [address]      Provider Number: [id number]  Attending Physician Name and Address:  Shelly Coss, MD  Relative Name and Phone Number:  [name] ([place]) (Relative)   [phone] (Home Phone)    Current Level of Care: Hospital Recommended Level of Care: [place] Prior Approval Number:    Date Approved/Denied:   PASRR Number: [id number]  Discharge Plan: SNF    Current Diagnoses: Patient Active Problem List   Diagnosis Date Noted   Acute encephalopathy 09/02/2021   GERD (gastroesophageal reflux disease) 09/02/2021   Normal anion gap metabolic acidosis 19/50/9326   Hypoalbuminemia 09/02/2021   Right bundle branch block 08/12/2021   Right ventricular hypertrophy 08/09/2021   Bradycardia 08/09/2021   Urinary frequency 08/09/2021   OSA (obstructive sleep apnea) 07/16/2021   Hypothermia 07/15/2021   Lactic acidosis 71/24/5809   Acute metabolic encephalopathy 98/33/8250   Aspiration pneumonia (HCC)    AKI (acute kidney injury) ([place])    Decreased GFR 05/26/2021   Normocytic anemia 05/26/2021   Cerumen impaction 05/17/2021   Frequent urination 05/17/2021   Tremor 02/09/2021   Generalized weakness 02/09/2021   Elevated PSA 09/05/2017   Leukocytopenia 09/01/2016   Mixed hyperlipidemia due to type 2 diabetes mellitus ([place]) 02/19/2014   Encounter for Medicare annual wellness exam 08/27/2012   Obesity 08/18/2011   Colon cancer screening 08/18/2011   Routine general medical examination at a health care facility 08/10/2011   Prostate cancer screening 08/10/2011   Controlled type 2 diabetes mellitus without complication, without  long-term current use of insulin ([place]) 05/30/2010   HYPERTENSION, BENIGN ESSENTIAL 12/10/2006   Intellectual disability 11/23/2006    Orientation RESPIRATION BLADDER Height & Weight     Self  O2 (2L [place]) Incontinent, External catheter Weight: 201 lb 4.5 oz (91.3 kg) Height:  '5\' 6"'$  (167.6 cm)  BEHAVIORAL SYMPTOMS/MOOD NEUROLOGICAL BOWEL NUTRITION STATUS      Continent Diet (see d/c summary)  AMBULATORY STATUS COMMUNICATION OF NEEDS Skin   Extensive Assist Verbally Normal                       Personal Care Assistance Level of Assistance  Bathing, Feeding, Dressing Bathing Assistance: Limited assistance Feeding assistance: Independent Dressing Assistance: Limited assistance     Functional Limitations Info  Sight, Hearing, Speech Sight Info: Adequate Hearing Info: Adequate Speech Info: Adequate    SPECIAL CARE FACTORS FREQUENCY  PT (By licensed PT), OT (By licensed OT)     PT Frequency: 5x/week OT Frequency: 5x/week            Contractures Contractures Info: Not present    Additional Factors Info  Code Status, Allergies Code Status Info: DNR Allergies Info: no known allergies           Current Medications (11/09/2021):  This is the current hospital active medication list Current Facility-Administered Medications  Medication Dose Route Frequency Provider Last Rate Last Admin   0.9 %  sodium chloride infusion   Intravenous Continuous Shelly Coss, MD 75 mL/hr at 11/09/21 0701 New Bag at 11/09/21 0701   Ampicillin-Sulbactam (UNASYN) 3 g in sodium chloride 0.9 % 100 mL IVPB  3 g Intravenous Q6H  Shelly Coss, MD 200 mL/hr at 11/09/21 0855 3 g at 11/09/21 0855   enoxaparin (LOVENOX) injection 40 mg  40 mg Subcutaneous Q24H Wynetta Fines T, MD   40 mg at 11/09/21 4970   ferrous sulfate tablet 325 mg  325 mg Oral Q breakfast Shelly Coss, MD   325 mg at 11/09/21 0856   food thickener (SIMPLYTHICK (NECTAR/LEVEL 2/MILDLY THICK)) 5 packet  5 packet Oral PRN  Shelly Coss, MD       guaiFENesin (MUCINEX) 12 hr tablet 1,200 mg  1,200 mg Oral BID Wynetta Fines T, MD   1,200 mg at 11/09/21 0857   midodrine (PROAMATINE) tablet 5 mg  5 mg Oral TID WC Adhikari, Amrit, MD   5 mg at 11/09/21 1159   ondansetron (ZOFRAN) injection 4 mg  4 mg Intravenous Q6H PRN Shelly Coss, MD   4 mg at 11/07/21 1809   pantoprazole (PROTONIX) EC tablet 40 mg  40 mg Oral Daily Shelly Coss, MD   40 mg at 11/09/21 2637     Discharge Medications: Please see discharge summary for a list of discharge medications.  Relevant Imaging Results:  Relevant Lab Results:   Additional Information [id number]  Bethann Berkshire, LCSW

## 2021-11-09 NOTE — Consult Note (Addendum)
Chief Complaint: Aspirating, needs Gtube  Referring Physician(s): Amrit Adhikari  Supervising Physician: Michaelle Birks  Patient Status: [facility] - In-pt  History of Present Illness: Daniel Bailey. is a 71 y.o. male intellectual disability, recurrent aspiration pneumonia, oropharyngeal dysphagia , on pured diet at the skilled nursing facility, anemia who was sent here for the evaluation of hypothermia, cough.    He has he had multiple episodes of aspiration pneumonia.    Chest x-ray showed right lower lobe infiltrates.    Due to recurrent aspiration pneumonia,we are asked to perform a gastrostomy tube placement.  He is sitting up in bed, watching [place], interacted with me and "seemed" to understand he needs a tube for nutrition.   Past Medical History:  Diagnosis Date   Anemia    Elevated PSA    Frequent urination    Glaucoma    History of COVID-19    Hyperglycemia    Hyperlipidemia    Hypertension    Sleep apnea     Past Surgical History:  Procedure Laterality Date   COLONOSCOPY     PROSTATE BIOPSY N/A 11/20/2017   Procedure: BIOPSY TRANSRECTAL ULTRASONIC PROSTATE (TUBP);  Surgeon: Abbie Sons, MD;  Location: [facility] ORS;  Service: Urology;  Laterality: N/A;    Allergies: Patient has no known allergies.  Medications: Prior to Admission medications   Medication Sig Start Date End Date Taking? Authorizing Provider  acetaminophen (TYLENOL) 500 MG tablet Take 1,000 mg by mouth every 8 (eight) hours as needed for moderate pain.   Yes [provider]  ferrous sulfate 325 (65 FE) MG tablet Take 1 tablet (325 mg total) by mouth daily with breakfast. 09/24/21  Yes Angiulli, Lavon Paganini, PA-C  midodrine (PROAMATINE) 5 MG tablet Take 1 tablet (5 mg total) by mouth 3 (three) times daily with meals. Patient taking differently: Take 5 mg by mouth daily. 08/29/21  Yes Tower, Wynelle Fanny, MD  pantoprazole (PROTONIX) 40 MG tablet Take 1 tablet (40 mg total) by mouth daily.  07/19/21  Yes Hongalgi, Lenis Dickinson, MD     Family History  Problem Relation Age of Onset   Colon cancer Mother    Kidney disease Mother    Cancer Father        unknown    Social History   Socioeconomic History   Marital status: Single    Spouse name: Not on file   Number of children: Not on file   Years of education: Not on file   Highest education level: Not on file  Occupational History   Not on file  Tobacco Use   Smoking status: Never   Smokeless tobacco: Never  Vaping Use   Vaping Use: Never used  Substance and Sexual Activity   Alcohol use: No    Alcohol/week: 0.0 standard drinks of alcohol   Drug use: No   Sexual activity: Not Currently  Other Topics Concern   Not on file  Social History Narrative   Not on file   Social Determinants of Health   Financial Resource Strain: Low Risk  (10/15/2020)   Overall Financial Resource Strain (CARDIA)    Difficulty of Paying Living Expenses: Not hard at all  Food Insecurity: No Food Insecurity (10/15/2020)   Hunger Vital Sign    Worried About Running Out of Food in the Last Year: Never true    [place] in the Last Year: Never true  Transportation Needs: No Transportation Needs (10/15/2020)   PRAPARE -  Hydrologist (Medical): No    Lack of Transportation (Non-Medical): No  Physical Activity: Inactive (10/15/2020)   Exercise Vital Sign    Days of Exercise per Week: 0 days    Minutes of Exercise per Session: 0 min  Stress: No Stress Concern Present (10/15/2020)   [place]    Feeling of Stress : Not at all  Social Connections: Not on file     Review of Systems  Unable to perform ROS: Other  Developmental delay  Vital Signs: BP 116/74 (BP Location: Right Arm)   Pulse (!) 55   Temp (!) 97.4 F (36.3 C) (Oral)   Resp 17   Ht '5\' 6"'$  (1.676 m)   Wt 201 lb 4.5 oz (91.3 kg)   SpO2 98%   BMI 32.49 kg/m   Physical  Exam Constitutional:      Appearance: Normal appearance.  HENT:     Head: Normocephalic and atraumatic.  Cardiovascular:     Rate and Rhythm: Normal rate and regular rhythm.  Pulmonary:     Effort: Pulmonary effort is normal. No respiratory distress.  Abdominal:     Palpations: Abdomen is soft.     Tenderness: There is no abdominal tenderness.     Comments: No scars. Some upper abdominal hair, may need to be clipped pre-procedure  Skin:    General: Skin is warm and dry.  Neurological:     General: No focal deficit present.     Mental Status: He is alert and oriented to person, place, and time.  Psychiatric:        Mood and Affect: Mood normal.     Comments: Developmental delay     Imaging: US RENAL  Result Date: 11/05/2021 CLINICAL DATA:  Acute kidney injury. EXAM: RENAL / URINARY TRACT ULTRASOUND COMPLETE COMPARISON:  None Available. FINDINGS: Right Kidney: Renal measurements: 11.1 x 4.3 x 5.2 cm = volume: 122.1 mL. Echogenicity within normal limits. No mass or hydronephrosis visualized. Left Kidney: Renal measurements: 11.0 x 5.6 x 4.6 cm = volume: 142 mL. Echogenicity within normal limits. No mass or hydronephrosis visualized. Bladder: Decompressed, not visualized. Other: None. IMPRESSION: 1. Normal renal ultrasound. Electronically Signed   By: Lajean Manes M.D.   On: 11/05/2021 10:22   CT CHEST WO CONTRAST  Result Date: 11/04/2021 CLINICAL DATA:  Fatigue.  Complicated pneumonia suspected. EXAM: CT CHEST WITHOUT CONTRAST TECHNIQUE: Multidetector CT imaging of the chest was performed following the standard protocol without IV contrast. RADIATION DOSE REDUCTION: This exam was performed according to the departmental dose-optimization program which includes automated exposure control, adjustment of the mA and/or kV according to patient size and/or use of iterative reconstruction technique. COMPARISON:  Current and prior chest radiographs. Prior chest CT dated 07/12/2021. FINDINGS:  Cardiovascular: Heart normal in size and configuration. No pericardial effusion or coronary artery calcifications. Mild descending thoracic aorta atherosclerotic calcifications. Mediastinum/Nodes: No enlarged mediastinal or axillary lymph nodes. Thyroid gland, trachea, and esophagus demonstrate no significant findings. Lungs/Pleura: Ill-defined sub solid nodule, 4 mm, left upper lobe, image 65, series 8. Similar appearing and similar-sized sub solid nodule in the superior segment of the right lower lobe, image 74, series 8. These were not present on the prior chest, abdomen and pelvis CT. Bronchial wall thickening with adjacent peribronchovascular reticular and hazy opacities in both lower lobes, and minimally at the base of the left upper lobe lingula. Remainder of the lungs is clear. No pleural  effusion or pneumothorax. Upper Abdomen: No acute abnormality. Musculoskeletal: No fracture or acute finding. No bone lesion. No chest wall mass. Right-sided gynecomastia. IMPRESSION: 1. No evidence of pneumonia. 2. Lung base, primarily lower lobe, bronchial wall thickening with adjacent hazy and reticular opacities. Findings consistent with bronchitis and associated atelectasis. Bronchitis may be acute or chronic. Consolidation noted in base of the left lower lobe on the prior CT has resolved. 3. 4 mm sub solid nodules, left upper lobe and right lower lobe superior segment, not evident on the prior study. Non-contrast chest CT at 3-6 months is recommended. If nodules persist and are stable at that time, consider additional non-contrast chest CT examinations at 2 and 4 years. This recommendation follows the consensus statement: Guidelines for Management of Incidental Pulmonary Nodules Detected on CT Images: From the Fleischner Society 2017; Radiology 2017; 284:228-243. 4. Mild aortic atherosclerosis. Aortic Atherosclerosis (ICD10-I70.0). Electronically Signed   By: Lajean Manes M.D.   On: 11/04/2021 18:14   DG Chest Port  1 View  Result Date: 11/04/2021 CLINICAL DATA:  Sepsis. EXAM: PORTABLE CHEST 1 VIEW COMPARISON:  09/14/2021; 09/02/2021 FINDINGS: Grossly unchanged cardiac silhouette and mediastinal contours. Unchanged bibasilar heterogeneous opacities. Redemonstrated biapical pleuroparenchymal thickening. No new focal airspace opacities. No pleural effusion or pneumothorax. No evidence of edema. No acute osseous abnormalities. IMPRESSION: Similar appearing bibasilar atelectasis without superimposed acute cardiopulmonary disease on this AP portable examination. Further evaluation with a PA and lateral chest radiograph may be obtained as clinically indicated. Electronically Signed   By: Sandi Mariscal M.D.   On: 11/04/2021 14:06    Labs:  CBC: Recent Labs    09/26/21 0632 11/04/21 1340 11/05/21 0258 11/06/21 0444  WBC 2.8* 3.8* 3.7* 10.2  HGB 9.1* 10.9* 8.5* 8.0*  HCT 27.1* 33.1* 26.0* 24.7*  PLT 232 141* 133* 113*    COAGS: Recent Labs    07/12/21 0635 11/04/21 1340  INR 1.0 0.9  APTT  --  42*    BMP: Recent Labs    11/07/21 0758 11/07/21 1550 11/08/21 0157 11/09/21 0221  NA 141 145 145 144  K 3.4* 4.0 3.7 4.2  CL 109 113* 114* 111  CO2 '24 23 23 23  '$ GLUCOSE 100* 114* 109* 107*  BUN 24* 29* 25* 19  CALCIUM 8.3* 8.4* 8.4* 8.6*  CREATININE 1.64* 1.62* 1.55* 1.28*  GFRNONAA 44* 45* 48* 60*    LIVER FUNCTION TESTS: Recent Labs    07/25/21 1046 09/02/21 1517 09/08/21 0459 11/04/21 1340  BILITOT 0.3 0.6 0.4 0.6  AST '27 29 23 29  '$ ALT '25 15 15 23  '$ ALKPHOS 81 96 82 151*  PROT 6.9 6.9 5.5* 6.9  ALBUMIN 3.9 3.1* 2.3* 3.1*    TUMOR MARKERS: No results for input(s): "AFPTM", "CEA", "CA199", "CHROMGRNA" in the last 8760 hours.  Assessment and Plan:  Recurrent aspiration pneumonia.  Will proceed with image guided gastrostomy tube placement as IR schedule allows (will plan for tomorrow).  Risks and benefits image guided gastrostomy tube placement was discussed with the patient's  legal guardian including, but not limited to the need for a barium enema during the procedure, bleeding, infection, peritonitis and/or damage to adjacent structures.  All questions were answered, patient is agreeable to proceed.  Consent signed and in chart.  Thank you for allowing our service to participate in Jaramie Bastos. 's care.  Electronically Signed: Murrell Redden, PA-C   11/09/2021, 2:25 PM      I spent a total of 40 Minutes  in face to face in clinical consultation, greater than 50% of which was counseling/coordinating care for [facility]

## 2021-11-09 NOTE — Progress Notes (Deleted)
HOSPITAL MEDICINE OVERNIGHT EVENT NOTE    Evaluated nursing that patient has not urinated all evening.  Bladder scan performed this morning revealing greater than 700 cc of urine.  Patient states that he does not need to void.  Order placed for intermittent catheterization and urinalysis.  I have additionally placed a as needed bladder scan order going forward.  If patient does not urinate for period of 6 hours going forward then a bladder scan can be performed with instructions to notify provider if greater than 300 cc on bladder scan.  Day team to consider initiation of Flomax at their discretion.  Daniel Emerald  MD [facility]

## 2021-11-09 NOTE — Plan of Care (Signed)
  Problem: Elimination: Goal: Will not experience complications related to bowel motility Outcome: Progressing Goal: Will not experience complications related to urinary retention Outcome: Progressing   Problem: Safety: Goal: Ability to remain free from injury will improve Outcome: Progressing   Problem: Skin Integrity: Goal: Risk for impaired skin integrity will decrease Outcome: Progressing   Problem: Coping: Goal: Level of anxiety will decrease Outcome: Progressing

## 2021-11-09 NOTE — Consult Note (Signed)
   [facility] CM Inpatient Consult   11/09/2021  Edger Husain. [birth year] [id number]  [place] [facility] [ACO] Patient: Medicare ACO REACH  *Patient has a legal guardian noted and listed as relative Gavin Potters  Primary Care Provider:  [name], Wynelle Fanny, MD [place]   is an embedded provider with a Chronic Care Management team and program, and is listed for the transition of care follow up and appointments.  Patient was screened for Embedded practice service needs for chronic care management and patient is likely to return to a skilled nursing facility as discussed in unit progression meeting for possible PEG prior to returning to a SNF level of care. Patient was discussed in morning progression meeting for disposition plan.   Record reviewed. Patient resting on round no family currently at bedside.    Plan: Continue to follow progress and TOC follow up for post hospital needs. If returning to a [facility] affiliated SNF will alert [facility] RN.   Please contact for further questions,  Natividad Brood, RN BSN [facility] Liaison  [phone] business mobile phone Toll free office [phone]  Fax number: [phone] [email or web address]

## 2021-11-09 NOTE — Progress Notes (Signed)
SLP Cancellation Note  Patient Details Name: Daniel Bailey. MRN: [id number] DOB: December 12, 1950   Cancelled treatment:       Reason Eval/Treat Not Completed: Other (comment). Pt is NPO today, possibly for potential PEG tube placement given notes. SLP is willing to work with pt on swallowing interventions for consuming PO with known risk if that is within his goals of care. At this time pt and family goals are not yet documented. MD and family are in discussions. SLP will await further guidance, but potentially sign off if there is no plan for oral intake for pts comfort and quality of life.   Daniel [place], MA CCC-SLP  Acute Rehabilitation Services Secure Chat Preferred Office [phone]  Daniel Bailey 11/09/2021, 9:18 AM

## 2021-11-09 NOTE — Progress Notes (Signed)
PROGRESS NOTE  Daniel Bailey.  TDS:[id number] DOB: 1951/03/02 DOA: 11/04/2021 PCP: [name], MD   Brief Narrative: Patient is a 71 year old male with history of intellectual disability, recurrent aspiration pneumonia, oropharyngeal dysphagia , on pured diet at the skilled nursing facility, anemia who was sent here for the evaluation of hypothermia, cough.  Patient was unable to provide history.  On reviewing his previous records, it was found that he had multiple episodes of aspiration pneumonia.  Patient was felt cold, lethargic, hypothermic, bradycardic.  On presentation he was hypothermic with temperature of 88.  Chest x-ray showed right lower lobe infiltrates.  Patient was admitted for the management of sepsis secondary to aspiration pneumonia.  Due to recurrent aspiration pneumonia, decision was made for PEG, IR consulted, plan for PEG  Assessment & Plan:  Principal Problem:   Hypothermia Active Problems:   Aspiration pneumonia ([place])   Sepsis secondary to aspiration pneumonia: Presented with hypothermia, leukopenia.  Cultures have not shown any growth till date. Started on Unasyn,completed course.  Speech therapy evaluated him and  started on dysphagia 1 diet.   He has known history of oropharyngeal dysphagia/aspiration pneumonia and was on pured diet. The chest x-ray showed right lower lobe infiltrates, CT chest did not show any pneumonia.  CT chest showed  primarily lower lobe, bronchial wall thickening with adjacent hazy and reticular opacities, consistent with bronchitis and associated atelectasis. 4 mm sub solid nodules, left upper lobe and right lower lobe superior segment, not evident on the prior study. Non-contrast chest CT at 3-6 months is recommended.  Currently he is not in any respiratory distress, on room air. Family interested in PEG placement, IR consulted.  Agree with this plan.   AKI on CkD stage 2 /hypokalemia: Creatinine bumped up to 1.8 today.  On reviewing his  previous records, his baseline creatinine is around 1.3.-1.4 .  Started on IV fluids again.  Continue gentle IV fluids for today.  Potassium stable now  Hypothermia: Put on [facility].  Currently temperature normal .  Thyroid function test is normal.  Normocytic anemia/thrombocytopenia: Hemoglobin dropped to 8 most likely secondary to hemodilution.  On reviewing his previous records, his platelets has been fluctuating around 140-250.  Chronic intellectual impairment: Continue supportive care  Goals of care: CODE STATUS DNR.  Lives at a skilled nursing facility.  TOC consulted.  Very deconditioned, poor quality of life with recurrent aspiration.             DVT prophylaxis:enoxaparin (LOVENOX) injection 40 mg Start: 11/04/21 1645     Code Status: DNR  Family Communication: Called and discussed with legal guardian [name] on 7/4  Patient status:Inpatient  Patient is from :SNF  Anticipated discharge to:SNF  Estimated DC date: after PEG    Consultants: None  Procedures:None  Antimicrobials:  Anti-infectives (From admission, onward)    Start     Dose/Rate Route Frequency Ordered Stop   11/04/21 1645  Ampicillin-Sulbactam (UNASYN) 3 g in sodium chloride 0.9 % 100 mL IVPB        3 g 200 mL/hr over 30 Minutes Intravenous Every 6 hours 11/04/21 1632 11/09/21 1959   11/04/21 1600  vancomycin (VANCOREADY) IVPB 1750 mg/350 mL  Status:  Discontinued        1,750 mg 175 mL/hr over 120 Minutes Intravenous  Once 11/04/21 1548 11/04/21 1628   11/04/21 1600  ceFEPIme (MAXIPIME) 2 g in sodium chloride 0.9 % 100 mL IVPB  Status:  Discontinued  2 g 200 mL/hr over 30 Minutes Intravenous  Once 11/04/21 1548 11/04/21 1628       Subjective: Patient seen and examined the bedside this morning.  Is on bed, comfortable without any problems.  Objective: Vitals:   11/08/21 1948 11/09/21 0000 11/09/21 0400 11/09/21 0728  BP: 118/72 113/79 (!) 115/57 114/66  Pulse: 66  63 (!) 50   Resp: '20 18 20 20  '$ Temp: 97.7 F (36.5 C) (!) 97.5 F (36.4 C) 98 F (36.7 C) (!) 97.5 F (36.4 C)  TempSrc: Oral Oral Oral Oral  SpO2: 96% 98% 100% 98%  Weight:  91.3 kg    Height:        Intake/Output Summary (Last 24 hours) at 11/09/2021 1110 Last data filed at 11/09/2021 5102 Gross per 24 hour  Intake 3195.23 ml  Output 1550 ml  Net 1645.23 ml   Filed Weights   11/07/21 0200 11/08/21 0000 11/09/21 0000  Weight: 90.4 kg 89.5 kg 91.3 kg    Examination:   General exam: Overall comfortable, not in distress, deconditioned, weak HEENT: PERRL Respiratory system:  no wheezes or crackles  Cardiovascular system: S1 & S2 heard, RRR.  Gastrointestinal system: Abdomen is nondistended, soft and nontender. Central nervous system: Alert and oriented Extremities: No edema, no clubbing ,no cyanosis Skin: No rashes, no ulcers,no icterus     Data Reviewed: I have personally reviewed following labs and imaging studies  CBC: Recent Labs  Lab 11/04/21 1340 11/05/21 0258 11/06/21 0444  WBC 3.8* 3.7* 10.2  NEUTROABS 2.9  --   --   HGB 10.9* 8.5* 8.0*  HCT 33.1* 26.0* 24.7*  MCV 95.1 94.5 93.9  PLT 141* 133* 585*   Basic Metabolic Panel: Recent Labs  Lab 11/06/21 0444 11/07/21 0758 11/07/21 1550 11/08/21 0157 11/09/21 0221  NA 142 141 145 145 144  K 4.2 3.4* 4.0 3.7 4.2  CL 109 109 113* 114* 111  CO2 '23 24 23 23 23  '$ GLUCOSE 103* 100* 114* 109* 107*  BUN 26* 24* 29* 25* 19  CREATININE 1.86* 1.64* 1.62* 1.55* 1.28*  CALCIUM 8.3* 8.3* 8.4* 8.4* 8.6*  MG  --   --  1.9  --   --      Recent Results (from the past 240 hour(s))  Urine Culture     Status: Abnormal   Collection Time: 11/04/21  1:29 PM   Specimen: In/Out Cath Urine  Result Value Ref Range Status   Specimen Description IN/OUT CATH URINE  Final   Special Requests   Final    NONE Performed at [facility], [address], [place], [place] [address]    Culture >=100,000 COLONIES/mL ENTEROCOCCUS  FAECALIS (A)  Final   Report Status 11/07/2021 FINAL  Final   Organism ID, Bacteria ENTEROCOCCUS FAECALIS (A)  Final      Susceptibility   Enterococcus faecalis - MIC*    AMPICILLIN <=2 SENSITIVE Sensitive     NITROFURANTOIN <=16 SENSITIVE Sensitive     VANCOMYCIN 1 SENSITIVE Sensitive     * >=100,000 COLONIES/mL ENTEROCOCCUS FAECALIS  Blood Culture (routine x 2)     Status: None (Preliminary result)   Collection Time: 11/04/21  1:40 PM   Specimen: BLOOD  Result Value Ref Range Status   Specimen Description BLOOD BLOOD RIGHT WRIST  Final   Special Requests   Final    BOTTLES DRAWN AEROBIC AND ANAEROBIC Blood Culture adequate volume   Culture   Final    NO GROWTH 4  DAYS Performed at [facility], [place] [address], [place], [place] [address]    Report Status PENDING  Incomplete  Blood Culture (routine x 2)     Status: None (Preliminary result)   Collection Time: 11/04/21  1:53 PM   Specimen: BLOOD  Result Value Ref Range Status   Specimen Description BLOOD LEFT ANTECUBITAL  Final   Special Requests   Final    BOTTLES DRAWN AEROBIC AND ANAEROBIC Blood Culture adequate volume   Culture   Final    NO GROWTH 4 DAYS Performed at [facility], [place] [address], [place], [place] [address]    Report Status PENDING  Incomplete     Radiology Studies: No results found.  Scheduled Meds:  enoxaparin (LOVENOX) injection  40 mg Subcutaneous Q24H   ferrous sulfate  325 mg Oral Q breakfast   guaiFENesin  1,200 mg Oral BID   midodrine  5 mg Oral TID WC   pantoprazole  40 mg Oral Daily   Continuous Infusions:  sodium chloride 75 mL/hr at 11/09/21 0701   ampicillin-sulbactam (UNASYN) IV 3 g (11/09/21 0855)     LOS: 5 days   Shelly Coss, MD [facility] P7/09/2021, 11:10 AM

## 2021-11-09 NOTE — TOC Progression Note (Signed)
Transition of Care (TOC) - Progression Note    Patient Details  Name: Daniel Bailey. MRN: [id number] Date of Birth: 05-Jun-1950  Transition of Care [facility]) CM/SW Contact  Zenon Mayo, RN Phone Number: 11/09/2021, 12:09 PM  Clinical Narrative:    Patient has a Legal Guardian [name] [phone].   Expected Discharge Plan: Skilled Nursing Facility Barriers to Discharge: Continued Medical Work up  Expected Discharge Plan and Services Expected Discharge Plan: [place]                                               Social Determinants of Health (SDOH) Interventions    Readmission Risk Interventions     No data to display

## 2021-11-09 NOTE — Consult Note (Addendum)
Consultation Note Date: 11/09/2021   Patient Name: Daniel Bailey.  DOB: Aug 24, 1950  MRN: [id number]  Age / Sex: 71 y.o., male  PCP: Tower, Wynelle Fanny, MD Referring Physician: Shelly Coss, MD  Reason for Consultation: Establishing goals of care  HPI/Patient Profile: 71 y.o. male  with past medical history of intellectual disability, recurrent aspiration pneumonia, oropharyngeal dysphagia on pured diet, anemia secondary to chronic illness admitted on 11/04/2021 with worsening hypothermia and cough.   Patient admitted 4 times in 6 months, has recurrent aspiration pneumonia. Planned for PEG placement. PMT has been consulted to assist with goals of care conversation.  Clinical Assessment and Goals of Care:  I have reviewed medical records including EPIC notes, labs and imaging, assessed the patient and then called patient's legal guardian [name] to discuss diagnosis prognosis, [place], EOL wishes, disposition and options.  I introduced Palliative Medicine as specialized medical care for people living with serious illness. It focuses on providing relief from the symptoms and stress of a serious illness. The goal is to improve quality of life for both the patient and the family.  We discussed a brief life review of the patient and then focused on their current illness. The trajectory and expectations at end of life were discussed.  I attempted to elicit values and goals of care important to the patient.    Medical History Review and Understanding:  Discussed patient's chronic illnesses, recurrent aspiration pneumonia and hospitalizations.   Discussion: Patient's legal guardian shares that after discussion amongst the family, they have come to a consensus on proceeding with PEG placement. [name]'s initial concern is that patient's quality of life heavily revolves around eating foods for enjoyment of taste. Other family  members have noted that he also enjoys other activities such as drawing. She feels like there is no other choice at this point, as he aspirated again very quickly after his May hospitalization. Counseled at length on the risks and benefits of PEG placements, emphasizing that patient will remain at risk for aspiration of his oral secretions and that quality of life typically does not improve in patients who proceed with this intervention. Shared my concern that a decision for comfort/risk feeding seems more aligned with patient's priorities and care preferences. Discussed risks of worsening quality of life including nausea and vomiting, diarrhea, or other symptoms resulting from tube feeds. Discussed risk factors for poor prognosis s/p PEG placement. Reviewed alternative care path of comfort feeds and hospice at SNF. [name] verbalizes her understanding and shares she will likely proceed with current plans to place PEG tomorrow afternoon. Counseled on the importance of ongoing [place] discussions based on clinical course.    The difference between aggressive medical intervention and comfort care was considered in light of the patient's goals of care. Hospice and Palliative Care services outpatient were explained and offered.   Discussed the importance of continued conversation with family and the medical providers regarding overall plan of care and treatment options, ensuring decisions are within the context of the patient's values and GOCs.   Questions and concerns were addressed. The family was encouraged to call with questions or concerns.  PMT will continue to support holistically.   LEGAL GUARDIAN is [name].    SUMMARY OF RECOMMENDATIONS   -DNR -Proceed with PEG placement as planned -Patient's legal guardian is agreeable to outpatient palliative care f/u at SNF, [facility] assistance is appreciated -PMT will continue to follow and support  Prognosis:  Unable to determine  Discharge Planning: To  Be Determined      Primary Diagnoses: Present on Admission:  Aspiration pneumonia [facility])    Physical Exam Vitals and nursing note reviewed.  Constitutional:      General: He is in acute distress.     Appearance: He is ill-appearing.  Cardiovascular:     Rate and Rhythm: Bradycardia present.  Pulmonary:     Effort: Pulmonary effort is normal.  Skin:    General: Skin is warm and dry.  Neurological:     Mental Status: He is alert. Mental status is at baseline.     Vital Signs: BP 116/74 (BP Location: Right Arm)   Pulse (!) 55   Temp (!) 97.4 F (36.3 C) (Oral)   Resp 17   Ht '5\' 6"'$  (1.676 m)   Wt 91.3 kg   SpO2 98%   BMI 32.49 kg/m  Pain Scale: 0-10   Pain Score: 0-No pain   SpO2: SpO2: 98 % O2 Device:SpO2: 98 % O2 Flow Rate: .O2 Flow Rate (L/min): 2 L/min   Palliative Assessment/Data:    MDM: High   [name] Johnnette Litter, PA-C  Palliative Medicine Team Team phone # [phone]  Thank you for allowing the Palliative Medicine Team to assist in the care of this patient. Please utilize secure chat with additional questions, if there is no response within 30 minutes please call the above phone number.  Palliative Medicine Team providers are available by phone from 7am to 7pm daily and can be reached through the team cell phone.  Should this patient require assistance outside of these hours, please call the patient's attending physician.

## 2021-11-10 ENCOUNTER — Inpatient Hospital Stay (HOSPITAL_COMMUNITY): Payer: Medicare Other

## 2021-11-10 ENCOUNTER — Encounter (HOSPITAL_COMMUNITY): Payer: Self-pay | Admitting: Internal Medicine

## 2021-11-10 DIAGNOSIS — T68XXXA Hypothermia, initial encounter: Secondary | ICD-10-CM | POA: Diagnosis not present

## 2021-11-10 HISTORY — PX: IR GASTROSTOMY TUBE MOD SED: IMG625

## 2021-11-10 LAB — CBC
HCT: 22 % — ABNORMAL LOW (ref 39.0–52.0)
Hemoglobin: 7.1 g/dL — ABNORMAL LOW (ref 13.0–17.0)
MCH: 31.4 pg (ref 26.0–34.0)
MCHC: 32.3 g/dL (ref 30.0–36.0)
MCV: 97.3 fL (ref 80.0–100.0)
Platelets: 182 10*3/uL (ref 150–400)
RBC: 2.26 MIL/uL — ABNORMAL LOW (ref 4.22–5.81)
RDW: 17.8 % — ABNORMAL HIGH (ref 11.5–15.5)
WBC: 6.8 10*3/uL (ref 4.0–10.5)
nRBC: 0 % (ref 0.0–0.2)

## 2021-11-10 LAB — BASIC METABOLIC PANEL
Anion gap: 8 (ref 5–15)
BUN: 16 mg/dL (ref 8–23)
CO2: 23 mmol/L (ref 22–32)
Calcium: 8.2 mg/dL — ABNORMAL LOW (ref 8.9–10.3)
Chloride: 113 mmol/L — ABNORMAL HIGH (ref 98–111)
Creatinine, Ser: 1.14 mg/dL (ref 0.61–1.24)
GFR, Estimated: >60 mL/min (ref >60)
Glucose, Bld: 111 mg/dL — ABNORMAL HIGH (ref 70–99)
Potassium: 3.4 mmol/L — ABNORMAL LOW (ref 3.5–5.1)
Sodium: 144 mmol/L (ref 135–145)

## 2021-11-10 LAB — PREPARE RBC (CROSSMATCH)

## 2021-11-10 MED ORDER — CEFAZOLIN SODIUM-DEXTROSE 2-4 GM/100ML-% IV SOLN
2.0000 g | Freq: Once | INTRAVENOUS | Status: AC
  Administered 2021-11-10: 2 g via INTRAVENOUS
  Filled 2021-11-10: qty 100

## 2021-11-10 MED ORDER — CEFAZOLIN SODIUM-DEXTROSE 2-4 GM/100ML-% IV SOLN
INTRAVENOUS | Status: AC
  Filled 2021-11-10: qty 100

## 2021-11-10 MED ORDER — LIDOCAINE HCL 1 % IJ SOLN
INTRAMUSCULAR | Status: AC
  Administered 2021-11-10: 10 mL
  Filled 2021-11-10: qty 20

## 2021-11-10 MED ORDER — GLUCAGON HCL RDNA (DIAGNOSTIC) 1 MG IJ SOLR
INTRAMUSCULAR | Status: AC | PRN
  Administered 2021-11-10: 1 mg via INTRAVENOUS

## 2021-11-10 MED ORDER — SODIUM CHLORIDE 0.9% IV SOLUTION: Freq: Once | INTRAVENOUS | Status: AC

## 2021-11-10 MED ORDER — MIDAZOLAM HCL 2 MG/2ML IJ SOLN
INTRAMUSCULAR | Status: AC | PRN
  Administered 2021-11-10: 1 mg via INTRAVENOUS
  Administered 2021-11-10 (×2): .5 mg via INTRAVENOUS

## 2021-11-10 MED ORDER — GLUCAGON HCL RDNA (DIAGNOSTIC) 1 MG IJ SOLR
INTRAMUSCULAR | Status: AC
  Filled 2021-11-10: qty 1

## 2021-11-10 MED ORDER — POTASSIUM CHLORIDE 10 MEQ/100ML IV SOLN
10.0000 meq | INTRAVENOUS | Status: AC
  Administered 2021-11-10 (×4): 10 meq via INTRAVENOUS
  Filled 2021-11-10 (×4): qty 100

## 2021-11-10 MED ORDER — FENTANYL CITRATE (PF) 100 MCG/2ML IJ SOLN
INTRAMUSCULAR | Status: AC | PRN
  Administered 2021-11-10 (×3): 25 ug via INTRAVENOUS

## 2021-11-10 MED ORDER — FENTANYL CITRATE (PF) 100 MCG/2ML IJ SOLN
INTRAMUSCULAR | Status: AC
  Filled 2021-11-10: qty 2

## 2021-11-10 MED ORDER — MIDAZOLAM HCL 2 MG/2ML IJ SOLN
INTRAMUSCULAR | Status: AC
  Filled 2021-11-10: qty 2

## 2021-11-10 MED ORDER — IOHEXOL 300 MG/ML  SOLN
100.0000 mL | Freq: Once | INTRAMUSCULAR | Status: AC | PRN
  Administered 2021-11-10: 15 mL

## 2021-11-10 NOTE — TOC Progression Note (Signed)
Transition of Care (TOC) - Progression Note    Patient Details  Name: Daniel Bailey. MRN: [id number] Date of Birth: [birth year]  Transition of Care [facility]) CM/SW [place], [place] Phone Number: 11/10/2021, 4:15 PM  Clinical Narrative:     CSW notified [facility] of anticipated DC tomorrow. CSW notified liaison of type of tube feeds (Jevity 1.5); [name] already has these feeds in-house so they will be ready to accept pt. Liaison informed CSW that [name] will arrange OP palliative; they just need DC summary to include that pt will follow up with OP palliative.   Expected Discharge Plan: [place] Barriers to Discharge: Continued Medical Work up  Expected Discharge Plan and Services Expected Discharge Plan: [place] arrangements for the past 2 months: [name]                                       Social Determinants of Health (SDOH) Interventions    Readmission Risk Interventions     No data to display

## 2021-11-10 NOTE — Progress Notes (Signed)
MEDICATION RELATED CONSULT NOTE -Post IR Procedure Consult - Anticoagulant/Antiplatelet PTA/Inpatient Med List Review by Pharmacist  No Known Allergies  Patient Measurements: Height: '5\' 6"'$  (167.6 cm) Weight: 92.6 kg (204 lb 2.3 oz) IBW/kg (Calculated) : 63.8   Vital Signs: Temp: 97.4 F (36.3 C) (07/06 1126) Temp Source: Oral (07/06 1126) BP: 114/64 (07/06 1621) Pulse Rate: 50 (07/06 1621) Intake/Output from previous day: 07/05 0701 - 07/06 0700 In: 236 [P.O.:236] Out: 1100 [Urine:1100] Intake/Output from this shift: No intake/output data recorded.  Labs: Recent Labs    11/08/21 0157 11/09/21 0221 11/10/21 0321  CREATININE 1.55* 1.28* 1.14   Estimated Creatinine Clearance: 63.3 mL/min (by C-G formula based on SCr of 1.14 mg/dL).    Medical History: Past Medical History:  Diagnosis Date   Anemia    Elevated PSA    Frequent urination    Glaucoma    History of COVID-19    Hyperglycemia    Hyperlipidemia    Hypertension    Sleep apnea     Medications:  Medications Prior to Admission  Medication Sig Dispense Refill Last Dose   acetaminophen (TYLENOL) 500 MG tablet Take 1,000 mg by mouth every 8 (eight) hours as needed for moderate pain.   unk last dose   ferrous sulfate 325 (65 FE) MG tablet Take 1 tablet (325 mg total) by mouth daily with breakfast.  3 11/04/2021   midodrine (PROAMATINE) 5 MG tablet Take 1 tablet (5 mg total) by mouth 3 (three) times daily with meals. (Patient taking differently: Take 5 mg by mouth daily.) 90 tablet 2 11/04/2021   pantoprazole (PROTONIX) 40 MG tablet Take 1 tablet (40 mg total) by mouth daily. 30 tablet 1 11/04/2021   Scheduled:   enoxaparin (LOVENOX) injection  40 mg Subcutaneous Q24H   ferrous sulfate  325 mg Oral Q breakfast   guaiFENesin  1,200 mg Oral BID   midodrine  5 mg Oral TID WC   pantoprazole  40 mg Oral Daily    Assessment:  Pharmacy consulted to resume anticoagulation or anti-platelet therapy post IR procedure.   S/p  biopsy of prostate on 11/10/21.  Procedure has standard bleeding risk. He was on enoxaparin for vte ppx preop, last given 7/5 0858,  then held 7/6.   Not on anticoagulation or anti-platelet prior to admission.  Standard bleeding risk.  Can resume LMWH prophylaxis dosing on day +1 AM.  Plan :  resume enox '40mg'$  sq daily in AM 7/7  Goal of Therapy:  Prevention of VTE  Plan:  Resume Enoxaparin '40mg'$  sq daily in AM tomorrow 11/11/21.   Nicole Cella, RPh Clinical Pharmacist  11/10/2021,4:44 PM Please check AMION for all [place] phone numbers After 10:00 PM, call [place] [phone]

## 2021-11-10 NOTE — Progress Notes (Signed)
PROGRESS NOTE  Daniel Bailey.  LZJ:[id number] DOB: Aug 15, 1950 DOA: 11/04/2021 PCP: [name], MD   Brief Narrative: Patient is a 71 year old male with history of intellectual disability, recurrent aspiration pneumonia, oropharyngeal dysphagia , on pured diet at the skilled nursing facility, anemia who was sent here for the evaluation of hypothermia, cough.  Patient was unable to provide history.  On reviewing his previous records, it was found that he had multiple episodes of aspiration pneumonia.  Patient was felt cold, lethargic, hypothermic, bradycardic.  On presentation he was hypothermic with temperature of 88.  Chest x-ray showed right lower lobe infiltrates.  Patient was admitted for the management of sepsis secondary to aspiration pneumonia.  Due to recurrent aspiration pneumonia, decision was made for PEG, IR consulted, plan for PEG today,possible dc tomorrow back to [facility]  Assessment & Plan:  Principal Problem:   Hypothermia Active Problems:   Aspiration pneumonia ([place])   Sepsis secondary to aspiration pneumonia: Presented with hypothermia, leukopenia.  Cultures have not shown any growth till date. Started on Unasyn,completed course.  Speech therapy evaluated him and  started on dysphagia 1 diet.   He has known history of oropharyngeal dysphagia/aspiration pneumonia and was on pured diet. The chest x-ray showed right lower lobe infiltrates, CT chest did not show any pneumonia.  CT chest showed  primarily lower lobe, bronchial wall thickening with adjacent hazy and reticular opacities, consistent with bronchitis and associated atelectasis. 4 mm sub solid nodules, left upper lobe and right lower lobe superior segment, not evident on the prior study. Non-contrast chest CT at 3-6 months is recommended.  Currently he is not in any respiratory distress, on room air. Family interested in PEG placement, IR consulted.  Agree with this plan.   AKI on CkD stage 2 /hypokalemia: Creatinine  bumped up to 1.8 today.  On reviewing his previous records, his baseline creatinine is around 1.3.-1.4 .  Started on IV fluids again.  Continue gentle IV fluids for today because he is NPO.  Potassium stable now  Hypothermia: Put on [facility].  Currently temperature normal .  Thyroid function test is normal.  Normocytic anemia/thrombocytopenia: Hemoglobin dropped to 8 most likely secondary to hemodilution.  On reviewing his previous records, his platelets has been fluctuating around 140-250.  Chronic intellectual impairment: Continue supportive care  Goals of care: CODE STATUS DNR.  Lives at a skilled nursing facility.  TOC consulted.  Very deconditioned, poor quality of life with recurrent aspiration.             DVT prophylaxis:enoxaparin (LOVENOX) injection 40 mg Start: 11/04/21 1645     Code Status: DNR  Family Communication: Called and discussed with legal guardian [name] on 7/4  Patient status:Inpatient  Patient is from :SNF  Anticipated discharge to:SNF  Estimated DC date: after PEG ,likely tomorrow   Consultants: None  Procedures:None  Antimicrobials:  Anti-infectives (From admission, onward)    Start     Dose/Rate Route Frequency Ordered Stop   11/10/21 0000  ceFAZolin (ANCEF) IVPB 2g/100 mL premix       Note to Pharmacy: Give in IR pre-Gtube.   2 g 200 mL/hr over 30 Minutes Intravenous  Once 11/09/21 2219 11/10/21 0129   11/04/21 1645  Ampicillin-Sulbactam (UNASYN) 3 g in sodium chloride 0.9 % 100 mL IVPB        3 g 200 mL/hr over 30 Minutes Intravenous Every 6 hours 11/04/21 1632 11/09/21 1959   11/04/21 1600  vancomycin (VANCOREADY) IVPB 1750 mg/350 mL  Status:  Discontinued        1,750 mg 175 mL/hr over 120 Minutes Intravenous  Once 11/04/21 1548 11/04/21 1628   11/04/21 1600  ceFEPIme (MAXIPIME) 2 g in sodium chloride 0.9 % 100 mL IVPB  Status:  Discontinued        2 g 200 mL/hr over 30 Minutes Intravenous  Once 11/04/21 1548 11/04/21 1628        Subjective: Patient seen and examined at bedside this morning.  Hemodynamically stable without any complaints.  Objective: Vitals:   11/10/21 0013 11/10/21 0343 11/10/21 0715 11/10/21 1126  BP: 126/68 (!) 117/56 116/63 (!) 117/59  Pulse: 60 (!) 52 (!) 53 (!) 50  Resp: '20 20 19 20  '$ Temp: 97.7 F (36.5 C) 97.6 F (36.4 C) (!) 97.4 F (36.3 C) (!) 97.4 F (36.3 C)  TempSrc: Oral Oral Oral Oral  SpO2: 98% 98% 98% 99%  Weight: 92.6 kg     Height:        Intake/Output Summary (Last 24 hours) at 11/10/2021 1152 Last data filed at 11/10/2021 0700 Gross per 24 hour  Intake 236 ml  Output 1100 ml  Net -864 ml   Filed Weights   11/08/21 0000 11/09/21 0000 11/10/21 0013  Weight: 89.5 kg 91.3 kg 92.6 kg    Examination:   General exam: Overall comfortable, not in distress, deconditioned, lying in bed HEENT: PERRL Respiratory system:  no wheezes or crackles  Cardiovascular system: S1 & S2 heard, RRR.  Gastrointestinal system: Abdomen is nondistended, soft and nontender. Central nervous system: Alert and awake Extremities: No edema, no clubbing ,no cyanosis Skin: No rashes, no ulcers,no icterus     Data Reviewed: I have personally reviewed following labs and imaging studies  CBC: Recent Labs  Lab 11/04/21 1340 11/05/21 0258 11/06/21 0444  WBC 3.8* 3.7* 10.2  NEUTROABS 2.9  --   --   HGB 10.9* 8.5* 8.0*  HCT 33.1* 26.0* 24.7*  MCV 95.1 94.5 93.9  PLT 141* 133* 416*   Basic Metabolic Panel: Recent Labs  Lab 11/07/21 0758 11/07/21 1550 11/08/21 0157 11/09/21 0221 11/10/21 0321  NA 141 145 145 144 144  K 3.4* 4.0 3.7 4.2 3.4*  CL 109 113* 114* 111 113*  CO2 '24 23 23 23 23  '$ GLUCOSE 100* 114* 109* 107* 111*  BUN 24* 29* 25* 19 16  CREATININE 1.64* 1.62* 1.55* 1.28* 1.14  CALCIUM 8.3* 8.4* 8.4* 8.6* 8.2*  MG  --  1.9  --   --   --      Recent Results (from the past 240 hour(s))  Urine Culture     Status: Abnormal   Collection Time: 11/04/21  1:29 PM    Specimen: In/Out Cath Urine  Result Value Ref Range Status   Specimen Description IN/OUT CATH URINE  Final   Special Requests   Final    NONE Performed at [facility], [address], [place], [place] [address]    Culture >=100,000 COLONIES/mL ENTEROCOCCUS FAECALIS (A)  Final   Report Status 11/07/2021 FINAL  Final   Organism ID, Bacteria ENTEROCOCCUS FAECALIS (A)  Final      Susceptibility   Enterococcus faecalis - MIC*    AMPICILLIN <=2 SENSITIVE Sensitive     NITROFURANTOIN <=16 SENSITIVE Sensitive     VANCOMYCIN 1 SENSITIVE Sensitive     * >=100,000 COLONIES/mL ENTEROCOCCUS FAECALIS  Blood Culture (routine x 2)     Status: None   Collection Time: 11/04/21  1:40 PM   Specimen: BLOOD  Result Value Ref Range Status   Specimen Description BLOOD BLOOD RIGHT WRIST  Final   Special Requests   Final    BOTTLES DRAWN AEROBIC AND ANAEROBIC Blood Culture adequate volume   Culture   Final    NO GROWTH 5 DAYS Performed at [facility], [address] [place], [place] [address]    Report Status 11/09/2021 FINAL  Final  Blood Culture (routine x 2)     Status: None   Collection Time: 11/04/21  1:53 PM   Specimen: BLOOD  Result Value Ref Range Status   Specimen Description BLOOD LEFT ANTECUBITAL  Final   Special Requests   Final    BOTTLES DRAWN AEROBIC AND ANAEROBIC Blood Culture adequate volume   Culture   Final    NO GROWTH 5 DAYS Performed at [facility], [place] [address] [place], [place] [address]    Report Status 11/09/2021 FINAL  Final     Radiology Studies: No results found.  Scheduled Meds:  enoxaparin (LOVENOX) injection  40 mg Subcutaneous Q24H   ferrous sulfate  325 mg Oral Q breakfast   guaiFENesin  1,200 mg Oral BID   midodrine  5 mg Oral TID WC   pantoprazole  40 mg Oral Daily   Continuous Infusions:  sodium chloride 75 mL/hr at 11/09/21 2147   potassium chloride 10 mEq (11/10/21 1124)     LOS: 6 days   Shelly Coss, MD [facility] P7/10/2021, 11:52 AM

## 2021-11-10 NOTE — Progress Notes (Signed)
Initial Nutrition Assessment  DOCUMENTATION CODES:   Not applicable  INTERVENTION:   Recommend:  Initiate tube feeding via PEG: Due to risk of aspiration recommend continuous TF Jevity 1.5 at 50 ml/h (1200 ml per day) Prosource TF 45 ml BID  Provides 1880 kcal, 98 gm protein, 912 ml free water daily   NUTRITION DIAGNOSIS:   Swallowing difficulty related to dysphagia as evidenced by  (chronic aspiration PNA).  GOAL:   Patient will meet greater than or equal to 90% of their needs  MONITOR:   PO intake  REASON FOR ASSESSMENT:    (NPO status)    ASSESSMENT:   Pt with PMH of intellectual disability, recurrent aspiration PNA, oropharyngeal dysphagia on pureed diet, anemia secondary to chronic illness who has been admitted 4 times in the last 6 months now admitted for recurrent aspiration PNA.   Palliative following, family has decided to proceed with PEG.  Per chart review pt consuming >75% of Dysphagia 1 with Thin liquids. SLP following.  Unsure of diet post PEG placement.    Pt lives at [facility] per notes.  Pt pleasant, unable to answer many questions. No family present. Pt edentulous.   Medications reviewed and include: ferrous sulfate, protonix KCl x 4   Labs reviewed: K: 3.4 A1C: 6.4 (Jan 2023)  NUTRITION - FOCUSED PHYSICAL EXAM:  Flowsheet Row Most Recent Value  Orbital Region No depletion  Upper Arm Region No depletion  Thoracic and Lumbar Region No depletion  Buccal Region No depletion  Temple Region Mild depletion  Clavicle Bone Region Mild depletion  Clavicle and Acromion Bone Region Mild depletion  Scapular Bone Region No depletion  Dorsal Hand Unable to assess  [edema]  Patellar Region No depletion  Anterior Thigh Region No depletion  Posterior Calf Region No depletion  Edema (RD Assessment) Mild  Hair Reviewed  Eyes Reviewed  Mouth Reviewed  [edentulous]  Skin Reviewed  Nails Reviewed       Diet Order:   Diet Order             Diet NPO  time specified  Diet effective midnight                   EDUCATION NEEDS:   Not appropriate for education at this time  Skin:  Skin Assessment: Reviewed RN Assessment  Last BM:  7/4  Height:   Ht Readings from Last 1 Encounters:  11/04/21 '5\' 6"'$  (1.676 m)    Weight:   Wt Readings from Last 1 Encounters:  11/10/21 92.6 kg    BMI:  Body mass index is 32.95 kg/m.  Estimated Nutritional Needs:   Kcal:  1800-2000  Protein:  90-100 grams  Fluid:  >1.8 L/day  Lockie Pares., RD, LDN, CNSC See AMiON for contact information

## 2021-11-10 NOTE — Procedures (Signed)
Interventional Radiology Procedure Note  Procedure: Percutaneous Gastrostomy tube placement  Indication: Dysphagia. Recurrent Pneumonia.  Findings: Please refer to procedural dictation for full description.  Complications: None  EBL: < 10 mL  Miachel Roux, MD [phone]

## 2021-11-10 NOTE — Progress Notes (Signed)
SLP Cancellation Note  Patient Details Name: Daniel Bailey. MRN: [id number] DOB: Mar 07, 1951   Cancelled treatment:       Reason Eval/Treat Not Completed: Medical issues which prohibited therapy. Note that after palliative care consult, pt's family has chosen to proceed with PEG, for which he is currently NPO.     Osie Bond., M.A. [place] Office [phone]  Secure chat preferred  11/10/2021, 8:34 AM

## 2021-11-10 NOTE — Progress Notes (Signed)
   11/10/21 1724  Vitals  Temp (!) 93.5 F (34.2 C)  Temp Source Rectal  BP 100/66  MAP (mmHg) 78  BP Location Right Arm  BP Method Automatic  Patient Position (if appropriate) Lying  Pulse Rate (!) 45  Pulse Rate Source Monitor  Resp 18  Oxygen Therapy  SpO2 96 %  O2 Device Nasal Cannula  O2 Flow Rate (L/min) 2 L/min  Pain Assessment  Pain Scale 0-10  Pain Score 0  Glasgow Coma Scale  Eye Opening 4  Best Verbal Response (NON-intubated) 5  Best Motor Response 6  Glasgow Coma Scale Score 15  Provider Notification  Provider Name/Title Adhikari MD  Date Provider Notified 11/10/21  Time Provider Notified 9509  Method of Notification Page  Notification Reason Other (Comment) (temp drop)  Provider response Other (Comment);At bedside Patent attorney. Rapid response.MD notified.)  Date of Provider Response 11/10/21  Time of Provider Response 1750  Rapid Response Notification  Name of Rapid Response RN Notified Hella RN  Date Rapid Response Notified 11/10/21  Time Rapid Response Notified 3267   Family at bedside.  Retail banker in place.

## 2021-11-11 DIAGNOSIS — M79674 Pain in right toe(s): Secondary | ICD-10-CM | POA: Diagnosis not present

## 2021-11-11 DIAGNOSIS — R2681 Unsteadiness on feet: Secondary | ICD-10-CM | POA: Diagnosis not present

## 2021-11-11 DIAGNOSIS — M79675 Pain in left toe(s): Secondary | ICD-10-CM | POA: Diagnosis not present

## 2021-11-11 DIAGNOSIS — B351 Tinea unguium: Secondary | ICD-10-CM | POA: Diagnosis not present

## 2021-11-11 DIAGNOSIS — D649 Anemia, unspecified: Secondary | ICD-10-CM | POA: Diagnosis not present

## 2021-11-11 DIAGNOSIS — R634 Abnormal weight loss: Secondary | ICD-10-CM | POA: Diagnosis not present

## 2021-11-11 DIAGNOSIS — F4329 Adjustment disorder with other symptoms: Secondary | ICD-10-CM | POA: Diagnosis not present

## 2021-11-11 DIAGNOSIS — I4510 Unspecified right bundle-branch block: Secondary | ICD-10-CM | POA: Diagnosis not present

## 2021-11-11 DIAGNOSIS — L600 Ingrowing nail: Secondary | ICD-10-CM | POA: Diagnosis not present

## 2021-11-11 DIAGNOSIS — Z8616 Personal history of COVID-19: Secondary | ICD-10-CM | POA: Diagnosis not present

## 2021-11-11 DIAGNOSIS — E782 Mixed hyperlipidemia: Secondary | ICD-10-CM | POA: Diagnosis not present

## 2021-11-11 DIAGNOSIS — Z7401 Bed confinement status: Secondary | ICD-10-CM | POA: Diagnosis not present

## 2021-11-11 DIAGNOSIS — T68XXXA Hypothermia, initial encounter: Secondary | ICD-10-CM | POA: Diagnosis not present

## 2021-11-11 DIAGNOSIS — R680 Hypothermia, not associated with low environmental temperature: Secondary | ICD-10-CM | POA: Diagnosis not present

## 2021-11-11 DIAGNOSIS — D508 Other iron deficiency anemias: Secondary | ICD-10-CM | POA: Diagnosis not present

## 2021-11-11 DIAGNOSIS — N1831 Chronic kidney disease, stage 3a: Secondary | ICD-10-CM | POA: Diagnosis not present

## 2021-11-11 DIAGNOSIS — R6521 Severe sepsis with septic shock: Secondary | ICD-10-CM | POA: Diagnosis not present

## 2021-11-11 DIAGNOSIS — N182 Chronic kidney disease, stage 2 (mild): Secondary | ICD-10-CM | POA: Diagnosis not present

## 2021-11-11 DIAGNOSIS — J690 Pneumonitis due to inhalation of food and vomit: Secondary | ICD-10-CM | POA: Diagnosis not present

## 2021-11-11 DIAGNOSIS — G9340 Encephalopathy, unspecified: Secondary | ICD-10-CM | POA: Diagnosis not present

## 2021-11-11 DIAGNOSIS — D696 Thrombocytopenia, unspecified: Secondary | ICD-10-CM | POA: Diagnosis not present

## 2021-11-11 DIAGNOSIS — M6281 Muscle weakness (generalized): Secondary | ICD-10-CM | POA: Diagnosis not present

## 2021-11-11 DIAGNOSIS — G4733 Obstructive sleep apnea (adult) (pediatric): Secondary | ICD-10-CM | POA: Diagnosis not present

## 2021-11-11 DIAGNOSIS — R001 Bradycardia, unspecified: Secondary | ICD-10-CM | POA: Diagnosis not present

## 2021-11-11 DIAGNOSIS — I517 Cardiomegaly: Secondary | ICD-10-CM | POA: Diagnosis not present

## 2021-11-11 DIAGNOSIS — R1312 Dysphagia, oropharyngeal phase: Secondary | ICD-10-CM | POA: Diagnosis not present

## 2021-11-11 DIAGNOSIS — Z931 Gastrostomy status: Secondary | ICD-10-CM | POA: Diagnosis not present

## 2021-11-11 DIAGNOSIS — K219 Gastro-esophageal reflux disease without esophagitis: Secondary | ICD-10-CM | POA: Diagnosis not present

## 2021-11-11 DIAGNOSIS — E119 Type 2 diabetes mellitus without complications: Secondary | ICD-10-CM | POA: Diagnosis not present

## 2021-11-11 DIAGNOSIS — E1169 Type 2 diabetes mellitus with other specified complication: Secondary | ICD-10-CM | POA: Diagnosis not present

## 2021-11-11 DIAGNOSIS — I9589 Other hypotension: Secondary | ICD-10-CM | POA: Diagnosis not present

## 2021-11-11 DIAGNOSIS — R6520 Severe sepsis without septic shock: Secondary | ICD-10-CM | POA: Diagnosis not present

## 2021-11-11 DIAGNOSIS — E1122 Type 2 diabetes mellitus with diabetic chronic kidney disease: Secondary | ICD-10-CM | POA: Diagnosis not present

## 2021-11-11 DIAGNOSIS — I959 Hypotension, unspecified: Secondary | ICD-10-CM | POA: Diagnosis not present

## 2021-11-11 DIAGNOSIS — N1832 Chronic kidney disease, stage 3b: Secondary | ICD-10-CM | POA: Diagnosis not present

## 2021-11-11 LAB — BASIC METABOLIC PANEL
Anion gap: 12 (ref 5–15)
BUN: 12 mg/dL (ref 8–23)
CO2: 21 mmol/L — ABNORMAL LOW (ref 22–32)
Calcium: 8.2 mg/dL — ABNORMAL LOW (ref 8.9–10.3)
Chloride: 108 mmol/L (ref 98–111)
Creatinine, Ser: 1.26 mg/dL — ABNORMAL HIGH (ref 0.61–1.24)
GFR, Estimated: >60 mL/min (ref >60)
Glucose, Bld: 83 mg/dL (ref 70–99)
Potassium: 3.4 mmol/L — ABNORMAL LOW (ref 3.5–5.1)
Sodium: 141 mmol/L (ref 135–145)

## 2021-11-11 LAB — TYPE AND SCREEN
ABO/RH(D): AB NEG
Antibody Screen: NEGATIVE
Unit division: 0

## 2021-11-11 LAB — CBC
HCT: 25.2 % — ABNORMAL LOW (ref 39.0–52.0)
Hemoglobin: 8.3 g/dL — ABNORMAL LOW (ref 13.0–17.0)
MCH: 31.2 pg (ref 26.0–34.0)
MCHC: 32.9 g/dL (ref 30.0–36.0)
MCV: 94.7 fL (ref 80.0–100.0)
Platelets: 239 10*3/uL (ref 150–400)
RBC: 2.66 MIL/uL — ABNORMAL LOW (ref 4.22–5.81)
RDW: 17.7 % — ABNORMAL HIGH (ref 11.5–15.5)
WBC: 6.3 10*3/uL (ref 4.0–10.5)
nRBC: 0 % (ref 0.0–0.2)

## 2021-11-11 LAB — BPAM RBC
Blood Product Expiration Date: 202307122359
ISSUE DATE / TIME: 202307062157
Unit Type and Rh: 1700

## 2021-11-11 MED ORDER — POTASSIUM CHLORIDE 20 MEQ PO PACK: 40.0000 meq | PACK | Freq: Every day | ORAL | 0 refills | Status: DC

## 2021-11-11 MED ORDER — PROSOURCE TF PO LIQD: 45.0000 mL | Freq: Two times a day (BID) | ORAL | Status: DC

## 2021-11-11 MED ORDER — PANTOPRAZOLE SODIUM 40 MG PO PACK: 40.0000 mg | PACK | Freq: Every day | ORAL | Status: DC

## 2021-11-11 MED ORDER — FERROUS SULFATE 300 (60 FE) MG/5ML PO SYRP
300.0000 mg | ORAL_SOLUTION | Freq: Every day | ORAL | Status: DC
Start: 2021-11-12 — End: 2021-11-12
  Filled 2021-11-11: qty 5

## 2021-11-11 MED ORDER — FREE WATER: 250.0000 mL | Freq: Four times a day (QID) | Status: DC

## 2021-11-11 MED ORDER — POTASSIUM CHLORIDE 10 MEQ/100ML IV SOLN
10.0000 meq | INTRAVENOUS | Status: AC
  Administered 2021-11-11 (×4): 10 meq via INTRAVENOUS
  Filled 2021-11-11 (×4): qty 100

## 2021-11-11 MED ORDER — PANTOPRAZOLE 2 MG/ML SUSPENSION: 40.0000 mg | Freq: Every day | ORAL | Status: DC

## 2021-11-11 MED ORDER — JEVITY 1.5 CAL/FIBER PO LIQD: 1000.0000 mL | ORAL | Status: DC

## 2021-11-11 MED ORDER — GUAIFENESIN 100 MG/5ML PO LIQD
10.0000 mL | Freq: Four times a day (QID) | ORAL | Status: DC
  Administered 2021-11-11 (×2): 10 mL
  Filled 2021-11-11 (×2): qty 10

## 2021-11-11 MED ORDER — ACETAMINOPHEN 500 MG PO TABS: 1000.0000 mg | ORAL_TABLET | Freq: Three times a day (TID) | ORAL | 0 refills | Status: DC | PRN

## 2021-11-11 MED ORDER — MIDODRINE HCL 5 MG PO TABS
5.0000 mg | ORAL_TABLET | Freq: Three times a day (TID) | ORAL | Status: DC
Start: 2021-11-11 — End: 2021-11-12
  Administered 2021-11-11 (×2): 5 mg
  Filled 2021-11-11 (×2): qty 1

## 2021-11-11 MED ORDER — FERROUS SULFATE 300 (60 FE) MG/5ML PO SYRP: 300.0000 mg | ORAL_SOLUTION | Freq: Every day | ORAL | 3 refills | Status: DC

## 2021-11-11 MED ORDER — MIDODRINE HCL 5 MG PO TABS: 5.0000 mg | ORAL_TABLET | Freq: Three times a day (TID) | ORAL | 2 refills | Status: DC

## 2021-11-11 MED ORDER — FREE WATER
250.0000 mL | Freq: Four times a day (QID) | Status: DC
  Administered 2021-11-11 (×2): 250 mL

## 2021-11-11 MED ORDER — PROSOURCE TF PO LIQD
45.0000 mL | Freq: Two times a day (BID) | ORAL | Status: DC
  Administered 2021-11-11: 45 mL
  Filled 2021-11-11: qty 45

## 2021-11-11 MED ORDER — JEVITY 1.5 CAL/FIBER PO LIQD
1000.0000 mL | ORAL | Status: DC
  Administered 2021-11-11: 1000 mL
  Filled 2021-11-11 (×2): qty 1000

## 2021-11-11 NOTE — Progress Notes (Signed)
Referring Physician(s): Dr. Tawanna Solo  Supervising Physician: Sandi Mariscal  Patient Status:  [facility] - In-pt  Chief Complaint: Aspiration pneumonia  Subjective: Patient resting comfortably. Denies abdominal pain.  G-tube in place.  Already in use.   Allergies: Patient has no known allergies.  Medications: Prior to Admission medications   Medication Sig Start Date End Date Taking? Authorizing Provider  ferrous sulfate 325 (65 FE) MG tablet Take 1 tablet (325 mg total) by mouth daily with breakfast. 09/24/21  Yes Angiulli, Lavon Paganini, PA-C  pantoprazole (PROTONIX) 40 MG tablet Take 1 tablet (40 mg total) by mouth daily. 07/19/21  Yes Hongalgi, Lenis Dickinson, MD  potassium chloride (KLOR-CON) 20 MEQ packet Place 40 mEq into feeding tube daily for 3 days. 11/11/21 11/14/21 Yes Shelly Coss, MD  acetaminophen (TYLENOL) 500 MG tablet Place 2 tablets (1,000 mg total) into feeding tube every 8 (eight) hours as needed for moderate pain. 11/11/21   Shelly Coss, MD  ferrous sulfate 300 (60 Fe) MG/5ML syrup Place 5 mLs (300 mg total) into feeding tube daily with breakfast. 11/12/21   Shelly Coss, MD  midodrine (PROAMATINE) 5 MG tablet Place 1 tablet (5 mg total) into feeding tube 3 (three) times daily with meals. 11/11/21   Shelly Coss, MD  Nutritional Supplements (FEEDING SUPPLEMENT, JEVITY 1.5 CAL/FIBER,) LIQD Place 1,000 mLs into feeding tube continuous. 11/11/21   Shelly Coss, MD  Nutritional Supplements (FEEDING SUPPLEMENT, PROSOURCE TF,) liquid Place 45 mLs into feeding tube 2 (two) times daily. 11/11/21   Shelly Coss, MD  pantoprazole sodium (PROTONIX) 40 mg Place 40 mg into feeding tube daily. 11/11/21   Shelly Coss, MD     Vital Signs: BP 120/60   Pulse (!) 52   Temp 97.6 F (36.4 C) (Oral)   Resp 16   Ht '5\' 6"'$  (1.676 m)   Wt 204 lb 5.9 oz (92.7 kg)   SpO2 94%   BMI 32.99 kg/m   Physical Exam NAD, alert Abdomen: G-tube in place.  Insertion site with old dried blood.  No  oozing, no leaking. Non-tender.   Imaging: IR GASTROSTOMY TUBE MOD SED  Result Date: 11/10/2021 INDICATION: 71 year old gentleman with history of recurrent aspiration pneumonia and dysphagia presents to IR for percutaneous gastrostomy tube placement. EXAM: Percutaneous gastrostomy tube placement MEDICATIONS: Ancef 2 g IV; Antibiotics were administered within 1 hour of the procedure. Glucagon 1 mg IV ANESTHESIA/SEDATION: Moderate (conscious) sedation was employed during this procedure. A total of Versed 2 mg and Fentanyl 75 mcg was administered intravenously by the radiology nurse. Total intra-service moderate Sedation Time: 21 minutes. The patient's level of consciousness and vital signs were monitored continuously by radiology nursing throughout the procedure under my direct supervision. CONTRAST:  15 mL of Omnipaque 300-administered into the gastric lumen. FLUOROSCOPY: Radiation Exposure Index (as provided by the fluoroscopic device): 45 mGy Kerma COMPLICATIONS: None immediate. PROCEDURE: Informed written consent was obtained from the patient's/patient representative after a thorough discussion of the procedural risks, benefits and alternatives. All questions were addressed. Maximal Sterile Barrier Technique was utilized including caps, mask, sterile gowns, sterile gloves, sterile drape, hand hygiene and skin antiseptic. A timeout was performed prior to the initiation of the procedure. An orogastric tube was placed with fluoroscopic guidance. The anterior abdomen was prepped and draped in sterile fashion. Ultrasound evaluation of the left upper quadrant was performed to confirm the position of the liver. The skin and subcutaneous tissues were anesthetized with 1% lidocaine. Two gastropexy were placed utilizing fluoroscopic guidance. [place]  gauge needle was directed into the distended stomach with fluoroscopic guidance. A wire was advanced into the stomach. 9-French vascular sheath was placed and the orogastric  tube was snared using a [place] snare device. The orogastric tube and snare were pulled out of the patient's mouth. The snare device was connected to a 20-French gastrostomy tube. The snare device and gastrostomy tube were pulled through the patient's mouth and out the anterior abdominal wall. The gastrostomy tube was cut to an appropriate length. Contrast injection through gastrostomy tube confirmed placement within the stomach. Fluoroscopic images were obtained for documentation. The gastrostomy tube was flushed with normal saline. IMPRESSION: 20 French percutaneous gastrostomy tube placement as above. Electronically Signed   By: Miachel Roux M.D.   On: 11/10/2021 16:33    Labs:  CBC: Recent Labs    11/05/21 0258 11/06/21 0444 11/10/21 1817 11/11/21 0354  WBC 3.7* 10.2 6.8 6.3  HGB 8.5* 8.0* 7.1* 8.3*  HCT 26.0* 24.7* 22.0* 25.2*  PLT 133* 113* 182 239    COAGS: Recent Labs    07/12/21 0635 11/04/21 1340  INR 1.0 0.9  APTT  --  42*    BMP: Recent Labs    11/08/21 0157 11/09/21 0221 11/10/21 0321 11/11/21 0354  NA 145 144 144 141  K 3.7 4.2 3.4* 3.4*  CL 114* 111 113* 108  CO2 '23 23 23 '$ 21*  GLUCOSE 109* 107* 111* 83  BUN 25* '19 16 12  '$ CALCIUM 8.4* 8.6* 8.2* 8.2*  CREATININE 1.55* 1.28* 1.14 1.26*  GFRNONAA 48* 60* >60 >60    LIVER FUNCTION TESTS: Recent Labs    07/25/21 1046 09/02/21 1517 09/08/21 0459 11/04/21 1340  BILITOT 0.3 0.6 0.4 0.6  AST '27 29 23 29  '$ ALT '25 15 15 23  '$ ALKPHOS 81 96 82 151*  PROT 6.9 6.9 5.5* 6.9  ALBUMIN 3.9 3.1* 2.3* 3.1*    Assessment and Plan: Aspiration pneumonia s/p G-tube placement 7/6 by Dr. Dwaine Gale. Patient assessed at bedside after G-tube placement yesterday.  Tube currently in use without issue.  Patient planning to d/c home today.  Site intact.  Small amount of old dried blood.   Continue routine use and tube care.   Electronically Signed: Docia Barrier, PA 11/11/2021, 4:05 PM   I spent a total of 15  Minutes at the the patient's bedside AND on the patient's hospital floor or unit, greater than 50% of which was counseling/coordinating care for aspiration pneumonia.

## 2021-11-11 NOTE — Progress Notes (Addendum)
Nutrition Brief Note  Consult received to start TF via PEG.   7/6 s/p PEG placement; full nutrition assessment completed  Initiate tube feeding via PEG: Jevity 1.5 at 20 ml/h and increase by 10 ml every 4 hours to goal rate of 50 ml/h (1200 ml per day) Prosource TF 45 ml BID  Provides 1880 kcal, 98 gm protein, 912 ml free water daily  Above will meet 100% of nutrition needs  250 ml free water every 6 hours Total free water 1968 ml   Dominque Marlin P., RD, LDN, CNSC See AMiON for contact information

## 2021-11-11 NOTE — Care Management Important Message (Signed)
Important Message  Patient Details  Name: Daniel Bailey. MRN: [id number] Date of Birth: 07-09-1950   Medicare Important Message Given:  Yes     Shelda Altes 11/11/2021, 9:40 AM

## 2021-11-11 NOTE — Discharge Summary (Addendum)
Physician Discharge Summary  Daniel Bailey. WGN:[id number] DOB: 1950/07/13 DOA: 11/04/2021  PCP: [name], MD  Admit date: 11/04/2021 Discharge date: 11/11/2021  Admitted From: Home Disposition:  Home  Discharge Condition:Stable CODE STATUS:DNR Diet recommendation: Tube feeding, dysphagia 1 diet  Brief/Interim Summary:  Patient is a 71 year old male with history of intellectual disability, recurrent aspiration pneumonia, oropharyngeal dysphagia , on pured diet at the skilled nursing facility, anemia who was sent here for the evaluation of hypothermia, cough.  Patient was unable to provide history.  On reviewing his previous records, it was found that he had multiple episodes of aspiration pneumonia.  Patient was felt cold, lethargic, hypothermic, bradycardic.  On presentation he was hypothermic with temperature of 88.  Chest x-ray showed right lower lobe infiltrates.  Patient was admitted for the management of sepsis secondary to aspiration pneumonia.  Due to recurrent aspiration pneumonia, decision was made for PEG, IR consulted, underwent PEG placement without any complication.  He can be discharged to skilled nursing facility today.  We recommend to minimize oral intake as much as possible and use PEG for feeding.  Following problems were addressed during her last hospitalization:  Sepsis secondary to aspiration pneumonia: Presented with hypothermia, leukopenia.  Cultures have not shown any growth till date. Started on Unasyn,completed course.  Speech therapy evaluated him and  started on dysphagia 1 diet.   He has known history of oropharyngeal dysphagia/aspiration pneumonia and was on pured diet. The chest x-ray showed right lower lobe infiltrates, CT chest did not show any pneumonia.  CT chest showed  primarily lower lobe, bronchial wall thickening with adjacent hazy and reticular opacities, consistent with bronchitis and associated atelectasis. 4 mm sub solid nodules, left upper lobe  and right lower lobe superior segment, not evident on the prior study. Non-contrast chest CT at 3-6 months is recommended.  Currently he is not in any respiratory distress, on room air. Family interested in PEG placement, IR consulted.  PEG placed.    AKI on CkD stage 2 /hypokalemia: Creatinine bumped up to 1.8.  On reviewing his previous records, his baseline creatinine is around 1.3.-1.4 .  Started on IV fluids again with improvement    Hypothermia: Put on [facility].  Currently temperature normal .  Thyroid function test is normal.   Normocytic anemia/thrombocytopenia: Hemoglobin dropped to 7-8 most likely secondary to hemodilution.  On reviewing his previous records, his platelets has been fluctuating around 140-250.  Continue oral supplementation   Chronic intellectual impairment: Continue supportive care   Goals of care: CODE STATUS DNR.  Lives at a skilled nursing facility.  TOC consulted.  Very deconditioned, poor quality of life with recurrent aspiration.  Palliative  care following here   Discharge Diagnoses:  Principal Problem:   Hypothermia Active Problems:   Aspiration pneumonia [facility])    Discharge Instructions  Discharge Instructions     Diet general   Complete by: As directed    Tube feeding Dysphagia 1: please minimise oral intake   Discharge instructions   Complete by: As directed    1)Please take your medications as instructed   Increase activity slowly   Complete by: As directed       Allergies as of 11/11/2021   No Known Allergies      Medication List     STOP taking these medications    ferrous sulfate 325 (65 FE) MG tablet Replaced by: ferrous sulfate 300 (60 Fe) MG/5ML syrup   pantoprazole 40 MG tablet Commonly known  as: PROTONIX Replaced by: pantoprazole sodium 40 mg       TAKE these medications    acetaminophen 500 MG tablet Commonly known as: TYLENOL Place 2 tablets (1,000 mg total) into feeding tube every 8 (eight) hours as  needed for moderate pain. What changed: how to take this   feeding supplement (PROSource TF) liquid Place 45 mLs into feeding tube 2 (two) times daily.   feeding supplement (JEVITY 1.5 CAL/FIBER) Liqd Place 1,000 mLs into feeding tube continuous.   ferrous sulfate 300 (60 Fe) MG/5ML syrup Place 5 mLs (300 mg total) into feeding tube daily with breakfast. Start taking on: November 12, 2021 Replaces: ferrous sulfate 325 (65 FE) MG tablet   free water Soln Place 250 mLs into feeding tube every 6 (six) hours. Start taking on: November 12, 2021   midodrine 5 MG tablet Commonly known as: PROAMATINE Place 1 tablet (5 mg total) into feeding tube 3 (three) times daily with meals. What changed: how to take this   pantoprazole sodium 40 mg Commonly known as: PROTONIX Place 40 mg into feeding tube daily. Replaces: pantoprazole 40 MG tablet   potassium chloride 20 MEQ packet Commonly known as: Klor-Con Place 40 mEq into feeding tube daily for 3 days.        Follow-up Information     Tower, Wynelle Fanny, MD. Schedule an appointment as soon as possible for a visit in 1 week(s).   Specialties: Family Medicine, Radiology Contact information: [address] [place] [address] [phone]                No Known Allergies  Consultations: IR, palliative care   Procedures/Studies: IR GASTROSTOMY TUBE MOD SED  Result Date: 11/10/2021 INDICATION: 71 year old gentleman with history of recurrent aspiration pneumonia and dysphagia presents to IR for percutaneous gastrostomy tube placement. EXAM: Percutaneous gastrostomy tube placement MEDICATIONS: Ancef 2 g IV; Antibiotics were administered within 1 hour of the procedure. Glucagon 1 mg IV ANESTHESIA/SEDATION: Moderate (conscious) sedation was employed during this procedure. A total of Versed 2 mg and Fentanyl 75 mcg was administered intravenously by the radiology nurse. Total intra-service moderate Sedation Time: 21 minutes. The  patient's level of consciousness and vital signs were monitored continuously by radiology nursing throughout the procedure under my direct supervision. CONTRAST:  15 mL of Omnipaque 300-administered into the gastric lumen. FLUOROSCOPY: Radiation Exposure Index (as provided by the fluoroscopic device): 45 mGy Kerma COMPLICATIONS: None immediate. PROCEDURE: Informed written consent was obtained from the patient's/patient representative after a thorough discussion of the procedural risks, benefits and alternatives. All questions were addressed. Maximal Sterile Barrier Technique was utilized including caps, mask, sterile gowns, sterile gloves, sterile drape, hand hygiene and skin antiseptic. A timeout was performed prior to the initiation of the procedure. An orogastric tube was placed with fluoroscopic guidance. The anterior abdomen was prepped and draped in sterile fashion. Ultrasound evaluation of the left upper quadrant was performed to confirm the position of the liver. The skin and subcutaneous tissues were anesthetized with 1% lidocaine. Two gastropexy were placed utilizing fluoroscopic guidance. 17 gauge needle was directed into the distended stomach with fluoroscopic guidance. A wire was advanced into the stomach. 9-French vascular sheath was placed and the orogastric tube was snared using a Gooseneck snare device. The orogastric tube and snare were pulled out of the patient's mouth. The snare device was connected to a 20-French gastrostomy tube. The snare device and gastrostomy tube were pulled through the patient's mouth and out the  anterior abdominal wall. The gastrostomy tube was cut to an appropriate length. Contrast injection through gastrostomy tube confirmed placement within the stomach. Fluoroscopic images were obtained for documentation. The gastrostomy tube was flushed with normal saline. IMPRESSION: 20 French percutaneous gastrostomy tube placement as above. Electronically Signed   By: Miachel Roux  M.D.   On: 11/10/2021 16:33   US RENAL  Result Date: 11/05/2021 CLINICAL DATA:  Acute kidney injury. EXAM: RENAL / URINARY TRACT ULTRASOUND COMPLETE COMPARISON:  None Available. FINDINGS: Right Kidney: Renal measurements: 11.1 x 4.3 x 5.2 cm = volume: 122.1 mL. Echogenicity within normal limits. No mass or hydronephrosis visualized. Left Kidney: Renal measurements: 11.0 x 5.6 x 4.6 cm = volume: 142 mL. Echogenicity within normal limits. No mass or hydronephrosis visualized. Bladder: Decompressed, not visualized. Other: None. IMPRESSION: 1. Normal renal ultrasound. Electronically Signed   By: Lajean Manes M.D.   On: 11/05/2021 10:22   CT CHEST WO CONTRAST  Result Date: 11/04/2021 CLINICAL DATA:  Fatigue.  Complicated pneumonia suspected. EXAM: CT CHEST WITHOUT CONTRAST TECHNIQUE: Multidetector CT imaging of the chest was performed following the standard protocol without IV contrast. RADIATION DOSE REDUCTION: This exam was performed according to the departmental dose-optimization program which includes automated exposure control, adjustment of the mA and/or kV according to patient size and/or use of iterative reconstruction technique. COMPARISON:  Current and prior chest radiographs. Prior chest CT dated 07/12/2021. FINDINGS: Cardiovascular: Heart normal in size and configuration. No pericardial effusion or coronary artery calcifications. Mild descending thoracic aorta atherosclerotic calcifications. Mediastinum/Nodes: No enlarged mediastinal or axillary lymph nodes. Thyroid gland, trachea, and esophagus demonstrate no significant findings. Lungs/Pleura: Ill-defined sub solid nodule, 4 mm, left upper lobe, image 65, series 8. Similar appearing and similar-sized sub solid nodule in the superior segment of the right lower lobe, image 74, series 8. These were not present on the prior chest, abdomen and pelvis CT. Bronchial wall thickening with adjacent peribronchovascular reticular and hazy opacities in both  lower lobes, and minimally at the base of the left upper lobe lingula. Remainder of the lungs is clear. No pleural effusion or pneumothorax. Upper Abdomen: No acute abnormality. Musculoskeletal: No fracture or acute finding. No bone lesion. No chest wall mass. Right-sided gynecomastia. IMPRESSION: 1. No evidence of pneumonia. 2. Lung base, primarily lower lobe, bronchial wall thickening with adjacent hazy and reticular opacities. Findings consistent with bronchitis and associated atelectasis. Bronchitis may be acute or chronic. Consolidation noted in base of the left lower lobe on the prior CT has resolved. 3. 4 mm sub solid nodules, left upper lobe and right lower lobe superior segment, not evident on the prior study. Non-contrast chest CT at 3-6 months is recommended. If nodules persist and are stable at that time, consider additional non-contrast chest CT examinations at 2 and 4 years. This recommendation follows the consensus statement: Guidelines for Management of Incidental Pulmonary Nodules Detected on CT Images: From the Fleischner Society 2017; Radiology 2017; 284:228-243. 4. Mild aortic atherosclerosis. Aortic Atherosclerosis (ICD10-I70.0). Electronically Signed   By: Lajean Manes M.D.   On: 11/04/2021 18:14   DG Chest Port 1 View  Result Date: 11/04/2021 CLINICAL DATA:  Sepsis. EXAM: PORTABLE CHEST 1 VIEW COMPARISON:  09/14/2021; 09/02/2021 FINDINGS: Grossly unchanged cardiac silhouette and mediastinal contours. Unchanged bibasilar heterogeneous opacities. Redemonstrated biapical pleuroparenchymal thickening. No new focal airspace opacities. No pleural effusion or pneumothorax. No evidence of edema. No acute osseous abnormalities. IMPRESSION: Similar appearing bibasilar atelectasis without superimposed acute cardiopulmonary disease on this AP portable examination.  Further evaluation with a PA and lateral chest radiograph may be obtained as clinically indicated. Electronically Signed   By: Sandi Mariscal  M.D.   On: 11/04/2021 14:06      Subjective: Patient seen and examined at the bedside this morning.  Hemodynamically stable.  Started on tube diet today.  Hemodynamically stable for discharge.Called and discussed with cousin about dc planning  Discharge Exam: Vitals:   11/11/21 1117 11/11/21 1544  BP: (!) 110/59 120/60  Pulse: (!) 55 (!) 52  Resp: 17 16  Temp: 97.8 F (36.6 C) 97.6 F (36.4 C)  SpO2: 90% 94%   Vitals:   11/11/21 0445 11/11/21 0726 11/11/21 1117 11/11/21 1544  BP: (!) 109/58 (!) 116/59 (!) 110/59 120/60  Pulse: 69 65 (!) 55 (!) 52  Resp: (!) '24 19 17 16  '$ Temp: 98.3 F (36.8 C) 98 F (36.7 C) 97.8 F (36.6 C) 97.6 F (36.4 C)  TempSrc: Oral Oral Oral Oral  SpO2: 98% 94% 90% 94%  Weight: 92.7 kg     Height:        General: Pt is alert, awake, not in acute distress Cardiovascular: RRR, S1/S2 +, no rubs, no gallops Respiratory: CTA bilaterally, no wheezing, no rhonchi Abdominal: Soft, NT, ND, bowel sounds +,PEG Extremities: no edema, no cyanosis    The results of significant diagnostics from this hospitalization (including imaging, microbiology, ancillary and laboratory) are listed below for reference.     Microbiology: Recent Results (from the past 240 hour(s))  Urine Culture     Status: Abnormal   Collection Time: 11/04/21  1:29 PM   Specimen: In/Out Cath Urine  Result Value Ref Range Status   Specimen Description IN/OUT CATH URINE  Final   Special Requests   Final    NONE Performed at [facility], [address], [place], [place] [address]    Culture >=100,000 COLONIES/mL ENTEROCOCCUS FAECALIS (A)  Final   Report Status 11/07/2021 FINAL  Final   Organism ID, Bacteria ENTEROCOCCUS FAECALIS (A)  Final      Susceptibility   Enterococcus faecalis - MIC*    AMPICILLIN <=2 SENSITIVE Sensitive     NITROFURANTOIN <=16 SENSITIVE Sensitive     VANCOMYCIN 1 SENSITIVE Sensitive     * >=100,000 COLONIES/mL ENTEROCOCCUS FAECALIS  Blood Culture  (routine x 2)     Status: None   Collection Time: 11/04/21  1:40 PM   Specimen: BLOOD  Result Value Ref Range Status   Specimen Description BLOOD BLOOD RIGHT WRIST  Final   Special Requests   Final    BOTTLES DRAWN AEROBIC AND ANAEROBIC Blood Culture adequate volume   Culture   Final    NO GROWTH 5 DAYS Performed at [facility], [address] [place], [place] [address]    Report Status 11/09/2021 FINAL  Final  Blood Culture (routine x 2)     Status: None   Collection Time: 11/04/21  1:53 PM   Specimen: BLOOD  Result Value Ref Range Status   Specimen Description BLOOD LEFT ANTECUBITAL  Final   Special Requests   Final    BOTTLES DRAWN AEROBIC AND ANAEROBIC Blood Culture adequate volume   Culture   Final    NO GROWTH 5 DAYS Performed at [facility], [place] [address] [place], [place] [address]    Report Status 11/09/2021 FINAL  Final     Labs: BNP (last 3 results) Recent Labs    11/05/21 0258  BNP 28.1  Basic Metabolic Panel: Recent Labs  Lab 11/07/21 1550 11/08/21 0157 11/09/21 0221 11/10/21 0321 11/11/21 0354  NA 145 145 144 144 141  K 4.0 3.7 4.2 3.4* 3.4*  CL 113* 114* 111 113* 108  CO2 '23 23 23 23 '$ 21*  GLUCOSE 114* 109* 107* 111* 83  BUN 29* 25* '19 16 12  '$ CREATININE 1.62* 1.55* 1.28* 1.14 1.26*  CALCIUM 8.4* 8.4* 8.6* 8.2* 8.2*  MG 1.9  --   --   --   --    Liver Function Tests: No results for input(s): "AST", "ALT", "ALKPHOS", "BILITOT", "PROT", "ALBUMIN" in the last 168 hours.  No results for input(s): "LIPASE", "AMYLASE" in the last 168 hours. No results for input(s): "AMMONIA" in the last 168 hours. CBC: Recent Labs  Lab 11/05/21 0258 11/06/21 0444 11/10/21 1817 11/11/21 0354  WBC 3.7* 10.2 6.8 6.3  HGB 8.5* 8.0* 7.1* 8.3*  HCT 26.0* 24.7* 22.0* 25.2*  MCV 94.5 93.9 97.3 94.7  PLT 133* 113* 182 239   Cardiac Enzymes: No results for input(s): "CKTOTAL", "CKMB", "CKMBINDEX", "TROPONINI" in the last 168 hours. BNP: Invalid  input(s): "POCBNP" CBG: No results for input(s): "GLUCAP" in the last 168 hours.  D-Dimer No results for input(s): "DDIMER" in the last 72 hours. Hgb A1c No results for input(s): "HGBA1C" in the last 72 hours. Lipid Profile No results for input(s): "CHOL", "HDL", "LDLCALC", "TRIG", "CHOLHDL", "LDLDIRECT" in the last 72 hours. Thyroid function studies No results for input(s): "TSH", "T4TOTAL", "T3FREE", "THYROIDAB" in the last 72 hours.  Invalid input(s): "FREET3" Anemia work up No results for input(s): "VITAMINB12", "FOLATE", "FERRITIN", "TIBC", "IRON", "RETICCTPCT" in the last 72 hours. Urinalysis    Component Value Date/Time   COLORURINE YELLOW 11/04/2021 2050   APPEARANCEUR HAZY (A) 11/04/2021 2050   LABSPEC 1.011 11/04/2021 2050   PHURINE 5.0 11/04/2021 2050   GLUCOSEU NEGATIVE 11/04/2021 2050   HGBUR NEGATIVE 11/04/2021 2050   BILIRUBINUR NEGATIVE 11/04/2021 2050   BILIRUBINUR neg 08/10/2021 [place] 11/04/2021 2050   PROTEINUR 100 (A) 11/04/2021 2050   UROBILINOGEN 0.2 08/10/2021 1616   NITRITE NEGATIVE 11/04/2021 2050   LEUKOCYTESUR NEGATIVE 11/04/2021 2050   Sepsis Labs Recent Labs  Lab 11/05/21 0258 11/06/21 0444 11/10/21 1817 11/11/21 0354  WBC 3.7* 10.2 6.8 6.3   Microbiology Recent Results (from the past 240 hour(s))  Urine Culture     Status: Abnormal   Collection Time: 11/04/21  1:29 PM   Specimen: In/Out Cath Urine  Result Value Ref Range Status   Specimen Description IN/OUT CATH URINE  Final   Special Requests   Final    NONE Performed at [facility], [place] [address], [place], [place] [address]    Culture >=100,000 COLONIES/mL ENTEROCOCCUS FAECALIS (A)  Final   Report Status 11/07/2021 FINAL  Final   Organism ID, Bacteria ENTEROCOCCUS FAECALIS (A)  Final      Susceptibility   Enterococcus faecalis - MIC*    AMPICILLIN <=2 SENSITIVE Sensitive     NITROFURANTOIN <=16 SENSITIVE Sensitive     VANCOMYCIN 1 SENSITIVE  Sensitive     * >=100,000 COLONIES/mL ENTEROCOCCUS FAECALIS  Blood Culture (routine x 2)     Status: None   Collection Time: 11/04/21  1:40 PM   Specimen: BLOOD  Result Value Ref Range Status   Specimen Description BLOOD BLOOD RIGHT WRIST  Final   Special Requests   Final    BOTTLES DRAWN AEROBIC AND ANAEROBIC Blood Culture adequate volume  Culture   Final    NO GROWTH 5 DAYS Performed at [facility], [place] [address] [place], [place] [address]    Report Status 11/09/2021 FINAL  Final  Blood Culture (routine x 2)     Status: None   Collection Time: 11/04/21  1:53 PM   Specimen: BLOOD  Result Value Ref Range Status   Specimen Description BLOOD LEFT ANTECUBITAL  Final   Special Requests   Final    BOTTLES DRAWN AEROBIC AND ANAEROBIC Blood Culture adequate volume   Culture   Final    NO GROWTH 5 DAYS Performed at [facility], [place] [address] [place], [place] [address]    Report Status 11/09/2021 FINAL  Final    Please note: You were cared for by a hospitalist during your hospital stay. Once you are discharged, your primary care physician will handle any further medical issues. Please note that NO REFILLS for any discharge medications will be authorized once you are discharged, as it is imperative that you return to your primary care physician (or establish a relationship with a primary care physician if you do not have one) for your post hospital discharge needs so that they can reassess your need for medications and monitor your lab values.    Time coordinating discharge: 40 minutes  SIGNED:   Shelly Coss, MD  [facility] 11/11/2021, 10:03 PM Pager [phone]  If 7PM-7AM, please contact night-coverage [email or web address] Password [email or web address]

## 2021-11-11 NOTE — Progress Notes (Signed)
[place] place more than 3 times, no one answer call to get report.

## 2021-11-11 NOTE — TOC Transition Note (Signed)
Transition of Care [facility]) - CM/SW Discharge Note   Patient Details  Name: Daniel Bailey. MRN: [id number] Date of Birth: 1950/08/15  Transition of Care [facility]) CM/SW Contact:  Bethann Berkshire, [place] Phone Number: 11/11/2021, 12:06 PM   Clinical Narrative:     Patient will DC to: [name] Anticipated DC date: 11/11/21 Family notified: Guardian [name] Transport by: Ocie Cornfield (scheduled for 7pm)   Per MD patient ready for DC to [facility]. RN, patient, patient's family, and facility notified of DC. Discharge Summary and FL2 sent to facility. RN to call report prior to discharge [phone]). DC packet on chart. Ambulance transport scheduled for 7pm to allow further monitoring of tube feeds.   CSW will sign off for now as social work intervention is no longer needed. Please consult [place] again if new needs arise.   Final next level of care: [facility] Barriers to Discharge: No Barriers Identified   Patient Goals and CMS Choice        Discharge Placement              Patient chooses bed at: [facility] Patient to be transferred to facility by: [place] Name of family member notified: Guardian [name] Patient and family notified of of transfer: 11/11/21  Discharge Plan and Services                                     Social Determinants of Health (SDOH) Interventions     Readmission Risk Interventions     No data to display

## 2021-11-14 ENCOUNTER — Ambulatory Visit: Payer: Medicare Other | Admitting: Gastroenterology

## 2021-11-14 DIAGNOSIS — E1122 Type 2 diabetes mellitus with diabetic chronic kidney disease: Secondary | ICD-10-CM | POA: Diagnosis not present

## 2021-11-14 DIAGNOSIS — J690 Pneumonitis due to inhalation of food and vomit: Secondary | ICD-10-CM | POA: Diagnosis not present

## 2021-11-14 DIAGNOSIS — M6281 Muscle weakness (generalized): Secondary | ICD-10-CM | POA: Diagnosis not present

## 2021-11-14 DIAGNOSIS — N1832 Chronic kidney disease, stage 3b: Secondary | ICD-10-CM | POA: Diagnosis not present

## 2021-11-14 DIAGNOSIS — D508 Other iron deficiency anemias: Secondary | ICD-10-CM | POA: Diagnosis not present

## 2021-11-17 DIAGNOSIS — M6281 Muscle weakness (generalized): Secondary | ICD-10-CM | POA: Diagnosis not present

## 2021-11-17 DIAGNOSIS — J690 Pneumonitis due to inhalation of food and vomit: Secondary | ICD-10-CM | POA: Diagnosis not present

## 2021-11-17 DIAGNOSIS — D508 Other iron deficiency anemias: Secondary | ICD-10-CM | POA: Diagnosis not present

## 2021-11-17 DIAGNOSIS — N1832 Chronic kidney disease, stage 3b: Secondary | ICD-10-CM | POA: Diagnosis not present

## 2021-11-17 DIAGNOSIS — R6521 Severe sepsis with septic shock: Secondary | ICD-10-CM | POA: Diagnosis not present

## 2021-11-18 DIAGNOSIS — N182 Chronic kidney disease, stage 2 (mild): Secondary | ICD-10-CM | POA: Diagnosis not present

## 2021-11-18 DIAGNOSIS — R6520 Severe sepsis without septic shock: Secondary | ICD-10-CM | POA: Diagnosis not present

## 2021-11-18 DIAGNOSIS — D696 Thrombocytopenia, unspecified: Secondary | ICD-10-CM | POA: Diagnosis not present

## 2021-11-18 DIAGNOSIS — J690 Pneumonitis due to inhalation of food and vomit: Secondary | ICD-10-CM | POA: Diagnosis not present

## 2021-11-18 DIAGNOSIS — E782 Mixed hyperlipidemia: Secondary | ICD-10-CM | POA: Diagnosis not present

## 2021-11-18 DIAGNOSIS — K219 Gastro-esophageal reflux disease without esophagitis: Secondary | ICD-10-CM | POA: Diagnosis not present

## 2021-11-18 DIAGNOSIS — I9589 Other hypotension: Secondary | ICD-10-CM | POA: Diagnosis not present

## 2021-11-18 DIAGNOSIS — E1122 Type 2 diabetes mellitus with diabetic chronic kidney disease: Secondary | ICD-10-CM | POA: Diagnosis not present

## 2021-11-18 DIAGNOSIS — M6281 Muscle weakness (generalized): Secondary | ICD-10-CM | POA: Diagnosis not present

## 2021-11-18 DIAGNOSIS — D508 Other iron deficiency anemias: Secondary | ICD-10-CM | POA: Diagnosis not present

## 2021-11-21 DIAGNOSIS — K219 Gastro-esophageal reflux disease without esophagitis: Secondary | ICD-10-CM | POA: Diagnosis not present

## 2021-11-21 DIAGNOSIS — J690 Pneumonitis due to inhalation of food and vomit: Secondary | ICD-10-CM | POA: Diagnosis not present

## 2021-11-21 DIAGNOSIS — E1122 Type 2 diabetes mellitus with diabetic chronic kidney disease: Secondary | ICD-10-CM | POA: Diagnosis not present

## 2021-11-21 DIAGNOSIS — N182 Chronic kidney disease, stage 2 (mild): Secondary | ICD-10-CM | POA: Diagnosis not present

## 2021-11-21 DIAGNOSIS — R6520 Severe sepsis without septic shock: Secondary | ICD-10-CM | POA: Diagnosis not present

## 2021-11-21 DIAGNOSIS — R001 Bradycardia, unspecified: Secondary | ICD-10-CM | POA: Diagnosis not present

## 2021-11-21 DIAGNOSIS — D508 Other iron deficiency anemias: Secondary | ICD-10-CM | POA: Diagnosis not present

## 2021-11-21 DIAGNOSIS — M6281 Muscle weakness (generalized): Secondary | ICD-10-CM | POA: Diagnosis not present

## 2021-11-22 DIAGNOSIS — R1312 Dysphagia, oropharyngeal phase: Secondary | ICD-10-CM | POA: Diagnosis not present

## 2021-11-22 DIAGNOSIS — R680 Hypothermia, not associated with low environmental temperature: Secondary | ICD-10-CM | POA: Diagnosis not present

## 2021-11-22 DIAGNOSIS — I4510 Unspecified right bundle-branch block: Secondary | ICD-10-CM | POA: Diagnosis not present

## 2021-11-22 DIAGNOSIS — D649 Anemia, unspecified: Secondary | ICD-10-CM | POA: Diagnosis not present

## 2021-11-22 DIAGNOSIS — G4733 Obstructive sleep apnea (adult) (pediatric): Secondary | ICD-10-CM | POA: Diagnosis not present

## 2021-11-22 DIAGNOSIS — N1831 Chronic kidney disease, stage 3a: Secondary | ICD-10-CM | POA: Diagnosis not present

## 2021-11-22 DIAGNOSIS — I517 Cardiomegaly: Secondary | ICD-10-CM | POA: Diagnosis not present

## 2021-11-22 DIAGNOSIS — J690 Pneumonitis due to inhalation of food and vomit: Secondary | ICD-10-CM | POA: Diagnosis not present

## 2021-11-23 DIAGNOSIS — E1122 Type 2 diabetes mellitus with diabetic chronic kidney disease: Secondary | ICD-10-CM | POA: Diagnosis not present

## 2021-11-23 DIAGNOSIS — R6520 Severe sepsis without septic shock: Secondary | ICD-10-CM | POA: Diagnosis not present

## 2021-11-23 DIAGNOSIS — M6281 Muscle weakness (generalized): Secondary | ICD-10-CM | POA: Diagnosis not present

## 2021-11-23 DIAGNOSIS — J690 Pneumonitis due to inhalation of food and vomit: Secondary | ICD-10-CM | POA: Diagnosis not present

## 2021-11-23 DIAGNOSIS — D508 Other iron deficiency anemias: Secondary | ICD-10-CM | POA: Diagnosis not present

## 2021-11-23 DIAGNOSIS — N182 Chronic kidney disease, stage 2 (mild): Secondary | ICD-10-CM | POA: Diagnosis not present

## 2021-11-23 DIAGNOSIS — K219 Gastro-esophageal reflux disease without esophagitis: Secondary | ICD-10-CM | POA: Diagnosis not present

## 2021-11-23 DIAGNOSIS — R001 Bradycardia, unspecified: Secondary | ICD-10-CM | POA: Diagnosis not present

## 2021-11-25 DIAGNOSIS — E1122 Type 2 diabetes mellitus with diabetic chronic kidney disease: Secondary | ICD-10-CM | POA: Diagnosis not present

## 2021-11-25 DIAGNOSIS — R6520 Severe sepsis without septic shock: Secondary | ICD-10-CM | POA: Diagnosis not present

## 2021-11-25 DIAGNOSIS — I517 Cardiomegaly: Secondary | ICD-10-CM | POA: Diagnosis not present

## 2021-11-25 DIAGNOSIS — R1312 Dysphagia, oropharyngeal phase: Secondary | ICD-10-CM | POA: Diagnosis not present

## 2021-11-25 DIAGNOSIS — R680 Hypothermia, not associated with low environmental temperature: Secondary | ICD-10-CM | POA: Diagnosis not present

## 2021-11-25 DIAGNOSIS — R634 Abnormal weight loss: Secondary | ICD-10-CM | POA: Diagnosis not present

## 2021-11-25 DIAGNOSIS — M6281 Muscle weakness (generalized): Secondary | ICD-10-CM | POA: Diagnosis not present

## 2021-11-25 DIAGNOSIS — K219 Gastro-esophageal reflux disease without esophagitis: Secondary | ICD-10-CM | POA: Diagnosis not present

## 2021-11-25 DIAGNOSIS — D649 Anemia, unspecified: Secondary | ICD-10-CM | POA: Diagnosis not present

## 2021-11-25 DIAGNOSIS — N1831 Chronic kidney disease, stage 3a: Secondary | ICD-10-CM | POA: Diagnosis not present

## 2021-11-25 DIAGNOSIS — D508 Other iron deficiency anemias: Secondary | ICD-10-CM | POA: Diagnosis not present

## 2021-11-25 DIAGNOSIS — N182 Chronic kidney disease, stage 2 (mild): Secondary | ICD-10-CM | POA: Diagnosis not present

## 2021-11-25 DIAGNOSIS — G4733 Obstructive sleep apnea (adult) (pediatric): Secondary | ICD-10-CM | POA: Diagnosis not present

## 2021-11-25 DIAGNOSIS — J690 Pneumonitis due to inhalation of food and vomit: Secondary | ICD-10-CM | POA: Diagnosis not present

## 2021-11-25 DIAGNOSIS — R001 Bradycardia, unspecified: Secondary | ICD-10-CM | POA: Diagnosis not present

## 2021-11-25 DIAGNOSIS — I4510 Unspecified right bundle-branch block: Secondary | ICD-10-CM | POA: Diagnosis not present

## 2021-11-28 DIAGNOSIS — R001 Bradycardia, unspecified: Secondary | ICD-10-CM | POA: Diagnosis not present

## 2021-11-28 DIAGNOSIS — D508 Other iron deficiency anemias: Secondary | ICD-10-CM | POA: Diagnosis not present

## 2021-11-28 DIAGNOSIS — N182 Chronic kidney disease, stage 2 (mild): Secondary | ICD-10-CM | POA: Diagnosis not present

## 2021-11-28 DIAGNOSIS — M6281 Muscle weakness (generalized): Secondary | ICD-10-CM | POA: Diagnosis not present

## 2021-11-28 DIAGNOSIS — J690 Pneumonitis due to inhalation of food and vomit: Secondary | ICD-10-CM | POA: Diagnosis not present

## 2021-11-28 DIAGNOSIS — R6520 Severe sepsis without septic shock: Secondary | ICD-10-CM | POA: Diagnosis not present

## 2021-11-28 DIAGNOSIS — E1122 Type 2 diabetes mellitus with diabetic chronic kidney disease: Secondary | ICD-10-CM | POA: Diagnosis not present

## 2021-11-29 DIAGNOSIS — I517 Cardiomegaly: Secondary | ICD-10-CM | POA: Diagnosis not present

## 2021-11-29 DIAGNOSIS — R1312 Dysphagia, oropharyngeal phase: Secondary | ICD-10-CM | POA: Diagnosis not present

## 2021-11-29 DIAGNOSIS — N1831 Chronic kidney disease, stage 3a: Secondary | ICD-10-CM | POA: Diagnosis not present

## 2021-11-29 DIAGNOSIS — J690 Pneumonitis due to inhalation of food and vomit: Secondary | ICD-10-CM | POA: Diagnosis not present

## 2021-11-29 DIAGNOSIS — R680 Hypothermia, not associated with low environmental temperature: Secondary | ICD-10-CM | POA: Diagnosis not present

## 2021-11-29 DIAGNOSIS — I4510 Unspecified right bundle-branch block: Secondary | ICD-10-CM | POA: Diagnosis not present

## 2021-11-29 DIAGNOSIS — G4733 Obstructive sleep apnea (adult) (pediatric): Secondary | ICD-10-CM | POA: Diagnosis not present

## 2021-11-29 DIAGNOSIS — D649 Anemia, unspecified: Secondary | ICD-10-CM | POA: Diagnosis not present

## 2021-12-01 DIAGNOSIS — M6281 Muscle weakness (generalized): Secondary | ICD-10-CM | POA: Diagnosis not present

## 2021-12-01 DIAGNOSIS — J690 Pneumonitis due to inhalation of food and vomit: Secondary | ICD-10-CM | POA: Diagnosis not present

## 2021-12-01 DIAGNOSIS — R6520 Severe sepsis without septic shock: Secondary | ICD-10-CM | POA: Diagnosis not present

## 2021-12-01 DIAGNOSIS — R001 Bradycardia, unspecified: Secondary | ICD-10-CM | POA: Diagnosis not present

## 2021-12-01 DIAGNOSIS — D508 Other iron deficiency anemias: Secondary | ICD-10-CM | POA: Diagnosis not present

## 2021-12-01 DIAGNOSIS — E1122 Type 2 diabetes mellitus with diabetic chronic kidney disease: Secondary | ICD-10-CM | POA: Diagnosis not present

## 2021-12-01 DIAGNOSIS — N182 Chronic kidney disease, stage 2 (mild): Secondary | ICD-10-CM | POA: Diagnosis not present

## 2021-12-02 DIAGNOSIS — J690 Pneumonitis due to inhalation of food and vomit: Secondary | ICD-10-CM | POA: Diagnosis not present

## 2021-12-02 DIAGNOSIS — N1831 Chronic kidney disease, stage 3a: Secondary | ICD-10-CM | POA: Diagnosis not present

## 2021-12-02 DIAGNOSIS — I517 Cardiomegaly: Secondary | ICD-10-CM | POA: Diagnosis not present

## 2021-12-02 DIAGNOSIS — R1312 Dysphagia, oropharyngeal phase: Secondary | ICD-10-CM | POA: Diagnosis not present

## 2021-12-02 DIAGNOSIS — G4733 Obstructive sleep apnea (adult) (pediatric): Secondary | ICD-10-CM | POA: Diagnosis not present

## 2021-12-02 DIAGNOSIS — I4510 Unspecified right bundle-branch block: Secondary | ICD-10-CM | POA: Diagnosis not present

## 2021-12-02 DIAGNOSIS — D649 Anemia, unspecified: Secondary | ICD-10-CM | POA: Diagnosis not present

## 2021-12-02 DIAGNOSIS — R680 Hypothermia, not associated with low environmental temperature: Secondary | ICD-10-CM | POA: Diagnosis not present

## 2021-12-06 DIAGNOSIS — I4510 Unspecified right bundle-branch block: Secondary | ICD-10-CM | POA: Diagnosis not present

## 2021-12-06 DIAGNOSIS — N1831 Chronic kidney disease, stage 3a: Secondary | ICD-10-CM | POA: Diagnosis not present

## 2021-12-06 DIAGNOSIS — R680 Hypothermia, not associated with low environmental temperature: Secondary | ICD-10-CM | POA: Diagnosis not present

## 2021-12-06 DIAGNOSIS — J690 Pneumonitis due to inhalation of food and vomit: Secondary | ICD-10-CM | POA: Diagnosis not present

## 2021-12-06 DIAGNOSIS — R1312 Dysphagia, oropharyngeal phase: Secondary | ICD-10-CM | POA: Diagnosis not present

## 2021-12-06 DIAGNOSIS — D649 Anemia, unspecified: Secondary | ICD-10-CM | POA: Diagnosis not present

## 2021-12-06 DIAGNOSIS — I517 Cardiomegaly: Secondary | ICD-10-CM | POA: Diagnosis not present

## 2021-12-06 DIAGNOSIS — G4733 Obstructive sleep apnea (adult) (pediatric): Secondary | ICD-10-CM | POA: Diagnosis not present

## 2021-12-09 DIAGNOSIS — I4510 Unspecified right bundle-branch block: Secondary | ICD-10-CM | POA: Diagnosis not present

## 2021-12-09 DIAGNOSIS — J690 Pneumonitis due to inhalation of food and vomit: Secondary | ICD-10-CM | POA: Diagnosis not present

## 2021-12-09 DIAGNOSIS — D649 Anemia, unspecified: Secondary | ICD-10-CM | POA: Diagnosis not present

## 2021-12-09 DIAGNOSIS — N1831 Chronic kidney disease, stage 3a: Secondary | ICD-10-CM | POA: Diagnosis not present

## 2021-12-09 DIAGNOSIS — R680 Hypothermia, not associated with low environmental temperature: Secondary | ICD-10-CM | POA: Diagnosis not present

## 2021-12-09 DIAGNOSIS — R1312 Dysphagia, oropharyngeal phase: Secondary | ICD-10-CM | POA: Diagnosis not present

## 2021-12-09 DIAGNOSIS — G4733 Obstructive sleep apnea (adult) (pediatric): Secondary | ICD-10-CM | POA: Diagnosis not present

## 2021-12-09 DIAGNOSIS — I517 Cardiomegaly: Secondary | ICD-10-CM | POA: Diagnosis not present

## 2021-12-13 DIAGNOSIS — G4733 Obstructive sleep apnea (adult) (pediatric): Secondary | ICD-10-CM | POA: Diagnosis not present

## 2021-12-13 DIAGNOSIS — J690 Pneumonitis due to inhalation of food and vomit: Secondary | ICD-10-CM | POA: Diagnosis not present

## 2021-12-13 DIAGNOSIS — R680 Hypothermia, not associated with low environmental temperature: Secondary | ICD-10-CM | POA: Diagnosis not present

## 2021-12-13 DIAGNOSIS — I517 Cardiomegaly: Secondary | ICD-10-CM | POA: Diagnosis not present

## 2021-12-13 DIAGNOSIS — N1831 Chronic kidney disease, stage 3a: Secondary | ICD-10-CM | POA: Diagnosis not present

## 2021-12-13 DIAGNOSIS — R1312 Dysphagia, oropharyngeal phase: Secondary | ICD-10-CM | POA: Diagnosis not present

## 2021-12-13 DIAGNOSIS — I4510 Unspecified right bundle-branch block: Secondary | ICD-10-CM | POA: Diagnosis not present

## 2021-12-13 DIAGNOSIS — D649 Anemia, unspecified: Secondary | ICD-10-CM | POA: Diagnosis not present

## 2021-12-16 DIAGNOSIS — R680 Hypothermia, not associated with low environmental temperature: Secondary | ICD-10-CM | POA: Diagnosis not present

## 2021-12-16 DIAGNOSIS — I4510 Unspecified right bundle-branch block: Secondary | ICD-10-CM | POA: Diagnosis not present

## 2021-12-16 DIAGNOSIS — J690 Pneumonitis due to inhalation of food and vomit: Secondary | ICD-10-CM | POA: Diagnosis not present

## 2021-12-16 DIAGNOSIS — R1312 Dysphagia, oropharyngeal phase: Secondary | ICD-10-CM | POA: Diagnosis not present

## 2021-12-16 DIAGNOSIS — N1831 Chronic kidney disease, stage 3a: Secondary | ICD-10-CM | POA: Diagnosis not present

## 2021-12-16 DIAGNOSIS — I517 Cardiomegaly: Secondary | ICD-10-CM | POA: Diagnosis not present

## 2021-12-16 DIAGNOSIS — G4733 Obstructive sleep apnea (adult) (pediatric): Secondary | ICD-10-CM | POA: Diagnosis not present

## 2021-12-16 DIAGNOSIS — D649 Anemia, unspecified: Secondary | ICD-10-CM | POA: Diagnosis not present

## 2021-12-19 DIAGNOSIS — M6281 Muscle weakness (generalized): Secondary | ICD-10-CM | POA: Diagnosis not present

## 2021-12-19 DIAGNOSIS — D508 Other iron deficiency anemias: Secondary | ICD-10-CM | POA: Diagnosis not present

## 2021-12-19 DIAGNOSIS — E1122 Type 2 diabetes mellitus with diabetic chronic kidney disease: Secondary | ICD-10-CM | POA: Diagnosis not present

## 2021-12-19 DIAGNOSIS — R001 Bradycardia, unspecified: Secondary | ICD-10-CM | POA: Diagnosis not present

## 2021-12-19 DIAGNOSIS — F4329 Adjustment disorder with other symptoms: Secondary | ICD-10-CM | POA: Diagnosis not present

## 2021-12-19 DIAGNOSIS — N182 Chronic kidney disease, stage 2 (mild): Secondary | ICD-10-CM | POA: Diagnosis not present

## 2021-12-19 DIAGNOSIS — R6520 Severe sepsis without septic shock: Secondary | ICD-10-CM | POA: Diagnosis not present

## 2021-12-19 DIAGNOSIS — J690 Pneumonitis due to inhalation of food and vomit: Secondary | ICD-10-CM | POA: Diagnosis not present

## 2021-12-20 DIAGNOSIS — R1312 Dysphagia, oropharyngeal phase: Secondary | ICD-10-CM | POA: Diagnosis not present

## 2021-12-20 DIAGNOSIS — I4510 Unspecified right bundle-branch block: Secondary | ICD-10-CM | POA: Diagnosis not present

## 2021-12-20 DIAGNOSIS — J690 Pneumonitis due to inhalation of food and vomit: Secondary | ICD-10-CM | POA: Diagnosis not present

## 2021-12-20 DIAGNOSIS — N1831 Chronic kidney disease, stage 3a: Secondary | ICD-10-CM | POA: Diagnosis not present

## 2021-12-20 DIAGNOSIS — G4733 Obstructive sleep apnea (adult) (pediatric): Secondary | ICD-10-CM | POA: Diagnosis not present

## 2021-12-20 DIAGNOSIS — R680 Hypothermia, not associated with low environmental temperature: Secondary | ICD-10-CM | POA: Diagnosis not present

## 2021-12-20 DIAGNOSIS — D649 Anemia, unspecified: Secondary | ICD-10-CM | POA: Diagnosis not present

## 2021-12-20 DIAGNOSIS — I517 Cardiomegaly: Secondary | ICD-10-CM | POA: Diagnosis not present

## 2021-12-21 DIAGNOSIS — B351 Tinea unguium: Secondary | ICD-10-CM | POA: Diagnosis not present

## 2021-12-21 DIAGNOSIS — L600 Ingrowing nail: Secondary | ICD-10-CM | POA: Diagnosis not present

## 2021-12-21 DIAGNOSIS — E119 Type 2 diabetes mellitus without complications: Secondary | ICD-10-CM | POA: Diagnosis not present

## 2021-12-21 DIAGNOSIS — M79674 Pain in right toe(s): Secondary | ICD-10-CM | POA: Diagnosis not present

## 2021-12-21 DIAGNOSIS — M79675 Pain in left toe(s): Secondary | ICD-10-CM | POA: Diagnosis not present

## 2021-12-23 DIAGNOSIS — R680 Hypothermia, not associated with low environmental temperature: Secondary | ICD-10-CM | POA: Diagnosis not present

## 2021-12-23 DIAGNOSIS — I517 Cardiomegaly: Secondary | ICD-10-CM | POA: Diagnosis not present

## 2021-12-23 DIAGNOSIS — I4510 Unspecified right bundle-branch block: Secondary | ICD-10-CM | POA: Diagnosis not present

## 2021-12-23 DIAGNOSIS — N1831 Chronic kidney disease, stage 3a: Secondary | ICD-10-CM | POA: Diagnosis not present

## 2021-12-23 DIAGNOSIS — D649 Anemia, unspecified: Secondary | ICD-10-CM | POA: Diagnosis not present

## 2021-12-23 DIAGNOSIS — J690 Pneumonitis due to inhalation of food and vomit: Secondary | ICD-10-CM | POA: Diagnosis not present

## 2021-12-23 DIAGNOSIS — G4733 Obstructive sleep apnea (adult) (pediatric): Secondary | ICD-10-CM | POA: Diagnosis not present

## 2021-12-23 DIAGNOSIS — R1312 Dysphagia, oropharyngeal phase: Secondary | ICD-10-CM | POA: Diagnosis not present

## 2021-12-26 DIAGNOSIS — F4329 Adjustment disorder with other symptoms: Secondary | ICD-10-CM | POA: Diagnosis not present

## 2021-12-27 DIAGNOSIS — I4510 Unspecified right bundle-branch block: Secondary | ICD-10-CM | POA: Diagnosis not present

## 2021-12-27 DIAGNOSIS — R680 Hypothermia, not associated with low environmental temperature: Secondary | ICD-10-CM | POA: Diagnosis not present

## 2021-12-27 DIAGNOSIS — M6281 Muscle weakness (generalized): Secondary | ICD-10-CM | POA: Diagnosis not present

## 2021-12-27 DIAGNOSIS — D649 Anemia, unspecified: Secondary | ICD-10-CM | POA: Diagnosis not present

## 2021-12-27 DIAGNOSIS — R1312 Dysphagia, oropharyngeal phase: Secondary | ICD-10-CM | POA: Diagnosis not present

## 2021-12-27 DIAGNOSIS — J690 Pneumonitis due to inhalation of food and vomit: Secondary | ICD-10-CM | POA: Diagnosis not present

## 2021-12-27 DIAGNOSIS — N1831 Chronic kidney disease, stage 3a: Secondary | ICD-10-CM | POA: Diagnosis not present

## 2021-12-27 DIAGNOSIS — G4733 Obstructive sleep apnea (adult) (pediatric): Secondary | ICD-10-CM | POA: Diagnosis not present

## 2021-12-27 DIAGNOSIS — N182 Chronic kidney disease, stage 2 (mild): Secondary | ICD-10-CM | POA: Diagnosis not present

## 2021-12-27 DIAGNOSIS — D508 Other iron deficiency anemias: Secondary | ICD-10-CM | POA: Diagnosis not present

## 2021-12-27 DIAGNOSIS — I517 Cardiomegaly: Secondary | ICD-10-CM | POA: Diagnosis not present

## 2021-12-27 DIAGNOSIS — I9589 Other hypotension: Secondary | ICD-10-CM | POA: Diagnosis not present

## 2021-12-27 DIAGNOSIS — E1122 Type 2 diabetes mellitus with diabetic chronic kidney disease: Secondary | ICD-10-CM | POA: Diagnosis not present

## 2022-01-03 DIAGNOSIS — I4510 Unspecified right bundle-branch block: Secondary | ICD-10-CM | POA: Diagnosis not present

## 2022-01-03 DIAGNOSIS — R680 Hypothermia, not associated with low environmental temperature: Secondary | ICD-10-CM | POA: Diagnosis not present

## 2022-01-03 DIAGNOSIS — I517 Cardiomegaly: Secondary | ICD-10-CM | POA: Diagnosis not present

## 2022-01-03 DIAGNOSIS — D649 Anemia, unspecified: Secondary | ICD-10-CM | POA: Diagnosis not present

## 2022-01-03 DIAGNOSIS — J690 Pneumonitis due to inhalation of food and vomit: Secondary | ICD-10-CM | POA: Diagnosis not present

## 2022-01-03 DIAGNOSIS — G4733 Obstructive sleep apnea (adult) (pediatric): Secondary | ICD-10-CM | POA: Diagnosis not present

## 2022-01-03 DIAGNOSIS — R1312 Dysphagia, oropharyngeal phase: Secondary | ICD-10-CM | POA: Diagnosis not present

## 2022-01-03 DIAGNOSIS — N1831 Chronic kidney disease, stage 3a: Secondary | ICD-10-CM | POA: Diagnosis not present

## 2022-01-06 DIAGNOSIS — I4510 Unspecified right bundle-branch block: Secondary | ICD-10-CM | POA: Diagnosis not present

## 2022-01-06 DIAGNOSIS — R1312 Dysphagia, oropharyngeal phase: Secondary | ICD-10-CM | POA: Diagnosis not present

## 2022-01-06 DIAGNOSIS — J690 Pneumonitis due to inhalation of food and vomit: Secondary | ICD-10-CM | POA: Diagnosis not present

## 2022-01-06 DIAGNOSIS — N1831 Chronic kidney disease, stage 3a: Secondary | ICD-10-CM | POA: Diagnosis not present

## 2022-01-06 DIAGNOSIS — G4733 Obstructive sleep apnea (adult) (pediatric): Secondary | ICD-10-CM | POA: Diagnosis not present

## 2022-01-06 DIAGNOSIS — I517 Cardiomegaly: Secondary | ICD-10-CM | POA: Diagnosis not present

## 2022-01-06 DIAGNOSIS — R680 Hypothermia, not associated with low environmental temperature: Secondary | ICD-10-CM | POA: Diagnosis not present

## 2022-01-06 DIAGNOSIS — D649 Anemia, unspecified: Secondary | ICD-10-CM | POA: Diagnosis not present

## 2022-01-10 DIAGNOSIS — R1312 Dysphagia, oropharyngeal phase: Secondary | ICD-10-CM | POA: Diagnosis not present

## 2022-01-10 DIAGNOSIS — N1831 Chronic kidney disease, stage 3a: Secondary | ICD-10-CM | POA: Diagnosis not present

## 2022-01-10 DIAGNOSIS — R680 Hypothermia, not associated with low environmental temperature: Secondary | ICD-10-CM | POA: Diagnosis not present

## 2022-01-10 DIAGNOSIS — D649 Anemia, unspecified: Secondary | ICD-10-CM | POA: Diagnosis not present

## 2022-01-10 DIAGNOSIS — I4510 Unspecified right bundle-branch block: Secondary | ICD-10-CM | POA: Diagnosis not present

## 2022-01-10 DIAGNOSIS — G4733 Obstructive sleep apnea (adult) (pediatric): Secondary | ICD-10-CM | POA: Diagnosis not present

## 2022-01-10 DIAGNOSIS — I517 Cardiomegaly: Secondary | ICD-10-CM | POA: Diagnosis not present

## 2022-01-10 DIAGNOSIS — J690 Pneumonitis due to inhalation of food and vomit: Secondary | ICD-10-CM | POA: Diagnosis not present

## 2022-01-12 ENCOUNTER — Telehealth: Payer: Self-pay | Admitting: *Deleted

## 2022-01-12 NOTE — Patient Outreach (Signed)
  Care Coordination   Initial Visit Note   01/12/2022 Name: Daniel Bailey. MRN: [id number] DOB: [birth year]  Daniel Bailey. is a 71 y.o. year old male who sees Tower, Wynelle Fanny, MD for primary care. I spoke with  patient's legal guardian by phone today. Contact was made with the patient's legal guardian today to offer care coordination services as a benefit of their health plan. Services were declined.  What matters to the patients health and wellness today?  No concerns expressed.     Goals Addressed               This Visit's Progress     Patient Stated     COMPLETED: Patient Stated (pt-stated)        Advised to contact PCP office to schedule flu vaccination.        SDOH assessments and interventions completed:  No     Care Coordination Interventions Activated:  Yes  Care Coordination Interventions:  Yes, provided   Follow up plan: No further intervention required.   Encounter Outcome:  Pt. Refused   Emelia Loron RN, BSN [place] [phone] [email or web address]

## 2022-01-19 DIAGNOSIS — I9589 Other hypotension: Secondary | ICD-10-CM | POA: Diagnosis not present

## 2022-01-19 DIAGNOSIS — J690 Pneumonitis due to inhalation of food and vomit: Secondary | ICD-10-CM | POA: Diagnosis not present

## 2022-01-19 DIAGNOSIS — M6281 Muscle weakness (generalized): Secondary | ICD-10-CM | POA: Diagnosis not present

## 2022-01-19 DIAGNOSIS — N182 Chronic kidney disease, stage 2 (mild): Secondary | ICD-10-CM | POA: Diagnosis not present

## 2022-01-19 DIAGNOSIS — D508 Other iron deficiency anemias: Secondary | ICD-10-CM | POA: Diagnosis not present

## 2022-01-19 DIAGNOSIS — E1122 Type 2 diabetes mellitus with diabetic chronic kidney disease: Secondary | ICD-10-CM | POA: Diagnosis not present

## 2022-01-20 DIAGNOSIS — E559 Vitamin D deficiency, unspecified: Secondary | ICD-10-CM | POA: Diagnosis not present

## 2022-01-20 DIAGNOSIS — D518 Other vitamin B12 deficiency anemias: Secondary | ICD-10-CM | POA: Diagnosis not present

## 2022-01-20 DIAGNOSIS — E109 Type 1 diabetes mellitus without complications: Secondary | ICD-10-CM | POA: Diagnosis not present

## 2022-01-24 DIAGNOSIS — I739 Peripheral vascular disease, unspecified: Secondary | ICD-10-CM | POA: Diagnosis not present

## 2022-01-24 DIAGNOSIS — L603 Nail dystrophy: Secondary | ICD-10-CM | POA: Diagnosis not present

## 2022-01-31 DIAGNOSIS — N182 Chronic kidney disease, stage 2 (mild): Secondary | ICD-10-CM | POA: Diagnosis not present

## 2022-01-31 DIAGNOSIS — K219 Gastro-esophageal reflux disease without esophagitis: Secondary | ICD-10-CM | POA: Diagnosis not present

## 2022-01-31 DIAGNOSIS — M6281 Muscle weakness (generalized): Secondary | ICD-10-CM | POA: Diagnosis not present

## 2022-01-31 DIAGNOSIS — D508 Other iron deficiency anemias: Secondary | ICD-10-CM | POA: Diagnosis not present

## 2022-01-31 DIAGNOSIS — E1122 Type 2 diabetes mellitus with diabetic chronic kidney disease: Secondary | ICD-10-CM | POA: Diagnosis not present

## 2022-01-31 DIAGNOSIS — I9589 Other hypotension: Secondary | ICD-10-CM | POA: Diagnosis not present

## 2022-02-06 DIAGNOSIS — R2681 Unsteadiness on feet: Secondary | ICD-10-CM | POA: Diagnosis not present

## 2022-02-06 DIAGNOSIS — G9340 Encephalopathy, unspecified: Secondary | ICD-10-CM | POA: Diagnosis not present

## 2022-02-06 DIAGNOSIS — M6281 Muscle weakness (generalized): Secondary | ICD-10-CM | POA: Diagnosis not present

## 2022-02-08 DIAGNOSIS — M6281 Muscle weakness (generalized): Secondary | ICD-10-CM | POA: Diagnosis not present

## 2022-02-08 DIAGNOSIS — R2681 Unsteadiness on feet: Secondary | ICD-10-CM | POA: Diagnosis not present

## 2022-02-08 DIAGNOSIS — G9340 Encephalopathy, unspecified: Secondary | ICD-10-CM | POA: Diagnosis not present

## 2022-02-09 DIAGNOSIS — G9340 Encephalopathy, unspecified: Secondary | ICD-10-CM | POA: Diagnosis not present

## 2022-02-09 DIAGNOSIS — M6281 Muscle weakness (generalized): Secondary | ICD-10-CM | POA: Diagnosis not present

## 2022-02-09 DIAGNOSIS — R2681 Unsteadiness on feet: Secondary | ICD-10-CM | POA: Diagnosis not present

## 2022-02-10 DIAGNOSIS — G9340 Encephalopathy, unspecified: Secondary | ICD-10-CM | POA: Diagnosis not present

## 2022-02-10 DIAGNOSIS — M6281 Muscle weakness (generalized): Secondary | ICD-10-CM | POA: Diagnosis not present

## 2022-02-10 DIAGNOSIS — R2681 Unsteadiness on feet: Secondary | ICD-10-CM | POA: Diagnosis not present

## 2022-02-13 DIAGNOSIS — R2681 Unsteadiness on feet: Secondary | ICD-10-CM | POA: Diagnosis not present

## 2022-02-13 DIAGNOSIS — G9340 Encephalopathy, unspecified: Secondary | ICD-10-CM | POA: Diagnosis not present

## 2022-02-13 DIAGNOSIS — M6281 Muscle weakness (generalized): Secondary | ICD-10-CM | POA: Diagnosis not present

## 2022-02-14 DIAGNOSIS — R2681 Unsteadiness on feet: Secondary | ICD-10-CM | POA: Diagnosis not present

## 2022-02-14 DIAGNOSIS — G9340 Encephalopathy, unspecified: Secondary | ICD-10-CM | POA: Diagnosis not present

## 2022-02-14 DIAGNOSIS — M6281 Muscle weakness (generalized): Secondary | ICD-10-CM | POA: Diagnosis not present

## 2022-02-14 DIAGNOSIS — Z23 Encounter for immunization: Secondary | ICD-10-CM | POA: Diagnosis not present

## 2022-02-15 DIAGNOSIS — G9340 Encephalopathy, unspecified: Secondary | ICD-10-CM | POA: Diagnosis not present

## 2022-02-15 DIAGNOSIS — M6281 Muscle weakness (generalized): Secondary | ICD-10-CM | POA: Diagnosis not present

## 2022-02-15 DIAGNOSIS — R2681 Unsteadiness on feet: Secondary | ICD-10-CM | POA: Diagnosis not present

## 2022-02-16 DIAGNOSIS — M6281 Muscle weakness (generalized): Secondary | ICD-10-CM | POA: Diagnosis not present

## 2022-02-16 DIAGNOSIS — R2681 Unsteadiness on feet: Secondary | ICD-10-CM | POA: Diagnosis not present

## 2022-02-16 DIAGNOSIS — G9340 Encephalopathy, unspecified: Secondary | ICD-10-CM | POA: Diagnosis not present

## 2022-02-17 DIAGNOSIS — E1122 Type 2 diabetes mellitus with diabetic chronic kidney disease: Secondary | ICD-10-CM | POA: Diagnosis not present

## 2022-02-17 DIAGNOSIS — K219 Gastro-esophageal reflux disease without esophagitis: Secondary | ICD-10-CM | POA: Diagnosis not present

## 2022-02-17 DIAGNOSIS — M6281 Muscle weakness (generalized): Secondary | ICD-10-CM | POA: Diagnosis not present

## 2022-02-17 DIAGNOSIS — D508 Other iron deficiency anemias: Secondary | ICD-10-CM | POA: Diagnosis not present

## 2022-02-17 DIAGNOSIS — R2681 Unsteadiness on feet: Secondary | ICD-10-CM | POA: Diagnosis not present

## 2022-02-17 DIAGNOSIS — G9340 Encephalopathy, unspecified: Secondary | ICD-10-CM | POA: Diagnosis not present

## 2022-02-17 DIAGNOSIS — I9589 Other hypotension: Secondary | ICD-10-CM | POA: Diagnosis not present

## 2022-02-17 DIAGNOSIS — N182 Chronic kidney disease, stage 2 (mild): Secondary | ICD-10-CM | POA: Diagnosis not present

## 2022-02-17 DIAGNOSIS — E02 Subclinical iodine-deficiency hypothyroidism: Secondary | ICD-10-CM | POA: Diagnosis not present

## 2022-02-20 DIAGNOSIS — M6281 Muscle weakness (generalized): Secondary | ICD-10-CM | POA: Diagnosis not present

## 2022-02-20 DIAGNOSIS — R2681 Unsteadiness on feet: Secondary | ICD-10-CM | POA: Diagnosis not present

## 2022-02-20 DIAGNOSIS — G9340 Encephalopathy, unspecified: Secondary | ICD-10-CM | POA: Diagnosis not present

## 2022-02-21 DIAGNOSIS — R2681 Unsteadiness on feet: Secondary | ICD-10-CM | POA: Diagnosis not present

## 2022-02-21 DIAGNOSIS — M6281 Muscle weakness (generalized): Secondary | ICD-10-CM | POA: Diagnosis not present

## 2022-02-21 DIAGNOSIS — G9340 Encephalopathy, unspecified: Secondary | ICD-10-CM | POA: Diagnosis not present

## 2022-02-23 DIAGNOSIS — G9340 Encephalopathy, unspecified: Secondary | ICD-10-CM | POA: Diagnosis not present

## 2022-02-23 DIAGNOSIS — M6281 Muscle weakness (generalized): Secondary | ICD-10-CM | POA: Diagnosis not present

## 2022-02-23 DIAGNOSIS — R2681 Unsteadiness on feet: Secondary | ICD-10-CM | POA: Diagnosis not present

## 2022-02-27 DIAGNOSIS — G9340 Encephalopathy, unspecified: Secondary | ICD-10-CM | POA: Diagnosis not present

## 2022-02-27 DIAGNOSIS — M6281 Muscle weakness (generalized): Secondary | ICD-10-CM | POA: Diagnosis not present

## 2022-02-27 DIAGNOSIS — R2681 Unsteadiness on feet: Secondary | ICD-10-CM | POA: Diagnosis not present

## 2022-02-28 DIAGNOSIS — M6281 Muscle weakness (generalized): Secondary | ICD-10-CM | POA: Diagnosis not present

## 2022-02-28 DIAGNOSIS — I9589 Other hypotension: Secondary | ICD-10-CM | POA: Diagnosis not present

## 2022-02-28 DIAGNOSIS — N182 Chronic kidney disease, stage 2 (mild): Secondary | ICD-10-CM | POA: Diagnosis not present

## 2022-02-28 DIAGNOSIS — E1122 Type 2 diabetes mellitus with diabetic chronic kidney disease: Secondary | ICD-10-CM | POA: Diagnosis not present

## 2022-02-28 DIAGNOSIS — K219 Gastro-esophageal reflux disease without esophagitis: Secondary | ICD-10-CM | POA: Diagnosis not present

## 2022-02-28 DIAGNOSIS — D508 Other iron deficiency anemias: Secondary | ICD-10-CM | POA: Diagnosis not present

## 2022-03-01 DIAGNOSIS — R2681 Unsteadiness on feet: Secondary | ICD-10-CM | POA: Diagnosis not present

## 2022-03-01 DIAGNOSIS — G9340 Encephalopathy, unspecified: Secondary | ICD-10-CM | POA: Diagnosis not present

## 2022-03-01 DIAGNOSIS — M6281 Muscle weakness (generalized): Secondary | ICD-10-CM | POA: Diagnosis not present

## 2022-03-02 DIAGNOSIS — Z23 Encounter for immunization: Secondary | ICD-10-CM | POA: Diagnosis not present

## 2022-03-02 DIAGNOSIS — G9340 Encephalopathy, unspecified: Secondary | ICD-10-CM | POA: Diagnosis not present

## 2022-03-02 DIAGNOSIS — M6281 Muscle weakness (generalized): Secondary | ICD-10-CM | POA: Diagnosis not present

## 2022-03-02 DIAGNOSIS — R2681 Unsteadiness on feet: Secondary | ICD-10-CM | POA: Diagnosis not present

## 2022-03-03 DIAGNOSIS — M6281 Muscle weakness (generalized): Secondary | ICD-10-CM | POA: Diagnosis not present

## 2022-03-03 DIAGNOSIS — G9340 Encephalopathy, unspecified: Secondary | ICD-10-CM | POA: Diagnosis not present

## 2022-03-03 DIAGNOSIS — R2681 Unsteadiness on feet: Secondary | ICD-10-CM | POA: Diagnosis not present

## 2022-03-07 DIAGNOSIS — R2681 Unsteadiness on feet: Secondary | ICD-10-CM | POA: Diagnosis not present

## 2022-03-07 DIAGNOSIS — M6281 Muscle weakness (generalized): Secondary | ICD-10-CM | POA: Diagnosis not present

## 2022-03-07 DIAGNOSIS — G9340 Encephalopathy, unspecified: Secondary | ICD-10-CM | POA: Diagnosis not present

## 2022-03-08 DIAGNOSIS — M6281 Muscle weakness (generalized): Secondary | ICD-10-CM | POA: Diagnosis not present

## 2022-03-08 DIAGNOSIS — R2681 Unsteadiness on feet: Secondary | ICD-10-CM | POA: Diagnosis not present

## 2022-03-08 DIAGNOSIS — G9340 Encephalopathy, unspecified: Secondary | ICD-10-CM | POA: Diagnosis not present

## 2022-03-13 DIAGNOSIS — G9340 Encephalopathy, unspecified: Secondary | ICD-10-CM | POA: Diagnosis not present

## 2022-03-13 DIAGNOSIS — M6281 Muscle weakness (generalized): Secondary | ICD-10-CM | POA: Diagnosis not present

## 2022-03-13 DIAGNOSIS — R2681 Unsteadiness on feet: Secondary | ICD-10-CM | POA: Diagnosis not present

## 2022-03-14 DIAGNOSIS — G9340 Encephalopathy, unspecified: Secondary | ICD-10-CM | POA: Diagnosis not present

## 2022-03-14 DIAGNOSIS — R2681 Unsteadiness on feet: Secondary | ICD-10-CM | POA: Diagnosis not present

## 2022-03-14 DIAGNOSIS — M6281 Muscle weakness (generalized): Secondary | ICD-10-CM | POA: Diagnosis not present

## 2022-03-15 DIAGNOSIS — R2681 Unsteadiness on feet: Secondary | ICD-10-CM | POA: Diagnosis not present

## 2022-03-15 DIAGNOSIS — G9340 Encephalopathy, unspecified: Secondary | ICD-10-CM | POA: Diagnosis not present

## 2022-03-15 DIAGNOSIS — M6281 Muscle weakness (generalized): Secondary | ICD-10-CM | POA: Diagnosis not present

## 2022-03-17 DIAGNOSIS — R2681 Unsteadiness on feet: Secondary | ICD-10-CM | POA: Diagnosis not present

## 2022-03-17 DIAGNOSIS — M6281 Muscle weakness (generalized): Secondary | ICD-10-CM | POA: Diagnosis not present

## 2022-03-17 DIAGNOSIS — G9340 Encephalopathy, unspecified: Secondary | ICD-10-CM | POA: Diagnosis not present

## 2022-03-21 ENCOUNTER — Ambulatory Visit (INDEPENDENT_AMBULATORY_CARE_PROVIDER_SITE_OTHER): Payer: Medicare Other

## 2022-03-21 VITALS — Ht 66.5 in | Wt 204.0 lb

## 2022-03-21 DIAGNOSIS — Z0000 Encounter for general adult medical examination without abnormal findings: Secondary | ICD-10-CM | POA: Diagnosis not present

## 2022-03-21 NOTE — Patient Instructions (Signed)
Daniel Bailey , Thank you for taking time to come for your Medicare Wellness Visit. I appreciate your ongoing commitment to your health goals. Please review the following plan we discussed and let me know if I can assist you in the future.   Screening recommendations/referrals: Colonoscopy: FOBT 07/17/21 Recommended yearly ophthalmology/optometry visit for glaucoma screening and checkup Recommended yearly dental visit for hygiene and checkup  Vaccinations: Influenza vaccine: 02/09/21, had recently Pneumococcal vaccine: 08/25/16 Tdap vaccine: 01/08/20 Shingles vaccine: n/d   Covid-19: 07/13/19, 08/13/19, 05/04/20  Advanced directives: yes  Conditions/risks identified: none  Next appointment: Follow up in one year for your annual wellness visit. 03/23/23 @ 11:15 am by phone  Preventive Care 72 Years and Older, Male Preventive care refers to lifestyle choices and visits with your health care provider that can promote health and wellness. What does preventive care include? A yearly physical exam. This is also called an annual well check. Dental exams once or twice a year. Routine eye exams. Ask your health care provider how often you should have your eyes checked. Personal lifestyle choices, including: Daily care of your teeth and gums. Regular physical activity. Eating a healthy diet. Avoiding tobacco and drug use. Limiting alcohol use. Practicing safe sex. Taking low doses of aspirin every day. Taking vitamin and mineral supplements as recommended by your health care provider. What happens during an annual well check? The services and screenings done by your health care provider during your annual well check will depend on your age, overall health, lifestyle risk factors, and family history of disease. Counseling  Your health care provider may ask you questions about your: Alcohol use. Tobacco use. Drug use. Emotional well-being. Home and relationship well-being. Sexual activity. Eating  habits. History of falls. Memory and ability to understand (cognition). Work and work Statistician. Screening  You may have the following tests or measurements: Height, weight, and BMI. Blood pressure. Lipid and cholesterol levels. These may be checked every 5 years, or more frequently if you are over 65 years old. Skin check. Lung cancer screening. You may have this screening every year starting at age 72 if you have a 30-pack-year history of smoking and currently smoke or have quit within the past 15 years. Fecal occult blood test (FOBT) of the stool. You may have this test every year starting at age 72. Flexible sigmoidoscopy or colonoscopy. You may have a sigmoidoscopy every 5 years or a colonoscopy every 10 years starting at age 72. Prostate cancer screening. Recommendations will vary depending on your family history and other risks. Hepatitis C blood test. Hepatitis B blood test. Sexually transmitted disease (STD) testing. Diabetes screening. This is done by checking your blood sugar (glucose) after you have not eaten for a while (fasting). You may have this done every 1-3 years. Abdominal aortic aneurysm (AAA) screening. You may need this if you are a current or former smoker. Osteoporosis. You may be screened starting at age 40 if you are at high risk. Talk with your health care provider about your test results, treatment options, and if necessary, the need for more tests. Vaccines  Your health care provider may recommend certain vaccines, such as: Influenza vaccine. This is recommended every year. Tetanus, diphtheria, and acellular pertussis (Tdap, Td) vaccine. You may need a Td booster every 10 years. Zoster vaccine. You may need this after age 72. Pneumococcal 13-valent conjugate (PCV13) vaccine. One dose is recommended after age 3. Pneumococcal polysaccharide (PPSV23) vaccine. One dose is recommended after age 72. Talk to  your health care provider about which screenings and  vaccines you need and how often you need them. This information is not intended to replace advice given to you by your health care provider. Make sure you discuss any questions you have with your health care provider. Document Released: 05/21/2015 Document Revised: 01/12/2016 Document Reviewed: 02/23/2015 Elsevier Interactive Patient Education  2017 [place] Prevention in the Home Falls can cause injuries. They can happen to people of all ages. There are many things you can do to make your home safe and to help prevent falls. What can I do on the outside of my home? Regularly fix the edges of walkways and driveways and fix any cracks. Remove anything that might make you trip as you walk through a door, such as a raised step or threshold. Trim any bushes or trees on the path to your home. Use bright outdoor lighting. Clear any walking paths of anything that might make someone trip, such as rocks or tools. Regularly check to see if handrails are loose or broken. Make sure that both sides of any steps have handrails. Any raised decks and porches should have guardrails on the edges. Have any leaves, snow, or ice cleared regularly. Use sand or salt on walking paths during winter. Clean up any spills in your garage right away. This includes oil or grease spills. What can I do in the bathroom? Use night lights. Install grab bars by the toilet and in the tub and shower. Do not use towel bars as grab bars. Use non-skid mats or decals in the tub or shower. If you need to sit down in the shower, use a plastic, non-slip stool. Keep the floor dry. Clean up any water that spills on the floor as soon as it happens. Remove soap buildup in the tub or shower regularly. Attach bath mats securely with double-sided non-slip rug tape. Do not have throw rugs and other things on the floor that can make you trip. What can I do in the bedroom? Use night lights. Make sure that you have a light by your  bed that is easy to reach. Do not use any sheets or blankets that are too big for your bed. They should not hang down onto the floor. Have a firm chair that has side arms. You can use this for support while you get dressed. Do not have throw rugs and other things on the floor that can make you trip. What can I do in the kitchen? Clean up any spills right away. Avoid walking on wet floors. Keep items that you use a lot in easy-to-reach places. If you need to reach something above you, use a strong step stool that has a grab bar. Keep electrical cords out of the way. Do not use floor polish or wax that makes floors slippery. If you must use wax, use non-skid floor wax. Do not have throw rugs and other things on the floor that can make you trip. What can I do with my stairs? Do not leave any items on the stairs. Make sure that there are handrails on both sides of the stairs and use them. Fix handrails that are broken or loose. Make sure that handrails are as long as the stairways. Check any carpeting to make sure that it is firmly attached to the stairs. Fix any carpet that is loose or worn. Avoid having throw rugs at the top or bottom of the stairs. If you do have throw rugs, attach  them to the floor with carpet tape. Make sure that you have a light switch at the top of the stairs and the bottom of the stairs. If you do not have them, ask someone to add them for you. What else can I do to help prevent falls? Wear shoes that: Do not have high heels. Have rubber bottoms. Are comfortable and fit you well. Are closed at the toe. Do not wear sandals. If you use a stepladder: Make sure that it is fully opened. Do not climb a closed stepladder. Make sure that both sides of the stepladder are locked into place. Ask someone to hold it for you, if possible. Clearly mark and make sure that you can see: Any grab bars or handrails. First and last steps. Where the edge of each step is. Use tools that  help you move around (mobility aids) if they are needed. These include: Canes. Walkers. Scooters. Crutches. Turn on the lights when you go into a dark area. Replace any light bulbs as soon as they burn out. Set up your furniture so you have a clear path. Avoid moving your furniture around. If any of your floors are uneven, fix them. If there are any pets around you, be aware of where they are. Review your medicines with your doctor. Some medicines can make you feel dizzy. This can increase your chance of falling. Ask your doctor what other things that you can do to help prevent falls. This information is not intended to replace advice given to you by your health care provider. Make sure you discuss any questions you have with your health care provider. Document Released: 02/18/2009 Document Revised: 09/30/2015 Document Reviewed: 05/29/2014 Elsevier Interactive Patient Education  2017 [employer].

## 2022-03-21 NOTE — Progress Notes (Signed)
Virtual Visit via Telephone Note  I connected with  Daniel Bailey. on 03/21/22 at  1:30 PM EST by telephone and verified that I am speaking with the correct person using two identifiers. Spoke with [name]  Location: Patient: home Provider: [place] Persons participating in the virtual visit: [place]   I discussed the limitations, risks, security and privacy concerns of performing an evaluation and management service by telephone and the availability of in person appointments. The patient expressed understanding and agreed to proceed.  Interactive audio and video telecommunications were attempted between this nurse and patient, however failed, due to patient having technical difficulties OR patient did not have access to video capability.  We continued and completed visit with audio only.  Some vital signs may be absent or patient reported.   Daniel David, LPN  Subjective:   Daniel Bailey. is a 71 y.o. male who presents for Medicare Annual/Subsequent preventive examination.  Review of Systems     Cardiac Risk Factors include: advanced age (>51mn, >>58women);male gender;diabetes mellitus;hypertension     Objective:    There were no vitals filed for this visit. There is no height or weight on file to calculate BMI.     03/21/2022    1:44 PM 11/05/2021    1:00 AM 11/04/2021    1:23 PM 09/07/2021    1:17 PM 08/02/2021    2:32 PM 07/13/2021    9:09 AM 07/12/2021    7:25 AM  Advanced Directives  Does Patient Have a Medical Advance Directive? Yes Yes No Unable to assess, patient is non-responsive or altered mental status No  No  [place]      Does patient want to make changes to medical advance directive? No - Patient declined No - Guardian declined       Copy of [place]? Yes - validated most recent copy scanned in chart  (See row information) Yes - validated most recent copy scanned in chart (See row information)       Would patient like information on creating a medical advance directive?  No - Guardian declined No - Patient declined   No - Patient declined     Current Medications (verified) Outpatient Encounter Medications as of 03/21/2022  Medication Sig   ferrous sulfate 300 (60 Fe) MG/5ML syrup Place 5 mLs (300 mg total) into feeding tube daily with breakfast.   midodrine (PROAMATINE) 2.5 MG tablet Take 2.5 mg by mouth 2 (two) times daily.   midodrine (PROAMATINE) 5 MG tablet Place 1 tablet (5 mg total) into feeding tube 3 (three) times daily with meals.   Nutritional Supplements (FEEDING SUPPLEMENT, JEVITY 1.5 CAL/FIBER,) LIQD Place 1,000 mLs into feeding tube continuous.   Nutritional Supplements (FEEDING SUPPLEMENT, PROSOURCE TF,) liquid Place 45 mLs into feeding tube 2 (two) times daily.   pantoprazole sodium (PROTONIX) 40 mg Place 40 mg into feeding tube daily.   Water For Irrigation, Sterile (FREE WATER) SOLN Place 250 mLs into feeding tube every 6 (six) hours.   acetaminophen (TYLENOL) 500 MG tablet Place 2 tablets (1,000 mg total) into feeding tube every 8 (eight) hours as needed for moderate pain. (Patient not taking: Reported on 03/21/2022)   potassium chloride (KLOR-CON) 20 MEQ packet Place 40 mEq into feeding tube daily for 3 days.   No facility-administered encounter medications on file as of 03/21/2022.  Allergies (verified) Patient has no known allergies.   History: Past Medical History:  Diagnosis Date   Anemia    Elevated PSA    Frequent urination    Glaucoma    History of COVID-19    Hyperglycemia    Hyperlipidemia    Hypertension    Sleep apnea    Past Surgical History:  Procedure Laterality Date   COLONOSCOPY     IR GASTROSTOMY TUBE MOD SED  11/10/2021   PROSTATE BIOPSY N/A 11/20/2017   Procedure: BIOPSY TRANSRECTAL ULTRASONIC PROSTATE (TUBP);  Surgeon: Abbie Sons, MD;  Location: [facility] ORS;  Service: Urology;  Laterality: N/A;   Family History  Problem Relation Age of Onset   Colon cancer Mother    Kidney disease Mother    Cancer Father        unknown   Social History   Socioeconomic History   Marital status: Single    Spouse name: Not on file   Number of children: Not on file   Years of education: Not on file   Highest education level: Not on file  Occupational History   Not on file  Tobacco Use   Smoking status: Never   Smokeless tobacco: Never  Vaping Use   Vaping Use: Never used  Substance and Sexual Activity   Alcohol use: No    Alcohol/week: 0.0 standard drinks of alcohol   Drug use: No   Sexual activity: Not Currently  Other Topics Concern   Not on file  Social History Narrative   Not on file   Social Determinants of Health   Financial Resource Strain: Low Risk  (03/21/2022)   Overall Financial Resource Strain (CARDIA)    Difficulty of Paying Living Expenses: Not hard at all  Food Insecurity: No Food Insecurity (03/21/2022)   Hunger Vital Sign    Worried About Running Out of Food in the Last Year: Never true    [place] in the Last Year: Never true  Transportation Needs: No Transportation Needs (03/21/2022)   PRAPARE - Hydrologist (Medical): No    Lack of Transportation (Non-Medical): No  Physical Activity: Insufficiently Active (03/21/2022)   Exercise Vital Sign    Days of Exercise per Week: 2 days    Minutes of Exercise per Session: 20 min  Stress: No Stress Concern Present (03/21/2022)   [place]    Feeling of Stress : Only a little  Social Connections: Socially Isolated (03/21/2022)   Social Connection and Isolation Panel [NHANES]    Frequency of Communication with Friends and Family: Once a week    Frequency of Social Gatherings with Friends and Family: More than three times a week    Attends Religious  Services: Never    Marine scientist or Organizations: No    Attends Music therapist: Never    Marital Status: Never married    Tobacco Counseling Counseling given: Not Answered   Clinical Intake:  Pre-visit preparation completed: Yes  Pain : No/denies pain     Nutritional Risks: None Diabetes: Yes CBG done?: No Did pt. bring in CBG monitor from home?: No  How often do you need to have someone help you when you read instructions, pamphlets, or other written materials from your doctor or pharmacy?: 1 - Never  Diabetic?yes Nutrition Risk Assessment:  Has the patient had any N/V/D within the last 2 months?  No  Does  the patient have any non-healing wounds?  No  Has the patient had any unintentional weight loss or weight gain?  No   Diabetes:  Is the patient diabetic?  Yes  If diabetic, was a CBG obtained today?  No  Did the patient bring in their glucometer from home?  No  How often do you monitor your CBG's? Every day.   Financial Strains and Diabetes Management:  Are you having any financial strains with the device, your supplies or your medication? No .  Does the patient want to be seen by Chronic Care Management for management of their diabetes?  No  Would the patient like to be referred to a Nutritionist or for Diabetic Management?  No   Diabetic Exams:  Diabetic Eye Exam: Completed 02/25/19. Overdue for diabetic eye exam. Pt has been advised about the importance in completing this exam.   Diabetic Foot Exam: Completed 08/11/20. Pt has been advised about the importance in completing this exam.   Interpreter Needed?: No  Information entered by :: Kirke Shaggy, LPN   Activities of Daily Living    03/21/2022    1:45 PM 11/05/2021    1:00 AM  In your present state of health, do you have any difficulty performing the following activities:  Hearing? 0 0  Vision? 0 0  Difficulty concentrating or making decisions? 1 1  Walking or climbing  stairs? 1 1  Dressing or bathing? 1 1  Doing errands, shopping? 1 1  Preparing Food and eating ? Y   Using the Toilet? Y   In the past six months, have you accidently leaked urine? N   Do you have problems with loss of bowel control? N   Managing your Medications? Y   Managing your Finances? Y   Housekeeping or managing your Housekeeping? Y     Patient Care Team: Tower, Wynelle Fanny, MD as PCP - General Early Osmond, MD as PCP - Cardiology (Cardiology) Earlie Server, MD as Consulting Physician (Hematology and Oncology)  Indicate any recent Medical Services you may have received from other than [facility] providers in the past year (date may be approximate).     Assessment:   This is a routine wellness examination for Daniel Bailey.  Hearing/Vision screen Hearing Screening - Comments:: No aids Vision Screening - Comments:: Wears glasses- [facility]  Dietary issues and exercise activities discussed: Current Exercise Habits: Home exercise routine, Type of exercise: stretching (P.T.), Time (Minutes): 20, Frequency (Times/Week): 2, Weekly Exercise (Minutes/Week): 40, Intensity: Mild   Goals Addressed             This Visit's Progress    DIET - EAT MORE FRUITS AND VEGETABLES         Depression Screen    03/21/2022    1:42 PM 07/25/2021   10:01 AM 10/15/2020    3:41 PM 10/15/2019    3:32 PM 09/26/2018   12:40 PM 08/29/2017   10:58 AM 08/25/2016   10:50 AM  PHQ 2/9 Scores  PHQ - 2 Score 0 0 0 0 0 0 0  PHQ- 9 Score 0  0 0 0 0     Fall Risk    03/21/2022    1:45 PM 07/25/2021   10:01 AM 10/15/2020    3:41 PM 10/15/2019    3:32 PM 09/26/2018   12:40 PM  [place] in the past year? 1 0 0 0 0  Number falls in past yr: 0 0 0 0  Injury with Fall? 0 0 0 0   Risk for fall due to : History of fall(s)  Medication side effect Medication side effect   Follow up Falls prevention discussed;Falls evaluation completed Falls evaluation completed Falls evaluation completed;Falls prevention  discussed Falls evaluation completed;Falls prevention discussed     FALL RISK PREVENTION PERTAINING TO THE HOME:  Any stairs in or around the home? No  If so, are there any without handrails? No  Home free of loose throw rugs in walkways, pet beds, electrical cords, etc? Yes  Adequate lighting in your home to reduce risk of falls? Yes   ASSISTIVE DEVICES UTILIZED TO PREVENT FALLS:  Life alert? No  Use of a cane, walker or w/c? Yes  Grab bars in the bathroom? Yes  Shower chair or bench in shower? Yes  Elevated toilet seat or a handicapped toilet? Yes   Cognitive Function:unable to complete 2023       10/15/2020    3:44 PM 10/15/2019    3:36 PM 09/26/2018   12:42 PM 08/29/2017    4:12 PM 08/25/2016   10:51 AM  MMSE - Mini Mental State Exam  Not completed: Unable to complete Unable to complete Unable to complete Unable to complete Unable to complete        Immunizations Immunization History  Administered Date(s) Administered   Fluad Quad(high Dose 65+) 01/07/2019, 01/28/2020, 02/09/2021   Influenza Split 02/19/2012   Influenza,inj,Quad PF,6+ Mos 02/28/2013, 02/18/2014, 02/22/2015, 02/25/2016, 03/02/2017, 03/13/2018   PFIZER(Purple Top)SARS-COV-2 Vaccination 07/13/2019, 08/13/2019, 05/04/2020   Pneumococcal Conjugate-13 08/25/2015   Pneumococcal Polysaccharide-23 08/25/2016   Td 02/03/1997, 12/10/2006, 11/23/2009, 01/28/2020    TDAP status: Up to date  Flu Vaccine status: Due, Education has been provided regarding the importance of this vaccine. Advised may receive this vaccine at local pharmacy or Health Dept. Aware to provide a copy of the vaccination record if obtained from local pharmacy or Health Dept. Verbalized acceptance and understanding.  Pneumococcal vaccine status: Up to date  Covid-19 vaccine status: Completed vaccines  Qualifies for Shingles Vaccine? Yes   Zostavax completed No   Shingrix Completed?: No.    Education has been provided regarding the  importance of this vaccine. Patient has been advised to call insurance company to determine out of pocket expense if they have not yet received this vaccine. Advised may also receive vaccine at local pharmacy or Health Dept. Verbalized acceptance and understanding.  Screening Tests Health Maintenance  Topic Date Due   Diabetic kidney evaluation - Urine ACR  Never done   Zoster Vaccines- Shingrix (1 of 2) Never done   OPHTHALMOLOGY EXAM  02/25/2020   COVID-19 Vaccine (4 - Pfizer series) 06/29/2020   FOOT EXAM  08/11/2021   HEMOGLOBIN A1C  11/14/2021   INFLUENZA VACCINE  12/06/2021   COLONOSCOPY (Pts 45-64yr Insurance coverage will need to be confirmed)  04/10/2022   Diabetic kidney evaluation - GFR measurement  11/12/2022   Medicare Annual Wellness (AWV)  03/22/2023   TETANUS/TDAP  01/27/2030   Pneumonia Vaccine 72 Years old  Completed   Hepatitis C Screening  Completed   HPV VACCINES  Aged Out    Health Maintenance  Health Maintenance Due  Topic Date Due   Diabetic kidney evaluation - Urine ACR  Never done   Zoster Vaccines- Shingrix (1 of 2) Never done   OPHTHALMOLOGY EXAM  02/25/2020   COVID-19 Vaccine (4 - Pfizer series) 06/29/2020   FOOT EXAM  08/11/2021   HEMOGLOBIN A1C  11/14/2021  INFLUENZA VACCINE  12/06/2021    Colorectal cancer screening: Type of screening: FOBT/FIT. Completed 07/17/21. Repeat every 1 years  Lung Cancer Screening: (Low Dose CT Chest recommended if Age 29-80 years, 30 pack-year currently smoking OR have quit w/in 15years.) does not qualify.    Additional Screening:  Hepatitis C Screening: does qualify; Completed 07/05/21  Vision Screening: Recommended annual ophthalmology exams for early detection of glaucoma and other disorders of the eye. Is the patient up to date with their annual eye exam?  Yes  Who is the provider or what is the name of the office in which the patient attends annual eye exams? [facility] If pt is not established with a  provider, would they like to be referred to a provider to establish care? No .   Dental Screening: Recommended annual dental exams for proper oral hygiene  Community Resource Referral / Chronic Care Management: CRR required this visit?  No   CCM required this visit?  No      Plan:     I have personally reviewed and noted the following in the patient's chart:   Medical and social history Use of alcohol, tobacco or illicit drugs  Current medications and supplements including opioid prescriptions. Patient is not currently taking opioid prescriptions. Functional ability and status Nutritional status Physical activity Advanced directives List of other physicians Hospitalizations, surgeries, and ER visits in previous 12 months Vitals Screenings to include cognitive, depression, and falls Referrals and appointments  In addition, I have reviewed and discussed with patient certain preventive protocols, quality metrics, and best practice recommendations. A written personalized care plan for preventive services as well as general preventive health recommendations were provided to patient.     Daniel David, LPN   [id number]   Nurse Notes: none

## 2022-03-23 DIAGNOSIS — G9340 Encephalopathy, unspecified: Secondary | ICD-10-CM | POA: Diagnosis not present

## 2022-03-23 DIAGNOSIS — R2681 Unsteadiness on feet: Secondary | ICD-10-CM | POA: Diagnosis not present

## 2022-03-23 DIAGNOSIS — M6281 Muscle weakness (generalized): Secondary | ICD-10-CM | POA: Diagnosis not present

## 2022-03-24 DIAGNOSIS — G9340 Encephalopathy, unspecified: Secondary | ICD-10-CM | POA: Diagnosis not present

## 2022-03-24 DIAGNOSIS — R2681 Unsteadiness on feet: Secondary | ICD-10-CM | POA: Diagnosis not present

## 2022-03-24 DIAGNOSIS — M6281 Muscle weakness (generalized): Secondary | ICD-10-CM | POA: Diagnosis not present

## 2022-03-25 DIAGNOSIS — M6281 Muscle weakness (generalized): Secondary | ICD-10-CM | POA: Diagnosis not present

## 2022-03-25 DIAGNOSIS — R2681 Unsteadiness on feet: Secondary | ICD-10-CM | POA: Diagnosis not present

## 2022-03-25 DIAGNOSIS — G9340 Encephalopathy, unspecified: Secondary | ICD-10-CM | POA: Diagnosis not present

## 2022-03-26 DIAGNOSIS — G9340 Encephalopathy, unspecified: Secondary | ICD-10-CM | POA: Diagnosis not present

## 2022-03-26 DIAGNOSIS — R2681 Unsteadiness on feet: Secondary | ICD-10-CM | POA: Diagnosis not present

## 2022-03-26 DIAGNOSIS — M6281 Muscle weakness (generalized): Secondary | ICD-10-CM | POA: Diagnosis not present

## 2022-03-27 DIAGNOSIS — M6281 Muscle weakness (generalized): Secondary | ICD-10-CM | POA: Diagnosis not present

## 2022-03-27 DIAGNOSIS — R2681 Unsteadiness on feet: Secondary | ICD-10-CM | POA: Diagnosis not present

## 2022-03-27 DIAGNOSIS — G9340 Encephalopathy, unspecified: Secondary | ICD-10-CM | POA: Diagnosis not present

## 2022-03-28 DIAGNOSIS — R2681 Unsteadiness on feet: Secondary | ICD-10-CM | POA: Diagnosis not present

## 2022-03-28 DIAGNOSIS — M6281 Muscle weakness (generalized): Secondary | ICD-10-CM | POA: Diagnosis not present

## 2022-03-28 DIAGNOSIS — G9340 Encephalopathy, unspecified: Secondary | ICD-10-CM | POA: Diagnosis not present

## 2022-03-29 DIAGNOSIS — M6281 Muscle weakness (generalized): Secondary | ICD-10-CM | POA: Diagnosis not present

## 2022-03-29 DIAGNOSIS — E1122 Type 2 diabetes mellitus with diabetic chronic kidney disease: Secondary | ICD-10-CM | POA: Diagnosis not present

## 2022-03-29 DIAGNOSIS — R001 Bradycardia, unspecified: Secondary | ICD-10-CM | POA: Diagnosis not present

## 2022-03-29 DIAGNOSIS — N182 Chronic kidney disease, stage 2 (mild): Secondary | ICD-10-CM | POA: Diagnosis not present

## 2022-03-29 DIAGNOSIS — G9340 Encephalopathy, unspecified: Secondary | ICD-10-CM | POA: Diagnosis not present

## 2022-03-29 DIAGNOSIS — K219 Gastro-esophageal reflux disease without esophagitis: Secondary | ICD-10-CM | POA: Diagnosis not present

## 2022-03-29 DIAGNOSIS — E02 Subclinical iodine-deficiency hypothyroidism: Secondary | ICD-10-CM | POA: Diagnosis not present

## 2022-03-29 DIAGNOSIS — D508 Other iron deficiency anemias: Secondary | ICD-10-CM | POA: Diagnosis not present

## 2022-03-29 DIAGNOSIS — R2681 Unsteadiness on feet: Secondary | ICD-10-CM | POA: Diagnosis not present

## 2022-03-30 DIAGNOSIS — R2681 Unsteadiness on feet: Secondary | ICD-10-CM | POA: Diagnosis not present

## 2022-03-30 DIAGNOSIS — G9340 Encephalopathy, unspecified: Secondary | ICD-10-CM | POA: Diagnosis not present

## 2022-03-30 DIAGNOSIS — M6281 Muscle weakness (generalized): Secondary | ICD-10-CM | POA: Diagnosis not present

## 2022-03-31 DIAGNOSIS — E109 Type 1 diabetes mellitus without complications: Secondary | ICD-10-CM | POA: Diagnosis not present

## 2022-03-31 DIAGNOSIS — D518 Other vitamin B12 deficiency anemias: Secondary | ICD-10-CM | POA: Diagnosis not present

## 2022-03-31 DIAGNOSIS — E559 Vitamin D deficiency, unspecified: Secondary | ICD-10-CM | POA: Diagnosis not present

## 2022-03-31 DIAGNOSIS — G9340 Encephalopathy, unspecified: Secondary | ICD-10-CM | POA: Diagnosis not present

## 2022-03-31 DIAGNOSIS — R2681 Unsteadiness on feet: Secondary | ICD-10-CM | POA: Diagnosis not present

## 2022-03-31 DIAGNOSIS — M6281 Muscle weakness (generalized): Secondary | ICD-10-CM | POA: Diagnosis not present

## 2022-03-31 DIAGNOSIS — I10 Essential (primary) hypertension: Secondary | ICD-10-CM | POA: Diagnosis not present

## 2022-04-03 DIAGNOSIS — R2681 Unsteadiness on feet: Secondary | ICD-10-CM | POA: Diagnosis not present

## 2022-04-03 DIAGNOSIS — G9340 Encephalopathy, unspecified: Secondary | ICD-10-CM | POA: Diagnosis not present

## 2022-04-03 DIAGNOSIS — M6281 Muscle weakness (generalized): Secondary | ICD-10-CM | POA: Diagnosis not present

## 2022-04-10 DIAGNOSIS — Z0000 Encounter for general adult medical examination without abnormal findings: Secondary | ICD-10-CM | POA: Diagnosis not present

## 2022-04-18 DIAGNOSIS — N182 Chronic kidney disease, stage 2 (mild): Secondary | ICD-10-CM | POA: Diagnosis not present

## 2022-04-18 DIAGNOSIS — I9589 Other hypotension: Secondary | ICD-10-CM | POA: Diagnosis not present

## 2022-04-18 DIAGNOSIS — E1122 Type 2 diabetes mellitus with diabetic chronic kidney disease: Secondary | ICD-10-CM | POA: Diagnosis not present

## 2022-04-18 DIAGNOSIS — D508 Other iron deficiency anemias: Secondary | ICD-10-CM | POA: Diagnosis not present

## 2022-04-18 DIAGNOSIS — M6281 Muscle weakness (generalized): Secondary | ICD-10-CM | POA: Diagnosis not present

## 2022-04-18 DIAGNOSIS — E782 Mixed hyperlipidemia: Secondary | ICD-10-CM | POA: Diagnosis not present

## 2022-04-19 ENCOUNTER — Emergency Department (HOSPITAL_COMMUNITY): Payer: Medicare Other

## 2022-04-19 ENCOUNTER — Observation Stay (HOSPITAL_COMMUNITY): Payer: Medicare Other

## 2022-04-19 ENCOUNTER — Inpatient Hospital Stay (HOSPITAL_COMMUNITY)
Admission: EM | Admit: 2022-04-19 | Discharge: 2022-04-21 | DRG: 369 | Disposition: A | Discharge status: Skilled Nursing Facility | Payer: Medicare Other | Source: Skilled Nursing Facility | Attending: Internal Medicine | Admitting: Internal Medicine

## 2022-04-19 ENCOUNTER — Other Ambulatory Visit: Payer: Self-pay

## 2022-04-19 ENCOUNTER — Encounter (HOSPITAL_COMMUNITY): Payer: Self-pay

## 2022-04-19 DIAGNOSIS — F79 Unspecified intellectual disabilities: Secondary | ICD-10-CM | POA: Diagnosis not present

## 2022-04-19 DIAGNOSIS — I959 Hypotension, unspecified: Secondary | ICD-10-CM | POA: Diagnosis present

## 2022-04-19 DIAGNOSIS — D62 Acute posthemorrhagic anemia: Secondary | ICD-10-CM | POA: Diagnosis present

## 2022-04-19 DIAGNOSIS — R748 Abnormal levels of other serum enzymes: Secondary | ICD-10-CM | POA: Diagnosis not present

## 2022-04-19 DIAGNOSIS — I4510 Unspecified right bundle-branch block: Secondary | ICD-10-CM | POA: Diagnosis present

## 2022-04-19 DIAGNOSIS — H409 Unspecified glaucoma: Secondary | ICD-10-CM | POA: Diagnosis present

## 2022-04-19 DIAGNOSIS — K2101 Gastro-esophageal reflux disease with esophagitis, with bleeding: Principal | ICD-10-CM | POA: Diagnosis present

## 2022-04-19 DIAGNOSIS — Z800 Family history of malignant neoplasm of digestive organs: Secondary | ICD-10-CM | POA: Diagnosis not present

## 2022-04-19 DIAGNOSIS — K2971 Gastritis, unspecified, with bleeding: Secondary | ICD-10-CM | POA: Diagnosis not present

## 2022-04-19 DIAGNOSIS — R1112 Projectile vomiting: Secondary | ICD-10-CM | POA: Diagnosis not present

## 2022-04-19 DIAGNOSIS — I9589 Other hypotension: Secondary | ICD-10-CM | POA: Diagnosis present

## 2022-04-19 DIAGNOSIS — R051 Acute cough: Secondary | ICD-10-CM | POA: Diagnosis not present

## 2022-04-19 DIAGNOSIS — E785 Hyperlipidemia, unspecified: Secondary | ICD-10-CM | POA: Diagnosis present

## 2022-04-19 DIAGNOSIS — E119 Type 2 diabetes mellitus without complications: Secondary | ICD-10-CM | POA: Diagnosis present

## 2022-04-19 DIAGNOSIS — K8020 Calculus of gallbladder without cholecystitis without obstruction: Secondary | ICD-10-CM | POA: Diagnosis not present

## 2022-04-19 DIAGNOSIS — Z66 Do not resuscitate: Secondary | ICD-10-CM | POA: Diagnosis present

## 2022-04-19 DIAGNOSIS — Z6832 Body mass index (BMI) 32.0-32.9, adult: Secondary | ICD-10-CM | POA: Diagnosis not present

## 2022-04-19 DIAGNOSIS — K429 Umbilical hernia without obstruction or gangrene: Secondary | ICD-10-CM | POA: Diagnosis not present

## 2022-04-19 DIAGNOSIS — R410 Disorientation, unspecified: Secondary | ICD-10-CM | POA: Diagnosis not present

## 2022-04-19 DIAGNOSIS — D649 Anemia, unspecified: Secondary | ICD-10-CM | POA: Diagnosis not present

## 2022-04-19 DIAGNOSIS — K922 Gastrointestinal hemorrhage, unspecified: Secondary | ICD-10-CM | POA: Diagnosis not present

## 2022-04-19 DIAGNOSIS — K920 Hematemesis: Principal | ICD-10-CM

## 2022-04-19 DIAGNOSIS — R059 Cough, unspecified: Secondary | ICD-10-CM | POA: Diagnosis not present

## 2022-04-19 DIAGNOSIS — K3189 Other diseases of stomach and duodenum: Secondary | ICD-10-CM | POA: Diagnosis present

## 2022-04-19 DIAGNOSIS — D638 Anemia in other chronic diseases classified elsewhere: Secondary | ICD-10-CM | POA: Diagnosis present

## 2022-04-19 DIAGNOSIS — E669 Obesity, unspecified: Secondary | ICD-10-CM | POA: Diagnosis present

## 2022-04-19 DIAGNOSIS — Z79899 Other long term (current) drug therapy: Secondary | ICD-10-CM

## 2022-04-19 DIAGNOSIS — I455 Other specified heart block: Secondary | ICD-10-CM | POA: Diagnosis present

## 2022-04-19 DIAGNOSIS — Z8616 Personal history of COVID-19: Secondary | ICD-10-CM | POA: Diagnosis not present

## 2022-04-19 DIAGNOSIS — Z931 Gastrostomy status: Secondary | ICD-10-CM

## 2022-04-19 DIAGNOSIS — G4730 Sleep apnea, unspecified: Secondary | ICD-10-CM | POA: Diagnosis present

## 2022-04-19 DIAGNOSIS — D509 Iron deficiency anemia, unspecified: Secondary | ICD-10-CM | POA: Diagnosis present

## 2022-04-19 DIAGNOSIS — K5909 Other constipation: Secondary | ICD-10-CM | POA: Diagnosis not present

## 2022-04-19 DIAGNOSIS — J9811 Atelectasis: Secondary | ICD-10-CM | POA: Diagnosis not present

## 2022-04-19 DIAGNOSIS — K2970 Gastritis, unspecified, without bleeding: Secondary | ICD-10-CM | POA: Diagnosis present

## 2022-04-19 DIAGNOSIS — Z1152 Encounter for screening for COVID-19: Secondary | ICD-10-CM

## 2022-04-19 DIAGNOSIS — Z841 Family history of disorders of kidney and ureter: Secondary | ICD-10-CM | POA: Diagnosis not present

## 2022-04-19 DIAGNOSIS — K2100 Gastro-esophageal reflux disease with esophagitis, without bleeding: Secondary | ICD-10-CM | POA: Diagnosis not present

## 2022-04-19 DIAGNOSIS — R112 Nausea with vomiting, unspecified: Secondary | ICD-10-CM | POA: Diagnosis not present

## 2022-04-19 DIAGNOSIS — Z7401 Bed confinement status: Secondary | ICD-10-CM | POA: Diagnosis not present

## 2022-04-19 DIAGNOSIS — R9431 Abnormal electrocardiogram [ECG] [EKG]: Secondary | ICD-10-CM | POA: Diagnosis not present

## 2022-04-19 DIAGNOSIS — R1310 Dysphagia, unspecified: Secondary | ICD-10-CM | POA: Diagnosis present

## 2022-04-19 DIAGNOSIS — K4090 Unilateral inguinal hernia, without obstruction or gangrene, not specified as recurrent: Secondary | ICD-10-CM | POA: Diagnosis present

## 2022-04-19 DIAGNOSIS — R404 Transient alteration of awareness: Secondary | ICD-10-CM | POA: Diagnosis not present

## 2022-04-19 DIAGNOSIS — R1110 Vomiting, unspecified: Secondary | ICD-10-CM | POA: Diagnosis not present

## 2022-04-19 DIAGNOSIS — R1111 Vomiting without nausea: Secondary | ICD-10-CM | POA: Diagnosis not present

## 2022-04-19 DIAGNOSIS — I10 Essential (primary) hypertension: Secondary | ICD-10-CM | POA: Diagnosis present

## 2022-04-19 DIAGNOSIS — K31A15 Gastric intestinal metaplasia without dysplasia, involving multiple sites: Secondary | ICD-10-CM | POA: Diagnosis not present

## 2022-04-19 DIAGNOSIS — R4182 Altered mental status, unspecified: Secondary | ICD-10-CM | POA: Diagnosis not present

## 2022-04-19 LAB — CBC
HCT: 33.1 % — ABNORMAL LOW (ref 39.0–52.0)
Hemoglobin: 10.7 g/dL — ABNORMAL LOW (ref 13.0–17.0)
MCH: 32.1 pg (ref 26.0–34.0)
MCHC: 32.3 g/dL (ref 30.0–36.0)
MCV: 99.4 fL (ref 80.0–100.0)
Platelets: 196 10*3/uL (ref 150–400)
RBC: 3.33 MIL/uL — ABNORMAL LOW (ref 4.22–5.81)
RDW: 16.6 % — ABNORMAL HIGH (ref 11.5–15.5)
WBC: 4.6 10*3/uL (ref 4.0–10.5)
nRBC: 0 % (ref 0.0–0.2)

## 2022-04-19 LAB — RESP PANEL BY RT-PCR (RSV, FLU A&B, COVID)  RVPGX2
Influenza A by PCR: NEGATIVE
Influenza B by PCR: NEGATIVE
Resp Syncytial Virus by PCR: NEGATIVE
SARS Coronavirus 2 by RT PCR: NEGATIVE

## 2022-04-19 LAB — AMMONIA: Ammonia: 24 umol/L (ref 9–35)

## 2022-04-19 LAB — COMPREHENSIVE METABOLIC PANEL
ALT: 34 U/L (ref 0–44)
AST: 36 U/L (ref 15–41)
Albumin: 3.1 g/dL — ABNORMAL LOW (ref 3.5–5.0)
Alkaline Phosphatase: 201 U/L — ABNORMAL HIGH (ref 38–126)
Anion gap: 7 (ref 5–15)
BUN: 35 mg/dL — ABNORMAL HIGH (ref 8–23)
CO2: 30 mmol/L (ref 22–32)
Calcium: 9.5 mg/dL (ref 8.9–10.3)
Chloride: 102 mmol/L (ref 98–111)
Creatinine, Ser: 1.03 mg/dL (ref 0.61–1.24)
GFR, Estimated: >60 mL/min (ref >60)
Glucose, Bld: 94 mg/dL (ref 70–99)
Potassium: 5 mmol/L (ref 3.5–5.1)
Sodium: 139 mmol/L (ref 135–145)
Total Bilirubin: 0.2 mg/dL — ABNORMAL LOW (ref 0.3–1.2)
Total Protein: 7.1 g/dL (ref 6.5–8.1)

## 2022-04-19 LAB — TYPE AND SCREEN
ABO/RH(D): AB NEG
Antibody Screen: NEGATIVE

## 2022-04-19 LAB — PROTIME-INR
INR: 0.9 (ref 0.8–1.2)
Prothrombin Time: 12 seconds (ref 11.4–15.2)

## 2022-04-19 LAB — TROPONIN I (HIGH SENSITIVITY): Troponin I (High Sensitivity): 9 ng/L (ref <18)

## 2022-04-19 LAB — HEMATOCRIT: HCT: 27.1 % — ABNORMAL LOW (ref 39.0–52.0)

## 2022-04-19 LAB — HEMOGLOBIN: Hemoglobin: 9.1 g/dL — ABNORMAL LOW (ref 13.0–17.0)

## 2022-04-19 LAB — POC OCCULT BLOOD, ED: Fecal Occult Bld: NEGATIVE

## 2022-04-19 MED ORDER — SODIUM CHLORIDE 0.9 % IV SOLN: INTRAVENOUS | Status: DC

## 2022-04-19 MED ORDER — SODIUM CHLORIDE 0.9 % IV BOLUS
1000.0000 mL | Freq: Once | INTRAVENOUS | Status: AC
Start: 2022-04-19 — End: 2022-04-19
  Administered 2022-04-19: 1000 mL via INTRAVENOUS

## 2022-04-19 MED ORDER — ONDANSETRON HCL 4 MG/2ML IJ SOLN
4.0000 mg | Freq: Once | INTRAMUSCULAR | Status: AC
  Administered 2022-04-19: 4 mg via INTRAVENOUS
  Filled 2022-04-19: qty 2

## 2022-04-19 MED ORDER — ACETAMINOPHEN 650 MG RE SUPP: 650.0000 mg | Freq: Four times a day (QID) | RECTAL | Status: DC | PRN

## 2022-04-19 MED ORDER — PANTOPRAZOLE SODIUM 40 MG IV SOLR
40.0000 mg | Freq: Once | INTRAVENOUS | Status: AC
  Administered 2022-04-19: 40 mg via INTRAVENOUS
  Filled 2022-04-19: qty 10

## 2022-04-19 MED ORDER — IOHEXOL 350 MG/ML SOLN
75.0000 mL | Freq: Once | INTRAVENOUS | Status: AC | PRN
  Administered 2022-04-19: 75 mL via INTRAVENOUS

## 2022-04-19 MED ORDER — ONDANSETRON HCL 4 MG/2ML IJ SOLN: 4.0000 mg | Freq: Four times a day (QID) | INTRAMUSCULAR | Status: DC | PRN

## 2022-04-19 MED ORDER — ONDANSETRON HCL 4 MG PO TABS: 4.0000 mg | ORAL_TABLET | Freq: Four times a day (QID) | ORAL | Status: DC | PRN

## 2022-04-19 MED ORDER — SODIUM CHLORIDE 0.9% FLUSH
3.0000 mL | Freq: Two times a day (BID) | INTRAVENOUS | Status: DC
  Administered 2022-04-20 – 2022-04-21 (×2): 3 mL via INTRAVENOUS

## 2022-04-19 MED ORDER — ACETAMINOPHEN 325 MG PO TABS: 650.0000 mg | ORAL_TABLET | Freq: Four times a day (QID) | ORAL | Status: DC | PRN

## 2022-04-19 MED ORDER — SODIUM CHLORIDE 0.9 % IV SOLN: INTRAVENOUS | Status: AC

## 2022-04-19 NOTE — ED Provider Notes (Signed)
[facility] EMERGENCY DEPARTMENT Provider Note   CSN: [id number] Arrival date & time: 04/19/22  1725     History  Chief Complaint  Patient presents with   Hematemesis    Daniel Bailey. is a 71 y.o. male.  HPI   71 year old male with a signal scan for intellectual disability, DM 2, obesity, GERD who presents emergency department with nausea and vomiting and an episode of coffee-ground emesis earlier today at his facility.  The history is provided by EMS and the patient's legal guardian.  The patient initially had NBNB emesis earlier today and then subsequently his emesis developed into the appearance of coffee grounds therefore EMS was called.  The patient is unable to provide additional history given his intellectual disability.  He denies any abdominal pain or chest pain.  Home Medications Prior to Admission medications   Medication Sig Start Date End Date Taking? Authorizing Provider  acetaminophen (TYLENOL) 500 MG tablet Place 2 tablets (1,000 mg total) into feeding tube every 8 (eight) hours as needed for moderate pain. 11/11/21  Yes Adhikari, Willia Craze, MD  AMINO ACIDS-PROTEIN HYDROLYS PO 45 mLs by PEG Tube route 2 (two) times daily.   Yes [provider]  ferrous sulfate 220 (44 Fe) MG/5ML solution Take 220 mg by mouth every evening.   Yes [provider]  midodrine (PROAMATINE) 2.5 MG tablet Take 2.5 mg by mouth 2 (two) times daily. 12/12/21  Yes [provider]  midodrine (PROAMATINE) 5 MG tablet Place 1 tablet (5 mg total) into feeding tube 3 (three) times daily with meals. 11/11/21  Yes Burnadette Pop, MD  Nutritional Supplements (FEEDING SUPPLEMENT, JEVITY 1.5 CAL/FIBER,) LIQD Place 1,000 mLs into feeding tube continuous. 11/11/21  Yes Burnadette Pop, MD  ondansetron (ZOFRAN-ODT) 4 MG disintegrating tablet Take 4 mg by mouth every 8 (eight) hours as needed for nausea or vomiting. 04/19/22  Yes [provider]  ferrous sulfate 300  (60 Fe) MG/5ML syrup Place 5 mLs (300 mg total) into feeding tube daily with breakfast. Patient not taking: Reported on 04/19/2022 11/12/21   Burnadette Pop, MD  Nutritional Supplements (FEEDING SUPPLEMENT, PROSOURCE TF,) liquid Place 45 mLs into feeding tube 2 (two) times daily. Patient not taking: Reported on 04/19/2022 11/11/21   Burnadette Pop, MD  pantoprazole sodium (PROTONIX) 40 mg Place 40 mg into feeding tube daily. Patient not taking: Reported on 04/19/2022 11/11/21   Burnadette Pop, MD  potassium chloride (KLOR-CON) 20 MEQ packet Place 40 mEq into feeding tube daily for 3 days. Patient not taking: Reported on 04/19/2022 11/11/21 11/14/21  Burnadette Pop, MD  Water For Irrigation, Sterile (FREE WATER) SOLN Place 250 mLs into feeding tube every 6 (six) hours. Patient not taking: Reported on 04/19/2022 11/12/21   John Giovanni, MD      Allergies    Patient has no known allergies.    Review of Systems   Review of Systems  Unable to perform ROS: Other    Physical Exam Updated Vital Signs BP 110/63   Pulse (!) 53   Temp 97.8 F (36.6 C) (Oral)   Resp 15   Ht 5\' 6"  (1.676 m)   Wt 92.5 kg   SpO2 99%   BMI 32.91 kg/m  Physical Exam Vitals and nursing note reviewed. Exam conducted with a chaperone present.  Constitutional:      General: He is not in acute distress.    Appearance: He is well-developed.  HENT:     Head: Normocephalic and atraumatic.  Eyes:     Conjunctiva/sclera: Conjunctivae normal.  Cardiovascular:     Rate and Rhythm: Normal rate and regular rhythm.  Pulmonary:     Effort: Pulmonary effort is normal. No respiratory distress.     Breath sounds: Normal breath sounds.  Abdominal:     Palpations: Abdomen is soft.     Tenderness: There is no abdominal tenderness.     Comments: Umbilical hernia present, reducible, G tube in place  Genitourinary:    Comments: No melena or hematochezia, fecal occult negative Musculoskeletal:        General: No swelling.      Cervical back: Neck supple.  Skin:    General: Skin is warm and dry.     Capillary Refill: Capillary refill takes less than 2 seconds.  Neurological:     Mental Status: He is alert.  Psychiatric:        Mood and Affect: Mood normal.     ED Results / Procedures / Treatments   Labs (all labs ordered are listed, but only abnormal results are displayed) Labs Reviewed  COMPREHENSIVE METABOLIC PANEL - Abnormal; Notable for the following components:      Result Value   BUN 35 (*)    Albumin 3.1 (*)    Alkaline Phosphatase 201 (*)    Total Bilirubin 0.2 (*)    All other components within normal limits  CBC - Abnormal; Notable for the following components:   RBC 3.33 (*)    Hemoglobin 10.7 (*)    HCT 33.1 (*)    RDW 16.6 (*)    All other components within normal limits  HEMOGLOBIN - Abnormal; Notable for the following components:   Hemoglobin 9.1 (*)    All other components within normal limits  HEMATOCRIT - Abnormal; Notable for the following components:   HCT 27.1 (*)    All other components within normal limits  RESP PANEL BY RT-PCR (RSV, FLU A&B, COVID)  [id number]  PROTIME-INR  AMMONIA  COMPREHENSIVE METABOLIC PANEL  HEMOGLOBIN  HEMOGLOBIN  HEMATOCRIT  HEMATOCRIT  MAGNESIUM  GAMMA GT  HEMOGLOBIN  HEMOGLOBIN  HEMATOCRIT  HEMATOCRIT  POC OCCULT BLOOD, ED  TYPE AND SCREEN  TROPONIN I (HIGH SENSITIVITY)    EKG EKG Interpretation  Date/Time:  Wednesday April 19 2022 18:28:37 EST Ventricular Rate:  60 PR Interval:  227 QRS Duration: 162 QT Interval:  443 QTC Calculation: 443 R Axis:   85 Text Interpretation: Sinus rhythm Prolonged PR interval Right bundle branch block Probable inferior infarct, old Probable anterolateral infarct, old Confirmed by Ernie Avena ([id number]) on 04/19/2022 8:08:27 PM  Radiology CT ABDOMEN PELVIS W CONTRAST  Result Date: 04/19/2022 CLINICAL DATA:  Coffee ground emesis, diabetes EXAM: CT ABDOMEN AND PELVIS WITH CONTRAST TECHNIQUE:  Multidetector CT imaging of the abdomen and pelvis was performed using the standard protocol following bolus administration of intravenous contrast. RADIATION DOSE REDUCTION: This exam was performed according to the departmental dose-optimization program which includes automated exposure control, adjustment of the mA and/or kV according to patient size and/or use of iterative reconstruction technique. CONTRAST:  75mL OMNIPAQUE IOHEXOL 350 MG/ML SOLN COMPARISON:  07/12/2021, 04/19/2022 FINDINGS: Lower chest: Scattered hypoventilatory changes at the lung bases. No acute pleural or parenchymal lung disease. Hepatobiliary: Calcified gallstones layer dependently in the gallbladder. No evidence of acute cholecystitis. Hepatic cysts are unchanged. No biliary duct dilation. Pancreas: Unremarkable. No pancreatic ductal dilatation or surrounding inflammatory changes. Spleen: Normal in size without focal abnormality. Adrenals/Urinary Tract: Adrenal glands are unremarkable. Kidneys  are normal, without renal calculi, focal lesion, or hydronephrosis. Bladder is unremarkable. Stomach/Bowel: No bowel obstruction or ileus. The cecum is midline, with normal appendix extending into the left lower quadrant. No bowel wall thickening or inflammatory change. Percutaneous gastrostomy tube is seen within the gastric antrum. Small hiatal hernia. Vascular/Lymphatic: Aortic atherosclerosis. No enlarged abdominal or pelvic lymph nodes. Reproductive: Prostate is unremarkable. Other: No free fluid or free intraperitoneal gas. There is a large fat containing umbilical hernia and right inguinal hernia. No bowel herniation. Musculoskeletal: No acute or destructive bony lesions. Reconstructed images demonstrate no additional findings. IMPRESSION: 1. No bowel obstruction or ileus. 2. Cholelithiasis without cholecystitis. 3. Fat containing umbilical and right inguinal hernias. No bowel herniation. 4.  Aortic Atherosclerosis (ICD10-I70.0).  Electronically Signed   By: Sharlet Salina M.D.   On: 04/19/2022 22:44   DG Chest Port 1 View  Result Date: 04/19/2022 CLINICAL DATA:  Vomiting EXAM: PORTABLE CHEST 1 VIEW COMPARISON:  11/04/2021 FINDINGS: Transverse diameter of heart is slightly increased. There are no signs of pulmonary edema or focal pulmonary consolidation. There is no pleural effusion or pneumothorax. IMPRESSION: No active disease. Electronically Signed   By: Ernie Avena M.D.   On: 04/19/2022 18:32    Procedures .Critical Care  Performed by: Ernie Avena, MD Authorized by: Ernie Avena, MD   Critical care provider statement:    Critical care time (minutes):  30   Critical care was time spent personally by me on the following activities:  Development of treatment plan with patient or surrogate, discussions with consultants, evaluation of patient's response to treatment, examination of patient, ordering and review of laboratory studies, ordering and review of radiographic studies, ordering and performing treatments and interventions, pulse oximetry, re-evaluation of patient's condition and review of old charts   Care discussed with: admitting provider       Medications Ordered in ED Medications  sodium chloride flush (NS) 0.9 % injection 3 mL (3 mLs Intravenous Not Given 04/19/22 2107)  acetaminophen (TYLENOL) tablet 650 mg (has no administration in time range)    Or  acetaminophen (TYLENOL) suppository 650 mg (has no administration in time range)  0.9 %  sodium chloride infusion ( Intravenous New Bag/Given 04/19/22 2016)  ondansetron (ZOFRAN) tablet 4 mg (has no administration in time range)    Or  ondansetron (ZOFRAN) injection 4 mg (has no administration in time range)  sodium chloride 0.9 % bolus 1,000 mL (0 mLs Intravenous Stopped 04/19/22 2014)  pantoprazole (PROTONIX) injection 40 mg (40 mg Intravenous Given 04/19/22 1912)  ondansetron (ZOFRAN) injection 4 mg (4 mg Intravenous Given 04/19/22 2006)   iohexol (OMNIPAQUE) 350 MG/ML injection 75 mL (75 mLs Intravenous Contrast Given 04/19/22 2214)    ED Course/ Medical Decision Making/ A&P                           Medical Decision Making Amount and/or Complexity of Data Reviewed Labs: ordered. Radiology: ordered.  Risk Prescription drug management. Decision regarding hospitalization.   71 year old male with a signal scan for intellectual disability, DM 2, obesity, GERD who presents emergency department with nausea and vomiting and an episode of coffee-ground emesis earlier today at his facility.  The history is provided by EMS and the patient's legal guardian.  The patient initially had NBNB emesis earlier today and then subsequently his emesis developed into the appearance of coffee grounds therefore EMS was called.  The patient is unable to provide additional history given  his intellectual disability.  He denies any abdominal pain or chest pain.  On arrival, the patient was vitally stable.  NSR noted on cardiac telemetry.  Physical exam generally unremarkable.  Patient with an umbilical hernia present that is reducible easily, minimal to no tenderness on exam.  Presenting with nausea and vomiting and subsequent development of coffee-ground emesis.  Has G-tube in place.  Large-bore IV access was obtained and the patient was administered IV fluid bolus in addition to IV Protonix.  Concern for upper GI bleed with coffee-ground emesis of unclear etiology.  CBC without a leukocytosis, anemia present to 10.7, CMP largely unremarkable, troponins negative, COVID-19, influenza, RSV PCR testing negative.  The patient remained hemodynamically stable in the emergency department.  Chest x-ray performed which revealed no focal airspace disease, no pneumomediastinum or evidence of Boerhaave's, CT abdomen pelvis was performed which revealed no evidence of bowel obstruction or incarcerated hernia.  [place] [facility] was consulted and will see the  patient in consultation in the morning, no indication for acute intervention overnight.  Hospitalist medicine was consulted for observation in the setting of coffee-ground emesis, Dr. Antionette Char accepting.  The patient's guardian was updated regarding the plan of care.  Final Clinical Impression(s) / ED Diagnoses Final diagnoses:  Coffee ground emesis    Rx / DC Orders ED Discharge Orders     None         Ernie Avena, MD 04/20/22 0121

## 2022-04-19 NOTE — ED Triage Notes (Incomplete)
Coming from [facility]. Staff reported three episodes of hematemesis, unspecified amount. Zofran given by facility staff, unspecified route or amount. On assessment pt Aox2 baseline confusion, denies abdominal pain or nausea, does not remember amount of emesis.

## 2022-04-19 NOTE — ED Notes (Signed)
Patient placed on 2 liters of oxygen via nasal cannula at this time to maintain spo2 above 90% while sleeping. Patient tolerating well, spo2 remains at 99% on 2 liters.

## 2022-04-19 NOTE — ED Notes (Signed)
Orthostatic vitals incomplete due to patient's inability to stand.  Sitting: 106/68 64 Laying: 108/74 72

## 2022-04-19 NOTE — H&P (Signed)
History and Physical    Daniel Bailey. MVH:[id number] DOB: Feb 01, 1951 DOA: 04/19/2022  PCP: [name], MD   Patient coming from: SNF   Chief Complaint: N/V, coffee ground emesis   HPI: Daniel Bailey. is a 71 y.o. male with medical history significant for intellectual disability, diet-controlled diabetes, and dysphagia with recurrent aspiration pneumonia status post PEG tube placement, now presenting to the emergency department for evaluation of coffee-ground emesis.  Patient was noted to have nonbloody emesis earlier today and later was noted to be vomiting coffee ground material.  EMS was called by nursing facility personnel and the patient was treated with Zofran prior to arrival in the ED.  Patient denies chest pain, abdominal pain, diarrhea, or chills.  ED Course: Upon arrival to the ED, patient is found to be afebrile and saturating well on room air with stable blood pressure and normal heart rate.  EKG demonstrates sinus rhythm with first-degree AV nodal block and RBBB.  Chest x-ray is negative for acute cardiopulmonary disease.  Blood work notable for BUN of 35, alkaline phosphatase 201, and hemoglobin 10.7.  [facility] was consulted by the ED physician and the patient was treated with 1 L of normal saline, Zofran, and 40 mg IV Protonix in the ED.  Review of Systems:  ROS limited by patient's clinical condition.  Past Medical History:  Diagnosis Date   Anemia    Elevated PSA    Frequent urination    Glaucoma    History of COVID-19    Hyperglycemia    Hyperlipidemia    Hypertension    Sleep apnea     Past Surgical History:  Procedure Laterality Date   COLONOSCOPY     IR GASTROSTOMY TUBE MOD SED  11/10/2021   PROSTATE BIOPSY N/A 11/20/2017   Procedure: BIOPSY TRANSRECTAL ULTRASONIC PROSTATE (TUBP);  Surgeon: Abbie Sons, MD;  Location: [facility] ORS;  Service: Urology;  Laterality: N/A;    Social History:   reports that he has never smoked. He has never used  smokeless tobacco. He reports that he does not drink alcohol and does not use drugs.  No Known Allergies  Family History  Problem Relation Age of Onset   Colon cancer Mother    Kidney disease Mother    Cancer Father        unknown     Prior to Admission medications   Medication Sig Start Date End Date Taking? Authorizing Provider  acetaminophen (TYLENOL) 500 MG tablet Place 2 tablets (1,000 mg total) into feeding tube every 8 (eight) hours as needed for moderate pain. Patient not taking: Reported on 03/21/2022 11/11/21   Shelly Coss, MD  ferrous sulfate 300 (60 Fe) MG/5ML syrup Place 5 mLs (300 mg total) into feeding tube daily with breakfast. 11/12/21   Shelly Coss, MD  midodrine (PROAMATINE) 2.5 MG tablet Take 2.5 mg by mouth 2 (two) times daily. 12/12/21   [provider]  midodrine (PROAMATINE) 5 MG tablet Place 1 tablet (5 mg total) into feeding tube 3 (three) times daily with meals. 11/11/21   Shelly Coss, MD  Nutritional Supplements (FEEDING SUPPLEMENT, JEVITY 1.5 CAL/FIBER,) LIQD Place 1,000 mLs into feeding tube continuous. 11/11/21   Shelly Coss, MD  Nutritional Supplements (FEEDING SUPPLEMENT, PROSOURCE TF,) liquid Place 45 mLs into feeding tube 2 (two) times daily. 11/11/21   Shelly Coss, MD  pantoprazole sodium (PROTONIX) 40 mg Place 40 mg into feeding tube daily. 11/11/21   Shelly Coss, MD  potassium chloride (KLOR-CON)  20 MEQ packet Place 40 mEq into feeding tube daily for 3 days. 11/11/21 11/14/21  Shelly Coss, MD  Water For Irrigation, Sterile (FREE WATER) SOLN Place 250 mLs into feeding tube every 6 (six) hours. 11/12/21   Shela Leff, MD    Physical Exam: Vitals:   04/19/22 1738 04/19/22 1743 04/19/22 1830 04/19/22 1915  BP:   107/74 111/68  Pulse:   61 (!) 58  Resp:   17 13  Temp:  97.6 F (36.4 C)    TempSrc:  Oral    SpO2:   99% 96%  Weight: 92.5 kg     Height: _0  (1.676 m)       Constitutional: NAD, no pallor or diaphoresis    Eyes: PERTLA, lids and conjunctivae normal ENMT: Mucous membranes are moist. Posterior pharynx clear of any exudate or lesions.   Neck: supple, no masses  Respiratory: no wheezing, no crackles. No accessory muscle use.  Cardiovascular: S1 & S2 heard, regular rate and rhythm. No extremity edema.  Abdomen: No distension, no tenderness, soft. Bowel sounds active.  Musculoskeletal: no clubbing / cyanosis. No joint deformity upper and lower extremities.   Skin: no significant rashes, lesions, ulcers. Warm, dry, well-perfused. Neurologic: CN 2-12 grossly intact. Moving all extremities. Alert, oriented x2.  Psychiatric: Calm. Cooperative.    Labs and Imaging on Admission: I have personally reviewed following labs and imaging studies  CBC: Recent Labs  Lab 04/19/22 1748  WBC 4.6  HGB 10.7*  HCT 33.1*  MCV 99.4  PLT 121   Basic Metabolic Panel: Recent Labs  Lab 04/19/22 1748  NA 139  K 5.0  CL 102  CO2 30  GLUCOSE 94  BUN 35*  CREATININE 1.03  CALCIUM 9.5   GFR: Estimated Creatinine Clearance: 69 mL/min (by C-G formula based on SCr of 1.03 mg/dL). Liver Function Tests: Recent Labs  Lab 04/19/22 1748  AST 36  ALT 34  ALKPHOS 201*  BILITOT 0.2*  PROT 7.1  ALBUMIN 3.1*   No results for input(s): "LIPASE", "AMYLASE" in the last 168 hours. Recent Labs  Lab 04/19/22 1911  AMMONIA 24   Coagulation Profile: Recent Labs  Lab 04/19/22 1748  INR 0.9   Cardiac Enzymes: No results for input(s): "CKTOTAL", "CKMB", "CKMBINDEX", "TROPONINI" in the last 168 hours. BNP (last 3 results) No results for input(s): "PROBNP" in the last 8760 hours. HbA1C: No results for input(s): "HGBA1C" in the last 72 hours. CBG: No results for input(s): "GLUCAP" in the last 168 hours. Lipid Profile: No results for input(s): "CHOL", "HDL", "LDLCALC", "TRIG", "CHOLHDL", "LDLDIRECT" in the last 72 hours. Thyroid Function Tests: No results for input(s): "TSH", "T4TOTAL", "FREET4", "T3FREE",  "THYROIDAB" in the last 72 hours. Anemia Panel: No results for input(s): "VITAMINB12", "FOLATE", "FERRITIN", "TIBC", "IRON", "RETICCTPCT" in the last 72 hours. Urine analysis:    Component Value Date/Time   COLORURINE YELLOW 11/04/2021 2050   APPEARANCEUR HAZY (A) 11/04/2021 2050   LABSPEC 1.011 11/04/2021 2050   PHURINE 5.0 11/04/2021 2050   GLUCOSEU NEGATIVE 11/04/2021 2050   HGBUR NEGATIVE 11/04/2021 2050   [place] NEGATIVE 11/04/2021 2050   BILIRUBINUR neg 08/10/2021 [place] 11/04/2021 2050   PROTEINUR 100 (A) 11/04/2021 2050   UROBILINOGEN 0.2 08/10/2021 1616   NITRITE NEGATIVE 11/04/2021 2050   LEUKOCYTESUR NEGATIVE 11/04/2021 2050   Sepsis Labs: _1 (procalcitonin:4,lacticidven:4) )No results found for this or any previous visit (from the past 240 hour(s)).   Radiological Exams on Admission: DG Chest Port 1  View  Result Date: 04/19/2022 CLINICAL DATA:  Vomiting EXAM: PORTABLE CHEST 1 VIEW COMPARISON:  11/04/2021 FINDINGS: Transverse diameter of heart is slightly increased. There are no signs of pulmonary edema or focal pulmonary consolidation. There is no pleural effusion or pneumothorax. IMPRESSION: No active disease. Electronically Signed   By: Elmer Picker M.D.   On: 04/19/2022 18:32    EKG: Independently reviewed. Sinus rhythm, 1st degree AV block, RBBB.   Assessment/Plan  1. Acute upper GI bleeding; anemia  - Presents with N/V and coffee-ground emesis  - He is hemodynamically stable in ED with initial Hgb 10.7 (up from 8.3 in July 2023)  - Continue npo and IV PPI, type and screen, trend H&H, follow-up GI recommendations   2. Intellectual disability  - Supportive care, delirium precautions    3. Elevated alkaline phosphatase  - Alk phos is 201 on admission with normal transaminases and bilirubin  - Check GGT, repeat CMP in am    DVT prophylaxis: SCDs  Code Status: DNR  Level of Care: Level of care: Telemetry  Medical Family Communication: Cousin/POA at bedside  Disposition Plan:  Patient is from: SNF  Anticipated d/c is to: SNF  Anticipated d/c date is: Possibly as early as 12/14 or 04/21/22  Patient currently: Pending GI consultation, stable H&H   Consults called: [facility]  Admission status: Observation     Vianne Bulls, MD [facility]  04/19/2022, 8:16 PM

## 2022-04-20 ENCOUNTER — Encounter (HOSPITAL_COMMUNITY): Payer: Self-pay | Admitting: Family Medicine

## 2022-04-20 ENCOUNTER — Observation Stay (HOSPITAL_COMMUNITY): Payer: Medicare Other | Admitting: Certified Registered"

## 2022-04-20 ENCOUNTER — Encounter (HOSPITAL_COMMUNITY)
Admission: EM | Disposition: A | Discharge status: Skilled Nursing Facility | Payer: Self-pay | Source: Skilled Nursing Facility | Attending: Internal Medicine

## 2022-04-20 ENCOUNTER — Observation Stay (HOSPITAL_BASED_OUTPATIENT_CLINIC_OR_DEPARTMENT_OTHER): Payer: Medicare Other | Admitting: Certified Registered"

## 2022-04-20 DIAGNOSIS — R1310 Dysphagia, unspecified: Secondary | ICD-10-CM | POA: Diagnosis present

## 2022-04-20 DIAGNOSIS — K2101 Gastro-esophageal reflux disease with esophagitis, with bleeding: Secondary | ICD-10-CM | POA: Diagnosis not present

## 2022-04-20 DIAGNOSIS — Z8616 Personal history of COVID-19: Secondary | ICD-10-CM | POA: Diagnosis not present

## 2022-04-20 DIAGNOSIS — H409 Unspecified glaucoma: Secondary | ICD-10-CM | POA: Diagnosis present

## 2022-04-20 DIAGNOSIS — K920 Hematemesis: Secondary | ICD-10-CM | POA: Diagnosis not present

## 2022-04-20 DIAGNOSIS — K3189 Other diseases of stomach and duodenum: Secondary | ICD-10-CM

## 2022-04-20 DIAGNOSIS — Z1152 Encounter for screening for COVID-19: Secondary | ICD-10-CM | POA: Diagnosis not present

## 2022-04-20 DIAGNOSIS — R410 Disorientation, unspecified: Secondary | ICD-10-CM | POA: Diagnosis not present

## 2022-04-20 DIAGNOSIS — K2100 Gastro-esophageal reflux disease with esophagitis, without bleeding: Secondary | ICD-10-CM | POA: Diagnosis not present

## 2022-04-20 DIAGNOSIS — D509 Iron deficiency anemia, unspecified: Secondary | ICD-10-CM

## 2022-04-20 DIAGNOSIS — Z800 Family history of malignant neoplasm of digestive organs: Secondary | ICD-10-CM | POA: Diagnosis not present

## 2022-04-20 DIAGNOSIS — R404 Transient alteration of awareness: Secondary | ICD-10-CM | POA: Diagnosis not present

## 2022-04-20 DIAGNOSIS — G4730 Sleep apnea, unspecified: Secondary | ICD-10-CM | POA: Diagnosis present

## 2022-04-20 DIAGNOSIS — K2971 Gastritis, unspecified, with bleeding: Secondary | ICD-10-CM | POA: Diagnosis present

## 2022-04-20 DIAGNOSIS — Z841 Family history of disorders of kidney and ureter: Secondary | ICD-10-CM | POA: Diagnosis not present

## 2022-04-20 DIAGNOSIS — Z79899 Other long term (current) drug therapy: Secondary | ICD-10-CM | POA: Diagnosis not present

## 2022-04-20 DIAGNOSIS — Z66 Do not resuscitate: Secondary | ICD-10-CM | POA: Diagnosis present

## 2022-04-20 DIAGNOSIS — Z6832 Body mass index (BMI) 32.0-32.9, adult: Secondary | ICD-10-CM | POA: Diagnosis not present

## 2022-04-20 DIAGNOSIS — I4510 Unspecified right bundle-branch block: Secondary | ICD-10-CM | POA: Diagnosis present

## 2022-04-20 DIAGNOSIS — R4182 Altered mental status, unspecified: Secondary | ICD-10-CM | POA: Diagnosis not present

## 2022-04-20 DIAGNOSIS — D638 Anemia in other chronic diseases classified elsewhere: Secondary | ICD-10-CM | POA: Diagnosis present

## 2022-04-20 DIAGNOSIS — E119 Type 2 diabetes mellitus without complications: Secondary | ICD-10-CM

## 2022-04-20 DIAGNOSIS — K31A15 Gastric intestinal metaplasia without dysplasia, involving multiple sites: Secondary | ICD-10-CM | POA: Diagnosis not present

## 2022-04-20 DIAGNOSIS — K922 Gastrointestinal hemorrhage, unspecified: Secondary | ICD-10-CM | POA: Diagnosis not present

## 2022-04-20 DIAGNOSIS — K2970 Gastritis, unspecified, without bleeding: Secondary | ICD-10-CM | POA: Diagnosis not present

## 2022-04-20 DIAGNOSIS — Z7401 Bed confinement status: Secondary | ICD-10-CM | POA: Diagnosis not present

## 2022-04-20 DIAGNOSIS — E669 Obesity, unspecified: Secondary | ICD-10-CM | POA: Diagnosis present

## 2022-04-20 DIAGNOSIS — R748 Abnormal levels of other serum enzymes: Secondary | ICD-10-CM | POA: Diagnosis present

## 2022-04-20 DIAGNOSIS — D62 Acute posthemorrhagic anemia: Secondary | ICD-10-CM | POA: Diagnosis present

## 2022-04-20 DIAGNOSIS — I10 Essential (primary) hypertension: Secondary | ICD-10-CM | POA: Diagnosis not present

## 2022-04-20 DIAGNOSIS — F79 Unspecified intellectual disabilities: Secondary | ICD-10-CM | POA: Diagnosis present

## 2022-04-20 DIAGNOSIS — E785 Hyperlipidemia, unspecified: Secondary | ICD-10-CM | POA: Diagnosis present

## 2022-04-20 DIAGNOSIS — I959 Hypotension, unspecified: Secondary | ICD-10-CM | POA: Diagnosis present

## 2022-04-20 DIAGNOSIS — I455 Other specified heart block: Secondary | ICD-10-CM | POA: Diagnosis present

## 2022-04-20 DIAGNOSIS — Z931 Gastrostomy status: Secondary | ICD-10-CM | POA: Diagnosis not present

## 2022-04-20 HISTORY — PX: ESOPHAGOGASTRODUODENOSCOPY (EGD) WITH PROPOFOL: SHX5813

## 2022-04-20 HISTORY — PX: BIOPSY: SHX5522

## 2022-04-20 LAB — COMPREHENSIVE METABOLIC PANEL
ALT: 29 U/L (ref 0–44)
AST: 33 U/L (ref 15–41)
Albumin: 2.7 g/dL — ABNORMAL LOW (ref 3.5–5.0)
Alkaline Phosphatase: 173 U/L — ABNORMAL HIGH (ref 38–126)
Anion gap: 5 (ref 5–15)
BUN: 30 mg/dL — ABNORMAL HIGH (ref 8–23)
CO2: 27 mmol/L (ref 22–32)
Calcium: 8.7 mg/dL — ABNORMAL LOW (ref 8.9–10.3)
Chloride: 106 mmol/L (ref 98–111)
Creatinine, Ser: 1.05 mg/dL (ref 0.61–1.24)
GFR, Estimated: >60 mL/min (ref >60)
Glucose, Bld: 83 mg/dL (ref 70–99)
Potassium: 5.1 mmol/L (ref 3.5–5.1)
Sodium: 138 mmol/L (ref 135–145)
Total Bilirubin: 0.3 mg/dL (ref 0.3–1.2)
Total Protein: 6.3 g/dL — ABNORMAL LOW (ref 6.5–8.1)

## 2022-04-20 LAB — HEMATOCRIT
HCT: 27.3 % — ABNORMAL LOW (ref 39.0–52.0)
HCT: 27.7 % — ABNORMAL LOW (ref 39.0–52.0)
HCT: 26.5 % — ABNORMAL LOW (ref 39.0–52.0)

## 2022-04-20 LAB — HEMOGLOBIN
Hemoglobin: 9 g/dL — ABNORMAL LOW (ref 13.0–17.0)
Hemoglobin: 8.6 g/dL — ABNORMAL LOW (ref 13.0–17.0)

## 2022-04-20 LAB — CBG MONITORING, ED: Glucose-Capillary: 82 mg/dL (ref 70–99)

## 2022-04-20 LAB — MAGNESIUM: Magnesium: 2.1 mg/dL (ref 1.7–2.4)

## 2022-04-20 LAB — GAMMA GT: GGT: 49 U/L (ref 7–50)

## 2022-04-20 SURGERY — ESOPHAGOGASTRODUODENOSCOPY (EGD) WITH PROPOFOL
Anesthesia: Monitor Anesthesia Care

## 2022-04-20 MED ORDER — PANTOPRAZOLE SODIUM 40 MG IV SOLR: 40.0000 mg | Freq: Two times a day (BID) | INTRAVENOUS | Status: DC

## 2022-04-20 MED ORDER — PROSOURCE TF20 ENFIT COMPATIBL EN LIQD
60.0000 mL | Freq: Every day | ENTERAL | Status: DC
  Administered 2022-04-20 – 2022-04-21 (×2): 60 mL
  Filled 2022-04-20: qty 60

## 2022-04-20 MED ORDER — FERROUS SULFATE 300 (60 FE) MG/5ML PO SOLN
300.0000 mg | Freq: Every evening | ORAL | Status: DC
  Filled 2022-04-20: qty 5

## 2022-04-20 MED ORDER — MIDODRINE HCL 5 MG PO TABS
5.0000 mg | ORAL_TABLET | Freq: Three times a day (TID) | ORAL | Status: DC
  Administered 2022-04-20 – 2022-04-21 (×3): 5 mg
  Filled 2022-04-20 (×3): qty 1

## 2022-04-20 MED ORDER — PROSOURCE TF PO LIQD
45.0000 mL | Freq: Two times a day (BID) | ORAL | Status: DC
  Filled 2022-04-20: qty 45

## 2022-04-20 MED ORDER — PROPOFOL 10 MG/ML IV BOLUS
INTRAVENOUS | Status: DC | PRN
  Administered 2022-04-20: 70 mg via INTRAVENOUS

## 2022-04-20 MED ORDER — FREE WATER
250.0000 mL | Freq: Four times a day (QID) | Status: DC
  Administered 2022-04-20 – 2022-04-21 (×3): 250 mL

## 2022-04-20 MED ORDER — PANTOPRAZOLE SODIUM 40 MG IV SOLR
40.0000 mg | Freq: Two times a day (BID) | INTRAVENOUS | Status: DC
  Administered 2022-04-20 – 2022-04-21 (×2): 40 mg via INTRAVENOUS
  Filled 2022-04-20 (×2): qty 10

## 2022-04-20 MED ORDER — EPHEDRINE SULFATE-NACL 50-0.9 MG/10ML-% IV SOSY
PREFILLED_SYRINGE | INTRAVENOUS | Status: DC | PRN
  Administered 2022-04-20: 15 mg via INTRAVENOUS
  Administered 2022-04-20: 10 mg via INTRAVENOUS

## 2022-04-20 MED ORDER — LACTATED RINGERS IV SOLN
INTRAVENOUS | Status: AC | PRN
  Administered 2022-04-20: 10 mL/h via INTRAVENOUS

## 2022-04-20 MED ORDER — JEVITY 1.5 CAL/FIBER PO LIQD
1000.0000 mL | ORAL | Status: DC
  Administered 2022-04-20: 1000 mL
  Filled 2022-04-20 (×2): qty 1000

## 2022-04-20 MED ORDER — LIDOCAINE 2% (20 MG/ML) 5 ML SYRINGE
INTRAMUSCULAR | Status: DC | PRN
  Administered 2022-04-20: 100 mg via INTRAVENOUS

## 2022-04-20 MED ORDER — OMEPRAZOLE 2 MG/ML ORAL SUSPENSION
40.0000 mg | Freq: Two times a day (BID) | ORAL | Status: DC
  Filled 2022-04-20 (×2): qty 20

## 2022-04-20 MED ORDER — PROPOFOL 500 MG/50ML IV EMUL
INTRAVENOUS | Status: DC | PRN
  Administered 2022-04-20: 200 ug/kg/min via INTRAVENOUS

## 2022-04-20 SURGICAL SUPPLY — 15 items
BLOCK BITE 60FR ADLT L/F BLUE (MISCELLANEOUS) ×2 IMPLANT
ELECT REM PT RETURN 9FT ADLT (ELECTROSURGICAL) IMPLANT
ELECTRODE REM PT RTRN 9FT ADLT (ELECTROSURGICAL) IMPLANT
FORCEP RJ3 GP 1.8X160 W-NEEDLE (CUTTING FORCEPS) IMPLANT
FORCEPS BIOP RAD 4 LRG CAP 4 (CUTTING FORCEPS) IMPLANT
NDL SCLEROTHERAPY 25GX240 (NEEDLE) IMPLANT
NEEDLE SCLEROTHERAPY 25GX240 (NEEDLE) IMPLANT
PROBE APC STR FIRE (PROBE) IMPLANT
PROBE INJECTION GOLD (MISCELLANEOUS)
PROBE INJECTION GOLD 7FR (MISCELLANEOUS) IMPLANT
SNARE SHORT THROW 13M SML OVAL (MISCELLANEOUS) IMPLANT
SYR 50ML LL SCALE MARK (SYRINGE) IMPLANT
TUBING ENDO SMARTCAP PENTAX (MISCELLANEOUS) ×4 IMPLANT
TUBING IRRIGATION ENDOGATOR (MISCELLANEOUS) ×2 IMPLANT
WATER STERILE IRR 1000ML POUR (IV SOLUTION) IMPLANT

## 2022-04-20 NOTE — Progress Notes (Signed)
Patient placed on tele box 9.central monitoring made aware.

## 2022-04-20 NOTE — Progress Notes (Signed)
Progress Note   Patient: Daniel Bailey. YQI:[id number] DOB: 1950-12-24 DOA: 04/19/2022     0 DOS: the patient was seen and examined on 04/20/2022   Brief hospital course: Mrs. Sabine was admitted to the hospital with the working diagnosis of upper GI bleed.   71 yo male with the past medical history of T2DM, cognitive impairment, and dysphagia, recurrent aspiration pneumonia, sp PEG tube who presented with coffee ground emesis. He lives at [facility] where he was noted to have vomiting that converted in hematemesis. EMS was called, patient received ondansetron and was transported to the ED. On his initial physical examination his blood pressure was 107/74, HR 61 RR 17 02 saturation 96%, lungs with no rales or wheezing, heart with S1 and S2 present and rhythmic, abdomen with no distention or tenderness and no lower extremity edema.   Na 139, K 5.0 Cl 102 bicarbonate 30 glucose 94 bun 35 cr 1,0  Wbc 4.6 hgb 10,7 plt 196  Sars covid 19 negative   Chest radiograph with no infiltrates or effusions.  EKG 60 bpm, normal axis, right bundle branch block, sinus rhythm with q wave lead II, III, AvF, V3 to V6, with no significant ST segment or T wave changes.   CT abdomen and pelvis with no bowel obstruction or ileus.  Cholelithiasis without cholecystitis.  Umbilical and right inguinal hernia.   Patient was placed on proton pump inhibitors and consulted GI  12/14 upper endoscopy with LA grade D reflux esophagitis with no active bleeding. Mild scattered gastritis, erythematous duodenopathy with no bleeding.  Resume   tube feedings.   Assessment and Plan: * Acute upper GI bleed EGD today with acute esophagitis.  No active bleeding.  Follow up hgb is 9.0 and 8,7.   Plan to continue antiacid therapy with pantoprazole, 40 mg per peg bid. Will need to continue for 8 weeks and then transition to daily.  Resume tube feedings, will try bolus feeding, see if this can reduce degree of reflux.  If good toleration  possible discharge back to SNF tomorrow.   Intellectual disability No agitation, continue neuro checks per unit protocol.   Iron deficiency anemia Cell count has been stable. Iron panel from 03/23 consistent with anemia of chronic diease.   Obesity Calculated BMI is 32,9  Consistent with obesity class 1  Hypotension Continue blood pressure support with midodrine 5 mg po tid.         Subjective: Patient with no chest pain or dyspnea, no nausea or vomiting, limited history due to cognitive impairment   Physical Exam: Vitals:   04/20/22 1115 04/20/22 1130 04/20/22 1200 04/20/22 1651  BP: 109/70 117/64 (!) 85/54 (!) 91/52  Pulse: 62 63 (!) 59 (!) 51  Resp: '18 15 14 16  '$ Temp:  (!) 97.5 F (36.4 C) 98.1 F (36.7 C)   TempSrc:   Oral   SpO2: 96% 94% 98% 98%  Weight:      Height:       Neurology awake and alert ENT with mild pallor with no icterus Cardiovascular with S1 and S2 present and rhythmic with no gallops, rubs or murmurs Respiratory with no rales or wheezing Abdomen with no distention  No lower extremity edema  Data Reviewed:    Family Communication: I spoke with patient's legal guardian over the phone at the bedside, we talked in detail about patient's condition, plan of care and prognosis and all questions were addressed.   Disposition: Status is: Inpatient Remains inpatient appropriate because:  post upper GI bleed monitoring, possible transfer to SNF in am   Planned Discharge Destination: Skilled nursing facility      Author: Tawni Millers, MD 04/20/2022 5:03 PM  For on call review [email or web address].

## 2022-04-20 NOTE — Assessment & Plan Note (Addendum)
Follow up iron panel with serum iron 75, transferrin saturation is 24, TIBC 311 and ferritin 299. Consistent with anemia of chronic disease.  Because adequate iron stores will hold on further iron supplementation.  Plan for follow up iron panel in 2 to 3 weeks as outpatient.

## 2022-04-20 NOTE — Progress Notes (Signed)
Patient arrived to [facility] room 15. Bed in lowest position. Male purewick attached. Call light in reach. Will continue to monitor pt. Currently waiting for tele box to come available to put on patient.

## 2022-04-20 NOTE — Anesthesia Preprocedure Evaluation (Addendum)
Anesthesia Evaluation  Patient identified by MRN, date of birth, ID band Patient awake    Airway Mallampati: II       Dental   Pulmonary sleep apnea , pneumonia   breath sounds clear to auscultation       Cardiovascular hypertension, + dysrhythmias  Rhythm:Regular Rate:Normal     Neuro/Psych    GI/Hepatic Neg liver ROS,GERD  ,,  Endo/Other  diabetes    Renal/GU Renal disease     Musculoskeletal   Abdominal   Peds  Hematology   Anesthesia Other Findings   Reproductive/Obstetrics                             Anesthesia Physical Anesthesia Plan  ASA: 3  Anesthesia Plan:    Post-op Pain Management:    Induction: Intravenous  PONV Risk Score and Plan: Propofol infusion  Airway Management Planned: Simple Face Mask and Nasal Cannula  Additional Equipment:   Intra-op Plan:   Post-operative Plan:   Informed Consent: I have reviewed the patients History and Physical, chart, labs and discussed the procedure including the risks, benefits and alternatives for the proposed anesthesia with the patient or authorized representative who has indicated his/her understanding and acceptance.     Dental advisory given  Plan Discussed with: CRNA and Anesthesiologist  Anesthesia Plan Comments:        Anesthesia Quick Evaluation

## 2022-04-20 NOTE — Op Note (Signed)
[facility] Patient Name: Daniel Bailey Procedure Date : 04/20/2022 MRN: [id number] Attending MD: Jerene Bears , MD, [id number] Date of Birth: [birth year] CSN: [id number] Age: 71 Admit Type: Inpatient Procedure:                Upper GI endoscopy Indications:              Coffee-ground emesis Providers:                Lajuan Lines. Hilarie Fredrickson, MD, Dulcy Fanny, Janee Morn, Technician Referring MD:             [facility] Medicines:                Monitored Anesthesia Care Complications:            No immediate complications. Estimated Blood Loss:     Estimated blood loss was minimal. Procedure:                Pre-Anesthesia Assessment:                           - Prior to the procedure, a History and Physical                            was performed, and patient medications and                            allergies were reviewed. The patient's tolerance of                            previous anesthesia was also reviewed. The risks                            and benefits of the procedure and the sedation                            options and risks were discussed with the patient.                            All questions were answered, and informed consent                            was obtained. Prior Anticoagulants: The patient has                            taken no anticoagulant or antiplatelet agents. ASA                            Grade Assessment: III - A patient with severe                            systemic disease. After reviewing the risks and  benefits, the patient was deemed in satisfactory                            condition to undergo the procedure.                           After obtaining informed consent, the endoscope was                            passed under direct vision. Throughout the                            procedure, the patient's blood pressure, pulse, and                             oxygen saturations were monitored continuously. The                            [id number]-[id number] ([id number]) Olympus endoscope was introduced                            through the mouth, and advanced to the second part                            of duodenum. The upper GI endoscopy was                            accomplished without difficulty. The patient                            tolerated the procedure well. Scope In: Scope Out: Findings:      LA Grade D (one or more mucosal breaks involving at least 75% of       esophageal circumference) esophagitis with no bleeding was found 37 to       40 cm from the incisors. Biopsies were taken with a cold forceps for       histology. This is the most likely source of coffee-ground emesis.      There was evidence of an intact gastrostomy with a patent G-tube present       in the gastric body. This was characterized by healthy appearing mucosa.      Scattered mild inflammation characterized by congestion (edema),       scattered erosions and erythema was found in the gastric fundus, in the       gastric body and in the prepyloric region of the stomach. Biopsies were       taken with a cold forceps for histology and Helicobacter pylori testing.      Patchy mildly erythematous mucosa without active bleeding was found in       the duodenal bulb.      The examined second portion of the duodenum was normal. Impression:               - LA Grade D reflux esophagitis with no active  bleeding. Biopsied. Most consistent with reflux and                            the probable source of coffee-ground emesis.                           - Intact gastrostomy with a patent G-tube present                            characterized by healthy appearing mucosa.                           - Mild scattered gastritis. Biopsied to exclude H.                            Pylori.                           - Erythematous duodenopathy without  bleeding.                           - Normal examined second portion of the duodenum. Moderate Sedation:      N/A Recommendation:           - Return patient to hospital ward for ongoing care.                           - Advance diet as tolerated. Best for patient to                            sit upright as much as possible for at least 90 min                            after eating or feeding via g-tube to help minimize                            reflux.                           - Continue present medications. Begin BID PO PPI                            for 8 weeks and then daily thereafter (can use                            liquid omeprazole 40 mg after discharge via g-tube                            if needed). Avoid NSAIDs.                           - Await pathology results.                           - GI will sign-off, but remain available. Call if  questions. Procedure Code(s):        --- Professional ---                           [phone], Esophagogastroduodenoscopy, flexible,                            transoral; with biopsy, single or multiple Diagnosis Code(s):        --- Professional ---                           K21.00, Gastro-esophageal reflux disease with                            esophagitis, without bleeding                           Z93.1, Gastrostomy status                           K29.70, Gastritis, unspecified, without bleeding                           K31.89, Other diseases of stomach and duodenum                           K92.0, Hematemesis CPT copyright 2022 American Medical Association. All rights reserved. The codes documented in this report are preliminary and upon coder review may  be revised to meet current compliance requirements. Jerene Bears, MD 04/20/2022 10:47:10 AM This report has been signed electronically. Number of Addenda: 0

## 2022-04-20 NOTE — Transfer of Care (Signed)
Immediate Anesthesia Transfer of Care Note  Patient: Daniel Bailey.  Procedure(s) Performed: ESOPHAGOGASTRODUODENOSCOPY (EGD) WITH PROPOFOL BIOPSY  Patient Location: PACU  Anesthesia Type:MAC  Level of Consciousness: drowsy  Airway & Oxygen Therapy: Patient Spontanous Breathing and Patient connected to nasal cannula oxygen  Post-op Assessment: Report given to RN and Post -op Vital signs reviewed and stable  Post vital signs: Reviewed and stable  Last Vitals:  Vitals Value Taken Time  BP 93/62 04/20/22 1041  Temp    Pulse 57 04/20/22 1043  Resp 16 04/20/22 1043  SpO2 96 % 04/20/22 1043  Vitals shown include unvalidated device data.  Last Pain:  Vitals:   04/20/22 1010  TempSrc: Temporal  PainSc: 0-No pain         Complications: No notable events documented.

## 2022-04-20 NOTE — ED Notes (Signed)
Pt transported to endo.  

## 2022-04-20 NOTE — Consult Note (Signed)
Consultation  Referring Provider: [facility]/ Arrien Primary Care Physician:  Tower, Wynelle Fanny, MD Primary Gastroenterologist:  none/unassigned  Reason for Consultation:   coffee ground emesis  HPI: Daniel Bailey. is a 71 y.o. male, resident at [facility], with intellectual disability, history of recurrent aspiration pneumonia status post gastrostomy tube placement July 2023 History of sleep apnea, hypertension, hyperlipidemia. Patient was brought from his facility last night after he had 2-3 episodes of coffee-ground emesis.  There is no report of any melena or hematochezia, no fever or chills.  Patient denies any nausea, denies dysphagia or odynophagia no heartburn no abdominal pain. He is not on any chronic blood thinners or NSAIDs Has not had prior endoscopy according to his guardian/cousin [name].  No prior issues with GI bleeding Did have remote colonoscopy through Dr. Sharlett Iles in our practice 2013 with finding of a few very small rectal polyps.  Biopsies revealed these were hyperplastic otherwise normal exam.  Been hemodynamically stable in the ER but was noted to be somewhat hypothermic earlier this a.m. with rectal temp of 93, he also desaturated a bit while sleeping and is on O2 at 2 L with sat of 99.  Labs pro time 12/INR 0.9 Hemoglobin 10.7 on arrival down to 9.0 this morning/hematocrit 27.3 Albumin 2.7 BUN 30/creatinine 1.05  By review of labs it appears that he has had a chronic anemia-hemoglobin 09/2021 in the 9 range July 2023 hemoglobin 8.5  Chest x-ray today CT of the abdomen and pelvis with contrast last p.m. shows the gastrostomy tube to be present in the antrum there is an umbilical hernia and right inguinal hernia nonobstructive bowel gas pattern and otherwise negative exam      Past Medical History:  Diagnosis Date   Anemia    Elevated PSA    Frequent urination    Glaucoma    History of COVID-19    Hyperglycemia    Hyperlipidemia    Hypertension     Sleep apnea     Past Surgical History:  Procedure Laterality Date   COLONOSCOPY     IR GASTROSTOMY TUBE MOD SED  11/10/2021   PROSTATE BIOPSY N/A 11/20/2017   Procedure: BIOPSY TRANSRECTAL ULTRASONIC PROSTATE (TUBP);  Surgeon: Abbie Sons, MD;  Location: [facility] ORS;  Service: Urology;  Laterality: N/A;    Prior to Admission medications   Medication Sig Start Date End Date Taking? Authorizing Provider  acetaminophen (TYLENOL) 500 MG tablet Place 2 tablets (1,000 mg total) into feeding tube every 8 (eight) hours as needed for moderate pain. 11/11/21  Yes Adhikari, Tamsen Meek, MD  AMINO ACIDS-PROTEIN HYDROLYS PO 45 mLs by PEG Tube route 2 (two) times daily.   Yes [provider]  ferrous sulfate 220 (44 Fe) MG/5ML solution Take 220 mg by mouth every evening.   Yes [provider]  midodrine (PROAMATINE) 2.5 MG tablet Take 2.5 mg by mouth 2 (two) times daily. 12/12/21  Yes [provider]  midodrine (PROAMATINE) 5 MG tablet Place 1 tablet (5 mg total) into feeding tube 3 (three) times daily with meals. 11/11/21  Yes Shelly Coss, MD  Nutritional Supplements (FEEDING SUPPLEMENT, JEVITY 1.5 CAL/FIBER,) LIQD Place 1,000 mLs into feeding tube continuous. 11/11/21  Yes Shelly Coss, MD  ondansetron (ZOFRAN-ODT) 4 MG disintegrating tablet Take 4 mg by mouth every 8 (eight) hours as needed for nausea or vomiting. 04/19/22  Yes [provider]  ferrous sulfate 300 (60 Fe) MG/5ML syrup Place 5 mLs (300 mg total) into  feeding tube daily with breakfast. Patient not taking: Reported on 04/19/2022 11/12/21   Shelly Coss, MD  Nutritional Supplements (FEEDING SUPPLEMENT, PROSOURCE TF,) liquid Place 45 mLs into feeding tube 2 (two) times daily. Patient not taking: Reported on 04/19/2022 11/11/21   Shelly Coss, MD  pantoprazole sodium (PROTONIX) 40 mg Place 40 mg into feeding tube daily. Patient not taking: Reported on 04/19/2022 11/11/21   Shelly Coss, MD  potassium chloride  (KLOR-CON) 20 MEQ packet Place 40 mEq into feeding tube daily for 3 days. Patient not taking: Reported on 04/19/2022 11/11/21 11/14/21  Shelly Coss, MD  Water For Irrigation, Sterile (FREE WATER) SOLN Place 250 mLs into feeding tube every 6 (six) hours. Patient not taking: Reported on 04/19/2022 11/12/21   Shela Leff, MD    Current Facility-Administered Medications  Medication Dose Route Frequency Provider Last Rate Last Admin   0.9 %  sodium chloride infusion   Intravenous Continuous Opyd, Ilene Qua, MD 100 mL/hr at 04/20/22 0701 New Bag at 04/20/22 0701   acetaminophen (TYLENOL) tablet 650 mg  650 mg Oral Q6H PRN Opyd, Ilene Qua, MD       Or   acetaminophen (TYLENOL) suppository 650 mg  650 mg Rectal Q6H PRN Opyd, Ilene Qua, MD       ondansetron (ZOFRAN) tablet 4 mg  4 mg Oral Q6H PRN Opyd, Ilene Qua, MD       Or   ondansetron (ZOFRAN) injection 4 mg  4 mg Intravenous Q6H PRN Opyd, Ilene Qua, MD       sodium chloride flush (NS) 0.9 % injection 3 mL  3 mL Intravenous Q12H Opyd, Ilene Qua, MD       Current Outpatient Medications  Medication Sig Dispense Refill   acetaminophen (TYLENOL) 500 MG tablet Place 2 tablets (1,000 mg total) into feeding tube every 8 (eight) hours as needed for moderate pain. 30 tablet 0   AMINO ACIDS-PROTEIN HYDROLYS PO 45 mLs by PEG Tube route 2 (two) times daily.     ferrous sulfate 220 (44 Fe) MG/5ML solution Take 220 mg by mouth every evening.     midodrine (PROAMATINE) 2.5 MG tablet Take 2.5 mg by mouth 2 (two) times daily.     midodrine (PROAMATINE) 5 MG tablet Place 1 tablet (5 mg total) into feeding tube 3 (three) times daily with meals. 90 tablet 2   Nutritional Supplements (FEEDING SUPPLEMENT, JEVITY 1.5 CAL/FIBER,) LIQD Place 1,000 mLs into feeding tube continuous.     ondansetron (ZOFRAN-ODT) 4 MG disintegrating tablet Take 4 mg by mouth every 8 (eight) hours as needed for nausea or vomiting.     ferrous sulfate 300 (60 Fe) MG/5ML syrup Place 5  mLs (300 mg total) into feeding tube daily with breakfast. (Patient not taking: Reported on 04/19/2022) 150 mL 3   Nutritional Supplements (FEEDING SUPPLEMENT, PROSOURCE TF,) liquid Place 45 mLs into feeding tube 2 (two) times daily. (Patient not taking: Reported on 04/19/2022)     pantoprazole sodium (PROTONIX) 40 mg Place 40 mg into feeding tube daily. (Patient not taking: Reported on 04/19/2022)     potassium chloride (KLOR-CON) 20 MEQ packet Place 40 mEq into feeding tube daily for 3 days. (Patient not taking: Reported on 04/19/2022) 6 packet 0   Water For Irrigation, Sterile (FREE WATER) SOLN Place 250 mLs into feeding tube every 6 (six) hours. (Patient not taking: Reported on 04/19/2022)      Allergies as of 04/19/2022   (No Known Allergies)    Family History  Problem Relation Age of Onset   Colon cancer Mother    Kidney disease Mother    Cancer Father        unknown    Social History   Socioeconomic History   Marital status: Single    Spouse name: Not on file   Number of children: Not on file   Years of education: Not on file   Highest education level: Not on file  Occupational History   Not on file  Tobacco Use   Smoking status: Never   Smokeless tobacco: Never  Vaping Use   Vaping Use: Never used  Substance and Sexual Activity   Alcohol use: No    Alcohol/week: 0.0 standard drinks of alcohol   Drug use: No   Sexual activity: Not Currently  Other Topics Concern   Not on file  Social History Narrative   Not on file   Social Determinants of Health   Financial Resource Strain: Low Risk  (03/21/2022)   Overall Financial Resource Strain (CARDIA)    Difficulty of Paying Living Expenses: Not hard at all  Food Insecurity: No Food Insecurity (03/21/2022)   Hunger Vital Sign    Worried About Running Out of Food in the Last Year: Never true    [place] in the Last Year: Never true  Transportation Needs: No Transportation Needs (03/21/2022)   PRAPARE -  Hydrologist (Medical): No    Lack of Transportation (Non-Medical): No  Physical Activity: Insufficiently Active (03/21/2022)   Exercise Vital Sign    Days of Exercise per Week: 2 days    Minutes of Exercise per Session: 20 min  Stress: No Stress Concern Present (03/21/2022)   [place]    Feeling of Stress : Only a little  Social Connections: Socially Isolated (03/21/2022)   Social Connection and Isolation Panel [NHANES]    Frequency of Communication with Friends and Family: Once a week    Frequency of Social Gatherings with Friends and Family: More than three times a week    Attends Religious Services: Never    Marine scientist or Organizations: No    Attends Archivist Meetings: Never    Marital Status: Never married  Intimate Partner Violence: Not At Risk (03/21/2022)   Humiliation, Afraid, Rape, and Kick questionnaire    Fear of Current or Ex-Partner: No    Emotionally Abused: No    Physically Abused: No    Sexually Abused: No    Review of Systems: Pertinent positive and negative review of systems were noted in the above HPI section.  All other review of systems was otherwise negative.   Physical Exam: Vital signs in last 24 hours: Temp:  [93.9 F (34.4 C)-98 F (36.7 C)] 93.9 F (34.4 C) (12/14 0649) Pulse Rate:  [45-63] 57 (12/14 0800) Resp:  [12-20] 16 (12/14 0800) BP: (90-120)/(53-84) 93/63 (12/14 0800) SpO2:  [90 %-100 %] 98 % (12/14 0800) Weight:  [92.5 kg] 92.5 kg (12/13 1738) Last BM Date : 04/19/22 General:   Alert,  Well-developed, well-nourished, pleasant older white male  cooperative in NAD-O2 at 2 L sat 99 Head:  Normocephalic and atraumatic. Eyes:  Sclera clear, no icterus.   Conjunctiva pink. Ears:  Normal auditory acuity. Nose:  No deformity, discharge,  or lesions. Mouth:  No deformity or lesions.   Neck:  Supple; no masses or  thyromegaly. Lungs:  Clear throughout to auscultation.  No wheezes, crackles, or rhonchi. Heart:  Regular rate and rhythm; no murmurs, clicks, rubs,  or gallops. Abdomen:  Soft,nontender, BS active,nonpalp mass or hsm.  Large umbilical hernia nontender, gastrostomy tube in place Rectal: Not done-Hemoccult negative last p.m. Msk:  Symmetrical without gross deformities. . Pulses:  Normal pulses noted. Extremities:  Without clubbing or edema. Neurologic:  Alert and  oriented x 1 grossly normal neurologically.  Able to answer simple questions, pleasant Skin:  Intact without significant lesions or rashes.. Psych:  Alert and cooperative. Normal mood and affect.  Intake/Output from previous day: No intake/output data recorded. Intake/Output this shift: No intake/output data recorded.  Lab Results: Recent Labs    04/19/22 1748 04/19/22 2018 04/20/22 0138  WBC 4.6  --   --   HGB 10.7* 9.1* 9.0*  HCT 33.1* 27.1* 27.3*  PLT 196  --   --    BMET Recent Labs    04/19/22 1748 04/20/22 0138  NA 139 138  K 5.0 5.1  CL 102 106  CO2 30 27  GLUCOSE 94 83  BUN 35* 30*  CREATININE 1.03 1.05  CALCIUM 9.5 8.7*   LFT Recent Labs    04/20/22 0138  PROT 6.3*  ALBUMIN 2.7*  AST 33  ALT 29  ALKPHOS 173*  BILITOT 0.3   PT/INR Recent Labs    04/19/22 1748  LABPROT 12.0  INR 0.9   Hepatitis Panel No results for input(s): "HEPBSAG", "HCVAB", "HEPAIGM", "HEPBIGM" in the last 72 hours.   IMPRESSION:  #76 71 year old white male presenting with 2 episodes of coffee-ground emesis, onset last p.m. Globin 10.7 on arrival down to 9.0 this morning, this is in the setting of a chronic normocytic anemia. Patient had gastrostomy tube placed in July 2023.   Etiology of coffee-ground emesis is not clear, rule out esophagitis, ulcer disease, gastrostomy associated ulcer  #2 history of recurrent aspiration pneumonia, status post IR placed gastrostomy tube July 2023 And receives occasional  pleasure feedings otherwise tube feedings at facility  #3 obstructive sleep apnea 4.  Hypertension 5.  Hyperlipidemia #6 remote history of hyperplastic colon polyps-last colon 2013 #7 asymptomatic umbilical hernia    PLAN: Keep n.p.o. Start IV PPI twice daily Serial hemoglobins every 6 hours and transfuse for hemoglobin 7.5 or less  Patient will be scheduled for upper endoscopy with Dr. Havery Moros today.  I did discuss the procedure in detail with his guardian Oswaldo Conroy phone number [phone] who gives verbal consent.  Further recommendations pending findings at EGD   [place] PA-C 04/20/2022, 8:33 AM

## 2022-04-20 NOTE — Assessment & Plan Note (Signed)
Calculated BMI is 32,9  Consistent with obesity class 1

## 2022-04-20 NOTE — Anesthesia Postprocedure Evaluation (Signed)
Anesthesia Post Note  Patient: Daniel Bailey.  Procedure(s) Performed: ESOPHAGOGASTRODUODENOSCOPY (EGD) WITH PROPOFOL BIOPSY     Patient location during evaluation: Endoscopy Anesthesia Type: MAC Level of consciousness: awake Pain management: pain level controlled Vital Signs Assessment: post-procedure vital signs reviewed and stable Respiratory status: spontaneous breathing Cardiovascular status: stable Postop Assessment: no apparent nausea or vomiting Anesthetic complications: no   No notable events documented.  Last Vitals:  Vitals:   04/20/22 1045 04/20/22 1100  BP: (!) 90/54 (!) 97/59  Pulse: (!) 57 (!) 55  Resp: 15 14  Temp:    SpO2: 96% 97%    Last Pain:  Vitals:   04/20/22 1100  TempSrc:   PainSc: Asleep                 Marieann Zipp

## 2022-04-20 NOTE — ED Notes (Signed)
ED TO INPATIENT HANDOFF REPORT  ED Nurse Name and Phone #: [phone]  S Name/Age/Gender Daniel Bailey. 71 y.o. male Room/Bed: 045C/045C  Code Status   Code Status: DNR  Home/SNF/Other Home Patient oriented to: self Is this baseline? Yes   Triage Complete: Triage complete  Chief Complaint Acute upper GI bleed [K92.2]  Triage Note No notes on file   Allergies No Known Allergies  Level of Care/Admitting Diagnosis ED Disposition     ED Disposition  Admit   Condition  --   [facility]: [place] [[id number]]  Level of Care: Telemetry Medical [104]  May place patient in observation at [facility] or [place] if equivalent level of care is available:: Yes  Covid Evaluation: Asymptomatic - no recent exposure (last 10 days) testing not required  Diagnosis: Acute upper GI bleed [710626]  Admitting Physician: Vianne Bulls [[id number]]  Attending Physician: Vianne Bulls [[id number]]          B Medical/Surgery History Past Medical History:  Diagnosis Date   Anemia    Elevated PSA    Frequent urination    Glaucoma    History of COVID-19    Hyperglycemia    Hyperlipidemia    Hypertension    Sleep apnea    Past Surgical History:  Procedure Laterality Date   COLONOSCOPY     IR GASTROSTOMY TUBE MOD SED  11/10/2021   PROSTATE BIOPSY N/A 11/20/2017   Procedure: BIOPSY TRANSRECTAL ULTRASONIC PROSTATE (TUBP);  Surgeon: Abbie Sons, MD;  Location: [facility] ORS;  Service: Urology;  Laterality: N/A;     A IV Location/Drains/Wounds Patient Lines/Drains/Airways Status     Active Line/Drains/Airways     Name Placement date Placement time Site Days   Peripheral IV 04/19/22 20 G Left;Posterior Forearm 04/19/22  1800  Forearm  1            Intake/Output Last 24 hours No intake or output data in the 24 hours ending 04/20/22 0948  Labs/Imaging Results for orders placed or performed during the hospital encounter of 04/19/22 (from the past  48 hour(s))  Type and screen [place]     Status: None   Collection Time: 04/19/22  5:33 PM  Result Value Ref Range   ABO/RH(D) AB NEG    Antibody Screen NEG    Sample Expiration      04/22/2022,2359 Performed at [facility], [place] [address], [place], [place] [address]   Comprehensive metabolic panel     Status: Abnormal   Collection Time: 04/19/22  5:48 PM  Result Value Ref Range   Sodium 139 135 - 145 mmol/L   Potassium 5.0 3.5 - 5.1 mmol/L   Chloride 102 98 - 111 mmol/L   CO2 30 22 - 32 mmol/L   Glucose, Bld 94 70 - 99 mg/dL    Comment: Glucose reference range applies only to samples taken after fasting for at least 8 hours.   BUN 35 (H) 8 - 23 mg/dL   Creatinine, Ser 1.03 0.61 - 1.24 mg/dL   Calcium 9.5 8.9 - 10.3 mg/dL   Total Protein 7.1 6.5 - 8.1 g/dL   Albumin 3.1 (L) 3.5 - 5.0 g/dL   AST 36 15 - 41 U/L   ALT 34 0 - 44 U/L   Alkaline Phosphatase 201 (H) 38 - 126 U/L   Total Bilirubin 0.2 (L) 0.3 - 1.2 mg/dL   GFR, Estimated >60 >60 mL/min  Comment: (NOTE) Calculated using the CKD-EPI Creatinine Equation (2021)    Anion gap 7 5 - 15    Comment: Performed at [facility], [place] [address] [place], [place] [address]  CBC     Status: Abnormal   Collection Time: 04/19/22  5:48 PM  Result Value Ref Range   WBC 4.6 4.0 - 10.5 K/uL   RBC 3.33 (L) 4.22 - 5.81 MIL/uL   Hemoglobin 10.7 (L) 13.0 - 17.0 g/dL   HCT 33.1 (L) 39.0 - 52.0 %   MCV 99.4 80.0 - 100.0 fL   MCH 32.1 26.0 - 34.0 pg   MCHC 32.3 30.0 - 36.0 g/dL   RDW 16.6 (H) 11.5 - 15.5 %   Platelets 196 150 - 400 K/uL   nRBC 0.0 0.0 - 0.2 %    Comment: Performed at [facility] [address], [place], [place] [address]  Protime-INR     Status: None   Collection Time: 04/19/22  5:48 PM  Result Value Ref Range   Prothrombin Time 12.0 11.4 - 15.2 seconds   INR 0.9 0.8 - 1.2    Comment: (NOTE) INR goal varies based on device and disease states. Performed at [facility], [place] [address] [place], [place] [address]   Troponin I (High Sensitivity)     Status: None   Collection Time: 04/19/22  5:48 PM  Result Value Ref Range   Troponin I (High Sensitivity) 9 <18 ng/L    Comment: (NOTE) Elevated high sensitivity troponin I (hsTnI) values and significant  changes across serial measurements may suggest ACS but many other  chronic and acute conditions are known to elevate hsTnI results.  Refer to the "Links" section for chest pain algorithms and additional  guidance. Performed at [facility], [place] [address] [place], [place] [address]   Ammonia     Status: None   Collection Time: 04/19/22  7:11 PM  Result Value Ref Range   Ammonia 24 9 - 35 umol/L    Comment: Performed at [facility], [place] [address] [place], [place] [address]  Resp panel by RT-PCR (RSV, Flu A&B, Covid) Anterior Nasal Swab     Status: None   Collection Time: 04/19/22  8:08 PM   Specimen: Anterior Nasal Swab  Result Value Ref Range   SARS Coronavirus 2 by RT PCR NEGATIVE NEGATIVE    Comment: (NOTE) SARS-CoV-2 target nucleic acids are NOT DETECTED.  The SARS-CoV-2 RNA is generally detectable in upper respiratory specimens during the acute phase of infection. The lowest concentration of SARS-CoV-2 viral copies this assay can detect is 138 copies/mL. A negative result does not preclude SARS-Cov-2 infection and should not be used as the sole basis for treatment or other patient management decisions. A negative result may occur with  improper specimen collection/handling, submission of specimen other than nasopharyngeal swab, presence of viral mutation(s) within the areas targeted by this assay, and inadequate number of viral copies(<138 copies/mL). A negative result must be combined with clinical observations, patient history, and epidemiological information. The expected result is Negative.  Fact Sheet for Patients:   EntrepreneurPulse.com.au  Fact Sheet for Healthcare Providers:  IncredibleEmployment.be  This test is no t yet approved or cleared by the [place] FDA and  has been authorized for detection and/or diagnosis of SARS-CoV-2 by FDA under an Emergency Use Authorization (EUA). This EUA will remain  in effect (meaning this test can be used) for the duration  of the COVID-19 declaration under Section 564(b)(1) of the Act, 21 U.S.C.section 360bbb-3(b)(1), unless the authorization is terminated  or revoked sooner.       Influenza A by PCR NEGATIVE NEGATIVE   Influenza B by PCR NEGATIVE NEGATIVE    Comment: (NOTE) The Xpert Xpress SARS-CoV-2/FLU/RSV plus assay is intended as an aid in the diagnosis of influenza from Nasopharyngeal swab specimens and should not be used as a sole basis for treatment. Nasal washings and aspirates are unacceptable for Xpert Xpress SARS-CoV-2/FLU/RSV testing.  Fact Sheet for Patients: EntrepreneurPulse.com.au  Fact Sheet for Healthcare Providers: IncredibleEmployment.be  This test is not yet approved or cleared by the [place] FDA and has been authorized for detection and/or diagnosis of SARS-CoV-2 by FDA under an Emergency Use Authorization (EUA). This EUA will remain in effect (meaning this test can be used) for the duration of the COVID-19 declaration under Section 564(b)(1) of the Act, 21 U.S.C. section 360bbb-3(b)(1), unless the authorization is terminated or revoked.     Resp Syncytial Virus by PCR NEGATIVE NEGATIVE    Comment: (NOTE) Fact Sheet for Patients: EntrepreneurPulse.com.au  Fact Sheet for Healthcare Providers: IncredibleEmployment.be  This test is not yet approved or cleared by the [place] FDA and has been authorized for detection and/or diagnosis of SARS-CoV-2 by FDA under an Emergency Use Authorization (EUA).  This EUA will remain in effect (meaning this test can be used) for the duration of the COVID-19 declaration under Section 564(b)(1) of the Act, 21 U.S.C. section 360bbb-3(b)(1), unless the authorization is terminated or revoked.  Performed at [facility], [place] [address] [place], [place] [address]   Hemoglobin     Status: Abnormal   Collection Time: 04/19/22  8:18 PM  Result Value Ref Range   Hemoglobin 9.1 (L) 13.0 - 17.0 g/dL    Comment: Performed at [facility], [place] [address], [place], [place] [address]  Hematocrit     Status: Abnormal   Collection Time: 04/19/22  8:18 PM  Result Value Ref Range   HCT 27.1 (L) 39.0 - 52.0 %    Comment: Performed at [facility], [place] [address] [place], [place] [address]  POC occult blood, ED     Status: None   Collection Time: 04/19/22  9:08 PM  Result Value Ref Range   Fecal Occult Bld NEGATIVE NEGATIVE  Comprehensive metabolic panel     Status: Abnormal   Collection Time: 04/20/22  1:38 AM  Result Value Ref Range   Sodium 138 135 - 145 mmol/L   Potassium 5.1 3.5 - 5.1 mmol/L   Chloride 106 98 - 111 mmol/L   CO2 27 22 - 32 mmol/L   Glucose, Bld 83 70 - 99 mg/dL    Comment: Glucose reference range applies only to samples taken after fasting for at least 8 hours.   BUN 30 (H) 8 - 23 mg/dL   Creatinine, Ser 1.05 0.61 - 1.24 mg/dL   Calcium 8.7 (L) 8.9 - 10.3 mg/dL   Total Protein 6.3 (L) 6.5 - 8.1 g/dL   Albumin 2.7 (L) 3.5 - 5.0 g/dL   AST 33 15 - 41 U/L   ALT 29 0 - 44 U/L   Alkaline Phosphatase 173 (H) 38 - 126 U/L   Total Bilirubin 0.3 0.3 - 1.2 mg/dL   GFR, Estimated >60 >60 mL/min    Comment: (NOTE) Calculated using the CKD-EPI Creatinine Equation (2021)    Anion gap 5 5 - 15  Comment: Performed at [facility], [place] [address] [place], [place] [address]  Hemoglobin     Status: Abnormal   Collection Time: 04/20/22  1:38 AM  Result Value Ref Range   Hemoglobin 9.0 (L) 13.0 - 17.0 g/dL    Comment:  Performed at [facility] [address], [place], [place] [address]  Hematocrit     Status: Abnormal   Collection Time: 04/20/22  1:38 AM  Result Value Ref Range   HCT 27.3 (L) 39.0 - 52.0 %    Comment: Performed at [facility] [address] [place], [place] [address]  CBG monitoring, ED     Status: None   Collection Time: 04/20/22  6:43 AM  Result Value Ref Range   Glucose-Capillary 82 70 - 99 mg/dL    Comment: Glucose reference range applies only to samples taken after fasting for at least 8 hours.  Hematocrit     Status: Abnormal   Collection Time: 04/20/22  8:55 AM  Result Value Ref Range   HCT 27.7 (L) 39.0 - 52.0 %    Comment: Performed at [facility] [address] [place], [place] [address]   CT ABDOMEN PELVIS W CONTRAST  Result Date: 04/19/2022 CLINICAL DATA:  Coffee ground emesis, diabetes EXAM: CT ABDOMEN AND PELVIS WITH CONTRAST TECHNIQUE: Multidetector CT imaging of the abdomen and pelvis was performed using the standard protocol following bolus administration of intravenous contrast. RADIATION DOSE REDUCTION: This exam was performed according to the departmental dose-optimization program which includes automated exposure control, adjustment of the mA and/or kV according to patient size and/or use of iterative reconstruction technique. CONTRAST:  68m OMNIPAQUE IOHEXOL 350 MG/ML SOLN COMPARISON:  07/12/2021, 04/19/2022 FINDINGS: Lower chest: Scattered hypoventilatory changes at the lung bases. No acute pleural or parenchymal lung disease. Hepatobiliary: Calcified gallstones layer dependently in the gallbladder. No evidence of acute cholecystitis. Hepatic cysts are unchanged. No biliary duct dilation. Pancreas: Unremarkable. No pancreatic ductal dilatation or surrounding inflammatory changes. Spleen: Normal in size without focal abnormality. Adrenals/Urinary Tract: Adrenal glands are unremarkable. Kidneys are normal, without renal calculi, focal lesion, or  hydronephrosis. Bladder is unremarkable. Stomach/Bowel: No bowel obstruction or ileus. The cecum is midline, with normal appendix extending into the left lower quadrant. No bowel wall thickening or inflammatory change. Percutaneous gastrostomy tube is seen within the gastric antrum. Small hiatal hernia. Vascular/Lymphatic: Aortic atherosclerosis. No enlarged abdominal or pelvic lymph nodes. Reproductive: Prostate is unremarkable. Other: No free fluid or free intraperitoneal gas. There is a large fat containing umbilical hernia and right inguinal hernia. No bowel herniation. Musculoskeletal: No acute or destructive bony lesions. Reconstructed images demonstrate no additional findings. IMPRESSION: 1. No bowel obstruction or ileus. 2. Cholelithiasis without cholecystitis. 3. Fat containing umbilical and right inguinal hernias. No bowel herniation. 4.  Aortic Atherosclerosis (ICD10-I70.0). Electronically Signed   By: MRanda NgoM.D.   On: 04/19/2022 22:44   DG Chest Port 1 View  Result Date: 04/19/2022 CLINICAL DATA:  Vomiting EXAM: PORTABLE CHEST 1 VIEW COMPARISON:  11/04/2021 FINDINGS: Transverse diameter of heart is slightly increased. There are no signs of pulmonary edema or focal pulmonary consolidation. There is no pleural effusion or pneumothorax. IMPRESSION: No active disease. Electronically Signed   By: PElmer PickerM.D.   On: 04/19/2022 18:32    Pending Labs Unresulted Labs (From admission, onward)     Start     Ordered   04/20/22 1416  Hemoglobin  Now then every 6  hours,   R (with TIMED occurrences)     Comments: Call MD for hgb less than 7.5    04/20/22 0905   04/20/22 0915  Gamma GT  Once,   R        04/20/22 0915   04/20/22 0915  Magnesium  Once,   R        04/20/22 0915   04/20/22 0500  Comprehensive metabolic panel  Daily,   R      04/19/22 2016   04/19/22 2016  Hematocrit  Now then every 6 hours,   R (with TIMED occurrences)      04/19/22 2016             Vitals/Pain Today's Vitals   04/20/22 0730 04/20/22 0800 04/20/22 0945 04/20/22 0947  BP: 95/60 93/63 112/63   Pulse: (!) 50 (!) 57 60   Resp: '13 16 16   '$ Temp:    (!) 97.2 F (36.2 C)  TempSrc:    Oral  SpO2: 97% 98% 99%   Weight:      Height:      PainSc:        Isolation Precautions No active isolations  Medications Medications  sodium chloride flush (NS) 0.9 % injection 3 mL (3 mLs Intravenous Not Given 04/20/22 0842)  acetaminophen (TYLENOL) tablet 650 mg (has no administration in time range)    Or  acetaminophen (TYLENOL) suppository 650 mg (has no administration in time range)  0.9 %  sodium chloride infusion (0 mLs Intravenous Paused 04/20/22 0854)  ondansetron (ZOFRAN) tablet 4 mg (has no administration in time range)    Or  ondansetron (ZOFRAN) injection 4 mg (has no administration in time range)  pantoprazole (PROTONIX) injection 40 mg (has no administration in time range)  sodium chloride 0.9 % bolus 1,000 mL (0 mLs Intravenous Stopped 04/19/22 2014)  pantoprazole (PROTONIX) injection 40 mg (40 mg Intravenous Given 04/19/22 1912)  ondansetron (ZOFRAN) injection 4 mg (4 mg Intravenous Given 04/19/22 2006)  iohexol (OMNIPAQUE) 350 MG/ML injection 75 mL (75 mLs Intravenous Contrast Given 04/19/22 2214)    Mobility non-ambulatory High fall risk   Focused Assessments    R Recommendations: See Admitting Provider Note  Report given to:   Additional Notes:

## 2022-04-20 NOTE — ED Notes (Signed)
GI at bedside

## 2022-04-20 NOTE — Hospital Course (Addendum)
Mrs. Daniel Bailey was admitted to the hospital with the working diagnosis of upper GI bleed.   71 yo male with the past medical history of T2DM, cognitive impairment, and dysphagia, recurrent aspiration pneumonia, sp PEG tube who presented with coffee ground emesis. He lives at [facility] where he was noted to have vomiting that converted in hematemesis. EMS was called, patient received ondansetron and was transported to the ED. On his initial physical examination his blood pressure was 107/74, HR 61 RR 17 02 saturation 96%, lungs with no rales or wheezing, heart with S1 and S2 present and rhythmic, abdomen with no distention or tenderness and no lower extremity edema.   Na 139, K 5.0 Cl 102 bicarbonate 30 glucose 94 bun 35 cr 1,0  Wbc 4.6 hgb 10,7 plt 196  Sars covid 19 negative   Chest radiograph with no infiltrates or effusions.  EKG 60 bpm, normal axis, right bundle branch block, sinus rhythm with q wave lead II, III, AvF, V3 to V6, with no significant ST segment or T wave changes.   CT abdomen and pelvis with no bowel obstruction or ileus.  Cholelithiasis without cholecystitis.  Umbilical and right inguinal hernia.   Patient was placed on proton pump inhibitors and consulted GI  12/14 upper endoscopy with LA grade D reflux esophagitis with no active bleeding. Mild scattered gastritis, erythematous duodenopathy with no bleeding.  Resumed tube feedings with good toleration.   12/15 return to SNF. Continue antiacid therapy and change feedings to bolus.  Head elevated 45 degrees at all times to prevent reflux.  Follow up with Gastroenterology as outpatient.

## 2022-04-20 NOTE — ED Notes (Signed)
Patient transported to MRI 

## 2022-04-20 NOTE — Assessment & Plan Note (Addendum)
EGD today with acute esophagitis. (Biopsies taken).  No further active bleeding, follow up hgb is 8,5.   Patient was placed on IV pantoprazole during his hospitalization. At the time of his discharge will be transitioned to enteral omeprazole 40 mg bid for 8 weeks and then daily.  Changed tube feedings to bolus and keep head elevated 45 degrees at all times.   Acute blood loss anemia due to upper GI bleed.  Hgb is 8,7 from admission 10,7. No signs of ongoing bleeding. Plan to follow up cell count as outpatient.

## 2022-04-20 NOTE — ED Notes (Signed)
RN changed pt linens, gown, and brief.

## 2022-04-20 NOTE — Assessment & Plan Note (Addendum)
No agitation. Patient will return to SNF today for further care.

## 2022-04-20 NOTE — Assessment & Plan Note (Signed)
Continue blood pressure support with midodrine 5 mg po tid.

## 2022-04-20 NOTE — ED Notes (Signed)
RN adjusted pt in recliner

## 2022-04-21 LAB — CBC
HCT: 26 % — ABNORMAL LOW (ref 39.0–52.0)
Hemoglobin: 8.5 g/dL — ABNORMAL LOW (ref 13.0–17.0)
MCH: 32.7 pg (ref 26.0–34.0)
MCHC: 32.7 g/dL (ref 30.0–36.0)
MCV: 100 fL (ref 80.0–100.0)
Platelets: 162 10*3/uL (ref 150–400)
RBC: 2.6 MIL/uL — ABNORMAL LOW (ref 4.22–5.81)
RDW: 17 % — ABNORMAL HIGH (ref 11.5–15.5)
WBC: 3.9 10*3/uL — ABNORMAL LOW (ref 4.0–10.5)
nRBC: 0.8 % — ABNORMAL HIGH (ref 0.0–0.2)

## 2022-04-21 LAB — IRON AND TIBC
Iron: 75 ug/dL (ref 45–182)
Saturation Ratios: 24 % (ref 17.9–39.5)
TIBC: 311 ug/dL (ref 250–450)
UIBC: 236 ug/dL

## 2022-04-21 LAB — BASIC METABOLIC PANEL
Anion gap: 6 (ref 5–15)
BUN: 25 mg/dL — ABNORMAL HIGH (ref 8–23)
CO2: 25 mmol/L (ref 22–32)
Calcium: 8.6 mg/dL — ABNORMAL LOW (ref 8.9–10.3)
Chloride: 106 mmol/L (ref 98–111)
Creatinine, Ser: 1.26 mg/dL — ABNORMAL HIGH (ref 0.61–1.24)
GFR, Estimated: >60 mL/min (ref >60)
Glucose, Bld: 126 mg/dL — ABNORMAL HIGH (ref 70–99)
Potassium: 4.8 mmol/L (ref 3.5–5.1)
Sodium: 137 mmol/L (ref 135–145)

## 2022-04-21 LAB — FERRITIN: Ferritin: 299 ng/mL (ref 24–336)

## 2022-04-21 LAB — TRANSFERRIN: Transferrin: 219 mg/dL (ref 180–329)

## 2022-04-21 MED ORDER — OMEPRAZOLE 2 MG/ML ORAL SUSPENSION: 40.0000 mg | Freq: Two times a day (BID) | ORAL | 0 refills | Status: DC

## 2022-04-21 MED ORDER — JEVITY 1.5 CAL/FIBER PO LIQD: 237.0000 mL | Freq: Every day | ORAL | 0 refills | Status: AC

## 2022-04-21 MED ORDER — PROSOURCE TF20 ENFIT COMPATIBL EN LIQD: 60.0000 mL | Freq: Every day | ENTERAL | 0 refills | Status: AC

## 2022-04-21 MED ORDER — FREE WATER
225.0000 mL | Freq: Four times a day (QID) | Status: DC
  Administered 2022-04-21: 225 mL

## 2022-04-21 MED ORDER — FREE WATER: 225.0000 mL | Freq: Four times a day (QID) | 0 refills | Status: AC

## 2022-04-21 MED ORDER — JEVITY 1.5 CAL/FIBER PO LIQD
237.0000 mL | Freq: Every day | ORAL | Status: DC
  Administered 2022-04-21: 237 mL
  Filled 2022-04-21 (×3): qty 237

## 2022-04-21 NOTE — TOC Transition Note (Signed)
Transition of Care [facility]) - CM/SW Discharge Note   Patient Details  Name: Daniel Bailey. MRN: [id number] Date of Birth: 04/23/1951  [facility] CM/SW Contact:  Coralee Pesa, [place] Phone Number: 04/21/2022, 11:30 AM   Clinical Narrative:    Pt to be transported to [facility] via [place]. Nurse to call report to [phone]. LG requests a call when transport picks up pt.   Final next level of care: [place] Barriers to Discharge: Barriers Resolved   Patient Goals and CMS Choice        Discharge Placement              Patient chooses bed at: [facility] Patient to be transferred to facility by: [place] Name of family member notified: Mcneil Sober Patient and family notified of of transfer: 04/21/22  Discharge Plan and Services                                     Social Determinants of Health (SDOH) Interventions     Readmission Risk Interventions     No data to display

## 2022-04-21 NOTE — Progress Notes (Signed)
Initial Nutrition Assessment  DOCUMENTATION CODES:   Not applicable  INTERVENTION:  Transition TF to bolus feeding via PEG tube: 1 carton Jevity 1.5 (239m) 5 times per day  684mProsource TF20 once daily Free water flushes of 22519m6h  Provides 1855 kcal, 96 gm protein, 1800 ml free water daily  NUTRITION DIAGNOSIS:   Inadequate oral intake related to dysphagia as evidenced by NPO status.  GOAL:   Patient will meet greater than or equal to 90% of their needs  MONITOR:   Labs, Weight trends, TF tolerance  REASON FOR ASSESSMENT:   Consult Enteral/tube feeding initiation and management (Recommendation for bolus feeding)  ASSESSMENT:   Pt admitted with n/v and coffee ground emesis r/t acute upper GIB. PMH significant for intellectual disability, diet-controlled diabetes and dysphagia with recurrent aspiration PNA s/p PEG tube placement.  12/14 s/p upper GI EGD- findings of reflux esophagitis with no active bleeding  [facility]   Spoke with pt at bedside. He reports that PTA he was not receiving PO. He is uncertain of his prior TF regimen outpatient. He states that his emesis comes and goes. He endorses regular bowel movements.   GI recommending pantoprazole. MD wants to trial bolus feeds to see if this will help with reflux improvement. Adjusted TF regimen this morning and first bolus given around 0930am. Pt denies emesis or nausea with first bolus.   Reviewed weight history. It appears that he has had a weight loss of 6.8% within the last month which is clinically significant for time frame  Medications: protonix  Labs: BUN 25, Cr 1.26   NUTRITION - FOCUSED PHYSICAL EXAM:  Flowsheet Row Most Recent Value  Orbital Region Mild depletion  Upper Arm Region No depletion  Thoracic and Lumbar Region No depletion  Buccal Region No depletion  Temple Region Mild depletion  Clavicle Bone Region No depletion  Clavicle and Acromion Bone Region  No depletion  Scapular Bone Region No depletion  Dorsal Hand No depletion  Patellar Region Mild depletion  Anterior Thigh Region Mild depletion  Posterior Calf Region Moderate depletion  Edema (RD Assessment) None  Hair Reviewed  Eyes Reviewed  Mouth Reviewed  Skin Reviewed  Nails Reviewed      Diet Order:   Diet Order             Diet - low sodium heart healthy           Diet NPO time specified  Diet effective now                   EDUCATION NEEDS:   No education needs have been identified at this time  Skin:  Skin Assessment: Reviewed RN Assessment  Last BM:  12/13  Height:   Ht Readings from Last 1 Encounters:  04/20/22 '5\' 6"'$  (1.676 m)    Weight:   Wt Readings from Last 1 Encounters:  04/21/22 86.2 kg   BMI:  Body mass index is 30.67 kg/m.  Estimated Nutritional Needs:   Kcal:  1800-2000  Protein:  90-100g  Fluid:  >/=1.8L  AllClayborne DanaDN, LDN Clinical Nutrition

## 2022-04-21 NOTE — Progress Notes (Signed)
RN called [name] and left a message withy Tiffany the secretary because no one was answering at the nurses station she stated she would let them know to call me back

## 2022-04-21 NOTE — Discharge Summary (Signed)
Physician Discharge Summary   Patient: Daniel Bailey. MRN: [id number] DOB: [birth year]  Admit date:     04/19/2022  Discharge date: 04/21/22  Discharge Physician: [name] [name]   PCP: [name], MD   Recommendations at discharge:    Patient has been placed on enteral omeprazole 40 mg bid for 8 weeks and then continue with 40 mg daily. Keep head elevated 45 degrees at all times.  Changed feeding to bolus to decrease reflux.  Follow up iron panel in 2 to 3 weeks. Follow up renal function and electrolytes in 7 days.  Follow up with Dr [name] in 7 to 10 days. Follow up with GI as outpatient for biopsy results.   Discharge Diagnoses: Principal Problem:   Acute upper GI bleed Active Problems:   Intellectual disability   Iron deficiency anemia   Obesity   Hypotension  Resolved Problems:   * No resolved hospital problems. [facility] Course: Daniel Bailey was admitted to the hospital with the working diagnosis of upper GI bleed.   71 yo male with the past medical history of T2DM, cognitive impairment, and dysphagia, recurrent aspiration pneumonia, sp PEG tube who presented with coffee ground emesis. He lives at [facility] where he was noted to have vomiting that converted in hematemesis. EMS was called, patient received ondansetron and was transported to the ED. On his initial physical examination his blood pressure was 107/74, HR 61 RR 17 02 saturation 96%, lungs with no rales or wheezing, heart with S1 and S2 present and rhythmic, abdomen with no distention or tenderness and no lower extremity edema.   Na 139, K 5.0 Cl 102 bicarbonate 30 glucose 94 bun 35 cr 1,0  Wbc 4.6 hgb 10,7 plt 196  Sars covid 19 negative   Chest radiograph with no infiltrates or effusions.  EKG 60 bpm, normal axis, right bundle branch block, sinus rhythm with q wave lead II, III, AvF, V3 to V6, with no significant ST segment or T wave changes.   CT abdomen and pelvis with no bowel obstruction or ileus.   Cholelithiasis without cholecystitis.  Umbilical and right inguinal hernia.   Patient was placed on proton pump inhibitors and consulted GI  12/14 upper endoscopy with LA grade D reflux esophagitis with no active bleeding. Mild scattered gastritis, erythematous duodenopathy with no bleeding.  Resumed tube feedings with good toleration.   12/15 return to SNF. Continue antiacid therapy and change feedings to bolus.  Head elevated 45 degrees at all times to prevent reflux.  Follow up with Gastroenterology as outpatient.    Assessment and Plan: * Acute upper GI bleed EGD today with acute esophagitis.  No further active bleeding, follow up hgb is 8,5.   Patient was placed on IV pantoprazole during his hospitalization. At the time of his discharge will be transitioned to enteral omeprazole 40 mg bid for 8 weeks and then daily.  Changed tube feedings to bolus and keep head elevated 45 degrees at all times.   Acute blood loss anemia due to upper GI bleed.  Hgb is 8,7 from admission 10,7. No signs of ongoing bleeding. Plan to follow up cell count as outpatient.   Intellectual disability No agitation. Patient will return to SNF today for further care.   Iron deficiency anemia Follow up iron panel with serum iron 75, transferrin saturation is 24, TIBC 311 and ferritin 299. Consistent with anemia of chronic disease.  Because adequate iron stores will hold on further iron supplementation.  Plan  for follow up iron panel in 2 to 3 weeks as outpatient.   Obesity Calculated BMI is 32,9  Consistent with obesity class 1  Hypotension Continue blood pressure support with midodrine 5 mg po tid.          Consultants: GI  Procedures performed: EGD   Disposition: Skilled nursing facility Diet recommendation:  NPO tube feedings.  DISCHARGE MEDICATION: Allergies as of 04/21/2022   No Known Allergies      Medication List     STOP taking these medications    ferrous sulfate 220  (44 Fe) MG/5ML solution   ferrous sulfate 300 (60 Fe) MG/5ML syrup   pantoprazole sodium 40 mg Commonly known as: PROTONIX   potassium chloride 20 MEQ packet Commonly known as: Klor-Con       TAKE these medications    acetaminophen 500 MG tablet Commonly known as: TYLENOL Place 2 tablets (1,000 mg total) into feeding tube every 8 (eight) hours as needed for moderate pain.   AMINO ACIDS-PROTEIN HYDROLYS PO 45 mLs by PEG Tube route 2 (two) times daily.   feeding supplement (JEVITY 1.5 CAL/FIBER) Liqd Place 237 mLs into feeding tube 5 (five) times daily. What changed:  how much to take when to take this Another medication with the same name was removed. Continue taking this medication, and follow the directions you see here.   feeding supplement (PROSource TF20) liquid Place 60 mLs into feeding tube daily. Start taking on: April 22, 2022   free water Soln Place 225 mLs into feeding tube every 6 (six) hours. What changed: how much to take   midodrine 5 MG tablet Commonly known as: PROAMATINE Place 1 tablet (5 mg total) into feeding tube 3 (three) times daily with meals.   omeprazole 2 mg/mL Susp oral suspension Commonly known as: KONVOMEP Place 20 mLs (40 mg total) into feeding tube 2 (two) times daily before a meal.   ondansetron 4 MG disintegrating tablet Commonly known as: ZOFRAN-ODT Take 4 mg by mouth every 8 (eight) hours as needed for nausea or vomiting.        Discharge Exam: Filed Weights   04/19/22 1738 04/20/22 1010 04/21/22 0500  Weight: 92.5 kg 92.5 kg 86.2 kg   BP (!) 93/55 (BP Location: Left Arm)   Pulse (!) 52   Temp (!) 97.5 F (36.4 C)   Resp 16   Ht '5\' 6"'$  (1.676 m)   Wt 86.2 kg   SpO2 100%   BMI 30.67 kg/m   Patient is feeling better, tolerating tube feedings well with no nausea or vomiting, no hematemesis.   Neurology awake and alert ENT with mild pallor Cardiovascular with S1 and S2 present and rhythmic Respiratory with no  rales or wheezing Abdomen with no distention  No lower extremity edema   Condition at discharge: stable  The results of significant diagnostics from this hospitalization (including imaging, microbiology, ancillary and laboratory) are listed below for reference.   Imaging Studies: CT ABDOMEN PELVIS W CONTRAST  Result Date: 04/19/2022 CLINICAL DATA:  Coffee ground emesis, diabetes EXAM: CT ABDOMEN AND PELVIS WITH CONTRAST TECHNIQUE: Multidetector CT imaging of the abdomen and pelvis was performed using the standard protocol following bolus administration of intravenous contrast. RADIATION DOSE REDUCTION: This exam was performed according to the departmental dose-optimization program which includes automated exposure control, adjustment of the mA and/or kV according to patient size and/or use of iterative reconstruction technique. CONTRAST:  56m OMNIPAQUE IOHEXOL 350 MG/ML SOLN COMPARISON:  07/12/2021, 04/19/2022 FINDINGS:  Lower chest: Scattered hypoventilatory changes at the lung bases. No acute pleural or parenchymal lung disease. Hepatobiliary: Calcified gallstones layer dependently in the gallbladder. No evidence of acute cholecystitis. Hepatic cysts are unchanged. No biliary duct dilation. Pancreas: Unremarkable. No pancreatic ductal dilatation or surrounding inflammatory changes. Spleen: Normal in size without focal abnormality. Adrenals/Urinary Tract: Adrenal glands are unremarkable. Kidneys are normal, without renal calculi, focal lesion, or hydronephrosis. Bladder is unremarkable. Stomach/Bowel: No bowel obstruction or ileus. The cecum is midline, with normal appendix extending into the left lower quadrant. No bowel wall thickening or inflammatory change. Percutaneous gastrostomy tube is seen within the gastric antrum. Small hiatal hernia. Vascular/Lymphatic: Aortic atherosclerosis. No enlarged abdominal or pelvic lymph nodes. Reproductive: Prostate is unremarkable. Other: No free fluid or free  intraperitoneal gas. There is a large fat containing umbilical hernia and right inguinal hernia. No bowel herniation. Musculoskeletal: No acute or destructive bony lesions. Reconstructed images demonstrate no additional findings. IMPRESSION: 1. No bowel obstruction or ileus. 2. Cholelithiasis without cholecystitis. 3. Fat containing umbilical and right inguinal hernias. No bowel herniation. 4.  Aortic Atherosclerosis (ICD10-I70.0). Electronically Signed   By: Randa Ngo M.D.   On: 04/19/2022 22:44   DG Chest Port 1 View  Result Date: 04/19/2022 CLINICAL DATA:  Vomiting EXAM: PORTABLE CHEST 1 VIEW COMPARISON:  11/04/2021 FINDINGS: Transverse diameter of heart is slightly increased. There are no signs of pulmonary edema or focal pulmonary consolidation. There is no pleural effusion or pneumothorax. IMPRESSION: No active disease. Electronically Signed   By: Elmer Picker M.D.   On: 04/19/2022 18:32    Microbiology: Results for orders placed or performed during the hospital encounter of 04/19/22  Resp panel by RT-PCR (RSV, Flu A&B, Covid) Anterior Nasal Swab     Status: None   Collection Time: 04/19/22  8:08 PM   Specimen: Anterior Nasal Swab  Result Value Ref Range Status   SARS Coronavirus 2 by RT PCR NEGATIVE NEGATIVE Final    Comment: (NOTE) SARS-CoV-2 target nucleic acids are NOT DETECTED.  The SARS-CoV-2 RNA is generally detectable in upper respiratory specimens during the acute phase of infection. The lowest concentration of SARS-CoV-2 viral copies this assay can detect is 138 copies/mL. A negative result does not preclude SARS-Cov-2 infection and should not be used as the sole basis for treatment or other patient management decisions. A negative result may occur with  improper specimen collection/handling, submission of specimen other than nasopharyngeal swab, presence of viral mutation(s) within the areas targeted by this assay, and inadequate number of viral copies(<138  copies/mL). A negative result must be combined with clinical observations, patient history, and epidemiological information. The expected result is Negative.  Fact Sheet for Patients:  EntrepreneurPulse.com.au  Fact Sheet for Healthcare Providers:  IncredibleEmployment.be  This test is no t yet approved or cleared by the [place] FDA and  has been authorized for detection and/or diagnosis of SARS-CoV-2 by FDA under an Emergency Use Authorization (EUA). This EUA will remain  in effect (meaning this test can be used) for the duration of the COVID-19 declaration under Section 564(b)(1) of the Act, 21 U.S.C.section 360bbb-3(b)(1), unless the authorization is terminated  or revoked sooner.       Influenza A by PCR NEGATIVE NEGATIVE Final   Influenza B by PCR NEGATIVE NEGATIVE Final    Comment: (NOTE) The Xpert Xpress SARS-CoV-2/FLU/RSV plus assay is intended as an aid in the diagnosis of influenza from Nasopharyngeal swab specimens and should not be used as a sole basis  for treatment. Nasal washings and aspirates are unacceptable for Xpert Xpress SARS-CoV-2/FLU/RSV testing.  Fact Sheet for Patients: EntrepreneurPulse.com.au  Fact Sheet for Healthcare Providers: IncredibleEmployment.be  This test is not yet approved or cleared by the [place] FDA and has been authorized for detection and/or diagnosis of SARS-CoV-2 by FDA under an Emergency Use Authorization (EUA). This EUA will remain in effect (meaning this test can be used) for the duration of the COVID-19 declaration under Section 564(b)(1) of the Act, 21 U.S.C. section 360bbb-3(b)(1), unless the authorization is terminated or revoked.     Resp Syncytial Virus by PCR NEGATIVE NEGATIVE Final    Comment: (NOTE) Fact Sheet for Patients: EntrepreneurPulse.com.au  Fact Sheet for Healthcare  Providers: IncredibleEmployment.be  This test is not yet approved or cleared by the [place] FDA and has been authorized for detection and/or diagnosis of SARS-CoV-2 by FDA under an Emergency Use Authorization (EUA). This EUA will remain in effect (meaning this test can be used) for the duration of the COVID-19 declaration under Section 564(b)(1) of the Act, 21 U.S.C. section 360bbb-3(b)(1), unless the authorization is terminated or revoked.  Performed at [facility], [place] [address] [place], [place] [address]     Labs: CBC: Recent Labs  Lab 04/19/22 1748 04/19/22 2018 04/20/22 0138 04/20/22 0855 04/20/22 1358 04/21/22 0329  WBC 4.6  --   --   --   --  3.9*  HGB 10.7* 9.1* 9.0*  --  8.6* 8.5*  HCT 33.1* 27.1* 27.3* 27.7* 26.5* 26.0*  MCV 99.4  --   --   --   --  100.0  PLT 196  --   --   --   --  103   Basic Metabolic Panel: Recent Labs  Lab 04/19/22 1748 04/20/22 0138 04/21/22 0329  NA 139 138 137  K 5.0 5.1 4.8  CL 102 106 106  CO2 '30 27 25  '$ GLUCOSE 94 83 126*  BUN 35* 30* 25*  CREATININE 1.03 1.05 1.26*  CALCIUM 9.5 8.7* 8.6*  MG  --  2.1  --    Liver Function Tests: Recent Labs  Lab 04/19/22 1748 04/20/22 0138  AST 36 33  ALT 34 29  ALKPHOS 201* 173*  BILITOT 0.2* 0.3  PROT 7.1 6.3*  ALBUMIN 3.1* 2.7*   CBG: Recent Labs  Lab 04/20/22 0643  GLUCAP 82    Discharge time spent: greater than 30 minutes.  Signed: Tawni Millers, MD [facility] 04/21/2022

## 2022-04-21 NOTE — Progress Notes (Signed)
PTAR at bedside and RN called Guardian and notified her of PT DC

## 2022-04-23 ENCOUNTER — Encounter (HOSPITAL_COMMUNITY): Payer: Self-pay | Admitting: Internal Medicine

## 2022-04-24 LAB — SURGICAL PATHOLOGY

## 2022-04-25 ENCOUNTER — Encounter: Payer: Self-pay | Admitting: Internal Medicine

## 2022-04-25 DIAGNOSIS — D508 Other iron deficiency anemias: Secondary | ICD-10-CM | POA: Diagnosis not present

## 2022-04-25 DIAGNOSIS — L84 Corns and callosities: Secondary | ICD-10-CM | POA: Diagnosis not present

## 2022-04-25 DIAGNOSIS — D500 Iron deficiency anemia secondary to blood loss (chronic): Secondary | ICD-10-CM | POA: Diagnosis not present

## 2022-04-25 DIAGNOSIS — E02 Subclinical iodine-deficiency hypothyroidism: Secondary | ICD-10-CM | POA: Diagnosis not present

## 2022-04-25 DIAGNOSIS — N182 Chronic kidney disease, stage 2 (mild): Secondary | ICD-10-CM | POA: Diagnosis not present

## 2022-04-25 DIAGNOSIS — K219 Gastro-esophageal reflux disease without esophagitis: Secondary | ICD-10-CM | POA: Diagnosis not present

## 2022-04-25 DIAGNOSIS — R001 Bradycardia, unspecified: Secondary | ICD-10-CM | POA: Diagnosis not present

## 2022-04-25 DIAGNOSIS — M6281 Muscle weakness (generalized): Secondary | ICD-10-CM | POA: Diagnosis not present

## 2022-04-25 DIAGNOSIS — I739 Peripheral vascular disease, unspecified: Secondary | ICD-10-CM | POA: Diagnosis not present

## 2022-04-25 DIAGNOSIS — Z23 Encounter for immunization: Secondary | ICD-10-CM | POA: Diagnosis not present

## 2022-04-25 DIAGNOSIS — E1122 Type 2 diabetes mellitus with diabetic chronic kidney disease: Secondary | ICD-10-CM | POA: Diagnosis not present

## 2022-04-25 DIAGNOSIS — K2091 Esophagitis, unspecified with bleeding: Secondary | ICD-10-CM | POA: Diagnosis not present

## 2022-04-25 DIAGNOSIS — K922 Gastrointestinal hemorrhage, unspecified: Secondary | ICD-10-CM | POA: Diagnosis not present

## 2022-04-25 DIAGNOSIS — L603 Nail dystrophy: Secondary | ICD-10-CM | POA: Diagnosis not present

## 2022-04-26 DIAGNOSIS — E1122 Type 2 diabetes mellitus with diabetic chronic kidney disease: Secondary | ICD-10-CM | POA: Diagnosis not present

## 2022-04-26 DIAGNOSIS — E02 Subclinical iodine-deficiency hypothyroidism: Secondary | ICD-10-CM | POA: Diagnosis not present

## 2022-04-26 DIAGNOSIS — K2091 Esophagitis, unspecified with bleeding: Secondary | ICD-10-CM | POA: Diagnosis not present

## 2022-04-26 DIAGNOSIS — N182 Chronic kidney disease, stage 2 (mild): Secondary | ICD-10-CM | POA: Diagnosis not present

## 2022-04-26 DIAGNOSIS — R001 Bradycardia, unspecified: Secondary | ICD-10-CM | POA: Diagnosis not present

## 2022-04-26 DIAGNOSIS — K2101 Gastro-esophageal reflux disease with esophagitis, with bleeding: Secondary | ICD-10-CM | POA: Diagnosis not present

## 2022-04-26 DIAGNOSIS — M6281 Muscle weakness (generalized): Secondary | ICD-10-CM | POA: Diagnosis not present

## 2022-04-26 DIAGNOSIS — D500 Iron deficiency anemia secondary to blood loss (chronic): Secondary | ICD-10-CM | POA: Diagnosis not present

## 2022-04-26 DIAGNOSIS — K922 Gastrointestinal hemorrhage, unspecified: Secondary | ICD-10-CM | POA: Diagnosis not present

## 2022-04-28 DIAGNOSIS — N182 Chronic kidney disease, stage 2 (mild): Secondary | ICD-10-CM | POA: Diagnosis not present

## 2022-04-28 DIAGNOSIS — K922 Gastrointestinal hemorrhage, unspecified: Secondary | ICD-10-CM | POA: Diagnosis not present

## 2022-04-28 DIAGNOSIS — E559 Vitamin D deficiency, unspecified: Secondary | ICD-10-CM | POA: Diagnosis not present

## 2022-04-28 DIAGNOSIS — R001 Bradycardia, unspecified: Secondary | ICD-10-CM | POA: Diagnosis not present

## 2022-04-28 DIAGNOSIS — E109 Type 1 diabetes mellitus without complications: Secondary | ICD-10-CM | POA: Diagnosis not present

## 2022-04-28 DIAGNOSIS — E1122 Type 2 diabetes mellitus with diabetic chronic kidney disease: Secondary | ICD-10-CM | POA: Diagnosis not present

## 2022-04-28 DIAGNOSIS — K2091 Esophagitis, unspecified with bleeding: Secondary | ICD-10-CM | POA: Diagnosis not present

## 2022-04-28 DIAGNOSIS — I10 Essential (primary) hypertension: Secondary | ICD-10-CM | POA: Diagnosis not present

## 2022-04-28 DIAGNOSIS — M6281 Muscle weakness (generalized): Secondary | ICD-10-CM | POA: Diagnosis not present

## 2022-04-28 DIAGNOSIS — E02 Subclinical iodine-deficiency hypothyroidism: Secondary | ICD-10-CM | POA: Diagnosis not present

## 2022-04-28 DIAGNOSIS — D500 Iron deficiency anemia secondary to blood loss (chronic): Secondary | ICD-10-CM | POA: Diagnosis not present

## 2022-04-28 DIAGNOSIS — D508 Other iron deficiency anemias: Secondary | ICD-10-CM | POA: Diagnosis not present

## 2022-04-28 DIAGNOSIS — D518 Other vitamin B12 deficiency anemias: Secondary | ICD-10-CM | POA: Diagnosis not present

## 2022-04-28 DIAGNOSIS — K219 Gastro-esophageal reflux disease without esophagitis: Secondary | ICD-10-CM | POA: Diagnosis not present

## 2022-05-02 DIAGNOSIS — R001 Bradycardia, unspecified: Secondary | ICD-10-CM | POA: Diagnosis not present

## 2022-05-02 DIAGNOSIS — K922 Gastrointestinal hemorrhage, unspecified: Secondary | ICD-10-CM | POA: Diagnosis not present

## 2022-05-02 DIAGNOSIS — E1122 Type 2 diabetes mellitus with diabetic chronic kidney disease: Secondary | ICD-10-CM | POA: Diagnosis not present

## 2022-05-02 DIAGNOSIS — N182 Chronic kidney disease, stage 2 (mild): Secondary | ICD-10-CM | POA: Diagnosis not present

## 2022-05-02 DIAGNOSIS — E02 Subclinical iodine-deficiency hypothyroidism: Secondary | ICD-10-CM | POA: Diagnosis not present

## 2022-05-02 DIAGNOSIS — K2091 Esophagitis, unspecified with bleeding: Secondary | ICD-10-CM | POA: Diagnosis not present

## 2022-05-02 DIAGNOSIS — K219 Gastro-esophageal reflux disease without esophagitis: Secondary | ICD-10-CM | POA: Diagnosis not present

## 2022-05-02 DIAGNOSIS — D508 Other iron deficiency anemias: Secondary | ICD-10-CM | POA: Diagnosis not present

## 2022-05-02 DIAGNOSIS — M6281 Muscle weakness (generalized): Secondary | ICD-10-CM | POA: Diagnosis not present

## 2022-05-03 DIAGNOSIS — E038 Other specified hypothyroidism: Secondary | ICD-10-CM | POA: Diagnosis not present

## 2022-05-03 DIAGNOSIS — N182 Chronic kidney disease, stage 2 (mild): Secondary | ICD-10-CM | POA: Diagnosis not present

## 2022-05-03 DIAGNOSIS — K2091 Esophagitis, unspecified with bleeding: Secondary | ICD-10-CM | POA: Diagnosis not present

## 2022-05-03 DIAGNOSIS — K219 Gastro-esophageal reflux disease without esophagitis: Secondary | ICD-10-CM | POA: Diagnosis not present

## 2022-05-03 DIAGNOSIS — R001 Bradycardia, unspecified: Secondary | ICD-10-CM | POA: Diagnosis not present

## 2022-05-03 DIAGNOSIS — E782 Mixed hyperlipidemia: Secondary | ICD-10-CM | POA: Diagnosis not present

## 2022-05-03 DIAGNOSIS — K922 Gastrointestinal hemorrhage, unspecified: Secondary | ICD-10-CM | POA: Diagnosis not present

## 2022-05-03 DIAGNOSIS — E1122 Type 2 diabetes mellitus with diabetic chronic kidney disease: Secondary | ICD-10-CM | POA: Diagnosis not present

## 2022-05-03 DIAGNOSIS — D500 Iron deficiency anemia secondary to blood loss (chronic): Secondary | ICD-10-CM | POA: Diagnosis not present

## 2022-05-03 DIAGNOSIS — D508 Other iron deficiency anemias: Secondary | ICD-10-CM | POA: Diagnosis not present

## 2022-05-03 DIAGNOSIS — M6281 Muscle weakness (generalized): Secondary | ICD-10-CM | POA: Diagnosis not present

## 2022-05-03 DIAGNOSIS — E02 Subclinical iodine-deficiency hypothyroidism: Secondary | ICD-10-CM | POA: Diagnosis not present

## 2022-05-05 DIAGNOSIS — M6281 Muscle weakness (generalized): Secondary | ICD-10-CM | POA: Diagnosis not present

## 2022-05-05 DIAGNOSIS — R001 Bradycardia, unspecified: Secondary | ICD-10-CM | POA: Diagnosis not present

## 2022-05-05 DIAGNOSIS — E1122 Type 2 diabetes mellitus with diabetic chronic kidney disease: Secondary | ICD-10-CM | POA: Diagnosis not present

## 2022-05-05 DIAGNOSIS — D508 Other iron deficiency anemias: Secondary | ICD-10-CM | POA: Diagnosis not present

## 2022-05-05 DIAGNOSIS — N182 Chronic kidney disease, stage 2 (mild): Secondary | ICD-10-CM | POA: Diagnosis not present

## 2022-05-05 DIAGNOSIS — K219 Gastro-esophageal reflux disease without esophagitis: Secondary | ICD-10-CM | POA: Diagnosis not present

## 2022-05-05 DIAGNOSIS — K922 Gastrointestinal hemorrhage, unspecified: Secondary | ICD-10-CM | POA: Diagnosis not present

## 2022-05-05 DIAGNOSIS — K2091 Esophagitis, unspecified with bleeding: Secondary | ICD-10-CM | POA: Diagnosis not present

## 2022-05-05 DIAGNOSIS — E02 Subclinical iodine-deficiency hypothyroidism: Secondary | ICD-10-CM | POA: Diagnosis not present

## 2022-05-10 DIAGNOSIS — N182 Chronic kidney disease, stage 2 (mild): Secondary | ICD-10-CM | POA: Diagnosis not present

## 2022-05-10 DIAGNOSIS — K922 Gastrointestinal hemorrhage, unspecified: Secondary | ICD-10-CM | POA: Diagnosis not present

## 2022-05-10 DIAGNOSIS — D508 Other iron deficiency anemias: Secondary | ICD-10-CM | POA: Diagnosis not present

## 2022-05-10 DIAGNOSIS — E1122 Type 2 diabetes mellitus with diabetic chronic kidney disease: Secondary | ICD-10-CM | POA: Diagnosis not present

## 2022-05-10 DIAGNOSIS — E02 Subclinical iodine-deficiency hypothyroidism: Secondary | ICD-10-CM | POA: Diagnosis not present

## 2022-05-10 DIAGNOSIS — M6281 Muscle weakness (generalized): Secondary | ICD-10-CM | POA: Diagnosis not present

## 2022-05-10 DIAGNOSIS — R001 Bradycardia, unspecified: Secondary | ICD-10-CM | POA: Diagnosis not present

## 2022-05-10 DIAGNOSIS — K219 Gastro-esophageal reflux disease without esophagitis: Secondary | ICD-10-CM | POA: Diagnosis not present

## 2022-05-10 DIAGNOSIS — K2091 Esophagitis, unspecified with bleeding: Secondary | ICD-10-CM | POA: Diagnosis not present

## 2022-05-12 DIAGNOSIS — Z79899 Other long term (current) drug therapy: Secondary | ICD-10-CM | POA: Diagnosis not present

## 2022-05-12 DIAGNOSIS — R2689 Other abnormalities of gait and mobility: Secondary | ICD-10-CM | POA: Diagnosis not present

## 2022-05-12 DIAGNOSIS — R278 Other lack of coordination: Secondary | ICD-10-CM | POA: Diagnosis not present

## 2022-05-12 DIAGNOSIS — M6281 Muscle weakness (generalized): Secondary | ICD-10-CM | POA: Diagnosis not present

## 2022-05-12 DIAGNOSIS — R41841 Cognitive communication deficit: Secondary | ICD-10-CM | POA: Diagnosis not present

## 2022-05-12 DIAGNOSIS — G9340 Encephalopathy, unspecified: Secondary | ICD-10-CM | POA: Diagnosis not present

## 2022-05-12 DIAGNOSIS — R1312 Dysphagia, oropharyngeal phase: Secondary | ICD-10-CM | POA: Diagnosis not present

## 2022-05-15 DIAGNOSIS — M6281 Muscle weakness (generalized): Secondary | ICD-10-CM | POA: Diagnosis not present

## 2022-05-15 DIAGNOSIS — R001 Bradycardia, unspecified: Secondary | ICD-10-CM | POA: Diagnosis not present

## 2022-05-15 DIAGNOSIS — N182 Chronic kidney disease, stage 2 (mild): Secondary | ICD-10-CM | POA: Diagnosis not present

## 2022-05-15 DIAGNOSIS — R278 Other lack of coordination: Secondary | ICD-10-CM | POA: Diagnosis not present

## 2022-05-15 DIAGNOSIS — K922 Gastrointestinal hemorrhage, unspecified: Secondary | ICD-10-CM | POA: Diagnosis not present

## 2022-05-15 DIAGNOSIS — R2689 Other abnormalities of gait and mobility: Secondary | ICD-10-CM | POA: Diagnosis not present

## 2022-05-15 DIAGNOSIS — R41841 Cognitive communication deficit: Secondary | ICD-10-CM | POA: Diagnosis not present

## 2022-05-15 DIAGNOSIS — R1312 Dysphagia, oropharyngeal phase: Secondary | ICD-10-CM | POA: Diagnosis not present

## 2022-05-15 DIAGNOSIS — K2091 Esophagitis, unspecified with bleeding: Secondary | ICD-10-CM | POA: Diagnosis not present

## 2022-05-15 DIAGNOSIS — E1122 Type 2 diabetes mellitus with diabetic chronic kidney disease: Secondary | ICD-10-CM | POA: Diagnosis not present

## 2022-05-15 DIAGNOSIS — D508 Other iron deficiency anemias: Secondary | ICD-10-CM | POA: Diagnosis not present

## 2022-05-15 DIAGNOSIS — K219 Gastro-esophageal reflux disease without esophagitis: Secondary | ICD-10-CM | POA: Diagnosis not present

## 2022-05-15 DIAGNOSIS — G9340 Encephalopathy, unspecified: Secondary | ICD-10-CM | POA: Diagnosis not present

## 2022-05-15 DIAGNOSIS — E038 Other specified hypothyroidism: Secondary | ICD-10-CM | POA: Diagnosis not present

## 2022-05-16 DIAGNOSIS — R41841 Cognitive communication deficit: Secondary | ICD-10-CM | POA: Diagnosis not present

## 2022-05-16 DIAGNOSIS — M6281 Muscle weakness (generalized): Secondary | ICD-10-CM | POA: Diagnosis not present

## 2022-05-16 DIAGNOSIS — R2689 Other abnormalities of gait and mobility: Secondary | ICD-10-CM | POA: Diagnosis not present

## 2022-05-16 DIAGNOSIS — R1312 Dysphagia, oropharyngeal phase: Secondary | ICD-10-CM | POA: Diagnosis not present

## 2022-05-16 DIAGNOSIS — G9340 Encephalopathy, unspecified: Secondary | ICD-10-CM | POA: Diagnosis not present

## 2022-05-16 DIAGNOSIS — R278 Other lack of coordination: Secondary | ICD-10-CM | POA: Diagnosis not present

## 2022-05-17 DIAGNOSIS — G9340 Encephalopathy, unspecified: Secondary | ICD-10-CM | POA: Diagnosis not present

## 2022-05-17 DIAGNOSIS — R278 Other lack of coordination: Secondary | ICD-10-CM | POA: Diagnosis not present

## 2022-05-17 DIAGNOSIS — R2689 Other abnormalities of gait and mobility: Secondary | ICD-10-CM | POA: Diagnosis not present

## 2022-05-17 DIAGNOSIS — R41841 Cognitive communication deficit: Secondary | ICD-10-CM | POA: Diagnosis not present

## 2022-05-17 DIAGNOSIS — R1312 Dysphagia, oropharyngeal phase: Secondary | ICD-10-CM | POA: Diagnosis not present

## 2022-05-17 DIAGNOSIS — M6281 Muscle weakness (generalized): Secondary | ICD-10-CM | POA: Diagnosis not present

## 2022-05-18 DIAGNOSIS — M6281 Muscle weakness (generalized): Secondary | ICD-10-CM | POA: Diagnosis not present

## 2022-05-18 DIAGNOSIS — R41841 Cognitive communication deficit: Secondary | ICD-10-CM | POA: Diagnosis not present

## 2022-05-18 DIAGNOSIS — R1312 Dysphagia, oropharyngeal phase: Secondary | ICD-10-CM | POA: Diagnosis not present

## 2022-05-18 DIAGNOSIS — R278 Other lack of coordination: Secondary | ICD-10-CM | POA: Diagnosis not present

## 2022-05-18 DIAGNOSIS — R2689 Other abnormalities of gait and mobility: Secondary | ICD-10-CM | POA: Diagnosis not present

## 2022-05-18 DIAGNOSIS — G9340 Encephalopathy, unspecified: Secondary | ICD-10-CM | POA: Diagnosis not present

## 2022-05-19 DIAGNOSIS — M6281 Muscle weakness (generalized): Secondary | ICD-10-CM | POA: Diagnosis not present

## 2022-05-19 DIAGNOSIS — R41841 Cognitive communication deficit: Secondary | ICD-10-CM | POA: Diagnosis not present

## 2022-05-19 DIAGNOSIS — G9340 Encephalopathy, unspecified: Secondary | ICD-10-CM | POA: Diagnosis not present

## 2022-05-19 DIAGNOSIS — R1312 Dysphagia, oropharyngeal phase: Secondary | ICD-10-CM | POA: Diagnosis not present

## 2022-05-19 DIAGNOSIS — R2689 Other abnormalities of gait and mobility: Secondary | ICD-10-CM | POA: Diagnosis not present

## 2022-05-19 DIAGNOSIS — R278 Other lack of coordination: Secondary | ICD-10-CM | POA: Diagnosis not present

## 2022-05-21 DIAGNOSIS — R2689 Other abnormalities of gait and mobility: Secondary | ICD-10-CM | POA: Diagnosis not present

## 2022-05-21 DIAGNOSIS — R1312 Dysphagia, oropharyngeal phase: Secondary | ICD-10-CM | POA: Diagnosis not present

## 2022-05-21 DIAGNOSIS — R278 Other lack of coordination: Secondary | ICD-10-CM | POA: Diagnosis not present

## 2022-05-21 DIAGNOSIS — G9340 Encephalopathy, unspecified: Secondary | ICD-10-CM | POA: Diagnosis not present

## 2022-05-21 DIAGNOSIS — R41841 Cognitive communication deficit: Secondary | ICD-10-CM | POA: Diagnosis not present

## 2022-05-21 DIAGNOSIS — M6281 Muscle weakness (generalized): Secondary | ICD-10-CM | POA: Diagnosis not present

## 2022-05-22 DIAGNOSIS — R2689 Other abnormalities of gait and mobility: Secondary | ICD-10-CM | POA: Diagnosis not present

## 2022-05-22 DIAGNOSIS — G9340 Encephalopathy, unspecified: Secondary | ICD-10-CM | POA: Diagnosis not present

## 2022-05-22 DIAGNOSIS — R1312 Dysphagia, oropharyngeal phase: Secondary | ICD-10-CM | POA: Diagnosis not present

## 2022-05-22 DIAGNOSIS — M6281 Muscle weakness (generalized): Secondary | ICD-10-CM | POA: Diagnosis not present

## 2022-05-22 DIAGNOSIS — R41841 Cognitive communication deficit: Secondary | ICD-10-CM | POA: Diagnosis not present

## 2022-05-22 DIAGNOSIS — R278 Other lack of coordination: Secondary | ICD-10-CM | POA: Diagnosis not present

## 2022-05-23 DIAGNOSIS — R2689 Other abnormalities of gait and mobility: Secondary | ICD-10-CM | POA: Diagnosis not present

## 2022-05-23 DIAGNOSIS — R41841 Cognitive communication deficit: Secondary | ICD-10-CM | POA: Diagnosis not present

## 2022-05-23 DIAGNOSIS — R278 Other lack of coordination: Secondary | ICD-10-CM | POA: Diagnosis not present

## 2022-05-23 DIAGNOSIS — R1312 Dysphagia, oropharyngeal phase: Secondary | ICD-10-CM | POA: Diagnosis not present

## 2022-05-23 DIAGNOSIS — G9340 Encephalopathy, unspecified: Secondary | ICD-10-CM | POA: Diagnosis not present

## 2022-05-23 DIAGNOSIS — M6281 Muscle weakness (generalized): Secondary | ICD-10-CM | POA: Diagnosis not present

## 2022-05-24 DIAGNOSIS — M6281 Muscle weakness (generalized): Secondary | ICD-10-CM | POA: Diagnosis not present

## 2022-05-24 DIAGNOSIS — R1312 Dysphagia, oropharyngeal phase: Secondary | ICD-10-CM | POA: Diagnosis not present

## 2022-05-24 DIAGNOSIS — R278 Other lack of coordination: Secondary | ICD-10-CM | POA: Diagnosis not present

## 2022-05-24 DIAGNOSIS — R41841 Cognitive communication deficit: Secondary | ICD-10-CM | POA: Diagnosis not present

## 2022-05-24 DIAGNOSIS — G9340 Encephalopathy, unspecified: Secondary | ICD-10-CM | POA: Diagnosis not present

## 2022-05-24 DIAGNOSIS — R2689 Other abnormalities of gait and mobility: Secondary | ICD-10-CM | POA: Diagnosis not present

## 2022-05-25 DIAGNOSIS — M6281 Muscle weakness (generalized): Secondary | ICD-10-CM | POA: Diagnosis not present

## 2022-05-25 DIAGNOSIS — R41841 Cognitive communication deficit: Secondary | ICD-10-CM | POA: Diagnosis not present

## 2022-05-25 DIAGNOSIS — R2689 Other abnormalities of gait and mobility: Secondary | ICD-10-CM | POA: Diagnosis not present

## 2022-05-25 DIAGNOSIS — R278 Other lack of coordination: Secondary | ICD-10-CM | POA: Diagnosis not present

## 2022-05-25 DIAGNOSIS — R1312 Dysphagia, oropharyngeal phase: Secondary | ICD-10-CM | POA: Diagnosis not present

## 2022-05-25 DIAGNOSIS — G9340 Encephalopathy, unspecified: Secondary | ICD-10-CM | POA: Diagnosis not present

## 2022-05-26 DIAGNOSIS — M6281 Muscle weakness (generalized): Secondary | ICD-10-CM | POA: Diagnosis not present

## 2022-05-26 DIAGNOSIS — G9340 Encephalopathy, unspecified: Secondary | ICD-10-CM | POA: Diagnosis not present

## 2022-05-26 DIAGNOSIS — R2689 Other abnormalities of gait and mobility: Secondary | ICD-10-CM | POA: Diagnosis not present

## 2022-05-26 DIAGNOSIS — R41841 Cognitive communication deficit: Secondary | ICD-10-CM | POA: Diagnosis not present

## 2022-05-26 DIAGNOSIS — R278 Other lack of coordination: Secondary | ICD-10-CM | POA: Diagnosis not present

## 2022-05-26 DIAGNOSIS — R1312 Dysphagia, oropharyngeal phase: Secondary | ICD-10-CM | POA: Diagnosis not present

## 2022-05-27 DIAGNOSIS — R1312 Dysphagia, oropharyngeal phase: Secondary | ICD-10-CM | POA: Diagnosis not present

## 2022-05-27 DIAGNOSIS — R2689 Other abnormalities of gait and mobility: Secondary | ICD-10-CM | POA: Diagnosis not present

## 2022-05-27 DIAGNOSIS — G9340 Encephalopathy, unspecified: Secondary | ICD-10-CM | POA: Diagnosis not present

## 2022-05-27 DIAGNOSIS — R278 Other lack of coordination: Secondary | ICD-10-CM | POA: Diagnosis not present

## 2022-05-27 DIAGNOSIS — M6281 Muscle weakness (generalized): Secondary | ICD-10-CM | POA: Diagnosis not present

## 2022-05-27 DIAGNOSIS — R41841 Cognitive communication deficit: Secondary | ICD-10-CM | POA: Diagnosis not present

## 2022-05-28 DIAGNOSIS — G9340 Encephalopathy, unspecified: Secondary | ICD-10-CM | POA: Diagnosis not present

## 2022-05-28 DIAGNOSIS — R1312 Dysphagia, oropharyngeal phase: Secondary | ICD-10-CM | POA: Diagnosis not present

## 2022-05-28 DIAGNOSIS — R2689 Other abnormalities of gait and mobility: Secondary | ICD-10-CM | POA: Diagnosis not present

## 2022-05-28 DIAGNOSIS — M6281 Muscle weakness (generalized): Secondary | ICD-10-CM | POA: Diagnosis not present

## 2022-05-28 DIAGNOSIS — R41841 Cognitive communication deficit: Secondary | ICD-10-CM | POA: Diagnosis not present

## 2022-05-28 DIAGNOSIS — R278 Other lack of coordination: Secondary | ICD-10-CM | POA: Diagnosis not present

## 2022-05-29 DIAGNOSIS — R2689 Other abnormalities of gait and mobility: Secondary | ICD-10-CM | POA: Diagnosis not present

## 2022-05-29 DIAGNOSIS — G9340 Encephalopathy, unspecified: Secondary | ICD-10-CM | POA: Diagnosis not present

## 2022-05-29 DIAGNOSIS — M6281 Muscle weakness (generalized): Secondary | ICD-10-CM | POA: Diagnosis not present

## 2022-05-29 DIAGNOSIS — R41841 Cognitive communication deficit: Secondary | ICD-10-CM | POA: Diagnosis not present

## 2022-05-29 DIAGNOSIS — R1312 Dysphagia, oropharyngeal phase: Secondary | ICD-10-CM | POA: Diagnosis not present

## 2022-05-29 DIAGNOSIS — R278 Other lack of coordination: Secondary | ICD-10-CM | POA: Diagnosis not present

## 2022-05-30 DIAGNOSIS — R1312 Dysphagia, oropharyngeal phase: Secondary | ICD-10-CM | POA: Diagnosis not present

## 2022-05-30 DIAGNOSIS — R2689 Other abnormalities of gait and mobility: Secondary | ICD-10-CM | POA: Diagnosis not present

## 2022-05-30 DIAGNOSIS — R278 Other lack of coordination: Secondary | ICD-10-CM | POA: Diagnosis not present

## 2022-05-30 DIAGNOSIS — M6281 Muscle weakness (generalized): Secondary | ICD-10-CM | POA: Diagnosis not present

## 2022-05-30 DIAGNOSIS — G9340 Encephalopathy, unspecified: Secondary | ICD-10-CM | POA: Diagnosis not present

## 2022-05-30 DIAGNOSIS — R41841 Cognitive communication deficit: Secondary | ICD-10-CM | POA: Diagnosis not present

## 2022-05-31 DIAGNOSIS — G9340 Encephalopathy, unspecified: Secondary | ICD-10-CM | POA: Diagnosis not present

## 2022-05-31 DIAGNOSIS — R41841 Cognitive communication deficit: Secondary | ICD-10-CM | POA: Diagnosis not present

## 2022-05-31 DIAGNOSIS — R1312 Dysphagia, oropharyngeal phase: Secondary | ICD-10-CM | POA: Diagnosis not present

## 2022-05-31 DIAGNOSIS — M6281 Muscle weakness (generalized): Secondary | ICD-10-CM | POA: Diagnosis not present

## 2022-05-31 DIAGNOSIS — R2689 Other abnormalities of gait and mobility: Secondary | ICD-10-CM | POA: Diagnosis not present

## 2022-05-31 DIAGNOSIS — R278 Other lack of coordination: Secondary | ICD-10-CM | POA: Diagnosis not present

## 2022-06-01 DIAGNOSIS — E782 Mixed hyperlipidemia: Secondary | ICD-10-CM | POA: Diagnosis not present

## 2022-06-01 DIAGNOSIS — E039 Hypothyroidism, unspecified: Secondary | ICD-10-CM | POA: Diagnosis not present

## 2022-06-01 DIAGNOSIS — R1312 Dysphagia, oropharyngeal phase: Secondary | ICD-10-CM | POA: Diagnosis not present

## 2022-06-01 DIAGNOSIS — R2689 Other abnormalities of gait and mobility: Secondary | ICD-10-CM | POA: Diagnosis not present

## 2022-06-01 DIAGNOSIS — R41841 Cognitive communication deficit: Secondary | ICD-10-CM | POA: Diagnosis not present

## 2022-06-01 DIAGNOSIS — G9340 Encephalopathy, unspecified: Secondary | ICD-10-CM | POA: Diagnosis not present

## 2022-06-01 DIAGNOSIS — M6281 Muscle weakness (generalized): Secondary | ICD-10-CM | POA: Diagnosis not present

## 2022-06-01 DIAGNOSIS — R278 Other lack of coordination: Secondary | ICD-10-CM | POA: Diagnosis not present

## 2022-06-02 DIAGNOSIS — R2689 Other abnormalities of gait and mobility: Secondary | ICD-10-CM | POA: Diagnosis not present

## 2022-06-02 DIAGNOSIS — M6281 Muscle weakness (generalized): Secondary | ICD-10-CM | POA: Diagnosis not present

## 2022-06-02 DIAGNOSIS — G9340 Encephalopathy, unspecified: Secondary | ICD-10-CM | POA: Diagnosis not present

## 2022-06-02 DIAGNOSIS — K922 Gastrointestinal hemorrhage, unspecified: Secondary | ICD-10-CM | POA: Diagnosis not present

## 2022-06-02 DIAGNOSIS — R001 Bradycardia, unspecified: Secondary | ICD-10-CM | POA: Diagnosis not present

## 2022-06-02 DIAGNOSIS — R278 Other lack of coordination: Secondary | ICD-10-CM | POA: Diagnosis not present

## 2022-06-02 DIAGNOSIS — R1312 Dysphagia, oropharyngeal phase: Secondary | ICD-10-CM | POA: Diagnosis not present

## 2022-06-02 DIAGNOSIS — E038 Other specified hypothyroidism: Secondary | ICD-10-CM | POA: Diagnosis not present

## 2022-06-02 DIAGNOSIS — D508 Other iron deficiency anemias: Secondary | ICD-10-CM | POA: Diagnosis not present

## 2022-06-02 DIAGNOSIS — R41841 Cognitive communication deficit: Secondary | ICD-10-CM | POA: Diagnosis not present

## 2022-06-02 DIAGNOSIS — K219 Gastro-esophageal reflux disease without esophagitis: Secondary | ICD-10-CM | POA: Diagnosis not present

## 2022-06-02 DIAGNOSIS — N182 Chronic kidney disease, stage 2 (mild): Secondary | ICD-10-CM | POA: Diagnosis not present

## 2022-06-02 DIAGNOSIS — K2091 Esophagitis, unspecified with bleeding: Secondary | ICD-10-CM | POA: Diagnosis not present

## 2022-06-02 DIAGNOSIS — E1122 Type 2 diabetes mellitus with diabetic chronic kidney disease: Secondary | ICD-10-CM | POA: Diagnosis not present

## 2022-06-04 DIAGNOSIS — M6281 Muscle weakness (generalized): Secondary | ICD-10-CM | POA: Diagnosis not present

## 2022-06-04 DIAGNOSIS — R278 Other lack of coordination: Secondary | ICD-10-CM | POA: Diagnosis not present

## 2022-06-04 DIAGNOSIS — R2689 Other abnormalities of gait and mobility: Secondary | ICD-10-CM | POA: Diagnosis not present

## 2022-06-04 DIAGNOSIS — R41841 Cognitive communication deficit: Secondary | ICD-10-CM | POA: Diagnosis not present

## 2022-06-04 DIAGNOSIS — R1312 Dysphagia, oropharyngeal phase: Secondary | ICD-10-CM | POA: Diagnosis not present

## 2022-06-04 DIAGNOSIS — G9340 Encephalopathy, unspecified: Secondary | ICD-10-CM | POA: Diagnosis not present

## 2022-06-05 DIAGNOSIS — G9340 Encephalopathy, unspecified: Secondary | ICD-10-CM | POA: Diagnosis not present

## 2022-06-05 DIAGNOSIS — R2689 Other abnormalities of gait and mobility: Secondary | ICD-10-CM | POA: Diagnosis not present

## 2022-06-05 DIAGNOSIS — R278 Other lack of coordination: Secondary | ICD-10-CM | POA: Diagnosis not present

## 2022-06-05 DIAGNOSIS — R1312 Dysphagia, oropharyngeal phase: Secondary | ICD-10-CM | POA: Diagnosis not present

## 2022-06-05 DIAGNOSIS — M6281 Muscle weakness (generalized): Secondary | ICD-10-CM | POA: Diagnosis not present

## 2022-06-05 DIAGNOSIS — R41841 Cognitive communication deficit: Secondary | ICD-10-CM | POA: Diagnosis not present

## 2022-06-06 DIAGNOSIS — R1312 Dysphagia, oropharyngeal phase: Secondary | ICD-10-CM | POA: Diagnosis not present

## 2022-06-06 DIAGNOSIS — R2689 Other abnormalities of gait and mobility: Secondary | ICD-10-CM | POA: Diagnosis not present

## 2022-06-06 DIAGNOSIS — G9340 Encephalopathy, unspecified: Secondary | ICD-10-CM | POA: Diagnosis not present

## 2022-06-06 DIAGNOSIS — R41841 Cognitive communication deficit: Secondary | ICD-10-CM | POA: Diagnosis not present

## 2022-06-06 DIAGNOSIS — M6281 Muscle weakness (generalized): Secondary | ICD-10-CM | POA: Diagnosis not present

## 2022-06-06 DIAGNOSIS — R278 Other lack of coordination: Secondary | ICD-10-CM | POA: Diagnosis not present

## 2022-06-07 DIAGNOSIS — R2689 Other abnormalities of gait and mobility: Secondary | ICD-10-CM | POA: Diagnosis not present

## 2022-06-07 DIAGNOSIS — R278 Other lack of coordination: Secondary | ICD-10-CM | POA: Diagnosis not present

## 2022-06-07 DIAGNOSIS — M6281 Muscle weakness (generalized): Secondary | ICD-10-CM | POA: Diagnosis not present

## 2022-06-07 DIAGNOSIS — G9340 Encephalopathy, unspecified: Secondary | ICD-10-CM | POA: Diagnosis not present

## 2022-06-07 DIAGNOSIS — R1312 Dysphagia, oropharyngeal phase: Secondary | ICD-10-CM | POA: Diagnosis not present

## 2022-06-07 DIAGNOSIS — R41841 Cognitive communication deficit: Secondary | ICD-10-CM | POA: Diagnosis not present

## 2022-06-08 DIAGNOSIS — R278 Other lack of coordination: Secondary | ICD-10-CM | POA: Diagnosis not present

## 2022-06-08 DIAGNOSIS — G9340 Encephalopathy, unspecified: Secondary | ICD-10-CM | POA: Diagnosis not present

## 2022-06-08 DIAGNOSIS — M6281 Muscle weakness (generalized): Secondary | ICD-10-CM | POA: Diagnosis not present

## 2022-06-08 DIAGNOSIS — R2689 Other abnormalities of gait and mobility: Secondary | ICD-10-CM | POA: Diagnosis not present

## 2022-06-09 DIAGNOSIS — R2689 Other abnormalities of gait and mobility: Secondary | ICD-10-CM | POA: Diagnosis not present

## 2022-06-09 DIAGNOSIS — R278 Other lack of coordination: Secondary | ICD-10-CM | POA: Diagnosis not present

## 2022-06-09 DIAGNOSIS — G9340 Encephalopathy, unspecified: Secondary | ICD-10-CM | POA: Diagnosis not present

## 2022-06-09 DIAGNOSIS — M6281 Muscle weakness (generalized): Secondary | ICD-10-CM | POA: Diagnosis not present

## 2022-06-11 DIAGNOSIS — G9340 Encephalopathy, unspecified: Secondary | ICD-10-CM | POA: Diagnosis not present

## 2022-06-11 DIAGNOSIS — M6281 Muscle weakness (generalized): Secondary | ICD-10-CM | POA: Diagnosis not present

## 2022-06-11 DIAGNOSIS — R2689 Other abnormalities of gait and mobility: Secondary | ICD-10-CM | POA: Diagnosis not present

## 2022-06-11 DIAGNOSIS — R278 Other lack of coordination: Secondary | ICD-10-CM | POA: Diagnosis not present

## 2022-06-12 DIAGNOSIS — G9340 Encephalopathy, unspecified: Secondary | ICD-10-CM | POA: Diagnosis not present

## 2022-06-12 DIAGNOSIS — M6281 Muscle weakness (generalized): Secondary | ICD-10-CM | POA: Diagnosis not present

## 2022-06-12 DIAGNOSIS — R2689 Other abnormalities of gait and mobility: Secondary | ICD-10-CM | POA: Diagnosis not present

## 2022-06-12 DIAGNOSIS — R278 Other lack of coordination: Secondary | ICD-10-CM | POA: Diagnosis not present

## 2022-06-13 DIAGNOSIS — R278 Other lack of coordination: Secondary | ICD-10-CM | POA: Diagnosis not present

## 2022-06-13 DIAGNOSIS — G9340 Encephalopathy, unspecified: Secondary | ICD-10-CM | POA: Diagnosis not present

## 2022-06-13 DIAGNOSIS — R2689 Other abnormalities of gait and mobility: Secondary | ICD-10-CM | POA: Diagnosis not present

## 2022-06-13 DIAGNOSIS — M6281 Muscle weakness (generalized): Secondary | ICD-10-CM | POA: Diagnosis not present

## 2022-06-14 DIAGNOSIS — R2689 Other abnormalities of gait and mobility: Secondary | ICD-10-CM | POA: Diagnosis not present

## 2022-06-14 DIAGNOSIS — M6281 Muscle weakness (generalized): Secondary | ICD-10-CM | POA: Diagnosis not present

## 2022-06-14 DIAGNOSIS — R278 Other lack of coordination: Secondary | ICD-10-CM | POA: Diagnosis not present

## 2022-06-14 DIAGNOSIS — G9340 Encephalopathy, unspecified: Secondary | ICD-10-CM | POA: Diagnosis not present

## 2022-06-20 DIAGNOSIS — M6281 Muscle weakness (generalized): Secondary | ICD-10-CM | POA: Diagnosis not present

## 2022-06-20 DIAGNOSIS — K2101 Gastro-esophageal reflux disease with esophagitis, with bleeding: Secondary | ICD-10-CM | POA: Diagnosis not present

## 2022-06-20 DIAGNOSIS — K922 Gastrointestinal hemorrhage, unspecified: Secondary | ICD-10-CM | POA: Diagnosis not present

## 2022-06-20 DIAGNOSIS — K2091 Esophagitis, unspecified with bleeding: Secondary | ICD-10-CM | POA: Diagnosis not present

## 2022-06-20 DIAGNOSIS — D508 Other iron deficiency anemias: Secondary | ICD-10-CM | POA: Diagnosis not present

## 2022-06-26 DIAGNOSIS — L84 Corns and callosities: Secondary | ICD-10-CM | POA: Diagnosis not present

## 2022-06-26 DIAGNOSIS — E1151 Type 2 diabetes mellitus with diabetic peripheral angiopathy without gangrene: Secondary | ICD-10-CM | POA: Diagnosis not present

## 2022-06-26 DIAGNOSIS — B351 Tinea unguium: Secondary | ICD-10-CM | POA: Diagnosis not present

## 2022-06-26 DIAGNOSIS — I739 Peripheral vascular disease, unspecified: Secondary | ICD-10-CM | POA: Diagnosis not present

## 2022-06-26 DIAGNOSIS — Z7984 Long term (current) use of oral hypoglycemic drugs: Secondary | ICD-10-CM | POA: Diagnosis not present

## 2022-06-29 DIAGNOSIS — E782 Mixed hyperlipidemia: Secondary | ICD-10-CM | POA: Diagnosis not present

## 2022-06-29 DIAGNOSIS — E039 Hypothyroidism, unspecified: Secondary | ICD-10-CM | POA: Diagnosis not present

## 2022-06-30 DIAGNOSIS — R001 Bradycardia, unspecified: Secondary | ICD-10-CM | POA: Diagnosis not present

## 2022-06-30 DIAGNOSIS — M6281 Muscle weakness (generalized): Secondary | ICD-10-CM | POA: Diagnosis not present

## 2022-06-30 DIAGNOSIS — K922 Gastrointestinal hemorrhage, unspecified: Secondary | ICD-10-CM | POA: Diagnosis not present

## 2022-06-30 DIAGNOSIS — N182 Chronic kidney disease, stage 2 (mild): Secondary | ICD-10-CM | POA: Diagnosis not present

## 2022-06-30 DIAGNOSIS — K219 Gastro-esophageal reflux disease without esophagitis: Secondary | ICD-10-CM | POA: Diagnosis not present

## 2022-06-30 DIAGNOSIS — D508 Other iron deficiency anemias: Secondary | ICD-10-CM | POA: Diagnosis not present

## 2022-06-30 DIAGNOSIS — K2091 Esophagitis, unspecified with bleeding: Secondary | ICD-10-CM | POA: Diagnosis not present

## 2022-06-30 DIAGNOSIS — E1122 Type 2 diabetes mellitus with diabetic chronic kidney disease: Secondary | ICD-10-CM | POA: Diagnosis not present

## 2022-06-30 DIAGNOSIS — E038 Other specified hypothyroidism: Secondary | ICD-10-CM | POA: Diagnosis not present

## 2022-07-23 DIAGNOSIS — R2689 Other abnormalities of gait and mobility: Secondary | ICD-10-CM | POA: Diagnosis not present

## 2022-07-23 DIAGNOSIS — R278 Other lack of coordination: Secondary | ICD-10-CM | POA: Diagnosis not present

## 2022-07-23 DIAGNOSIS — R41841 Cognitive communication deficit: Secondary | ICD-10-CM | POA: Diagnosis not present

## 2022-07-23 DIAGNOSIS — G9340 Encephalopathy, unspecified: Secondary | ICD-10-CM | POA: Diagnosis not present

## 2022-07-23 DIAGNOSIS — M6281 Muscle weakness (generalized): Secondary | ICD-10-CM | POA: Diagnosis not present

## 2022-07-23 DIAGNOSIS — R1312 Dysphagia, oropharyngeal phase: Secondary | ICD-10-CM | POA: Diagnosis not present

## 2022-07-24 DIAGNOSIS — R0989 Other specified symptoms and signs involving the circulatory and respiratory systems: Secondary | ICD-10-CM | POA: Diagnosis not present

## 2022-07-24 DIAGNOSIS — R051 Acute cough: Secondary | ICD-10-CM | POA: Diagnosis not present

## 2022-07-24 DIAGNOSIS — R059 Cough, unspecified: Secondary | ICD-10-CM | POA: Diagnosis not present

## 2022-07-25 DIAGNOSIS — R1312 Dysphagia, oropharyngeal phase: Secondary | ICD-10-CM | POA: Diagnosis not present

## 2022-07-25 DIAGNOSIS — R2689 Other abnormalities of gait and mobility: Secondary | ICD-10-CM | POA: Diagnosis not present

## 2022-07-25 DIAGNOSIS — R41841 Cognitive communication deficit: Secondary | ICD-10-CM | POA: Diagnosis not present

## 2022-07-25 DIAGNOSIS — R278 Other lack of coordination: Secondary | ICD-10-CM | POA: Diagnosis not present

## 2022-07-25 DIAGNOSIS — G9340 Encephalopathy, unspecified: Secondary | ICD-10-CM | POA: Diagnosis not present

## 2022-07-25 DIAGNOSIS — M6281 Muscle weakness (generalized): Secondary | ICD-10-CM | POA: Diagnosis not present

## 2022-07-26 DIAGNOSIS — R1312 Dysphagia, oropharyngeal phase: Secondary | ICD-10-CM | POA: Diagnosis not present

## 2022-07-26 DIAGNOSIS — R41841 Cognitive communication deficit: Secondary | ICD-10-CM | POA: Diagnosis not present

## 2022-07-26 DIAGNOSIS — E782 Mixed hyperlipidemia: Secondary | ICD-10-CM | POA: Diagnosis not present

## 2022-07-26 DIAGNOSIS — E039 Hypothyroidism, unspecified: Secondary | ICD-10-CM | POA: Diagnosis not present

## 2022-07-26 DIAGNOSIS — G9340 Encephalopathy, unspecified: Secondary | ICD-10-CM | POA: Diagnosis not present

## 2022-07-26 DIAGNOSIS — R2689 Other abnormalities of gait and mobility: Secondary | ICD-10-CM | POA: Diagnosis not present

## 2022-07-26 DIAGNOSIS — R278 Other lack of coordination: Secondary | ICD-10-CM | POA: Diagnosis not present

## 2022-07-26 DIAGNOSIS — M6281 Muscle weakness (generalized): Secondary | ICD-10-CM | POA: Diagnosis not present

## 2022-07-27 DIAGNOSIS — M6281 Muscle weakness (generalized): Secondary | ICD-10-CM | POA: Diagnosis not present

## 2022-07-27 DIAGNOSIS — K922 Gastrointestinal hemorrhage, unspecified: Secondary | ICD-10-CM | POA: Diagnosis not present

## 2022-07-27 DIAGNOSIS — N182 Chronic kidney disease, stage 2 (mild): Secondary | ICD-10-CM | POA: Diagnosis not present

## 2022-07-27 DIAGNOSIS — E038 Other specified hypothyroidism: Secondary | ICD-10-CM | POA: Diagnosis not present

## 2022-07-27 DIAGNOSIS — D508 Other iron deficiency anemias: Secondary | ICD-10-CM | POA: Diagnosis not present

## 2022-07-27 DIAGNOSIS — R051 Acute cough: Secondary | ICD-10-CM | POA: Diagnosis not present

## 2022-07-27 DIAGNOSIS — R1312 Dysphagia, oropharyngeal phase: Secondary | ICD-10-CM | POA: Diagnosis not present

## 2022-07-27 DIAGNOSIS — R278 Other lack of coordination: Secondary | ICD-10-CM | POA: Diagnosis not present

## 2022-07-27 DIAGNOSIS — R001 Bradycardia, unspecified: Secondary | ICD-10-CM | POA: Diagnosis not present

## 2022-07-27 DIAGNOSIS — R41841 Cognitive communication deficit: Secondary | ICD-10-CM | POA: Diagnosis not present

## 2022-07-27 DIAGNOSIS — R2689 Other abnormalities of gait and mobility: Secondary | ICD-10-CM | POA: Diagnosis not present

## 2022-07-27 DIAGNOSIS — K219 Gastro-esophageal reflux disease without esophagitis: Secondary | ICD-10-CM | POA: Diagnosis not present

## 2022-07-27 DIAGNOSIS — E1122 Type 2 diabetes mellitus with diabetic chronic kidney disease: Secondary | ICD-10-CM | POA: Diagnosis not present

## 2022-07-27 DIAGNOSIS — G9340 Encephalopathy, unspecified: Secondary | ICD-10-CM | POA: Diagnosis not present

## 2022-07-27 DIAGNOSIS — K2091 Esophagitis, unspecified with bleeding: Secondary | ICD-10-CM | POA: Diagnosis not present

## 2022-07-28 DIAGNOSIS — R41841 Cognitive communication deficit: Secondary | ICD-10-CM | POA: Diagnosis not present

## 2022-07-28 DIAGNOSIS — G9340 Encephalopathy, unspecified: Secondary | ICD-10-CM | POA: Diagnosis not present

## 2022-07-28 DIAGNOSIS — M6281 Muscle weakness (generalized): Secondary | ICD-10-CM | POA: Diagnosis not present

## 2022-07-28 DIAGNOSIS — R1312 Dysphagia, oropharyngeal phase: Secondary | ICD-10-CM | POA: Diagnosis not present

## 2022-07-28 DIAGNOSIS — R2689 Other abnormalities of gait and mobility: Secondary | ICD-10-CM | POA: Diagnosis not present

## 2022-07-28 DIAGNOSIS — R278 Other lack of coordination: Secondary | ICD-10-CM | POA: Diagnosis not present

## 2022-07-31 DIAGNOSIS — R1312 Dysphagia, oropharyngeal phase: Secondary | ICD-10-CM | POA: Diagnosis not present

## 2022-07-31 DIAGNOSIS — E119 Type 2 diabetes mellitus without complications: Secondary | ICD-10-CM | POA: Diagnosis not present

## 2022-07-31 DIAGNOSIS — R41841 Cognitive communication deficit: Secondary | ICD-10-CM | POA: Diagnosis not present

## 2022-07-31 DIAGNOSIS — G9340 Encephalopathy, unspecified: Secondary | ICD-10-CM | POA: Diagnosis not present

## 2022-07-31 DIAGNOSIS — R278 Other lack of coordination: Secondary | ICD-10-CM | POA: Diagnosis not present

## 2022-07-31 DIAGNOSIS — R7989 Other specified abnormal findings of blood chemistry: Secondary | ICD-10-CM | POA: Diagnosis not present

## 2022-07-31 DIAGNOSIS — M6281 Muscle weakness (generalized): Secondary | ICD-10-CM | POA: Diagnosis not present

## 2022-07-31 DIAGNOSIS — R2689 Other abnormalities of gait and mobility: Secondary | ICD-10-CM | POA: Diagnosis not present

## 2022-08-01 DIAGNOSIS — R41841 Cognitive communication deficit: Secondary | ICD-10-CM | POA: Diagnosis not present

## 2022-08-01 DIAGNOSIS — R278 Other lack of coordination: Secondary | ICD-10-CM | POA: Diagnosis not present

## 2022-08-01 DIAGNOSIS — R0989 Other specified symptoms and signs involving the circulatory and respiratory systems: Secondary | ICD-10-CM | POA: Diagnosis not present

## 2022-08-01 DIAGNOSIS — M6281 Muscle weakness (generalized): Secondary | ICD-10-CM | POA: Diagnosis not present

## 2022-08-01 DIAGNOSIS — G9340 Encephalopathy, unspecified: Secondary | ICD-10-CM | POA: Diagnosis not present

## 2022-08-01 DIAGNOSIS — R1312 Dysphagia, oropharyngeal phase: Secondary | ICD-10-CM | POA: Diagnosis not present

## 2022-08-01 DIAGNOSIS — R059 Cough, unspecified: Secondary | ICD-10-CM | POA: Diagnosis not present

## 2022-08-01 DIAGNOSIS — R2689 Other abnormalities of gait and mobility: Secondary | ICD-10-CM | POA: Diagnosis not present

## 2022-08-02 DIAGNOSIS — G9340 Encephalopathy, unspecified: Secondary | ICD-10-CM | POA: Diagnosis not present

## 2022-08-02 DIAGNOSIS — R1312 Dysphagia, oropharyngeal phase: Secondary | ICD-10-CM | POA: Diagnosis not present

## 2022-08-02 DIAGNOSIS — R278 Other lack of coordination: Secondary | ICD-10-CM | POA: Diagnosis not present

## 2022-08-02 DIAGNOSIS — M6281 Muscle weakness (generalized): Secondary | ICD-10-CM | POA: Diagnosis not present

## 2022-08-02 DIAGNOSIS — R2689 Other abnormalities of gait and mobility: Secondary | ICD-10-CM | POA: Diagnosis not present

## 2022-08-02 DIAGNOSIS — R41841 Cognitive communication deficit: Secondary | ICD-10-CM | POA: Diagnosis not present

## 2022-08-03 DIAGNOSIS — R1312 Dysphagia, oropharyngeal phase: Secondary | ICD-10-CM | POA: Diagnosis not present

## 2022-08-03 DIAGNOSIS — G9340 Encephalopathy, unspecified: Secondary | ICD-10-CM | POA: Diagnosis not present

## 2022-08-03 DIAGNOSIS — R278 Other lack of coordination: Secondary | ICD-10-CM | POA: Diagnosis not present

## 2022-08-03 DIAGNOSIS — M6281 Muscle weakness (generalized): Secondary | ICD-10-CM | POA: Diagnosis not present

## 2022-08-03 DIAGNOSIS — R41841 Cognitive communication deficit: Secondary | ICD-10-CM | POA: Diagnosis not present

## 2022-08-03 DIAGNOSIS — R2689 Other abnormalities of gait and mobility: Secondary | ICD-10-CM | POA: Diagnosis not present

## 2022-08-05 DIAGNOSIS — R41841 Cognitive communication deficit: Secondary | ICD-10-CM | POA: Diagnosis not present

## 2022-08-05 DIAGNOSIS — M6281 Muscle weakness (generalized): Secondary | ICD-10-CM | POA: Diagnosis not present

## 2022-08-05 DIAGNOSIS — R1312 Dysphagia, oropharyngeal phase: Secondary | ICD-10-CM | POA: Diagnosis not present

## 2022-08-05 DIAGNOSIS — R278 Other lack of coordination: Secondary | ICD-10-CM | POA: Diagnosis not present

## 2022-08-05 DIAGNOSIS — R2689 Other abnormalities of gait and mobility: Secondary | ICD-10-CM | POA: Diagnosis not present

## 2022-08-05 DIAGNOSIS — G9340 Encephalopathy, unspecified: Secondary | ICD-10-CM | POA: Diagnosis not present

## 2022-08-06 DIAGNOSIS — R1312 Dysphagia, oropharyngeal phase: Secondary | ICD-10-CM | POA: Diagnosis not present

## 2022-08-06 DIAGNOSIS — R278 Other lack of coordination: Secondary | ICD-10-CM | POA: Diagnosis not present

## 2022-08-06 DIAGNOSIS — R41841 Cognitive communication deficit: Secondary | ICD-10-CM | POA: Diagnosis not present

## 2022-08-06 DIAGNOSIS — R2689 Other abnormalities of gait and mobility: Secondary | ICD-10-CM | POA: Diagnosis not present

## 2022-08-06 DIAGNOSIS — G9340 Encephalopathy, unspecified: Secondary | ICD-10-CM | POA: Diagnosis not present

## 2022-08-06 DIAGNOSIS — M6281 Muscle weakness (generalized): Secondary | ICD-10-CM | POA: Diagnosis not present

## 2022-08-07 DIAGNOSIS — M6281 Muscle weakness (generalized): Secondary | ICD-10-CM | POA: Diagnosis not present

## 2022-08-07 DIAGNOSIS — R41841 Cognitive communication deficit: Secondary | ICD-10-CM | POA: Diagnosis not present

## 2022-08-07 DIAGNOSIS — G9340 Encephalopathy, unspecified: Secondary | ICD-10-CM | POA: Diagnosis not present

## 2022-08-07 DIAGNOSIS — R1312 Dysphagia, oropharyngeal phase: Secondary | ICD-10-CM | POA: Diagnosis not present

## 2022-08-07 DIAGNOSIS — R278 Other lack of coordination: Secondary | ICD-10-CM | POA: Diagnosis not present

## 2022-08-07 DIAGNOSIS — R2689 Other abnormalities of gait and mobility: Secondary | ICD-10-CM | POA: Diagnosis not present

## 2022-08-08 DIAGNOSIS — M6281 Muscle weakness (generalized): Secondary | ICD-10-CM | POA: Diagnosis not present

## 2022-08-08 DIAGNOSIS — R41841 Cognitive communication deficit: Secondary | ICD-10-CM | POA: Diagnosis not present

## 2022-08-08 DIAGNOSIS — R1312 Dysphagia, oropharyngeal phase: Secondary | ICD-10-CM | POA: Diagnosis not present

## 2022-08-08 DIAGNOSIS — R2689 Other abnormalities of gait and mobility: Secondary | ICD-10-CM | POA: Diagnosis not present

## 2022-08-08 DIAGNOSIS — G9340 Encephalopathy, unspecified: Secondary | ICD-10-CM | POA: Diagnosis not present

## 2022-08-08 DIAGNOSIS — R278 Other lack of coordination: Secondary | ICD-10-CM | POA: Diagnosis not present

## 2022-08-09 DIAGNOSIS — R1312 Dysphagia, oropharyngeal phase: Secondary | ICD-10-CM | POA: Diagnosis not present

## 2022-08-09 DIAGNOSIS — M6281 Muscle weakness (generalized): Secondary | ICD-10-CM | POA: Diagnosis not present

## 2022-08-09 DIAGNOSIS — R41841 Cognitive communication deficit: Secondary | ICD-10-CM | POA: Diagnosis not present

## 2022-08-09 DIAGNOSIS — R2689 Other abnormalities of gait and mobility: Secondary | ICD-10-CM | POA: Diagnosis not present

## 2022-08-09 DIAGNOSIS — G9340 Encephalopathy, unspecified: Secondary | ICD-10-CM | POA: Diagnosis not present

## 2022-08-09 DIAGNOSIS — R278 Other lack of coordination: Secondary | ICD-10-CM | POA: Diagnosis not present

## 2022-08-10 DIAGNOSIS — G9340 Encephalopathy, unspecified: Secondary | ICD-10-CM | POA: Diagnosis not present

## 2022-08-10 DIAGNOSIS — R1312 Dysphagia, oropharyngeal phase: Secondary | ICD-10-CM | POA: Diagnosis not present

## 2022-08-10 DIAGNOSIS — R2689 Other abnormalities of gait and mobility: Secondary | ICD-10-CM | POA: Diagnosis not present

## 2022-08-10 DIAGNOSIS — R41841 Cognitive communication deficit: Secondary | ICD-10-CM | POA: Diagnosis not present

## 2022-08-10 DIAGNOSIS — R278 Other lack of coordination: Secondary | ICD-10-CM | POA: Diagnosis not present

## 2022-08-10 DIAGNOSIS — M6281 Muscle weakness (generalized): Secondary | ICD-10-CM | POA: Diagnosis not present

## 2022-08-14 DIAGNOSIS — R2689 Other abnormalities of gait and mobility: Secondary | ICD-10-CM | POA: Diagnosis not present

## 2022-08-14 DIAGNOSIS — G9340 Encephalopathy, unspecified: Secondary | ICD-10-CM | POA: Diagnosis not present

## 2022-08-14 DIAGNOSIS — R1312 Dysphagia, oropharyngeal phase: Secondary | ICD-10-CM | POA: Diagnosis not present

## 2022-08-14 DIAGNOSIS — M6281 Muscle weakness (generalized): Secondary | ICD-10-CM | POA: Diagnosis not present

## 2022-08-14 DIAGNOSIS — R278 Other lack of coordination: Secondary | ICD-10-CM | POA: Diagnosis not present

## 2022-08-14 DIAGNOSIS — R41841 Cognitive communication deficit: Secondary | ICD-10-CM | POA: Diagnosis not present

## 2022-08-15 DIAGNOSIS — E038 Other specified hypothyroidism: Secondary | ICD-10-CM | POA: Diagnosis not present

## 2022-08-15 DIAGNOSIS — K922 Gastrointestinal hemorrhage, unspecified: Secondary | ICD-10-CM | POA: Diagnosis not present

## 2022-08-15 DIAGNOSIS — K219 Gastro-esophageal reflux disease without esophagitis: Secondary | ICD-10-CM | POA: Diagnosis not present

## 2022-08-15 DIAGNOSIS — R41841 Cognitive communication deficit: Secondary | ICD-10-CM | POA: Diagnosis not present

## 2022-08-15 DIAGNOSIS — R1312 Dysphagia, oropharyngeal phase: Secondary | ICD-10-CM | POA: Diagnosis not present

## 2022-08-15 DIAGNOSIS — M6281 Muscle weakness (generalized): Secondary | ICD-10-CM | POA: Diagnosis not present

## 2022-08-15 DIAGNOSIS — G9340 Encephalopathy, unspecified: Secondary | ICD-10-CM | POA: Diagnosis not present

## 2022-08-15 DIAGNOSIS — R278 Other lack of coordination: Secondary | ICD-10-CM | POA: Diagnosis not present

## 2022-08-15 DIAGNOSIS — N182 Chronic kidney disease, stage 2 (mild): Secondary | ICD-10-CM | POA: Diagnosis not present

## 2022-08-15 DIAGNOSIS — R2689 Other abnormalities of gait and mobility: Secondary | ICD-10-CM | POA: Diagnosis not present

## 2022-08-16 DIAGNOSIS — R2689 Other abnormalities of gait and mobility: Secondary | ICD-10-CM | POA: Diagnosis not present

## 2022-08-16 DIAGNOSIS — G9340 Encephalopathy, unspecified: Secondary | ICD-10-CM | POA: Diagnosis not present

## 2022-08-16 DIAGNOSIS — R1312 Dysphagia, oropharyngeal phase: Secondary | ICD-10-CM | POA: Diagnosis not present

## 2022-08-16 DIAGNOSIS — R278 Other lack of coordination: Secondary | ICD-10-CM | POA: Diagnosis not present

## 2022-08-16 DIAGNOSIS — M6281 Muscle weakness (generalized): Secondary | ICD-10-CM | POA: Diagnosis not present

## 2022-08-16 DIAGNOSIS — R41841 Cognitive communication deficit: Secondary | ICD-10-CM | POA: Diagnosis not present

## 2022-08-17 DIAGNOSIS — R1312 Dysphagia, oropharyngeal phase: Secondary | ICD-10-CM | POA: Diagnosis not present

## 2022-08-17 DIAGNOSIS — R41841 Cognitive communication deficit: Secondary | ICD-10-CM | POA: Diagnosis not present

## 2022-08-17 DIAGNOSIS — R278 Other lack of coordination: Secondary | ICD-10-CM | POA: Diagnosis not present

## 2022-08-17 DIAGNOSIS — R2689 Other abnormalities of gait and mobility: Secondary | ICD-10-CM | POA: Diagnosis not present

## 2022-08-17 DIAGNOSIS — M6281 Muscle weakness (generalized): Secondary | ICD-10-CM | POA: Diagnosis not present

## 2022-08-17 DIAGNOSIS — G9340 Encephalopathy, unspecified: Secondary | ICD-10-CM | POA: Diagnosis not present

## 2022-08-18 DIAGNOSIS — R1312 Dysphagia, oropharyngeal phase: Secondary | ICD-10-CM | POA: Diagnosis not present

## 2022-08-18 DIAGNOSIS — R2689 Other abnormalities of gait and mobility: Secondary | ICD-10-CM | POA: Diagnosis not present

## 2022-08-18 DIAGNOSIS — G9340 Encephalopathy, unspecified: Secondary | ICD-10-CM | POA: Diagnosis not present

## 2022-08-18 DIAGNOSIS — E039 Hypothyroidism, unspecified: Secondary | ICD-10-CM | POA: Diagnosis not present

## 2022-08-18 DIAGNOSIS — M6281 Muscle weakness (generalized): Secondary | ICD-10-CM | POA: Diagnosis not present

## 2022-08-18 DIAGNOSIS — R278 Other lack of coordination: Secondary | ICD-10-CM | POA: Diagnosis not present

## 2022-08-18 DIAGNOSIS — E782 Mixed hyperlipidemia: Secondary | ICD-10-CM | POA: Diagnosis not present

## 2022-08-18 DIAGNOSIS — R41841 Cognitive communication deficit: Secondary | ICD-10-CM | POA: Diagnosis not present

## 2022-08-19 DIAGNOSIS — R1312 Dysphagia, oropharyngeal phase: Secondary | ICD-10-CM | POA: Diagnosis not present

## 2022-08-19 DIAGNOSIS — R2689 Other abnormalities of gait and mobility: Secondary | ICD-10-CM | POA: Diagnosis not present

## 2022-08-19 DIAGNOSIS — R41841 Cognitive communication deficit: Secondary | ICD-10-CM | POA: Diagnosis not present

## 2022-08-19 DIAGNOSIS — R278 Other lack of coordination: Secondary | ICD-10-CM | POA: Diagnosis not present

## 2022-08-19 DIAGNOSIS — G9340 Encephalopathy, unspecified: Secondary | ICD-10-CM | POA: Diagnosis not present

## 2022-08-19 DIAGNOSIS — M6281 Muscle weakness (generalized): Secondary | ICD-10-CM | POA: Diagnosis not present

## 2022-08-20 DIAGNOSIS — R1312 Dysphagia, oropharyngeal phase: Secondary | ICD-10-CM | POA: Diagnosis not present

## 2022-08-20 DIAGNOSIS — G9340 Encephalopathy, unspecified: Secondary | ICD-10-CM | POA: Diagnosis not present

## 2022-08-20 DIAGNOSIS — R278 Other lack of coordination: Secondary | ICD-10-CM | POA: Diagnosis not present

## 2022-08-20 DIAGNOSIS — R2689 Other abnormalities of gait and mobility: Secondary | ICD-10-CM | POA: Diagnosis not present

## 2022-08-20 DIAGNOSIS — R41841 Cognitive communication deficit: Secondary | ICD-10-CM | POA: Diagnosis not present

## 2022-08-20 DIAGNOSIS — M6281 Muscle weakness (generalized): Secondary | ICD-10-CM | POA: Diagnosis not present

## 2022-08-21 DIAGNOSIS — K922 Gastrointestinal hemorrhage, unspecified: Secondary | ICD-10-CM | POA: Diagnosis not present

## 2022-08-21 DIAGNOSIS — E1122 Type 2 diabetes mellitus with diabetic chronic kidney disease: Secondary | ICD-10-CM | POA: Diagnosis not present

## 2022-08-21 DIAGNOSIS — K2091 Esophagitis, unspecified with bleeding: Secondary | ICD-10-CM | POA: Diagnosis not present

## 2022-08-21 DIAGNOSIS — R001 Bradycardia, unspecified: Secondary | ICD-10-CM | POA: Diagnosis not present

## 2022-08-21 DIAGNOSIS — R41841 Cognitive communication deficit: Secondary | ICD-10-CM | POA: Diagnosis not present

## 2022-08-21 DIAGNOSIS — E038 Other specified hypothyroidism: Secondary | ICD-10-CM | POA: Diagnosis not present

## 2022-08-21 DIAGNOSIS — R2689 Other abnormalities of gait and mobility: Secondary | ICD-10-CM | POA: Diagnosis not present

## 2022-08-21 DIAGNOSIS — R278 Other lack of coordination: Secondary | ICD-10-CM | POA: Diagnosis not present

## 2022-08-21 DIAGNOSIS — M6281 Muscle weakness (generalized): Secondary | ICD-10-CM | POA: Diagnosis not present

## 2022-08-21 DIAGNOSIS — R1312 Dysphagia, oropharyngeal phase: Secondary | ICD-10-CM | POA: Diagnosis not present

## 2022-08-21 DIAGNOSIS — G9340 Encephalopathy, unspecified: Secondary | ICD-10-CM | POA: Diagnosis not present

## 2022-08-21 DIAGNOSIS — N182 Chronic kidney disease, stage 2 (mild): Secondary | ICD-10-CM | POA: Diagnosis not present

## 2022-08-21 DIAGNOSIS — K219 Gastro-esophageal reflux disease without esophagitis: Secondary | ICD-10-CM | POA: Diagnosis not present

## 2022-08-21 DIAGNOSIS — D508 Other iron deficiency anemias: Secondary | ICD-10-CM | POA: Diagnosis not present

## 2022-08-22 DIAGNOSIS — R41841 Cognitive communication deficit: Secondary | ICD-10-CM | POA: Diagnosis not present

## 2022-08-22 DIAGNOSIS — R1312 Dysphagia, oropharyngeal phase: Secondary | ICD-10-CM | POA: Diagnosis not present

## 2022-08-22 DIAGNOSIS — R278 Other lack of coordination: Secondary | ICD-10-CM | POA: Diagnosis not present

## 2022-08-22 DIAGNOSIS — G9340 Encephalopathy, unspecified: Secondary | ICD-10-CM | POA: Diagnosis not present

## 2022-08-22 DIAGNOSIS — M6281 Muscle weakness (generalized): Secondary | ICD-10-CM | POA: Diagnosis not present

## 2022-08-22 DIAGNOSIS — H25813 Combined forms of age-related cataract, bilateral: Secondary | ICD-10-CM | POA: Diagnosis not present

## 2022-08-22 DIAGNOSIS — R2689 Other abnormalities of gait and mobility: Secondary | ICD-10-CM | POA: Diagnosis not present

## 2022-08-22 DIAGNOSIS — E119 Type 2 diabetes mellitus without complications: Secondary | ICD-10-CM | POA: Diagnosis not present

## 2022-08-23 DIAGNOSIS — R2689 Other abnormalities of gait and mobility: Secondary | ICD-10-CM | POA: Diagnosis not present

## 2022-08-23 DIAGNOSIS — R278 Other lack of coordination: Secondary | ICD-10-CM | POA: Diagnosis not present

## 2022-08-23 DIAGNOSIS — R1312 Dysphagia, oropharyngeal phase: Secondary | ICD-10-CM | POA: Diagnosis not present

## 2022-08-23 DIAGNOSIS — E119 Type 2 diabetes mellitus without complications: Secondary | ICD-10-CM | POA: Diagnosis not present

## 2022-08-23 DIAGNOSIS — E559 Vitamin D deficiency, unspecified: Secondary | ICD-10-CM | POA: Diagnosis not present

## 2022-08-23 DIAGNOSIS — I10 Essential (primary) hypertension: Secondary | ICD-10-CM | POA: Diagnosis not present

## 2022-08-23 DIAGNOSIS — D649 Anemia, unspecified: Secondary | ICD-10-CM | POA: Diagnosis not present

## 2022-08-23 DIAGNOSIS — G9340 Encephalopathy, unspecified: Secondary | ICD-10-CM | POA: Diagnosis not present

## 2022-08-23 DIAGNOSIS — M6281 Muscle weakness (generalized): Secondary | ICD-10-CM | POA: Diagnosis not present

## 2022-08-23 DIAGNOSIS — R41841 Cognitive communication deficit: Secondary | ICD-10-CM | POA: Diagnosis not present

## 2022-08-24 DIAGNOSIS — R278 Other lack of coordination: Secondary | ICD-10-CM | POA: Diagnosis not present

## 2022-08-24 DIAGNOSIS — R2689 Other abnormalities of gait and mobility: Secondary | ICD-10-CM | POA: Diagnosis not present

## 2022-08-24 DIAGNOSIS — R1312 Dysphagia, oropharyngeal phase: Secondary | ICD-10-CM | POA: Diagnosis not present

## 2022-08-24 DIAGNOSIS — G9340 Encephalopathy, unspecified: Secondary | ICD-10-CM | POA: Diagnosis not present

## 2022-08-24 DIAGNOSIS — M6281 Muscle weakness (generalized): Secondary | ICD-10-CM | POA: Diagnosis not present

## 2022-08-24 DIAGNOSIS — R41841 Cognitive communication deficit: Secondary | ICD-10-CM | POA: Diagnosis not present

## 2022-08-25 DIAGNOSIS — R278 Other lack of coordination: Secondary | ICD-10-CM | POA: Diagnosis not present

## 2022-08-25 DIAGNOSIS — R41841 Cognitive communication deficit: Secondary | ICD-10-CM | POA: Diagnosis not present

## 2022-08-25 DIAGNOSIS — R1312 Dysphagia, oropharyngeal phase: Secondary | ICD-10-CM | POA: Diagnosis not present

## 2022-08-25 DIAGNOSIS — M6281 Muscle weakness (generalized): Secondary | ICD-10-CM | POA: Diagnosis not present

## 2022-08-25 DIAGNOSIS — R2689 Other abnormalities of gait and mobility: Secondary | ICD-10-CM | POA: Diagnosis not present

## 2022-08-25 DIAGNOSIS — G9340 Encephalopathy, unspecified: Secondary | ICD-10-CM | POA: Diagnosis not present

## 2022-08-26 DIAGNOSIS — R1312 Dysphagia, oropharyngeal phase: Secondary | ICD-10-CM | POA: Diagnosis not present

## 2022-08-26 DIAGNOSIS — M6281 Muscle weakness (generalized): Secondary | ICD-10-CM | POA: Diagnosis not present

## 2022-08-26 DIAGNOSIS — G9340 Encephalopathy, unspecified: Secondary | ICD-10-CM | POA: Diagnosis not present

## 2022-08-26 DIAGNOSIS — R41841 Cognitive communication deficit: Secondary | ICD-10-CM | POA: Diagnosis not present

## 2022-08-26 DIAGNOSIS — R2689 Other abnormalities of gait and mobility: Secondary | ICD-10-CM | POA: Diagnosis not present

## 2022-08-26 DIAGNOSIS — R278 Other lack of coordination: Secondary | ICD-10-CM | POA: Diagnosis not present

## 2022-08-27 DIAGNOSIS — R1312 Dysphagia, oropharyngeal phase: Secondary | ICD-10-CM | POA: Diagnosis not present

## 2022-08-27 DIAGNOSIS — G9340 Encephalopathy, unspecified: Secondary | ICD-10-CM | POA: Diagnosis not present

## 2022-08-27 DIAGNOSIS — R41841 Cognitive communication deficit: Secondary | ICD-10-CM | POA: Diagnosis not present

## 2022-08-27 DIAGNOSIS — R278 Other lack of coordination: Secondary | ICD-10-CM | POA: Diagnosis not present

## 2022-08-27 DIAGNOSIS — R2689 Other abnormalities of gait and mobility: Secondary | ICD-10-CM | POA: Diagnosis not present

## 2022-08-27 DIAGNOSIS — M6281 Muscle weakness (generalized): Secondary | ICD-10-CM | POA: Diagnosis not present

## 2022-08-28 DIAGNOSIS — R1312 Dysphagia, oropharyngeal phase: Secondary | ICD-10-CM | POA: Diagnosis not present

## 2022-08-28 DIAGNOSIS — R2689 Other abnormalities of gait and mobility: Secondary | ICD-10-CM | POA: Diagnosis not present

## 2022-08-28 DIAGNOSIS — M6281 Muscle weakness (generalized): Secondary | ICD-10-CM | POA: Diagnosis not present

## 2022-08-28 DIAGNOSIS — G9340 Encephalopathy, unspecified: Secondary | ICD-10-CM | POA: Diagnosis not present

## 2022-08-28 DIAGNOSIS — R41841 Cognitive communication deficit: Secondary | ICD-10-CM | POA: Diagnosis not present

## 2022-08-28 DIAGNOSIS — R278 Other lack of coordination: Secondary | ICD-10-CM | POA: Diagnosis not present

## 2022-08-29 DIAGNOSIS — M6281 Muscle weakness (generalized): Secondary | ICD-10-CM | POA: Diagnosis not present

## 2022-08-29 DIAGNOSIS — R2689 Other abnormalities of gait and mobility: Secondary | ICD-10-CM | POA: Diagnosis not present

## 2022-08-29 DIAGNOSIS — R278 Other lack of coordination: Secondary | ICD-10-CM | POA: Diagnosis not present

## 2022-08-29 DIAGNOSIS — R41841 Cognitive communication deficit: Secondary | ICD-10-CM | POA: Diagnosis not present

## 2022-08-29 DIAGNOSIS — R1312 Dysphagia, oropharyngeal phase: Secondary | ICD-10-CM | POA: Diagnosis not present

## 2022-08-29 DIAGNOSIS — G9340 Encephalopathy, unspecified: Secondary | ICD-10-CM | POA: Diagnosis not present

## 2022-08-30 DIAGNOSIS — M6281 Muscle weakness (generalized): Secondary | ICD-10-CM | POA: Diagnosis not present

## 2022-08-30 DIAGNOSIS — G9340 Encephalopathy, unspecified: Secondary | ICD-10-CM | POA: Diagnosis not present

## 2022-08-30 DIAGNOSIS — R278 Other lack of coordination: Secondary | ICD-10-CM | POA: Diagnosis not present

## 2022-08-30 DIAGNOSIS — R41841 Cognitive communication deficit: Secondary | ICD-10-CM | POA: Diagnosis not present

## 2022-08-30 DIAGNOSIS — R1312 Dysphagia, oropharyngeal phase: Secondary | ICD-10-CM | POA: Diagnosis not present

## 2022-08-30 DIAGNOSIS — R2689 Other abnormalities of gait and mobility: Secondary | ICD-10-CM | POA: Diagnosis not present

## 2022-08-31 DIAGNOSIS — R1312 Dysphagia, oropharyngeal phase: Secondary | ICD-10-CM | POA: Diagnosis not present

## 2022-08-31 DIAGNOSIS — R41841 Cognitive communication deficit: Secondary | ICD-10-CM | POA: Diagnosis not present

## 2022-08-31 DIAGNOSIS — R278 Other lack of coordination: Secondary | ICD-10-CM | POA: Diagnosis not present

## 2022-08-31 DIAGNOSIS — M6281 Muscle weakness (generalized): Secondary | ICD-10-CM | POA: Diagnosis not present

## 2022-08-31 DIAGNOSIS — R2689 Other abnormalities of gait and mobility: Secondary | ICD-10-CM | POA: Diagnosis not present

## 2022-08-31 DIAGNOSIS — G9340 Encephalopathy, unspecified: Secondary | ICD-10-CM | POA: Diagnosis not present

## 2022-09-01 DIAGNOSIS — R2689 Other abnormalities of gait and mobility: Secondary | ICD-10-CM | POA: Diagnosis not present

## 2022-09-01 DIAGNOSIS — R278 Other lack of coordination: Secondary | ICD-10-CM | POA: Diagnosis not present

## 2022-09-01 DIAGNOSIS — G9340 Encephalopathy, unspecified: Secondary | ICD-10-CM | POA: Diagnosis not present

## 2022-09-01 DIAGNOSIS — M6281 Muscle weakness (generalized): Secondary | ICD-10-CM | POA: Diagnosis not present

## 2022-09-01 DIAGNOSIS — R41841 Cognitive communication deficit: Secondary | ICD-10-CM | POA: Diagnosis not present

## 2022-09-01 DIAGNOSIS — R1312 Dysphagia, oropharyngeal phase: Secondary | ICD-10-CM | POA: Diagnosis not present

## 2022-09-03 DIAGNOSIS — R1312 Dysphagia, oropharyngeal phase: Secondary | ICD-10-CM | POA: Diagnosis not present

## 2022-09-03 DIAGNOSIS — G9340 Encephalopathy, unspecified: Secondary | ICD-10-CM | POA: Diagnosis not present

## 2022-09-03 DIAGNOSIS — R2689 Other abnormalities of gait and mobility: Secondary | ICD-10-CM | POA: Diagnosis not present

## 2022-09-03 DIAGNOSIS — R278 Other lack of coordination: Secondary | ICD-10-CM | POA: Diagnosis not present

## 2022-09-03 DIAGNOSIS — R41841 Cognitive communication deficit: Secondary | ICD-10-CM | POA: Diagnosis not present

## 2022-09-03 DIAGNOSIS — M6281 Muscle weakness (generalized): Secondary | ICD-10-CM | POA: Diagnosis not present

## 2022-09-04 DIAGNOSIS — G9340 Encephalopathy, unspecified: Secondary | ICD-10-CM | POA: Diagnosis not present

## 2022-09-04 DIAGNOSIS — R41841 Cognitive communication deficit: Secondary | ICD-10-CM | POA: Diagnosis not present

## 2022-09-04 DIAGNOSIS — R1312 Dysphagia, oropharyngeal phase: Secondary | ICD-10-CM | POA: Diagnosis not present

## 2022-09-04 DIAGNOSIS — R278 Other lack of coordination: Secondary | ICD-10-CM | POA: Diagnosis not present

## 2022-09-04 DIAGNOSIS — M6281 Muscle weakness (generalized): Secondary | ICD-10-CM | POA: Diagnosis not present

## 2022-09-04 DIAGNOSIS — R2689 Other abnormalities of gait and mobility: Secondary | ICD-10-CM | POA: Diagnosis not present

## 2022-09-05 DIAGNOSIS — R41841 Cognitive communication deficit: Secondary | ICD-10-CM | POA: Diagnosis not present

## 2022-09-05 DIAGNOSIS — R278 Other lack of coordination: Secondary | ICD-10-CM | POA: Diagnosis not present

## 2022-09-05 DIAGNOSIS — R2689 Other abnormalities of gait and mobility: Secondary | ICD-10-CM | POA: Diagnosis not present

## 2022-09-05 DIAGNOSIS — G9340 Encephalopathy, unspecified: Secondary | ICD-10-CM | POA: Diagnosis not present

## 2022-09-05 DIAGNOSIS — R1312 Dysphagia, oropharyngeal phase: Secondary | ICD-10-CM | POA: Diagnosis not present

## 2022-09-05 DIAGNOSIS — M6281 Muscle weakness (generalized): Secondary | ICD-10-CM | POA: Diagnosis not present

## 2022-09-07 DIAGNOSIS — G9340 Encephalopathy, unspecified: Secondary | ICD-10-CM | POA: Diagnosis not present

## 2022-09-07 DIAGNOSIS — R278 Other lack of coordination: Secondary | ICD-10-CM | POA: Diagnosis not present

## 2022-09-07 DIAGNOSIS — M6281 Muscle weakness (generalized): Secondary | ICD-10-CM | POA: Diagnosis not present

## 2022-09-07 DIAGNOSIS — R2689 Other abnormalities of gait and mobility: Secondary | ICD-10-CM | POA: Diagnosis not present

## 2022-09-08 DIAGNOSIS — R278 Other lack of coordination: Secondary | ICD-10-CM | POA: Diagnosis not present

## 2022-09-08 DIAGNOSIS — R2689 Other abnormalities of gait and mobility: Secondary | ICD-10-CM | POA: Diagnosis not present

## 2022-09-08 DIAGNOSIS — G9340 Encephalopathy, unspecified: Secondary | ICD-10-CM | POA: Diagnosis not present

## 2022-09-08 DIAGNOSIS — M6281 Muscle weakness (generalized): Secondary | ICD-10-CM | POA: Diagnosis not present

## 2022-09-10 DIAGNOSIS — M6281 Muscle weakness (generalized): Secondary | ICD-10-CM | POA: Diagnosis not present

## 2022-09-10 DIAGNOSIS — R2689 Other abnormalities of gait and mobility: Secondary | ICD-10-CM | POA: Diagnosis not present

## 2022-09-10 DIAGNOSIS — R278 Other lack of coordination: Secondary | ICD-10-CM | POA: Diagnosis not present

## 2022-09-10 DIAGNOSIS — G9340 Encephalopathy, unspecified: Secondary | ICD-10-CM | POA: Diagnosis not present

## 2022-09-11 DIAGNOSIS — G9340 Encephalopathy, unspecified: Secondary | ICD-10-CM | POA: Diagnosis not present

## 2022-09-11 DIAGNOSIS — R278 Other lack of coordination: Secondary | ICD-10-CM | POA: Diagnosis not present

## 2022-09-11 DIAGNOSIS — R2689 Other abnormalities of gait and mobility: Secondary | ICD-10-CM | POA: Diagnosis not present

## 2022-09-11 DIAGNOSIS — M6281 Muscle weakness (generalized): Secondary | ICD-10-CM | POA: Diagnosis not present

## 2022-09-12 DIAGNOSIS — R2689 Other abnormalities of gait and mobility: Secondary | ICD-10-CM | POA: Diagnosis not present

## 2022-09-12 DIAGNOSIS — M6281 Muscle weakness (generalized): Secondary | ICD-10-CM | POA: Diagnosis not present

## 2022-09-12 DIAGNOSIS — G9340 Encephalopathy, unspecified: Secondary | ICD-10-CM | POA: Diagnosis not present

## 2022-09-12 DIAGNOSIS — R278 Other lack of coordination: Secondary | ICD-10-CM | POA: Diagnosis not present

## 2022-09-14 DIAGNOSIS — G9340 Encephalopathy, unspecified: Secondary | ICD-10-CM | POA: Diagnosis not present

## 2022-09-14 DIAGNOSIS — R2689 Other abnormalities of gait and mobility: Secondary | ICD-10-CM | POA: Diagnosis not present

## 2022-09-14 DIAGNOSIS — R278 Other lack of coordination: Secondary | ICD-10-CM | POA: Diagnosis not present

## 2022-09-14 DIAGNOSIS — M6281 Muscle weakness (generalized): Secondary | ICD-10-CM | POA: Diagnosis not present

## 2022-09-19 DIAGNOSIS — R001 Bradycardia, unspecified: Secondary | ICD-10-CM | POA: Diagnosis not present

## 2022-09-19 DIAGNOSIS — E1122 Type 2 diabetes mellitus with diabetic chronic kidney disease: Secondary | ICD-10-CM | POA: Diagnosis not present

## 2022-09-19 DIAGNOSIS — M6281 Muscle weakness (generalized): Secondary | ICD-10-CM | POA: Diagnosis not present

## 2022-09-19 DIAGNOSIS — E038 Other specified hypothyroidism: Secondary | ICD-10-CM | POA: Diagnosis not present

## 2022-09-19 DIAGNOSIS — K2091 Esophagitis, unspecified with bleeding: Secondary | ICD-10-CM | POA: Diagnosis not present

## 2022-09-19 DIAGNOSIS — D508 Other iron deficiency anemias: Secondary | ICD-10-CM | POA: Diagnosis not present

## 2022-09-19 DIAGNOSIS — K219 Gastro-esophageal reflux disease without esophagitis: Secondary | ICD-10-CM | POA: Diagnosis not present

## 2022-09-19 DIAGNOSIS — K922 Gastrointestinal hemorrhage, unspecified: Secondary | ICD-10-CM | POA: Diagnosis not present

## 2022-09-19 DIAGNOSIS — N182 Chronic kidney disease, stage 2 (mild): Secondary | ICD-10-CM | POA: Diagnosis not present

## 2022-10-09 DIAGNOSIS — Z7984 Long term (current) use of oral hypoglycemic drugs: Secondary | ICD-10-CM | POA: Diagnosis not present

## 2022-10-09 DIAGNOSIS — M2141 Flat foot [pes planus] (acquired), right foot: Secondary | ICD-10-CM | POA: Diagnosis not present

## 2022-10-09 DIAGNOSIS — E1151 Type 2 diabetes mellitus with diabetic peripheral angiopathy without gangrene: Secondary | ICD-10-CM | POA: Diagnosis not present

## 2022-10-09 DIAGNOSIS — B351 Tinea unguium: Secondary | ICD-10-CM | POA: Diagnosis not present

## 2022-10-10 DIAGNOSIS — R059 Cough, unspecified: Secondary | ICD-10-CM | POA: Diagnosis not present

## 2022-10-10 DIAGNOSIS — R051 Acute cough: Secondary | ICD-10-CM | POA: Diagnosis not present

## 2022-10-10 DIAGNOSIS — R0989 Other specified symptoms and signs involving the circulatory and respiratory systems: Secondary | ICD-10-CM | POA: Diagnosis not present

## 2022-10-16 DIAGNOSIS — E782 Mixed hyperlipidemia: Secondary | ICD-10-CM | POA: Diagnosis not present

## 2022-10-16 DIAGNOSIS — E039 Hypothyroidism, unspecified: Secondary | ICD-10-CM | POA: Diagnosis not present

## 2022-10-17 DIAGNOSIS — K922 Gastrointestinal hemorrhage, unspecified: Secondary | ICD-10-CM | POA: Diagnosis not present

## 2022-10-17 DIAGNOSIS — M6281 Muscle weakness (generalized): Secondary | ICD-10-CM | POA: Diagnosis not present

## 2022-10-17 DIAGNOSIS — N182 Chronic kidney disease, stage 2 (mild): Secondary | ICD-10-CM | POA: Diagnosis not present

## 2022-10-17 DIAGNOSIS — E038 Other specified hypothyroidism: Secondary | ICD-10-CM | POA: Diagnosis not present

## 2022-10-17 DIAGNOSIS — D508 Other iron deficiency anemias: Secondary | ICD-10-CM | POA: Diagnosis not present

## 2022-10-17 DIAGNOSIS — K219 Gastro-esophageal reflux disease without esophagitis: Secondary | ICD-10-CM | POA: Diagnosis not present

## 2022-10-17 DIAGNOSIS — R001 Bradycardia, unspecified: Secondary | ICD-10-CM | POA: Diagnosis not present

## 2022-10-17 DIAGNOSIS — R051 Acute cough: Secondary | ICD-10-CM | POA: Diagnosis not present

## 2022-10-17 DIAGNOSIS — E1122 Type 2 diabetes mellitus with diabetic chronic kidney disease: Secondary | ICD-10-CM | POA: Diagnosis not present

## 2022-10-17 DIAGNOSIS — K2091 Esophagitis, unspecified with bleeding: Secondary | ICD-10-CM | POA: Diagnosis not present

## 2022-10-23 DIAGNOSIS — M6281 Muscle weakness (generalized): Secondary | ICD-10-CM | POA: Diagnosis not present

## 2022-10-23 DIAGNOSIS — G9340 Encephalopathy, unspecified: Secondary | ICD-10-CM | POA: Diagnosis not present

## 2022-10-23 DIAGNOSIS — R2681 Unsteadiness on feet: Secondary | ICD-10-CM | POA: Diagnosis not present

## 2022-10-23 DIAGNOSIS — R278 Other lack of coordination: Secondary | ICD-10-CM | POA: Diagnosis not present

## 2022-10-25 DIAGNOSIS — M6281 Muscle weakness (generalized): Secondary | ICD-10-CM | POA: Diagnosis not present

## 2022-10-25 DIAGNOSIS — R2681 Unsteadiness on feet: Secondary | ICD-10-CM | POA: Diagnosis not present

## 2022-10-25 DIAGNOSIS — G9340 Encephalopathy, unspecified: Secondary | ICD-10-CM | POA: Diagnosis not present

## 2022-10-25 DIAGNOSIS — R278 Other lack of coordination: Secondary | ICD-10-CM | POA: Diagnosis not present

## 2022-10-26 DIAGNOSIS — G9340 Encephalopathy, unspecified: Secondary | ICD-10-CM | POA: Diagnosis not present

## 2022-10-26 DIAGNOSIS — R278 Other lack of coordination: Secondary | ICD-10-CM | POA: Diagnosis not present

## 2022-10-26 DIAGNOSIS — M6281 Muscle weakness (generalized): Secondary | ICD-10-CM | POA: Diagnosis not present

## 2022-10-26 DIAGNOSIS — R2681 Unsteadiness on feet: Secondary | ICD-10-CM | POA: Diagnosis not present

## 2022-10-27 DIAGNOSIS — R278 Other lack of coordination: Secondary | ICD-10-CM | POA: Diagnosis not present

## 2022-10-27 DIAGNOSIS — R2681 Unsteadiness on feet: Secondary | ICD-10-CM | POA: Diagnosis not present

## 2022-10-27 DIAGNOSIS — G9340 Encephalopathy, unspecified: Secondary | ICD-10-CM | POA: Diagnosis not present

## 2022-10-27 DIAGNOSIS — M6281 Muscle weakness (generalized): Secondary | ICD-10-CM | POA: Diagnosis not present

## 2022-10-28 DIAGNOSIS — M6281 Muscle weakness (generalized): Secondary | ICD-10-CM | POA: Diagnosis not present

## 2022-10-28 DIAGNOSIS — G9340 Encephalopathy, unspecified: Secondary | ICD-10-CM | POA: Diagnosis not present

## 2022-10-28 DIAGNOSIS — R2681 Unsteadiness on feet: Secondary | ICD-10-CM | POA: Diagnosis not present

## 2022-10-28 DIAGNOSIS — R278 Other lack of coordination: Secondary | ICD-10-CM | POA: Diagnosis not present

## 2022-10-30 DIAGNOSIS — M6281 Muscle weakness (generalized): Secondary | ICD-10-CM | POA: Diagnosis not present

## 2022-10-30 DIAGNOSIS — R278 Other lack of coordination: Secondary | ICD-10-CM | POA: Diagnosis not present

## 2022-10-30 DIAGNOSIS — R2681 Unsteadiness on feet: Secondary | ICD-10-CM | POA: Diagnosis not present

## 2022-10-30 DIAGNOSIS — G9340 Encephalopathy, unspecified: Secondary | ICD-10-CM | POA: Diagnosis not present

## 2022-10-31 DIAGNOSIS — R278 Other lack of coordination: Secondary | ICD-10-CM | POA: Diagnosis not present

## 2022-10-31 DIAGNOSIS — R2681 Unsteadiness on feet: Secondary | ICD-10-CM | POA: Diagnosis not present

## 2022-10-31 DIAGNOSIS — M6281 Muscle weakness (generalized): Secondary | ICD-10-CM | POA: Diagnosis not present

## 2022-10-31 DIAGNOSIS — G9340 Encephalopathy, unspecified: Secondary | ICD-10-CM | POA: Diagnosis not present

## 2022-11-01 DIAGNOSIS — H2513 Age-related nuclear cataract, bilateral: Secondary | ICD-10-CM | POA: Diagnosis not present

## 2022-11-02 DIAGNOSIS — M6281 Muscle weakness (generalized): Secondary | ICD-10-CM | POA: Diagnosis not present

## 2022-11-02 DIAGNOSIS — R278 Other lack of coordination: Secondary | ICD-10-CM | POA: Diagnosis not present

## 2022-11-02 DIAGNOSIS — R2681 Unsteadiness on feet: Secondary | ICD-10-CM | POA: Diagnosis not present

## 2022-11-02 DIAGNOSIS — G9340 Encephalopathy, unspecified: Secondary | ICD-10-CM | POA: Diagnosis not present

## 2022-11-03 DIAGNOSIS — G9340 Encephalopathy, unspecified: Secondary | ICD-10-CM | POA: Diagnosis not present

## 2022-11-03 DIAGNOSIS — R278 Other lack of coordination: Secondary | ICD-10-CM | POA: Diagnosis not present

## 2022-11-03 DIAGNOSIS — M6281 Muscle weakness (generalized): Secondary | ICD-10-CM | POA: Diagnosis not present

## 2022-11-03 DIAGNOSIS — R2681 Unsteadiness on feet: Secondary | ICD-10-CM | POA: Diagnosis not present

## 2022-11-06 DIAGNOSIS — R278 Other lack of coordination: Secondary | ICD-10-CM | POA: Diagnosis not present

## 2022-11-06 DIAGNOSIS — G9340 Encephalopathy, unspecified: Secondary | ICD-10-CM | POA: Diagnosis not present

## 2022-11-06 DIAGNOSIS — R2681 Unsteadiness on feet: Secondary | ICD-10-CM | POA: Diagnosis not present

## 2022-11-06 DIAGNOSIS — M6281 Muscle weakness (generalized): Secondary | ICD-10-CM | POA: Diagnosis not present

## 2022-11-08 DIAGNOSIS — G9340 Encephalopathy, unspecified: Secondary | ICD-10-CM | POA: Diagnosis not present

## 2022-11-08 DIAGNOSIS — R2681 Unsteadiness on feet: Secondary | ICD-10-CM | POA: Diagnosis not present

## 2022-11-08 DIAGNOSIS — M6281 Muscle weakness (generalized): Secondary | ICD-10-CM | POA: Diagnosis not present

## 2022-11-08 DIAGNOSIS — R278 Other lack of coordination: Secondary | ICD-10-CM | POA: Diagnosis not present

## 2022-11-13 DIAGNOSIS — M6281 Muscle weakness (generalized): Secondary | ICD-10-CM | POA: Diagnosis not present

## 2022-11-13 DIAGNOSIS — G9340 Encephalopathy, unspecified: Secondary | ICD-10-CM | POA: Diagnosis not present

## 2022-11-13 DIAGNOSIS — R278 Other lack of coordination: Secondary | ICD-10-CM | POA: Diagnosis not present

## 2022-11-13 DIAGNOSIS — R2681 Unsteadiness on feet: Secondary | ICD-10-CM | POA: Diagnosis not present

## 2022-11-20 DIAGNOSIS — K2091 Esophagitis, unspecified with bleeding: Secondary | ICD-10-CM | POA: Diagnosis not present

## 2022-11-20 DIAGNOSIS — D508 Other iron deficiency anemias: Secondary | ICD-10-CM | POA: Diagnosis not present

## 2022-11-20 DIAGNOSIS — M6281 Muscle weakness (generalized): Secondary | ICD-10-CM | POA: Diagnosis not present

## 2022-11-20 DIAGNOSIS — N182 Chronic kidney disease, stage 2 (mild): Secondary | ICD-10-CM | POA: Diagnosis not present

## 2022-11-20 DIAGNOSIS — K922 Gastrointestinal hemorrhage, unspecified: Secondary | ICD-10-CM | POA: Diagnosis not present

## 2022-11-20 DIAGNOSIS — K219 Gastro-esophageal reflux disease without esophagitis: Secondary | ICD-10-CM | POA: Diagnosis not present

## 2022-11-20 DIAGNOSIS — R051 Acute cough: Secondary | ICD-10-CM | POA: Diagnosis not present

## 2022-11-20 DIAGNOSIS — R001 Bradycardia, unspecified: Secondary | ICD-10-CM | POA: Diagnosis not present

## 2022-11-20 DIAGNOSIS — E038 Other specified hypothyroidism: Secondary | ICD-10-CM | POA: Diagnosis not present

## 2022-11-20 DIAGNOSIS — E1122 Type 2 diabetes mellitus with diabetic chronic kidney disease: Secondary | ICD-10-CM | POA: Diagnosis not present

## 2022-11-23 DIAGNOSIS — I10 Essential (primary) hypertension: Secondary | ICD-10-CM | POA: Diagnosis not present

## 2022-11-23 DIAGNOSIS — E119 Type 2 diabetes mellitus without complications: Secondary | ICD-10-CM | POA: Diagnosis not present

## 2022-11-23 DIAGNOSIS — E875 Hyperkalemia: Secondary | ICD-10-CM | POA: Diagnosis not present

## 2022-11-24 DIAGNOSIS — R051 Acute cough: Secondary | ICD-10-CM | POA: Diagnosis not present

## 2022-11-24 DIAGNOSIS — E875 Hyperkalemia: Secondary | ICD-10-CM | POA: Diagnosis not present

## 2022-11-24 DIAGNOSIS — J1281 Pneumonia due to SARS-associated coronavirus: Secondary | ICD-10-CM | POA: Diagnosis not present

## 2022-11-24 DIAGNOSIS — R0989 Other specified symptoms and signs involving the circulatory and respiratory systems: Secondary | ICD-10-CM | POA: Diagnosis not present

## 2022-11-24 DIAGNOSIS — Z8616 Personal history of COVID-19: Secondary | ICD-10-CM | POA: Diagnosis not present

## 2022-11-24 DIAGNOSIS — I9589 Other hypotension: Secondary | ICD-10-CM | POA: Diagnosis not present

## 2022-11-25 DIAGNOSIS — I10 Essential (primary) hypertension: Secondary | ICD-10-CM | POA: Diagnosis not present

## 2022-11-27 DIAGNOSIS — E875 Hyperkalemia: Secondary | ICD-10-CM | POA: Diagnosis not present

## 2022-11-27 DIAGNOSIS — Z8616 Personal history of COVID-19: Secondary | ICD-10-CM | POA: Diagnosis not present

## 2022-11-27 DIAGNOSIS — I9589 Other hypotension: Secondary | ICD-10-CM | POA: Diagnosis not present

## 2022-11-27 DIAGNOSIS — D508 Other iron deficiency anemias: Secondary | ICD-10-CM | POA: Diagnosis not present

## 2022-11-27 DIAGNOSIS — E1122 Type 2 diabetes mellitus with diabetic chronic kidney disease: Secondary | ICD-10-CM | POA: Diagnosis not present

## 2022-11-28 DIAGNOSIS — I10 Essential (primary) hypertension: Secondary | ICD-10-CM | POA: Diagnosis not present

## 2022-11-28 DIAGNOSIS — Z8616 Personal history of COVID-19: Secondary | ICD-10-CM | POA: Diagnosis not present

## 2022-12-11 DIAGNOSIS — B342 Coronavirus infection, unspecified: Secondary | ICD-10-CM | POA: Diagnosis not present

## 2022-12-11 DIAGNOSIS — R4182 Altered mental status, unspecified: Secondary | ICD-10-CM | POA: Diagnosis not present

## 2022-12-11 DIAGNOSIS — R051 Acute cough: Secondary | ICD-10-CM | POA: Diagnosis not present

## 2022-12-11 DIAGNOSIS — R519 Headache, unspecified: Secondary | ICD-10-CM | POA: Diagnosis not present

## 2022-12-12 ENCOUNTER — Other Ambulatory Visit: Payer: Self-pay

## 2022-12-12 ENCOUNTER — Emergency Department (HOSPITAL_COMMUNITY): Payer: Medicare Other

## 2022-12-12 ENCOUNTER — Observation Stay (HOSPITAL_COMMUNITY)
Admission: EM | Admit: 2022-12-12 | Discharge: 2022-12-13 | Disposition: A | Discharge status: Home / Self Care | Payer: Medicare Other | Attending: Internal Medicine | Admitting: Internal Medicine

## 2022-12-12 ENCOUNTER — Observation Stay (HOSPITAL_COMMUNITY): Payer: Medicare Other

## 2022-12-12 ENCOUNTER — Encounter (HOSPITAL_COMMUNITY): Payer: Self-pay | Admitting: Internal Medicine

## 2022-12-12 DIAGNOSIS — E669 Obesity, unspecified: Secondary | ICD-10-CM | POA: Insufficient documentation

## 2022-12-12 DIAGNOSIS — Z85038 Personal history of other malignant neoplasm of large intestine: Secondary | ICD-10-CM | POA: Insufficient documentation

## 2022-12-12 DIAGNOSIS — K219 Gastro-esophageal reflux disease without esophagitis: Secondary | ICD-10-CM | POA: Diagnosis present

## 2022-12-12 DIAGNOSIS — Z8616 Personal history of COVID-19: Secondary | ICD-10-CM | POA: Insufficient documentation

## 2022-12-12 DIAGNOSIS — E782 Mixed hyperlipidemia: Secondary | ICD-10-CM

## 2022-12-12 DIAGNOSIS — D509 Iron deficiency anemia, unspecified: Secondary | ICD-10-CM | POA: Insufficient documentation

## 2022-12-12 DIAGNOSIS — F70 Mild intellectual disabilities: Principal | ICD-10-CM | POA: Insufficient documentation

## 2022-12-12 DIAGNOSIS — J9811 Atelectasis: Secondary | ICD-10-CM | POA: Diagnosis not present

## 2022-12-12 DIAGNOSIS — R5383 Other fatigue: Secondary | ICD-10-CM | POA: Diagnosis not present

## 2022-12-12 DIAGNOSIS — I10 Essential (primary) hypertension: Secondary | ICD-10-CM | POA: Diagnosis not present

## 2022-12-12 DIAGNOSIS — F79 Unspecified intellectual disabilities: Secondary | ICD-10-CM

## 2022-12-12 DIAGNOSIS — R4182 Altered mental status, unspecified: Secondary | ICD-10-CM | POA: Diagnosis not present

## 2022-12-12 DIAGNOSIS — I959 Hypotension, unspecified: Secondary | ICD-10-CM | POA: Insufficient documentation

## 2022-12-12 DIAGNOSIS — D649 Anemia, unspecified: Secondary | ICD-10-CM | POA: Diagnosis not present

## 2022-12-12 DIAGNOSIS — I4430 Unspecified atrioventricular block: Secondary | ICD-10-CM | POA: Diagnosis not present

## 2022-12-12 DIAGNOSIS — E039 Hypothyroidism, unspecified: Secondary | ICD-10-CM | POA: Insufficient documentation

## 2022-12-12 DIAGNOSIS — R911 Solitary pulmonary nodule: Secondary | ICD-10-CM | POA: Diagnosis not present

## 2022-12-12 DIAGNOSIS — G9340 Encephalopathy, unspecified: Secondary | ICD-10-CM | POA: Diagnosis not present

## 2022-12-12 DIAGNOSIS — E1169 Type 2 diabetes mellitus with other specified complication: Secondary | ICD-10-CM | POA: Diagnosis not present

## 2022-12-12 DIAGNOSIS — R531 Weakness: Secondary | ICD-10-CM | POA: Diagnosis not present

## 2022-12-12 DIAGNOSIS — Z8673 Personal history of transient ischemic attack (TIA), and cerebral infarction without residual deficits: Secondary | ICD-10-CM | POA: Diagnosis not present

## 2022-12-12 DIAGNOSIS — E119 Type 2 diabetes mellitus without complications: Secondary | ICD-10-CM | POA: Insufficient documentation

## 2022-12-12 DIAGNOSIS — R918 Other nonspecific abnormal finding of lung field: Secondary | ICD-10-CM | POA: Diagnosis not present

## 2022-12-12 DIAGNOSIS — R001 Bradycardia, unspecified: Secondary | ICD-10-CM | POA: Diagnosis not present

## 2022-12-12 DIAGNOSIS — I4510 Unspecified right bundle-branch block: Secondary | ICD-10-CM | POA: Diagnosis not present

## 2022-12-12 DIAGNOSIS — G4733 Obstructive sleep apnea (adult) (pediatric): Secondary | ICD-10-CM | POA: Diagnosis not present

## 2022-12-12 DIAGNOSIS — U071 COVID-19: Secondary | ICD-10-CM | POA: Diagnosis not present

## 2022-12-12 DIAGNOSIS — R42 Dizziness and giddiness: Secondary | ICD-10-CM | POA: Diagnosis not present

## 2022-12-12 HISTORY — DX: Pneumonitis due to inhalation of food and vomit: J69.0

## 2022-12-12 LAB — URINALYSIS, W/ REFLEX TO CULTURE (INFECTION SUSPECTED)
Bacteria, UA: NONE SEEN
Bilirubin Urine: NEGATIVE
Glucose, UA: NEGATIVE mg/dL
Hgb urine dipstick: NEGATIVE
Ketones, ur: NEGATIVE mg/dL
Leukocytes,Ua: NEGATIVE
Nitrite: NEGATIVE
Protein, ur: NEGATIVE mg/dL
Specific Gravity, Urine: 1.006 (ref 1.005–1.030)
pH: 8 (ref 5.0–8.0)

## 2022-12-12 LAB — RESPIRATORY PANEL BY PCR

## 2022-12-12 LAB — COMPREHENSIVE METABOLIC PANEL
ALT: 19 U/L (ref 0–44)
AST: 27 U/L (ref 15–41)
Albumin: 2.7 g/dL — ABNORMAL LOW (ref 3.5–5.0)
Alkaline Phosphatase: 125 U/L (ref 38–126)
Anion gap: 12 (ref 5–15)
BUN: 27 mg/dL — ABNORMAL HIGH (ref 8–23)
CO2: 25 mmol/L (ref 22–32)
Calcium: 8.8 mg/dL — ABNORMAL LOW (ref 8.9–10.3)
Chloride: 100 mmol/L (ref 98–111)
Creatinine, Ser: 0.91 mg/dL (ref 0.61–1.24)
GFR, Estimated: >60 mL/min (ref >60)
Glucose, Bld: 95 mg/dL (ref 70–99)
Potassium: 4.9 mmol/L (ref 3.5–5.1)
Sodium: 137 mmol/L (ref 135–145)
Total Bilirubin: 0.2 mg/dL — ABNORMAL LOW (ref 0.3–1.2)
Total Protein: 6.9 g/dL (ref 6.5–8.1)

## 2022-12-12 LAB — CBC WITH DIFFERENTIAL/PLATELET
Abs Immature Granulocytes: 0 10*3/uL (ref 0.00–0.07)
Basophils Absolute: 0 10*3/uL (ref 0.0–0.1)
Basophils Relative: 0 %
Eosinophils Absolute: 0.1 10*3/uL (ref 0.0–0.5)
Eosinophils Relative: 2 %
HCT: 31.8 % — ABNORMAL LOW (ref 39.0–52.0)
Hemoglobin: 10.2 g/dL — ABNORMAL LOW (ref 13.0–17.0)
Immature Granulocytes: 0 %
Lymphocytes Relative: 52 %
Lymphs Abs: 1.4 10*3/uL (ref 0.7–4.0)
MCH: 31.6 pg (ref 26.0–34.0)
MCHC: 32.1 g/dL (ref 30.0–36.0)
MCV: 98.5 fL (ref 80.0–100.0)
Monocytes Absolute: 0.2 10*3/uL (ref 0.1–1.0)
Monocytes Relative: 7 %
Neutro Abs: 1 10*3/uL — ABNORMAL LOW (ref 1.7–7.7)
Neutrophils Relative %: 39 %
Platelets: 400 10*3/uL (ref 150–400)
RBC: 3.23 MIL/uL — ABNORMAL LOW (ref 4.22–5.81)
RDW: 17.2 % — ABNORMAL HIGH (ref 11.5–15.5)
WBC: 2.6 10*3/uL — ABNORMAL LOW (ref 4.0–10.5)
nRBC: 0 % (ref 0.0–0.2)

## 2022-12-12 LAB — I-STAT VENOUS BLOOD GAS, ED
Acid-Base Excess: 4 mmol/L — ABNORMAL HIGH (ref 0.0–2.0)
Bicarbonate: 27 mmol/L (ref 20.0–28.0)
Calcium, Ion: 1.07 mmol/L — ABNORMAL LOW (ref 1.15–1.40)
HCT: 31 % — ABNORMAL LOW (ref 39.0–52.0)
Hemoglobin: 10.5 g/dL — ABNORMAL LOW (ref 13.0–17.0)
O2 Saturation: 92 %
Potassium: 4.9 mmol/L (ref 3.5–5.1)
Sodium: 137 mmol/L (ref 135–145)
TCO2: 28 mmol/L (ref 22–32)
pCO2, Ven: 35.9 mmHg — ABNORMAL LOW (ref 44–60)
pH, Ven: 7.485 — ABNORMAL HIGH (ref 7.25–7.43)
pO2, Ven: 58 mmHg — ABNORMAL HIGH (ref 32–45)

## 2022-12-12 LAB — I-STAT CHEM 8, ED
BUN: 31 mg/dL — ABNORMAL HIGH (ref 8–23)
Calcium, Ion: 1.06 mmol/L — ABNORMAL LOW (ref 1.15–1.40)
Chloride: 104 mmol/L (ref 98–111)
Creatinine, Ser: 0.9 mg/dL (ref 0.61–1.24)
Glucose, Bld: 97 mg/dL (ref 70–99)
HCT: 31 % — ABNORMAL LOW (ref 39.0–52.0)
Hemoglobin: 10.5 g/dL — ABNORMAL LOW (ref 13.0–17.0)
Potassium: 4.9 mmol/L (ref 3.5–5.1)
Sodium: 137 mmol/L (ref 135–145)
TCO2: 25 mmol/L (ref 22–32)

## 2022-12-12 LAB — ETHANOL: Alcohol, Ethyl (B): <10 mg/dL (ref <10)

## 2022-12-12 LAB — CBG MONITORING, ED: Glucose-Capillary: 85 mg/dL (ref 70–99)

## 2022-12-12 LAB — PROCALCITONIN: Procalcitonin: <0.1 ng/mL

## 2022-12-12 LAB — RAPID URINE DRUG SCREEN, HOSP PERFORMED
Amphetamines: NOT DETECTED
Barbiturates: NOT DETECTED
Benzodiazepines: NOT DETECTED
Cocaine: NOT DETECTED
Opiates: NOT DETECTED
Tetrahydrocannabinol: NOT DETECTED

## 2022-12-12 LAB — GLUCOSE, CAPILLARY
Glucose-Capillary: 84 mg/dL (ref 70–99)
Glucose-Capillary: 89 mg/dL (ref 70–99)
Glucose-Capillary: 93 mg/dL (ref 70–99)

## 2022-12-12 LAB — TROPONIN I (HIGH SENSITIVITY)
Troponin I (High Sensitivity): 6 ng/L (ref <18)
Troponin I (High Sensitivity): 8 ng/L (ref <18)

## 2022-12-12 LAB — LACTIC ACID, PLASMA
Lactic Acid, Venous: 1.4 mmol/L (ref 0.5–1.9)
Lactic Acid, Venous: 1.2 mmol/L (ref 0.5–1.9)

## 2022-12-12 LAB — T4, FREE: Free T4: 0.95 ng/dL (ref 0.61–1.12)

## 2022-12-12 LAB — MAGNESIUM: Magnesium: 2.4 mg/dL (ref 1.7–2.4)

## 2022-12-12 LAB — I-STAT CG4 LACTIC ACID, ED
Lactic Acid, Venous: 1.9 mmol/L (ref 0.5–1.9)
Lactic Acid, Venous: 1.4 mmol/L (ref 0.5–1.9)

## 2022-12-12 LAB — TSH: TSH: 3.826 u[IU]/mL (ref 0.350–4.500)

## 2022-12-12 MED ORDER — ONDANSETRON HCL 4 MG/2ML IJ SOLN
4.0000 mg | Freq: Four times a day (QID) | INTRAMUSCULAR | Status: DC | PRN
  Administered 2022-12-12: 4 mg via INTRAVENOUS
  Filled 2022-12-12: qty 2

## 2022-12-12 MED ORDER — OSMOLITE 1.2 CAL PO LIQD
1000.0000 mL | ORAL | Status: DC
  Administered 2022-12-12: 1000 mL

## 2022-12-12 MED ORDER — INSULIN ASPART 100 UNIT/ML IJ SOLN: 0.0000 [IU] | INTRAMUSCULAR | Status: DC

## 2022-12-12 MED ORDER — PANTOPRAZOLE SODIUM 40 MG PO TBEC
40.0000 mg | DELAYED_RELEASE_TABLET | Freq: Every day | ORAL | Status: DC
  Administered 2022-12-13: 40 mg via ORAL
  Filled 2022-12-12: qty 1

## 2022-12-12 MED ORDER — ACETAMINOPHEN 650 MG RE SUPP: 650.0000 mg | Freq: Four times a day (QID) | RECTAL | Status: DC | PRN

## 2022-12-12 MED ORDER — ACETAMINOPHEN 325 MG PO TABS: 650.0000 mg | ORAL_TABLET | Freq: Four times a day (QID) | ORAL | Status: DC | PRN

## 2022-12-12 MED ORDER — LEVOTHYROXINE SODIUM 50 MCG PO TABS
50.0000 ug | ORAL_TABLET | Freq: Every day | ORAL | Status: DC
  Administered 2022-12-13: 50 ug
  Filled 2022-12-12: qty 1

## 2022-12-12 MED ORDER — ENOXAPARIN SODIUM 40 MG/0.4ML IJ SOSY
40.0000 mg | PREFILLED_SYRINGE | INTRAMUSCULAR | Status: DC
  Administered 2022-12-12: 40 mg via SUBCUTANEOUS
  Filled 2022-12-12: qty 0.4

## 2022-12-12 MED ORDER — MIDODRINE HCL 5 MG PO TABS
5.0000 mg | ORAL_TABLET | Freq: Three times a day (TID) | ORAL | Status: DC
  Administered 2022-12-12 – 2022-12-13 (×2): 5 mg
  Filled 2022-12-12 (×2): qty 1

## 2022-12-12 MED ORDER — IOHEXOL 350 MG/ML SOLN
50.0000 mL | Freq: Once | INTRAVENOUS | Status: AC | PRN
  Administered 2022-12-12: 50 mL via INTRAVENOUS

## 2022-12-12 MED ORDER — SODIUM CHLORIDE 0.9% FLUSH
3.0000 mL | Freq: Two times a day (BID) | INTRAVENOUS | Status: DC
  Administered 2022-12-12 – 2022-12-13 (×2): 3 mL via INTRAVENOUS

## 2022-12-12 NOTE — ED Notes (Signed)
EDP at bedside  

## 2022-12-12 NOTE — ED Notes (Signed)
Pt was brought back from CT

## 2022-12-12 NOTE — ED Notes (Signed)
ED TO INPATIENT HANDOFF REPORT  ED Nurse Name and Phone #: [phone]  S Name/Age/Gender Daniel Bailey. 72 y.o. male Room/Bed: 031C/031C  Code Status   Code Status: DNR  Home/SNF/Other Skilled nursing facility Patient oriented to: self Is this baseline? No , pt usually able to know where he is and self  Triage Complete: Triage complete  Chief Complaint Acute encephalopathy [G93.40]  Triage Note Pt bib [facility] from [facility] where they noticed him to be lethargic and have increased ams this am. Pt complained of dizziness and on EMS arrival pt was found to have a heart rate of 45. EMS gave 1mg  Atropine en route and pt denies dizziness now. Pt has moaning and shaking which facility says is baseline.    Allergies No Known Allergies  Level of Care/Admitting Diagnosis ED Disposition     ED Disposition  Admit   Condition  --   Comment  Hospital Area: [facility] [[id number]]  Level of Care: Progressive [102]  Admit to Progressive based on following criteria: MULTISYSTEM THREATS such as stable sepsis, metabolic/electrolyte imbalance with or without encephalopathy that is responding to early treatment.  May place patient in observation at [facility] or [facility] if equivalent level of care is available:: No  Covid Evaluation: Confirmed COVID Positive  Diagnosis: Acute encephalopathy [440347]  Admitting Physician: Synetta Fail [[id number]]  Attending Physician: Synetta Fail [[id number]]          B Medical/Surgery History Past Medical History:  Diagnosis Date   Acute encephalopathy 09/02/2021   Anemia    Aspiration pneumonia (HCC)    Colon cancer screening 08/18/2011   Elevated PSA    Frequent urination    Glaucoma    History of COVID-19    Hyperglycemia    Hyperlipidemia    Hypertension    Sleep apnea    Past Surgical History:  Procedure Laterality Date   BIOPSY  04/20/2022   Procedure: BIOPSY;  Surgeon: Beverley Fiedler, MD;  Location:  [facility] ENDOSCOPY;  Service: Gastroenterology;;   COLONOSCOPY     ESOPHAGOGASTRODUODENOSCOPY (EGD) WITH PROPOFOL N/A 04/20/2022   Procedure: ESOPHAGOGASTRODUODENOSCOPY (EGD) WITH PROPOFOL;  Surgeon: Beverley Fiedler, MD;  Location: [facility] ENDOSCOPY;  Service: Gastroenterology;  Laterality: N/A;   IR GASTROSTOMY TUBE MOD SED  11/10/2021   PROSTATE BIOPSY N/A 11/20/2017   Procedure: BIOPSY TRANSRECTAL ULTRASONIC PROSTATE (TUBP);  Surgeon: Riki Altes, MD;  Location: [facility] ORS;  Service: Urology;  Laterality: N/A;     A IV Location/Drains/Wounds Patient Lines/Drains/Airways Status     Active Line/Drains/Airways     Name Placement date Placement time Site Days   Peripheral IV 12/12/22 24 G Anterior;Left Forearm 12/12/22  1140  Forearm  less than 1   Peripheral IV 12/12/22 20 G 1.88" Anterior;Left;Proximal Forearm 12/12/22  1141  Forearm  less than 1   Peripheral IV 12/12/22 20 G 1.88" Anterior;Proximal;Right Forearm 12/12/22  1155  Forearm  less than 1   Urethral Catheter Reola Mosher, EMT Double-lumen;Temperature probe 16 Fr. 12/12/22  1144  Double-lumen;Temperature probe  less than 1            Intake/Output Last 24 hours No intake or output data in the 24 hours ending 12/12/22 1530  Labs/Imaging Results for orders placed or performed during the hospital encounter of 12/12/22 (from the past 48 hour(s))  CBG monitoring, ED     Status: None   Collection Time: 12/12/22 11:11 AM  Result Value Ref Range  Glucose-Capillary 85 70 - 99 mg/dL    Comment: Glucose reference range applies only to samples taken after fasting for at least 8 hours.  I-stat chem 8, ED (not at [facility], [facility] or [facility])     Status: Abnormal   Collection Time: 12/12/22 11:38 AM  Result Value Ref Range   Sodium 137 135 - 145 mmol/L   Potassium 4.9 3.5 - 5.1 mmol/L   Chloride 104 98 - 111 mmol/L   BUN 31 (H) 8 - 23 mg/dL   Creatinine, Ser 0.96 0.61 - 1.24 mg/dL   Glucose, Bld 97 70 - 99 mg/dL    Comment: Glucose reference range  applies only to samples taken after fasting for at least 8 hours.   Calcium, Ion 1.06 (L) 1.15 - 1.40 mmol/L   TCO2 25 22 - 32 mmol/L   Hemoglobin 10.5 (L) 13.0 - 17.0 g/dL   HCT 04.5 (L) 40.9 - 81.1 %  I-Stat venous blood gas, ([facility] ED, [facility], [facility])     Status: Abnormal   Collection Time: 12/12/22 11:39 AM  Result Value Ref Range   pH, Ven 7.485 (H) 7.25 - 7.43   pCO2, Ven 35.9 (L) 44 - 60 mmHg   pO2, Ven 58 (H) 32 - 45 mmHg   Bicarbonate 27.0 20.0 - 28.0 mmol/L   TCO2 28 22 - 32 mmol/L   O2 Saturation 92 %   Acid-Base Excess 4.0 (H) 0.0 - 2.0 mmol/L   Sodium 137 135 - 145 mmol/L   Potassium 4.9 3.5 - 5.1 mmol/L   Calcium, Ion 1.07 (L) 1.15 - 1.40 mmol/L   HCT 31.0 (L) 39.0 - 52.0 %   Hemoglobin 10.5 (L) 13.0 - 17.0 g/dL   Sample type VENOUS   CBC with Differential     Status: Abnormal   Collection Time: 12/12/22 11:42 AM  Result Value Ref Range   WBC 2.6 (L) 4.0 - 10.5 K/uL   RBC 3.23 (L) 4.22 - 5.81 MIL/uL   Hemoglobin 10.2 (L) 13.0 - 17.0 g/dL   HCT 91.4 (L) 78.2 - 95.6 %   MCV 98.5 80.0 - 100.0 fL   MCH 31.6 26.0 - 34.0 pg   MCHC 32.1 30.0 - 36.0 g/dL   RDW 21.3 (H) 08.6 - 57.8 %   Platelets 400 150 - 400 K/uL   nRBC 0.0 0.0 - 0.2 %   Neutrophils Relative % 39 %   Neutro Abs 1.0 (L) 1.7 - 7.7 K/uL   Lymphocytes Relative 52 %   Lymphs Abs 1.4 0.7 - 4.0 K/uL   Monocytes Relative 7 %   Monocytes Absolute 0.2 0.1 - 1.0 K/uL   Eosinophils Relative 2 %   Eosinophils Absolute 0.1 0.0 - 0.5 K/uL   Basophils Relative 0 %   Basophils Absolute 0.0 0.0 - 0.1 K/uL   Immature Granulocytes 0 %   Abs Immature Granulocytes 0.00 0.00 - 0.07 K/uL    Comment: Performed at [facility], [address] [place], [place] [address]  Comprehensive metabolic panel     Status: Abnormal   Collection Time: 12/12/22 11:42 AM  Result Value Ref Range   Sodium 137 135 - 145 mmol/L   Potassium 4.9 3.5 - 5.1 mmol/L   Chloride 100 98 - 111 mmol/L   CO2 25 22 - 32 mmol/L   Glucose, Bld 95 70 -  99 mg/dL    Comment: Glucose reference range applies only to samples taken after fasting for at least 8 hours.  BUN 27 (H) 8 - 23 mg/dL   Creatinine, Ser 1.61 0.61 - 1.24 mg/dL   Calcium 8.8 (L) 8.9 - 10.3 mg/dL   Total Protein 6.9 6.5 - 8.1 g/dL   Albumin 2.7 (L) 3.5 - 5.0 g/dL   AST 27 15 - 41 U/L   ALT 19 0 - 44 U/L   Alkaline Phosphatase 125 38 - 126 U/L   Total Bilirubin 0.2 (L) 0.3 - 1.2 mg/dL   GFR, Estimated >09 >60 mL/min    Comment: (NOTE) Calculated using the CKD-EPI Creatinine Equation (2021)    Anion gap 12 5 - 15    Comment: Performed at [facility], [address], [place], [place] [address]  Magnesium     Status: None   Collection Time: 12/12/22 11:42 AM  Result Value Ref Range   Magnesium 2.4 1.7 - 2.4 mg/dL    Comment: Performed at [facility], [address], [place], [place] [address]  Troponin I (High Sensitivity)     Status: None   Collection Time: 12/12/22 11:42 AM  Result Value Ref Range   Troponin I (High Sensitivity) 6 <18 ng/L    Comment: (NOTE) Elevated high sensitivity troponin I (hsTnI) values and significant  changes across serial measurements may suggest ACS but many other  chronic and acute conditions are known to elevate hsTnI results.  Refer to the "Links" section for chest pain algorithms and additional  guidance. Performed at [facility], [address] [place], [place] [address]   Urinalysis, w/ Reflex to Culture (Infection Suspected) -Urine, Clean Catch     Status: Abnormal   Collection Time: 12/12/22 11:42 AM  Result Value Ref Range   Specimen Source URINE, CLEAN CATCH    Color, Urine STRAW (A) YELLOW   APPearance CLEAR CLEAR   Specific Gravity, Urine 1.006 1.005 - 1.030   pH 8.0 5.0 - 8.0   Glucose, UA NEGATIVE NEGATIVE mg/dL   Hgb urine dipstick NEGATIVE NEGATIVE   Bilirubin Urine NEGATIVE NEGATIVE   Ketones, ur NEGATIVE NEGATIVE mg/dL   Protein, ur NEGATIVE NEGATIVE mg/dL   Nitrite NEGATIVE NEGATIVE    Leukocytes,Ua NEGATIVE NEGATIVE   RBC / HPF 0-5 0 - 5 RBC/hpf   WBC, UA 0-5 0 - 5 WBC/hpf    Comment:        Reflex urine culture not performed if WBC <=10, OR if Squamous epithelial cells >5. If Squamous epithelial cells >5 suggest recollection.    Bacteria, UA NONE SEEN NONE SEEN   Squamous Epithelial / HPF 0-5 0 - 5 /HPF    Comment: Performed at [facility], [address] [place], [place] [address]  TSH     Status: None   Collection Time: 12/12/22 11:42 AM  Result Value Ref Range   TSH 3.826 0.350 - 4.500 uIU/mL    Comment: Performed by a 3rd Generation assay with a functional sensitivity of <=0.01 uIU/mL. Performed at [facility], [address], [place], [place] [address]   T4, free     Status: None   Collection Time: 12/12/22 11:42 AM  Result Value Ref Range   Free T4 0.95 0.61 - 1.12 ng/dL    Comment: (NOTE) Biotin ingestion may interfere with free T4 tests. If the results are inconsistent with the TSH level, previous test results, or the clinical presentation, then consider biotin interference. If needed, order repeat testing after stopping biotin. Performed at [facility], [address], [place], [place] [address]   Ethanol     Status: None   Collection Time: 12/12/22 11:42 AM  Result Value Ref Range   Alcohol, Ethyl (B) <10 <10 mg/dL    Comment: (NOTE) Lowest detectable limit for serum alcohol is 10 mg/dL.  For medical purposes only. Performed at [facility], [address] [place], [place] [address]   Rapid urine drug screen (hospital performed)     Status: None   Collection Time: 12/12/22 11:43 AM  Result Value Ref Range   Opiates NONE DETECTED NONE DETECTED   Cocaine NONE DETECTED NONE DETECTED   Benzodiazepines NONE DETECTED NONE DETECTED   Amphetamines NONE DETECTED NONE DETECTED   Tetrahydrocannabinol NONE DETECTED NONE DETECTED   Barbiturates NONE DETECTED NONE DETECTED    Comment: (NOTE) DRUG SCREEN FOR MEDICAL  PURPOSES ONLY.  IF CONFIRMATION IS NEEDED FOR ANY PURPOSE, NOTIFY LAB WITHIN 5 DAYS.  LOWEST DETECTABLE LIMITS FOR URINE DRUG SCREEN Drug Class                     Cutoff (ng/mL) Amphetamine and metabolites    1000 Barbiturate and metabolites    200 Benzodiazepine                 200 Opiates and metabolites        300 Cocaine and metabolites        300 THC                            50 Performed at [facility], [address], [place], [place] [address]   I-Stat CG4 Lactic Acid, ED     Status: None   Collection Time: 12/12/22 12:05 PM  Result Value Ref Range   Lactic Acid, Venous 1.9 0.5 - 1.9 mmol/L  Lactic acid, plasma     Status: None   Collection Time: 12/12/22  1:44 PM  Result Value Ref Range   Lactic Acid, Venous 1.4 0.5 - 1.9 mmol/L    Comment: Performed at [facility], [address] [place], [place] [address]  Troponin I (High Sensitivity)     Status: None   Collection Time: 12/12/22  1:44 PM  Result Value Ref Range   Troponin I (High Sensitivity) 8 <18 ng/L    Comment: (NOTE) Elevated high sensitivity troponin I (hsTnI) values and significant  changes across serial measurements may suggest ACS but many other  chronic and acute conditions are known to elevate hsTnI results.  Refer to the "Links" section for chest pain algorithms and additional  guidance. Performed at [facility], [address], [place], [place] [address]   I-Stat CG4 Lactic Acid     Status: None   Collection Time: 12/12/22  2:03 PM  Result Value Ref Range   Lactic Acid, Venous 1.4 0.5 - 1.9 mmol/L   DG Chest Portable 1 View  Result Date: 12/12/2022 CLINICAL DATA:  Altered mental status. EXAM: PORTABLE CHEST 1 VIEW COMPARISON:  April 19, 2022. FINDINGS: Stable cardiomediastinal silhouette. Minimal right basilar subsegmental atelectasis is noted. New nodular density is noted in left upper lobe. Bony thorax is unremarkable. IMPRESSION: New nodular density seen in left upper  lobe. CT scan of the chest is recommended for further evaluation. Electronically Signed   By: Lupita Raider M.D.   On: 12/12/2022 12:38    Pending Labs Unresulted Labs (From admission, onward)  Start     Ordered   12/19/22 0500  Creatinine, serum  (enoxaparin (LOVENOX)    CrCl >/= 30 ml/min)  Weekly,   R     Comments: while on enoxaparin therapy    12/12/22 1523   12/13/22 0500  Comprehensive metabolic panel  Tomorrow morning,   R        12/12/22 1523   12/13/22 0500  CBC  Tomorrow morning,   R        12/12/22 1523   12/12/22 1524  Procalcitonin  Daily,   R     References:    Procalcitonin Lower Respiratory Tract Infection AND Sepsis Procalcitonin Algorithm   12/12/22 1523   12/12/22 1202  Lactic acid, plasma  (Lactic Acid)  Now then every 2 hours,   R      12/12/22 1201   12/12/22 1122  Blood culture (routine x 2)  BLOOD CULTURE X 2,   R      12/12/22 1121            Vitals/Pain Today's Vitals   12/12/22 1415 12/12/22 1445 12/12/22 1500 12/12/22 1515  BP: (!) 92/54 108/61  (!) 103/49  Pulse: (!) 59 (!) 59 64 68  Resp: (!) 21 15 (!) 22 (!) 23  Temp: (!) 94.6 F (34.8 C) (!) 95.2 F (35.1 C) (!) 95.5 F (35.3 C) (!) 95.8 F (35.4 C)  TempSrc:      SpO2: 98% 97% 97% 96%    Isolation Precautions No active isolations  Medications Medications  midodrine (PROAMATINE) tablet 5 mg (has no administration in time range)  levothyroxine (SYNTHROID) tablet 50 mcg (has no administration in time range)  pantoprazole (PROTONIX) EC tablet 40 mg (has no administration in time range)  feeding supplement (OSMOLITE 1.2 CAL) liquid 1,000 mL (has no administration in time range)  enoxaparin (LOVENOX) injection 40 mg (has no administration in time range)  sodium chloride flush (NS) 0.9 % injection 3 mL (has no administration in time range)  acetaminophen (TYLENOL) tablet 650 mg (has no administration in time range)    Or  acetaminophen (TYLENOL) suppository 650 mg (has no  administration in time range)    Mobility power wheelchair     Focused Assessments Cardiac Assessment Handoff:  Cardiac Rhythm: Normal sinus rhythm No results found for: "CKTOTAL", "CKMB", "CKMBINDEX", "TROPONINI" No results found for: "DDIMER" Does the Patient currently have chest pain? No   Neuro : patient baseline alert  R Recommendations: See Admitting Provider Note  Report given to:   Additional Notes:

## 2022-12-12 NOTE — H&P (Addendum)
History and Physical   Daniel Bailey. ZOX:[id number] DOB: 1950/10/20 DOA: 12/12/2022  PCP: [name], MD   Patient coming from: [facility]  Chief Complaint: Altered mental status  HPI: Daniel Bailey. is a 72 y.o. male with medical history significant of intellectual disability, hypertension, hyperlipidemia, GERD, diabetes, obesity, OSA, GI bleed, gastritis, tremor, anemia, recurrent aspiration status post G-tube presenting with altered mental status.  History obtained with assistance of chart review and family due to patient's altered mental status and baseline significant intellectual disability. Staff noticed change in his mental status and increased lethargy today.  Patient also reported dizziness to them.  EMS was called and patient noted to have heart rate around 45 and he received 2 mg of atropine.  He was subsequently transported to the ED for further evaluation.  He has been accompanied by his relative in the ED who is also his power of attorney.  States that he is getting closer to his baseline but is still not acting his usual self. She reports the shaking and moaning has been new for the past 4 days or so and is similar to how he was acting preceding a previous admission a year ago.  Patient was diagnosed with COVID on 7/23 and was treated with antiviral medication and his symptoms resolved.  Appropriately did receive a dose of ceftriaxone yesterday in the setting of having COVID but for unclear reason.  Unable to obtain full/accurate review of systems due to patient's baseline intellectual disability and altered mental status.   ED Course: Vital signs in the ED notable for initial temperature of 92.1 improved to 95.5 with warmer (reportedly has chronic issues with hypothermia) also noted to have blood pressure in the 100 systolic with 1 single reading in the 80s, heart rate in the 50s to 60s.  While workup included CMP with BUN 27, calcium 8.8, albumin 2.7.  Magnesium normal.   CBC with leukopenia 2.6, hemoglobin stable 10.2.  Troponin negative x 2.  Lactic acid normal x 3 with a fourth pending.  Urinalysis normal.  TSH, T4 normal.  Ethanol level negative.  UDS negative.  Blood cultures pending.  VBG with mildly elevated P pH with 7.48, mildly low pCO2 at 35.9.  Chest x-ray with new nodular density at the left upper lobe recommending CT to follow-up.  CT head pending.  Patient placed on warming blanket in the ED.  Review of Systems: As per HPI otherwise all other systems reviewed and are negative.  Past Medical History:  Diagnosis Date   Acute encephalopathy 09/02/2021   Anemia    Aspiration pneumonia (HCC)    Colon cancer screening 08/18/2011   Elevated PSA    Frequent urination    Glaucoma    History of COVID-19    Hyperglycemia    Hyperlipidemia    Hypertension    Sleep apnea     Past Surgical History:  Procedure Laterality Date   BIOPSY  04/20/2022   Procedure: BIOPSY;  Surgeon: Beverley Fiedler, MD;  Location: [facility] ENDOSCOPY;  Service: Gastroenterology;;   COLONOSCOPY     ESOPHAGOGASTRODUODENOSCOPY (EGD) WITH PROPOFOL N/A 04/20/2022   Procedure: ESOPHAGOGASTRODUODENOSCOPY (EGD) WITH PROPOFOL;  Surgeon: Beverley Fiedler, MD;  Location: [facility] ENDOSCOPY;  Service: Gastroenterology;  Laterality: N/A;   IR GASTROSTOMY TUBE MOD SED  11/10/2021   PROSTATE BIOPSY N/A 11/20/2017   Procedure: BIOPSY TRANSRECTAL ULTRASONIC PROSTATE (TUBP);  Surgeon: Riki Altes, MD;  Location: [facility] ORS;  Service: Urology;  Laterality: N/A;  Social History  reports that he has never smoked. He has never used smokeless tobacco. He reports that he does not drink alcohol and does not use drugs.  No Known Allergies  Family History  Problem Relation Age of Onset   Colon cancer Mother    Kidney disease Mother    Cancer Father        unknown  Reviewed on admission  Prior to Admission medications   Medication Sig Start Date End Date Taking? Authorizing Provider  acetaminophen  (TYLENOL) 500 MG tablet Place 2 tablets (1,000 mg total) into feeding tube every 8 (eight) hours as needed for moderate pain. 11/11/21  Yes Burnadette Pop, MD  cefTRIAXone (ROCEPHIN) IVPB 1 g See admin instructions. Intramuscularly once post covid   Yes [provider]  ferrous sulfate 220 (44 Fe) MG/5ML solution Place 7 mLs into feeding tube daily.   Yes [provider]  levothyroxine (SYNTHROID) 50 MCG tablet Take 50 mcg by mouth daily.   Yes [provider]  loratadine (CLARITIN) 10 MG tablet Take 10 mg by mouth daily.   Yes [provider]  magnesium hydroxide (MILK OF MAGNESIA) 400 MG/5ML suspension Take 30 mLs by mouth 2 (two) times daily as needed for moderate constipation.   Yes [provider]  midodrine (PROAMATINE) 5 MG tablet Place 1 tablet (5 mg total) into feeding tube 3 (three) times daily with meals. Patient taking differently: Place 2.5 mg into feeding tube daily. For orthostatic hypotension 11/11/21  Yes Burnadette Pop, MD  Nutritional Supplements (FEEDING SUPPLEMENT, JEVITY 1.5 CAL/FIBER,) LIQD Place 237 mLs into feeding tube 5 (five) times daily.   Yes [provider]  omeprazole (PRILOSEC) 20 MG capsule 20 mg See admin instructions. 20 mg via gtube twice daily   Yes [provider]  ondansetron (ZOFRAN-ODT) 4 MG disintegrating tablet 4 mg See admin instructions. 4 mg via g tube three times daily as needed for nausea and vomiting 04/19/22  Yes [provider]  Water For Irrigation, Sterile (FREE WATER) SOLN Place 225 mLs into feeding tube every 6 (six) hours.   Yes [provider]  levothyroxine (SYNTHROID) 25 MCG tablet Place 25 mcg into feeding tube daily.    [provider]  molnupiravir EUA (LAGEVRIO) 200 MG CAPS capsule Take 4 capsules by mouth 2 (two) times daily.    [provider]  sodium chloride 0.9 % infusion Inject 1,000 mLs into the vein See admin instructions. Administer 1  Liter at 125 ml/hr. Stop after 1 Liter.    [provider]    Physical Exam: Vitals:   12/12/22 1415 12/12/22 1445 12/12/22 1500 12/12/22 1515  BP: (!) 92/54 108/61  (!) 103/49  Pulse: (!) 59 (!) 59 64 68  Resp: (!) 21 15 (!) 22 (!) 23  Temp: (!) 94.6 F (34.8 C) (!) 95.2 F (35.1 C) (!) 95.5 F (35.3 C) (!) 95.8 F (35.4 C)  TempSrc:      SpO2: 98% 97% 97% 96%    Physical Exam Constitutional:      General: He is not in acute distress.    Appearance: Normal appearance.  HENT:     Head: Normocephalic and atraumatic.     Mouth/Throat:     Mouth: Mucous membranes are moist.     Pharynx: Oropharynx is clear.  Eyes:     Extraocular Movements: Extraocular movements intact.     Pupils: Pupils are equal, round, and reactive to light.  Cardiovascular:  Rate and Rhythm: Normal rate and regular rhythm.     Pulses: Normal pulses.     Heart sounds: Normal heart sounds.  Pulmonary:     Effort: Pulmonary effort is normal. No respiratory distress.     Breath sounds: Normal breath sounds.  Abdominal:     General: Bowel sounds are normal. There is no distension.     Palpations: Abdomen is soft.     Tenderness: There is no abdominal tenderness.  Musculoskeletal:        General: No swelling or deformity.  Skin:    General: Skin is warm and dry.  Neurological:     General: No focal deficit present.     Mental Status: Mental status is at baseline.     Comments: Tremor present.  Able to speak and respond to questions send situations appropriately but does have known significant intellectual disability.    Labs on Admission: I have personally reviewed following labs and imaging studies  CBC: Recent Labs  Lab 12/12/22 1138 12/12/22 1139 12/12/22 1142  WBC  --   --  2.6*  NEUTROABS  --   --  1.0*  HGB 10.5* 10.5* 10.2*  HCT 31.0* 31.0* 31.8*  MCV  --   --  98.5  PLT  --   --  400    Basic Metabolic Panel: Recent Labs  Lab 12/12/22 1138 12/12/22 1139  12/12/22 1142  NA 137 137 137  K 4.9 4.9 4.9  CL 104  --  100  CO2  --   --  25  GLUCOSE 97  --  95  BUN 31*  --  27*  CREATININE 0.90  --  0.91  CALCIUM  --   --  8.8*  MG  --   --  2.4    GFR: CrCl cannot be calculated (Unknown ideal weight.).  Liver Function Tests: Recent Labs  Lab 12/12/22 1142  AST 27  ALT 19  ALKPHOS 125  BILITOT 0.2*  PROT 6.9  ALBUMIN 2.7*    Urine analysis:    Component Value Date/Time   COLORURINE STRAW (A) 12/12/2022 1142   APPEARANCEUR CLEAR 12/12/2022 1142   LABSPEC 1.006 12/12/2022 1142   PHURINE 8.0 12/12/2022 1142   GLUCOSEU NEGATIVE 12/12/2022 1142   HGBUR NEGATIVE 12/12/2022 1142   BILIRUBINUR NEGATIVE 12/12/2022 1142   BILIRUBINUR neg 08/10/2021 1616   KETONESUR NEGATIVE 12/12/2022 1142   PROTEINUR NEGATIVE 12/12/2022 1142   UROBILINOGEN 0.2 08/10/2021 1616   NITRITE NEGATIVE 12/12/2022 1142   LEUKOCYTESUR NEGATIVE 12/12/2022 1142    Radiological Exams on Admission: DG Chest Portable 1 View  Result Date: 12/12/2022 CLINICAL DATA:  Altered mental status. EXAM: PORTABLE CHEST 1 VIEW COMPARISON:  April 19, 2022. FINDINGS: Stable cardiomediastinal silhouette. Minimal right basilar subsegmental atelectasis is noted. New nodular density is noted in left upper lobe. Bony thorax is unremarkable. IMPRESSION: New nodular density seen in left upper lobe. CT scan of the chest is recommended for further evaluation. Electronically Signed   By: Lupita Raider M.D.   On: 12/12/2022 12:38    EKG: Independently reviewed.  Sinus rhythm at 68 bpm.  Right bundle branch block.  QRS 43.  Similar to previous.  Assessment/Plan Active Problems:   Intellectual disability   Iron deficiency anemia   Obesity   HYPERTENSION, BENIGN ESSENTIAL   Controlled type 2 diabetes mellitus without complication, without long-term current use of insulin (HCC)   Mixed hyperlipidemia due to type 2 diabetes mellitus (HCC)  OSA (obstructive sleep apnea)   GERD  (gastroesophageal reflux disease)   Acute encephalopathy   Encephalopathy Intellectual disability > Noted to have lethargy and being altered off his baseline facility today.  Found to have heart rate in the 40s by EMS and received a dose of atropine.  Has remained off baseline in ED. > Baseline does include shaking/tremor and periodic moaning in the setting of his known intellectual disability as well. > Unclear etiology.  Broad workup in the ED was negative for bacterial infectious etiology with mild leukopenia, chest x-ray with new nodular density, recommending CT follow-up which will be done, normal urinalysis.  CT head is still pending.  No obvious significant electrolyte abnormality.  Hemoglobin stable.  Troponin and lactic acid normal.  TSH, T4 normal.  Negative ethanol level and UDS.  Blood cultures are pending. > We will follow-up findings on chest x-ray with CT chest.  Will also follow-up CT head results.  Did recently have infection with COVID-19 evidence of bacterial superinfection. - Monitor on progressive unit overnight - Follow-up CT head - CT chest - Respiratory viral panel (holding off on testing for COVID as I would expect this to be positive just 2 weeks from initial diagnosis) for possible viral etiology of patient's CXR findings and symptoms. - Follow-up blood cultures - Check procalcitonin to further rule out bacterial superinfection - Supportive care  Recurrent aspiration > Status post tube placement. - Continue with tube feeds  Hypertension Hypotension > Now appears to be primarily orthostatic hypotension, on midodrine. - Continue midodrine per tube  Hyperlipidemia - Do not see any medication for this  History of GI bleed Gastritis GERD - PPI  Diabetes - SSI  OSA - Not on CPAP per chart  Anemia > Hemoglobin stable at 10.2. - Trend CBC  DVT prophylaxis: Lovenox Code Status:   DNR/DNI Family Communication:  Updated at bedside  Disposition Plan:    Patient is from:  [facility] DC to:  Same as above  Anticipated DC date:  1 to 2 days  Anticipated DC barriers: None  Consults called:  None Admission status:  Observation, progressive  Severity of Illness: The appropriate patient status for this patient is OBSERVATION. Observation status is judged to be reasonable and necessary in order to provide the required intensity of service to ensure the patient's safety. The patient's presenting symptoms, physical exam findings, and initial radiographic and laboratory data in the context of their medical condition is felt to place them at decreased risk for further clinical deterioration. Furthermore, it is anticipated that the patient will be medically stable for discharge from the hospital within 2 midnights of admission.    Synetta Fail MD [facility]  How to contact the [facility] Attending or Consulting provider 7A - 7P or covering provider during after hours 7P -7A, for this patient?   Check the care team in [facility] and look for a) attending/consulting [facility] provider listed and b) the [facility] team listed Log into [email or web address] and use [place]'s universal password to access. If you do not have the password, please contact the hospital operator. Locate the [facility] provider you are looking for under [facility] and page to a number that you can be directly reached. If you still have difficulty reaching the provider, please page the [facility] (Director on Call) for the Hospitalists listed on amion for assistance.  12/12/2022, 3:27 PM

## 2022-12-12 NOTE — ED Provider Notes (Signed)
[place] EMERGENCY DEPARTMENT AT [facility] Provider Note   CSN: [id number] Arrival date & time: 12/12/22  1102     History  Chief Complaint  Patient presents with   Altered Mental Status    Daniel Bailey. is a 72 y.o. male with PMH as listed below, pertinent h/o ID lives at facility, who presents [facility] from [facility] where they noticed him to be lethargic and have increased AMS this am. Pt complained of dizziness and on EMS arrival pt was found to have a heart rate of 45. EMS gave 1mg  Atropine en route and pt denies dizziness now. Pt has moaning and shaking which facility says is baseline.  Cousin and healthcare power of attorney at bedside states that patient is not acting normally, she states that he is very fatigued and falling asleep a lot, also having some rocking of the head which is abnormal for him.  She states he had COVID 2 weeks ago and was given something for COVID but his symptoms resolved.  She states that the facility told her that he was started on antibiotics yesterday but she is not sure why and for an unknown reason.  She states that it is normal to not be able to obtain a history from patient and states that he will typically just agree with what ever you say, see you cannot rely on him for his own symptoms.  She states that he has had a problem for the last year with having a low temperature as well.  Past Medical History:  Diagnosis Date   Acute encephalopathy 09/02/2021   Anemia    Aspiration pneumonia (HCC)    Colon cancer screening 08/18/2011   Elevated PSA    Frequent urination    Glaucoma    History of COVID-19    Hyperglycemia    Hyperlipidemia    Hypertension    Sleep apnea        Home Medications Prior to Admission medications   Medication Sig Start Date End Date Taking? Authorizing Provider  acetaminophen (TYLENOL) 500 MG tablet Place 2 tablets (1,000 mg total) into feeding tube every 8 (eight) hours as needed for moderate pain.  11/11/21  Yes Burnadette Pop, MD  cefTRIAXone (ROCEPHIN) IVPB 1 g See admin instructions. Intramuscularly once post covid   Yes [provider]  ferrous sulfate 220 (44 Fe) MG/5ML solution Place 7 mLs into feeding tube daily.   Yes [provider]  levothyroxine (SYNTHROID) 50 MCG tablet Take 50 mcg by mouth daily.   Yes [provider]  loratadine (CLARITIN) 10 MG tablet Take 10 mg by mouth daily.   Yes [provider]  magnesium hydroxide (MILK OF MAGNESIA) 400 MG/5ML suspension Take 30 mLs by mouth 2 (two) times daily as needed for moderate constipation.   Yes [provider]  midodrine (PROAMATINE) 5 MG tablet Place 1 tablet (5 mg total) into feeding tube 3 (three) times daily with meals. Patient taking differently: Place 2.5 mg into feeding tube daily. For orthostatic hypotension 11/11/21  Yes Burnadette Pop, MD  Nutritional Supplements (FEEDING SUPPLEMENT, JEVITY 1.5 CAL/FIBER,) LIQD Place 237 mLs into feeding tube 5 (five) times daily.   Yes [provider]  omeprazole (PRILOSEC) 20 MG capsule 20 mg See admin instructions. 20 mg via gtube twice daily   Yes [provider]  ondansetron (ZOFRAN-ODT) 4 MG disintegrating tablet 4 mg See admin instructions. 4 mg via g tube three times daily as needed for nausea and  vomiting 04/19/22  Yes [provider]  Water For Irrigation, Sterile (FREE WATER) SOLN Place 225 mLs into feeding tube every 6 (six) hours.   Yes [provider]  molnupiravir EUA (LAGEVRIO) 200 MG CAPS capsule Take 4 capsules by mouth 2 (two) times daily.    [provider]  sodium chloride 0.9 % infusion Inject 1,000 mLs into the vein See admin instructions. Administer 1 Liter at 125 ml/hr. Stop after 1 Liter.    [provider]      Allergies    Patient has no known allergies.    Review of Systems   Review of Systems A 10 point review of systems was performed and is negative unless  otherwise reported in HPI.  Physical Exam Updated Vital Signs BP (!) 103/49   Pulse 68   Temp (!) 95.8 F (35.4 C)   Resp (!) 23   SpO2 96%  Physical Exam General: Normal appearing male, lying in bed.  HEENT: PERRLA, EOMI, Sclera anicteric, MMM, trachea midline. Tongue protrudes midline. Clear oropharynx. Cardiology: RRR, no murmurs/rubs/gallops. BL radial and DP pulses equal bilaterally.  Resp: Normal respiratory rate and effort. CTAB, no wheezes, rhonchi, crackles.  Abd: Soft, non-tender, non-distended. No rebound tenderness or guarding.  GU: Deferred. MSK: No peripheral edema or signs of trauma. Extremities without deformity or TTP. No cyanosis or clubbing. Skin: warm, dry. No rashes or lesions. Back: No sacral wounds Neuro: A&Ox1, answers questions but not reliably, intermittently moaning with nodding/head shaking movement that cousin states is abnormal. CNs II-XII grossly intact. MAEs. Sensation grossly intact.   ED Results / Procedures / Treatments   Labs (all labs ordered are listed, but only abnormal results are displayed) Labs Reviewed  CBC WITH DIFFERENTIAL/PLATELET - Abnormal; Notable for the following components:      Result Value   WBC 2.6 (*)    RBC 3.23 (*)    Hemoglobin 10.2 (*)    HCT 31.8 (*)    RDW 17.2 (*)    Neutro Abs 1.0 (*)    All other components within normal limits  COMPREHENSIVE METABOLIC PANEL - Abnormal; Notable for the following components:   BUN 27 (*)    Calcium 8.8 (*)    Albumin 2.7 (*)    Total Bilirubin 0.2 (*)    All other components within normal limits  URINALYSIS, W/ REFLEX TO CULTURE (INFECTION SUSPECTED) - Abnormal; Notable for the following components:   Color, Urine STRAW (*)    All other components within normal limits  I-STAT VENOUS BLOOD GAS, ED - Abnormal; Notable for the following components:   pH, Ven 7.485 (*)    pCO2, Ven 35.9 (*)    pO2, Ven 58 (*)    Acid-Base Excess 4.0 (*)    Calcium, Ion 1.07 (*)    HCT 31.0  (*)    Hemoglobin 10.5 (*)    All other components within normal limits  I-STAT CHEM 8, ED - Abnormal; Notable for the following components:   BUN 31 (*)    Calcium, Ion 1.06 (*)    Hemoglobin 10.5 (*)    HCT 31.0 (*)    All other components within normal limits  CULTURE, BLOOD (ROUTINE X 2)  CULTURE, BLOOD (ROUTINE X 2)  MAGNESIUM  TSH  T4, FREE  ETHANOL  RAPID URINE DRUG SCREEN, HOSP PERFORMED  LACTIC ACID, PLASMA  LACTIC ACID, PLASMA  PROCALCITONIN  CBG MONITORING, ED  CBG MONITORING, ED  I-STAT CG4 LACTIC ACID, ED  I-STAT  CG4 LACTIC ACID, ED  I-STAT CG4 LACTIC ACID, ED  TROPONIN I (HIGH SENSITIVITY)  TROPONIN I (HIGH SENSITIVITY)    EKG EKG Interpretation Date/Time:  Tuesday December 12 2022 11:12:58 EDT Ventricular Rate:  68 PR Interval:  200 QRS Duration:  143 QT Interval:  455 QTC Calculation: 484 R Axis:   89  Text Interpretation: Sinus rhythm Right bundle branch block similar to prior EKGs Confirmed by Vivi Barrack [id number]) on 12/12/2022 11:25:08 AM  Radiology DG Chest Portable 1 View  Result Date: 12/12/2022 CLINICAL DATA:  Altered mental status. EXAM: PORTABLE CHEST 1 VIEW COMPARISON:  April 19, 2022. FINDINGS: Stable cardiomediastinal silhouette. Minimal right basilar subsegmental atelectasis is noted. New nodular density is noted in left upper lobe. Bony thorax is unremarkable. IMPRESSION: New nodular density seen in left upper lobe. CT scan of the chest is recommended for further evaluation. Electronically Signed   By: Lupita Raider M.D.   On: 12/12/2022 12:38    Procedures Procedures    Medications Ordered in ED Medications  midodrine (PROAMATINE) tablet 5 mg (has no administration in time range)  levothyroxine (SYNTHROID) tablet 50 mcg (has no administration in time range)  pantoprazole (PROTONIX) EC tablet 40 mg (has no administration in time range)  feeding supplement (OSMOLITE 1.2 CAL) liquid 1,000 mL (has no administration in time range)   enoxaparin (LOVENOX) injection 40 mg (has no administration in time range)  sodium chloride flush (NS) 0.9 % injection 3 mL (has no administration in time range)  acetaminophen (TYLENOL) tablet 650 mg (has no administration in time range)    Or  acetaminophen (TYLENOL) suppository 650 mg (has no administration in time range)    ED Course/ Medical Decision Making/ A&P                          Medical Decision Making Amount and/or Complexity of Data Reviewed Labs: ordered. Decision-making details documented in ED Course. Radiology: ordered. Decision-making details documented in ED Course.  Risk Decision regarding hospitalization.    This patient presents to the ED for concern of AMS, hypothermia, this involves an extensive number of treatment options, and is a complaint that carries with it a high risk of complications and morbidity.  I considered the following differential and admission for this acute, potentially life threatening condition.   MDM:    Ddx of acute altered mental status or encephalopathy considered but not limited to: -Intracranial abnormalities such as ICH, hydrocephalus, head trauma - no head trauma evident on exam, no reported falls. CTH NAICP. -Infection such as UTI, PNA - reportedly recently had covid, will get CXR as well - not hypoxic, tachypneic, tachycardic here. -Toxic ingestion such as opioid overdose, anticholinergic toxicity - no reported med overdoses or changes, will get UDS  -Electrolyte abnormalities or hyper/hypoglycemia - no significant electrolyte abnormalities or glucose abnormalities -Hypercarbia or hypoxia - no hypercarbia on VBG -ACS or arrhythmia - EKG w/o arrhythmia or signs of ischemia, troponin negative as well. Reportedly had bradycardia at facility, but none here, will CTM w/ telemetry. -Endocrine abnormality such as myxedema coma - thyroid studies wnl -Hypothermia - pt hypothermic to 92.53F on arrival, started on bair hugger   Clinical  Course as of 12/12/22 1524  Tue Dec 12, 2022  1120 Rectal temp 92.8 F. Started on Lawyer. Ordered labs/blood cultures. [HN]  1149 Glucose-Capillary: 85 [HN]  1233 Rapid urine drug screen (hospital performed) neg [HN]  1233 Lactic Acid, Venous: 1.9  neg [HN]  1233 Alcohol, Ethyl (B): <10 neg [HN]  1400 Urinalysis, w/ Reflex to Culture (Infection Suspected) -Urine, Clean Catch(!) Neg for UTI [HN]  1400 Troponin I (High Sensitivity): 6 neg [HN]  1400 T4,Free(Direct): 0.95 wnl [HN]  1411 CT Head Wo Contrast NAICP [HN]  1417 TSH: 3.826 wnl [HN]    Clinical Course User Index [HN] Loetta Rough, MD    Labs: I Ordered, and personally interpreted labs.  The pertinent results include:  those listed above  Imaging Studies ordered: I ordered imaging studies including CTH, CXR I independently visualized and interpreted imaging. I agree with the radiologist interpretation  Additional history obtained from chart review, HCPOA at bedside.    Cardiac Monitoring: The patient was maintained on a cardiac monitor.  I personally viewed and interpreted the cardiac monitored which showed an underlying rhythm of: NSR  Reevaluation: After the interventions noted above, I reevaluated the patient and found that they have :stayed the same  Social Determinants of Health: Lives at facility  Disposition:  Unclear cause of AMS. HCPOA at bedside still reporting patient is not at his baseline. Difficult also as patient cannot tell us his symptoms or provide history. Will admit to hospitalist for further w/u and management.   Co morbidities that complicate the patient evaluation  Past Medical History:  Diagnosis Date   Acute encephalopathy 09/02/2021   Anemia    Aspiration pneumonia (HCC)    Colon cancer screening 08/18/2011   Elevated PSA    Frequent urination    Glaucoma    History of COVID-19    Hyperglycemia    Hyperlipidemia    Hypertension    Sleep apnea      Medicines Meds  ordered this encounter  Medications   midodrine (PROAMATINE) tablet 5 mg    Patient taking differently: For orthostatic hypotension     levothyroxine (SYNTHROID) tablet 50 mcg   pantoprazole (PROTONIX) EC tablet 40 mg   feeding supplement (OSMOLITE 1.2 CAL) liquid 1,000 mL   enoxaparin (LOVENOX) injection 40 mg   sodium chloride flush (NS) 0.9 % injection 3 mL   OR Linked Order Group    acetaminophen (TYLENOL) tablet 650 mg    acetaminophen (TYLENOL) suppository 650 mg    I have reviewed the patients home medicines and have made adjustments as needed  Problem List / ED Course: Problem List Items Addressed This Visit   None Visit Diagnoses     Altered mental status, unspecified altered mental status type    -  Primary   Fatigue, unspecified type                       This note was created using dictation software, which may contain spelling or grammatical errors.    Loetta Rough, MD 12/12/22 [phone]

## 2022-12-12 NOTE — ED Triage Notes (Signed)
Pt bib [facility] from [facility] where they noticed him to be lethargic and have increased ams this am. Pt complained of dizziness and on EMS arrival pt was found to have a heart rate of 45. EMS gave 1mg  Atropine en route and pt denies dizziness now. Pt has moaning and shaking which facility says is baseline.

## 2022-12-12 NOTE — ED Notes (Signed)
Patient transported to CT 

## 2022-12-13 DIAGNOSIS — Z7401 Bed confinement status: Secondary | ICD-10-CM | POA: Diagnosis not present

## 2022-12-13 DIAGNOSIS — G9340 Encephalopathy, unspecified: Secondary | ICD-10-CM | POA: Diagnosis not present

## 2022-12-13 DIAGNOSIS — F70 Mild intellectual disabilities: Secondary | ICD-10-CM | POA: Diagnosis not present

## 2022-12-13 DIAGNOSIS — R4182 Altered mental status, unspecified: Secondary | ICD-10-CM | POA: Diagnosis not present

## 2022-12-13 LAB — GLUCOSE, CAPILLARY
Glucose-Capillary: 124 mg/dL — ABNORMAL HIGH (ref 70–99)
Glucose-Capillary: 104 mg/dL — ABNORMAL HIGH (ref 70–99)

## 2022-12-13 MED ORDER — AMOXICILLIN-POT CLAVULANATE 400-57 MG/5ML PO SUSR
400.0000 mg | Freq: Two times a day (BID) | ORAL | Status: DC
  Administered 2022-12-13: 400 mg
  Filled 2022-12-13 (×2): qty 5

## 2022-12-13 MED ORDER — AMOXICILLIN-POT CLAVULANATE 400-57 MG/5ML PO SUSR: 400.0000 mg | Freq: Two times a day (BID) | ORAL | Status: AC

## 2022-12-13 NOTE — TOC Transition Note (Signed)
Transition of Care [facility]) - CM/SW Discharge Note   Patient Details  Name: Daniel Bailey. MRN: [id number] Date of Birth: Dec 09, 1950  [facility] CM/SW Contact:  Baldemar Lenis, LCSW Phone Number: 12/13/2022, 11:39 AM   Clinical Narrative:   Patient from [facility] LTC, CSW confirmed can return today. CSW sent discharge information, confirmed bed is ready. CSW left voicemail for legal guardian, Nicholaus Bloom, to inform of return to SNF. Transport arranged with [facility] for next available.  Nurse to call report to [phone], Room 305B.    Final next level of care: Skilled Nursing Facility Barriers to Discharge: Barriers Resolved   Patient Goals and CMS Choice CMS Medicare.gov Compare Post Acute Care list provided to:: Patient Represenative (must comment) Choice offered to / list presented to : [facility] POA / Guardian  Discharge Placement                Patient chooses bed at: [facility] Patient to be transferred to facility by: [facility] Name of family member notified: Nicholaus Bloom Patient and family notified of of transfer: 12/13/22  Discharge Plan and Services Additional resources added to the After Visit Summary for                                       Social Determinants of Health (SDOH) Interventions SDOH Screenings   Food Insecurity: No Food Insecurity (12/12/2022)  Housing: Low Risk  (12/12/2022)  Transportation Needs: No Transportation Needs (12/12/2022)  Utilities: Not At Risk (12/12/2022)  Alcohol Screen: Low Risk  (03/21/2022)  Depression (PHQ2-9): Low Risk  (03/21/2022)  Financial Resource Strain: Low Risk  (03/21/2022)  Physical Activity: Insufficiently Active (03/21/2022)  Social Connections: Socially Isolated (03/21/2022)  Stress: No Stress Concern Present (03/21/2022)  Tobacco Use: Low Risk  (12/12/2022)     Readmission Risk Interventions     No data to display

## 2022-12-13 NOTE — [place] FL2 (Signed)
[place] MEDICAID FL2 LEVEL OF CARE FORM     IDENTIFICATION  Patient Name: Daniel Bailey. Birthdate: 02-23-1951 Sex: male Admission Date (Current Location): 12/12/2022  [facility] and [place] Number:  Producer, television/film/video and Address:  The . [facility], [address] [place], [place] [address]      Provider Number: [id number]  Attending Physician Name and Address:  Burnadette Pop, MD  Relative Name and Phone Number:       Current Level of Care: Hospital Recommended Level of Care: Skilled Nursing Facility Prior Approval Number:    Date Approved/Denied:   PASRR Number:    Discharge Plan: SNF    Current Diagnoses: Patient Active Problem List   Diagnosis Date Noted   Acute encephalopathy 12/12/2022   Coffee ground emesis 04/20/2022   Gastritis and gastroduodenitis 04/20/2022   Upper GI bleed 04/20/2022   Acute upper GI bleed 04/19/2022   Elevated alkaline phosphatase level 04/19/2022   GERD (gastroesophageal reflux disease) 09/02/2021   Normal anion gap metabolic acidosis 09/02/2021   Hypoalbuminemia 09/02/2021   Right bundle branch block 08/12/2021   Right ventricular hypertrophy 08/09/2021   Bradycardia 08/09/2021   Urinary frequency 08/09/2021   OSA (obstructive sleep apnea) 07/16/2021   Hypothermia 07/15/2021   Lactic acidosis 07/13/2021   Acute metabolic encephalopathy 07/13/2021   AKI (acute kidney injury) (HCC)    Hypotension    Decreased GFR 05/26/2021   Iron deficiency anemia 05/26/2021   Cerumen impaction 05/17/2021   Frequent urination 05/17/2021   Tremor 02/09/2021   Generalized weakness 02/09/2021   Elevated PSA 09/05/2017   Leukocytopenia 09/01/2016   Mixed hyperlipidemia due to type 2 diabetes mellitus (HCC) 02/19/2014   Encounter for Medicare annual wellness exam 08/27/2012   Obesity 08/18/2011   Prostate cancer screening 08/10/2011   Controlled type 2 diabetes mellitus without complication, without long-term current use of  insulin (HCC) 05/30/2010   HYPERTENSION, BENIGN ESSENTIAL 12/10/2006   Intellectual disability 11/23/2006    Orientation RESPIRATION BLADDER Height & Weight     Self, Place  Normal Incontinent Weight: 180 lb (81.6 kg) Height:  5\' 6"  (167.6 cm)  BEHAVIORAL SYMPTOMS/MOOD NEUROLOGICAL BOWEL NUTRITION STATUS      Incontinent Feeding tube (Osmolite 1.2)  AMBULATORY STATUS COMMUNICATION OF NEEDS Skin   Extensive Assist Verbally Normal                       Personal Care Assistance Level of Assistance  Bathing, Feeding, Dressing Bathing Assistance: Maximum assistance Feeding assistance: Maximum assistance Dressing Assistance: Maximum assistance     Functional Limitations Info             SPECIAL CARE FACTORS FREQUENCY                       Contractures Contractures Info: Not present    Additional Factors Info  Code Status, Allergies Code Status Info: DNR Allergies Info: NKA           Current Medications (12/13/2022):  This is the current hospital active medication list Current Facility-Administered Medications  Medication Dose Route Frequency Provider Last Rate Last Admin   acetaminophen (TYLENOL) tablet 650 mg  650 mg Per Tube Q6H PRN Synetta Fail, MD       Or   acetaminophen (TYLENOL) suppository 650 mg  650 mg Rectal Q6H PRN Synetta Fail, MD       amoxicillin-clavulanate (AUGMENTIN) 400-57 MG/5ML suspension 400 mg  400 mg Per Tube Q12H Adhikari, Amrit, MD       enoxaparin (LOVENOX) injection 40 mg  40 mg Subcutaneous Q24H Synetta Fail, MD   40 mg at 12/12/22 1644   feeding supplement (OSMOLITE 1.2 CAL) liquid 1,000 mL  1,000 mL Per Tube Continuous Synetta Fail, MD 50 mL/hr at 12/13/22 0744 Infusion Verify at 12/13/22 0744   insulin aspart (novoLOG) injection 0-6 Units  0-6 Units Subcutaneous Q4H Synetta Fail, MD       levothyroxine (SYNTHROID) tablet 50 mcg  50 mcg Per Tube Q0600 Synetta Fail, MD   50 mcg at  12/13/22 0626   midodrine (PROAMATINE) tablet 5 mg  5 mg Per Tube TID WC Synetta Fail, MD   5 mg at 12/13/22 0829   ondansetron [facility]) injection 4 mg  4 mg Intravenous Q6H PRN Synetta Fail, MD   4 mg at 12/12/22 1644   pantoprazole (PROTONIX) EC tablet 40 mg  40 mg Oral Daily Synetta Fail, MD   40 mg at 12/13/22 7829   sodium chloride flush (NS) 0.9 % injection 3 mL  3 mL Intravenous Q12H Synetta Fail, MD   3 mL at 12/13/22 5621     Discharge Medications: Please see discharge summary for a list of discharge medications.  Relevant Imaging Results:  Relevant Lab Results:   Additional Information SS#: [id number]  Baldemar Lenis, LCSW

## 2022-12-13 NOTE — Discharge Summary (Addendum)
Physician Discharge Summary  Daniel Bailey. ZOX:[id number] DOB: [birth year] DOA: 12/12/2022  PCP: [name], MD  Admit date: 12/12/2022 Discharge date: 12/13/2022  Admitted From: SNF Disposition:  SNF Discharge Condition:Stable CODE STATUS:DNR Diet recommendation: Tube diet  Brief/Interim Summary: Patient is a 72 y.o. male with medical history significant of intellectual disability, hypertension, hyperlipidemia, GERD, diabetes, obesity, OSA, GI bleed, gastritis, tremor, anemia, recurrent aspiration status post G-tube presenting with altered mental status. History obtained with assistance of chart review and family due to patient's altered mental status and baseline significant intellectual disability. Staff noticed change in his mental status and increased lethargy, also noted to be bradycardic.  He was recently had COVID and was treated with antiviral medications.  On presentation, he was hemodynamically stable.  CT head did not show any acute findings.  CT chest showed bilateral pulm nodules suspicious for infectious etiology or aspiration.  His mental status might have improved today, currently at baseline.  Not agitated.  He is on room air and currently looks very comfortable without any distress.  He has been started on Augmentin.  Patient is medically stable for discharge.  Following problems were addressed during the hospitalization:  Encephalopathy Intellectual disability  > Noted to have lethargy and being altered off his baseline facility today.  Found to have heart rate in the 40s by EMS and received a dose of atropine.   > Baseline does include shaking/tremor and periodic moaning in the setting of his known intellectual disability as well. >  Broad workup in the ED was negative for bacterial infectious etiology. No obvious significant electrolyte abnormality.  Hemoglobin stable.  Troponin and lactic acid normal.  TSH, T4 normal.  Negative ethanol level and UDS > Did recently have  infection with COVID-19 .  CT head did not show any acute findings.  CT chest showed bilateral pulm nodules suspicious for infectious or aspiration pressures but patient is not coughing, on room air, not in any acute respite distress.  He has been started on Augmentin.  We recommend to do a follow-up CT chest in 3 to 6 months. -- Respiratory viral panel negative, reassuring procalcitonin level -He is back to his baseline mentation.  He is confused at baseline   Recurrent aspiration > Status post tube placement. - Continue with tube feeds.  Started on antibiotic   Chronic hypotension: On midodrine  Hypothyroidism: On levothyroxine     History of GI bleed Gastritis GERD - On PPI.  Continue iron supplementation    OSA - Not on CPAP per chart   Anemia > Hemoglobin stable   Urine retention: Foley was placed, now will be removed before discharge.  If he retains urine again, Foley can be replaced in the nursing facility  Discharge Diagnoses:  Active Problems:   Intellectual disability   Iron deficiency anemia   Obesity   HYPERTENSION, BENIGN ESSENTIAL   Controlled type 2 diabetes mellitus without complication, without long-term current use of insulin (HCC)   Mixed hyperlipidemia due to type 2 diabetes mellitus (HCC)   OSA (obstructive sleep apnea)   GERD (gastroesophageal reflux disease)   Acute encephalopathy    Discharge Instructions  Discharge Instructions     Diet general   Complete by: As directed    Tube diet   Discharge instructions   Complete by: As directed    1)Please take prescribed medication as instructed 2)Follow up with your PCP in a week 3)Do a non-contrast chest CT at 3-6 months for follow up of  lung nodules   Increase activity slowly   Complete by: As directed       Allergies as of 12/13/2022   No Known Allergies      Medication List     STOP taking these medications    cefTRIAXone IVPB Commonly known as: ROCEPHIN   molnupiravir EUA 200  MG Caps capsule Commonly known as: LAGEVRIO   sodium chloride 0.9 % infusion       TAKE these medications    acetaminophen 500 MG tablet Commonly known as: TYLENOL Place 2 tablets (1,000 mg total) into feeding tube every 8 (eight) hours as needed for moderate pain.   amoxicillin-clavulanate 400-57 MG/5ML suspension Commonly known as: AUGMENTIN Place 5 mLs (400 mg total) into feeding tube every 12 (twelve) hours for 5 days.   feeding supplement (JEVITY 1.5 CAL/FIBER) Liqd Place 237 mLs into feeding tube 5 (five) times daily.   ferrous sulfate 220 (44 Fe) MG/5ML solution Place 7 mLs into feeding tube daily.   free water Soln Place 225 mLs into feeding tube every 6 (six) hours.   levothyroxine 50 MCG tablet Commonly known as: SYNTHROID Take 50 mcg by mouth daily.   loratadine 10 MG tablet Commonly known as: CLARITIN Take 10 mg by mouth daily.   magnesium hydroxide 400 MG/5ML suspension Commonly known as: MILK OF MAGNESIA Take 30 mLs by mouth 2 (two) times daily as needed for moderate constipation.   midodrine 5 MG tablet Commonly known as: PROAMATINE Place 1 tablet (5 mg total) into feeding tube 3 (three) times daily with meals. What changed:  how much to take when to take this additional instructions   omeprazole 20 MG capsule Commonly known as: PRILOSEC 20 mg See admin instructions. 20 mg via gtube twice daily   ondansetron 4 MG disintegrating tablet Commonly known as: ZOFRAN-ODT 4 mg See admin instructions. 4 mg via g tube three times daily as needed for nausea and vomiting        Follow-up Information     Tower, Audrie Gallus, MD. Schedule an appointment as soon as possible for a visit in 1 week(s).   Specialties: Family Medicine, Radiology Contact information: [address] [place] [address] [phone]                No Known Allergies  Consultations: None   Procedures/Studies: CT CHEST W CONTRAST  Result Date:  12/12/2022 CLINICAL DATA:  Abnormal chest x-ray EXAM: CT CHEST WITH CONTRAST TECHNIQUE: Multidetector CT imaging of the chest was performed during intravenous contrast administration. RADIATION DOSE REDUCTION: This exam was performed according to the departmental dose-optimization program which includes automated exposure control, adjustment of the mA and/or kV according to patient size and/or use of iterative reconstruction technique. CONTRAST:  50mL OMNIPAQUE IOHEXOL 350 MG/ML SOLN COMPARISON:  Same day chest x-ray; CT chest, abdomen and pelvis dated July 12, 2021 FINDINGS: Cardiovascular: Normal heart size. Trace pericardial effusion. Normal caliber thoracic aorta with mild atherosclerotic disease. Mild coronary artery calcifications. Mediastinum/Nodes: Small hiatal hernia. Thyroid is unremarkable. No enlarged lymph nodes seen in the chest. Lungs/Pleura: Central airways are patent. Bilateral bronchial wall thickening, most severe in the bilateral lower lobes. New bilateral solid pulmonary nodules which are most pronounced in the bilateral lower lobes and posterior upper lobes. Largest is a solid nodule of the superior portion of the left lower lobe measuring 7 mm on series 3, image 49. Consolidation seen in the left lower lobe on prior exam has resolved. Upper Abdomen: Gallstones.  Low-attenuation hepatic lesions, unchanged when compared with the prior exam and likely simple cysts. No acute abnormality. Musculoskeletal: No chest wall abnormality. No acute or significant osseous findings. IMPRESSION: 1. Bilateral bronchial wall thickening and new bilateral solid pulmonary nodules, most pronounced in the bilateral lower lobes and posterior upper lobes, likely due to infection or aspiration. 2. Largest new solid nodule measures 7 mm. Non-contrast chest CT at 3-6 months is recommended. If the nodules are stable at time of repeat CT, then future CT at 18-24 months (from today's scan) is considered optional for low-risk  patients, but is recommended for high-risk patients. This recommendation follows the consensus statement: Guidelines for Management of Incidental Pulmonary Nodules Detected on CT Images: From the Fleischner Society 2017; Radiology 2017; 284:228-243. 3. Aortic Atherosclerosis (ICD10-I70.0). Electronically Signed   By: Allegra Lai M.D.   On: 12/12/2022 19:34   CT Head Wo Contrast  Result Date: 12/12/2022 CLINICAL DATA:  Mental status change, unknown cause EXAM: CT HEAD WITHOUT CONTRAST TECHNIQUE: Contiguous axial images were obtained from the base of the skull through the vertex without intravenous contrast. RADIATION DOSE REDUCTION: This exam was performed according to the departmental dose-optimization program which includes automated exposure control, adjustment of the mA and/or kV according to patient size and/or use of iterative reconstruction technique. COMPARISON:  CT head 09/02/2021 FINDINGS: Brain: No evidence of large-territorial acute infarction. No parenchymal hemorrhage. No mass lesion. No extra-axial collection. No mass effect or midline shift. No hydrocephalus. Basilar cisterns are patent. Vascular: No hyperdense vessel. Skull: No acute fracture or focal lesion. Sinuses/Orbits: Paranasal sinuses and mastoid air cells are clear. The orbits are unremarkable. Other: None. IMPRESSION: No acute intracranial abnormality. Electronically Signed   By: Tish Frederickson M.D.   On: 12/12/2022 13:36   DG Chest Portable 1 View  Result Date: 12/12/2022 CLINICAL DATA:  Altered mental status. EXAM: PORTABLE CHEST 1 VIEW COMPARISON:  April 19, 2022. FINDINGS: Stable cardiomediastinal silhouette. Minimal right basilar subsegmental atelectasis is noted. New nodular density is noted in left upper lobe. Bony thorax is unremarkable. IMPRESSION: New nodular density seen in left upper lobe. CT scan of the chest is recommended for further evaluation. Electronically Signed   By: Lupita Raider M.D.   On: 12/12/2022  12:38      Subjective: Patient seen and examined at bedside today.  Hemodynamically stable but comfortable.  On room air.  Noted any kind of distress.  Not agitated.  Confused at baseline.  I called and discussed with his cousin [name] in detail.  Medically stable for discharge  Discharge Exam: Vitals:   12/13/22 0341 12/13/22 0802  BP: 118/61 93/68  Pulse: (!) 55 (!) 58  Resp: 16 18  Temp: 98.9 F (37.2 C) (!) 97.5 F (36.4 C)  SpO2: 98% 100%   Vitals:   12/12/22 1946 12/12/22 2335 12/13/22 0341 12/13/22 0802  BP: (!) 105/49 100/69 118/61 93/68  Pulse: 66 60 (!) 55 (!) 58  Resp: 16 16 16 18   Temp: 97.6 F (36.4 C) 98 F (36.7 C) 98.9 F (37.2 C) (!) 97.5 F (36.4 C)  TempSrc: Oral   Oral  SpO2: 99% 99% 98% 100%  Weight:      Height:        General: Pt is alert, awake, not in acute distress Cardiovascular: RRR, S1/S2 +, no rubs, no gallops Respiratory: CTA bilaterally, no wheezing, no rhonchi Abdominal: Soft, NT, ND, bowel sounds +,PEG Extremities: no edema, no cyanosis    The results  of significant diagnostics from this hospitalization (including imaging, microbiology, ancillary and laboratory) are listed below for reference.     Microbiology: Recent Results (from the past 240 hour(s))  Blood culture (routine x 2)     Status: None (Preliminary result)   Collection Time: 12/12/22 11:43 AM   Specimen: BLOOD RIGHT ARM  Result Value Ref Range Status   Specimen Description BLOOD RIGHT ARM  Final   Special Requests   Final    BOTTLES DRAWN AEROBIC AND ANAEROBIC Blood Culture adequate volume   Culture   Final    NO GROWTH < 24 HOURS Performed at [facility], [address] [place], [place] [address]    Report Status PENDING  Incomplete  Blood culture (routine x 2)     Status: None (Preliminary result)   Collection Time: 12/12/22 11:43 AM   Specimen: BLOOD LEFT FOREARM  Result Value Ref Range Status   Specimen Description BLOOD LEFT FOREARM  Final    Special Requests   Final    BOTTLES DRAWN AEROBIC AND ANAEROBIC Blood Culture results may not be optimal due to an inadequate volume of blood received in culture bottles   Culture   Final    NO GROWTH < 24 HOURS Performed at [facility], [address] [place], [place] [address]    Report Status PENDING  Incomplete  Respiratory (~20 pathogens) panel by PCR     Status: None   Collection Time: 12/12/22  6:01 PM   Specimen: Nasopharyngeal Swab; Respiratory  Result Value Ref Range Status   Adenovirus NOT DETECTED NOT DETECTED Final   Coronavirus 229E NOT DETECTED NOT DETECTED Final    Comment: (NOTE) The Coronavirus on the Respiratory Panel, DOES NOT test for the novel  Coronavirus (2019 nCoV)    Coronavirus HKU1 NOT DETECTED NOT DETECTED Final   Coronavirus NL63 NOT DETECTED NOT DETECTED Final   Coronavirus OC43 NOT DETECTED NOT DETECTED Final   Metapneumovirus NOT DETECTED NOT DETECTED Final   Rhinovirus / Enterovirus NOT DETECTED NOT DETECTED Final   Influenza A NOT DETECTED NOT DETECTED Final   Influenza B NOT DETECTED NOT DETECTED Final   Parainfluenza Virus 1 NOT DETECTED NOT DETECTED Final   Parainfluenza Virus 2 NOT DETECTED NOT DETECTED Final   Parainfluenza Virus 3 NOT DETECTED NOT DETECTED Final   Parainfluenza Virus 4 NOT DETECTED NOT DETECTED Final   Respiratory Syncytial Virus NOT DETECTED NOT DETECTED Final   Bordetella pertussis NOT DETECTED NOT DETECTED Final   Bordetella Parapertussis NOT DETECTED NOT DETECTED Final   Chlamydophila pneumoniae NOT DETECTED NOT DETECTED Final   Mycoplasma pneumoniae NOT DETECTED NOT DETECTED Final    Comment: Performed at [facility], [address], [place], [place] [address]     Labs: BNP (last 3 results) No results for input(s): "BNP" in the last 8760 hours. Basic Metabolic Panel: Recent Labs  Lab 12/12/22 1138 12/12/22 1139 12/12/22 1142 12/13/22 0555  NA 137 137 137 136  K 4.9 4.9 4.9 4.6  CL 104  --   100 99  CO2  --   --  25 25  GLUCOSE 97  --  95 125*  BUN 31*  --  27* 31*  CREATININE 0.90  --  0.91 1.19  CALCIUM  --   --  8.8* 8.8*  MG  --   --  2.4  --    Liver Function Tests: Recent Labs  Lab 12/12/22 1142 12/13/22 0555  AST  27 23  ALT 19 18  ALKPHOS 125 130*  BILITOT 0.2* <0.1*  PROT 6.9 6.8  ALBUMIN 2.7* 2.7*   No results for input(s): "LIPASE", "AMYLASE" in the last 168 hours. No results for input(s): "AMMONIA" in the last 168 hours. CBC: Recent Labs  Lab 12/12/22 1138 12/12/22 1139 12/12/22 1142 12/13/22 0555  WBC  --   --  2.6* 3.5*  NEUTROABS  --   --  1.0*  --   HGB 10.5* 10.5* 10.2* 10.0*  HCT 31.0* 31.0* 31.8* 30.8*  MCV  --   --  98.5 100.0  PLT  --   --  400 409*   Cardiac Enzymes: No results for input(s): "CKTOTAL", "CKMB", "CKMBINDEX", "TROPONINI" in the last 168 hours. BNP: Invalid input(s): "POCBNP" CBG: Recent Labs  Lab 12/12/22 1807 12/12/22 1946 12/12/22 2332 12/13/22 0339 12/13/22 0746  GLUCAP 84 89 93 124* 104*   D-Dimer No results for input(s): "DDIMER" in the last 72 hours. Hgb A1c No results for input(s): "HGBA1C" in the last 72 hours. Lipid Profile No results for input(s): "CHOL", "HDL", "LDLCALC", "TRIG", "CHOLHDL", "LDLDIRECT" in the last 72 hours. Thyroid function studies Recent Labs    12/12/22 1142  TSH 3.826   Anemia work up No results for input(s): "VITAMINB12", "FOLATE", "FERRITIN", "TIBC", "IRON", "RETICCTPCT" in the last 72 hours. Urinalysis    Component Value Date/Time   COLORURINE STRAW (A) 12/12/2022 1142   APPEARANCEUR CLEAR 12/12/2022 1142   LABSPEC 1.006 12/12/2022 1142   PHURINE 8.0 12/12/2022 1142   GLUCOSEU NEGATIVE 12/12/2022 1142   HGBUR NEGATIVE 12/12/2022 1142   BILIRUBINUR NEGATIVE 12/12/2022 1142   BILIRUBINUR neg 08/10/2021 1616   KETONESUR NEGATIVE 12/12/2022 1142   PROTEINUR NEGATIVE 12/12/2022 1142   UROBILINOGEN 0.2 08/10/2021 1616   NITRITE NEGATIVE 12/12/2022 1142    LEUKOCYTESUR NEGATIVE 12/12/2022 1142   Sepsis Labs Recent Labs  Lab 12/12/22 1142 12/13/22 0555  WBC 2.6* 3.5*   Microbiology Recent Results (from the past 240 hour(s))  Blood culture (routine x 2)     Status: None (Preliminary result)   Collection Time: 12/12/22 11:43 AM   Specimen: BLOOD RIGHT ARM  Result Value Ref Range Status   Specimen Description BLOOD RIGHT ARM  Final   Special Requests   Final    BOTTLES DRAWN AEROBIC AND ANAEROBIC Blood Culture adequate volume   Culture   Final    NO GROWTH < 24 HOURS Performed at [facility], [address] [place], [place] [address]    Report Status PENDING  Incomplete  Blood culture (routine x 2)     Status: None (Preliminary result)   Collection Time: 12/12/22 11:43 AM   Specimen: BLOOD LEFT FOREARM  Result Value Ref Range Status   Specimen Description BLOOD LEFT FOREARM  Final   Special Requests   Final    BOTTLES DRAWN AEROBIC AND ANAEROBIC Blood Culture results may not be optimal due to an inadequate volume of blood received in culture bottles   Culture   Final    NO GROWTH < 24 HOURS Performed at [facility], [address], [place], [place] [address]    Report Status PENDING  Incomplete  Respiratory (~20 pathogens) panel by PCR     Status: None   Collection Time: 12/12/22  6:01 PM   Specimen: Nasopharyngeal Swab; Respiratory  Result Value Ref Range Status   Adenovirus NOT DETECTED NOT DETECTED Final   Coronavirus 229E NOT DETECTED NOT DETECTED Final  Comment: (NOTE) The Coronavirus on the Respiratory Panel, DOES NOT test for the novel  Coronavirus (2019 nCoV)    Coronavirus HKU1 NOT DETECTED NOT DETECTED Final   Coronavirus NL63 NOT DETECTED NOT DETECTED Final   Coronavirus OC43 NOT DETECTED NOT DETECTED Final   Metapneumovirus NOT DETECTED NOT DETECTED Final   Rhinovirus / Enterovirus NOT DETECTED NOT DETECTED Final   Influenza A NOT DETECTED NOT DETECTED Final   Influenza B NOT DETECTED NOT  DETECTED Final   Parainfluenza Virus 1 NOT DETECTED NOT DETECTED Final   Parainfluenza Virus 2 NOT DETECTED NOT DETECTED Final   Parainfluenza Virus 3 NOT DETECTED NOT DETECTED Final   Parainfluenza Virus 4 NOT DETECTED NOT DETECTED Final   Respiratory Syncytial Virus NOT DETECTED NOT DETECTED Final   Bordetella pertussis NOT DETECTED NOT DETECTED Final   Bordetella Parapertussis NOT DETECTED NOT DETECTED Final   Chlamydophila pneumoniae NOT DETECTED NOT DETECTED Final   Mycoplasma pneumoniae NOT DETECTED NOT DETECTED Final    Comment: Performed at [facility], [address] [place], [place] [address]    Please note: You were cared for by a hospitalist during your hospital stay. Once you are discharged, your primary care physician will handle any further medical issues. Please note that NO REFILLS for any discharge medications will be authorized once you are discharged, as it is imperative that you return to your primary care physician (or establish a relationship with a primary care physician if you do not have one) for your post hospital discharge needs so that they can reassess your need for medications and monitor your lab values.    Time coordinating discharge: 40 minutes  SIGNED:   Burnadette Pop, MD  [facility] 12/13/2022, 10:11 AM Pager [phone]  If 7PM-7AM, please contact night-coverage [email or web address] Password [email or web address]

## 2022-12-13 NOTE — Plan of Care (Signed)
  Problem: Education: Goal: Knowledge of General Education information will improve Description: Including pain rating scale, medication(s)/side effects and non-pharmacologic comfort measures Outcome: Adequate for Discharge   

## 2022-12-14 DIAGNOSIS — R4182 Altered mental status, unspecified: Secondary | ICD-10-CM | POA: Diagnosis not present

## 2022-12-14 DIAGNOSIS — E1122 Type 2 diabetes mellitus with diabetic chronic kidney disease: Secondary | ICD-10-CM | POA: Diagnosis not present

## 2022-12-14 DIAGNOSIS — J188 Other pneumonia, unspecified organism: Secondary | ICD-10-CM | POA: Diagnosis not present

## 2022-12-14 DIAGNOSIS — E038 Other specified hypothyroidism: Secondary | ICD-10-CM | POA: Diagnosis not present

## 2022-12-14 DIAGNOSIS — N182 Chronic kidney disease, stage 2 (mild): Secondary | ICD-10-CM | POA: Diagnosis not present

## 2022-12-14 DIAGNOSIS — D638 Anemia in other chronic diseases classified elsewhere: Secondary | ICD-10-CM | POA: Diagnosis not present

## 2022-12-14 DIAGNOSIS — K2100 Gastro-esophageal reflux disease with esophagitis, without bleeding: Secondary | ICD-10-CM | POA: Diagnosis not present

## 2022-12-14 DIAGNOSIS — R001 Bradycardia, unspecified: Secondary | ICD-10-CM | POA: Diagnosis not present

## 2022-12-14 DIAGNOSIS — M6281 Muscle weakness (generalized): Secondary | ICD-10-CM | POA: Diagnosis not present

## 2022-12-18 DIAGNOSIS — N182 Chronic kidney disease, stage 2 (mild): Secondary | ICD-10-CM | POA: Diagnosis not present

## 2022-12-18 DIAGNOSIS — R4182 Altered mental status, unspecified: Secondary | ICD-10-CM | POA: Diagnosis not present

## 2022-12-18 DIAGNOSIS — M6281 Muscle weakness (generalized): Secondary | ICD-10-CM | POA: Diagnosis not present

## 2022-12-18 DIAGNOSIS — J188 Other pneumonia, unspecified organism: Secondary | ICD-10-CM | POA: Diagnosis not present

## 2022-12-18 DIAGNOSIS — K2100 Gastro-esophageal reflux disease with esophagitis, without bleeding: Secondary | ICD-10-CM | POA: Diagnosis not present

## 2022-12-18 DIAGNOSIS — R001 Bradycardia, unspecified: Secondary | ICD-10-CM | POA: Diagnosis not present

## 2022-12-18 DIAGNOSIS — E038 Other specified hypothyroidism: Secondary | ICD-10-CM | POA: Diagnosis not present

## 2022-12-18 DIAGNOSIS — D638 Anemia in other chronic diseases classified elsewhere: Secondary | ICD-10-CM | POA: Diagnosis not present

## 2022-12-18 DIAGNOSIS — E1122 Type 2 diabetes mellitus with diabetic chronic kidney disease: Secondary | ICD-10-CM | POA: Diagnosis not present

## 2022-12-19 DIAGNOSIS — N182 Chronic kidney disease, stage 2 (mild): Secondary | ICD-10-CM | POA: Diagnosis not present

## 2022-12-19 DIAGNOSIS — E1122 Type 2 diabetes mellitus with diabetic chronic kidney disease: Secondary | ICD-10-CM | POA: Diagnosis not present

## 2022-12-19 DIAGNOSIS — R001 Bradycardia, unspecified: Secondary | ICD-10-CM | POA: Diagnosis not present

## 2022-12-19 DIAGNOSIS — M6281 Muscle weakness (generalized): Secondary | ICD-10-CM | POA: Diagnosis not present

## 2022-12-19 DIAGNOSIS — J188 Other pneumonia, unspecified organism: Secondary | ICD-10-CM | POA: Diagnosis not present

## 2022-12-19 DIAGNOSIS — D638 Anemia in other chronic diseases classified elsewhere: Secondary | ICD-10-CM | POA: Diagnosis not present

## 2022-12-19 DIAGNOSIS — R4182 Altered mental status, unspecified: Secondary | ICD-10-CM | POA: Diagnosis not present

## 2022-12-19 DIAGNOSIS — K2100 Gastro-esophageal reflux disease with esophagitis, without bleeding: Secondary | ICD-10-CM | POA: Diagnosis not present

## 2022-12-19 DIAGNOSIS — E038 Other specified hypothyroidism: Secondary | ICD-10-CM | POA: Diagnosis not present

## 2022-12-20 DIAGNOSIS — K2100 Gastro-esophageal reflux disease with esophagitis, without bleeding: Secondary | ICD-10-CM | POA: Diagnosis not present

## 2022-12-20 DIAGNOSIS — R4182 Altered mental status, unspecified: Secondary | ICD-10-CM | POA: Diagnosis not present

## 2022-12-20 DIAGNOSIS — N182 Chronic kidney disease, stage 2 (mild): Secondary | ICD-10-CM | POA: Diagnosis not present

## 2022-12-20 DIAGNOSIS — J188 Other pneumonia, unspecified organism: Secondary | ICD-10-CM | POA: Diagnosis not present

## 2022-12-20 DIAGNOSIS — M6281 Muscle weakness (generalized): Secondary | ICD-10-CM | POA: Diagnosis not present

## 2022-12-20 DIAGNOSIS — E1122 Type 2 diabetes mellitus with diabetic chronic kidney disease: Secondary | ICD-10-CM | POA: Diagnosis not present

## 2022-12-20 DIAGNOSIS — E038 Other specified hypothyroidism: Secondary | ICD-10-CM | POA: Diagnosis not present

## 2022-12-20 DIAGNOSIS — D638 Anemia in other chronic diseases classified elsewhere: Secondary | ICD-10-CM | POA: Diagnosis not present

## 2022-12-20 DIAGNOSIS — R001 Bradycardia, unspecified: Secondary | ICD-10-CM | POA: Diagnosis not present

## 2022-12-21 DIAGNOSIS — E038 Other specified hypothyroidism: Secondary | ICD-10-CM | POA: Diagnosis not present

## 2022-12-21 DIAGNOSIS — R001 Bradycardia, unspecified: Secondary | ICD-10-CM | POA: Diagnosis not present

## 2022-12-21 DIAGNOSIS — J188 Other pneumonia, unspecified organism: Secondary | ICD-10-CM | POA: Diagnosis not present

## 2022-12-21 DIAGNOSIS — D638 Anemia in other chronic diseases classified elsewhere: Secondary | ICD-10-CM | POA: Diagnosis not present

## 2022-12-21 DIAGNOSIS — M6281 Muscle weakness (generalized): Secondary | ICD-10-CM | POA: Diagnosis not present

## 2022-12-21 DIAGNOSIS — E1122 Type 2 diabetes mellitus with diabetic chronic kidney disease: Secondary | ICD-10-CM | POA: Diagnosis not present

## 2022-12-21 DIAGNOSIS — K2100 Gastro-esophageal reflux disease with esophagitis, without bleeding: Secondary | ICD-10-CM | POA: Diagnosis not present

## 2022-12-21 DIAGNOSIS — N182 Chronic kidney disease, stage 2 (mild): Secondary | ICD-10-CM | POA: Diagnosis not present

## 2022-12-22 DIAGNOSIS — R4182 Altered mental status, unspecified: Secondary | ICD-10-CM | POA: Diagnosis not present

## 2022-12-22 DIAGNOSIS — N182 Chronic kidney disease, stage 2 (mild): Secondary | ICD-10-CM | POA: Diagnosis not present

## 2022-12-22 DIAGNOSIS — J188 Other pneumonia, unspecified organism: Secondary | ICD-10-CM | POA: Diagnosis not present

## 2022-12-22 DIAGNOSIS — M6281 Muscle weakness (generalized): Secondary | ICD-10-CM | POA: Diagnosis not present

## 2022-12-22 DIAGNOSIS — E038 Other specified hypothyroidism: Secondary | ICD-10-CM | POA: Diagnosis not present

## 2022-12-22 DIAGNOSIS — K2100 Gastro-esophageal reflux disease with esophagitis, without bleeding: Secondary | ICD-10-CM | POA: Diagnosis not present

## 2022-12-22 DIAGNOSIS — R001 Bradycardia, unspecified: Secondary | ICD-10-CM | POA: Diagnosis not present

## 2022-12-22 DIAGNOSIS — E1122 Type 2 diabetes mellitus with diabetic chronic kidney disease: Secondary | ICD-10-CM | POA: Diagnosis not present

## 2022-12-22 DIAGNOSIS — D638 Anemia in other chronic diseases classified elsewhere: Secondary | ICD-10-CM | POA: Diagnosis not present

## 2022-12-26 DIAGNOSIS — E038 Other specified hypothyroidism: Secondary | ICD-10-CM | POA: Diagnosis not present

## 2022-12-26 DIAGNOSIS — R001 Bradycardia, unspecified: Secondary | ICD-10-CM | POA: Diagnosis not present

## 2022-12-26 DIAGNOSIS — N182 Chronic kidney disease, stage 2 (mild): Secondary | ICD-10-CM | POA: Diagnosis not present

## 2022-12-26 DIAGNOSIS — E1122 Type 2 diabetes mellitus with diabetic chronic kidney disease: Secondary | ICD-10-CM | POA: Diagnosis not present

## 2022-12-26 DIAGNOSIS — R4182 Altered mental status, unspecified: Secondary | ICD-10-CM | POA: Diagnosis not present

## 2022-12-26 DIAGNOSIS — D638 Anemia in other chronic diseases classified elsewhere: Secondary | ICD-10-CM | POA: Diagnosis not present

## 2022-12-26 DIAGNOSIS — K2100 Gastro-esophageal reflux disease with esophagitis, without bleeding: Secondary | ICD-10-CM | POA: Diagnosis not present

## 2022-12-26 DIAGNOSIS — M6281 Muscle weakness (generalized): Secondary | ICD-10-CM | POA: Diagnosis not present

## 2022-12-26 DIAGNOSIS — J188 Other pneumonia, unspecified organism: Secondary | ICD-10-CM | POA: Diagnosis not present

## 2022-12-29 DIAGNOSIS — H25813 Combined forms of age-related cataract, bilateral: Secondary | ICD-10-CM | POA: Diagnosis not present

## 2023-01-02 DIAGNOSIS — E1122 Type 2 diabetes mellitus with diabetic chronic kidney disease: Secondary | ICD-10-CM | POA: Diagnosis not present

## 2023-01-02 DIAGNOSIS — D508 Other iron deficiency anemias: Secondary | ICD-10-CM | POA: Diagnosis not present

## 2023-01-03 DIAGNOSIS — E1122 Type 2 diabetes mellitus with diabetic chronic kidney disease: Secondary | ICD-10-CM | POA: Diagnosis not present

## 2023-01-03 DIAGNOSIS — D638 Anemia in other chronic diseases classified elsewhere: Secondary | ICD-10-CM | POA: Diagnosis not present

## 2023-01-03 DIAGNOSIS — J188 Other pneumonia, unspecified organism: Secondary | ICD-10-CM | POA: Diagnosis not present

## 2023-01-03 DIAGNOSIS — K2100 Gastro-esophageal reflux disease with esophagitis, without bleeding: Secondary | ICD-10-CM | POA: Diagnosis not present

## 2023-01-03 DIAGNOSIS — M6281 Muscle weakness (generalized): Secondary | ICD-10-CM | POA: Diagnosis not present

## 2023-01-03 DIAGNOSIS — E038 Other specified hypothyroidism: Secondary | ICD-10-CM | POA: Diagnosis not present

## 2023-01-03 DIAGNOSIS — R001 Bradycardia, unspecified: Secondary | ICD-10-CM | POA: Diagnosis not present

## 2023-01-03 DIAGNOSIS — N182 Chronic kidney disease, stage 2 (mild): Secondary | ICD-10-CM | POA: Diagnosis not present

## 2023-01-07 ENCOUNTER — Other Ambulatory Visit: Payer: Self-pay

## 2023-01-07 ENCOUNTER — Observation Stay (HOSPITAL_COMMUNITY)
Admission: EM | Admit: 2023-01-07 | Discharge: 2023-01-08 | Disposition: A | Discharge status: Skilled Nursing Facility | Payer: Medicare Other | Attending: Family Medicine | Admitting: Family Medicine

## 2023-01-07 ENCOUNTER — Emergency Department (HOSPITAL_COMMUNITY): Payer: Medicare Other

## 2023-01-07 ENCOUNTER — Encounter (HOSPITAL_COMMUNITY): Payer: Self-pay

## 2023-01-07 DIAGNOSIS — M6281 Muscle weakness (generalized): Secondary | ICD-10-CM | POA: Insufficient documentation

## 2023-01-07 DIAGNOSIS — E119 Type 2 diabetes mellitus without complications: Secondary | ICD-10-CM | POA: Diagnosis not present

## 2023-01-07 DIAGNOSIS — R001 Bradycardia, unspecified: Secondary | ICD-10-CM | POA: Insufficient documentation

## 2023-01-07 DIAGNOSIS — R079 Chest pain, unspecified: Principal | ICD-10-CM

## 2023-01-07 DIAGNOSIS — E039 Hypothyroidism, unspecified: Secondary | ICD-10-CM | POA: Diagnosis not present

## 2023-01-07 DIAGNOSIS — D72819 Decreased white blood cell count, unspecified: Secondary | ICD-10-CM | POA: Insufficient documentation

## 2023-01-07 DIAGNOSIS — F78A9 Other genetic related intellectual disability: Secondary | ICD-10-CM | POA: Insufficient documentation

## 2023-01-07 DIAGNOSIS — F79 Unspecified intellectual disabilities: Secondary | ICD-10-CM

## 2023-01-07 DIAGNOSIS — D649 Anemia, unspecified: Secondary | ICD-10-CM | POA: Insufficient documentation

## 2023-01-07 DIAGNOSIS — I10 Essential (primary) hypertension: Secondary | ICD-10-CM | POA: Diagnosis not present

## 2023-01-07 DIAGNOSIS — I440 Atrioventricular block, first degree: Secondary | ICD-10-CM | POA: Insufficient documentation

## 2023-01-07 DIAGNOSIS — R2689 Other abnormalities of gait and mobility: Secondary | ICD-10-CM | POA: Diagnosis not present

## 2023-01-07 DIAGNOSIS — T68XXXA Hypothermia, initial encounter: Secondary | ICD-10-CM | POA: Diagnosis not present

## 2023-01-07 DIAGNOSIS — R0789 Other chest pain: Secondary | ICD-10-CM | POA: Diagnosis not present

## 2023-01-07 DIAGNOSIS — R2681 Unsteadiness on feet: Secondary | ICD-10-CM | POA: Diagnosis not present

## 2023-01-07 DIAGNOSIS — Z931 Gastrostomy status: Secondary | ICD-10-CM

## 2023-01-07 DIAGNOSIS — Z8616 Personal history of COVID-19: Secondary | ICD-10-CM | POA: Diagnosis not present

## 2023-01-07 DIAGNOSIS — R680 Hypothermia, not associated with low environmental temperature: Principal | ICD-10-CM | POA: Insufficient documentation

## 2023-01-07 DIAGNOSIS — Z79899 Other long term (current) drug therapy: Secondary | ICD-10-CM | POA: Insufficient documentation

## 2023-01-07 DIAGNOSIS — I9589 Other hypotension: Secondary | ICD-10-CM | POA: Insufficient documentation

## 2023-01-07 LAB — COMPREHENSIVE METABOLIC PANEL
ALT: 21 U/L (ref 0–44)
AST: 29 U/L (ref 15–41)
Albumin: 2.9 g/dL — ABNORMAL LOW (ref 3.5–5.0)
Alkaline Phosphatase: 151 U/L — ABNORMAL HIGH (ref 38–126)
Anion gap: 12 (ref 5–15)
BUN: 33 mg/dL — ABNORMAL HIGH (ref 8–23)
CO2: 21 mmol/L — ABNORMAL LOW (ref 22–32)
Calcium: 8.8 mg/dL — ABNORMAL LOW (ref 8.9–10.3)
Chloride: 101 mmol/L (ref 98–111)
Creatinine, Ser: 0.97 mg/dL (ref 0.61–1.24)
GFR, Estimated: >60 mL/min (ref >60)
Glucose, Bld: 95 mg/dL (ref 70–99)
Potassium: 4.9 mmol/L (ref 3.5–5.1)
Sodium: 134 mmol/L — ABNORMAL LOW (ref 135–145)
Total Bilirubin: 0.6 mg/dL (ref 0.3–1.2)
Total Protein: 7.6 g/dL (ref 6.5–8.1)

## 2023-01-07 LAB — CBC WITH DIFFERENTIAL/PLATELET
Abs Immature Granulocytes: 0.01 10*3/uL (ref 0.00–0.07)
Basophils Absolute: 0 10*3/uL (ref 0.0–0.1)
Basophils Relative: 0 %
Eosinophils Absolute: 0.1 10*3/uL (ref 0.0–0.5)
Eosinophils Relative: 2 %
HCT: 30.9 % — ABNORMAL LOW (ref 39.0–52.0)
Hemoglobin: 10 g/dL — ABNORMAL LOW (ref 13.0–17.0)
Immature Granulocytes: 0 %
Lymphocytes Relative: 35 %
Lymphs Abs: 1.1 10*3/uL (ref 0.7–4.0)
MCH: 31.9 pg (ref 26.0–34.0)
MCHC: 32.4 g/dL (ref 30.0–36.0)
MCV: 98.7 fL (ref 80.0–100.0)
Monocytes Absolute: 0.2 10*3/uL (ref 0.1–1.0)
Monocytes Relative: 6 %
Neutro Abs: 1.9 10*3/uL (ref 1.7–7.7)
Neutrophils Relative %: 57 %
Platelets: 184 10*3/uL (ref 150–400)
RBC: 3.13 MIL/uL — ABNORMAL LOW (ref 4.22–5.81)
RDW: 17.7 % — ABNORMAL HIGH (ref 11.5–15.5)
WBC: 3.3 10*3/uL — ABNORMAL LOW (ref 4.0–10.5)
nRBC: 0 % (ref 0.0–0.2)

## 2023-01-07 LAB — URINALYSIS, ROUTINE W REFLEX MICROSCOPIC
Bilirubin Urine: NEGATIVE
Glucose, UA: NEGATIVE mg/dL
Hgb urine dipstick: NEGATIVE
Ketones, ur: NEGATIVE mg/dL
Leukocytes,Ua: NEGATIVE
Nitrite: NEGATIVE
Protein, ur: NEGATIVE mg/dL
Specific Gravity, Urine: 1.009 (ref 1.005–1.030)
pH: 7 (ref 5.0–8.0)

## 2023-01-07 LAB — I-STAT CG4 LACTIC ACID, ED
Lactic Acid, Venous: 0.9 mmol/L (ref 0.5–1.9)
Lactic Acid, Venous: 1.6 mmol/L (ref 0.5–1.9)

## 2023-01-07 LAB — TROPONIN I (HIGH SENSITIVITY)
Troponin I (High Sensitivity): 7 ng/L (ref <18)
Troponin I (High Sensitivity): 7 ng/L (ref <18)

## 2023-01-07 LAB — TSH: TSH: 5.716 u[IU]/mL — ABNORMAL HIGH (ref 0.350–4.500)

## 2023-01-07 MED ORDER — ACETAMINOPHEN 325 MG PO TABS: 650.0000 mg | ORAL_TABLET | Freq: Four times a day (QID) | ORAL | Status: DC | PRN

## 2023-01-07 MED ORDER — ACETAMINOPHEN 650 MG RE SUPP: 650.0000 mg | Freq: Four times a day (QID) | RECTAL | Status: DC | PRN

## 2023-01-07 MED ORDER — ENOXAPARIN SODIUM 40 MG/0.4ML IJ SOSY: 40.0000 mg | PREFILLED_SYRINGE | INTRAMUSCULAR | Status: DC

## 2023-01-07 MED ORDER — SENNOSIDES-DOCUSATE SODIUM 8.6-50 MG PO TABS: 1.0000 | ORAL_TABLET | Freq: Every evening | ORAL | Status: DC | PRN

## 2023-01-07 MED ORDER — ALUM & MAG HYDROXIDE-SIMETH 200-200-20 MG/5ML PO SUSP
30.0000 mL | Freq: Once | ORAL | Status: AC
  Administered 2023-01-07: 30 mL via ORAL
  Filled 2023-01-07: qty 30

## 2023-01-07 MED ORDER — ONDANSETRON HCL 4 MG/2ML IJ SOLN
4.0000 mg | Freq: Four times a day (QID) | INTRAMUSCULAR | Status: DC | PRN
  Administered 2023-01-08: 4 mg via INTRAVENOUS
  Filled 2023-01-07: qty 2

## 2023-01-07 MED ORDER — ONDANSETRON HCL 4 MG PO TABS: 4.0000 mg | ORAL_TABLET | Freq: Four times a day (QID) | ORAL | Status: DC | PRN

## 2023-01-07 NOTE — ED Triage Notes (Signed)
Pt BIB [facility] from [facility] with c/o chest pain x 1.5 hours. Pt was given 1 SL nitroglycerin by facility staff. 324 mg ASA given by EMS. Denies nausea, vomiting, or shortness of breath. Lung sounds clear per EMS. GCS 14 at baseline.  EMS Vitals: HR 50 BP 132/70 O2 98% room air CBG 91 20 ga L wrist

## 2023-01-07 NOTE — H&P (Signed)
History and Physical    Daniel Bailey. ONG:[id number] DOB: [birth year] DOA: 01/07/2023  PCP: [name], MD  Patient coming from: SNF  I have personally briefly reviewed patient's old medical records in [facility]  Chief Complaint: Chest discomfort  HPI: Daniel Bailey. is a 72 y.o. male with medical history significant for intellectual disability, recurrent aspiration s/p G-tube, chronic leukopenia, history of GI bleed, anemia, T2DM, hypothyroidism, tremors who presented to the ED from SNF for evaluation of chest discomfort.  History is limited from patient due to intellectual disability and is otherwise supplemented by EDP and chart review.  Patient was brought to the ED from his SNF due to reported complaint of chest discomfort which began earlier today.  EMS were called to his facility and he was given aspirin and nitroglycerin.  He was brought to the ED for further evaluation.  Patient currently denies any pain including chest pain or abdominal pain.  He has a chronic G-tube in place and states that is functioning well.  He denies any nausea, vomiting, dyspnea, dysuria.  ED Course  Labs/Imaging on admission: I have personally reviewed following labs and imaging studies.  Initial vitals showed BP 127/68, pulse 46, RR 14, temp 94.1 F rectally.  Patient placed on Bair hugger and remained hypothermic with repeat temp 94.2 F rectally.  Labs show sodium 134, potassium 4.9, bicarb 21, BUN 33, creatinine 0.97, serum glucose 95, AST 29, ALT 21, alk phos 151, total bilirubin 0.6, WBC 3.3, hemoglobin 10.0, platelets 1 84,000, troponin 7 x 2, lactic acid 0.9.  Urinalysis negative for UTI.  Blood cultures in process.  TSH 5.716.  Portable chest x-ray negative for focal consolidation, edema, effusion.  Patient was given Maalox.  The hospitalist service was consulted to admit for persistent hypothermia.  Review of Systems: All systems reviewed and are negative except as documented in  history of present illness above.   Past Medical History:  Diagnosis Date   Acute encephalopathy 09/02/2021   Anemia    Aspiration pneumonia (HCC)    Colon cancer screening 08/18/2011   Elevated PSA    Frequent urination    Glaucoma    History of COVID-19    Hyperglycemia    Hyperlipidemia    Hypertension    Sleep apnea     Past Surgical History:  Procedure Laterality Date   BIOPSY  04/20/2022   Procedure: BIOPSY;  Surgeon: Beverley Fiedler, MD;  Location: [facility] ENDOSCOPY;  Service: Gastroenterology;;   COLONOSCOPY     ESOPHAGOGASTRODUODENOSCOPY (EGD) WITH PROPOFOL N/A 04/20/2022   Procedure: ESOPHAGOGASTRODUODENOSCOPY (EGD) WITH PROPOFOL;  Surgeon: Beverley Fiedler, MD;  Location: [facility] ENDOSCOPY;  Service: Gastroenterology;  Laterality: N/A;   IR GASTROSTOMY TUBE MOD SED  11/10/2021   PROSTATE BIOPSY N/A 11/20/2017   Procedure: BIOPSY TRANSRECTAL ULTRASONIC PROSTATE (TUBP);  Surgeon: Riki Altes, MD;  Location: [facility] ORS;  Service: Urology;  Laterality: N/A;    Social History:  reports that he has never smoked. He has never been exposed to tobacco smoke. He has never used smokeless tobacco. He reports that he does not drink alcohol and does not use drugs.  No Known Allergies  Family History  Problem Relation Age of Onset   Colon cancer Mother    Kidney disease Mother    Cancer Father        unknown     Prior to Admission medications   Medication Sig Start Date End Date Taking? Authorizing Provider  acetaminophen (TYLENOL)  500 MG tablet Place 2 tablets (1,000 mg total) into feeding tube every 8 (eight) hours as needed for moderate pain. 11/11/21   Burnadette Pop, MD  ferrous sulfate 220 (44 Fe) MG/5ML solution Place 7 mLs into feeding tube daily.    [provider]  levothyroxine (SYNTHROID) 50 MCG tablet Take 50 mcg by mouth daily.    [provider]  loratadine (CLARITIN) 10 MG tablet Take 10 mg by mouth daily.    [provider]  magnesium  hydroxide (MILK OF MAGNESIA) 400 MG/5ML suspension Take 30 mLs by mouth 2 (two) times daily as needed for moderate constipation.    [provider]  midodrine (PROAMATINE) 5 MG tablet Place 1 tablet (5 mg total) into feeding tube 3 (three) times daily with meals. Patient taking differently: Place 2.5 mg into feeding tube daily. For orthostatic hypotension 11/11/21   Burnadette Pop, MD  Nutritional Supplements (FEEDING SUPPLEMENT, JEVITY 1.5 CAL/FIBER,) LIQD Place 237 mLs into feeding tube 5 (five) times daily.    [provider]  omeprazole (PRILOSEC) 20 MG capsule 20 mg See admin instructions. 20 mg via gtube twice daily    [provider]  ondansetron (ZOFRAN-ODT) 4 MG disintegrating tablet 4 mg See admin instructions. 4 mg via g tube three times daily as needed for nausea and vomiting 04/19/22   [provider]  Water For Irrigation, Sterile (FREE WATER) SOLN Place 225 mLs into feeding tube every 6 (six) hours.    [provider]    Physical Exam: Vitals:   01/07/23 2245 01/07/23 2300 01/07/23 2330 01/08/23 0000  BP: 102/65 107/61 129/64 114/73  Pulse: (!) 52 62 73 70  Resp: 12 19 (!) 21   Temp:      TempSrc:      SpO2: 94% 95% 97% 96%  Weight:      Height:       Constitutional: Resting in bed, wearing Bair hugger.  NAD, calm, comfortable Eyes: EOMI, lids and conjunctivae normal ENMT: Mucous membranes are moist. Posterior pharynx clear of any exudate or lesions.Normal dentition.  Neck: normal, supple, no masses. Respiratory: clear to auscultation bilaterally, no wheezing, no crackles. Normal respiratory effort. No accessory muscle use.  Cardiovascular: Regular rate and rhythm, no murmurs / rubs / gallops. No extremity edema. 2+ pedal pulses. Abdomen: G-tube in place, insertion site clean and dry.  No tenderness, no masses palpated.  Musculoskeletal: no clubbing / cyanosis. No joint deformity upper and lower extremities. Good ROM, no  contractures. Normal muscle tone.  Skin: no rashes, lesions, ulcers. No induration Neurologic: Sensation intact. Strength 5/5 in all 4.  Psychiatric: Alert and oriented to self, knows that he is in the hospital.  Does not know the year.  EKG: Personally reviewed. Sinus bradycardia, rate 44, first-degree AV block, RBBB and LPFB, no acute ischemic changes.  Rate is slower when compared to previous.  Assessment/Plan Principal Problem:   Hypothermia Active Problems:   Intellectual disability   Chronic hypotension   Leukocytopenia   Bradycardia   Feeding by G-tube (HCC)   Tavarus Madriaga. is a 72 y.o. male with medical history significant for intellectual disability, recurrent aspiration s/p G-tube, chronic leukopenia, history of GI bleed, anemia, T2DM, hypothyroidism, tremors who is admitted with hypothermia.  Assessment and Plan: Hypothermia: Remains hypothermic despite time on Bair hugger with repeat temp 94.2 F rectally.  Unclear etiology.  No obvious infectious process.  TSH only mildly elevated.  He is mentating at baseline. Chief Technology Officer  as needed -Check a.m. cortisol level -UA negative for UTI -Follow blood cultures  Sinus bradycardia with first-degree AV block: Bradycardic on arrival with HR 40s-50s but improved to 60-70s at time of admission.  Chronic hypotension: On midodrine as an outpatient.  BP is stable, will hold midodrine as it can contribute to bradycardia.  Hypothyroidism: Continue Synthroid 50 mcg per tube.  TSH slightly elevated at 5.716.  Chronic leukopenia: Chronic and stable.  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 Had been seen by hematology Dr. Cathie Hoops in the past who felt leukopenia was due to reactive changes from infectious process.  Intellectual disability: Mental status is at baseline on admission.  Recurrent aspiration s/p G-tube: Continue meds, feeding supplement, free water per tube.  History of GI  bleed/gastritis/GERD: Continue iron supplement.  Normocytic anemia: Hemoglobin is stable.   DVT prophylaxis: enoxaparin (LOVENOX) injection 40 mg Start: 01/08/23 1400 Code Status: DNR-Limited.  ACP documents and MOST form at bedside reviewed. Family Communication: None present on admission Disposition Plan: From SNF and likely discharge to SNF pending clinical progress Consults called: None Severity of Illness: The appropriate patient status for this patient is OBSERVATION. Observation status is judged to be reasonable and necessary in order to provide the required intensity of service to ensure the patient's safety. The patient's presenting symptoms, physical exam findings, and initial radiographic and laboratory data in the context of their medical condition is felt to place them at decreased risk for further clinical deterioration. Furthermore, it is anticipated that the patient will be medically stable for discharge from the hospital within 2 midnights of admission.   Darreld Mclean MD [facility]  If 7PM-7AM, please contact night-coverage [email or web address]  01/08/2023, 12:28 AM

## 2023-01-07 NOTE — ED Provider Notes (Signed)
[place] EMERGENCY DEPARTMENT AT [facility] Provider Note   CSN: [id number] Arrival date & time: 01/07/23  1909     History {Add pertinent medical, surgical, social history, OB history to HPI:1} Chief Complaint  Patient presents with   Chest Pain    Daniel Bailey. is a 72 y.o. male.  He is presenting from his care facility after complaining of chest pain that started earlier today.  Level 5 caveat secondary to intellectual disability.  Patient states it is in the center of his chest.  Does not radiate anywhere.  He is unable to quantify it.  He is not sure if he has had it before.  Not associated with shortness of breath nausea or vomiting.  He was given aspirin by EMS and nitroglycerin by the nursing staff.  It does not sound like the pain is changed.  Of note he was admitted to the hospital last month for altered mental status.  There was no clear etiology identified but he was bradycardic on presentation.  Hypothermic.  The history is provided by the patient.  Chest Pain Pain location:  Substernal area Timing:  Constant Progression:  Unchanged Chronicity:  New Relieved by:  Nothing Worsened by:  Nothing Ineffective treatments:  Aspirin and nitroglycerin Associated symptoms: no abdominal pain, no cough, no diaphoresis, no nausea, no shortness of breath and no vomiting        Home Medications Prior to Admission medications   Medication Sig Start Date End Date Taking? Authorizing Provider  acetaminophen (TYLENOL) 500 MG tablet Place 2 tablets (1,000 mg total) into feeding tube every 8 (eight) hours as needed for moderate pain. 11/11/21   Burnadette Pop, MD  ferrous sulfate 220 (44 Fe) MG/5ML solution Place 7 mLs into feeding tube daily.    [provider]  levothyroxine (SYNTHROID) 50 MCG tablet Take 50 mcg by mouth daily.    [provider]  loratadine (CLARITIN) 10 MG tablet Take 10 mg by mouth daily.    [provider]  magnesium  hydroxide (MILK OF MAGNESIA) 400 MG/5ML suspension Take 30 mLs by mouth 2 (two) times daily as needed for moderate constipation.    [provider]  midodrine (PROAMATINE) 5 MG tablet Place 1 tablet (5 mg total) into feeding tube 3 (three) times daily with meals. Patient taking differently: Place 2.5 mg into feeding tube daily. For orthostatic hypotension 11/11/21   Burnadette Pop, MD  Nutritional Supplements (FEEDING SUPPLEMENT, JEVITY 1.5 CAL/FIBER,) LIQD Place 237 mLs into feeding tube 5 (five) times daily.    [provider]  omeprazole (PRILOSEC) 20 MG capsule 20 mg See admin instructions. 20 mg via gtube twice daily    [provider]  ondansetron (ZOFRAN-ODT) 4 MG disintegrating tablet 4 mg See admin instructions. 4 mg via g tube three times daily as needed for nausea and vomiting 04/19/22   [provider]  Water For Irrigation, Sterile (FREE WATER) SOLN Place 225 mLs into feeding tube every 6 (six) hours.    [provider]      Allergies    Patient has no known allergies.    Review of Systems   Review of Systems  Unable to perform ROS: Other  Constitutional:  Negative for diaphoresis.  Respiratory:  Negative for cough and shortness of breath.   Cardiovascular:  Positive for chest pain.  Gastrointestinal:  Negative for abdominal pain, nausea and vomiting.    Physical Exam Updated Vital Signs BP 136/72   Pulse Marland Kitchen)  47   Temp (!) 94.1 F (34.5 C) (Rectal)   Resp 13   SpO2 100%  Physical Exam Vitals and nursing note reviewed.  Constitutional:      General: He is not in acute distress.    Appearance: He is well-developed.  HENT:     Head: Normocephalic and atraumatic.  Eyes:     Conjunctiva/sclera: Conjunctivae normal.  Cardiovascular:     Rate and Rhythm: Normal rate and regular rhythm.     Heart sounds: No murmur heard. Pulmonary:     Effort: Pulmonary effort is normal. No respiratory distress.     Breath sounds: Normal  breath sounds.  Abdominal:     Palpations: Abdomen is soft.     Tenderness: There is no abdominal tenderness.  Musculoskeletal:        General: No swelling.     Cervical back: Neck supple.  Skin:    General: Skin is warm and dry.     Capillary Refill: Capillary refill takes less than 2 seconds.  Neurological:     General: No focal deficit present.     Mental Status: He is alert.     Comments: He is slow to answer but is appropriate in his answers.     ED Results / Procedures / Treatments   Labs (all labs ordered are listed, but only abnormal results are displayed) Labs Reviewed  BASIC METABOLIC PANEL  CBC WITH DIFFERENTIAL/PLATELET  TROPONIN I (HIGH SENSITIVITY)    EKG EKG Interpretation Date/Time:  Sunday January 07 2023 19:23:44 EDT Ventricular Rate:  44 PR Interval:  233 QRS Duration:  162 QT Interval:  483 QTC Calculation: 414 R Axis:   96  Text Interpretation: Sinus bradycardia Prolonged PR interval RBBB and LPFB rate is slower than prior 8/24 Confirmed by Meridee Score [phone]) on 01/07/2023 7:29:46 PM  Radiology No results found.  Procedures Procedures  {Document cardiac monitor, telemetry assessment procedure when appropriate:1}  Medications Ordered in ED Medications - No data to display  ED Course/ Medical Decision Making/ A&P   {   Click here for ABCD2, HEART and other calculatorsREFRESH Note before signing :1}                              Medical Decision Making Amount and/or Complexity of Data Reviewed Labs: ordered. Radiology: ordered.   This patient complains of ***; this involves an extensive number of treatment Options and is a complaint that carries with it a high risk of complications and morbidity. The differential includes ***  I ordered, reviewed and interpreted labs, which included *** I ordered medication *** and reviewed PMP when indicated. I ordered imaging studies which included *** and I independently    visualized and  interpreted imaging which showed *** Additional history obtained from *** Previous records obtained and reviewed *** I consulted *** and discussed lab and imaging findings and discussed disposition.  Cardiac monitoring reviewed, *** Social determinants considered, *** Critical Interventions: ***  After the interventions stated above, I reevaluated the patient and found *** Admission and further testing considered, ***   {Document critical care time when appropriate:1} {Document review of labs and clinical decision tools ie heart score, Chads2Vasc2 etc:1}  {Document your independent review of radiology images, and any outside records:1} {Document your discussion with family members, caretakers, and with consultants:1} {Document social determinants of health affecting pt's care:1} {Document your decision making why or why not admission, treatments were needed:1}  Final Clinical Impression(s) / ED Diagnoses Final diagnoses:  None    Rx / DC Orders ED Discharge Orders     None

## 2023-01-08 DIAGNOSIS — T68XXXA Hypothermia, initial encounter: Secondary | ICD-10-CM | POA: Diagnosis not present

## 2023-01-08 DIAGNOSIS — Z931 Gastrostomy status: Secondary | ICD-10-CM

## 2023-01-08 DIAGNOSIS — R680 Hypothermia, not associated with low environmental temperature: Secondary | ICD-10-CM | POA: Diagnosis not present

## 2023-01-08 DIAGNOSIS — R079 Chest pain, unspecified: Secondary | ICD-10-CM | POA: Diagnosis not present

## 2023-01-08 DIAGNOSIS — Z7401 Bed confinement status: Secondary | ICD-10-CM | POA: Diagnosis not present

## 2023-01-08 LAB — CBC
HCT: 32 % — ABNORMAL LOW (ref 39.0–52.0)
Hemoglobin: 10.6 g/dL — ABNORMAL LOW (ref 13.0–17.0)
MCH: 32.2 pg (ref 26.0–34.0)
MCHC: 33.1 g/dL (ref 30.0–36.0)
MCV: 97.3 fL (ref 80.0–100.0)
Platelets: 243 10*3/uL (ref 150–400)
RBC: 3.29 MIL/uL — ABNORMAL LOW (ref 4.22–5.81)
RDW: 17.6 % — ABNORMAL HIGH (ref 11.5–15.5)
WBC: 3.4 10*3/uL — ABNORMAL LOW (ref 4.0–10.5)
nRBC: 0 % (ref 0.0–0.2)

## 2023-01-08 LAB — BASIC METABOLIC PANEL
Anion gap: 10 (ref 5–15)
BUN: 33 mg/dL — ABNORMAL HIGH (ref 8–23)
CO2: 23 mmol/L (ref 22–32)
Calcium: 9.3 mg/dL (ref 8.9–10.3)
Chloride: 101 mmol/L (ref 98–111)
Creatinine, Ser: 1.22 mg/dL (ref 0.61–1.24)
GFR, Estimated: >60 mL/min (ref >60)
Glucose, Bld: 86 mg/dL (ref 70–99)
Potassium: 5 mmol/L (ref 3.5–5.1)
Sodium: 134 mmol/L — ABNORMAL LOW (ref 135–145)

## 2023-01-08 LAB — CORTISOL-AM, BLOOD: Cortisol - AM: 21.4 ug/dL (ref 6.7–22.6)

## 2023-01-08 MED ORDER — LEVOTHYROXINE SODIUM 75 MCG PO TABS: 75.0000 ug | ORAL_TABLET | Freq: Every day | ORAL | 0 refills | Status: DC

## 2023-01-08 MED ORDER — FREE WATER
225.0000 mL | Freq: Four times a day (QID) | Status: DC
  Administered 2023-01-08 (×2): 225 mL

## 2023-01-08 MED ORDER — LORATADINE 10 MG PO TABS
10.0000 mg | ORAL_TABLET | Freq: Every day | ORAL | Status: DC
  Administered 2023-01-08: 10 mg
  Filled 2023-01-08: qty 1

## 2023-01-08 MED ORDER — MAGNESIUM HYDROXIDE 400 MG/5ML PO SUSP: 30.0000 mL | Freq: Two times a day (BID) | ORAL | Status: DC | PRN

## 2023-01-08 MED ORDER — LORAZEPAM 2 MG/ML IJ SOLN
1.0000 mg | INTRAMUSCULAR | Status: DC | PRN
  Administered 2023-01-08: 1 mg via INTRAVENOUS
  Filled 2023-01-08: qty 1

## 2023-01-08 MED ORDER — LEVOTHYROXINE SODIUM 75 MCG PO TABS: 75.0000 ug | ORAL_TABLET | Freq: Every day | ORAL | Status: DC

## 2023-01-08 MED ORDER — FERROUS SULFATE 300 (60 FE) MG/5ML PO SOLN
300.0000 mg | Freq: Every day | ORAL | Status: DC
  Administered 2023-01-08: 300 mg
  Filled 2023-01-08: qty 5

## 2023-01-08 MED ORDER — LEVOTHYROXINE SODIUM 50 MCG PO TABS
50.0000 ug | ORAL_TABLET | Freq: Every day | ORAL | Status: DC
  Administered 2023-01-08: 50 ug
  Filled 2023-01-08: qty 2

## 2023-01-08 MED ORDER — JEVITY 1.5 CAL/FIBER PO LIQD
237.0000 mL | Freq: Every day | ORAL | Status: DC
  Administered 2023-01-08: 237 mL
  Filled 2023-01-08 (×3): qty 237

## 2023-01-08 NOTE — ED Notes (Signed)
PTAR has been scheduled for the patient.  ETA is within the hour

## 2023-01-08 NOTE — ED Notes (Signed)
Called pharmacy regarding tube feeding, they advised they would make sure to send it up.

## 2023-01-08 NOTE — Discharge Summary (Signed)
Physician Discharge Summary  Cardell Peach. ZOX:[id number] DOB: [birth year] DOA: 01/07/2023  PCP: [name], MD  Admit date: 01/07/2023 Discharge date: 01/08/2023    Admitted From: SNF Disposition: SNF  Recommendations for Outpatient Follow-up:  Follow up with PCP in 1-2 weeks Please obtain BMP/CBC in one week Please follow up with your PCP on the following pending results: Unresulted Labs (From admission, onward)    None         Home Health: None Equipment/Devices: None  Discharge Condition: Stable CODE STATUS: DNR Diet recommendation: Cardiac  Subjective: Seen and examined in the ED.  He had no complaint.  He was alert and oriented x 1.  Has intellectual disability, likely this is his baseline.  He offers no complaints.  Brief/Interim Summary: He was admitted with following problems.  Hypothermia: Temperature upon presentation was 94.1.  He was given p.o. turgor and temperature is improving and currently 97.6.  TSH only slightly elevated, doubt hypothyroidism can cause hypothermia like this.  No source identified.  Infectious workup negative.    Sinus bradycardia with first-degree AV block: Bradycardic on arrival with HR 40s-50s but improved to 60-70s at time of admission.  Midodrine was held due to concern of bradycardia.  His bradycardia could very well be due to hypothermia as well.  TSH elevated.   Chronic hypotension: On midodrine as an outpatient.  BP is stable, continue to hold midodrine.   Acquired hypothyroidism: On Synthroid 50 mcg daily.  TSH slightly elevated at 5.7, will increase Synthroid to 75 mcg.   Chest discomfort: Apparently this was the main reason that he was sent from the facility but patient denied any chest pain to the admitted or to me.  Troponins are negative.   Chronic leukopenia: Chronic and stable.  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 Had been seen by hematology Dr. Cathie Hoops in the past who felt leukopenia was  due to reactive changes from infectious process.  Follow-up with oncology as outpatient as needed.   Intellectual disability: Mental status is at baseline on admission.   Recurrent aspiration s/p G-tube: Continue meds, feeding supplement, free water per tube.   History of GI bleed/gastritis/GERD: Continue iron supplement.   Normocytic anemia: Hemoglobin is stable.  Discharge plan was discussed with patient and/or family member and they verbalized understanding and agreed with it.  Discharge Diagnoses:  Principal Problem:   Hypothermia Active Problems:   Intellectual disability   Chronic hypotension   Leukocytopenia   Bradycardia   Feeding by G-tube [facility])    Discharge Instructions   Allergies as of 01/08/2023   No Known Allergies      Medication List     TAKE these medications    acetaminophen 500 MG tablet Commonly known as: TYLENOL Place 2 tablets (1,000 mg total) into feeding tube every 8 (eight) hours as needed for moderate pain.   feeding supplement (JEVITY 1.5 CAL/FIBER) Liqd Place 237 mLs into feeding tube 5 (five) times daily.   ferrous sulfate 220 (44 Fe) MG/5ML solution Place 7 mLs into feeding tube daily.   free water Soln Place 225 mLs into feeding tube every 6 (six) hours.   levothyroxine 50 MCG tablet Commonly known as: SYNTHROID Place 50 mcg into feeding tube daily.   loratadine 10 MG tablet Commonly known as: CLARITIN Place 10 mg into feeding tube daily.   magnesium hydroxide 400 MG/5ML suspension Commonly known as: MILK OF MAGNESIA Place 30 mLs into feeding tube 2 (two) times daily  as needed for moderate constipation.   midodrine 5 MG tablet Commonly known as: PROAMATINE Place 1 tablet (5 mg total) into feeding tube 3 (three) times daily with meals. What changed: additional instructions   omeprazole 20 MG capsule Commonly known as: PRILOSEC 20 mg See admin instructions. Give 1 capsule per tube twice a day   ondansetron 4 MG  disintegrating tablet Commonly known as: ZOFRAN-ODT 4 mg See admin instructions. Give 4 mg per tube three times daily as needed for nausea and vomiting        Follow-up Information     Tower, Audrie Gallus, MD Follow up in 1 week(s).   Specialties: Family Medicine, Radiology Contact information: [address] [place] [address] [phone]                No Known Allergies  Consultations: None   Procedures/Studies: DG Chest Port 1 View  Result Date: 01/07/2023 CLINICAL DATA:  Chest pain for 1.5 hours EXAM: PORTABLE CHEST 1 VIEW COMPARISON:  12/12/2022 FINDINGS: The heart size and mediastinal contours are within normal limits. Both lungs are clear. The visualized skeletal structures are unremarkable. IMPRESSION: No active disease. Electronically Signed   By: Sharlet Salina M.D.   On: 01/07/2023 20:01   CT CHEST W CONTRAST  Result Date: 12/12/2022 CLINICAL DATA:  Abnormal chest x-ray EXAM: CT CHEST WITH CONTRAST TECHNIQUE: Multidetector CT imaging of the chest was performed during intravenous contrast administration. RADIATION DOSE REDUCTION: This exam was performed according to the departmental dose-optimization program which includes automated exposure control, adjustment of the mA and/or kV according to patient size and/or use of iterative reconstruction technique. CONTRAST:  50mL OMNIPAQUE IOHEXOL 350 MG/ML SOLN COMPARISON:  Same day chest x-ray; CT chest, abdomen and pelvis dated July 12, 2021 FINDINGS: Cardiovascular: Normal heart size. Trace pericardial effusion. Normal caliber thoracic aorta with mild atherosclerotic disease. Mild coronary artery calcifications. Mediastinum/Nodes: Small hiatal hernia. Thyroid is unremarkable. No enlarged lymph nodes seen in the chest. Lungs/Pleura: Central airways are patent. Bilateral bronchial wall thickening, most severe in the bilateral lower lobes. New bilateral solid pulmonary nodules which are most pronounced in the bilateral  lower lobes and posterior upper lobes. Largest is a solid nodule of the superior portion of the left lower lobe measuring 7 mm on series 3, image 49. Consolidation seen in the left lower lobe on prior exam has resolved. Upper Abdomen: Gallstones. Low-attenuation hepatic lesions, unchanged when compared with the prior exam and likely simple cysts. No acute abnormality. Musculoskeletal: No chest wall abnormality. No acute or significant osseous findings. IMPRESSION: 1. Bilateral bronchial wall thickening and new bilateral solid pulmonary nodules, most pronounced in the bilateral lower lobes and posterior upper lobes, likely due to infection or aspiration. 2. Largest new solid nodule measures 7 mm. Non-contrast chest CT at 3-6 months is recommended. If the nodules are stable at time of repeat CT, then future CT at 18-24 months (from today's scan) is considered optional for low-risk patients, but is recommended for high-risk patients. This recommendation follows the consensus statement: Guidelines for Management of Incidental Pulmonary Nodules Detected on CT Images: From the Fleischner Society 2017; Radiology 2017; 284:228-243. 3. Aortic Atherosclerosis (ICD10-I70.0). Electronically Signed   By: Allegra Lai M.D.   On: 12/12/2022 19:34   CT Head Wo Contrast  Result Date: 12/12/2022 CLINICAL DATA:  Mental status change, unknown cause EXAM: CT HEAD WITHOUT CONTRAST TECHNIQUE: Contiguous axial images were obtained from the base of the skull through the vertex without intravenous  contrast. RADIATION DOSE REDUCTION: This exam was performed according to the departmental dose-optimization program which includes automated exposure control, adjustment of the mA and/or kV according to patient size and/or use of iterative reconstruction technique. COMPARISON:  CT head 09/02/2021 FINDINGS: Brain: No evidence of large-territorial acute infarction. No parenchymal hemorrhage. No mass lesion. No extra-axial collection. No mass  effect or midline shift. No hydrocephalus. Basilar cisterns are patent. Vascular: No hyperdense vessel. Skull: No acute fracture or focal lesion. Sinuses/Orbits: Paranasal sinuses and mastoid air cells are clear. The orbits are unremarkable. Other: None. IMPRESSION: No acute intracranial abnormality. Electronically Signed   By: Tish Frederickson M.D.   On: 12/12/2022 13:36   DG Chest Portable 1 View  Result Date: 12/12/2022 CLINICAL DATA:  Altered mental status. EXAM: PORTABLE CHEST 1 VIEW COMPARISON:  April 19, 2022. FINDINGS: Stable cardiomediastinal silhouette. Minimal right basilar subsegmental atelectasis is noted. New nodular density is noted in left upper lobe. Bony thorax is unremarkable. IMPRESSION: New nodular density seen in left upper lobe. CT scan of the chest is recommended for further evaluation. Electronically Signed   By: Lupita Raider M.D.   On: 12/12/2022 12:38     Discharge Exam: Vitals:   01/08/23 1000 01/08/23 1105  BP: 112/61 105/62  Pulse:  75  Resp:  17  Temp:  97.6 F (36.4 C)  SpO2:  99%   Vitals:   01/08/23 0833 01/08/23 0903 01/08/23 1000 01/08/23 1105  BP: 124/69  112/61 105/62  Pulse: (!) 59   75  Resp: 17   17  Temp:  (!) 97.4 F (36.3 C)  97.6 F (36.4 C)  TempSrc:  Oral  Oral  SpO2:    99%  Weight:      Height:        General: Pt is alert, awake, not in acute distress Cardiovascular: RRR, S1/S2 +, no rubs, no gallops Respiratory: CTA bilaterally, no wheezing, no rhonchi Abdominal: Soft, NT, ND, bowel sounds + Extremities: no edema, no cyanosis Neuro: Alert and oriented x 1.  No focal deficit.  Has intellectual disability.   The results of significant diagnostics from this hospitalization (including imaging, microbiology, ancillary and laboratory) are listed below for reference.     Microbiology: Recent Results (from the past 240 hour(s))  Culture, blood (routine x 2)     Status: None (Preliminary result)   Collection Time: 01/07/23   7:47 PM   Specimen: BLOOD RIGHT ARM  Result Value Ref Range Status   Specimen Description BLOOD RIGHT ARM  Final   Special Requests   Final    BOTTLES DRAWN AEROBIC AND ANAEROBIC Blood Culture results may not be optimal due to an excessive volume of blood received in culture bottles   Culture   Final    NO GROWTH < 12 HOURS Performed at [facility], [address] [place], [place] [address]    Report Status PENDING  Incomplete  Culture, blood (routine x 2)     Status: None (Preliminary result)   Collection Time: 01/07/23  7:47 PM   Specimen: BLOOD  Result Value Ref Range Status   Specimen Description BLOOD SITE NOT SPECIFIED  Final   Special Requests   Final    BOTTLES DRAWN AEROBIC AND ANAEROBIC Blood Culture adequate volume   Culture   Final    NO GROWTH < 12 HOURS Performed at [facility], [address] [place], [place] [address]    Report Status PENDING  Incomplete  Labs: BNP (last 3 results) No results for input(s): "BNP" in the last 8760 hours. Basic Metabolic Panel: Recent Labs  Lab 01/07/23 1947 01/08/23 0351  NA 134* 134*  K 4.9 5.0  CL 101 101  CO2 21* 23  GLUCOSE 95 86  BUN 33* 33*  CREATININE 0.97 1.22  CALCIUM 8.8* 9.3   Liver Function Tests: Recent Labs  Lab 01/07/23 1947  AST 29  ALT 21  ALKPHOS 151*  BILITOT 0.6  PROT 7.6  ALBUMIN 2.9*   No results for input(s): "LIPASE", "AMYLASE" in the last 168 hours. No results for input(s): "AMMONIA" in the last 168 hours. CBC: Recent Labs  Lab 01/07/23 1947 01/08/23 0351  WBC 3.3* 3.4*  NEUTROABS 1.9  --   HGB 10.0* 10.6*  HCT 30.9* 32.0*  MCV 98.7 97.3  PLT 184 243   Cardiac Enzymes: No results for input(s): "CKTOTAL", "CKMB", "CKMBINDEX", "TROPONINI" in the last 168 hours. BNP: Invalid input(s): "POCBNP" CBG: No results for input(s): "GLUCAP" in the last 168 hours. D-Dimer No results for input(s): "DDIMER" in the last 72 hours. Hgb A1c No results for input(s):  "HGBA1C" in the last 72 hours. Lipid Profile No results for input(s): "CHOL", "HDL", "LDLCALC", "TRIG", "CHOLHDL", "LDLDIRECT" in the last 72 hours. Thyroid function studies Recent Labs    01/07/23 1947  TSH 5.716*   Anemia work up No results for input(s): "VITAMINB12", "FOLATE", "FERRITIN", "TIBC", "IRON", "RETICCTPCT" in the last 72 hours. Urinalysis    Component Value Date/Time   COLORURINE YELLOW 01/07/2023 2000   APPEARANCEUR CLEAR 01/07/2023 2000   LABSPEC 1.009 01/07/2023 2000   PHURINE 7.0 01/07/2023 2000   GLUCOSEU NEGATIVE 01/07/2023 2000   HGBUR NEGATIVE 01/07/2023 2000   BILIRUBINUR NEGATIVE 01/07/2023 2000   BILIRUBINUR neg 08/10/2021 1616   KETONESUR NEGATIVE 01/07/2023 2000   PROTEINUR NEGATIVE 01/07/2023 2000   UROBILINOGEN 0.2 08/10/2021 1616   NITRITE NEGATIVE 01/07/2023 2000   LEUKOCYTESUR NEGATIVE 01/07/2023 2000   Sepsis Labs Recent Labs  Lab 01/07/23 1947 01/08/23 0351  WBC 3.3* 3.4*   Microbiology Recent Results (from the past 240 hour(s))  Culture, blood (routine x 2)     Status: None (Preliminary result)   Collection Time: 01/07/23  7:47 PM   Specimen: BLOOD RIGHT ARM  Result Value Ref Range Status   Specimen Description BLOOD RIGHT ARM  Final   Special Requests   Final    BOTTLES DRAWN AEROBIC AND ANAEROBIC Blood Culture results may not be optimal due to an excessive volume of blood received in culture bottles   Culture   Final    NO GROWTH < 12 HOURS Performed at [facility], [address], [place], [place] [address]    Report Status PENDING  Incomplete  Culture, blood (routine x 2)     Status: None (Preliminary result)   Collection Time: 01/07/23  7:47 PM   Specimen: BLOOD  Result Value Ref Range Status   Specimen Description BLOOD SITE NOT SPECIFIED  Final   Special Requests   Final    BOTTLES DRAWN AEROBIC AND ANAEROBIC Blood Culture adequate volume   Culture   Final    NO GROWTH < 12 HOURS Performed at [facility], [address] [place], [place] [address]    Report Status PENDING  Incomplete    FURTHER DISCHARGE INSTRUCTIONS:   Get Medicines reviewed and adjusted: Please take all your medications with you for your next visit with your Primary MD  Laboratory/radiological data: Please request your Primary MD to go over all hospital tests and procedure/radiological results at the follow up, please ask your Primary MD to get all Hospital records sent to his/her office.   In some cases, they will be blood work, cultures and biopsy results pending at the time of your discharge. Please request that your primary care M.D. goes through all the records of your hospital data and follows up on these results.   Also Note the following: If you experience worsening of your admission symptoms, develop shortness of breath, life threatening emergency, suicidal or homicidal thoughts you must seek medical attention immediately by calling 911 or calling your MD immediately  if symptoms less severe.   You must read complete instructions/literature along with all the possible adverse reactions/side effects for all the Medicines you take and that have been prescribed to you. Take any new Medicines after you have completely understood and accpet all the possible adverse reactions/side effects.    Do not drive when taking Pain medications or sleeping medications (Benzodaizepines)   Do not take more than prescribed Pain, Sleep and Anxiety Medications. It is not advisable to combine anxiety,sleep and pain medications without talking with your primary care practitioner   Special Instructions: If you have smoked or chewed Tobacco  in the last 2 yrs please stop smoking, stop any regular Alcohol  and or any Recreational drug use.   Wear Seat belts while driving.   Please note: You were cared for by a hospitalist during your hospital stay. Once you are discharged, your primary care physician will handle any further  medical issues. Please note that NO REFILLS for any discharge medications will be authorized once you are discharged, as it is imperative that you return to your primary care physician (or establish a relationship with a primary care physician if you do not have one) for your post hospital discharge needs so that they can reassess your need for medications and monitor your lab values  Time coordinating discharge: Over 30 minutes  SIGNED:   Hughie Closs, MD  [facility] 01/08/2023, 12:17 PM *Please note that this is a verbal dictation therefore any spelling or grammatical errors are due to the "[employer] One" system interpretation. If 7PM-7AM, please contact night-coverage [email or web address]

## 2023-01-08 NOTE — Hospital Course (Signed)
Daniel Bailey. is a 72 y.o. male with medical history significant for intellectual disability, recurrent aspiration s/p G-tube, chronic leukopenia, history of GI bleed, anemia, T2DM, hypothyroidism, tremors who is admitted with hypothermia.

## 2023-01-08 NOTE — Progress Notes (Signed)
PT Cancellation Note  Patient Details Name: Daniel Bailey. MRN: [id number] DOB: [birth year]   Cancelled Treatment:    Reason Eval/Treat Not Completed: Other (comment). Pt is from a SNF and is returning there via [facility] shortly. No need for PT eval at this time.   Angelina Ok [facility] 01/08/2023, 3:02 PM Skip Mayer PT Acute [employer] [phone]

## 2023-01-08 NOTE — ED Notes (Signed)
ED TO INPATIENT HANDOFF REPORT  ED Nurse Name and Phone #:  Marisue Ivan [phone]  A Name/Age/Gender Daniel Bailey. 72 y.o. male Room/Bed: 045C/045C  Code Status   Code Status: Limited: Do not attempt resuscitation (DNR) -DNR-LIMITED -Do Not Intubate/DNI   Home/SNF/Other Home Patient oriented to: self and place Is this baseline? Yes   Triage Complete: Triage complete  Chief Complaint Hypothermia [T68.XXXA]  Triage Note Pt BIB [facility] from [facility] with c/o chest pain x 1.5 hours. Pt was given 1 SL nitroglycerin by facility staff. 324 mg ASA given by EMS. Denies nausea, vomiting, or shortness of breath. Lung sounds clear per EMS. GCS 14 at baseline.  EMS Vitals: HR 50 BP 132/70 O2 98% room air CBG 91 20 ga L wrist     Allergies No Known Allergies  Level of Care/Admitting Diagnosis ED Disposition     ED Disposition  Admit   Condition  --   Comment  Hospital Area: [facility] [[id number]]  Level of Care: Progressive [102]  Admit to Progressive based on following criteria: Other see comments  Comments: Hypothermia on bair hugger  May place patient in observation at [facility] or [facility] if equivalent level of care is available:: No  Covid Evaluation: Asymptomatic - no recent exposure (last 10 days) testing not required  Diagnosis: Hypothermia [213086]  Admitting Physician: Charlsie Quest [[id number]]  Attending Physician: Charlsie Quest [[id number]]          B Medical/Surgery History Past Medical History:  Diagnosis Date   Acute encephalopathy 09/02/2021   Anemia    Aspiration pneumonia (HCC)    Colon cancer screening 08/18/2011   Elevated PSA    Frequent urination    Glaucoma    History of COVID-19    Hyperglycemia    Hyperlipidemia    Hypertension    Sleep apnea    Past Surgical History:  Procedure Laterality Date   BIOPSY  04/20/2022   Procedure: BIOPSY;  Surgeon: Beverley Fiedler, MD;  Location: [facility] ENDOSCOPY;  Service: Gastroenterology;;    COLONOSCOPY     ESOPHAGOGASTRODUODENOSCOPY (EGD) WITH PROPOFOL N/A 04/20/2022   Procedure: ESOPHAGOGASTRODUODENOSCOPY (EGD) WITH PROPOFOL;  Surgeon: Beverley Fiedler, MD;  Location: [facility] ENDOSCOPY;  Service: Gastroenterology;  Laterality: N/A;   IR GASTROSTOMY TUBE MOD SED  11/10/2021   PROSTATE BIOPSY N/A 11/20/2017   Procedure: BIOPSY TRANSRECTAL ULTRASONIC PROSTATE (TUBP);  Surgeon: Riki Altes, MD;  Location: [facility] ORS;  Service: Urology;  Laterality: N/A;     A IV Location/Drains/Wounds Patient Lines/Drains/Airways Status     Active Line/Drains/Airways     Name Placement date Placement time Site Days   Peripheral IV 01/07/23 20 G Left Wrist 01/07/23  1904  Wrist  1   Peripheral IV 01/07/23 18 G 1.16" Anterior;Right Forearm 01/07/23  1945  Forearm  1   Gastrostomy/Enterostomy Percutaneous endoscopic gastrostomy (PEG) 20 Fr. RUQ --  --  RUQ  --            Intake/Output Last 24 hours No intake or output data in the 24 hours ending 01/08/23 1135  Labs/Imaging Results for orders placed or performed during the hospital encounter of 01/07/23 (from the past 48 hour(s))  CBC with Differential     Status: Abnormal   Collection Time: 01/07/23  7:47 PM  Result Value Ref Range   WBC 3.3 (L) 4.0 - 10.5 K/uL   RBC 3.13 (L) 4.22 - 5.81 MIL/uL   Hemoglobin 10.0 (L) 13.0 -  17.0 g/dL   HCT 81.1 (L) 91.4 - 78.2 %   MCV 98.7 80.0 - 100.0 fL   MCH 31.9 26.0 - 34.0 pg   MCHC 32.4 30.0 - 36.0 g/dL   RDW 95.6 (H) 21.3 - 08.6 %   Platelets 184 150 - 400 K/uL   nRBC 0.0 0.0 - 0.2 %   Neutrophils Relative % 57 %   Neutro Abs 1.9 1.7 - 7.7 K/uL   Lymphocytes Relative 35 %   Lymphs Abs 1.1 0.7 - 4.0 K/uL   Monocytes Relative 6 %   Monocytes Absolute 0.2 0.1 - 1.0 K/uL   Eosinophils Relative 2 %   Eosinophils Absolute 0.1 0.0 - 0.5 K/uL   Basophils Relative 0 %   Basophils Absolute 0.0 0.0 - 0.1 K/uL   Immature Granulocytes 0 %   Abs Immature Granulocytes 0.01 0.00 - 0.07 K/uL    Comment:  Performed at [facility], [address] [place], [place] [address]  Troponin I (High Sensitivity)     Status: None   Collection Time: 01/07/23  7:47 PM  Result Value Ref Range   Troponin I (High Sensitivity) 7 <18 ng/L    Comment: (NOTE) Elevated high sensitivity troponin I (hsTnI) values and significant  changes across serial measurements may suggest ACS but many other  chronic and acute conditions are known to elevate hsTnI results.  Refer to the "Links" section for chest pain algorithms and additional  guidance. Performed at [facility], [address], [place], [place] [address]   Culture, blood (routine x 2)     Status: None (Preliminary result)   Collection Time: 01/07/23  7:47 PM   Specimen: BLOOD RIGHT ARM  Result Value Ref Range   Specimen Description BLOOD RIGHT ARM    Special Requests      BOTTLES DRAWN AEROBIC AND ANAEROBIC Blood Culture results may not be optimal due to an excessive volume of blood received in culture bottles   Culture      NO GROWTH < 12 HOURS Performed at [facility], [address] [place], [place] [address]    Report Status PENDING   Culture, blood (routine x 2)     Status: None (Preliminary result)   Collection Time: 01/07/23  7:47 PM   Specimen: BLOOD  Result Value Ref Range   Specimen Description BLOOD SITE NOT SPECIFIED    Special Requests      BOTTLES DRAWN AEROBIC AND ANAEROBIC Blood Culture adequate volume   Culture      NO GROWTH < 12 HOURS Performed at [facility], [address] [place], [place] [address]    Report Status PENDING   Comprehensive metabolic panel     Status: Abnormal   Collection Time: 01/07/23  7:47 PM  Result Value Ref Range   Sodium 134 (L) 135 - 145 mmol/L   Potassium 4.9 3.5 - 5.1 mmol/L   Chloride 101 98 - 111 mmol/L   CO2 21 (L) 22 - 32 mmol/L   Glucose, Bld 95 70 - 99 mg/dL    Comment: Glucose reference range applies only to samples taken after fasting for at least 8 hours.    BUN 33 (H) 8 - 23 mg/dL   Creatinine, Ser 0.27 0.61 - 1.24 mg/dL   Calcium 8.8 (L) 8.9 - 10.3 mg/dL   Total Protein 7.6 6.5 - 8.1 g/dL   Albumin 2.9 (L) 3.5 - 5.0 g/dL   AST 29 15 -  41 U/L   ALT 21 0 - 44 U/L   Alkaline Phosphatase 151 (H) 38 - 126 U/L   Total Bilirubin 0.6 0.3 - 1.2 mg/dL   GFR, Estimated >16 >10 mL/min    Comment: (NOTE) Calculated using the CKD-EPI Creatinine Equation (2021)    Anion gap 12 5 - 15    Comment: Performed at [facility], [address], [place], [place] [address]  TSH     Status: Abnormal   Collection Time: 01/07/23  7:47 PM  Result Value Ref Range   TSH 5.716 (H) 0.350 - 4.500 uIU/mL    Comment: Performed by a 3rd Generation assay with a functional sensitivity of <=0.01 uIU/mL. Performed at [facility], [address] [place], [place] [address]   I-Stat Lactic Acid     Status: None   Collection Time: 01/07/23  7:54 PM  Result Value Ref Range   Lactic Acid, Venous 0.9 0.5 - 1.9 mmol/L  Urinalysis, Routine w reflex microscopic -Urine, Clean Catch     Status: None   Collection Time: 01/07/23  8:00 PM  Result Value Ref Range   Color, Urine YELLOW YELLOW   APPearance CLEAR CLEAR   Specific Gravity, Urine 1.009 1.005 - 1.030   pH 7.0 5.0 - 8.0   Glucose, UA NEGATIVE NEGATIVE mg/dL   Hgb urine dipstick NEGATIVE NEGATIVE   Bilirubin Urine NEGATIVE NEGATIVE   Ketones, ur NEGATIVE NEGATIVE mg/dL   Protein, ur NEGATIVE NEGATIVE mg/dL   Nitrite NEGATIVE NEGATIVE   Leukocytes,Ua NEGATIVE NEGATIVE    Comment: Performed at [facility], [address] [place], [place] [address]  Troponin I (High Sensitivity)     Status: None   Collection Time: 01/07/23  9:32 PM  Result Value Ref Range   Troponin I (High Sensitivity) 7 <18 ng/L    Comment: (NOTE) Elevated high sensitivity troponin I (hsTnI) values and significant  changes across serial measurements may suggest ACS but many other  chronic and acute conditions are known to  elevate hsTnI results.  Refer to the "Links" section for chest pain algorithms and additional  guidance. Performed at [facility], [address], [place], [place] [address]   I-Stat Lactic Acid     Status: None   Collection Time: 01/07/23 10:12 PM  Result Value Ref Range   Lactic Acid, Venous 1.6 0.5 - 1.9 mmol/L  Basic metabolic panel     Status: Abnormal   Collection Time: 01/08/23  3:51 AM  Result Value Ref Range   Sodium 134 (L) 135 - 145 mmol/L   Potassium 5.0 3.5 - 5.1 mmol/L   Chloride 101 98 - 111 mmol/L   CO2 23 22 - 32 mmol/L   Glucose, Bld 86 70 - 99 mg/dL    Comment: Glucose reference range applies only to samples taken after fasting for at least 8 hours.   BUN 33 (H) 8 - 23 mg/dL   Creatinine, Ser 2.13 0.61 - 1.24 mg/dL   Calcium 9.3 8.9 - 08.6 mg/dL   GFR, Estimated >57 >84 mL/min    Comment: (NOTE) Calculated using the CKD-EPI Creatinine Equation (2021)    Anion gap 10 5 - 15    Comment: Performed at [facility], [address], [place], [place] [address]  CBC     Status: Abnormal   Collection Time: 01/08/23  3:51 AM  Result Value Ref Range   WBC 3.4 (L) 4.0 - 10.5 K/uL  RBC 3.29 (L) 4.22 - 5.81 MIL/uL   Hemoglobin 10.6 (L) 13.0 - 17.0 g/dL   HCT 16.1 (L) 09.6 - 04.5 %   MCV 97.3 80.0 - 100.0 fL   MCH 32.2 26.0 - 34.0 pg   MCHC 33.1 30.0 - 36.0 g/dL   RDW 40.9 (H) 81.1 - 91.4 %   Platelets 243 150 - 400 K/uL   nRBC 0.0 0.0 - 0.2 %    Comment: Performed at [facility], [address], [place], [place] [address]  Cortisol-am, blood     Status: None   Collection Time: 01/08/23  3:51 AM  Result Value Ref Range   Cortisol - AM 21.4 6.7 - 22.6 ug/dL    Comment: Performed at [facility], [address] [place], [place] [address]   DG Chest Port 1 View  Result Date: 01/07/2023 CLINICAL DATA:  Chest pain for 1.5 hours EXAM: PORTABLE CHEST 1 VIEW COMPARISON:  12/12/2022 FINDINGS: The heart size and mediastinal contours are within  normal limits. Both lungs are clear. The visualized skeletal structures are unremarkable. IMPRESSION: No active disease. Electronically Signed   By: Sharlet Salina M.D.   On: 01/07/2023 20:01    Pending Labs Unresulted Labs (From admission, onward)    None       Vitals/Pain Today's Vitals   01/08/23 0833 01/08/23 0903 01/08/23 1000 01/08/23 1105  BP: 124/69  112/61 105/62  Pulse: (!) 59   75  Resp: 17   17  Temp:  (!) 97.4 F (36.3 C)  97.6 F (36.4 C)  TempSrc:  Oral  Oral  SpO2:    99%  Weight:      Height:      PainSc:        Isolation Precautions No active isolations  Medications Medications  enoxaparin (LOVENOX) injection 40 mg (has no administration in time range)  acetaminophen (TYLENOL) tablet 650 mg (has no administration in time range)    Or  acetaminophen (TYLENOL) suppository 650 mg (has no administration in time range)  ondansetron (ZOFRAN) tablet 4 mg ( Oral See Alternative 01/08/23 0316)    Or  ondansetron (ZOFRAN) injection 4 mg (4 mg Intravenous Given 01/08/23 0316)  senna-docusate (Senokot-S) tablet 1 tablet (has no administration in time range)  ferrous sulfate 300 (60 Fe) MG/5ML syrup 300 mg (300 mg Per Tube Given 01/08/23 1006)  loratadine (CLARITIN) tablet 10 mg (10 mg Per Tube Given 01/08/23 1006)  magnesium hydroxide (MILK OF MAGNESIA) suspension 30 mL (has no administration in time range)  feeding supplement (JEVITY 1.5 CAL/FIBER) liquid 237 mL (237 mLs Per Tube Given 01/08/23 1006)  free water 225 mL (225 mLs Per Tube Given 01/08/23 0714)  LORazepam (ATIVAN) injection 1 mg (1 mg Intravenous Given 01/08/23 0347)  levothyroxine (SYNTHROID) tablet 75 mcg (has no administration in time range)  alum & mag hydroxide-simeth (MAALOX/MYLANTA) 200-200-20 MG/5ML suspension 30 mL (30 mLs Oral Given 01/07/23 2046)    Mobility non-ambulatory     Focused Assessments Cardiac Assessment Handoff:  Cardiac Rhythm: Sinus bradycardia No results found for: "CKTOTAL",  "CKMB", "CKMBINDEX", "TROPONINI" No results found for: "DDIMER" Does the Patient currently have chest pain? No    R Recommendations: See Admitting Provider Note  Report given to:   Additional Notes:

## 2023-01-08 NOTE — ED Notes (Signed)
Cousin/Legal Guardian Donah Driver [phone] would like a call when patient is picked up to home/facility

## 2023-01-08 NOTE — ED Notes (Signed)
The social worker was contacted at the request of the [facility] nurse.  Upon speaking with the social worker, Ms. Wendy Poet, she stated that the patient was being admitted and the consult would be handled on the floor.

## 2023-01-08 NOTE — Evaluation (Signed)
Occupational Therapy Evaluation Patient Details Name: Daniel Bailey. MRN: [id number] DOB: May 04, 1951 Today's Date: 01/08/2023   History of Present Illness Pt is a 72 y/o M presenting to ED on 9/1 from [facility] with chest pain x1.5 hours. CXR negative for focal consolidation, edema, or effusion. Admitted for hypothermia. PMH includes ID, recurrent aspiration s/p G tube, chronic leukopenia, GI bleed, andemia, hypothyroidism, tremors   Clinical Impression   Pt from [facility], reports having assist at baseline for some ADLs, transfers to w/c for mobility with the assist of staff. Pt currently needing min-max A for ADLs, max A for bed mobility, with strong posterior lean when sitting EOB. Able to roll R/L on stretcher for bed linen change with max A. Pt with decr vision, reports is baseline, able to detect raised area on call bell to call nurse if needed. Pt presenting with impairments listed below, will follow acutely. Patient will benefit from continued inpatient follow up therapy, <3 hours/day to maximize safety/ind with ADLs/functional mobility.        If plan is discharge home, recommend the following: Two people to help with walking and/or transfers;A lot of help with bathing/dressing/bathroom;Assistance with cooking/housework;Direct supervision/assist for medications management;Direct supervision/assist for financial management;Assist for transportation;Help with stairs or ramp for entrance    Functional Status Assessment  Patient has had a recent decline in their functional status and demonstrates the ability to make significant improvements in function in a reasonable and predictable amount of time.  Equipment Recommendations  Other (comment) (defer)    Recommendations for Other Services PT consult     Precautions / Restrictions Precautions Precautions: Fall Precaution Comments: g tube      Mobility Bed Mobility Overal bed mobility: Needs Assistance Bed Mobility:  Supine to Sit, Sit to Supine, Rolling Rolling: Max assist   Supine to sit: Max assist Sit to supine: Max assist   General bed mobility comments: leaning posteriorly sitting EOB    Transfers                   General transfer comment: deferred for safety      Balance Overall balance assessment: Needs assistance Sitting-balance support: Feet unsupported (stretcher too high) Sitting balance-Leahy Scale: Poor Sitting balance - Comments: strong posterior lean, reluctant to scoot to EOB,fearful                                   ADL either performed or assessed with clinical judgement   ADL Overall ADL's : Needs assistance/impaired Eating/Feeding: NPO   Grooming: Set up   Upper Body Bathing: Moderate assistance   Lower Body Bathing: Maximal assistance   Upper Body Dressing : Moderate assistance   Lower Body Dressing: Maximal assistance   Toilet Transfer: Maximal assistance;+2 for physical assistance   Toileting- Clothing Manipulation and Hygiene: Maximal assistance       Functional mobility during ADLs: Maximal assistance;+2 for physical assistance       Vision Ability to See in Adequate Light: 3 Highly impaired Patient Visual Report: No change from baseline Additional Comments: pt unable to read clock or identify number held up in front of pt, pt reports his "vision is bad" but does not clarify     Perception Perception: Not tested       Praxis Praxis: Not tested       Pertinent Vitals/Pain Pain Assessment Pain Assessment: Faces Pain Score: 3  Faces  Pain Scale: Hurts little more Pain Location: generalized with bed mobility Pain Descriptors / Indicators: Discomfort Pain Intervention(s): Limited activity within patient's tolerance, Monitored during session, Repositioned     Extremity/Trunk Assessment Upper Extremity Assessment Upper Extremity Assessment: Generalized weakness   Lower Extremity Assessment Lower Extremity  Assessment: Defer to PT evaluation       Communication Communication Communication: Difficulty communicating thoughts/reduced clarity of speech   Cognition Arousal: Alert Behavior During Therapy: WFL for tasks assessed/performed, Anxious                                   General Comments: anxious in regards to  mobility, able to recall name, and provides PLOF, disoriented to date     General Comments  VSS on RA    Exercises     Shoulder Instructions      Home Living Family/patient expects to be discharged to:: Skilled nursing facility                                 Additional Comments: [facility]      Prior Functioning/Environment Prior Level of Function : Independent/Modified Independent             Mobility Comments: pivots to w/c ADLs Comments: reports performing grooming tasks independently, staff assists with bathing, pericare, dressing        OT Problem List: Decreased strength;Decreased range of motion;Decreased activity tolerance;Impaired balance (sitting and/or standing);Decreased cognition;Decreased coordination;Decreased safety awareness      OT Treatment/Interventions: Self-care/ADL training;Therapeutic exercise;Energy conservation;DME and/or AE instruction;Therapeutic activities;Balance training;Patient/family education;Cognitive remediation/compensation;Visual/perceptual remediation/compensation    OT Goals(Current goals can be found in the care plan section) Acute Rehab OT Goals Patient Stated Goal: none stated OT Goal Formulation: With patient Time For Goal Achievement: 01/22/23 Potential to Achieve Goals: Good  OT Frequency: Min 1X/week    Co-evaluation              AM-PAC OT "6 Clicks" Daily Activity     Outcome Measure Help from another person eating meals?: Total Help from another person taking care of personal grooming?: A Little Help from another person toileting, which includes using toliet,  bedpan, or urinal?: A Lot Help from another person bathing (including washing, rinsing, drying)?: A Lot Help from another person to put on and taking off regular upper body clothing?: A Lot Help from another person to put on and taking off regular lower body clothing?: A Lot 6 Click Score: 12   End of Session Nurse Communication: Mobility status  Activity Tolerance: Patient tolerated treatment well Patient left: in bed;with call bell/phone within reach  OT Visit Diagnosis: Other abnormalities of gait and mobility (R26.89);Unsteadiness on feet (R26.81);Muscle weakness (generalized) (M62.81)                Time: 6433-2951 OT Time Calculation (min): 16 min Charges:  OT General Charges $OT Visit: 1 Visit OT Evaluation $OT Eval Low Complexity: 1 Low  Carver Fila, OTD, OTR/L SecureChat Preferred Acute Rehab [phone]   Carver Fila Koonce 01/08/2023, 12:35 PM

## 2023-01-08 NOTE — ED Notes (Signed)
Pt is a&o to his normal baseline. Pt is attached to monitor/vitals. No complaints at this time. Side rails up x 2, call light with patient. Pt is covered with blankets. Door open, light on.

## 2023-01-09 ENCOUNTER — Encounter (HOSPITAL_COMMUNITY): Payer: Self-pay

## 2023-01-09 ENCOUNTER — Emergency Department (HOSPITAL_COMMUNITY)
Admission: EM | Admit: 2023-01-09 | Discharge: 2023-01-09 | Disposition: A | Discharge status: Home / Self Care | Payer: Medicare Other | Attending: Emergency Medicine | Admitting: Emergency Medicine

## 2023-01-09 ENCOUNTER — Emergency Department (HOSPITAL_COMMUNITY): Payer: Medicare Other

## 2023-01-09 DIAGNOSIS — R1084 Generalized abdominal pain: Secondary | ICD-10-CM | POA: Diagnosis not present

## 2023-01-09 DIAGNOSIS — R1033 Periumbilical pain: Secondary | ICD-10-CM | POA: Insufficient documentation

## 2023-01-09 DIAGNOSIS — R1110 Vomiting, unspecified: Secondary | ICD-10-CM | POA: Diagnosis not present

## 2023-01-09 DIAGNOSIS — K4090 Unilateral inguinal hernia, without obstruction or gangrene, not specified as recurrent: Secondary | ICD-10-CM | POA: Diagnosis not present

## 2023-01-09 DIAGNOSIS — R52 Pain, unspecified: Secondary | ICD-10-CM

## 2023-01-09 DIAGNOSIS — M545 Low back pain, unspecified: Secondary | ICD-10-CM | POA: Diagnosis not present

## 2023-01-09 DIAGNOSIS — Z79899 Other long term (current) drug therapy: Secondary | ICD-10-CM | POA: Diagnosis not present

## 2023-01-09 DIAGNOSIS — Z7401 Bed confinement status: Secondary | ICD-10-CM | POA: Diagnosis not present

## 2023-01-09 DIAGNOSIS — R109 Unspecified abdominal pain: Secondary | ICD-10-CM | POA: Diagnosis not present

## 2023-01-09 DIAGNOSIS — M5459 Other low back pain: Secondary | ICD-10-CM | POA: Diagnosis not present

## 2023-01-09 DIAGNOSIS — R300 Dysuria: Secondary | ICD-10-CM | POA: Diagnosis not present

## 2023-01-09 DIAGNOSIS — I10 Essential (primary) hypertension: Secondary | ICD-10-CM | POA: Diagnosis not present

## 2023-01-09 DIAGNOSIS — R112 Nausea with vomiting, unspecified: Secondary | ICD-10-CM | POA: Diagnosis not present

## 2023-01-09 DIAGNOSIS — M549 Dorsalgia, unspecified: Secondary | ICD-10-CM | POA: Diagnosis not present

## 2023-01-09 DIAGNOSIS — K429 Umbilical hernia without obstruction or gangrene: Secondary | ICD-10-CM | POA: Diagnosis not present

## 2023-01-09 DIAGNOSIS — K7689 Other specified diseases of liver: Secondary | ICD-10-CM | POA: Diagnosis not present

## 2023-01-09 LAB — CBC WITH DIFFERENTIAL/PLATELET
Abs Immature Granulocytes: 0.03 10*3/uL (ref 0.00–0.07)
Basophils Absolute: 0 10*3/uL (ref 0.0–0.1)
Basophils Relative: 0 %
Eosinophils Absolute: 0 10*3/uL (ref 0.0–0.5)
Eosinophils Relative: 0 %
HCT: 32.7 % — ABNORMAL LOW (ref 39.0–52.0)
Hemoglobin: 10.4 g/dL — ABNORMAL LOW (ref 13.0–17.0)
Immature Granulocytes: 0 %
Lymphocytes Relative: 7 %
Lymphs Abs: 0.7 10*3/uL (ref 0.7–4.0)
MCH: 31.5 pg (ref 26.0–34.0)
MCHC: 31.8 g/dL (ref 30.0–36.0)
MCV: 99.1 fL (ref 80.0–100.0)
Monocytes Absolute: 0.4 10*3/uL (ref 0.1–1.0)
Monocytes Relative: 4 %
Neutro Abs: 8.4 10*3/uL — ABNORMAL HIGH (ref 1.7–7.7)
Neutrophils Relative %: 89 %
Platelets: 226 10*3/uL (ref 150–400)
RBC: 3.3 MIL/uL — ABNORMAL LOW (ref 4.22–5.81)
RDW: 17.9 % — ABNORMAL HIGH (ref 11.5–15.5)
WBC: 9.6 10*3/uL (ref 4.0–10.5)
nRBC: 0 % (ref 0.0–0.2)

## 2023-01-09 LAB — COMPREHENSIVE METABOLIC PANEL
ALT: 20 U/L (ref 0–44)
AST: 27 U/L (ref 15–41)
Albumin: 3.3 g/dL — ABNORMAL LOW (ref 3.5–5.0)
Alkaline Phosphatase: 158 U/L — ABNORMAL HIGH (ref 38–126)
Anion gap: 9 (ref 5–15)
BUN: 40 mg/dL — ABNORMAL HIGH (ref 8–23)
CO2: 25 mmol/L (ref 22–32)
Calcium: 9.2 mg/dL (ref 8.9–10.3)
Chloride: 102 mmol/L (ref 98–111)
Creatinine, Ser: 1.33 mg/dL — ABNORMAL HIGH (ref 0.61–1.24)
GFR, Estimated: 56 mL/min — ABNORMAL LOW (ref >60)
Glucose, Bld: 99 mg/dL (ref 70–99)
Potassium: 4.9 mmol/L (ref 3.5–5.1)
Sodium: 136 mmol/L (ref 135–145)
Total Bilirubin: 0.6 mg/dL (ref 0.3–1.2)
Total Protein: 8 g/dL (ref 6.5–8.1)

## 2023-01-09 MED ORDER — SODIUM CHLORIDE 0.9 % IV BOLUS
500.0000 mL | Freq: Once | INTRAVENOUS | Status: AC
  Administered 2023-01-09: 500 mL via INTRAVENOUS

## 2023-01-09 MED ORDER — IOHEXOL 300 MG/ML  SOLN
100.0000 mL | Freq: Once | INTRAMUSCULAR | Status: AC | PRN
  Administered 2023-01-09: 100 mL via INTRAVENOUS

## 2023-01-09 NOTE — Discharge Instructions (Signed)
As discussed, your evaluation today has been largely reassuring.  But, it is important that you monitor your condition carefully, and do not hesitate to return to the ED if you develop new, or concerning changes in your condition.  There are a few items that require follow-up.  You can discuss this with your physician, and information to follow-up directly with the urology and pulmonology teams as included above.

## 2023-01-09 NOTE — ED Provider Notes (Signed)
[facility]ENC[facility]MENT AT [facility] Provider Note   CSN: [id number] Arrival date & time: 01/09/23  1227     History  Chief Complaint  Patient presents with   Abdominal Pain    Daniel Bailey. is a 72 y.o. male.  HPI Patient presents from his facility with concern for abdominal pain.  Patient has cognitive impairment, level 5 caveat. Per EMS patient complained of abdominal pain to staff.  He has recently been evaluated for chest pain.  Patient denies vomiting, diarrhea, states that his abdomen hurts all over.    Home Medications Prior to Admission medications   Medication Sig Start Date End Date Taking? Authorizing Provider  acetaminophen (TYLENOL) 500 MG tablet Place 2 tablets (1,000 mg total) into feeding tube every 8 (eight) hours as needed for moderate pain. 11/11/21  Yes Burnadette Pop, MD  ferrous sulfate 220 (44 Fe) MG/5ML solution Place 7 mLs into feeding tube daily.   Yes [provider]  levothyroxine (SYNTHROID) 75 MCG tablet Take 1 tablet (75 mcg total) by mouth daily. Patient taking differently: Take 50 mcg by mouth daily. 01/08/23 02/07/23 Yes Pahwani, Daleen Bo, MD  loratadine (CLARITIN) 10 MG tablet Place 10 mg into feeding tube daily.   Yes [provider]  magnesium hydroxide (MILK OF MAGNESIA) 400 MG/5ML suspension Place 30 mLs into feeding tube 2 (two) times daily as needed for moderate constipation.   Yes [provider]  midodrine (PROAMATINE) 5 MG tablet Place 1 tablet (5 mg total) into feeding tube 3 (three) times daily with meals. Patient taking differently: Place 5 mg into feeding tube 3 (three) times daily with meals. For chronic hypotension 11/11/21  Yes Burnadette Pop, MD  Nutritional Supplements (FEEDING SUPPLEMENT, JEVITY 1.5 CAL/FIBER,) LIQD Place 237 mLs into feeding tube 5 (five) times daily.   Yes [provider]  OMEPRAZOLE PO 20 mg by Gastric Tube route 2 (two) times daily.   Yes [provider]  ondansetron (ZOFRAN-ODT) 4 MG disintegrating tablet 4 mg See admin instructions. Give 4 mg per tube three times daily as needed for nausea and vomiting 04/19/22  Yes [provider]  Water For Irrigation, Sterile (FREE WATER) SOLN Place 225 mLs into feeding tube every 6 (six) hours.   Yes [provider]      Allergies    Patient has no known allergies.    Review of Systems   Review of Systems  Unable to perform ROS: Other    Physical Exam Updated Vital Signs BP 115/72   Pulse 68   Temp (!) 97.5 F (36.4 C)   Resp 14   SpO2 96%  Physical Exam Vitals and nursing note reviewed.  Constitutional:      General: He is not in acute distress.    Appearance: He is well-developed.  HENT:     Head: Normocephalic and atraumatic.  Eyes:     Conjunctiva/sclera: Conjunctivae normal.  Cardiovascular:     Rate and Rhythm: Normal rate and regular rhythm.  Pulmonary:     Effort: Pulmonary effort is normal. No respiratory distress.     Breath sounds: No stridor.  Abdominal:     General: There is no distension.    Skin:    General: Skin is warm and dry.  Neurological:     Mental Status: He is alert.  Psychiatric:        Cognition and Memory: Cognition is impaired.     ED Results / Procedures / Treatments  Labs (all labs ordered are listed, but only abnormal results are displayed) Labs Reviewed  COMPREHENSIVE METABOLIC PANEL - Abnormal; Notable for the following components:      Result Value   BUN 40 (*)    Creatinine, Ser 1.33 (*)    Albumin 3.3 (*)    Alkaline Phosphatase 158 (*)    GFR, Estimated 56 (*)    All other components within normal limits  CBC WITH DIFFERENTIAL/PLATELET - Abnormal; Notable for the following components:   RBC 3.30 (*)    Hemoglobin 10.4 (*)    HCT 32.7 (*)    RDW 17.9 (*)    Neutro Abs 8.4 (*)    All other components within normal limits    EKG EKG Interpretation Date/Time:  Tuesday January 09 2023  12:57:55 EDT Ventricular Rate:  65 PR Interval:  209 QRS Duration:  160 QT Interval:  438 QTC Calculation: 456 R Axis:   92  Text Interpretation: Sinus rhythm RBBB and LPFB Lateral infarct, age indeterminate Artifact Confirmed by Gerhard Munch [phone]) on 01/09/2023 3:37:24 PM  Radiology CT ABDOMEN PELVIS W CONTRAST  Result Date: 01/09/2023 CLINICAL DATA:  Bowel obstruction suspected. Abdominal pain. Nausea/vomiting. EXAM: CT ABDOMEN AND PELVIS WITH CONTRAST TECHNIQUE: Multidetector CT imaging of the abdomen and pelvis was performed using the standard protocol following bolus administration of intravenous contrast. RADIATION DOSE REDUCTION: This exam was performed according to the departmental dose-optimization program which includes automated exposure control, adjustment of the mA and/or kV according to patient size and/or use of iterative reconstruction technique. CONTRAST:  OMNIPAQUE IOHEXOL 300 MG/ML  SOLN COMPARISON:  CT scan abdomen and pelvis from 04/19/2022. FINDINGS: Lower chest: There is smooth circumferential thickening of the segmental and subsegmental bronchi in the imaged bilateral lower lobes. There are additional filling defects in the left lower lobe bronchial tree and several ill-defined nodules in the left lung lower lobe with largest nodule measuring up to 10 x 11 mm. These are nonspecific and differential diagnosis includes aspiration pneumonia versus bronchopneumonia. Follow-up to clearing is recommended to exclude underlying neoplastic process. The lung bases are otherwise clear. No pleural effusion. The heart is normal in size. No pericardial effusion. Hepatobiliary: The liver is normal in size. Non-cirrhotic configuration. No suspicious mass. Note is made of 2 simple cysts in the liver with largest in the caudate lobe measuring up to 2.6 x 2.9 cm. No intrahepatic or extrahepatic bile duct dilation. Small volume layering calcified gallstones/sludge noted. No imaging signs of  acute cholecystitis. Pancreas: Unremarkable. No pancreatic ductal dilatation or surrounding inflammatory changes. Spleen: Within normal limits. No focal lesion. Adrenals/Urinary Tract: Adrenal glands are unremarkable. No suspicious renal mass. No hydronephrosis. No renal or ureteric calculi. There is asymmetric irregular thickening of the anterior superior bladder wall measuring up to 1.5 cm in thickness. No perivesical fat stranding. This is new since the prior CT scan from 04/19/2022. Further evaluation with cystoscopy/tissue sampling is recommended. No bladder calculi. Stomach/Bowel: Distal gastric G-tube noted inserted from the right paramedian epigastric approach. No disproportionate dilation of the small or large bowel loops. No evidence of abnormal bowel wall thickening or inflammatory changes. The appendix is unremarkable. Vascular/Lymphatic: No ascites or pneumoperitoneum. No abdominal or pelvic lymphadenopathy, by size criteria. No aneurysmal dilation of the major abdominal arteries. Reproductive: Normal size prostate. Symmetric seminal vesicles. Other: There is a small-to-moderate fat containing right paramedian umbilical hernia as well as small-to-moderate right fat containing inguinal hernia. There is tiny left inguinal hernia containing ascitic fluid. The  soft tissues and abdominal wall are otherwise unremarkable. Musculoskeletal: No suspicious osseous lesions. There are moderate multilevel degenerative changes in the visualized spine. IMPRESSION: 1. No evidence of bowel obstruction. 2. Asymmetric irregular thickening of the anterior superior bladder wall. Further evaluation with cystoscopy/tissue sampling is recommended. 3. Findings in the lung bases which may represent aspiration pneumonia versus bronchopneumonia. Follow-up to clearing is recommended to exclude underlying neoplastic process. 4. Multiple other nonacute observations, as described above. Electronically Signed   By: Jules Schick M.D.    On: 01/09/2023 16:39   DG Chest Port 1 View  Result Date: 01/07/2023 CLINICAL DATA:  Chest pain for 1.5 hours EXAM: PORTABLE CHEST 1 VIEW COMPARISON:  12/12/2022 FINDINGS: The heart size and mediastinal contours are within normal limits. Both lungs are clear. The visualized skeletal structures are unremarkable. IMPRESSION: No active disease. Electronically Signed   By: Sharlet Salina M.D.   On: 01/07/2023 20:01    Procedures Procedures    Medications Ordered in ED Medications  sodium chloride 0.9 % bolus 500 mL (500 mLs Intravenous New Bag/Given 01/09/23 1335)  iohexol (OMNIPAQUE) 300 MG/ML solution 100 mL (100 mLs Intravenous Contrast Given 01/09/23 1446)    ED Course/ Medical Decision Making/ A&P                                 Medical Decision Making Obese adult male with cognitive impairment presents from nursing facility with staff report of abdominal pain.  Patient awake, alert, hemodynamically unremarkable, does have a tender abdomen with a palpable hernia.  Concern for obstruction versus other intra-abdominal processes. Chart review notable for multiple recent ED visits for chest pain.   Amount and/or Complexity of Data Reviewed Independent Historian: EMS Labs: ordered. Decision-making details documented in ED Course. Radiology: ordered and independent interpretation performed. Decision-making details documented in ED Course.  Risk Prescription drug management. Decision regarding hospitalization. Diagnosis or treatment significantly limited by social determinants of health.   5:16 PM Patient calm, in no distress, no complaints.  He is now accompanied by a caregiver from his facility.  We discussed all findings, and she hypothesizes that the patient complained of pain due to positioning while here overnight 2 days ago. He has no current complaints as above is hemodynamically unremarkable, there is no obvious evidence for pneumonia, cystitis.  However, the patient is found to  have abnormalities on CT as he did a few days ago, and caregiver notes that she is comfortable facilitating outpatient follow-up for these.  Patient discharged in stable condition.        Final Clinical Impression(s) / ED Diagnoses Final diagnoses:  Pain    Rx / DC Orders ED Discharge Orders     None         Gerhard Munch, MD 01/09/23 [phone]

## 2023-01-09 NOTE — ED Notes (Signed)
This RN attempted report to [facility]. When asked for the name of person receiving report, person hung up phone.

## 2023-01-09 NOTE — ED Triage Notes (Signed)
BIB [facility] from [facility] C/o abdominal pain and back pain n/v that began 0930.

## 2023-01-10 DIAGNOSIS — M5459 Other low back pain: Secondary | ICD-10-CM | POA: Diagnosis not present

## 2023-01-10 DIAGNOSIS — R1110 Vomiting, unspecified: Secondary | ICD-10-CM | POA: Diagnosis not present

## 2023-01-12 LAB — CULTURE, BLOOD (ROUTINE X 2)
Culture: NO GROWTH
Culture: NO GROWTH
Special Requests: ADEQUATE

## 2023-01-14 DIAGNOSIS — R41841 Cognitive communication deficit: Secondary | ICD-10-CM | POA: Diagnosis not present

## 2023-01-14 DIAGNOSIS — E1169 Type 2 diabetes mellitus with other specified complication: Secondary | ICD-10-CM | POA: Diagnosis not present

## 2023-01-14 DIAGNOSIS — R1312 Dysphagia, oropharyngeal phase: Secondary | ICD-10-CM | POA: Diagnosis not present

## 2023-01-15 DIAGNOSIS — R0789 Other chest pain: Secondary | ICD-10-CM | POA: Diagnosis not present

## 2023-01-15 DIAGNOSIS — R001 Bradycardia, unspecified: Secondary | ICD-10-CM | POA: Diagnosis not present

## 2023-01-16 DIAGNOSIS — E038 Other specified hypothyroidism: Secondary | ICD-10-CM | POA: Diagnosis not present

## 2023-01-16 DIAGNOSIS — D638 Anemia in other chronic diseases classified elsewhere: Secondary | ICD-10-CM | POA: Diagnosis not present

## 2023-01-16 DIAGNOSIS — E1122 Type 2 diabetes mellitus with diabetic chronic kidney disease: Secondary | ICD-10-CM | POA: Diagnosis not present

## 2023-01-16 DIAGNOSIS — K2100 Gastro-esophageal reflux disease with esophagitis, without bleeding: Secondary | ICD-10-CM | POA: Diagnosis not present

## 2023-01-16 DIAGNOSIS — I10 Essential (primary) hypertension: Secondary | ICD-10-CM | POA: Diagnosis not present

## 2023-01-16 DIAGNOSIS — M6281 Muscle weakness (generalized): Secondary | ICD-10-CM | POA: Diagnosis not present

## 2023-01-16 DIAGNOSIS — N182 Chronic kidney disease, stage 2 (mild): Secondary | ICD-10-CM | POA: Diagnosis not present

## 2023-01-16 DIAGNOSIS — R001 Bradycardia, unspecified: Secondary | ICD-10-CM | POA: Diagnosis not present

## 2023-01-16 DIAGNOSIS — N390 Urinary tract infection, site not specified: Secondary | ICD-10-CM | POA: Diagnosis not present

## 2023-01-17 DIAGNOSIS — E1169 Type 2 diabetes mellitus with other specified complication: Secondary | ICD-10-CM | POA: Diagnosis not present

## 2023-01-17 DIAGNOSIS — R1312 Dysphagia, oropharyngeal phase: Secondary | ICD-10-CM | POA: Diagnosis not present

## 2023-01-17 DIAGNOSIS — R41841 Cognitive communication deficit: Secondary | ICD-10-CM | POA: Diagnosis not present

## 2023-01-18 DIAGNOSIS — N182 Chronic kidney disease, stage 2 (mild): Secondary | ICD-10-CM | POA: Diagnosis not present

## 2023-01-18 DIAGNOSIS — J188 Other pneumonia, unspecified organism: Secondary | ICD-10-CM | POA: Diagnosis not present

## 2023-01-18 DIAGNOSIS — R1312 Dysphagia, oropharyngeal phase: Secondary | ICD-10-CM | POA: Diagnosis not present

## 2023-01-18 DIAGNOSIS — R001 Bradycardia, unspecified: Secondary | ICD-10-CM | POA: Diagnosis not present

## 2023-01-18 DIAGNOSIS — K2100 Gastro-esophageal reflux disease with esophagitis, without bleeding: Secondary | ICD-10-CM | POA: Diagnosis not present

## 2023-01-18 DIAGNOSIS — E1122 Type 2 diabetes mellitus with diabetic chronic kidney disease: Secondary | ICD-10-CM | POA: Diagnosis not present

## 2023-01-18 DIAGNOSIS — M6281 Muscle weakness (generalized): Secondary | ICD-10-CM | POA: Diagnosis not present

## 2023-01-18 DIAGNOSIS — D638 Anemia in other chronic diseases classified elsewhere: Secondary | ICD-10-CM | POA: Diagnosis not present

## 2023-01-18 DIAGNOSIS — E1169 Type 2 diabetes mellitus with other specified complication: Secondary | ICD-10-CM | POA: Diagnosis not present

## 2023-01-18 DIAGNOSIS — E038 Other specified hypothyroidism: Secondary | ICD-10-CM | POA: Diagnosis not present

## 2023-01-18 DIAGNOSIS — R41841 Cognitive communication deficit: Secondary | ICD-10-CM | POA: Diagnosis not present

## 2023-01-20 DIAGNOSIS — R41841 Cognitive communication deficit: Secondary | ICD-10-CM | POA: Diagnosis not present

## 2023-01-20 DIAGNOSIS — R1312 Dysphagia, oropharyngeal phase: Secondary | ICD-10-CM | POA: Diagnosis not present

## 2023-01-20 DIAGNOSIS — E1169 Type 2 diabetes mellitus with other specified complication: Secondary | ICD-10-CM | POA: Diagnosis not present

## 2023-01-24 DIAGNOSIS — E1122 Type 2 diabetes mellitus with diabetic chronic kidney disease: Secondary | ICD-10-CM | POA: Diagnosis not present

## 2023-01-24 DIAGNOSIS — E1169 Type 2 diabetes mellitus with other specified complication: Secondary | ICD-10-CM | POA: Diagnosis not present

## 2023-01-24 DIAGNOSIS — R41841 Cognitive communication deficit: Secondary | ICD-10-CM | POA: Diagnosis not present

## 2023-01-24 DIAGNOSIS — D508 Other iron deficiency anemias: Secondary | ICD-10-CM | POA: Diagnosis not present

## 2023-01-24 DIAGNOSIS — R1312 Dysphagia, oropharyngeal phase: Secondary | ICD-10-CM | POA: Diagnosis not present

## 2023-01-25 DIAGNOSIS — E1169 Type 2 diabetes mellitus with other specified complication: Secondary | ICD-10-CM | POA: Diagnosis not present

## 2023-01-25 DIAGNOSIS — R1312 Dysphagia, oropharyngeal phase: Secondary | ICD-10-CM | POA: Diagnosis not present

## 2023-01-25 DIAGNOSIS — R41841 Cognitive communication deficit: Secondary | ICD-10-CM | POA: Diagnosis not present

## 2023-01-26 ENCOUNTER — Telehealth: Payer: Self-pay

## 2023-01-26 DIAGNOSIS — R1312 Dysphagia, oropharyngeal phase: Secondary | ICD-10-CM | POA: Diagnosis not present

## 2023-01-26 DIAGNOSIS — R41841 Cognitive communication deficit: Secondary | ICD-10-CM | POA: Diagnosis not present

## 2023-01-26 DIAGNOSIS — E1169 Type 2 diabetes mellitus with other specified complication: Secondary | ICD-10-CM | POA: Diagnosis not present

## 2023-01-26 NOTE — Telephone Encounter (Signed)
Transition Care Management Unsuccessful Follow-up Telephone Call  Date of discharge and from where:  [facility] 9/3  Attempts:  1st Attempt  Reason for unsuccessful TCM follow-up call:  No answer/busy   Lenard Forth [place]  [facility] Guide, Phone: [phone] Website: [email or web address]

## 2023-01-28 DIAGNOSIS — R1312 Dysphagia, oropharyngeal phase: Secondary | ICD-10-CM | POA: Diagnosis not present

## 2023-01-28 DIAGNOSIS — R41841 Cognitive communication deficit: Secondary | ICD-10-CM | POA: Diagnosis not present

## 2023-01-28 DIAGNOSIS — E1169 Type 2 diabetes mellitus with other specified complication: Secondary | ICD-10-CM | POA: Diagnosis not present

## 2023-01-29 ENCOUNTER — Telehealth: Payer: Self-pay

## 2023-01-29 DIAGNOSIS — E1169 Type 2 diabetes mellitus with other specified complication: Secondary | ICD-10-CM | POA: Diagnosis not present

## 2023-01-29 DIAGNOSIS — R41841 Cognitive communication deficit: Secondary | ICD-10-CM | POA: Diagnosis not present

## 2023-01-29 DIAGNOSIS — R1312 Dysphagia, oropharyngeal phase: Secondary | ICD-10-CM | POA: Diagnosis not present

## 2023-01-29 DIAGNOSIS — R0789 Other chest pain: Secondary | ICD-10-CM | POA: Diagnosis not present

## 2023-01-29 DIAGNOSIS — R001 Bradycardia, unspecified: Secondary | ICD-10-CM | POA: Diagnosis not present

## 2023-01-29 NOTE — Telephone Encounter (Signed)
Transition Care Management Unsuccessful Follow-up Telephone Call  Date of discharge and from where:  [facility] 9/3  Attempts:  2nd Attempt  Reason for unsuccessful TCM follow-up call:  No answer/busy   Lenard Forth [place]  [facility], [facility] Guide, Phone: [phone] Website: [email or web address]

## 2023-01-30 DIAGNOSIS — E1169 Type 2 diabetes mellitus with other specified complication: Secondary | ICD-10-CM | POA: Diagnosis not present

## 2023-01-30 DIAGNOSIS — R41841 Cognitive communication deficit: Secondary | ICD-10-CM | POA: Diagnosis not present

## 2023-01-30 DIAGNOSIS — R1312 Dysphagia, oropharyngeal phase: Secondary | ICD-10-CM | POA: Diagnosis not present

## 2023-01-31 DIAGNOSIS — H2513 Age-related nuclear cataract, bilateral: Secondary | ICD-10-CM | POA: Diagnosis not present

## 2023-02-06 DIAGNOSIS — Z23 Encounter for immunization: Secondary | ICD-10-CM | POA: Diagnosis not present

## 2023-02-06 DIAGNOSIS — Z01818 Encounter for other preprocedural examination: Secondary | ICD-10-CM | POA: Diagnosis not present

## 2023-02-06 DIAGNOSIS — I428 Other cardiomyopathies: Secondary | ICD-10-CM | POA: Diagnosis not present

## 2023-02-07 DIAGNOSIS — H25813 Combined forms of age-related cataract, bilateral: Secondary | ICD-10-CM | POA: Diagnosis not present

## 2023-02-07 DIAGNOSIS — R41841 Cognitive communication deficit: Secondary | ICD-10-CM | POA: Diagnosis not present

## 2023-02-07 DIAGNOSIS — R1312 Dysphagia, oropharyngeal phase: Secondary | ICD-10-CM | POA: Diagnosis not present

## 2023-02-08 DIAGNOSIS — R41841 Cognitive communication deficit: Secondary | ICD-10-CM | POA: Diagnosis not present

## 2023-02-08 DIAGNOSIS — E1122 Type 2 diabetes mellitus with diabetic chronic kidney disease: Secondary | ICD-10-CM | POA: Diagnosis not present

## 2023-02-08 DIAGNOSIS — M6281 Muscle weakness (generalized): Secondary | ICD-10-CM | POA: Diagnosis not present

## 2023-02-08 DIAGNOSIS — R001 Bradycardia, unspecified: Secondary | ICD-10-CM | POA: Diagnosis not present

## 2023-02-08 DIAGNOSIS — K219 Gastro-esophageal reflux disease without esophagitis: Secondary | ICD-10-CM | POA: Diagnosis not present

## 2023-02-08 DIAGNOSIS — N182 Chronic kidney disease, stage 2 (mild): Secondary | ICD-10-CM | POA: Diagnosis not present

## 2023-02-08 DIAGNOSIS — R1312 Dysphagia, oropharyngeal phase: Secondary | ICD-10-CM | POA: Diagnosis not present

## 2023-02-11 DIAGNOSIS — R1312 Dysphagia, oropharyngeal phase: Secondary | ICD-10-CM | POA: Diagnosis not present

## 2023-02-11 DIAGNOSIS — R41841 Cognitive communication deficit: Secondary | ICD-10-CM | POA: Diagnosis not present

## 2023-02-12 DIAGNOSIS — R41841 Cognitive communication deficit: Secondary | ICD-10-CM | POA: Diagnosis not present

## 2023-02-12 DIAGNOSIS — R1312 Dysphagia, oropharyngeal phase: Secondary | ICD-10-CM | POA: Diagnosis not present

## 2023-02-13 DIAGNOSIS — R41841 Cognitive communication deficit: Secondary | ICD-10-CM | POA: Diagnosis not present

## 2023-02-13 DIAGNOSIS — D638 Anemia in other chronic diseases classified elsewhere: Secondary | ICD-10-CM | POA: Diagnosis not present

## 2023-02-13 DIAGNOSIS — R1312 Dysphagia, oropharyngeal phase: Secondary | ICD-10-CM | POA: Diagnosis not present

## 2023-02-13 DIAGNOSIS — R001 Bradycardia, unspecified: Secondary | ICD-10-CM | POA: Diagnosis not present

## 2023-02-13 DIAGNOSIS — N182 Chronic kidney disease, stage 2 (mild): Secondary | ICD-10-CM | POA: Diagnosis not present

## 2023-02-13 DIAGNOSIS — E038 Other specified hypothyroidism: Secondary | ICD-10-CM | POA: Diagnosis not present

## 2023-02-13 DIAGNOSIS — E1122 Type 2 diabetes mellitus with diabetic chronic kidney disease: Secondary | ICD-10-CM | POA: Diagnosis not present

## 2023-02-13 DIAGNOSIS — K219 Gastro-esophageal reflux disease without esophagitis: Secondary | ICD-10-CM | POA: Diagnosis not present

## 2023-02-14 DIAGNOSIS — R1312 Dysphagia, oropharyngeal phase: Secondary | ICD-10-CM | POA: Diagnosis not present

## 2023-02-14 DIAGNOSIS — Z23 Encounter for immunization: Secondary | ICD-10-CM | POA: Diagnosis not present

## 2023-02-14 DIAGNOSIS — R41841 Cognitive communication deficit: Secondary | ICD-10-CM | POA: Diagnosis not present

## 2023-02-15 DIAGNOSIS — I10 Essential (primary) hypertension: Secondary | ICD-10-CM | POA: Diagnosis not present

## 2023-02-15 DIAGNOSIS — E119 Type 2 diabetes mellitus without complications: Secondary | ICD-10-CM | POA: Diagnosis not present

## 2023-02-16 DIAGNOSIS — R0989 Other specified symptoms and signs involving the circulatory and respiratory systems: Secondary | ICD-10-CM | POA: Diagnosis not present

## 2023-02-16 DIAGNOSIS — R059 Cough, unspecified: Secondary | ICD-10-CM | POA: Diagnosis not present

## 2023-02-19 DIAGNOSIS — R1312 Dysphagia, oropharyngeal phase: Secondary | ICD-10-CM | POA: Diagnosis not present

## 2023-02-19 DIAGNOSIS — R41841 Cognitive communication deficit: Secondary | ICD-10-CM | POA: Diagnosis not present

## 2023-02-20 DIAGNOSIS — R1312 Dysphagia, oropharyngeal phase: Secondary | ICD-10-CM | POA: Diagnosis not present

## 2023-02-20 DIAGNOSIS — R41841 Cognitive communication deficit: Secondary | ICD-10-CM | POA: Diagnosis not present

## 2023-02-20 DIAGNOSIS — I428 Other cardiomyopathies: Secondary | ICD-10-CM | POA: Diagnosis not present

## 2023-02-22 DIAGNOSIS — D508 Other iron deficiency anemias: Secondary | ICD-10-CM | POA: Diagnosis not present

## 2023-02-22 DIAGNOSIS — R41841 Cognitive communication deficit: Secondary | ICD-10-CM | POA: Diagnosis not present

## 2023-02-22 DIAGNOSIS — R1312 Dysphagia, oropharyngeal phase: Secondary | ICD-10-CM | POA: Diagnosis not present

## 2023-02-22 DIAGNOSIS — E1122 Type 2 diabetes mellitus with diabetic chronic kidney disease: Secondary | ICD-10-CM | POA: Diagnosis not present

## 2023-02-27 DIAGNOSIS — I10 Essential (primary) hypertension: Secondary | ICD-10-CM | POA: Diagnosis not present

## 2023-02-27 DIAGNOSIS — H25813 Combined forms of age-related cataract, bilateral: Secondary | ICD-10-CM | POA: Diagnosis not present

## 2023-02-27 DIAGNOSIS — I517 Cardiomegaly: Secondary | ICD-10-CM | POA: Diagnosis not present

## 2023-02-27 DIAGNOSIS — G4733 Obstructive sleep apnea (adult) (pediatric): Secondary | ICD-10-CM | POA: Diagnosis not present

## 2023-02-27 DIAGNOSIS — E119 Type 2 diabetes mellitus without complications: Secondary | ICD-10-CM | POA: Diagnosis not present

## 2023-02-27 DIAGNOSIS — Z9989 Dependence on other enabling machines and devices: Secondary | ICD-10-CM | POA: Diagnosis not present

## 2023-02-27 DIAGNOSIS — H579 Unspecified disorder of eye and adnexa: Secondary | ICD-10-CM | POA: Diagnosis not present

## 2023-02-27 DIAGNOSIS — H2513 Age-related nuclear cataract, bilateral: Secondary | ICD-10-CM | POA: Diagnosis not present

## 2023-02-27 DIAGNOSIS — E1136 Type 2 diabetes mellitus with diabetic cataract: Secondary | ICD-10-CM | POA: Diagnosis not present

## 2023-03-13 ENCOUNTER — Encounter: Payer: Self-pay | Admitting: Internal Medicine

## 2023-03-13 DIAGNOSIS — E1122 Type 2 diabetes mellitus with diabetic chronic kidney disease: Secondary | ICD-10-CM | POA: Diagnosis not present

## 2023-03-13 DIAGNOSIS — R278 Other lack of coordination: Secondary | ICD-10-CM | POA: Diagnosis not present

## 2023-03-13 DIAGNOSIS — E038 Other specified hypothyroidism: Secondary | ICD-10-CM | POA: Diagnosis not present

## 2023-03-13 DIAGNOSIS — R001 Bradycardia, unspecified: Secondary | ICD-10-CM | POA: Diagnosis not present

## 2023-03-13 DIAGNOSIS — N182 Chronic kidney disease, stage 2 (mild): Secondary | ICD-10-CM | POA: Diagnosis not present

## 2023-03-13 DIAGNOSIS — R2689 Other abnormalities of gait and mobility: Secondary | ICD-10-CM | POA: Diagnosis not present

## 2023-03-13 DIAGNOSIS — M6281 Muscle weakness (generalized): Secondary | ICD-10-CM | POA: Diagnosis not present

## 2023-03-14 DIAGNOSIS — M6281 Muscle weakness (generalized): Secondary | ICD-10-CM | POA: Diagnosis not present

## 2023-03-14 DIAGNOSIS — R278 Other lack of coordination: Secondary | ICD-10-CM | POA: Diagnosis not present

## 2023-03-14 DIAGNOSIS — R2689 Other abnormalities of gait and mobility: Secondary | ICD-10-CM | POA: Diagnosis not present

## 2023-03-15 DIAGNOSIS — E038 Other specified hypothyroidism: Secondary | ICD-10-CM | POA: Diagnosis not present

## 2023-03-15 DIAGNOSIS — M6281 Muscle weakness (generalized): Secondary | ICD-10-CM | POA: Diagnosis not present

## 2023-03-15 DIAGNOSIS — D638 Anemia in other chronic diseases classified elsewhere: Secondary | ICD-10-CM | POA: Diagnosis not present

## 2023-03-15 DIAGNOSIS — R001 Bradycardia, unspecified: Secondary | ICD-10-CM | POA: Diagnosis not present

## 2023-03-15 DIAGNOSIS — N182 Chronic kidney disease, stage 2 (mild): Secondary | ICD-10-CM | POA: Diagnosis not present

## 2023-03-15 DIAGNOSIS — R2689 Other abnormalities of gait and mobility: Secondary | ICD-10-CM | POA: Diagnosis not present

## 2023-03-15 DIAGNOSIS — R278 Other lack of coordination: Secondary | ICD-10-CM | POA: Diagnosis not present

## 2023-03-15 DIAGNOSIS — K219 Gastro-esophageal reflux disease without esophagitis: Secondary | ICD-10-CM | POA: Diagnosis not present

## 2023-03-15 DIAGNOSIS — E1122 Type 2 diabetes mellitus with diabetic chronic kidney disease: Secondary | ICD-10-CM | POA: Diagnosis not present

## 2023-03-16 DIAGNOSIS — M6281 Muscle weakness (generalized): Secondary | ICD-10-CM | POA: Diagnosis not present

## 2023-03-16 DIAGNOSIS — R2689 Other abnormalities of gait and mobility: Secondary | ICD-10-CM | POA: Diagnosis not present

## 2023-03-16 DIAGNOSIS — R278 Other lack of coordination: Secondary | ICD-10-CM | POA: Diagnosis not present

## 2023-03-17 DIAGNOSIS — R2689 Other abnormalities of gait and mobility: Secondary | ICD-10-CM | POA: Diagnosis not present

## 2023-03-17 DIAGNOSIS — M6281 Muscle weakness (generalized): Secondary | ICD-10-CM | POA: Diagnosis not present

## 2023-03-17 DIAGNOSIS — R278 Other lack of coordination: Secondary | ICD-10-CM | POA: Diagnosis not present

## 2023-03-20 DIAGNOSIS — R2689 Other abnormalities of gait and mobility: Secondary | ICD-10-CM | POA: Diagnosis not present

## 2023-03-20 DIAGNOSIS — R278 Other lack of coordination: Secondary | ICD-10-CM | POA: Diagnosis not present

## 2023-03-20 DIAGNOSIS — M6281 Muscle weakness (generalized): Secondary | ICD-10-CM | POA: Diagnosis not present

## 2023-03-21 DIAGNOSIS — R2689 Other abnormalities of gait and mobility: Secondary | ICD-10-CM | POA: Diagnosis not present

## 2023-03-21 DIAGNOSIS — M6281 Muscle weakness (generalized): Secondary | ICD-10-CM | POA: Diagnosis not present

## 2023-03-21 DIAGNOSIS — R278 Other lack of coordination: Secondary | ICD-10-CM | POA: Diagnosis not present

## 2023-03-22 DIAGNOSIS — D508 Other iron deficiency anemias: Secondary | ICD-10-CM | POA: Diagnosis not present

## 2023-03-22 DIAGNOSIS — R2689 Other abnormalities of gait and mobility: Secondary | ICD-10-CM | POA: Diagnosis not present

## 2023-03-22 DIAGNOSIS — E1122 Type 2 diabetes mellitus with diabetic chronic kidney disease: Secondary | ICD-10-CM | POA: Diagnosis not present

## 2023-03-22 DIAGNOSIS — R278 Other lack of coordination: Secondary | ICD-10-CM | POA: Diagnosis not present

## 2023-03-22 DIAGNOSIS — M6281 Muscle weakness (generalized): Secondary | ICD-10-CM | POA: Diagnosis not present

## 2023-03-23 DIAGNOSIS — E1151 Type 2 diabetes mellitus with diabetic peripheral angiopathy without gangrene: Secondary | ICD-10-CM | POA: Diagnosis not present

## 2023-03-23 DIAGNOSIS — Z7984 Long term (current) use of oral hypoglycemic drugs: Secondary | ICD-10-CM | POA: Diagnosis not present

## 2023-03-23 DIAGNOSIS — L603 Nail dystrophy: Secondary | ICD-10-CM | POA: Diagnosis not present

## 2023-03-23 DIAGNOSIS — L84 Corns and callosities: Secondary | ICD-10-CM | POA: Diagnosis not present

## 2023-03-23 DIAGNOSIS — L602 Onychogryphosis: Secondary | ICD-10-CM | POA: Diagnosis not present

## 2023-03-26 DIAGNOSIS — M6281 Muscle weakness (generalized): Secondary | ICD-10-CM | POA: Diagnosis not present

## 2023-03-26 DIAGNOSIS — R2689 Other abnormalities of gait and mobility: Secondary | ICD-10-CM | POA: Diagnosis not present

## 2023-03-26 DIAGNOSIS — R278 Other lack of coordination: Secondary | ICD-10-CM | POA: Diagnosis not present

## 2023-03-28 DIAGNOSIS — M6281 Muscle weakness (generalized): Secondary | ICD-10-CM | POA: Diagnosis not present

## 2023-03-28 DIAGNOSIS — R278 Other lack of coordination: Secondary | ICD-10-CM | POA: Diagnosis not present

## 2023-03-28 DIAGNOSIS — R2689 Other abnormalities of gait and mobility: Secondary | ICD-10-CM | POA: Diagnosis not present

## 2023-03-29 DIAGNOSIS — R278 Other lack of coordination: Secondary | ICD-10-CM | POA: Diagnosis not present

## 2023-03-29 DIAGNOSIS — R2689 Other abnormalities of gait and mobility: Secondary | ICD-10-CM | POA: Diagnosis not present

## 2023-03-29 DIAGNOSIS — M6281 Muscle weakness (generalized): Secondary | ICD-10-CM | POA: Diagnosis not present

## 2023-03-30 DIAGNOSIS — H2513 Age-related nuclear cataract, bilateral: Secondary | ICD-10-CM | POA: Diagnosis not present

## 2023-04-02 DIAGNOSIS — R2689 Other abnormalities of gait and mobility: Secondary | ICD-10-CM | POA: Diagnosis not present

## 2023-04-02 DIAGNOSIS — R278 Other lack of coordination: Secondary | ICD-10-CM | POA: Diagnosis not present

## 2023-04-02 DIAGNOSIS — M6281 Muscle weakness (generalized): Secondary | ICD-10-CM | POA: Diagnosis not present

## 2023-04-03 DIAGNOSIS — R2689 Other abnormalities of gait and mobility: Secondary | ICD-10-CM | POA: Diagnosis not present

## 2023-04-03 DIAGNOSIS — M6281 Muscle weakness (generalized): Secondary | ICD-10-CM | POA: Diagnosis not present

## 2023-04-03 DIAGNOSIS — R278 Other lack of coordination: Secondary | ICD-10-CM | POA: Diagnosis not present

## 2023-04-04 DIAGNOSIS — M6281 Muscle weakness (generalized): Secondary | ICD-10-CM | POA: Diagnosis not present

## 2023-04-04 DIAGNOSIS — R278 Other lack of coordination: Secondary | ICD-10-CM | POA: Diagnosis not present

## 2023-04-04 DIAGNOSIS — R2689 Other abnormalities of gait and mobility: Secondary | ICD-10-CM | POA: Diagnosis not present

## 2023-04-05 DIAGNOSIS — R2689 Other abnormalities of gait and mobility: Secondary | ICD-10-CM | POA: Diagnosis not present

## 2023-04-05 DIAGNOSIS — M6281 Muscle weakness (generalized): Secondary | ICD-10-CM | POA: Diagnosis not present

## 2023-04-05 DIAGNOSIS — R278 Other lack of coordination: Secondary | ICD-10-CM | POA: Diagnosis not present

## 2023-04-10 DIAGNOSIS — R278 Other lack of coordination: Secondary | ICD-10-CM | POA: Diagnosis not present

## 2023-04-10 DIAGNOSIS — M6281 Muscle weakness (generalized): Secondary | ICD-10-CM | POA: Diagnosis not present

## 2023-04-10 DIAGNOSIS — R2689 Other abnormalities of gait and mobility: Secondary | ICD-10-CM | POA: Diagnosis not present

## 2023-04-11 DIAGNOSIS — M6281 Muscle weakness (generalized): Secondary | ICD-10-CM | POA: Diagnosis not present

## 2023-04-11 DIAGNOSIS — R278 Other lack of coordination: Secondary | ICD-10-CM | POA: Diagnosis not present

## 2023-04-11 DIAGNOSIS — R2689 Other abnormalities of gait and mobility: Secondary | ICD-10-CM | POA: Diagnosis not present

## 2023-04-12 DIAGNOSIS — E1122 Type 2 diabetes mellitus with diabetic chronic kidney disease: Secondary | ICD-10-CM | POA: Diagnosis not present

## 2023-04-12 DIAGNOSIS — R2689 Other abnormalities of gait and mobility: Secondary | ICD-10-CM | POA: Diagnosis not present

## 2023-04-12 DIAGNOSIS — N182 Chronic kidney disease, stage 2 (mild): Secondary | ICD-10-CM | POA: Diagnosis not present

## 2023-04-12 DIAGNOSIS — K219 Gastro-esophageal reflux disease without esophagitis: Secondary | ICD-10-CM | POA: Diagnosis not present

## 2023-04-12 DIAGNOSIS — E038 Other specified hypothyroidism: Secondary | ICD-10-CM | POA: Diagnosis not present

## 2023-04-12 DIAGNOSIS — R278 Other lack of coordination: Secondary | ICD-10-CM | POA: Diagnosis not present

## 2023-04-12 DIAGNOSIS — M6281 Muscle weakness (generalized): Secondary | ICD-10-CM | POA: Diagnosis not present

## 2023-04-12 DIAGNOSIS — D638 Anemia in other chronic diseases classified elsewhere: Secondary | ICD-10-CM | POA: Diagnosis not present

## 2023-04-13 DIAGNOSIS — M6281 Muscle weakness (generalized): Secondary | ICD-10-CM | POA: Diagnosis not present

## 2023-04-13 DIAGNOSIS — R2689 Other abnormalities of gait and mobility: Secondary | ICD-10-CM | POA: Diagnosis not present

## 2023-04-13 DIAGNOSIS — G9340 Encephalopathy, unspecified: Secondary | ICD-10-CM | POA: Diagnosis not present

## 2023-04-13 DIAGNOSIS — R278 Other lack of coordination: Secondary | ICD-10-CM | POA: Diagnosis not present

## 2023-04-16 DIAGNOSIS — M6281 Muscle weakness (generalized): Secondary | ICD-10-CM | POA: Diagnosis not present

## 2023-04-16 DIAGNOSIS — R2689 Other abnormalities of gait and mobility: Secondary | ICD-10-CM | POA: Diagnosis not present

## 2023-04-16 DIAGNOSIS — R278 Other lack of coordination: Secondary | ICD-10-CM | POA: Diagnosis not present

## 2023-04-17 DIAGNOSIS — R2689 Other abnormalities of gait and mobility: Secondary | ICD-10-CM | POA: Diagnosis not present

## 2023-04-17 DIAGNOSIS — R278 Other lack of coordination: Secondary | ICD-10-CM | POA: Diagnosis not present

## 2023-04-17 DIAGNOSIS — M6281 Muscle weakness (generalized): Secondary | ICD-10-CM | POA: Diagnosis not present

## 2023-04-18 DIAGNOSIS — M6281 Muscle weakness (generalized): Secondary | ICD-10-CM | POA: Diagnosis not present

## 2023-04-18 DIAGNOSIS — R2689 Other abnormalities of gait and mobility: Secondary | ICD-10-CM | POA: Diagnosis not present

## 2023-04-18 DIAGNOSIS — R278 Other lack of coordination: Secondary | ICD-10-CM | POA: Diagnosis not present

## 2023-04-19 DIAGNOSIS — R2689 Other abnormalities of gait and mobility: Secondary | ICD-10-CM | POA: Diagnosis not present

## 2023-04-19 DIAGNOSIS — R278 Other lack of coordination: Secondary | ICD-10-CM | POA: Diagnosis not present

## 2023-04-19 DIAGNOSIS — M6281 Muscle weakness (generalized): Secondary | ICD-10-CM | POA: Diagnosis not present

## 2023-04-20 DIAGNOSIS — R278 Other lack of coordination: Secondary | ICD-10-CM | POA: Diagnosis not present

## 2023-04-20 DIAGNOSIS — R2689 Other abnormalities of gait and mobility: Secondary | ICD-10-CM | POA: Diagnosis not present

## 2023-04-20 DIAGNOSIS — M6281 Muscle weakness (generalized): Secondary | ICD-10-CM | POA: Diagnosis not present

## 2023-04-23 DIAGNOSIS — R278 Other lack of coordination: Secondary | ICD-10-CM | POA: Diagnosis not present

## 2023-04-23 DIAGNOSIS — M6281 Muscle weakness (generalized): Secondary | ICD-10-CM | POA: Diagnosis not present

## 2023-04-23 DIAGNOSIS — D508 Other iron deficiency anemias: Secondary | ICD-10-CM | POA: Diagnosis not present

## 2023-04-23 DIAGNOSIS — R2689 Other abnormalities of gait and mobility: Secondary | ICD-10-CM | POA: Diagnosis not present

## 2023-04-23 DIAGNOSIS — E1122 Type 2 diabetes mellitus with diabetic chronic kidney disease: Secondary | ICD-10-CM | POA: Diagnosis not present

## 2023-04-24 DIAGNOSIS — R278 Other lack of coordination: Secondary | ICD-10-CM | POA: Diagnosis not present

## 2023-04-24 DIAGNOSIS — R2689 Other abnormalities of gait and mobility: Secondary | ICD-10-CM | POA: Diagnosis not present

## 2023-04-24 DIAGNOSIS — M6281 Muscle weakness (generalized): Secondary | ICD-10-CM | POA: Diagnosis not present

## 2023-04-27 DIAGNOSIS — R2689 Other abnormalities of gait and mobility: Secondary | ICD-10-CM | POA: Diagnosis not present

## 2023-04-27 DIAGNOSIS — M6281 Muscle weakness (generalized): Secondary | ICD-10-CM | POA: Diagnosis not present

## 2023-04-27 DIAGNOSIS — R278 Other lack of coordination: Secondary | ICD-10-CM | POA: Diagnosis not present

## 2023-04-28 DIAGNOSIS — R278 Other lack of coordination: Secondary | ICD-10-CM | POA: Diagnosis not present

## 2023-04-28 DIAGNOSIS — R2689 Other abnormalities of gait and mobility: Secondary | ICD-10-CM | POA: Diagnosis not present

## 2023-04-28 DIAGNOSIS — M6281 Muscle weakness (generalized): Secondary | ICD-10-CM | POA: Diagnosis not present

## 2023-04-29 DIAGNOSIS — R2689 Other abnormalities of gait and mobility: Secondary | ICD-10-CM | POA: Diagnosis not present

## 2023-04-29 DIAGNOSIS — M6281 Muscle weakness (generalized): Secondary | ICD-10-CM | POA: Diagnosis not present

## 2023-04-29 DIAGNOSIS — R278 Other lack of coordination: Secondary | ICD-10-CM | POA: Diagnosis not present

## 2023-05-01 DIAGNOSIS — R278 Other lack of coordination: Secondary | ICD-10-CM | POA: Diagnosis not present

## 2023-05-01 DIAGNOSIS — R2689 Other abnormalities of gait and mobility: Secondary | ICD-10-CM | POA: Diagnosis not present

## 2023-05-01 DIAGNOSIS — M6281 Muscle weakness (generalized): Secondary | ICD-10-CM | POA: Diagnosis not present

## 2023-05-03 DIAGNOSIS — M6281 Muscle weakness (generalized): Secondary | ICD-10-CM | POA: Diagnosis not present

## 2023-05-03 DIAGNOSIS — R2689 Other abnormalities of gait and mobility: Secondary | ICD-10-CM | POA: Diagnosis not present

## 2023-05-03 DIAGNOSIS — R278 Other lack of coordination: Secondary | ICD-10-CM | POA: Diagnosis not present

## 2023-05-04 DIAGNOSIS — R2689 Other abnormalities of gait and mobility: Secondary | ICD-10-CM | POA: Diagnosis not present

## 2023-05-04 DIAGNOSIS — M6281 Muscle weakness (generalized): Secondary | ICD-10-CM | POA: Diagnosis not present

## 2023-05-04 DIAGNOSIS — R278 Other lack of coordination: Secondary | ICD-10-CM | POA: Diagnosis not present

## 2023-05-08 DIAGNOSIS — R2689 Other abnormalities of gait and mobility: Secondary | ICD-10-CM | POA: Diagnosis not present

## 2023-05-08 DIAGNOSIS — R278 Other lack of coordination: Secondary | ICD-10-CM | POA: Diagnosis not present

## 2023-05-08 DIAGNOSIS — M6281 Muscle weakness (generalized): Secondary | ICD-10-CM | POA: Diagnosis not present

## 2023-05-27 ENCOUNTER — Inpatient Hospital Stay (HOSPITAL_COMMUNITY): Payer: Medicare Other

## 2023-05-27 ENCOUNTER — Encounter (HOSPITAL_COMMUNITY): Payer: Self-pay

## 2023-05-27 ENCOUNTER — Inpatient Hospital Stay (HOSPITAL_COMMUNITY)
Admission: EM | Admit: 2023-05-27 | Discharge: 2023-05-31 | DRG: 177 | Disposition: A | Discharge status: Skilled Nursing Facility | Payer: Medicare Other | Source: Skilled Nursing Facility | Attending: Internal Medicine | Admitting: Internal Medicine

## 2023-05-27 ENCOUNTER — Emergency Department (HOSPITAL_COMMUNITY): Payer: Medicare Other

## 2023-05-27 ENCOUNTER — Other Ambulatory Visit: Payer: Self-pay

## 2023-05-27 DIAGNOSIS — H409 Unspecified glaucoma: Secondary | ICD-10-CM | POA: Diagnosis present

## 2023-05-27 DIAGNOSIS — R109 Unspecified abdominal pain: Secondary | ICD-10-CM | POA: Diagnosis present

## 2023-05-27 DIAGNOSIS — Z6831 Body mass index (BMI) 31.0-31.9, adult: Secondary | ICD-10-CM | POA: Diagnosis not present

## 2023-05-27 DIAGNOSIS — E669 Obesity, unspecified: Secondary | ICD-10-CM | POA: Diagnosis present

## 2023-05-27 DIAGNOSIS — Z6832 Body mass index (BMI) 32.0-32.9, adult: Secondary | ICD-10-CM

## 2023-05-27 DIAGNOSIS — N179 Acute kidney failure, unspecified: Secondary | ICD-10-CM | POA: Diagnosis present

## 2023-05-27 DIAGNOSIS — K2100 Gastro-esophageal reflux disease with esophagitis, without bleeding: Secondary | ICD-10-CM | POA: Diagnosis present

## 2023-05-27 DIAGNOSIS — D638 Anemia in other chronic diseases classified elsewhere: Secondary | ICD-10-CM | POA: Diagnosis present

## 2023-05-27 DIAGNOSIS — M25562 Pain in left knee: Secondary | ICD-10-CM | POA: Diagnosis present

## 2023-05-27 DIAGNOSIS — U071 COVID-19: Principal | ICD-10-CM | POA: Diagnosis present

## 2023-05-27 DIAGNOSIS — D649 Anemia, unspecified: Secondary | ICD-10-CM | POA: Diagnosis not present

## 2023-05-27 DIAGNOSIS — I10 Essential (primary) hypertension: Secondary | ICD-10-CM | POA: Diagnosis present

## 2023-05-27 DIAGNOSIS — E66811 Obesity, class 1: Secondary | ICD-10-CM

## 2023-05-27 DIAGNOSIS — J9601 Acute respiratory failure with hypoxia: Secondary | ICD-10-CM | POA: Diagnosis present

## 2023-05-27 DIAGNOSIS — G4730 Sleep apnea, unspecified: Secondary | ICD-10-CM | POA: Diagnosis present

## 2023-05-27 DIAGNOSIS — I9589 Other hypotension: Secondary | ICD-10-CM | POA: Diagnosis present

## 2023-05-27 DIAGNOSIS — Z931 Gastrostomy status: Secondary | ICD-10-CM | POA: Diagnosis not present

## 2023-05-27 DIAGNOSIS — J189 Pneumonia, unspecified organism: Secondary | ICD-10-CM | POA: Diagnosis not present

## 2023-05-27 DIAGNOSIS — J1282 Pneumonia due to coronavirus disease 2019: Secondary | ICD-10-CM | POA: Diagnosis present

## 2023-05-27 DIAGNOSIS — Z66 Do not resuscitate: Secondary | ICD-10-CM | POA: Diagnosis present

## 2023-05-27 DIAGNOSIS — R1030 Lower abdominal pain, unspecified: Secondary | ICD-10-CM

## 2023-05-27 DIAGNOSIS — Z79899 Other long term (current) drug therapy: Secondary | ICD-10-CM

## 2023-05-27 DIAGNOSIS — E785 Hyperlipidemia, unspecified: Secondary | ICD-10-CM | POA: Diagnosis present

## 2023-05-27 DIAGNOSIS — E6609 Other obesity due to excess calories: Secondary | ICD-10-CM

## 2023-05-27 DIAGNOSIS — E8809 Other disorders of plasma-protein metabolism, not elsewhere classified: Secondary | ICD-10-CM | POA: Diagnosis present

## 2023-05-27 DIAGNOSIS — R1310 Dysphagia, unspecified: Secondary | ICD-10-CM

## 2023-05-27 DIAGNOSIS — E039 Hypothyroidism, unspecified: Secondary | ICD-10-CM | POA: Diagnosis present

## 2023-05-27 DIAGNOSIS — R1032 Left lower quadrant pain: Secondary | ICD-10-CM | POA: Diagnosis present

## 2023-05-27 DIAGNOSIS — Z8719 Personal history of other diseases of the digestive system: Secondary | ICD-10-CM

## 2023-05-27 DIAGNOSIS — F79 Unspecified intellectual disabilities: Secondary | ICD-10-CM

## 2023-05-27 DIAGNOSIS — R0602 Shortness of breath: Secondary | ICD-10-CM | POA: Diagnosis present

## 2023-05-27 DIAGNOSIS — Z79890 Hormone replacement therapy (postmenopausal): Secondary | ICD-10-CM

## 2023-05-27 DIAGNOSIS — N189 Chronic kidney disease, unspecified: Secondary | ICD-10-CM | POA: Insufficient documentation

## 2023-05-27 LAB — COMPREHENSIVE METABOLIC PANEL
ALT: 60 U/L — ABNORMAL HIGH (ref 0–44)
AST: 39 U/L (ref 15–41)
Albumin: 2.1 g/dL — ABNORMAL LOW (ref 3.5–5.0)
Alkaline Phosphatase: 148 U/L — ABNORMAL HIGH (ref 38–126)
Anion gap: 9 (ref 5–15)
BUN: 38 mg/dL — ABNORMAL HIGH (ref 8–23)
CO2: 24 mmol/L (ref 22–32)
Calcium: 8.8 mg/dL — ABNORMAL LOW (ref 8.9–10.3)
Chloride: 105 mmol/L (ref 98–111)
Creatinine, Ser: 1.52 mg/dL — ABNORMAL HIGH (ref 0.61–1.24)
GFR, Estimated: 48 mL/min — ABNORMAL LOW (ref >60)
Glucose, Bld: 113 mg/dL — ABNORMAL HIGH (ref 70–99)
Potassium: 3.9 mmol/L (ref 3.5–5.1)
Sodium: 138 mmol/L (ref 135–145)
Total Bilirubin: 0.7 mg/dL (ref 0.0–1.2)
Total Protein: 6.7 g/dL (ref 6.5–8.1)

## 2023-05-27 LAB — CBC WITH DIFFERENTIAL/PLATELET
Abs Immature Granulocytes: 0.03 10*3/uL (ref 0.00–0.07)
Basophils Absolute: 0 10*3/uL (ref 0.0–0.1)
Basophils Relative: 0 %
Eosinophils Absolute: 0 10*3/uL (ref 0.0–0.5)
Eosinophils Relative: 0 %
HCT: 26 % — ABNORMAL LOW (ref 39.0–52.0)
Hemoglobin: 8.5 g/dL — ABNORMAL LOW (ref 13.0–17.0)
Immature Granulocytes: 0 %
Lymphocytes Relative: 10 %
Lymphs Abs: 0.7 10*3/uL (ref 0.7–4.0)
MCH: 31.8 pg (ref 26.0–34.0)
MCHC: 32.7 g/dL (ref 30.0–36.0)
MCV: 97.4 fL (ref 80.0–100.0)
Monocytes Absolute: 0.4 10*3/uL (ref 0.1–1.0)
Monocytes Relative: 6 %
Neutro Abs: 6 10*3/uL (ref 1.7–7.7)
Neutrophils Relative %: 84 %
Platelets: 172 10*3/uL (ref 150–400)
RBC: 2.67 MIL/uL — ABNORMAL LOW (ref 4.22–5.81)
RDW: 17.1 % — ABNORMAL HIGH (ref 11.5–15.5)
WBC: 7.2 10*3/uL (ref 4.0–10.5)
nRBC: 0 % (ref 0.0–0.2)

## 2023-05-27 LAB — URINALYSIS, W/ REFLEX TO CULTURE (INFECTION SUSPECTED)
Bilirubin Urine: NEGATIVE
Glucose, UA: NEGATIVE mg/dL
Hgb urine dipstick: NEGATIVE
Ketones, ur: NEGATIVE mg/dL
Leukocytes,Ua: NEGATIVE
Nitrite: NEGATIVE
Protein, ur: 100 mg/dL — AB
Specific Gravity, Urine: 1.019 (ref 1.005–1.030)
pH: 5 (ref 5.0–8.0)

## 2023-05-27 LAB — PROTIME-INR
INR: 1.1 (ref 0.8–1.2)
Prothrombin Time: 13.9 s (ref 11.4–15.2)

## 2023-05-27 LAB — RESPIRATORY PANEL BY PCR

## 2023-05-27 LAB — TYPE AND SCREEN
ABO/RH(D): AB NEG
Antibody Screen: NEGATIVE

## 2023-05-27 LAB — SARS CORONAVIRUS 2 BY RT PCR
SARS Coronavirus 2 by RT PCR: NEGATIVE
SARS Coronavirus 2 by RT PCR: NEGATIVE

## 2023-05-27 LAB — OCCULT BLOOD X 1 CARD TO LAB, STOOL: Fecal Occult Bld: NEGATIVE

## 2023-05-27 LAB — MRSA NEXT GEN BY PCR, NASAL: MRSA by PCR Next Gen: NOT DETECTED

## 2023-05-27 LAB — PROCALCITONIN: Procalcitonin: 5.6 ng/mL

## 2023-05-27 LAB — HEMOGLOBIN AND HEMATOCRIT, BLOOD
HCT: 25.4 % — ABNORMAL LOW (ref 39.0–52.0)
Hemoglobin: 8.4 g/dL — ABNORMAL LOW (ref 13.0–17.0)

## 2023-05-27 LAB — TSH: TSH: 1.696 u[IU]/mL (ref 0.350–4.500)

## 2023-05-27 LAB — BRAIN NATRIURETIC PEPTIDE: B Natriuretic Peptide: 307.3 pg/mL — ABNORMAL HIGH (ref 0.0–100.0)

## 2023-05-27 LAB — I-STAT CG4 LACTIC ACID, ED
Lactic Acid, Venous: 1.4 mmol/L (ref 0.5–1.9)
Lactic Acid, Venous: 1.1 mmol/L (ref 0.5–1.9)

## 2023-05-27 LAB — PREALBUMIN: Prealbumin: 14 mg/dL — ABNORMAL LOW (ref 18–38)

## 2023-05-27 MED ORDER — SENNOSIDES-DOCUSATE SODIUM 8.6-50 MG PO TABS
1.0000 | ORAL_TABLET | Freq: Two times a day (BID) | ORAL | Status: DC
  Administered 2023-05-27 – 2023-05-28 (×4): 1 via ORAL
  Filled 2023-05-27 (×4): qty 1

## 2023-05-27 MED ORDER — ONDANSETRON HCL 4 MG/2ML IJ SOLN
4.0000 mg | Freq: Four times a day (QID) | INTRAMUSCULAR | Status: DC | PRN
  Administered 2023-05-28: 4 mg via INTRAVENOUS
  Filled 2023-05-27: qty 2

## 2023-05-27 MED ORDER — DEXAMETHASONE SODIUM PHOSPHATE 10 MG/ML IJ SOLN
6.0000 mg | INTRAMUSCULAR | Status: DC
  Administered 2023-05-27 – 2023-05-28 (×2): 6 mg via INTRAVENOUS
  Filled 2023-05-27: qty 1
  Filled 2023-05-27: qty 0.6

## 2023-05-27 MED ORDER — MIDODRINE HCL 5 MG PO TABS
5.0000 mg | ORAL_TABLET | Freq: Three times a day (TID) | ORAL | Status: DC
  Administered 2023-05-27 – 2023-05-31 (×10): 5 mg
  Filled 2023-05-27 (×10): qty 1

## 2023-05-27 MED ORDER — PANTOPRAZOLE SODIUM 40 MG IV SOLR
40.0000 mg | Freq: Two times a day (BID) | INTRAVENOUS | Status: DC
  Administered 2023-05-27 – 2023-05-31 (×9): 40 mg via INTRAVENOUS
  Filled 2023-05-27 (×9): qty 10

## 2023-05-27 MED ORDER — SODIUM CHLORIDE 0.9% FLUSH
3.0000 mL | Freq: Two times a day (BID) | INTRAVENOUS | Status: DC
  Administered 2023-05-28 – 2023-05-31 (×7): 3 mL via INTRAVENOUS

## 2023-05-27 MED ORDER — CEFTRIAXONE SODIUM 2 G IJ SOLR
2.0000 g | INTRAMUSCULAR | Status: DC
  Administered 2023-05-27 – 2023-05-31 (×5): 2 g via INTRAVENOUS
  Filled 2023-05-27 (×5): qty 20

## 2023-05-27 MED ORDER — FERROUS SULFATE 220 (44 FE) MG/5ML PO SOLN
308.0000 mg | Freq: Every day | ORAL | Status: DC
  Administered 2023-05-27 – 2023-05-28 (×2): 308 mg
  Filled 2023-05-27 (×3): qty 7

## 2023-05-27 MED ORDER — ACETAMINOPHEN 325 MG PO TABS: 650.0000 mg | ORAL_TABLET | Freq: Four times a day (QID) | ORAL | Status: DC | PRN

## 2023-05-27 MED ORDER — ONDANSETRON HCL 4 MG PO TABS: 4.0000 mg | ORAL_TABLET | Freq: Four times a day (QID) | ORAL | Status: DC | PRN

## 2023-05-27 MED ORDER — PANTOPRAZOLE SODIUM 40 MG PO TBEC
40.0000 mg | DELAYED_RELEASE_TABLET | Freq: Two times a day (BID) | ORAL | Status: DC
  Filled 2023-05-27: qty 1

## 2023-05-27 MED ORDER — FREE WATER
225.0000 mL | Freq: Four times a day (QID) | Status: DC
  Administered 2023-05-27 – 2023-05-31 (×16): 225 mL

## 2023-05-27 MED ORDER — HYDROCODONE-ACETAMINOPHEN 5-325 MG PO TABS
1.0000 | ORAL_TABLET | Freq: Four times a day (QID) | ORAL | Status: DC | PRN
  Administered 2023-05-30: 1
  Filled 2023-05-27 (×2): qty 1

## 2023-05-27 MED ORDER — POLYETHYLENE GLYCOL 3350 17 G PO PACK
17.0000 g | PACK | Freq: Every day | ORAL | Status: DC
Start: 2023-05-27 — End: 2023-05-31
  Administered 2023-05-27 – 2023-05-31 (×5): 17 g
  Filled 2023-05-27 (×5): qty 1

## 2023-05-27 MED ORDER — PANCRELIPASE (LIP-PROT-AMYL) 10440-39150 UNITS PO TABS
20880.0000 [IU] | ORAL_TABLET | Freq: Once | ORAL | Status: DC
  Filled 2023-05-27: qty 2

## 2023-05-27 MED ORDER — SODIUM CHLORIDE 0.9 % IV SOLN
500.0000 mg | INTRAVENOUS | Status: DC
  Administered 2023-05-28 – 2023-05-30 (×3): 500 mg via INTRAVENOUS
  Filled 2023-05-27 (×5): qty 5

## 2023-05-27 MED ORDER — JEVITY 1.5 CAL/FIBER PO LIQD
237.0000 mL | Freq: Every day | ORAL | Status: DC
Start: 2023-05-27 — End: 2023-05-31
  Administered 2023-05-27 – 2023-05-31 (×20): 237 mL
  Filled 2023-05-27 (×25): qty 237

## 2023-05-27 MED ORDER — SODIUM CHLORIDE 0.9 % IV SOLN
200.0000 mg | Freq: Once | INTRAVENOUS | Status: AC
  Administered 2023-05-27: 200 mg via INTRAVENOUS
  Filled 2023-05-27: qty 40

## 2023-05-27 MED ORDER — LORATADINE 10 MG PO TABS
10.0000 mg | ORAL_TABLET | Freq: Every day | ORAL | Status: DC
  Administered 2023-05-27 – 2023-05-31 (×5): 10 mg
  Filled 2023-05-27 (×5): qty 1

## 2023-05-27 MED ORDER — ACETAMINOPHEN 650 MG RE SUPP: 650.0000 mg | Freq: Four times a day (QID) | RECTAL | Status: DC | PRN

## 2023-05-27 MED ORDER — ALBUTEROL SULFATE (2.5 MG/3ML) 0.083% IN NEBU
2.5000 mg | INHALATION_SOLUTION | Freq: Four times a day (QID) | RESPIRATORY_TRACT | Status: DC | PRN
  Administered 2023-05-29: 2.5 mg via RESPIRATORY_TRACT
  Filled 2023-05-27: qty 3

## 2023-05-27 MED ORDER — SODIUM CHLORIDE 0.9 % IV SOLN: 100.0000 mg | Freq: Every day | INTRAVENOUS | Status: DC

## 2023-05-27 MED ORDER — ALBUTEROL SULFATE (2.5 MG/3ML) 0.083% IN NEBU
2.5000 mg | INHALATION_SOLUTION | Freq: Four times a day (QID) | RESPIRATORY_TRACT | Status: DC
  Administered 2023-05-27: 2.5 mg via RESPIRATORY_TRACT
  Filled 2023-05-27: qty 3

## 2023-05-27 MED ORDER — SODIUM BICARBONATE 650 MG PO TABS
650.0000 mg | ORAL_TABLET | Freq: Once | ORAL | Status: DC
  Filled 2023-05-27: qty 1

## 2023-05-27 MED ORDER — LEVOTHYROXINE SODIUM 50 MCG PO TABS
50.0000 ug | ORAL_TABLET | Freq: Every day | ORAL | Status: DC
Start: 2023-05-28 — End: 2023-05-31
  Administered 2023-05-28 – 2023-05-31 (×4): 50 ug
  Filled 2023-05-27 (×4): qty 1

## 2023-05-27 MED ORDER — SMOG ENEMA
300.0000 mL | Freq: Once | RECTAL | Status: AC
  Administered 2023-05-27: 300 mL via RECTAL
  Filled 2023-05-27: qty 960

## 2023-05-27 NOTE — ED Triage Notes (Signed)
Pt coming in from [facility]. Pt is COVID positive pt tested positive on 1/16. Pt was found to be at 88% on ra pt placed on 15 L nonrebreather by ems. Pt reports vomiting x 3 days. Pt has a fever of 100.2 for ems. Ems started 22 in left hand.   EMS vitals Bp 96/44 Rr 32 Spo2 100% on 15L  Cbg 140

## 2023-05-27 NOTE — Progress Notes (Addendum)
Patient reportedly had positive COVID-19 testing at his nursing facility.  However COVID testing negative with 2 separate checks here for which he may have had a false positive nursing facility.  The patient received 1 dose of remdesivir which has been since discontinued.  Prolactin was elevated at 5.6 which antibiotics of Rocephin and azithromycin had already been started.  Airborne precautions for discontinued.  The patient's cousin and legal guardian was updated over the phone.

## 2023-05-27 NOTE — Plan of Care (Signed)
  Problem: Education: Goal: Knowledge of risk factors and measures for prevention of condition will improve Outcome: Progressing   Problem: Coping: Goal: Psychosocial and spiritual needs will be supported Outcome: Progressing   Problem: Respiratory: Goal: Will maintain a patent airway Outcome: Progressing Goal: Complications related to the disease process, condition or treatment will be avoided or minimized Outcome: Progressing   

## 2023-05-27 NOTE — Progress Notes (Signed)
Per CT report PEG tube is visualized in stomach, MD aware and ok to use PEG tube, PEG tube patent, no residual noted  Margarita Grizzle

## 2023-05-27 NOTE — ED Notes (Signed)
Pt Guardian would like a phone call at [phone] when pt has a bed upstairs.

## 2023-05-27 NOTE — Progress Notes (Signed)
New Admission Note:   Arrival Method: stretcher Mental Orientation: aa+o to self only Telemetry: box 7 SR Assessment: Completed Skin: C/D/I IV: left hand Pain: denies Tubes: PEG to right abdomen, site C/D/I, unable to confirm placement via auscultation, awaiting CT results Safety Measures: Safety Fall Prevention Plan has been given, discussed and signed Admission: Completed [facility] Orientation: Patient has been orientated to the room, unit and staff.  Family: not present  Orders have been reviewed and implemented. Will continue to monitor the patient. Call light has been placed within reach and bed alarm has been activated.   Margarita Grizzle, RN

## 2023-05-27 NOTE — H&P (Addendum)
History and Physical    Patient: Daniel Bailey. XLK:[id number] DOB: [birth year] DOA: 05/27/2023 DOS: the patient was seen and examined on 05/27/2023 PCP: [name], MD  Patient coming from: [facility]  Chief Complaint:  Chief Complaint  Patient presents with   Shortness of Breath    Covid pos   HPI: Daniel Bailey. is a 73 y.o. male with medical history significant of hypertension, hyperlipidemia, chronic hypotension, dysphagia with recurrent aspiration s/p G tube, colon cancer, diabetes mellitus type 2,  intellectual disability, history of GI bleed thought secondary esophagitis and/or gastritis who presents with progressively worsening shortness of breath and cough.  Records note patient had been had just recently had tested positive for COVID-19 on 1/16. Records noted he had been given a Rocephin 2 g IM on 1/17, and started on Lageviro 4 capsules per day 2 times daily on 1/18. His cousin and legal guardian helped provide additional history as she notes that the history that he may give is unreliable due to his intellectual disability.  At baseline patient does not require oxygen and usually in a wheelchair for mobility, but receives physical therapy.  The patient reports having a cough that is productive, but cannot tell me for how long the cough has been present.  It was reported that he complained of vomiting for the last 3 days, but now states that he had not been vomiting. His cousin makes note that she had not been told that he had been vomiting by the facility. He had incidentally and also complained of left lower quadrant abdominal pain and left knee pain stating that he had possibly fallen but not hit his head.  He is unable to tell me when this occurred.  The patient had requested EMS to be called.  En route with EMS patient was noted to have temperature of 100.2 F, and O2 saturations as low as 88% on room air for which he was placed on nonrebreather on 15L with improvement.   He had been given Tylenol prior to arrival as well.  In the emergency department O2 saturations have been maintained on 3 L of nasal cannula oxygen.  Labs noted hemoglobin 8.5, BUN 38, creatinine 1.52, alkaline phosphatase 148 AST 39, ALT 60, lactic acid 1.4->1.1.  COVID-19 screening was pending.  Chest x-ray noted patchy airspace opacification bilaterally likely representing a viral pneumonia.  Patient was given Decadron 6 mg IV and started on remdesivir.  Review of Systems: As mentioned in the history of present illness. All other systems reviewed and are negative.  Of note due to patient's intellectual disability review of systems may not be accurate. Past Medical History:  Diagnosis Date   Acute encephalopathy 09/02/2021   Anemia    Aspiration pneumonia (HCC)    Colon cancer screening 08/18/2011   Elevated PSA    Frequent urination    Glaucoma    History of COVID-19    Hyperglycemia    Hyperlipidemia    Hypertension    Sleep apnea    Past Surgical History:  Procedure Laterality Date   BIOPSY  04/20/2022   Procedure: BIOPSY;  Surgeon: Beverley Fiedler, MD;  Location: [facility] ENDOSCOPY;  Service: Gastroenterology;;   COLONOSCOPY     ESOPHAGOGASTRODUODENOSCOPY (EGD) WITH PROPOFOL N/A 04/20/2022   Procedure: ESOPHAGOGASTRODUODENOSCOPY (EGD) WITH PROPOFOL;  Surgeon: Beverley Fiedler, MD;  Location: [facility] ENDOSCOPY;  Service: Gastroenterology;  Laterality: N/A;   IR GASTROSTOMY TUBE MOD SED  11/10/2021   PROSTATE BIOPSY N/A  11/20/2017   Procedure: BIOPSY TRANSRECTAL ULTRASONIC PROSTATE (TUBP);  Surgeon: Riki Altes, MD;  Location: [facility] ORS;  Service: Urology;  Laterality: N/A;   Social History:  reports that he has never smoked. He has never been exposed to tobacco smoke. He has never used smokeless tobacco. He reports that he does not drink alcohol and does not use drugs.  No Known Allergies  Family History  Problem Relation Age of Onset   Colon cancer Mother    Kidney disease Mother     Cancer Father        unknown    Prior to Admission medications   Medication Sig Start Date End Date Taking? Authorizing Provider  acetaminophen (TYLENOL) 500 MG tablet Place 2 tablets (1,000 mg total) into feeding tube every 8 (eight) hours as needed for moderate pain. 11/11/21  Yes Burnadette Pop, MD  ferrous sulfate 220 (44 Fe) MG/5ML solution Place 7 mLs into feeding tube daily.   Yes [provider]  LAGEVRIO 200 MG CAPS capsule Take 4 capsules by mouth 2 (two) times daily. 05/26/23  Yes [provider]  levothyroxine (SYNTHROID) 50 MCG tablet Place 50 mcg into feeding tube daily before breakfast.   Yes [provider]  loratadine (CLARITIN) 10 MG tablet Place 10 mg into feeding tube daily.   Yes [provider]  losartan (COZAAR) 25 MG tablet Take 12.5 mg by mouth daily. 04/23/23  Yes [provider]  magnesium hydroxide (MILK OF MAGNESIA) 400 MG/5ML suspension Place 30 mLs into feeding tube 2 (two) times daily as needed for moderate constipation.   Yes [provider]  Nutritional Supplements (FEEDING SUPPLEMENT, JEVITY 1.5 CAL/FIBER,) LIQD Place 237 mLs into feeding tube 5 (five) times daily.   Yes [provider]  midodrine (PROAMATINE) 5 MG tablet Place 1 tablet (5 mg total) into feeding tube 3 (three) times daily with meals. Patient taking differently: Place 5 mg into feeding tube 3 (three) times daily with meals. For chronic hypotension 11/11/21   Burnadette Pop, MD  OMEPRAZOLE PO 20 mg by Gastric Tube route 2 (two) times daily.    [provider]  ondansetron (ZOFRAN-ODT) 4 MG disintegrating tablet 4 mg See admin instructions. Give 4 mg per tube three times daily as needed for nausea and vomiting 04/19/22   [provider]  Water For Irrigation, Sterile (FREE WATER) SOLN Place 225 mLs into feeding tube every 6 (six) hours.    [provider]    Physical Exam: Vitals:   05/27/23 0551 05/27/23 0555  05/27/23 0636 05/27/23 0700  BP:   (!) 106/51 93/62  Pulse: 71 79 76 75  Resp: (!) 31 (!) 30 (!) 33 (!) 26  Temp:      TempSrc:      SpO2: 91% 96% 98% 99%  Weight:      Height:        Constitutional: Elderly male who appears ill but in no acute distress Eyes: PERRL, lids and conjunctivae normal ENMT: Mucous membranes are moist.  Poor dentition with multiple missing teeth patient has a few upper teeth still present. Neck: normal, supple Respiratory: clear to auscultation bilaterally, no wheezing, no crackles. Normal respiratory effort. No accessory muscle use.  Cardiovascular: Regular rate and rhythm, no murmurs / rubs / gallops. No extremity edema. 2+ pedal pulses. No carotid bruits.  Abdomen: Tenderness to palpation of the mid to left lower quadrant of the abdomen. Bowel sounds positive.  Musculoskeletal: no clubbing / cyanosis. No joint  deformity upper and lower extremities. Good ROM, no contractures. Normal muscle tone.  Tenderness to palpation with note of yellow/green color change around the medial aspect of the left knee suggesting possible bruising. Skin: As noted above.  Otherwise no significant rashes or lesions appreciated. Neurologic: CN 2-12 grossly intact.  Able to move all extremities.  Speech intact. Psychiatric: Alert and oriented to person.  Pleasant mood.  Data Reviewed:  EKG reveals sinus rhythm at 72 bpm with PVC, RBBB, and LPFB. Assessment and Plan:  Acute respiratory failure with hypoxia secondary to community-acquired pneumonia Patient presented due to worsening shortness of breath and cough.  He had initially been diagnosed with COVID-19 at the facility on 1/16.  Appears he had been given Rocephin 2 g IM x 1 dose then started on Lageviro (molnupiravir) yesterday.  En route with EMS O2 saturations noted to be as low as 88%, and currently O2 saturation maintained on 3 L of oxygen. Chest x-ray noted patchy airspace opacification bilaterally.  Patient had been started  on Decadron IV and remdesivir.  COVID-19 screening here in the ED however was noted to be negative.  Question the possibility of false negative.  Question COVID-19 pneumonia versus bacterial infection as patient also has history of recurrent aspiration -Admit to a telemetry bed -Airborne precautions -Continuous pulse oximetry with nasal cannula oxygen to maintain O2 saturations greater than 92%. -Aspiration precautions with elevation head of the bed greater than 30 degrees -Incentive spirometry and flutter valve if able to use -Rechecking COVID-19 swab and complete respiratory virus panel -Check procalcitonin(5.6) and BNP -Continue Decadron -Unable to continue molnupiravir in the hospital -Remdesivir per protocol was discontinued. -Mucinex -Start empiric antibiotics of Rocephin and azithromycin  Normocytic anemia History of GI bleed Acute on chronic.  Hemoglobin 8.5 which appears lower than prior of 10.5 back in 01/2023.  Records note prior history of GI bleed related to gastritis based off EGD back in 04/2022. -Type and screen for possible need of blood products. -Check stool guaiac -Serial monitoring of H&H  Chronic hypotension Blood pressures noted to be as low as 93/62. -Continue midodrine -Held losartan  Suspected acute kidney injury Creatinine noted to be 1.52 with BUN 38.  Baseline creatinine previously noted to be 1 1-1.3 in 01/2023. -Avoid possible nephrotoxic agents -Check urinalysis -Utilize external male catheter to collect sample as needed -Check CT scan of the abdomen  Abdominal pain Acute.  Patient did report complaints of abdominal pain and on exam seem to have left lower quadrant tenderness. -Check CT scan of abdomen and pelvis -Hydrocodone per per tube as needed for pain  Dysphagia S/p PEG tube Hypoalbuminemia Patient on chronic tube feeds due to history of dysphagia with recurrent aspiration.  Albumin noted to be low at 2.1 -Check  prealbumin -Continue  feeding  -Registered dietitian consult for adjustment of tube feeds -Continue free water  Hypothyroidism TSH last noted to be 5.716 when checked on 01/07/2023. -Check TSH -Continue levothyroxine  Intellectual disability Legal guardian notes that history is sometimes unreliable from the patient.  GERD with esophagitis Patient with prior history of LA grade D esophagitis as well as patchy gastritis noted on EGD from 04/2022. -Continue omeprazole for pharmacy substitution  Obesity BMI 31.744 kg/m  DVT prophylaxis: SCDs  Advance Care Planning:   Code Status: Limited: Do not attempt resuscitation (DNR) -DNR-LIMITED -Do Not Intubate/DNI confirmed by the patient's legal guardian over the phone  Consults: Patient  Family Communication: Cousin and legal guardian updated over the phone  Severity of Illness: The appropriate patient status for this patient is INPATIENT. Inpatient status is judged to be reasonable and necessary in order to provide the required intensity of service to ensure the patient's safety. The patient's presenting symptoms, physical exam findings, and initial radiographic and laboratory data in the context of their chronic comorbidities is felt to place them at high risk for further clinical deterioration. Furthermore, it is not anticipated that the patient will be medically stable for discharge from the hospital within 2 midnights of admission.   * I certify that at the point of admission it is my clinical judgment that the patient will require inpatient hospital care spanning beyond 2 midnights from the point of admission due to high intensity of service, high risk for further deterioration and high frequency of surveillance required.*  Author: Clydie Braun, MD 05/27/2023 7:23 AM  For on call review [email or web address].

## 2023-05-28 DIAGNOSIS — J9601 Acute respiratory failure with hypoxia: Secondary | ICD-10-CM | POA: Diagnosis not present

## 2023-05-28 LAB — COMPREHENSIVE METABOLIC PANEL
ALT: 44 U/L (ref 0–44)
AST: 23 U/L (ref 15–41)
Albumin: 2 g/dL — ABNORMAL LOW (ref 3.5–5.0)
Alkaline Phosphatase: 146 U/L — ABNORMAL HIGH (ref 38–126)
Anion gap: 9 (ref 5–15)
BUN: 38 mg/dL — ABNORMAL HIGH (ref 8–23)
CO2: 24 mmol/L (ref 22–32)
Calcium: 8.6 mg/dL — ABNORMAL LOW (ref 8.9–10.3)
Chloride: 106 mmol/L (ref 98–111)
Creatinine, Ser: 1.15 mg/dL (ref 0.61–1.24)
GFR, Estimated: >60 mL/min (ref >60)
Glucose, Bld: 269 mg/dL — ABNORMAL HIGH (ref 70–99)
Potassium: 3.9 mmol/L (ref 3.5–5.1)
Sodium: 139 mmol/L (ref 135–145)
Total Bilirubin: 0.3 mg/dL (ref 0.0–1.2)
Total Protein: 6.4 g/dL — ABNORMAL LOW (ref 6.5–8.1)

## 2023-05-28 LAB — HEMOGLOBIN A1C
Hgb A1c MFr Bld: 6.2 % — ABNORMAL HIGH (ref 4.8–5.6)
Mean Plasma Glucose: 131.24 mg/dL

## 2023-05-28 LAB — CBC
HCT: 24.8 % — ABNORMAL LOW (ref 39.0–52.0)
Hemoglobin: 8.2 g/dL — ABNORMAL LOW (ref 13.0–17.0)
MCH: 31.9 pg (ref 26.0–34.0)
MCHC: 33.1 g/dL (ref 30.0–36.0)
MCV: 96.5 fL (ref 80.0–100.0)
Platelets: 182 10*3/uL (ref 150–400)
RBC: 2.57 MIL/uL — ABNORMAL LOW (ref 4.22–5.81)
RDW: 16.7 % — ABNORMAL HIGH (ref 11.5–15.5)
WBC: 8.6 10*3/uL (ref 4.0–10.5)
nRBC: 0 % (ref 0.0–0.2)

## 2023-05-28 LAB — GLUCOSE, CAPILLARY
Glucose-Capillary: 308 mg/dL — ABNORMAL HIGH (ref 70–99)
Glucose-Capillary: 324 mg/dL — ABNORMAL HIGH (ref 70–99)
Glucose-Capillary: 260 mg/dL — ABNORMAL HIGH (ref 70–99)

## 2023-05-28 MED ORDER — ORAL CARE MOUTH RINSE
15.0000 mL | OROMUCOSAL | Status: DC
  Administered 2023-05-28 – 2023-05-31 (×13): 15 mL via OROMUCOSAL

## 2023-05-28 MED ORDER — INSULIN ASPART 100 UNIT/ML IJ SOLN
0.0000 [IU] | Freq: Three times a day (TID) | INTRAMUSCULAR | Status: DC
  Administered 2023-05-28 (×2): 11 [IU] via SUBCUTANEOUS
  Administered 2023-05-29 (×2): 5 [IU] via SUBCUTANEOUS
  Administered 2023-05-29: 8 [IU] via SUBCUTANEOUS
  Administered 2023-05-30 – 2023-05-31 (×3): 3 [IU] via SUBCUTANEOUS

## 2023-05-28 MED ORDER — SENNOSIDES-DOCUSATE SODIUM 8.6-50 MG PO TABS
1.0000 | ORAL_TABLET | Freq: Two times a day (BID) | ORAL | Status: DC
  Administered 2023-05-29 – 2023-05-31 (×4): 1
  Filled 2023-05-28 (×4): qty 1

## 2023-05-28 MED ORDER — INSULIN ASPART 100 UNIT/ML IJ SOLN
0.0000 [IU] | Freq: Every day | INTRAMUSCULAR | Status: DC
  Administered 2023-05-28: 3 [IU] via SUBCUTANEOUS
  Administered 2023-05-29: 2 [IU] via SUBCUTANEOUS

## 2023-05-28 MED ORDER — ORAL CARE MOUTH RINSE: 15.0000 mL | OROMUCOSAL | Status: DC | PRN

## 2023-05-28 MED ORDER — PROSOURCE TF20 ENFIT COMPATIBL EN LIQD
60.0000 mL | Freq: Every day | ENTERAL | Status: DC
  Administered 2023-05-28 – 2023-05-30 (×3): 60 mL
  Filled 2023-05-28 (×3): qty 60

## 2023-05-28 NOTE — Progress Notes (Signed)
Initial Nutrition Assessment  DOCUMENTATION CODES:   Not applicable  INTERVENTION:  Continue tube feeding via PEG: Jevity 1.5 ( ) 5x daily (1185 ml per day) Free Water Flush Q6 hours Add Pro-Source TF20 60 mL QD , each packet provides 20 grams of protein and 80 kcals.    Provides 1,857 kcal, 95 gm protein, 1800 ml free water daily   NUTRITION DIAGNOSIS:  Inadequate oral intake related to inability to eat as evidenced by NPO status.  GOAL:  Patient will meet greater than or equal to 90% of their needs  MONITOR:  Weight trends, TF tolerance, Skin  REASON FOR ASSESSMENT:  Consult Assessment of nutrition requirement/status  ASSESSMENT:   Patient presented to ED from [facility] w/ SOB and cough. COVID + as of 01/16, however noted to perhaps have been a false positive after two negative tests during admission. CXR notable for likely viral PNA.  PMH: HTN, HLD, chronic hypotension, dysphagia w/ recurrent aspiration s/p G tube, colon CA, T2DM, intellectual disability, GI bleed 2/2 esophagitis and/or gastritis.  Patient with PEG tube in place. He is on chronic tube feeds due to hx of dysphagia w/ recurrent aspiration.   Per chart review based on previous admissions, patient's weight has trended up by 3.4% over the last year. UBW range appears to be between 82-92kg. No edema documented. No N/V noted, per chart review. Did require an enema on 01/19, which was effective. Skin integrity intact.  Admit Weight: 89.2kg  Current TF regimen appears to be meeting needs at baseline. Recommend addition of Prosource to supplement calories/protein related to increased labor expenditure with breathing and d/t infection.   Labs: CBGs 113-269mg /dL Y7W 6.2 (29/5621) Crt 3.08-->6.57  Meds: ferous sulfate, SSI, levothyroxine, lipase, sodium bicarbonate, pantoprazole, senna-docusate, Miralax, IV ABX  Diet Order:   Diet Order             Diet NPO time specified  Diet effective now              EDUCATION NEEDS:  Not appropriate for education at this time  Skin:  Skin Assessment: Reviewed RN Assessment  Last BM:  01/19  Height:  Ht Readings from Last 1 Encounters:  05/27/23 5\' 6"  (1.676 m)    Weight:  Wt Readings from Last 1 Encounters:  05/27/23 89.2 kg    Ideal Body Weight:  64.5 kg  BMI:  Body mass index is 31.74 kg/m.  Estimated Nutritional Needs:   Kcal:  1700-1900kcal  Protein:  85-95g  Fluid:  1.7-1.9L/day  Myrtie Cruise MS, RD, LDN Registered Dietitian Clinical Nutrition RD Inpatient Contact Info in Amion

## 2023-05-28 NOTE — Progress Notes (Signed)
PROGRESS NOTE    Daniel Bailey.  XTG:[id number] DOB: March 01, 1951 DOA: 05/27/2023 PCP: [name], MD   Brief Narrative:  This 73 yrs old male with medical history significant of hypertension, hyperlipidemia, chronic hypotension, dysphagia with recurrent aspiration s/p G tube, colon cancer, diabetes mellitus type 2,  intellectual disability, history of GI bleed thought secondary esophagitis and/or gastritis who presents with progressively worsening shortness of breath and cough. At baseline patient does not require oxygen and usually in a wheelchair for mobility, but receives physical therapy.  Patient was found to be febrile on arrival in the ED and was hypoxic requiring 15 L of oxygen on a nonrebreather.  Pertinent labs include hemoglobin 8.5, creatinine 1.52 chest x-ray noted patchy airspace opacification bilaterally likely viral pneumonia. Patient was started on Decadron and IV remdesivir because of positive COVID test. Subsequent two COVID test were negative. Remdesivir and Decadron discontinued.  Assessment & Plan:   Principal Problem:   Acute respiratory failure with hypoxia (HCC) Active Problems:   CAP (community acquired pneumonia)   Normocytic anemia   Chronic hypotension   AKI (acute kidney injury) (HCC)   Abdominal pain   Feeding by G-tube (HCC)   Dysphagia   Hypothyroidism   Intellectual disability   GERD with esophagitis   Obesity  Acute hypoxic respiratory failure secondary to community-acquired pneumonia: Patient presented due to worsening shortness of breath and cough.   He had initially been diagnosed with COVID-19 at the facility on 1/16.   Continue supplemental oxygen and wean as tolerated.  Chest x-ray noted patchy airspace opacification bilaterally.   Patient had been started on Decadron IV and remdesivir.   COVID-19 screening here in the ED however was noted to be negative.  Aspiration precautions with elevation head of the bed greater than 30  degrees Incentive spirometry and flutter valve if able to use Recheck COVID swab negative. Decadron and remdesivir discontinued. Continue Mucinex Continue empiric antibiotic Rocephin and azithromycin.   Normocytic anemia: History of GI bleed Acute on chronic. Hemoglobin 8.5 which appears lower than prior of 10.5 back in 01/2023.   Patient has prior history of GI bleed related to gastritis based off EGD back in 04/2022. Type and screen for possible need of blood products. Check stool guaiac There is no any obvious visible bleeding.  Monitor H&H  Chronic hypotension: Blood pressures noted to be as low as 93/62. Continue midodrine Held losartan   Acute kidney injury: > Resolved. Baseline creatinine 1.1-1.3.  Creatinine on admission 1.52. Renal functions back to normal.   Abdominal pain: Acute.  Patient did report complaints of abdominal pain and on exam seem to have left lower quadrant tenderness. CT A/P: Findings consistent with multifocal pneumonia. Continue Hydrocodone per per tube as needed for pain.   Dysphagia S/p PEG tube Hypoalbuminemia Patient on chronic tube feeds due to history of dysphagia with recurrent aspiration.   Albumin noted to be low at 2.1 Continue feeding  Registered dietitian consult for adjustment of tube feeds Continue free water   Hypothyroidism: Continue levothyroxine.   Intellectual disability: Legal guardian notes that history is sometimes unreliable from the patient.   GERD with esophagitis: Patient with prior history of LA grade D esophagitis as well as patchy gastritis noted on EGD from 04/2022. Continue pantoprazole.  Obesity BMI 31.744 kg/m. Diet explained in detail.  DVT prophylaxis: SCDs Code Status: Full code Family Communication: No family at bed side Disposition Plan:    Status is: Inpatient Remains inpatient appropriate because: Severity  of the disease.   Consultants:  None  Procedures: None  Antimicrobials:   Anti-infectives (From admission, onward)    Start     Dose/Rate Route Frequency Ordered Stop   05/28/23 1000  remdesivir 100 mg in sodium chloride 0.9 % 100 mL IVPB  Status:  Discontinued       Placed in "Followed by" Linked Group   100 mg 200 mL/hr over 30 Minutes Intravenous Daily 05/27/23 0654 05/27/23 0956   05/27/23 1015  cefTRIAXone (ROCEPHIN) 2 g in sodium chloride 0.9 % 100 mL IVPB        2 g 200 mL/hr over 30 Minutes Intravenous Every 24 hours 05/27/23 1000     05/27/23 1015  azithromycin (ZITHROMAX) 500 mg in sodium chloride 0.9 % 250 mL IVPB        500 mg 250 mL/hr over 60 Minutes Intravenous Every 24 hours 05/27/23 1000 06/01/23 1014   05/27/23 0800  remdesivir 200 mg in sodium chloride 0.9% 250 mL IVPB       Placed in "Followed by" Linked Group   200 mg 580 mL/hr over 30 Minutes Intravenous Once 05/27/23 0654 05/27/23 1044        Subjective: Patient was seen and examined at bedside.  Overnight events noted. Patient denies any concerns.    Objective: Vitals:   05/27/23 2057 05/28/23 0137 05/28/23 0603 05/28/23 0740  BP: (!) 103/55 (!) 102/55 (!) 96/54 (!) 93/44  Pulse: 70 (!) 46 (!) 53   Resp: 17 19    Temp: 97.6 F (36.4 C) 97.8 F (36.6 C) 97.6 F (36.4 C) 97.9 F (36.6 C)  TempSrc: Oral Oral Oral Oral  SpO2: 96% 96% 98% 94%  Weight:      Height:        Intake/Output Summary (Last 24 hours) at 05/28/2023 1349 Last data filed at 05/28/2023 0932 Gross per 24 hour  Intake 1725.8 ml  Output 1305 ml  Net 420.8 ml   Filed Weights   05/27/23 0531 05/27/23 1219  Weight: 83.9 kg 89.2 kg    Examination:  General exam: Appears calm and comfortable, deconditioned, not in any acute distress. Respiratory system: CTA Bilaterally. Respiratory effort normal.  RR 15 Cardiovascular system: S1 & S2 heard, RRR. No JVD, murmurs, rubs, gallops or clicks.  Gastrointestinal system: Abdomen is nondistended, soft and nontender. Normal bowel sounds heard. Central nervous  system: Alert and oriented x 2. No focal neurological deficits. Extremities: No edema, no cyanosis, no clubbing Skin: No rashes, lesions or ulcers Psychiatry: Judgement and insight appear normal. Mood & affect appropriate.     Data Reviewed: I have personally reviewed following labs and imaging studies  CBC: Recent Labs  Lab 05/27/23 0545 05/27/23 1321 05/28/23 0106  WBC 7.2  --  8.6  NEUTROABS 6.0  --   --   HGB 8.5* 8.4* 8.2*  HCT 26.0* 25.4* 24.8*  MCV 97.4  --  96.5  PLT 172  --  182   Basic Metabolic Panel: Recent Labs  Lab 05/27/23 0545 05/28/23 0106  NA 138 139  K 3.9 3.9  CL 105 106  CO2 24 24  GLUCOSE 113* 269*  BUN 38* 38*  CREATININE 1.52* 1.15  CALCIUM 8.8* 8.6*   GFR: Estimated Creatinine Clearance: 59.9 mL/min (by C-G formula based on SCr of 1.15 mg/dL). Liver Function Tests: Recent Labs  Lab 05/27/23 0545 05/28/23 0106  AST 39 23  ALT 60* 44  ALKPHOS 148* 146*  BILITOT 0.7 0.3  PROT  6.7 6.4*  ALBUMIN 2.1* 2.0*   No results for input(s): "LIPASE", "AMYLASE" in the last 168 hours. No results for input(s): "AMMONIA" in the last 168 hours. Coagulation Profile: Recent Labs  Lab 05/27/23 0545  INR 1.1   Cardiac Enzymes: No results for input(s): "CKTOTAL", "CKMB", "CKMBINDEX", "TROPONINI" in the last 168 hours. BNP (last 3 results) No results for input(s): "PROBNP" in the last 8760 hours. HbA1C: Recent Labs    05/28/23 0903  HGBA1C 6.2*   CBG: Recent Labs  Lab 05/28/23 1219  GLUCAP 308*   Lipid Profile: No results for input(s): "CHOL", "HDL", "LDLCALC", "TRIG", "CHOLHDL", "LDLDIRECT" in the last 72 hours. Thyroid Function Tests: Recent Labs    05/27/23 0545  TSH 1.696   Anemia Panel: No results for input(s): "VITAMINB12", "FOLATE", "FERRITIN", "TIBC", "IRON", "RETICCTPCT" in the last 72 hours. Sepsis Labs: Recent Labs  Lab 05/27/23 0543 05/27/23 0653 05/27/23 0733  PROCALCITON 5.60  --   --   LATICACIDVEN  --  1.4 1.1     Recent Results (from the past 240 hours)  Culture, blood (Routine x 2)     Status: None (Preliminary result)   Collection Time: 05/27/23  5:40 AM   Specimen: BLOOD RIGHT ARM  Result Value Ref Range Status   Specimen Description BLOOD RIGHT ARM  Final   Special Requests   Final    BOTTLES DRAWN AEROBIC ONLY Blood Culture results may not be optimal due to an inadequate volume of blood received in culture bottles   Culture   Final    NO GROWTH 1 DAY Performed at [facility], [address], [place], [place] [address]    Report Status PENDING  Incomplete  Culture, blood (Routine x 2)     Status: None (Preliminary result)   Collection Time: 05/27/23  5:43 AM   Specimen: BLOOD RIGHT ARM  Result Value Ref Range Status   Specimen Description BLOOD RIGHT ARM  Final   Special Requests   Final    BOTTLES DRAWN AEROBIC AND ANAEROBIC Blood Culture adequate volume   Culture   Final    NO GROWTH 1 DAY Performed at [facility], [address] [place], [place] [address]    Report Status PENDING  Incomplete  SARS Coronavirus 2 by RT PCR (hospital order, performed in [facility] hospital lab) *cepheid single result test* Anterior Nasal Swab     Status: None   Collection Time: 05/27/23  6:34 AM   Specimen: Anterior Nasal Swab  Result Value Ref Range Status   SARS Coronavirus 2 by RT PCR NEGATIVE NEGATIVE Final    Comment: Performed at [facility], [address] [place], [place] [address]  Respiratory (~20 pathogens) panel by PCR     Status: None   Collection Time: 05/27/23  9:56 AM   Specimen: Nasopharyngeal Swab; Respiratory  Result Value Ref Range Status   Adenovirus NOT DETECTED NOT DETECTED Final   Coronavirus 229E NOT DETECTED NOT DETECTED Final    Comment: (NOTE) The Coronavirus on the Respiratory Panel, DOES NOT test for the novel  Coronavirus (2019 nCoV)    Coronavirus HKU1 NOT DETECTED NOT DETECTED Final   Coronavirus NL63 NOT DETECTED NOT DETECTED Final    Coronavirus OC43 NOT DETECTED NOT DETECTED Final   Metapneumovirus NOT DETECTED NOT DETECTED Final   Rhinovirus / Enterovirus NOT DETECTED NOT DETECTED Final   Influenza A NOT DETECTED NOT DETECTED Final   Influenza B NOT DETECTED NOT DETECTED  Final   Parainfluenza Virus 1 NOT DETECTED NOT DETECTED Final   Parainfluenza Virus 2 NOT DETECTED NOT DETECTED Final   Parainfluenza Virus 3 NOT DETECTED NOT DETECTED Final   Parainfluenza Virus 4 NOT DETECTED NOT DETECTED Final   Respiratory Syncytial Virus NOT DETECTED NOT DETECTED Final   Bordetella pertussis NOT DETECTED NOT DETECTED Final   Bordetella Parapertussis NOT DETECTED NOT DETECTED Final   Chlamydophila pneumoniae NOT DETECTED NOT DETECTED Final   Mycoplasma pneumoniae NOT DETECTED NOT DETECTED Final    Comment: Performed at [facility], [address] [place], [place] [address]  SARS Coronavirus 2 by RT PCR (hospital order, performed in [facility] hospital lab) *cepheid single result test* Anterior Nasal Swab     Status: None   Collection Time: 05/27/23 12:41 PM   Specimen: Anterior Nasal Swab  Result Value Ref Range Status   SARS Coronavirus 2 by RT PCR NEGATIVE NEGATIVE Final    Comment: Performed at [facility], [address], [place], [place] [address]  MRSA Next Gen by PCR, Nasal     Status: None   Collection Time: 05/27/23  1:39 PM   Specimen: Nasal Mucosa; Nasal Swab  Result Value Ref Range Status   MRSA by PCR Next Gen NOT DETECTED NOT DETECTED Final    Comment: (NOTE) The GeneXpert MRSA Assay (FDA approved for NASAL specimens only), is one component of a comprehensive MRSA colonization surveillance program. It is not intended to diagnose MRSA infection nor to guide or monitor treatment for MRSA infections. Test performance is not FDA approved in patients less than 17 years old. Performed at [facility], [address] [place], [place] [address]     Radiology Studies: CT ABDOMEN PELVIS WO  CONTRAST Result Date: 05/27/2023 CLINICAL DATA:  Abdominal pain. Acute. Positive COVID test. Fever and vomiting. EXAM: CT ABDOMEN AND PELVIS WITHOUT CONTRAST TECHNIQUE: Multidetector CT imaging of the abdomen and pelvis was performed following the standard protocol without IV contrast. RADIATION DOSE REDUCTION: This exam was performed according to the departmental dose-optimization program which includes automated exposure control, adjustment of the mA and/or kV according to patient size and/or use of iterative reconstruction technique. COMPARISON:  01/09/2023 FINDINGS: Lower chest: Patchy areas of ground-glass attenuation identified within the imaged portions of both lungs. Consolidative changes identified within the posterior left lower lobe. Hepatobiliary: Unchanged low-density lesions within caudate lobe and lateral segment of left lobe of liver. These likely represent cysts. No suspicious liver abnormality identified. Small stones noted layering within the dependent portion of the gallbladder. No signs of gallbladder wall inflammation or bile duct dilatation. Pancreas: Unremarkable. No pancreatic ductal dilatation or surrounding inflammatory changes. Spleen: Normal in size without focal abnormality. Adrenals/Urinary Tract: Normal adrenal glands. No signs of nephrolithiasis, hydronephrosis or mass. No focal bladder abnormality. Stomach/Bowel: Percutaneous gastrostomy tube is identified within the stomach. No pathologic dilatation of the large or small bowel loops. The cecum is identified along the midline of the abdomen. The appendix is visualized and appears normal. No bowel wall thickening, inflammation, or distension. Moderate retained stool identified within the rectum. Vascular/Lymphatic: Mild aortic atherosclerosis. No signs of aneurysm. No abdominopelvic adenopathy. Reproductive: No mass identified. Other: Small amount of fluid is identified overlying the spleen within the left upper quadrant of the  abdomen, nonspecific, image 24/3. No focal fluid collections identified to suggest abscess. No signs of pneumoperitoneum. Fat containing umbilical hernia has a transverse diameter of 7 cm, image 61/3. There is also  a fat containing right inguinal hernia which measures 8.1 cm, image 89/6. There is a trace amount of deep Linden fluid within the hernia sac as well as mild soft tissue stranding within the herniated fat. Musculoskeletal: No acute or significant osseous findings. Scoliosis and degenerative disc disease. IMPRESSION: 1. Patchy areas of ground-glass attenuation identified within the imaged portions of both lungs. Consolidative changes identified within the posterior left lower lobe. Findings are compatible with multifocal pneumonia. Atypical pneumonia not excluded. 2. Small amount of fluid is identified overlying the spleen within the left upper quadrant of the abdomen, nonspecific. No focal fluid collections identified to suggest abscess. 3. Fat containing umbilical hernia has a transverse diameter of 7 cm. There is also a fat containing right inguinal hernia which measures 8.1 cm. There is a trace amount of deep Linden fluid within the hernia sac as well as mild soft tissue stranding within the herniated fat. Correlate for any clinical signs or symptoms of incarceration. 4. Cholelithiasis. 5. Moderate retained stool identified within the rectum. 6.  Aortic Atherosclerosis (ICD10-I70.0). Electronically Signed   By: Signa Kell M.D.   On: 05/27/2023 12:49   DG Chest Portable 1 View Result Date: 05/27/2023 CLINICAL DATA:  Hypoxia.  COVID-19 positive. EXAM: PORTABLE CHEST 1 VIEW COMPARISON:  01/07/2023 FINDINGS: Lungs are adequately inflated demonstrate patchy hazy airspace opacification bilaterally likely representing viral pneumonia in this patient who is COVID-19 positive. No effusion or pneumothorax. Cardiomediastinal silhouette and remainder of the exam is unchanged. IMPRESSION: Patchy hazy airspace  opacification bilaterally likely representing viral pneumonia in this patient who is COVID-19 positive. Electronically Signed   By: Elberta Fortis M.D.   On: 05/27/2023 07:59   Scheduled Meds:  feeding supplement (JEVITY 1.5 CAL/FIBER)  237 mL Per Tube 5 X Daily   feeding supplement (PROSource TF20)  60 mL Per Tube Daily   ferrous sulfate  308 mg Per Tube Daily   free water  225 mL Per Tube Q6H   insulin aspart  0-15 Units Subcutaneous TID WC   insulin aspart  0-5 Units Subcutaneous QHS   levothyroxine  50 mcg Per Tube Q0600   lipase/protease/amylase)  20,880 Units Per Tube Once   And   sodium bicarbonate  650 mg Per Tube Once   loratadine  10 mg Per Tube Daily   midodrine  5 mg Per Tube TID WC   mouth rinse  15 mL Mouth Rinse 4 times per day   pantoprazole (PROTONIX) IV  40 mg Intravenous Q12H   polyethylene glycol  17 g Per Tube Daily   senna-docusate  1 tablet Oral BID   sodium chloride flush  3 mL Intravenous Q12H   Continuous Infusions:  azithromycin 500 mg (05/28/23 0747)   cefTRIAXone (ROCEPHIN)  IV 2 g (05/28/23 0932)     LOS: 1 day    Time spent: 50 mins    Willeen Niece, MD [facility]   If 7PM-7AM, please contact night-coverage

## 2023-05-28 NOTE — Progress Notes (Signed)
   05/28/23 0155  Pain Assessment  Pain Scale 0-10  Pain Score 0  Provider Notification  Provider Name/Title Dr. Loney Loh  Date Provider Notified 05/28/23  Time Provider Notified 0155  Method of Notification Page  Notification Reason Change in status  Provider response See new orders  Date of Provider Response 05/28/23   Notified by Central Monitoring that patient's heart rate as low as 39 non-sustained.  He had been NSR with PAC in 60s and 70s.  Upon assessment, patient is asleep.  He awakens easily and has no complaints of dizziness or pain.  His b/p is 102/55 and HR is in the 40s.  Dr. Loney Loh made aware.  EKG obtained and Dr. Loney Loh made aware when it downloaded.  Will continue to monitor patient.  Bernie Covey RN

## 2023-05-28 NOTE — Plan of Care (Signed)
  Problem: Education: Goal: Knowledge of risk factors and measures for prevention of condition will improve Outcome: Progressing   Problem: Coping: Goal: Psychosocial and spiritual needs will be supported Outcome: Progressing   Problem: Respiratory: Goal: Will maintain a patent airway Outcome: Progressing Goal: Complications related to the disease process, condition or treatment will be avoided or minimized Outcome: Progressing   

## 2023-05-28 NOTE — ED Provider Notes (Signed)
[facility] Provider Note   CSN: [id number] Arrival date & time: 05/27/23  0522     History  Chief Complaint  Patient presents with   Shortness of Breath    Covid pos    Daniel Bailey. is a 73 y.o. male.  73 year old male presents from facility secondary to worsening breathing.  Patient was diagnosed with COVID 2 days ago was doing okay until this evening when his breathing a lot worse so EMS was called.  He was hypoxic on their arrival.  So he started on supplemental oxygen brought here for further evaluation.  Patient states feeling just unwell and generally weak.  Still does have a cough.   Shortness of Breath      Home Medications Prior to Admission medications   Medication Sig Start Date End Date Taking? Authorizing Provider  acetaminophen (TYLENOL) 500 MG tablet Place 2 tablets (1,000 mg total) into feeding tube every 8 (eight) hours as needed for moderate pain. 11/11/21  Yes Burnadette Pop, MD  ferrous sulfate 220 (44 Fe) MG/39ML solution Place 7 mLs into feeding tube daily.   Yes [provider]  LAGEVRIO 200 MG CAPS capsule Take 4 capsules by mouth 2 (two) times daily. 05/26/23  Yes [provider]  levothyroxine (SYNTHROID) 50 MCG tablet Place 50 mcg into feeding tube daily before breakfast.   Yes [provider]  loratadine (CLARITIN) 10 MG tablet Place 10 mg into feeding tube daily.   Yes [provider]  losartan (COZAAR) 25 MG tablet Take 12.5 mg by mouth daily. 04/23/23  Yes [provider]  magnesium hydroxide (MILK OF MAGNESIA) 400 MG/39ML suspension Place 30 mLs into feeding tube 2 (two) times daily as needed for moderate constipation.   Yes [provider]  midodrine (PROAMATINE) 5 MG tablet Place 1 tablet (5 mg total) into feeding tube 3 (three) times daily with meals. 11/11/21  Yes Burnadette Pop, MD  Nutritional Supplements (FEEDING SUPPLEMENT, JEVITY 1.5 CAL/FIBER,) LIQD Place 237 mLs  into feeding tube 5 (five) times daily.   Yes [provider]  omeprazole (PRILOSEC) 20 MG capsule Take 20 mg by mouth in the morning and at bedtime.   Yes [provider]  ondansetron (ZOFRAN-ODT) 4 MG disintegrating tablet 4 mg See admin instructions. Give 4 mg per tube three times daily as needed for nausea and vomiting 04/19/22  Yes [provider]  Water For Irrigation, Sterile (FREE WATER) SOLN Place 225 mLs into feeding tube every 6 (six) hours.   Yes [provider]  cefTRIAXone (ROCEPHIN) 2 g injection Inject 2 g into the muscle once. Reconstituted with lidocaine stat for leukocytosis, chills. Patient not taking: Reported on 05/27/2023    [provider]      Allergies    Patient has no known allergies.    Review of Systems   Review of Systems  Respiratory:  Positive for shortness of breath.     Physical Exam Updated Vital Signs BP (!) 102/55 (BP Location: Right Arm)   Pulse (!) 46   Temp 97.8 F (36.6 C) (Oral)   Resp 19   Ht 5\' 6"  (1.676 m)   Wt 89.2 kg   SpO2 96%   BMI 31.74 kg/m  Physical Exam Vitals and nursing note reviewed.  Constitutional:      Appearance: He is well-developed.  HENT:     Head: Normocephalic and atraumatic.  Cardiovascular:     Rate and Rhythm: Normal rate.  Pulmonary:     Effort: Pulmonary effort is normal. Tachypnea present. No respiratory distress.     Breath sounds: Decreased breath sounds and wheezing present.  Abdominal:     General: There is no distension.  Musculoskeletal:        General: Normal range of motion.     Cervical back: Normal range of motion.  Neurological:     Mental Status: He is alert.    ED Results / Procedures / Treatments   Labs (all labs ordered are listed, but only abnormal results are displayed) Labs Reviewed  COMPREHENSIVE METABOLIC PANEL - Abnormal; Notable for the following components:      Result Value   Glucose, Bld 113 (*)    BUN 38 (*)     Creatinine, Ser 1.52 (*)    Calcium 8.8 (*)    Albumin 2.1 (*)    ALT 60 (*)    Alkaline Phosphatase 148 (*)    GFR, Estimated 48 (*)    All other components within normal limits  CBC WITH DIFFERENTIAL/PLATELET - Abnormal; Notable for the following components:   RBC 2.67 (*)    Hemoglobin 8.5 (*)    HCT 26.0 (*)    RDW 17.1 (*)    All other components within normal limits  URINALYSIS, W/ REFLEX TO CULTURE (INFECTION SUSPECTED) - Abnormal; Notable for the following components:   Protein, ur 100 (*)    Bacteria, UA RARE (*)    All other components within normal limits  BRAIN NATRIURETIC PEPTIDE - Abnormal; Notable for the following components:   B Natriuretic Peptide 307.3 (*)    All other components within normal limits  HEMOGLOBIN AND HEMATOCRIT, BLOOD - Abnormal; Notable for the following components:   Hemoglobin 8.4 (*)    HCT 25.4 (*)    All other components within normal limits  PREALBUMIN - Abnormal; Notable for the following components:   Prealbumin 14 (*)    All other components within normal limits  CBC - Abnormal; Notable for the following components:   RBC 2.57 (*)    Hemoglobin 8.2 (*)    HCT 24.8 (*)    RDW 16.7 (*)    All other components within normal limits  COMPREHENSIVE METABOLIC PANEL - Abnormal; Notable for the following components:   Glucose, Bld 269 (*)    BUN 38 (*)    Calcium 8.6 (*)    Total Protein 6.4 (*)    Albumin 2.0 (*)    Alkaline Phosphatase 146 (*)    All other components within normal limits  CULTURE, BLOOD (ROUTINE X 2)  CULTURE, BLOOD (ROUTINE X 2)  SARS CORONAVIRUS 2 BY RT PCR  RESPIRATORY PANEL BY PCR  SARS CORONAVIRUS 2 BY RT PCR  MRSA NEXT GEN BY PCR, NASAL  PROTIME-INR  PROCALCITONIN  TSH  OCCULT BLOOD X 1 CARD TO LAB, STOOL  I-STAT CG4 LACTIC ACID, ED  I-STAT CG4 LACTIC ACID, ED  TYPE AND SCREEN    EKG None  Radiology CT ABDOMEN PELVIS WO CONTRAST Result Date: 05/27/2023 CLINICAL DATA:  Abdominal pain. Acute.  Positive COVID test. Fever and vomiting. EXAM: CT ABDOMEN AND PELVIS WITHOUT CONTRAST TECHNIQUE: Multidetector CT imaging of the abdomen and pelvis was performed following the standard protocol without IV contrast. RADIATION DOSE REDUCTION: This exam was performed according to the departmental dose-optimization program which includes automated exposure control, adjustment of the mA and/or kV according to patient size and/or use of iterative reconstruction technique. COMPARISON:  01/09/2023 FINDINGS: Lower chest:  Patchy areas of ground-glass attenuation identified within the imaged portions of both lungs. Consolidative changes identified within the posterior left lower lobe. Hepatobiliary: Unchanged low-density lesions within caudate lobe and lateral segment of left lobe of liver. These likely represent cysts. No suspicious liver abnormality identified. Small stones noted layering within the dependent portion of the gallbladder. No signs of gallbladder wall inflammation or bile duct dilatation. Pancreas: Unremarkable. No pancreatic ductal dilatation or surrounding inflammatory changes. Spleen: Normal in size without focal abnormality. Adrenals/Urinary Tract: Normal adrenal glands. No signs of nephrolithiasis, hydronephrosis or mass. No focal bladder abnormality. Stomach/Bowel: Percutaneous gastrostomy tube is identified within the stomach. No pathologic dilatation of the large or small bowel loops. The cecum is identified along the midline of the abdomen. The appendix is visualized and appears normal. No bowel wall thickening, inflammation, or distension. Moderate retained stool identified within the rectum. Vascular/Lymphatic: Mild aortic atherosclerosis. No signs of aneurysm. No abdominopelvic adenopathy. Reproductive: No mass identified. Other: Small amount of fluid is identified overlying the spleen within the left upper quadrant of the abdomen, nonspecific, image 24/3. No focal fluid collections identified to  suggest abscess. No signs of pneumoperitoneum. Fat containing umbilical hernia has a transverse diameter of 7 cm, image 61/3. There is also a fat containing right inguinal hernia which measures 8.1 cm, image 89/6. There is a trace amount of deep Linden fluid within the hernia sac as well as mild soft tissue stranding within the herniated fat. Musculoskeletal: No acute or significant osseous findings. Scoliosis and degenerative disc disease. IMPRESSION: 1. Patchy areas of ground-glass attenuation identified within the imaged portions of both lungs. Consolidative changes identified within the posterior left lower lobe. Findings are compatible with multifocal pneumonia. Atypical pneumonia not excluded. 2. Small amount of fluid is identified overlying the spleen within the left upper quadrant of the abdomen, nonspecific. No focal fluid collections identified to suggest abscess. 3. Fat containing umbilical hernia has a transverse diameter of 7 cm. There is also a fat containing right inguinal hernia which measures 8.1 cm. There is a trace amount of deep Linden fluid within the hernia sac as well as mild soft tissue stranding within the herniated fat. Correlate for any clinical signs or symptoms of incarceration. 4. Cholelithiasis. 5. Moderate retained stool identified within the rectum. 6.  Aortic Atherosclerosis (ICD10-I70.0). Electronically Signed   By: Signa Kell M.D.   On: 05/27/2023 12:49   DG Chest Portable 1 View Result Date: 05/27/2023 CLINICAL DATA:  Hypoxia.  COVID-19 positive. EXAM: PORTABLE CHEST 1 VIEW COMPARISON:  01/07/2023 FINDINGS: Lungs are adequately inflated demonstrate patchy hazy airspace opacification bilaterally likely representing viral pneumonia in this patient who is COVID-19 positive. No effusion or pneumothorax. Cardiomediastinal silhouette and remainder of the exam is unchanged. IMPRESSION: Patchy hazy airspace opacification bilaterally likely representing viral pneumonia in this  patient who is COVID-19 positive. Electronically Signed   By: Elberta Fortis M.D.   On: 05/27/2023 07:59    Procedures .Critical Care  Performed by: Marily Memos, MD Authorized by: Marily Memos, MD   Critical care provider statement:    Critical care time (minutes):  30   Critical care was necessary to treat or prevent imminent or life-threatening deterioration of the following conditions:  Respiratory failure   Critical care was time spent personally by me on the following activities:  Development of treatment plan with patient or surrogate, discussions with consultants, evaluation of patient's response to treatment, examination of patient, ordering and review of laboratory studies, ordering and review  of radiographic studies, ordering and performing treatments and interventions, pulse oximetry, re-evaluation of patient's condition and review of old charts     Medications Ordered in ED Medications  dexamethasone (DECADRON) injection 6 mg (6 mg Intravenous Given 05/27/23 0804)  HYDROcodone-acetaminophen (NORCO/VICODIN) 5-325 MG per tablet 1 tablet (1 tablet Per Tube Not Given 05/27/23 0918)  levothyroxine (SYNTHROID) tablet 50 mcg (has no administration in time range)  midodrine (PROAMATINE) tablet 5 mg (5 mg Per Tube Given 05/27/23 1712)  feeding supplement (JEVITY 1.5 CAL/FIBER) liquid 237 mL (237 mLs Per Tube Given 05/27/23 2313)  free water 225 mL (225 mLs Per Tube Given 05/27/23 2314)  sodium chloride flush (NS) 0.9 % injection 3 mL (3 mLs Intravenous Not Given 05/27/23 2313)  acetaminophen (TYLENOL) tablet 650 mg (has no administration in time range)    Or  acetaminophen (TYLENOL) suppository 650 mg (has no administration in time range)  ondansetron (ZOFRAN) tablet 4 mg (has no administration in time range)    Or  ondansetron (ZOFRAN) injection 4 mg (has no administration in time range)  albuterol (PROVENTIL) (2.5 MG/3ML) 0.083% nebulizer solution 2.5 mg (2.5 mg Nebulization Not Given  05/27/23 1058)  lipase/protease/amylase) (VIOKACE) tablets 20,880 Units (20,880 Units Per Tube Not Given 05/27/23 1431)    And  sodium bicarbonate tablet 650 mg (650 mg Per Tube Not Given 05/27/23 1431)  cefTRIAXone (ROCEPHIN) 2 g in sodium chloride 0.9 % 100 mL IVPB (0 g Intravenous Stopped 05/27/23 1300)  azithromycin (ZITHROMAX) 500 mg in sodium chloride 0.9 % 250 mL IVPB (0 mg Intravenous Duplicate 05/27/23 1313)  ferrous sulfate 220 (44 Fe) MG/5ML solution 308 mg (308 mg Per Tube Given 05/27/23 1441)  loratadine (CLARITIN) tablet 10 mg (10 mg Per Tube Given 05/27/23 1425)  senna-docusate (Senokot-S) tablet 1 tablet (1 tablet Oral Given 05/27/23 2313)  polyethylene glycol (MIRALAX / GLYCOLAX) packet 17 g (17 g Per Tube Given 05/27/23 1425)  pantoprazole (PROTONIX) injection 40 mg (40 mg Intravenous Given 05/27/23 2312)  Oral care mouth rinse (has no administration in time range)  Oral care mouth rinse (has no administration in time range)  remdesivir 200 mg in sodium chloride 0.9% 250 mL IVPB (0 mg Intravenous Stopped 05/27/23 1044)  sorbitol, magnesium hydroxide, mineral oil, glycerin (SMOG) enema (300 mLs Rectal Given 05/27/23 1456)    ED Course/ Medical Decision Making/ A&P                                 Medical Decision Making Amount and/or Complexity of Data Reviewed Labs: ordered. Radiology: ordered.  Risk Prescription drug management. Decision regarding hospitalization.   Acute respiratory failure with hypoxia likely related to COVID infection.  Requiring 3 L oxygen to keep it above 95.  Discussed with pharmacy and went and used remdesivir and steroids based on his clinical status.  Discussed with Dr. Katrinka Blazing for admission.  Chest x-ray as expected.  Final Clinical Impression(s) / ED Diagnoses Final diagnoses:  Acute respiratory failure with hypoxia [facility])    Rx / DC Orders ED Discharge Orders     None         Hibba Schram, Barbara Cower, MD 05/28/23 [phone]

## 2023-05-28 NOTE — Progress Notes (Signed)
Telemetry called, HR in 30's, SB, pt. Sleeping, O2 sat 92% on R/A. Arouses easily and HR increasing to 50's, asymptomatic, MD aware

## 2023-05-28 NOTE — Evaluation (Signed)
Physical Therapy Evaluation and Discharge Patient Details Name: Daniel Bailey. MRN: [id number] DOB: 30-Apr-1951 Today's Date: 05/28/2023  History of Present Illness  Daniel Edwardsen. is a 73 y.o. male  who presents with progressively worsening shortness of breath and cough, recent covid + test; noteworthy that he had 2 negative covid tests while here this admission;  with medical history significant of hypertension, hyperlipidemia, chronic hypotension, dysphagia with recurrent aspiration s/p G tube, colon cancer, diabetes mellitus type 2, intellectual disability, history of GI bleed thought secondary esophagitis and/or gastritis  Clinical Impression   Patient evaluated by Physical Therapy with no further acute PT needs identified, as pt is at baseline functional status; reports at [facility] they use the mechanical lift to help him OOB.  All education has been completed and the patient has no further questions. He participates well and seemed to enjoy moving and AROM exercises in the recliner. Worth considering consulting PT at SNF to keep working on functional mobility, and potentially decr caregiver burden.  See below for any follow-up Physical Therapy or equipment needs. PT is signing off. Thank you for this referral.     Will ask Mobility Team to assist pt OOB with the Maximove (daily would be optimal)      If plan is discharge home, recommend the following:     Can travel by private vehicle   No    Equipment Recommendations    Recommendations for Other Services       Functional Status Assessment Patient has not had a recent decline in their functional status     Precautions / Restrictions Precautions Precautions: Fall Precaution Comments: also NPO Restrictions Weight Bearing Restrictions Per Provider Order: No      Mobility  Bed Mobility                    Transfers Overall transfer level: Needs assistance Equipment used: Ambulation equipment used Transfers: Bed  to chair/wheelchair/BSC             General transfer comment: Nursing had lifted pt to the recliner earlier Transfer via Lift Equipment: Maximove  Ambulation/Gait                  Stairs            Wheelchair Mobility     Tilt Bed    Modified Rankin (Stroke Patients Only)       Balance Overall balance assessment: Needs assistance Sitting-balance support: Feet supported, Bilateral upper extremity supported Sitting balance-Leahy Scale: Fair Sitting balance - Comments: Able to pull from supported sittign to unsupported sitting in recliner; Once sitting upright in recliner, pt coughed up yellow plegm; still able to perform some exercises in teh recliner                                     Pertinent Vitals/Pain Pain Assessment Pain Assessment: No/denies pain    Home Living Family/patient expects to be discharged to:: Skilled nursing facility                   Additional Comments: [facility]    Prior Function               Mobility Comments: [facility] staff uses lift for OOB to wheelchair; spends most of the day OOB in the wheelchair ADLs Comments: reports performing grooming tasks independently, staff assists with bathing, pericare, dressing  Extremity/Trunk Assessment   Upper Extremity Assessment Upper Extremity Assessment: Generalized weakness    Lower Extremity Assessment Lower Extremity Assessment: Generalized weakness       Communication   Communication Communication: Difficulty communicating thoughts/reduced clarity of speech  Cognition Arousal: Alert Behavior During Therapy: WFL for tasks assessed/performed Overall Cognitive Status: No family/caregiver present to determine baseline cognitive functioning                                          General Comments General comments (skin integrity, edema, etc.): productive cough    Exercises     Assessment/Plan    PT Assessment Patient  does not need any further PT services  PT Problem List         PT Treatment Interventions      PT Goals (Current goals can be found in the Care Plan section)  Acute Rehab PT Goals Patient Stated Goal: Agrees to do some moving PT Goal Formulation: All assessment and education complete, DC therapy    Frequency       Co-evaluation               AM-PAC PT "6 Clicks" Mobility  Outcome Measure Help needed turning from your back to your side while in a flat bed without using bedrails?: A Lot Help needed moving from lying on your back to sitting on the side of a flat bed without using bedrails?: A Lot Help needed moving to and from a bed to a chair (including a wheelchair)?: Total Help needed standing up from a chair using your arms (e.g., wheelchair or bedside chair)?: Total Help needed to walk in hospital room?: Total Help needed climbing 3-5 steps with a railing? : Total 6 Click Score: 8    End of Session   Activity Tolerance: Patient tolerated treatment well Patient left: in chair;with call bell/phone within reach Nurse Communication: Mobility status PT Visit Diagnosis: Other abnormalities of gait and mobility (R26.89)    Time: 3086-5784 PT Time Calculation (min) (ACUTE ONLY): 10 min   Charges:   PT Evaluation $PT Eval Low Complexity: 1 Low   PT General Charges $$ ACUTE PT VISIT: 1 Visit         Van Clines, PT  Acute Rehabilitation Services Office [phone] Secure Chat welcomed   Levi Aland 05/28/2023, 3:52 PM

## 2023-05-29 DIAGNOSIS — J9601 Acute respiratory failure with hypoxia: Secondary | ICD-10-CM | POA: Diagnosis not present

## 2023-05-29 LAB — GLUCOSE, CAPILLARY
Glucose-Capillary: 273 mg/dL — ABNORMAL HIGH (ref 70–99)
Glucose-Capillary: 221 mg/dL — ABNORMAL HIGH (ref 70–99)
Glucose-Capillary: 242 mg/dL — ABNORMAL HIGH (ref 70–99)
Glucose-Capillary: 240 mg/dL — ABNORMAL HIGH (ref 70–99)

## 2023-05-29 MED ORDER — FERROUS SULFATE 300 (60 FE) MG/5ML PO SOLN
300.0000 mg | Freq: Every day | ORAL | Status: DC
  Administered 2023-05-29 – 2023-05-31 (×3): 300 mg
  Filled 2023-05-29 (×3): qty 5

## 2023-05-29 NOTE — Progress Notes (Signed)
Mobility Specialist Progress Note:    05/29/23 1034  Mobility  Activity Transferred from bed to chair  Level of Assistance +2 (takes two people)  Assistive Device MaxiMove  Activity Response Tolerated well  Mobility Referral Yes  Mobility visit 1 Mobility  Mobility Specialist Start Time (ACUTE ONLY) 0940  Mobility Specialist Stop Time (ACUTE ONLY) 1002  Mobility Specialist Time Calculation (min) (ACUTE ONLY) 22 min   Pt received in bed and agreeable. No complaints throughout transfer. Pt left in chair with call bell and RN present.  D'Vante Earlene Plater Mobility Specialist Please contact via Special educational needs teacher or Rehab office at [phone]

## 2023-05-29 NOTE — TOC Initial Note (Signed)
Transition of Care (TOC) - Initial/Assessment Note    Patient Details  Name: Daniel Bailey. MRN: [id number] Date of Birth: August 30, 1950  Transition of [facility]) CM/SW Contact:    Lawerance Sabal, RN Phone Number: 05/29/2023, 12:08 PM  Clinical Narrative:                  Patient admitted from LTC at [facility].  Patient has legal guardian Admitted w resp failure, has acute O2 need, continues IV Abx.  Medical history of intellectual disability, bed bound, g-tube.  TOC will continue to follow  Anticipate return to [facility] via ambulance when medically stable   Expected Discharge Plan: Skilled Nursing Facility Barriers to Discharge: Undocumented   Patient Goals and CMS Choice Patient states their goals for this hospitalization and ongoing recovery are:: Patient has legal guardian          Expected Discharge Plan and Services In-house Referral: Clinical Social Work     Living arrangements for the past 2 months: Skilled Nursing Facility                                      Prior Living Arrangements/Services Living arrangements for the past 2 months: Skilled Nursing Facility                     Activities of Daily Living   ADL Screening (condition at time of admission) Independently performs ADLs?: No Does the patient have a NEW difficulty with bathing/dressing/toileting/self-feeding that is expected to last >3 days?: No Does the patient have a NEW difficulty with getting in/out of bed, walking, or climbing stairs that is expected to last >3 days?: No Does the patient have a NEW difficulty with communication that is expected to last >3 days?: No Is the patient deaf or have difficulty hearing?: No Does the patient have difficulty seeing, even when wearing glasses/contacts?: No Does the patient have difficulty concentrating, remembering, or making decisions?: Yes  Permission Sought/Granted                  Emotional Assessment               Admission diagnosis:  Acute respiratory failure with hypoxia (HCC) [J96.01] Patient Active Problem List   Diagnosis Date Noted   Acute respiratory failure with hypoxia (HCC) 05/27/2023   Acute on chronic renal insufficiency 05/27/2023   CAP (community acquired pneumonia) 05/27/2023   Normocytic anemia 05/27/2023   Abdominal pain 05/27/2023   Dysphagia 05/27/2023   Hypothyroidism 05/27/2023   GERD with esophagitis 05/27/2023   Feeding by G-tube (HCC) 01/08/2023   Acute encephalopathy 12/12/2022   Coffee ground emesis 04/20/2022   Gastritis and gastroduodenitis 04/20/2022   Upper GI bleed 04/20/2022   Acute upper GI bleed 04/19/2022   Elevated alkaline phosphatase level 04/19/2022   GERD (gastroesophageal reflux disease) 09/02/2021   Normal anion gap metabolic acidosis 09/02/2021   Hypoalbuminemia 09/02/2021   Right bundle branch block 08/12/2021   Right ventricular hypertrophy 08/09/2021   Bradycardia 08/09/2021   Urinary frequency 08/09/2021   OSA (obstructive sleep apnea) 07/16/2021   Hypothermia 07/15/2021   Lactic acidosis 07/13/2021   Acute metabolic encephalopathy 07/13/2021   AKI (acute kidney injury) (HCC)    Chronic hypotension    Decreased GFR 05/26/2021   Iron deficiency anemia 05/26/2021   Cerumen impaction 05/17/2021   Frequent urination 05/17/2021   Tremor 02/09/2021  Generalized weakness 02/09/2021   Elevated PSA 09/05/2017   Leukocytopenia 09/01/2016   Mixed hyperlipidemia due to type 2 diabetes mellitus (HCC) 02/19/2014   Encounter for Medicare annual wellness exam 08/27/2012   Obesity 08/18/2011   Prostate cancer screening 08/10/2011   Controlled type 2 diabetes mellitus without complication, without long-term current use of insulin (HCC) 05/30/2010   HYPERTENSION, BENIGN ESSENTIAL 12/10/2006   Intellectual disability 11/23/2006   PCP:  [name], MD Pharmacy:   [facility] [phone] - [address], [place] - [address] [place] [address] Phone: [phone] Fax: [phone]     Social Drivers of Health (SDOH) Social History: SDOH Screenings   Food Insecurity: No Food Insecurity (05/27/2023)  Housing: Low Risk  (05/27/2023)  Transportation Needs: No Transportation Needs (05/27/2023)  Utilities: Not At Risk (05/27/2023)  Alcohol Screen: Low Risk  (03/21/2022)  Depression (PHQ2-9): Low Risk  (03/21/2022)  Financial Resource Strain: Low Risk  (03/21/2022)  Physical Activity: Insufficiently Active (03/21/2022)  Social Connections: Socially Isolated (05/27/2023)  Stress: No Stress Concern Present (03/21/2022)  Tobacco Use: Low Risk  (05/27/2023)   SDOH Interventions:     Readmission Risk Interventions     No data to display

## 2023-05-29 NOTE — Progress Notes (Signed)
PROGRESS NOTE    Daniel Bailey.  NWG:[id number] DOB: [birth year] DOA: 05/27/2023 PCP: [name], MD   Brief Narrative:  This 73 yrs old male with medical history significant of hypertension, hyperlipidemia, chronic hypotension, dysphagia with recurrent aspiration s/p G tube, colon cancer, diabetes mellitus type 2,  intellectual disability, history of GI bleed thought secondary esophagitis and/or gastritis who presents with progressively worsening shortness of breath and cough. At baseline patient does not require oxygen and usually in a wheelchair for mobility, but receives physical therapy.  Patient was found to be febrile on arrival in the ED and was hypoxic requiring 15 L of oxygen on a nonrebreather.  Pertinent labs include hemoglobin 8.5, creatinine 1.52 chest x-ray noted patchy airspace opacification bilaterally likely viral pneumonia. Patient was started on Decadron and IV remdesivir because of positive COVID test. Subsequent two COVID test were negative. Remdesivir and Decadron discontinued.  Assessment & Plan:   Principal Problem:   Acute respiratory failure with hypoxia (HCC) Active Problems:   CAP (community acquired pneumonia)   Normocytic anemia   Chronic hypotension   AKI (acute kidney injury) (HCC)   Abdominal pain   Feeding by G-tube (HCC)   Dysphagia   Hypothyroidism   Intellectual disability   GERD with esophagitis   Obesity  Acute hypoxic respiratory failure secondary to community-acquired pneumonia: Patient presented due to worsening shortness of breath and cough, hypoxia requiring 2L/ min.   He had initially been diagnosed with COVID-19 at the facility on 1/16.   Continue supplemental oxygen and wean as tolerated.  Chest x-ray noted patchy airspace opacification bilaterally.   Patient had been started on Decadron IV and remdesivir.   COVID-19 screening here in the ED however was noted to be negative.  Aspiration precautions with elevation head of the bed  greater than 30 degrees Incentive spirometry and flutter valve if able to use Recheck COVID swab negative. Decadron and remdesivir discontinued. Continue Mucinex Continue empiric antibiotic Rocephin and azithromycin.   Normocytic anemia: History of GI bleed Acute on chronic. Hemoglobin 8.5 which appears lower than prior of 10.5 back in 01/2023.   Patient has prior history of GI bleed related to gastritis based off EGD back in 04/2022. Type and screen for possible need of blood products. Stool  occult blood negative. There is no any obvious visible bleeding.  Monitor H&H  Chronic hypotension: Blood pressures noted to be as low as 93/62. Continue midodrine Held losartan   Acute kidney injury: > Resolved. Baseline creatinine 1.1-1.3.  Creatinine on admission 1.52. Renal functions back to normal.   Abdominal pain: Acute.  Patient did report complaints of abdominal pain and on exam seem to have left lower quadrant tenderness. CT A/P: Findings consistent with multifocal pneumonia. Continue Hydrocodone  per tube as needed for pain.   Dysphagia S/p PEG tube Hypoalbuminemia Patient on chronic tube feeds due to history of dysphagia with recurrent aspiration.   Albumin noted to be low at 2.1 Continue feeding  Registered dietitian consult for adjustment of tube feeds Continue free water   Hypothyroidism: Continue levothyroxine.   Intellectual disability: Legal guardian notes that history is sometimes unreliable from the patient.   GERD with esophagitis: Patient with prior history of LA grade D esophagitis as well as patchy gastritis noted on EGD from 04/2022. Continue pantoprazole.  Obesity BMI 31.744 kg/m. Diet explained in detail.  DVT prophylaxis: SCDs Code Status: Full code Family Communication: No family at bed side Disposition Plan:    Status is:  Inpatient Remains inpatient appropriate because: Severity of the disease.    Consultants:  None  Procedures:  None  Antimicrobials:  Anti-infectives (From admission, onward)    Start     Dose/Rate Route Frequency Ordered Stop   05/28/23 1000  remdesivir 100 mg in sodium chloride 0.9 % 100 mL IVPB  Status:  Discontinued       Placed in "Followed by" Linked Group   100 mg 200 mL/hr over 30 Minutes Intravenous Daily 05/27/23 0654 05/27/23 0956   05/27/23 1015  cefTRIAXone (ROCEPHIN) 2 g in sodium chloride 0.9 % 100 mL IVPB        2 g 200 mL/hr over 30 Minutes Intravenous Every 24 hours 05/27/23 1000     05/27/23 1015  azithromycin (ZITHROMAX) 500 mg in sodium chloride 0.9 % 250 mL IVPB        500 mg 250 mL/hr over 60 Minutes Intravenous Every 24 hours 05/27/23 1000 06/01/23 1014   05/27/23 0800  remdesivir 200 mg in sodium chloride 0.9% 250 mL IVPB       Placed in "Followed by" Linked Group   200 mg 580 mL/hr over 30 Minutes Intravenous Once 05/27/23 0654 05/27/23 1044        Subjective: Patient was seen and examined at bedside.  Overnight events noted. Patient reports feeling better, reports cough is getting better.  Objective: Vitals:   05/28/23 1535 05/28/23 2029 05/29/23 0510 05/29/23 0739  BP: 103/76 (!) 116/47 (!) 123/49 (!) 112/52  Pulse: (!) 44 (!) 40 (!) 46 (!) 53  Resp:  20 20   Temp: 98 F (36.7 C) (!) 97.5 F (36.4 C) (!) 97.4 F (36.3 C) (!) 97.5 F (36.4 C)  TempSrc: Oral Oral Oral Oral  SpO2: 92% 100% 100% 96%  Weight:      Height:        Intake/Output Summary (Last 24 hours) at 05/29/2023 1359 Last data filed at 05/29/2023 0655 Gross per 24 hour  Intake 1037 ml  Output 1350 ml  Net -313 ml   Filed Weights   05/27/23 0531 05/27/23 1219  Weight: 83.9 kg 89.2 kg    Examination:  General exam: Appears calm and comfortable, deconditioned, not in any acute distress. Respiratory system: CTA Bilaterally. Respiratory effort normal.  RR 18 Cardiovascular system: S1 & S2 heard, RRR. No JVD, murmurs, rubs, gallops or clicks.  Gastrointestinal system: Abdomen is  nondistended, soft and nontender. Normal bowel sounds heard. Central nervous system: Alert and oriented x 2. No focal neurological deficits. Extremities: No edema, no cyanosis, no clubbing Skin: No rashes, lesions or ulcers Psychiatry: Judgement and insight appear normal. Mood & affect appropriate.     Data Reviewed: I have personally reviewed following labs and imaging studies  CBC: Recent Labs  Lab 05/27/23 0545 05/27/23 1321 05/28/23 0106  WBC 7.2  --  8.6  NEUTROABS 6.0  --   --   HGB 8.5* 8.4* 8.2*  HCT 26.0* 25.4* 24.8*  MCV 97.4  --  96.5  PLT 172  --  182   Basic Metabolic Panel: Recent Labs  Lab 05/27/23 0545 05/28/23 0106  NA 138 139  K 3.9 3.9  CL 105 106  CO2 24 24  GLUCOSE 113* 269*  BUN 38* 38*  CREATININE 1.52* 1.15  CALCIUM 8.8* 8.6*   GFR: Estimated Creatinine Clearance: 59.9 mL/min (by C-G formula based on SCr of 1.15 mg/dL). Liver Function Tests: Recent Labs  Lab 05/27/23 0545 05/28/23 0106  AST 39 23  ALT 60* 44  ALKPHOS 148* 146*  BILITOT 0.7 0.3  PROT 6.7 6.4*  ALBUMIN 2.1* 2.0*   No results for input(s): "LIPASE", "AMYLASE" in the last 168 hours. No results for input(s): "AMMONIA" in the last 168 hours. Coagulation Profile: Recent Labs  Lab 05/27/23 0545  INR 1.1   Cardiac Enzymes: No results for input(s): "CKTOTAL", "CKMB", "CKMBINDEX", "TROPONINI" in the last 168 hours. BNP (last 3 results) No results for input(s): "PROBNP" in the last 8760 hours. HbA1C: Recent Labs    05/28/23 0903  HGBA1C 6.2*   CBG: Recent Labs  Lab 05/28/23 1219 05/28/23 1624 05/28/23 2034 05/29/23 0739 05/29/23 1231  GLUCAP 308* 324* 260* 273* 221*   Lipid Profile: No results for input(s): "CHOL", "HDL", "LDLCALC", "TRIG", "CHOLHDL", "LDLDIRECT" in the last 72 hours. Thyroid Function Tests: Recent Labs    05/27/23 0545  TSH 1.696   Anemia Panel: No results for input(s): "VITAMINB12", "FOLATE", "FERRITIN", "TIBC", "IRON", "RETICCTPCT"  in the last 72 hours. Sepsis Labs: Recent Labs  Lab 05/27/23 0543 05/27/23 0653 05/27/23 0733  PROCALCITON 5.60  --   --   LATICACIDVEN  --  1.4 1.1    Recent Results (from the past 240 hours)  Culture, blood (Routine x 2)     Status: None (Preliminary result)   Collection Time: 05/27/23  5:40 AM   Specimen: BLOOD RIGHT ARM  Result Value Ref Range Status   Specimen Description BLOOD RIGHT ARM  Final   Special Requests   Final    BOTTLES DRAWN AEROBIC ONLY Blood Culture results may not be optimal due to an inadequate volume of blood received in culture bottles   Culture   Final    NO GROWTH 2 DAYS Performed at [facility], [address], [place], [place] [address]    Report Status PENDING  Incomplete  Culture, blood (Routine x 2)     Status: None (Preliminary result)   Collection Time: 05/27/23  5:43 AM   Specimen: BLOOD RIGHT ARM  Result Value Ref Range Status   Specimen Description BLOOD RIGHT ARM  Final   Special Requests   Final    BOTTLES DRAWN AEROBIC AND ANAEROBIC Blood Culture adequate volume   Culture   Final    NO GROWTH 2 DAYS Performed at [facility], [address], [place], [place] [address]    Report Status PENDING  Incomplete  SARS Coronavirus 2 by RT PCR (hospital order, performed in [facility] hospital lab) *cepheid single result test* Anterior Nasal Swab     Status: None   Collection Time: 05/27/23  6:34 AM   Specimen: Anterior Nasal Swab  Result Value Ref Range Status   SARS Coronavirus 2 by RT PCR NEGATIVE NEGATIVE Final    Comment: Performed at [facility], [address] [place], [place] [address]  Respiratory (~20 pathogens) panel by PCR     Status: None   Collection Time: 05/27/23  9:56 AM   Specimen: Nasopharyngeal Swab; Respiratory  Result Value Ref Range Status   Adenovirus NOT DETECTED NOT DETECTED Final   Coronavirus 229E NOT DETECTED NOT DETECTED Final    Comment: (NOTE) The Coronavirus on the Respiratory Panel,  DOES NOT test for the novel  Coronavirus (2019 nCoV)    Coronavirus HKU1 NOT DETECTED NOT DETECTED Final   Coronavirus NL63 NOT DETECTED NOT DETECTED Final   Coronavirus OC43 NOT DETECTED NOT DETECTED Final   Metapneumovirus NOT DETECTED NOT DETECTED Final  Rhinovirus / Enterovirus NOT DETECTED NOT DETECTED Final   Influenza A NOT DETECTED NOT DETECTED Final   Influenza B NOT DETECTED NOT DETECTED Final   Parainfluenza Virus 1 NOT DETECTED NOT DETECTED Final   Parainfluenza Virus 2 NOT DETECTED NOT DETECTED Final   Parainfluenza Virus 3 NOT DETECTED NOT DETECTED Final   Parainfluenza Virus 4 NOT DETECTED NOT DETECTED Final   Respiratory Syncytial Virus NOT DETECTED NOT DETECTED Final   Bordetella pertussis NOT DETECTED NOT DETECTED Final   Bordetella Parapertussis NOT DETECTED NOT DETECTED Final   Chlamydophila pneumoniae NOT DETECTED NOT DETECTED Final   Mycoplasma pneumoniae NOT DETECTED NOT DETECTED Final    Comment: Performed at [facility], [address] [place], [place] [address]  SARS Coronavirus 2 by RT PCR (hospital order, performed in [facility] hospital lab) *cepheid single result test* Anterior Nasal Swab     Status: None   Collection Time: 05/27/23 12:41 PM   Specimen: Anterior Nasal Swab  Result Value Ref Range Status   SARS Coronavirus 2 by RT PCR NEGATIVE NEGATIVE Final    Comment: Performed at [facility], [address], [place], [place] [address]  MRSA Next Gen by PCR, Nasal     Status: None   Collection Time: 05/27/23  1:39 PM   Specimen: Nasal Mucosa; Nasal Swab  Result Value Ref Range Status   MRSA by PCR Next Gen NOT DETECTED NOT DETECTED Final    Comment: (NOTE) The GeneXpert MRSA Assay (FDA approved for NASAL specimens only), is one component of a comprehensive MRSA colonization surveillance program. It is not intended to diagnose MRSA infection nor to guide or monitor treatment for MRSA infections. Test performance is not FDA approved in  patients less than 29 years old. Performed at [facility], [address], [place], [place] [address]     Radiology Studies: No results found.  Scheduled Meds:  feeding supplement (JEVITY 1.5 CAL/FIBER)  237 mL Per Tube 5 X Daily   feeding supplement (PROSource TF20)  60 mL Per Tube Daily   ferrous sulfate  300 mg Per Tube Daily   free water  225 mL Per Tube Q6H   insulin aspart  0-15 Units Subcutaneous TID WC   insulin aspart  0-5 Units Subcutaneous QHS   levothyroxine  50 mcg Per Tube Q0600   lipase/protease/amylase)  20,880 Units Per Tube Once   And   sodium bicarbonate  650 mg Per Tube Once   loratadine  10 mg Per Tube Daily   midodrine  5 mg Per Tube TID WC   mouth rinse  15 mL Mouth Rinse 4 times per day   pantoprazole (PROTONIX) IV  40 mg Intravenous Q12H   polyethylene glycol  17 g Per Tube Daily   senna-docusate  1 tablet Per Tube BID   sodium chloride flush  3 mL Intravenous Q12H   Continuous Infusions:  azithromycin 500 mg (05/29/23 1005)   cefTRIAXone (ROCEPHIN)  IV 2 g (05/29/23 0847)     LOS: 2 days    Time spent: 35 mins    Willeen Niece, MD [facility]   If 7PM-7AM, please contact night-coverage

## 2023-05-30 DIAGNOSIS — J9601 Acute respiratory failure with hypoxia: Secondary | ICD-10-CM | POA: Diagnosis not present

## 2023-05-30 LAB — GLUCOSE, CAPILLARY
Glucose-Capillary: 198 mg/dL — ABNORMAL HIGH (ref 70–99)
Glucose-Capillary: 170 mg/dL — ABNORMAL HIGH (ref 70–99)
Glucose-Capillary: 133 mg/dL — ABNORMAL HIGH (ref 70–99)
Glucose-Capillary: 187 mg/dL — ABNORMAL HIGH (ref 70–99)
Glucose-Capillary: 148 mg/dL — ABNORMAL HIGH (ref 70–99)

## 2023-05-30 NOTE — Plan of Care (Signed)

## 2023-05-30 NOTE — Progress Notes (Signed)
Mobility Specialist Progress Note:    05/30/23 1300  Mobility  Activity Transferred from chair to bed  Level of Assistance +2 (takes two people)  Assistive Device MaxiMove  Activity Response Tolerated well  Mobility Referral Yes  Mobility visit 1 Mobility  Mobility Specialist Start Time (ACUTE ONLY) 1325  Mobility Specialist Stop Time (ACUTE ONLY) 1338  Mobility Specialist Time Calculation (min) (ACUTE ONLY) 13 min   Pt received in chair, eager to return to bed. Found to have had BM in chair, RN aware. Asymptomatic w/ no complaints throughout transfer. Pt left in bed with call bell and RN present.  [place]'Vante Earlene Plater Mobility Specialist Please contact via Special educational needs teacher or Rehab office at [phone]

## 2023-05-30 NOTE — Progress Notes (Signed)
PROGRESS NOTE    Daniel Bailey.  ZOX:[id number] DOB: 1951/04/08 DOA: 05/27/2023 PCP: [name], MD   Brief Narrative:  73 yrs old male with medical history significant of hypertension, hyperlipidemia, chronic hypotension, dysphagia with recurrent aspiration s/p G tube, colon cancer, diabetes mellitus type 2,  intellectual disability, history of GI bleed thought secondary to esophagitis and/or gastritis presented with worsening shortness of breath and cough.  On presentation, patient was febrile and required 15 L oxygen via nonrebreather.  Chest x-ray showed patchy airspace opacification bilaterally.  Patient was initially started on IV remdesivir and Decadron because of positive COVID test.  Subsequently 2 COVID tests were negative remdesivir and Decadron discontinued.  He is currently on IV antibiotics.  Assessment & Plan:   Acute respiratory failure with hypoxia Community-acquired pneumonia -Required oxygen by nonrebreather on presentation.  Chest x-ray showed patchy airspace opacification bilaterally. -Patient was initially started on IV remdesivir and Decadron because of positive COVID test.  Subsequently 2 COVID tests were negative remdesivir and Decadron discontinued.   -Currently on Rocephin and Zithromax.  Blood cultures have been negative so far. -Currently on 2 L oxygen via nasal cannula.  Wean off as able.  Anemia of chronic disease History of GI bleed -From chronic illnesses.  Hemoglobin  currently stable.  Monitor H&H intermittently.  Acute kidney injury -Baseline creatinine of 1.1-1.3.  Creatinine on admission 1.52.  Resolved.  No labs today.  Chronic hypotension -Blood pressure intermittently on the lower side.  Continue midodrine.  Losartan on hold.  Abdominal pain -CT of abdomen and pelvis did not show any acute abnormality.  Continue as needed pain medications  Dysphagia status post PEG tube Hypoalbuminemia -Continue tube feeds as per dietitian recommendations.   Continue free water.  Intellectual disability -Outpatient follow-up.  Fall precautions.  Has a legal guardian.  Hypothyroidism -Continue levothyroxine  GERD with history of esophagitis -Continue PPI  Obesity -Outpatient follow-up  Physical deconditioning -Patient is from long-term care facility.  PT recommended PT reevaluation at long-term care facility.  TOC following.   DVT prophylaxis: SCDs Code Status: DNR Family Communication: None at bedside Disposition Plan: Status is: Inpatient Remains inpatient appropriate because: Of severity of illness  Consultants: None  Procedures: None  Antimicrobials:  Anti-infectives (From admission, onward)    Start     Dose/Rate Route Frequency Ordered Stop   05/28/23 1000  remdesivir 100 mg in sodium chloride 0.9 % 100 mL IVPB  Status:  Discontinued       Placed in "Followed by" Linked Group   100 mg 200 mL/hr over 30 Minutes Intravenous Daily 05/27/23 0654 05/27/23 0956   05/27/23 1015  cefTRIAXone (ROCEPHIN) 2 g in sodium chloride 0.9 % 100 mL IVPB        2 g 200 mL/hr over 30 Minutes Intravenous Every 24 hours 05/27/23 1000     05/27/23 1015  azithromycin (ZITHROMAX) 500 mg in sodium chloride 0.9 % 250 mL IVPB        500 mg 250 mL/hr over 60 Minutes Intravenous Every 24 hours 05/27/23 1000 06/01/23 1014   05/27/23 0800  remdesivir 200 mg in sodium chloride 0.9% 250 mL IVPB       Placed in "Followed by" Linked Group   200 mg 580 mL/hr over 30 Minutes Intravenous Once 05/27/23 0654 05/27/23 1044        Subjective: Patient seen and examined at bedside.  Poor historian.  No fever, seizures, agitation reported.  Objective: Vitals:   05/29/23 2051  05/30/23 0608 05/30/23 0611 05/30/23 0635  BP: (!) 112/52 (!) 81/49 (!) 82/46 (!) 88/46  Pulse: (!) 43 83 65 63  Resp: 18 18 18    Temp: 98.5 F (36.9 C) 98.4 F (36.9 C) 98.4 F (36.9 C)   TempSrc:      SpO2: 100% 96% 96%   Weight:      Height:        Intake/Output  Summary (Last 24 hours) at 05/30/2023 0830 Last data filed at 05/30/2023 0727 Gross per 24 hour  Intake 237 ml  Output 2000 ml  Net -1763 ml   Filed Weights   05/27/23 0531 05/27/23 1219  Weight: 83.9 kg 89.2 kg    Examination:  General exam: Appears calm and comfortable.  Chronically ill and deconditioned looking.  On 2 L oxygen via nasal cannula Respiratory system: Bilateral decreased breath sounds at bases Cardiovascular system: S1 & S2 heard, Rate controlled Gastrointestinal system: Abdomen is nondistended, soft and nontender. Normal bowel sounds heard. G tube present. Periumbilical reducible hernia present which is nontender Extremities: No cyanosis, clubbing, edema  Central nervous system: Awake, slow to respond, poor historian.  No focal neurological deficits. Moving extremities Skin: No rashes, lesions or ulcers Psychiatry: Flat affect.  Not agitated.    Data Reviewed: I have personally reviewed following labs and imaging studies  CBC: Recent Labs  Lab 05/27/23 0545 05/27/23 1321 05/28/23 0106  WBC 7.2  --  8.6  NEUTROABS 6.0  --   --   HGB 8.5* 8.4* 8.2*  HCT 26.0* 25.4* 24.8*  MCV 97.4  --  96.5  PLT 172  --  182   Basic Metabolic Panel: Recent Labs  Lab 05/27/23 0545 05/28/23 0106  NA 138 139  K 3.9 3.9  CL 105 106  CO2 24 24  GLUCOSE 113* 269*  BUN 38* 38*  CREATININE 1.52* 1.15  CALCIUM 8.8* 8.6*   GFR: Estimated Creatinine Clearance: 59.9 mL/min (by C-G formula based on SCr of 1.15 mg/dL). Liver Function Tests: Recent Labs  Lab 05/27/23 0545 05/28/23 0106  AST 39 23  ALT 60* 44  ALKPHOS 148* 146*  BILITOT 0.7 0.3  PROT 6.7 6.4*  ALBUMIN 2.1* 2.0*   No results for input(s): "LIPASE", "AMYLASE" in the last 168 hours. No results for input(s): "AMMONIA" in the last 168 hours. Coagulation Profile: Recent Labs  Lab 05/27/23 0545  INR 1.1   Cardiac Enzymes: No results for input(s): "CKTOTAL", "CKMB", "CKMBINDEX", "TROPONINI" in the  last 168 hours. BNP (last 3 results) No results for input(s): "PROBNP" in the last 8760 hours. HbA1C: Recent Labs    05/28/23 0903  HGBA1C 6.2*   CBG: Recent Labs  Lab 05/29/23 0739 05/29/23 1231 05/29/23 1734 05/29/23 2052 05/30/23 0758  GLUCAP 273* 221* 242* 240* 198*   Lipid Profile: No results for input(s): "CHOL", "HDL", "LDLCALC", "TRIG", "CHOLHDL", "LDLDIRECT" in the last 72 hours. Thyroid Function Tests: No results for input(s): "TSH", "T4TOTAL", "FREET4", "T3FREE", "THYROIDAB" in the last 72 hours. Anemia Panel: No results for input(s): "VITAMINB12", "FOLATE", "FERRITIN", "TIBC", "IRON", "RETICCTPCT" in the last 72 hours. Sepsis Labs: Recent Labs  Lab 05/27/23 0543 05/27/23 0653 05/27/23 0733  PROCALCITON 5.60  --   --   LATICACIDVEN  --  1.4 1.1    Recent Results (from the past 240 hours)  Culture, blood (Routine x 2)     Status: None (Preliminary result)   Collection Time: 05/27/23  5:40 AM   Specimen: BLOOD RIGHT ARM  Result  Value Ref Range Status   Specimen Description BLOOD RIGHT ARM  Final   Special Requests   Final    BOTTLES DRAWN AEROBIC ONLY Blood Culture results may not be optimal due to an inadequate volume of blood received in culture bottles   Culture   Final    NO GROWTH 2 DAYS Performed at [facility], [address], [place], [place] [address]    Report Status PENDING  Incomplete  Culture, blood (Routine x 2)     Status: None (Preliminary result)   Collection Time: 05/27/23  5:43 AM   Specimen: BLOOD RIGHT ARM  Result Value Ref Range Status   Specimen Description BLOOD RIGHT ARM  Final   Special Requests   Final    BOTTLES DRAWN AEROBIC AND ANAEROBIC Blood Culture adequate volume   Culture   Final    NO GROWTH 2 DAYS Performed at [facility], [address] [place], [place] [address]    Report Status PENDING  Incomplete  SARS Coronavirus 2 by RT PCR (hospital order, performed in [facility] hospital lab) *cepheid  single result test* Anterior Nasal Swab     Status: None   Collection Time: 05/27/23  6:34 AM   Specimen: Anterior Nasal Swab  Result Value Ref Range Status   SARS Coronavirus 2 by RT PCR NEGATIVE NEGATIVE Final    Comment: Performed at [facility], [address] [place], [place] [address]  Respiratory (~20 pathogens) panel by PCR     Status: None   Collection Time: 05/27/23  9:56 AM   Specimen: Nasopharyngeal Swab; Respiratory  Result Value Ref Range Status   Adenovirus NOT DETECTED NOT DETECTED Final   Coronavirus 229E NOT DETECTED NOT DETECTED Final    Comment: (NOTE) The Coronavirus on the Respiratory Panel, DOES NOT test for the novel  Coronavirus (2019 nCoV)    Coronavirus HKU1 NOT DETECTED NOT DETECTED Final   Coronavirus NL63 NOT DETECTED NOT DETECTED Final   Coronavirus OC43 NOT DETECTED NOT DETECTED Final   Metapneumovirus NOT DETECTED NOT DETECTED Final   Rhinovirus / Enterovirus NOT DETECTED NOT DETECTED Final   Influenza A NOT DETECTED NOT DETECTED Final   Influenza B NOT DETECTED NOT DETECTED Final   Parainfluenza Virus 1 NOT DETECTED NOT DETECTED Final   Parainfluenza Virus 2 NOT DETECTED NOT DETECTED Final   Parainfluenza Virus 3 NOT DETECTED NOT DETECTED Final   Parainfluenza Virus 4 NOT DETECTED NOT DETECTED Final   Respiratory Syncytial Virus NOT DETECTED NOT DETECTED Final   Bordetella pertussis NOT DETECTED NOT DETECTED Final   Bordetella Parapertussis NOT DETECTED NOT DETECTED Final   Chlamydophila pneumoniae NOT DETECTED NOT DETECTED Final   Mycoplasma pneumoniae NOT DETECTED NOT DETECTED Final    Comment: Performed at [facility], [address] [place], [place] [address]  SARS Coronavirus 2 by RT PCR (hospital order, performed in [facility] hospital lab) *cepheid single result test* Anterior Nasal Swab     Status: None   Collection Time: 05/27/23 12:41 PM   Specimen: Anterior Nasal Swab  Result Value Ref Range Status   SARS Coronavirus 2  by RT PCR NEGATIVE NEGATIVE Final    Comment: Performed at [facility], [address], [place], [place] [address]  MRSA Next Gen by PCR, Nasal     Status: None   Collection Time: 05/27/23  1:39 PM   Specimen: Nasal Mucosa; Nasal Swab  Result Value Ref Range Status  MRSA by PCR Next Gen NOT DETECTED NOT DETECTED Final    Comment: (NOTE) The GeneXpert MRSA Assay (FDA approved for NASAL specimens only), is one component of a comprehensive MRSA colonization surveillance program. It is not intended to diagnose MRSA infection nor to guide or monitor treatment for MRSA infections. Test performance is not FDA approved in patients less than 35 years old. Performed at [facility], [address], [place], [place] [address]          Radiology Studies: No results found.      Scheduled Meds:  feeding supplement (JEVITY 1.5 CAL/FIBER)  237 mL Per Tube 5 X Daily   feeding supplement (PROSource TF20)  60 mL Per Tube Daily   ferrous sulfate  300 mg Per Tube Daily   free water  225 mL Per Tube Q6H   insulin aspart  0-15 Units Subcutaneous TID WC   insulin aspart  0-5 Units Subcutaneous QHS   levothyroxine  50 mcg Per Tube Q0600   lipase/protease/amylase)  20,880 Units Per Tube Once   And   sodium bicarbonate  650 mg Per Tube Once   loratadine  10 mg Per Tube Daily   midodrine  5 mg Per Tube TID WC   mouth rinse  15 mL Mouth Rinse 4 times per day   pantoprazole (PROTONIX) IV  40 mg Intravenous Q12H   polyethylene glycol  17 g Per Tube Daily   senna-docusate  1 tablet Per Tube BID   sodium chloride flush  3 mL Intravenous Q12H   Continuous Infusions:  azithromycin 500 mg (05/29/23 1005)   cefTRIAXone (ROCEPHIN)  IV 2 g (05/29/23 0847)          Glade Lloyd, MD [facility] 05/30/2023, 8:30 AM

## 2023-05-30 NOTE — Progress Notes (Signed)
Mobility Specialist Progress Note:    05/30/23 1100  Mobility  Activity Transferred from bed to chair  Level of Assistance +2 (takes two people)  Assistive Device MaxiMove  Activity Response Tolerated well  Mobility Referral Yes  Mobility visit 1 Mobility  Mobility Specialist Start Time (ACUTE ONLY) 1033  Mobility Specialist Stop Time (ACUTE ONLY) 1048  Mobility Specialist Time Calculation (min) (ACUTE ONLY) 15 min   Pt received in bed and agreeable. No complaints throughout transfer. Pt left in chair with call bell and RN present.  D'Vante Earlene Plater Mobility Specialist Please contact via Special educational needs teacher or Rehab office at [phone]

## 2023-05-30 NOTE — Care Management Important Message (Signed)
Important Message  Patient Details  Name: Daniel Bailey. MRN: [id number] Date of Birth: 1950/10/04   Important Message Given:  Yes - Medicare IM     Dorena Bodo 05/30/2023, 2:44 PM

## 2023-05-31 DIAGNOSIS — J9601 Acute respiratory failure with hypoxia: Secondary | ICD-10-CM | POA: Diagnosis not present

## 2023-05-31 LAB — COMPREHENSIVE METABOLIC PANEL
ALT: 22 U/L (ref 0–44)
AST: 12 U/L — ABNORMAL LOW (ref 15–41)
Albumin: 1.9 g/dL — ABNORMAL LOW (ref 3.5–5.0)
Alkaline Phosphatase: 129 U/L — ABNORMAL HIGH (ref 38–126)
Anion gap: 8 (ref 5–15)
BUN: 35 mg/dL — ABNORMAL HIGH (ref 8–23)
CO2: 30 mmol/L (ref 22–32)
Calcium: 8.3 mg/dL — ABNORMAL LOW (ref 8.9–10.3)
Chloride: 99 mmol/L (ref 98–111)
Creatinine, Ser: 0.83 mg/dL (ref 0.61–1.24)
GFR, Estimated: >60 mL/min (ref >60)
Glucose, Bld: 174 mg/dL — ABNORMAL HIGH (ref 70–99)
Potassium: 3.7 mmol/L (ref 3.5–5.1)
Sodium: 137 mmol/L (ref 135–145)
Total Bilirubin: 0.3 mg/dL (ref 0.0–1.2)
Total Protein: 6.3 g/dL — ABNORMAL LOW (ref 6.5–8.1)

## 2023-05-31 LAB — CBC WITH DIFFERENTIAL/PLATELET
Abs Immature Granulocytes: 0.37 10*3/uL — ABNORMAL HIGH (ref 0.00–0.07)
Basophils Absolute: 0 10*3/uL (ref 0.0–0.1)
Basophils Relative: 0 %
Eosinophils Absolute: 0.2 10*3/uL (ref 0.0–0.5)
Eosinophils Relative: 2 %
HCT: 26 % — ABNORMAL LOW (ref 39.0–52.0)
Hemoglobin: 8.4 g/dL — ABNORMAL LOW (ref 13.0–17.0)
Immature Granulocytes: 4 %
Lymphocytes Relative: 24 %
Lymphs Abs: 2.1 10*3/uL (ref 0.7–4.0)
MCH: 31.8 pg (ref 26.0–34.0)
MCHC: 32.3 g/dL (ref 30.0–36.0)
MCV: 98.5 fL (ref 80.0–100.0)
Monocytes Absolute: 0.7 10*3/uL (ref 0.1–1.0)
Monocytes Relative: 8 %
Neutro Abs: 5.3 10*3/uL (ref 1.7–7.7)
Neutrophils Relative %: 62 %
Platelets: 342 10*3/uL (ref 150–400)
RBC: 2.64 MIL/uL — ABNORMAL LOW (ref 4.22–5.81)
RDW: 16.5 % — ABNORMAL HIGH (ref 11.5–15.5)
WBC: 8.7 10*3/uL (ref 4.0–10.5)
nRBC: 0.2 % (ref 0.0–0.2)

## 2023-05-31 LAB — MAGNESIUM: Magnesium: 2.1 mg/dL (ref 1.7–2.4)

## 2023-05-31 LAB — GLUCOSE, CAPILLARY
Glucose-Capillary: 183 mg/dL — ABNORMAL HIGH (ref 70–99)
Glucose-Capillary: 177 mg/dL — ABNORMAL HIGH (ref 70–99)

## 2023-05-31 MED ORDER — CEFUROXIME AXETIL 500 MG PO TABS: 500.0000 mg | ORAL_TABLET | Freq: Two times a day (BID) | ORAL | 0 refills | Status: AC

## 2023-05-31 NOTE — Discharge Summary (Signed)
Physician Discharge Summary  Cardell Peach. NWG:[id number] DOB: Sep 24, 1950 DOA: 05/27/2023  PCP: [name], MD  Admit date: 05/27/2023 Discharge date: 05/31/2023  Admitted From: SNF Disposition: SNF  Recommendations for Outpatient Follow-up:  Follow up with SNF provider at earliest convenience  Recommend outpatient evaluation and follow-up with palliative care  follow up in ED if symptoms worsen or new appear   Home Health: No Equipment/Devices: None  Discharge Condition: Stable CODE STATUS: DNR Diet recommendation: Tube feeding diet as per dietary recommendations  Brief/Interim Summary: 73 yrs old male with medical history significant of hypertension, hyperlipidemia, chronic hypotension, dysphagia with recurrent aspiration s/p G tube, colon cancer, diabetes mellitus type 2,  intellectual disability, history of GI bleed thought secondary to esophagitis and/or gastritis presented with worsening shortness of breath and cough.  On presentation, patient was febrile and required 15 L oxygen via nonrebreather.  Chest x-ray showed patchy airspace opacification bilaterally.  Patient was initially started on IV remdesivir and Decadron because of positive COVID test.  Subsequently 2 COVID tests were negative remdesivir and Decadron discontinued.  He is currently on IV antibiotics.  During hospitalizing, his condition has improved and he is hemodynamically stable.  He will be discharged back to SNF today.  Discharge Diagnoses:   Acute respiratory failure with hypoxia Community-acquired pneumonia -Required oxygen by nonrebreather on presentation.  Chest x-ray showed patchy airspace opacification bilaterally. -Patient was initially started on IV remdesivir and Decadron because of positive COVID test.  Subsequently 2 COVID tests were negative remdesivir and Decadron discontinued.   -Currently on Rocephin and Zithromax.  Blood cultures have been negative so far. -Currently on 2 L oxygen via nasal  cannula intermittently.  Discharge patient back to SNF today on Ceftin via tube for 3 more days.   Anemia of chronic disease History of GI bleed -From chronic illnesses.  Hemoglobin  currently stable.  Monitor H&H intermittently.   Acute kidney injury -Baseline creatinine of 1.1-1.3.  Creatinine on admission 1.52.  Resolved.    Chronic hypotension -Blood pressure intermittently on the lower side.   Losartan on hold.  Outpatient follow-up   Abdominal pain -CT of abdomen and pelvis did not show any acute abnormality.  Improved  Dysphagia status post PEG tube Hypoalbuminemia -Continue tube feeds as per dietitian recommendations.  Continue free water.   Intellectual disability -Outpatient follow-up.  Fall precautions.  Has a legal guardian.   Hypothyroidism -Continue levothyroxine   GERD with history of esophagitis -Continue PPI   Obesity -Outpatient follow-up   Physical deconditioning -Patient is from long-term care facility.  PT recommended PT reevaluation at long-term care facility.      Discharge Instructions  Discharge Instructions     Increase activity slowly   Complete by: As directed       Allergies as of 05/31/2023   No Known Allergies      Medication List     STOP taking these medications    cefTRIAXone 2 g injection Commonly known as: ROCEPHIN   Lagevrio 200 MG Caps capsule Generic drug: molnupiravir EUA   losartan 25 MG tablet Commonly known as: COZAAR       TAKE these medications    acetaminophen 500 MG tablet Commonly known as: TYLENOL Place 2 tablets (1,000 mg total) into feeding tube every 8 (eight) hours as needed for moderate pain.   cefUROXime 500 MG tablet Commonly known as: CEFTIN Place 1 tablet (500 mg total) into feeding tube 2 (two) times daily with a meal for 3  days.   feeding supplement (JEVITY 1.5 CAL/FIBER) Liqd Place 237 mLs into feeding tube 5 (five) times daily.   ferrous sulfate 220 (44 Fe) MG/5ML  solution Place 7 mLs into feeding tube daily.   free water Soln Place 225 mLs into feeding tube every 6 (six) hours.   levothyroxine 50 MCG tablet Commonly known as: SYNTHROID Place 50 mcg into feeding tube daily before breakfast.   loratadine 10 MG tablet Commonly known as: CLARITIN Place 10 mg into feeding tube daily.   magnesium hydroxide 400 MG/5ML suspension Commonly known as: MILK OF MAGNESIA Place 30 mLs into feeding tube 2 (two) times daily as needed for moderate constipation.   midodrine 5 MG tablet Commonly known as: PROAMATINE Place 1 tablet (5 mg total) into feeding tube 3 (three) times daily with meals.   omeprazole 20 MG capsule Commonly known as: PRILOSEC Take 20 mg by mouth in the morning and at bedtime.   ondansetron 4 MG disintegrating tablet Commonly known as: ZOFRAN-ODT 4 mg See admin instructions. Give 4 mg per tube three times daily as needed for nausea and vomiting        Follow-up Information     Tower, Audrie Gallus, MD. Schedule an appointment as soon as possible for a visit in 1 week(s).   Specialties: Family Medicine, Radiology Contact information: [address] [place] [address] [phone]                No Known Allergies  Consultations: None   Procedures/Studies: CT ABDOMEN PELVIS WO CONTRAST Result Date: 05/27/2023 CLINICAL DATA:  Abdominal pain. Acute. Positive COVID test. Fever and vomiting. EXAM: CT ABDOMEN AND PELVIS WITHOUT CONTRAST TECHNIQUE: Multidetector CT imaging of the abdomen and pelvis was performed following the standard protocol without IV contrast. RADIATION DOSE REDUCTION: This exam was performed according to the departmental dose-optimization program which includes automated exposure control, adjustment of the mA and/or kV according to patient size and/or use of iterative reconstruction technique. COMPARISON:  01/09/2023 FINDINGS: Lower chest: Patchy areas of ground-glass attenuation identified within  the imaged portions of both lungs. Consolidative changes identified within the posterior left lower lobe. Hepatobiliary: Unchanged low-density lesions within caudate lobe and lateral segment of left lobe of liver. These likely represent cysts. No suspicious liver abnormality identified. Small stones noted layering within the dependent portion of the gallbladder. No signs of gallbladder wall inflammation or bile duct dilatation. Pancreas: Unremarkable. No pancreatic ductal dilatation or surrounding inflammatory changes. Spleen: Normal in size without focal abnormality. Adrenals/Urinary Tract: Normal adrenal glands. No signs of nephrolithiasis, hydronephrosis or mass. No focal bladder abnormality. Stomach/Bowel: Percutaneous gastrostomy tube is identified within the stomach. No pathologic dilatation of the large or small bowel loops. The cecum is identified along the midline of the abdomen. The appendix is visualized and appears normal. No bowel wall thickening, inflammation, or distension. Moderate retained stool identified within the rectum. Vascular/Lymphatic: Mild aortic atherosclerosis. No signs of aneurysm. No abdominopelvic adenopathy. Reproductive: No mass identified. Other: Small amount of fluid is identified overlying the spleen within the left upper quadrant of the abdomen, nonspecific, image 24/3. No focal fluid collections identified to suggest abscess. No signs of pneumoperitoneum. Fat containing umbilical hernia has a transverse diameter of 7 cm, image 61/3. There is also a fat containing right inguinal hernia which measures 8.1 cm, image 89/6. There is a trace amount of deep Linden fluid within the hernia sac as well as mild soft tissue stranding within the herniated fat. Musculoskeletal: No  acute or significant osseous findings. Scoliosis and degenerative disc disease. IMPRESSION: 1. Patchy areas of ground-glass attenuation identified within the imaged portions of both lungs. Consolidative changes  identified within the posterior left lower lobe. Findings are compatible with multifocal pneumonia. Atypical pneumonia not excluded. 2. Small amount of fluid is identified overlying the spleen within the left upper quadrant of the abdomen, nonspecific. No focal fluid collections identified to suggest abscess. 3. Fat containing umbilical hernia has a transverse diameter of 7 cm. There is also a fat containing right inguinal hernia which measures 8.1 cm. There is a trace amount of deep Linden fluid within the hernia sac as well as mild soft tissue stranding within the herniated fat. Correlate for any clinical signs or symptoms of incarceration. 4. Cholelithiasis. 5. Moderate retained stool identified within the rectum. 6.  Aortic Atherosclerosis (ICD10-I70.0). Electronically Signed   By: Signa Kell M.D.   On: 05/27/2023 12:49   DG Chest Portable 1 View Result Date: 05/27/2023 CLINICAL DATA:  Hypoxia.  COVID-19 positive. EXAM: PORTABLE CHEST 1 VIEW COMPARISON:  01/07/2023 FINDINGS: Lungs are adequately inflated demonstrate patchy hazy airspace opacification bilaterally likely representing viral pneumonia in this patient who is COVID-19 positive. No effusion or pneumothorax. Cardiomediastinal silhouette and remainder of the exam is unchanged. IMPRESSION: Patchy hazy airspace opacification bilaterally likely representing viral pneumonia in this patient who is COVID-19 positive. Electronically Signed   By: Elberta Fortis M.D.   On: 05/27/2023 07:59      Subjective: Patient seen and examined at bedside.  Wakes up slightly, extremely slow to respond and a poor historian.  No fever, seizures, agitation reported.  Discharge Exam: Vitals:   05/31/23 0541 05/31/23 0922  BP: (!) 117/55 (!) 105/59  Pulse: (!) 109 69  Resp: 18 18  Temp: 98.4 F (36.9 C) 98.6 F (37 C)  SpO2: 99% 98%    General: Pt is alert, awake, not in acute distress.  Chronically ill and deconditioned looking.  On 2 L oxygen via nasal  cannula intermittently. Cardiovascular: Tachycardic intermittently, S1/S2 + Respiratory: bilateral decreased breath sounds at bases with scattered crackles Abdominal: Soft, obese, NT, ND, bowel sounds +.  Periumbilical reducible hernia present with no tenderness Extremities: no edema, no cyanosis    The results of significant diagnostics from this hospitalization (including imaging, microbiology, ancillary and laboratory) are listed below for reference.     Microbiology: Recent Results (from the past 240 hours)  Culture, blood (Routine x 2)     Status: None (Preliminary result)   Collection Time: 05/27/23  5:40 AM   Specimen: BLOOD RIGHT ARM  Result Value Ref Range Status   Specimen Description BLOOD RIGHT ARM  Final   Special Requests   Final    BOTTLES DRAWN AEROBIC ONLY Blood Culture results may not be optimal due to an inadequate volume of blood received in culture bottles   Culture   Final    NO GROWTH 3 DAYS Performed at [facility], [address] [place], [place] [address]    Report Status PENDING  Incomplete  Culture, blood (Routine x 2)     Status: None (Preliminary result)   Collection Time: 05/27/23  5:43 AM   Specimen: BLOOD RIGHT ARM  Result Value Ref Range Status   Specimen Description BLOOD RIGHT ARM  Final   Special Requests   Final    BOTTLES DRAWN AEROBIC AND ANAEROBIC Blood Culture adequate volume   Culture   Final    NO GROWTH 3  DAYS Performed at [facility], [address], [place], [place] [address]    Report Status PENDING  Incomplete  SARS Coronavirus 2 by RT PCR (hospital order, performed in [facility] hospital lab) *cepheid single result test* Anterior Nasal Swab     Status: None   Collection Time: 05/27/23  6:34 AM   Specimen: Anterior Nasal Swab  Result Value Ref Range Status   SARS Coronavirus 2 by RT PCR NEGATIVE NEGATIVE Final    Comment: Performed at [facility], [address] [place], [place] [address]  Respiratory  (~20 pathogens) panel by PCR     Status: None   Collection Time: 05/27/23  9:56 AM   Specimen: Nasopharyngeal Swab; Respiratory  Result Value Ref Range Status   Adenovirus NOT DETECTED NOT DETECTED Final   Coronavirus 229E NOT DETECTED NOT DETECTED Final    Comment: (NOTE) The Coronavirus on the Respiratory Panel, DOES NOT test for the novel  Coronavirus (2019 nCoV)    Coronavirus HKU1 NOT DETECTED NOT DETECTED Final   Coronavirus NL63 NOT DETECTED NOT DETECTED Final   Coronavirus OC43 NOT DETECTED NOT DETECTED Final   Metapneumovirus NOT DETECTED NOT DETECTED Final   Rhinovirus / Enterovirus NOT DETECTED NOT DETECTED Final   Influenza A NOT DETECTED NOT DETECTED Final   Influenza B NOT DETECTED NOT DETECTED Final   Parainfluenza Virus 1 NOT DETECTED NOT DETECTED Final   Parainfluenza Virus 2 NOT DETECTED NOT DETECTED Final   Parainfluenza Virus 3 NOT DETECTED NOT DETECTED Final   Parainfluenza Virus 4 NOT DETECTED NOT DETECTED Final   Respiratory Syncytial Virus NOT DETECTED NOT DETECTED Final   Bordetella pertussis NOT DETECTED NOT DETECTED Final   Bordetella Parapertussis NOT DETECTED NOT DETECTED Final   Chlamydophila pneumoniae NOT DETECTED NOT DETECTED Final   Mycoplasma pneumoniae NOT DETECTED NOT DETECTED Final    Comment: Performed at [facility], [address], [place], [place] [address]  SARS Coronavirus 2 by RT PCR (hospital order, performed in [facility] hospital lab) *cepheid single result test* Anterior Nasal Swab     Status: None   Collection Time: 05/27/23 12:41 PM   Specimen: Anterior Nasal Swab  Result Value Ref Range Status   SARS Coronavirus 2 by RT PCR NEGATIVE NEGATIVE Final    Comment: Performed at [facility] Lab, [address], [place], [place] [address]  MRSA Next Gen by PCR, Nasal     Status: None   Collection Time: 05/27/23  1:39 PM   Specimen: Nasal Mucosa; Nasal Swab  Result Value Ref Range Status   MRSA by PCR Next Gen NOT DETECTED NOT  DETECTED Final    Comment: (NOTE) The GeneXpert MRSA Assay (FDA approved for NASAL specimens only), is one component of a comprehensive MRSA colonization surveillance program. It is not intended to diagnose MRSA infection nor to guide or monitor treatment for MRSA infections. Test performance is not FDA approved in patients less than 69 years old. Performed at [facility] Lab, [address], [place], [place] [address]      Labs: BNP (last 3 results) Recent Labs    05/27/23 0553  BNP 307.3*   Basic Metabolic Panel: Recent Labs  Lab 05/27/23 0545 05/28/23 0106 05/31/23 0343  NA 138 139 137  K 3.9 3.9 3.7  CL 105 106 99  CO2 24 24 30   GLUCOSE 113* 269* 174*  BUN 38* 38* 35*  CREATININE 1.52* 1.15 0.83  CALCIUM 8.8*  8.6* 8.3*  MG  --   --  2.1   Liver Function Tests: Recent Labs  Lab 05/27/23 0545 05/28/23 0106 05/31/23 0343  AST 39 23 12*  ALT 60* 44 22  ALKPHOS 148* 146* 129*  BILITOT 0.7 0.3 0.3  PROT 6.7 6.4* 6.3*  ALBUMIN 2.1* 2.0* 1.9*   No results for input(s): "LIPASE", "AMYLASE" in the last 168 hours. No results for input(s): "AMMONIA" in the last 168 hours. CBC: Recent Labs  Lab 05/27/23 0545 05/27/23 1321 05/28/23 0106 05/31/23 0343  WBC 7.2  --  8.6 8.7  NEUTROABS 6.0  --   --  5.3  HGB 8.5* 8.4* 8.2* 8.4*  HCT 26.0* 25.4* 24.8* 26.0*  MCV 97.4  --  96.5 98.5  PLT 172  --  182 342   Cardiac Enzymes: No results for input(s): "CKTOTAL", "CKMB", "CKMBINDEX", "TROPONINI" in the last 168 hours. BNP: Invalid input(s): "POCBNP" CBG: Recent Labs  Lab 05/30/23 0836 05/30/23 1136 05/30/23 1614 05/30/23 2151 05/31/23 0856  GLUCAP 170* 133* 187* 148* 183*   D-Dimer No results for input(s): "DDIMER" in the last 72 hours. Hgb A1c No results for input(s): "HGBA1C" in the last 72 hours. Lipid Profile No results for input(s): "CHOL", "HDL", "LDLCALC", "TRIG", "CHOLHDL", "LDLDIRECT" in the last 72 hours. Thyroid function studies No  results for input(s): "TSH", "T4TOTAL", "T3FREE", "THYROIDAB" in the last 72 hours.  Invalid input(s): "FREET3" Anemia work up No results for input(s): "VITAMINB12", "FOLATE", "FERRITIN", "TIBC", "IRON", "RETICCTPCT" in the last 72 hours. Urinalysis    Component Value Date/Time   COLORURINE YELLOW 05/27/2023 1405   APPEARANCEUR CLEAR 05/27/2023 1405   LABSPEC 1.019 05/27/2023 1405   PHURINE 5.0 05/27/2023 1405   GLUCOSEU NEGATIVE 05/27/2023 1405   HGBUR NEGATIVE 05/27/2023 1405   BILIRUBINUR NEGATIVE 05/27/2023 1405   BILIRUBINUR neg 08/10/2021 1616   KETONESUR NEGATIVE 05/27/2023 1405   PROTEINUR 100 (A) 05/27/2023 1405   UROBILINOGEN 0.2 08/10/2021 1616   NITRITE NEGATIVE 05/27/2023 1405   LEUKOCYTESUR NEGATIVE 05/27/2023 1405   Sepsis Labs Recent Labs  Lab 05/27/23 0545 05/28/23 0106 05/31/23 0343  WBC 7.2 8.6 8.7   Microbiology Recent Results (from the past 240 hours)  Culture, blood (Routine x 2)     Status: None (Preliminary result)   Collection Time: 05/27/23  5:40 AM   Specimen: BLOOD RIGHT ARM  Result Value Ref Range Status   Specimen Description BLOOD RIGHT ARM  Final   Special Requests   Final    BOTTLES DRAWN AEROBIC ONLY Blood Culture results may not be optimal due to an inadequate volume of blood received in culture bottles   Culture   Final    NO GROWTH 3 DAYS Performed at [facility], [address] [place], [place] [address]    Report Status PENDING  Incomplete  Culture, blood (Routine x 2)     Status: None (Preliminary result)   Collection Time: 05/27/23  5:43 AM   Specimen: BLOOD RIGHT ARM  Result Value Ref Range Status   Specimen Description BLOOD RIGHT ARM  Final   Special Requests   Final    BOTTLES DRAWN AEROBIC AND ANAEROBIC Blood Culture adequate volume   Culture   Final    NO GROWTH 3 DAYS Performed at [facility], [address], [place], [place] [address]    Report Status PENDING  Incomplete  SARS Coronavirus 2 by RT  PCR (hospital order, performed in [facility] hospital lab) *cepheid single  result test* Anterior Nasal Swab     Status: None   Collection Time: 05/27/23  6:34 AM   Specimen: Anterior Nasal Swab  Result Value Ref Range Status   SARS Coronavirus 2 by RT PCR NEGATIVE NEGATIVE Final    Comment: Performed at [facility], [address], [place], [place] [address]  Respiratory (~20 pathogens) panel by PCR     Status: None   Collection Time: 05/27/23  9:56 AM   Specimen: Nasopharyngeal Swab; Respiratory  Result Value Ref Range Status   Adenovirus NOT DETECTED NOT DETECTED Final   Coronavirus 229E NOT DETECTED NOT DETECTED Final    Comment: (NOTE) The Coronavirus on the Respiratory Panel, DOES NOT test for the novel  Coronavirus (2019 nCoV)    Coronavirus HKU1 NOT DETECTED NOT DETECTED Final   Coronavirus NL63 NOT DETECTED NOT DETECTED Final   Coronavirus OC43 NOT DETECTED NOT DETECTED Final   Metapneumovirus NOT DETECTED NOT DETECTED Final   Rhinovirus / Enterovirus NOT DETECTED NOT DETECTED Final   Influenza A NOT DETECTED NOT DETECTED Final   Influenza B NOT DETECTED NOT DETECTED Final   Parainfluenza Virus 1 NOT DETECTED NOT DETECTED Final   Parainfluenza Virus 2 NOT DETECTED NOT DETECTED Final   Parainfluenza Virus 3 NOT DETECTED NOT DETECTED Final   Parainfluenza Virus 4 NOT DETECTED NOT DETECTED Final   Respiratory Syncytial Virus NOT DETECTED NOT DETECTED Final   Bordetella pertussis NOT DETECTED NOT DETECTED Final   Bordetella Parapertussis NOT DETECTED NOT DETECTED Final   Chlamydophila pneumoniae NOT DETECTED NOT DETECTED Final   Mycoplasma pneumoniae NOT DETECTED NOT DETECTED Final    Comment: Performed at [facility], [address], [place], [place] [address]  SARS Coronavirus 2 by RT PCR (hospital order, performed in [facility] hospital lab) *cepheid single result test* Anterior Nasal Swab     Status: None   Collection Time: 05/27/23 12:41 PM   Specimen:  Anterior Nasal Swab  Result Value Ref Range Status   SARS Coronavirus 2 by RT PCR NEGATIVE NEGATIVE Final    Comment: Performed at [facility] Lab, [address] [place], [place] [address]  MRSA Next Gen by PCR, Nasal     Status: None   Collection Time: 05/27/23  1:39 PM   Specimen: Nasal Mucosa; Nasal Swab  Result Value Ref Range Status   MRSA by PCR Next Gen NOT DETECTED NOT DETECTED Final    Comment: (NOTE) The GeneXpert MRSA Assay (FDA approved for NASAL specimens only), is one component of a comprehensive MRSA colonization surveillance program. It is not intended to diagnose MRSA infection nor to guide or monitor treatment for MRSA infections. Test performance is not FDA approved in patients less than 46 years old. Performed at [facility] Lab, [address] [place], [place] [address]      Time coordinating discharge: 35 minutes  SIGNED:   Glade Lloyd, MD  [facility] 05/31/2023, 9:57 AM

## 2023-05-31 NOTE — TOC Transition Note (Signed)
Transition of Care [facility]) - Discharge Note   Patient Details  Name: Daniel Bailey. MRN: [id number] Date of Birth: 1951-03-09  [facility] CM/SW Contact:  Erin Sons, LCSW Phone Number: 05/31/2023, 11:24 AM   Clinical Narrative:     Per MD patient ready for DC to [facility]. RN, patient, patient's family, and facility notified of DC. Discharge Summary and FL2 sent to facility. RN to call report prior to discharge [phone]). DC packet on chart. Ambulance transport requested for patient.   CSW will sign off for now as social work intervention is no longer needed. Please consult [place] again if new needs arise.   Final next level of care: Skilled Nursing Facility Barriers to Discharge: No Barriers Identified   Patient Goals and CMS Choice Patient states their goals for this hospitalization and ongoing recovery are:: Patient has legal guardian          Discharge Placement              Patient [facility] bed at: [facility] Patient to be transferred to facility by: [facility] Name of family member notified: Slade,[name] (LEGAL GUARDIAN) (Relative)  [phone] (Home Phone) Patient and family notified of of transfer: 05/31/23  Discharge Plan and Services Additional resources added to the After Visit Summary for   In-house Referral: Clinical Social Work                                   Social Drivers of Health (SDOH) Interventions SDOH Screenings   Food Insecurity: No Food Insecurity (05/27/2023)  Housing: Low Risk  (05/27/2023)  Transportation Needs: No Transportation Needs (05/27/2023)  Utilities: Not At Risk (05/27/2023)  Alcohol Screen: Low Risk  (03/21/2022)  Depression (PHQ2-9): Low Risk  (03/21/2022)  Financial Resource Strain: Low Risk  (03/21/2022)  Physical Activity: Insufficiently Active (03/21/2022)  Social Connections: Socially Isolated (05/27/2023)  Stress: No Stress Concern Present (03/21/2022)  Tobacco Use: Low Risk  (05/27/2023)      Readmission Risk Interventions     No data to display

## 2023-06-01 LAB — CULTURE, BLOOD (ROUTINE X 2)
Culture: NO GROWTH
Culture: NO GROWTH
Special Requests: ADEQUATE

## 2023-08-17 ENCOUNTER — Telehealth: Payer: Self-pay

## 2023-08-17 NOTE — Telephone Encounter (Signed)
 Chart updated as requested.  Pt marked as deceased on 09-14-23.

## 2023-08-17 NOTE — Telephone Encounter (Signed)
 Please reach out to [facility] when you can -sounds like Daniel Bailey passed away - I cannot see anything in the chart

## 2023-08-17 NOTE — Transitions of Care (Post Inpatient/ED Visit) (Signed)
   08/17/2023  Name: Daniel Bailey. MRN: [id number] DOB: 01-05-1951  Today's TOC FU Call Status: Today's TOC FU Call Status:: Unsuccessful Call (1st Attempt) Unsuccessful Call (1st Attempt) Date: 08/17/23 Patient's Name and Date of Birth confirmed.  Transition Care Management Follow-up Telephone Call    Items Reviewed:    Medications Reviewed Today: Medications Reviewed Today   Medications were not reviewed in this encounter     Home Care and Equipment/Supplies:    Functional Questionnaire:    Follow up appointments reviewed:   Per [name] patient deceased on 18-Aug-2023   SIGNATURE Karena Addison, LPN [facility] Nurse Health Advisor Direct Dial [phone]

## 2023-08-17 NOTE — Telephone Encounter (Signed)
 See prev comments. Will also route to [place] to update pt's chart since he passed away

## 2023-08-17 NOTE — Telephone Encounter (Signed)
 Called Nicholaus Bloom and she said that Wednesday night pt vomited a few times. The nurse had planned on getting a portable chest xray the next morning (Thursday) to make sure he didn't aspirate because that's their protocol. When the CNA came in Thursday morning to get pt cleaned up and ready for the morning pt was coughing a lot and then vomited again. The CNA went to get the nurse to check him out, and when they came back pt had passed away yesterday morning. Nicholaus Bloom isn't sure the exact time but she knows it was early in the morning yesterday 08/30/2023, because they get everyone ready before breakfast

## 2023-09-06 DEATH — deceased

## 2023-11-19 IMAGING — CT CT HEAD W/O CM
4 series · 17 of 47 positions shown, 19 images · non-contrast
Comparison: 07/12/2021

CLINICAL DATA: Mental status changes



[Series 2: head wo · axial · 0.38mm/px · z∈[+1302,+1430]mm · 7 of 36 slices shown, 9 images]
[im 5/36  brain]
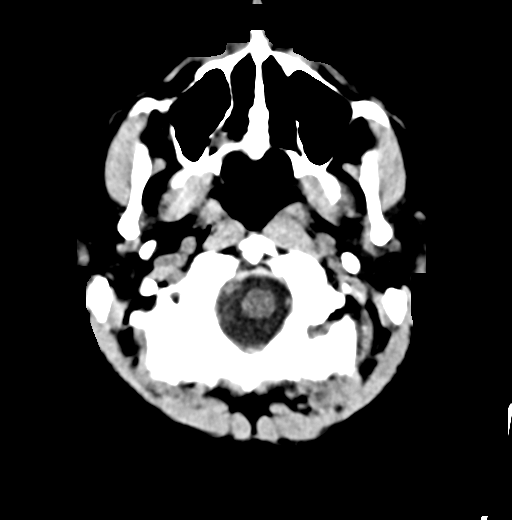
[im 5/36  bone]
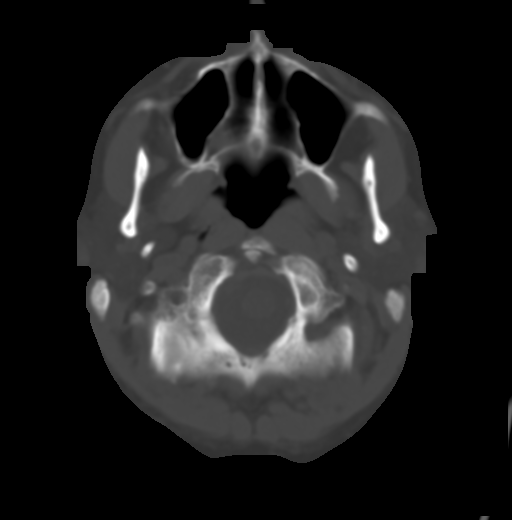
[im 9/36  brain]
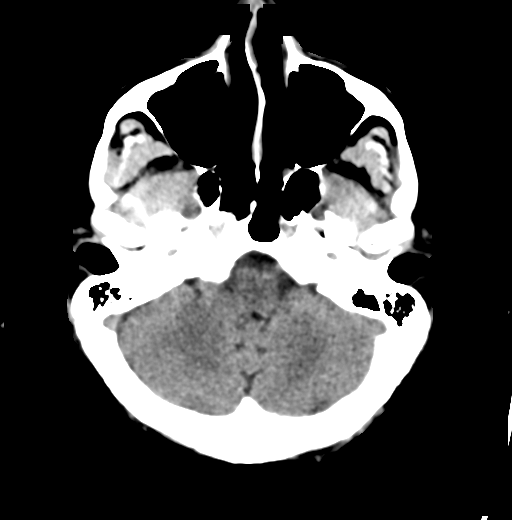
[im 14/36  brain]
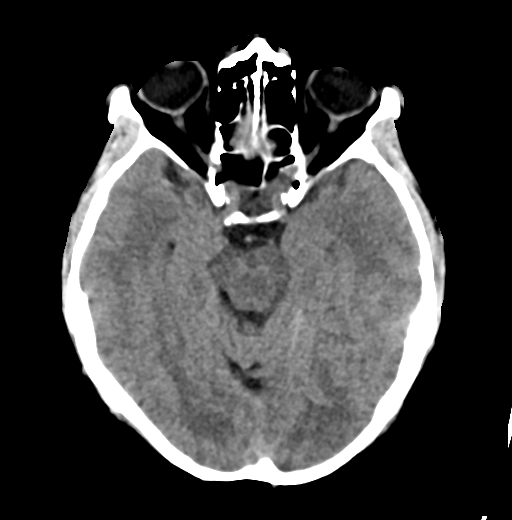
[im 18/36  brain]
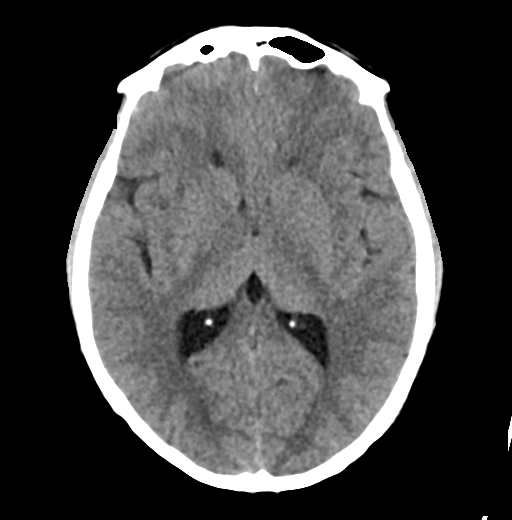
[im 22/36  brain]
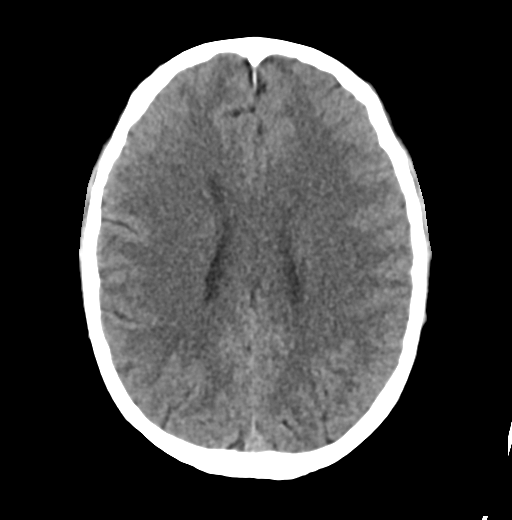
[im 22/36  bone]
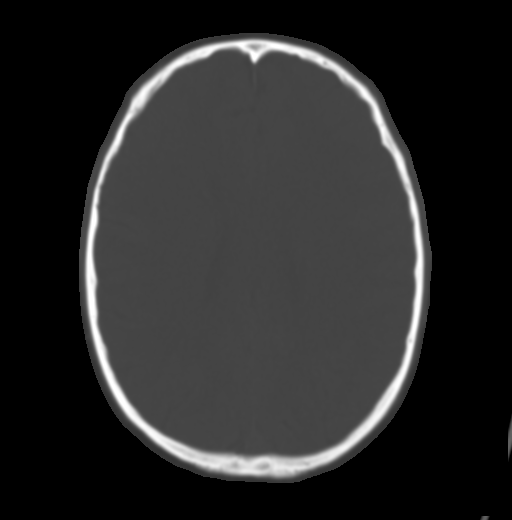
[im 27/36  brain]
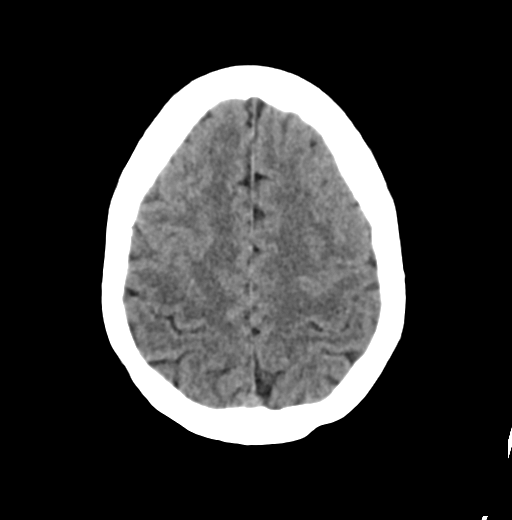
[im 31/36  brain]
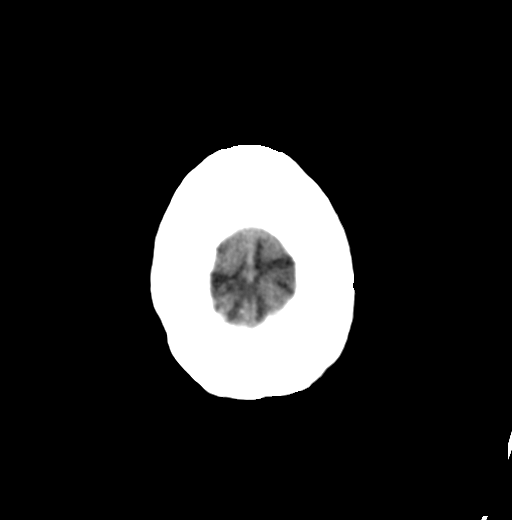

[Series 3: head bone · axial · 0.41mm/px · z∈[+1288,+1346]mm · 4 of 84 slices shown]
[im 9/84  bone]
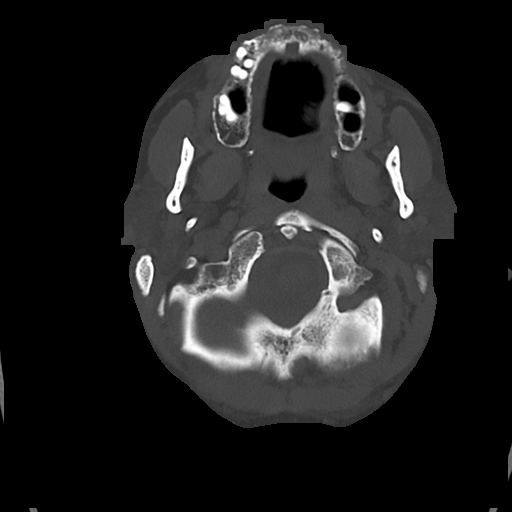
[im 17/84  bone]
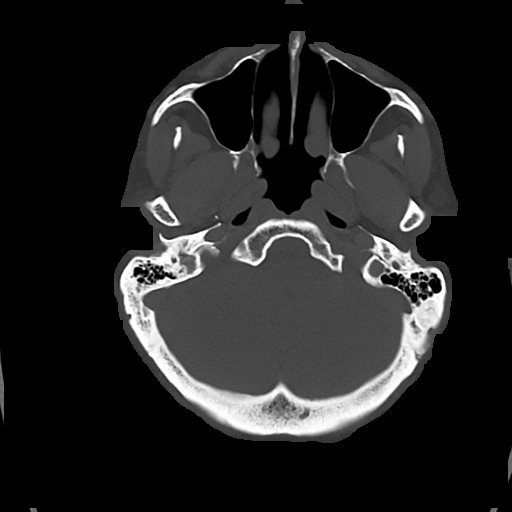
[im 25/84  bone]
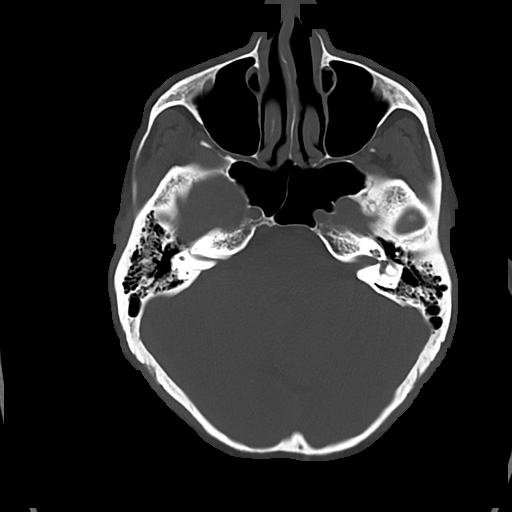
[im 38/84  bone]
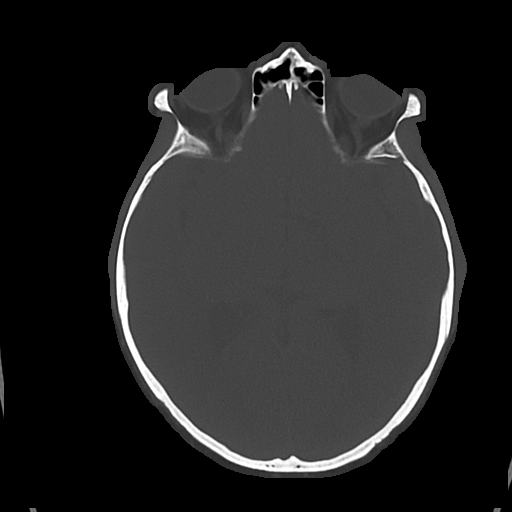

[Series 4: cor soft · coronal · 0.35mm/px · 3 of 66 slices shown]
[im 22/66  brain]
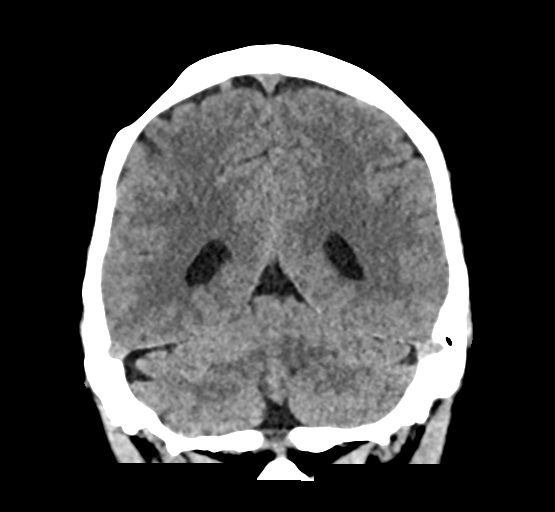
[im 29/66  brain]
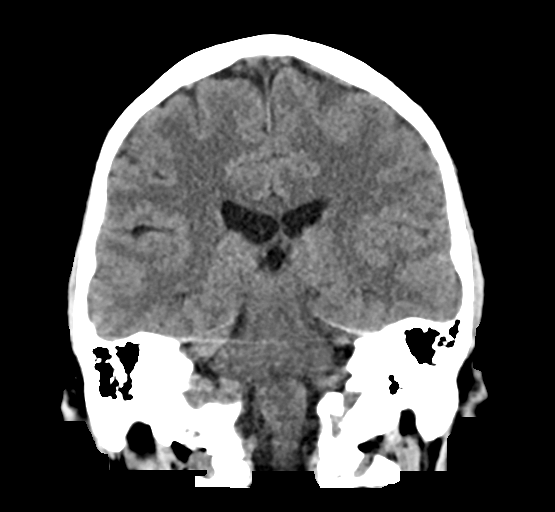
[im 37/66  brain]
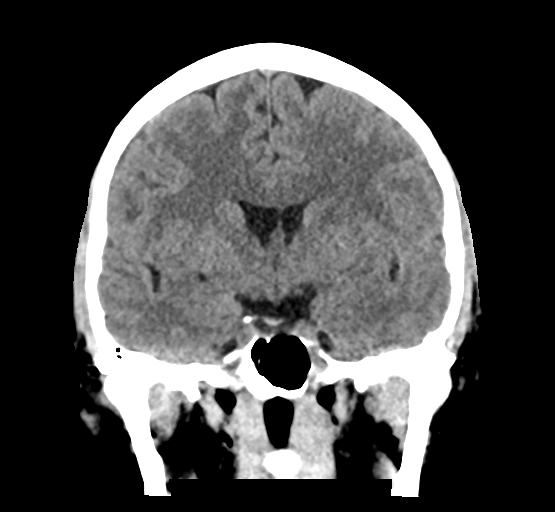

[Series 5: sag soft · sagittal · 0.35mm/px · 3 of 59 slices shown]
[im 20/59  brain]
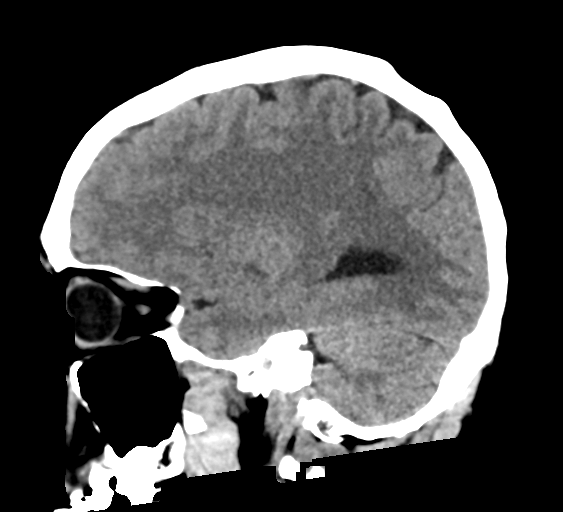
[im 30/59  brain]
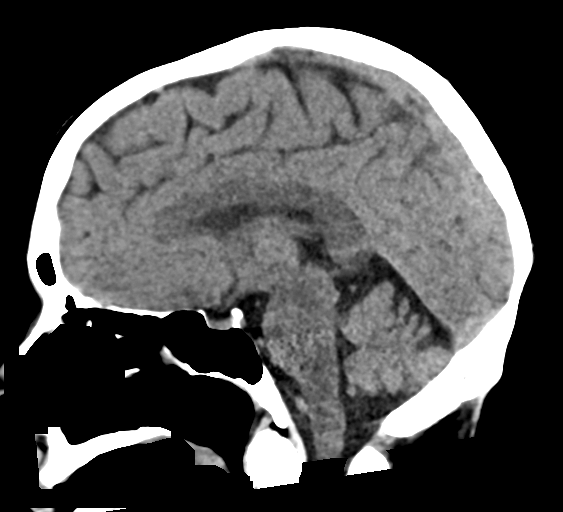
[im 39/59  brain]
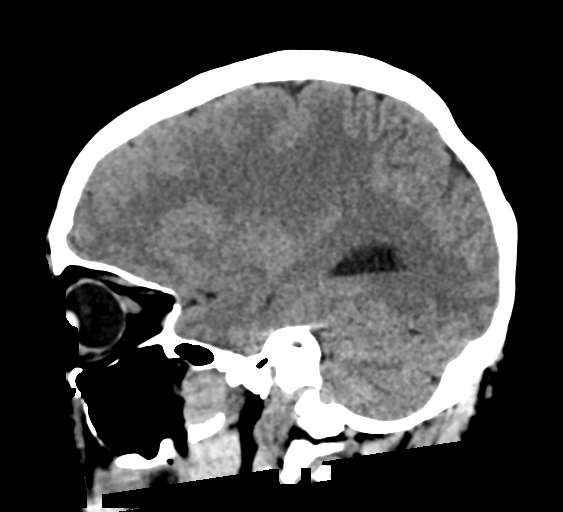

[17 of 47 positions shown; findings below may reference images not displayed]

FINDINGS: Brain: No evidence of acute infarction, hemorrhage, hydrocephalus,
extra-axial collection or mass lesion/mass effect.

Vascular: No hyperdense vessel or unexpected calcification.

Skull: Normal. Negative for fracture or focal lesion.

Sinuses/Orbits: Paranasal sinuses and mastoid air cells are clear.

Other: None
IMPRESSION: 1. No acute intracranial abnormalities.
2. Normal brain for age.

## 2023-11-19 IMAGING — DX DG CHEST 1V PORT
1 series · 1 of 1 positions shown · non-contrast
Comparison: July 12, 2021 and CT of the chest on the same day

CLINICAL DATA: Cough

EXAM:
PORTABLE CHEST 1 VIEW

[chest]
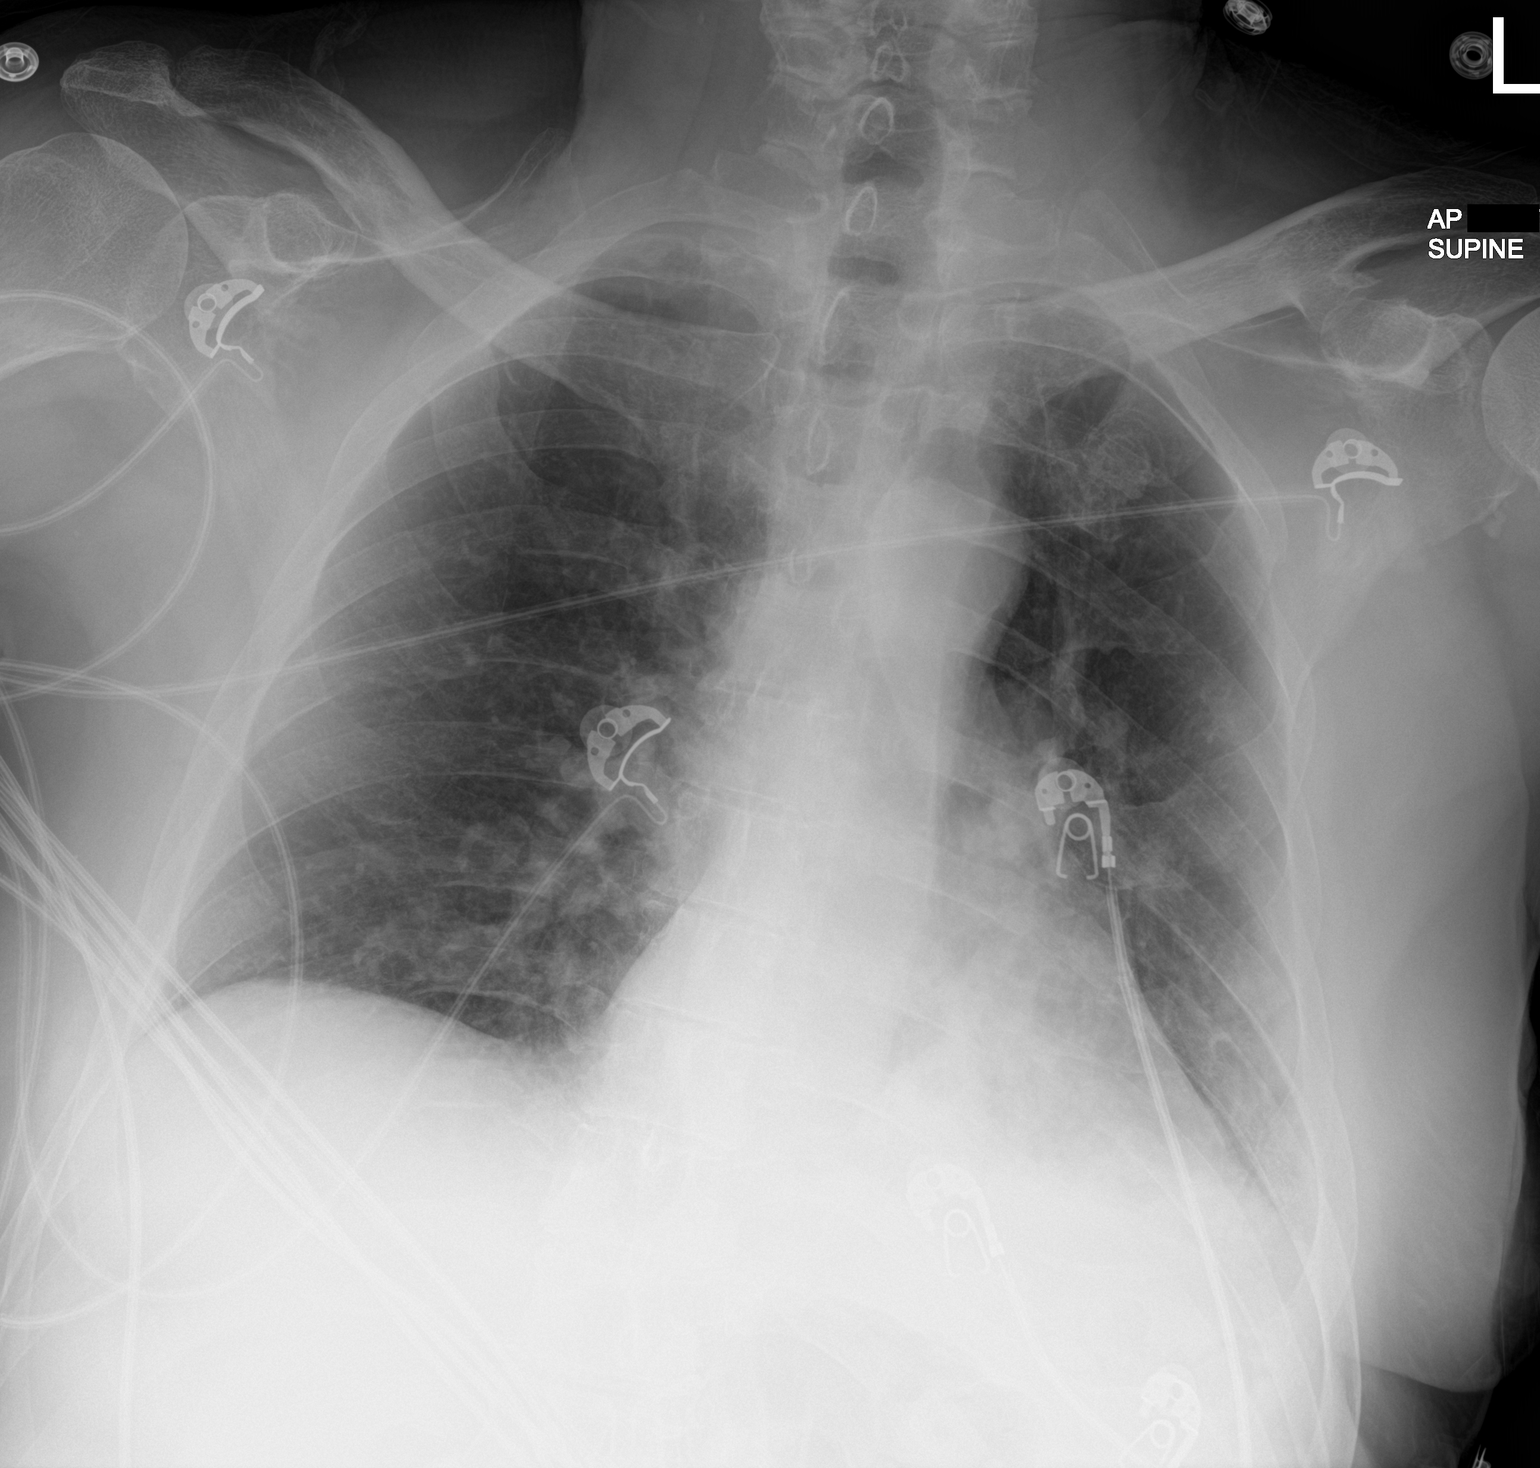

[1 of 1 positions shown; findings below may reference images not displayed]

FINDINGS: Heart is borderline. As before, the heart size and mediastinal
contours are within normal limits. Again seen is the retrocardiac
infiltrate without significant interval change. Visualized skeletal
structures are unremarkable.
IMPRESSION: Retrocardiac infiltrate and was seen in the previous study as well.

## 2023-11-19 IMAGING — MR MR HEAD W/O CM
8 of 10 series · 38 of 48 positions shown · non-contrast
Comparison: No prior MRI, correlation is made with CT head
09/02/2021

CLINICAL DATA: Altered mental status

EXAM:
MRI HEAD WITHOUT CONTRAST
TECHNIQUE: Multiplanar, multiecho pulse sequences of the brain and surrounding
structures were obtained without intravenous contrast.

[Series 3: DWI · axial · 3.0mm · 1.09mm/px · z∈[-65,+70]mm · 9 of 94 slices shown (1 of 4)]
[im 1/94]
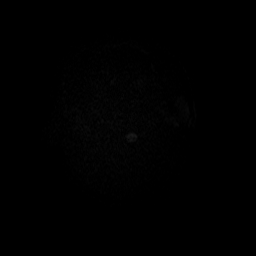
[im 17/94]
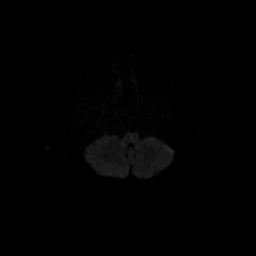
[im 26/94]
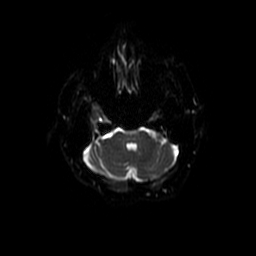
[im 43/94]
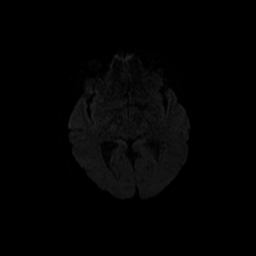
[im 51/94]
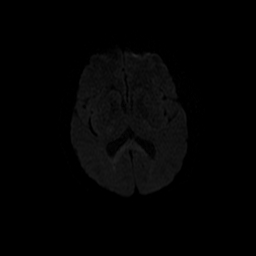
[im 68/94]
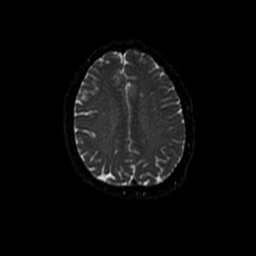
[im 77/94]
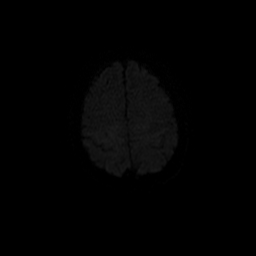
[im 85/94]
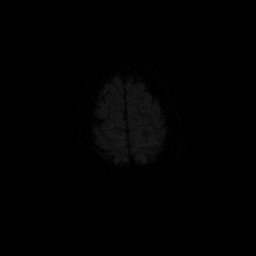
[im 94/94]
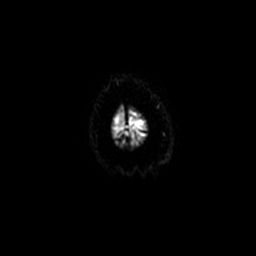

[Series 4: DWI · coronal · 5.0mm · 1.09mm/px · 8 of 66 slices shown (2 of 4)]
[im 1/66]
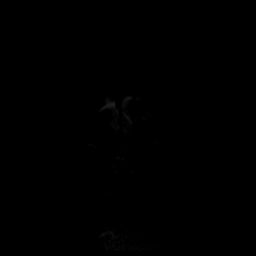
[im 10/66]
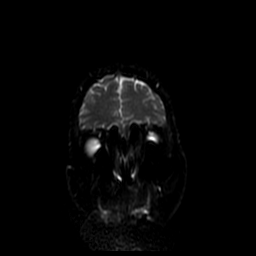
[im 19/66]
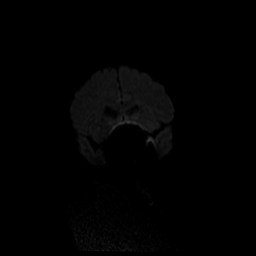
[im 28/66]
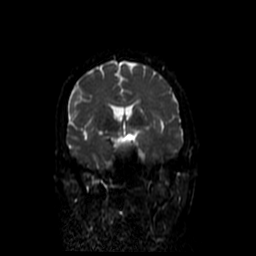
[im 38/66]
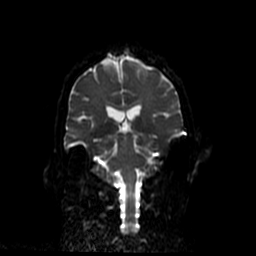
[im 47/66]
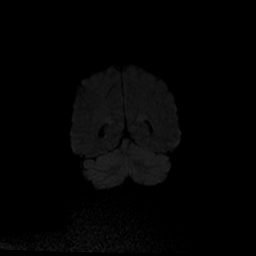
[im 56/66]
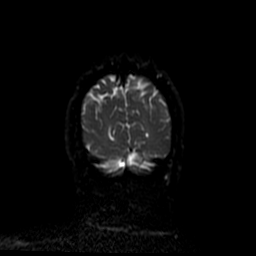
[im 66/66]
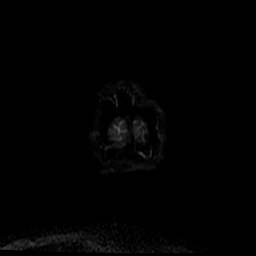

[Series 6: T2 · axial · 5.0mm · 0.43mm/px · z∈[-64,+77]mm · 3 of 25 slices shown (1 of 2)]
[im 1/25]
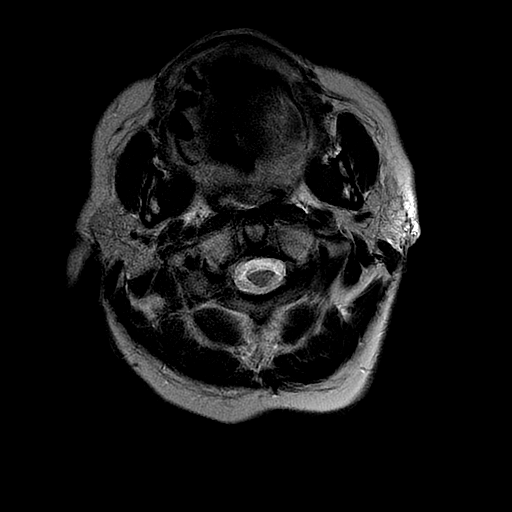
[im 13/25]
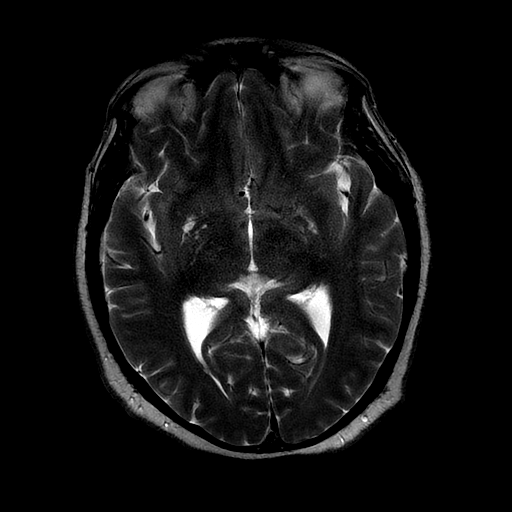
[im 25/25]
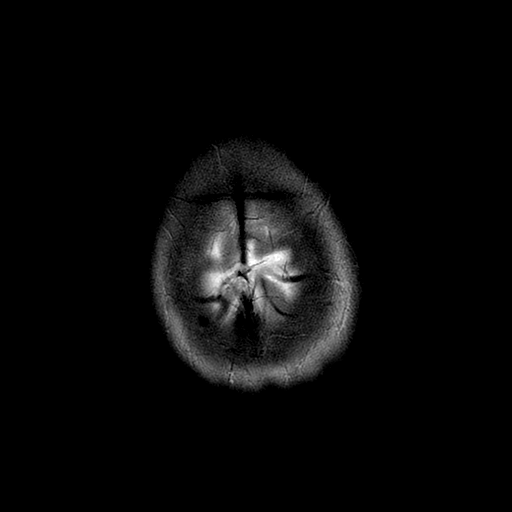

[Series 7: FLAIR · axial · 3.0mm · 0.43mm/px · z∈[-61,+80]mm · 3 of 25 slices shown (1 of 2)]
[im 1/25]
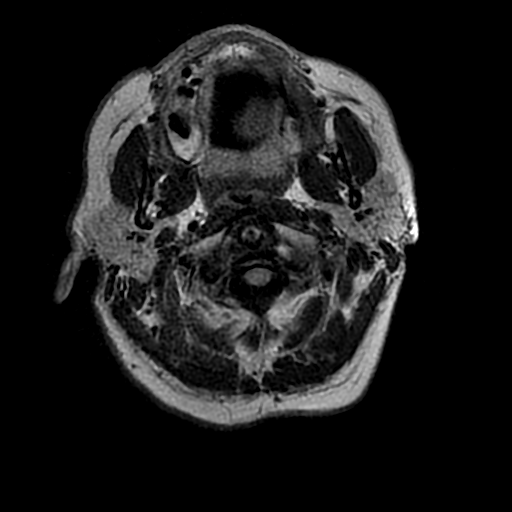
[im 13/25]
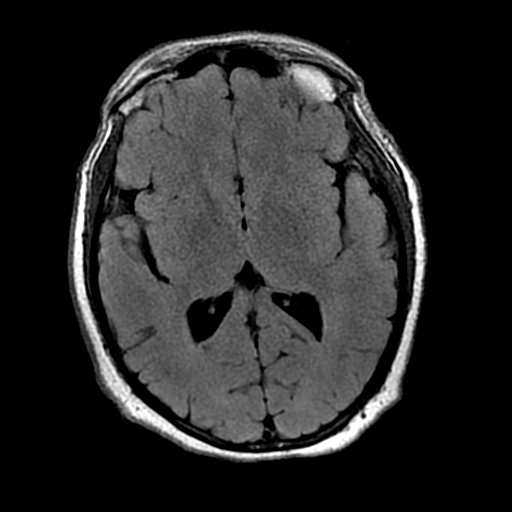
[im 25/25]
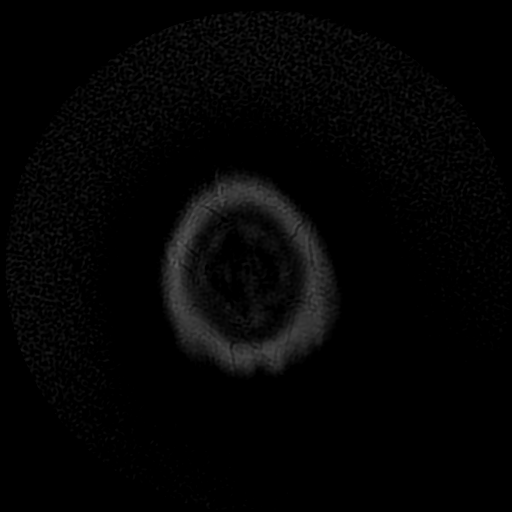

[Series 9: FLAIR · axial · 5.0mm · 0.43mm/px · z∈[-64,+77]mm · 3 of 25 slices shown (2 of 2)]
[im 1/25]
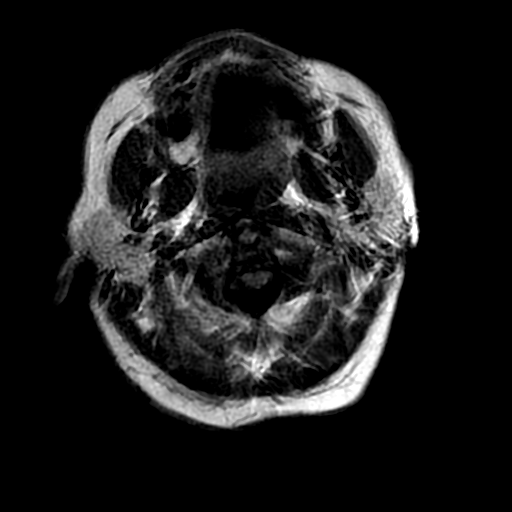
[im 13/25]
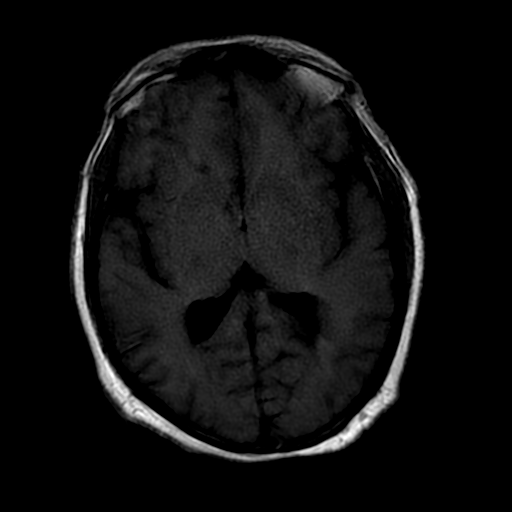
[im 25/25]
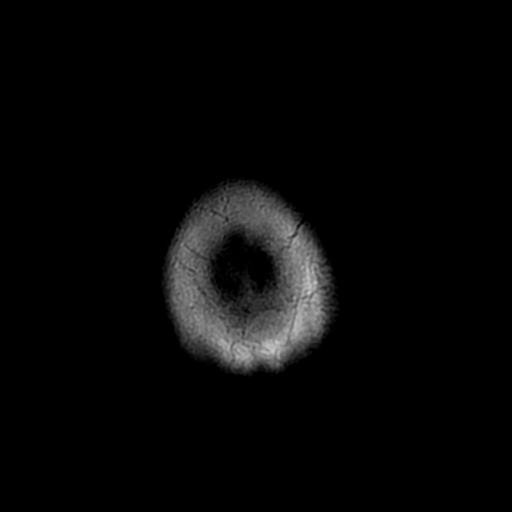

[Series 10: T2 · coronal · 5.0mm · 0.43mm/px · 2 of 27 slices shown (2 of 2)]
[im 1/27]
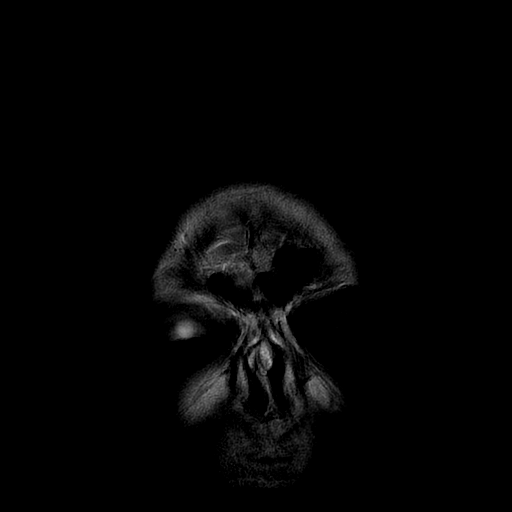
[im 14/27]
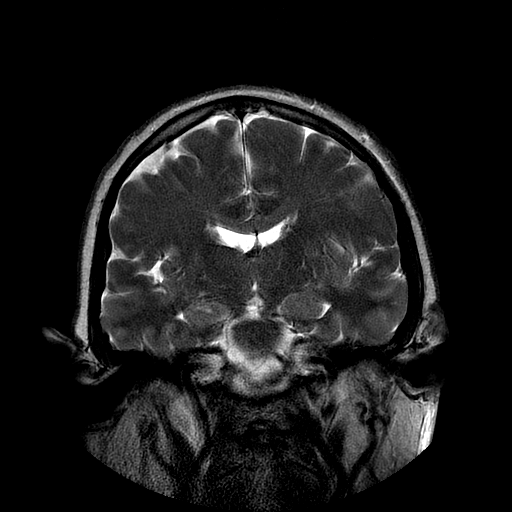

[Series 300: DWI · axial · 3.0mm · 1.09mm/px · z∈[-65,+70]mm · 6 of 47 slices shown (3 of 4)]
[im 1/47]
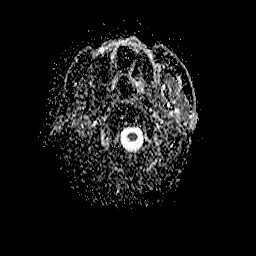
[im 10/47]
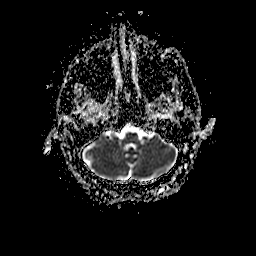
[im 19/47]
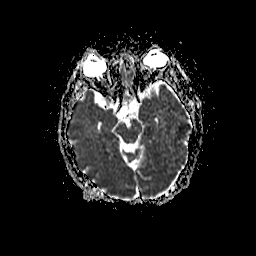
[im 28/47]
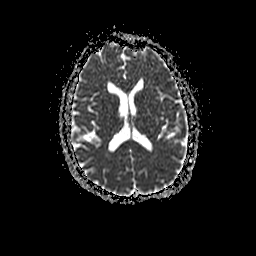
[im 37/47]
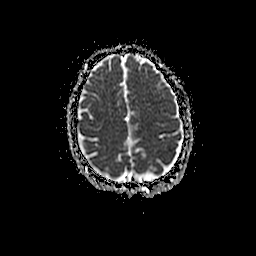
[im 47/47]
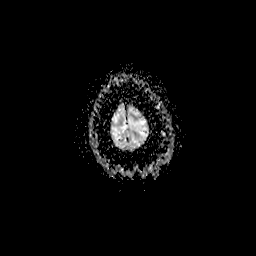

[Series 400: DWI · coronal · 5.0mm · 1.09mm/px · 4 of 32 slices shown (4 of 4)]
[im 1/32]
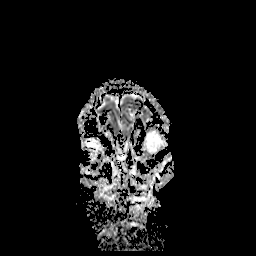
[im 11/32]
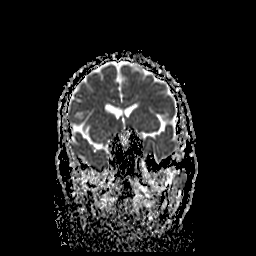
[im 21/32]
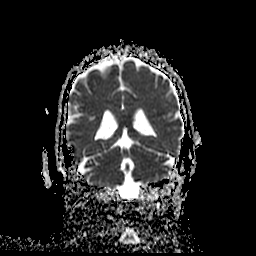
[im 32/32]
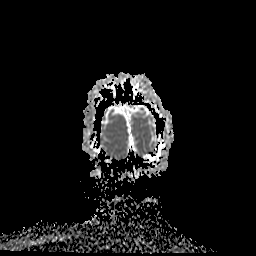

[38 of 48 positions shown; findings below may reference images not displayed]

FINDINGS: Evaluation is somewhat limited by motion artifact.

Brain: No restricted diffusion to suggest acute or subacute infarct.
No acute hemorrhage, mass, mass effect, or midline shift. No
hydrocephalus or extra-axial collection. No foci of hemosiderin
deposition to suggest remote hemorrhage. Scattered T2 hyperintense
signal in the periventricular white matter, likely the sequela of
mild chronic small vessel ischemic disease.

Vascular: Normal flow voids.

Skull and upper cervical spine: Normal marrow signal.

Sinuses/Orbits: Clear paranasal sinuses. The orbits are
unremarkable.

Other: None.
IMPRESSION: No acute intracranial process.
